# Patient Record
Sex: Male | Born: 1945 | Race: White | Hispanic: No | Marital: Married | Smoking: Never smoker
Health system: Southern US, Community
[De-identification: ages and names within clinical notes are randomized; demographics above are authoritative.]

## PROBLEM LIST (undated history)

## (undated) ENCOUNTER — Ambulatory Visit: Admission: EM | Payer: PRIVATE HEALTH INSURANCE | Source: Home / Self Care

## (undated) DIAGNOSIS — I10 Essential (primary) hypertension: Secondary | ICD-10-CM

## (undated) DIAGNOSIS — L570 Actinic keratosis: Secondary | ICD-10-CM

## (undated) DIAGNOSIS — G629 Polyneuropathy, unspecified: Secondary | ICD-10-CM

## (undated) DIAGNOSIS — E785 Hyperlipidemia, unspecified: Secondary | ICD-10-CM

## (undated) DIAGNOSIS — I255 Ischemic cardiomyopathy: Secondary | ICD-10-CM

## (undated) DIAGNOSIS — N183 Chronic kidney disease, stage 3 unspecified: Secondary | ICD-10-CM

## (undated) DIAGNOSIS — N200 Calculus of kidney: Secondary | ICD-10-CM

## (undated) DIAGNOSIS — K219 Gastro-esophageal reflux disease without esophagitis: Secondary | ICD-10-CM

## (undated) DIAGNOSIS — N320 Bladder-neck obstruction: Secondary | ICD-10-CM

## (undated) DIAGNOSIS — N529 Male erectile dysfunction, unspecified: Secondary | ICD-10-CM

## (undated) DIAGNOSIS — R351 Nocturia: Secondary | ICD-10-CM

## (undated) DIAGNOSIS — M778 Other enthesopathies, not elsewhere classified: Secondary | ICD-10-CM

## (undated) DIAGNOSIS — M1990 Unspecified osteoarthritis, unspecified site: Secondary | ICD-10-CM

## (undated) DIAGNOSIS — C61 Malignant neoplasm of prostate: Secondary | ICD-10-CM

## (undated) DIAGNOSIS — R32 Unspecified urinary incontinence: Secondary | ICD-10-CM

## (undated) DIAGNOSIS — R42 Dizziness and giddiness: Secondary | ICD-10-CM

## (undated) DIAGNOSIS — I82409 Acute embolism and thrombosis of unspecified deep veins of unspecified lower extremity: Secondary | ICD-10-CM

## (undated) DIAGNOSIS — C4491 Basal cell carcinoma of skin, unspecified: Secondary | ICD-10-CM

## (undated) DIAGNOSIS — H269 Unspecified cataract: Secondary | ICD-10-CM

## (undated) DIAGNOSIS — C679 Malignant neoplasm of bladder, unspecified: Secondary | ICD-10-CM

## (undated) DIAGNOSIS — Z86711 Personal history of pulmonary embolism: Secondary | ICD-10-CM

## (undated) DIAGNOSIS — I2510 Atherosclerotic heart disease of native coronary artery without angina pectoris: Secondary | ICD-10-CM

## (undated) DIAGNOSIS — E039 Hypothyroidism, unspecified: Secondary | ICD-10-CM

## (undated) DIAGNOSIS — Z973 Presence of spectacles and contact lenses: Secondary | ICD-10-CM

## (undated) DIAGNOSIS — IMO0001 Reserved for inherently not codable concepts without codable children: Secondary | ICD-10-CM

## (undated) HISTORY — DX: Basal cell carcinoma of skin, unspecified: C44.91

## (undated) HISTORY — DX: Actinic keratosis: L57.0

## (undated) HISTORY — PX: COLONOSCOPY: SHX174

## (undated) HISTORY — PX: PROSTATE CRYOABLATION: SUR358

---

## 2004-12-06 ENCOUNTER — Emergency Department: Payer: Self-pay | Admitting: Emergency Medicine

## 2006-03-21 ENCOUNTER — Other Ambulatory Visit: Payer: Self-pay

## 2006-03-24 ENCOUNTER — Ambulatory Visit: Payer: Self-pay | Admitting: Specialist

## 2007-01-15 ENCOUNTER — Ambulatory Visit: Payer: Self-pay | Admitting: Unknown Physician Specialty

## 2009-01-10 ENCOUNTER — Emergency Department: Payer: Self-pay | Admitting: Emergency Medicine

## 2012-03-18 DIAGNOSIS — R399 Unspecified symptoms and signs involving the genitourinary system: Secondary | ICD-10-CM | POA: Insufficient documentation

## 2012-03-18 DIAGNOSIS — N200 Calculus of kidney: Secondary | ICD-10-CM | POA: Insufficient documentation

## 2012-05-18 ENCOUNTER — Ambulatory Visit: Payer: Self-pay | Admitting: Unknown Physician Specialty

## 2012-09-19 HISTORY — PX: OTHER SURGICAL HISTORY: SHX169

## 2013-11-19 ENCOUNTER — Other Ambulatory Visit: Payer: Self-pay | Admitting: Urology

## 2013-12-02 ENCOUNTER — Encounter (HOSPITAL_BASED_OUTPATIENT_CLINIC_OR_DEPARTMENT_OTHER): Payer: Self-pay | Admitting: *Deleted

## 2013-12-02 NOTE — Progress Notes (Signed)
NPO AFTER MN. ARRIVE AT 0830. NEEDS ISTAT AND EKG. WILL TAKE PRILOSEC AM DOS W/ SIPS OF WATER.  PT AWARE OWER AT MAIN.

## 2013-12-06 ENCOUNTER — Encounter (HOSPITAL_BASED_OUTPATIENT_CLINIC_OR_DEPARTMENT_OTHER): Payer: Self-pay | Admitting: *Deleted

## 2013-12-06 ENCOUNTER — Encounter (HOSPITAL_BASED_OUTPATIENT_CLINIC_OR_DEPARTMENT_OTHER): Payer: Medicare PPO | Admitting: Anesthesiology

## 2013-12-06 ENCOUNTER — Encounter (HOSPITAL_COMMUNITY)
Admission: RE | Disposition: A | Discharge status: Home / Self Care | Payer: Self-pay | Source: Ambulatory Visit | Attending: Urology

## 2013-12-06 ENCOUNTER — Observation Stay (HOSPITAL_BASED_OUTPATIENT_CLINIC_OR_DEPARTMENT_OTHER)
Admission: RE | Admit: 2013-12-06 | Discharge: 2013-12-07 | Disposition: A | Discharge status: Home / Self Care | Payer: Medicare PPO | Source: Ambulatory Visit | Attending: Urology | Admitting: Urology

## 2013-12-06 ENCOUNTER — Ambulatory Visit (HOSPITAL_BASED_OUTPATIENT_CLINIC_OR_DEPARTMENT_OTHER): Payer: Medicare PPO | Admitting: Anesthesiology

## 2013-12-06 ENCOUNTER — Other Ambulatory Visit: Payer: Self-pay

## 2013-12-06 DIAGNOSIS — Z23 Encounter for immunization: Secondary | ICD-10-CM | POA: Insufficient documentation

## 2013-12-06 DIAGNOSIS — N393 Stress incontinence (female) (male): Secondary | ICD-10-CM | POA: Insufficient documentation

## 2013-12-06 DIAGNOSIS — C61 Malignant neoplasm of prostate: Secondary | ICD-10-CM | POA: Insufficient documentation

## 2013-12-06 DIAGNOSIS — M129 Arthropathy, unspecified: Secondary | ICD-10-CM | POA: Insufficient documentation

## 2013-12-06 DIAGNOSIS — I10 Essential (primary) hypertension: Secondary | ICD-10-CM | POA: Insufficient documentation

## 2013-12-06 DIAGNOSIS — Z7982 Long term (current) use of aspirin: Secondary | ICD-10-CM | POA: Insufficient documentation

## 2013-12-06 DIAGNOSIS — K219 Gastro-esophageal reflux disease without esophagitis: Secondary | ICD-10-CM | POA: Insufficient documentation

## 2013-12-06 DIAGNOSIS — N529 Male erectile dysfunction, unspecified: Principal | ICD-10-CM | POA: Insufficient documentation

## 2013-12-06 DIAGNOSIS — Z79899 Other long term (current) drug therapy: Secondary | ICD-10-CM | POA: Insufficient documentation

## 2013-12-06 HISTORY — DX: Male erectile dysfunction, unspecified: N52.9

## 2013-12-06 HISTORY — DX: Nocturia: R35.1

## 2013-12-06 HISTORY — DX: Hyperlipidemia, unspecified: E78.5

## 2013-12-06 HISTORY — DX: Essential (primary) hypertension: I10

## 2013-12-06 HISTORY — PX: PENILE PROSTHESIS IMPLANT: SHX240

## 2013-12-06 HISTORY — DX: Gastro-esophageal reflux disease without esophagitis: K21.9

## 2013-12-06 HISTORY — DX: Malignant neoplasm of prostate: C61

## 2013-12-06 LAB — POCT I-STAT, CHEM 8
BUN: 16 mg/dL (ref 6–23)
CHLORIDE: 106 meq/L (ref 96–112)
Calcium, Ion: 1.24 mmol/L (ref 1.13–1.30)
Creatinine, Ser: 1.1 mg/dL (ref 0.50–1.35)
GLUCOSE: 109 mg/dL — AB (ref 70–99)
HEMATOCRIT: 44 % (ref 39.0–52.0)
Hemoglobin: 15 g/dL (ref 13.0–17.0)
Potassium: 4 mEq/L (ref 3.7–5.3)
Sodium: 144 mEq/L (ref 137–147)
TCO2: 24 mmol/L (ref 0–100)

## 2013-12-06 SURGERY — INSERTION, PENILE PROSTHESIS, INFLATABLE
Anesthesia: General | Site: Penis

## 2013-12-06 MED ORDER — BENAZEPRIL HCL 20 MG PO TABS
20.0000 mg | ORAL_TABLET | Freq: Every day | ORAL | Status: DC
  Administered 2013-12-07: 20 mg via ORAL
  Filled 2013-12-06: qty 1

## 2013-12-06 MED ORDER — CEFAZOLIN SODIUM-DEXTROSE 2-3 GM-% IV SOLR
2.0000 g | INTRAVENOUS | Status: AC
  Administered 2013-12-06: 2 g via INTRAVENOUS
  Filled 2013-12-06: qty 50

## 2013-12-06 MED ORDER — SODIUM CHLORIDE 0.9 % IR SOLN
Status: DC | PRN
  Administered 2013-12-06: 11:00:00

## 2013-12-06 MED ORDER — ONDANSETRON HCL 4 MG/2ML IJ SOLN
INTRAMUSCULAR | Status: DC | PRN
  Administered 2013-12-06: 4 mg via INTRAVENOUS

## 2013-12-06 MED ORDER — ACETAMINOPHEN 10 MG/ML IV SOLN
INTRAVENOUS | Status: DC | PRN
  Administered 2013-12-06: 1000 mg via INTRAVENOUS

## 2013-12-06 MED ORDER — OXYCODONE-ACETAMINOPHEN 5-325 MG PO TABS: 1.0000 | ORAL_TABLET | Freq: Four times a day (QID) | ORAL | Status: DC | PRN

## 2013-12-06 MED ORDER — CIPROFLOXACIN HCL 500 MG PO TABS: 500.0000 mg | ORAL_TABLET | Freq: Two times a day (BID) | ORAL | Status: DC

## 2013-12-06 MED ORDER — ACETAMINOPHEN 325 MG PO TABS: 650.0000 mg | ORAL_TABLET | ORAL | Status: DC | PRN

## 2013-12-06 MED ORDER — STERILE WATER FOR IRRIGATION IR SOLN
Status: DC | PRN
  Administered 2013-12-06: 500 mL

## 2013-12-06 MED ORDER — GENTAMICIN SULFATE 40 MG/ML IJ SOLN
120.0000 mg | INTRAMUSCULAR | Status: DC
  Administered 2013-12-06: 120 mg via INTRAVENOUS
  Filled 2013-12-06: qty 3

## 2013-12-06 MED ORDER — MIDAZOLAM HCL 2 MG/2ML IJ SOLN
INTRAMUSCULAR | Status: AC
  Filled 2013-12-06: qty 2

## 2013-12-06 MED ORDER — FENTANYL CITRATE 0.05 MG/ML IJ SOLN
INTRAMUSCULAR | Status: DC | PRN
  Administered 2013-12-06: 25 ug via INTRAVENOUS
  Administered 2013-12-06: 50 ug via INTRAVENOUS
  Administered 2013-12-06: 100 ug via INTRAVENOUS
  Administered 2013-12-06 (×3): 25 ug via INTRAVENOUS

## 2013-12-06 MED ORDER — PROMETHAZINE HCL 25 MG/ML IJ SOLN
INTRAMUSCULAR | Status: AC
  Filled 2013-12-06: qty 1

## 2013-12-06 MED ORDER — DIPHENHYDRAMINE HCL 12.5 MG/5ML PO ELIX: 12.5000 mg | ORAL_SOLUTION | Freq: Four times a day (QID) | ORAL | Status: DC | PRN

## 2013-12-06 MED ORDER — PNEUMOCOCCAL VAC POLYVALENT 25 MCG/0.5ML IJ INJ
0.5000 mL | INJECTION | INTRAMUSCULAR | Status: AC
  Administered 2013-12-07: 0.5 mL via INTRAMUSCULAR
  Filled 2013-12-06 (×2): qty 0.5

## 2013-12-06 MED ORDER — AMLODIPINE BESYLATE 10 MG PO TABS
10.0000 mg | ORAL_TABLET | Freq: Every day | ORAL | Status: DC
  Administered 2013-12-07: 10 mg via ORAL
  Filled 2013-12-06: qty 1

## 2013-12-06 MED ORDER — SODIUM CHLORIDE 0.45 % IV SOLN
INTRAVENOUS | Status: DC
  Administered 2013-12-06: 50 mL/h via INTRAVENOUS

## 2013-12-06 MED ORDER — FENTANYL CITRATE 0.05 MG/ML IJ SOLN
25.0000 ug | INTRAMUSCULAR | Status: DC | PRN
  Administered 2013-12-06: 25 ug via INTRAVENOUS
  Filled 2013-12-06: qty 1

## 2013-12-06 MED ORDER — LACTATED RINGERS IV SOLN
INTRAVENOUS | Status: DC
  Administered 2013-12-06 (×2): via INTRAVENOUS
  Filled 2013-12-06: qty 1000

## 2013-12-06 MED ORDER — AMLODIPINE BESY-BENAZEPRIL HCL 10-20 MG PO CAPS: 1.0000 | ORAL_CAPSULE | Freq: Every morning | ORAL | Status: DC

## 2013-12-06 MED ORDER — PROMETHAZINE HCL 25 MG/ML IJ SOLN
6.2500 mg | INTRAMUSCULAR | Status: DC | PRN
  Administered 2013-12-06: 6.25 mg via INTRAVENOUS
  Filled 2013-12-06: qty 1

## 2013-12-06 MED ORDER — SODIUM CHLORIDE 0.9 % IV SOLN
INTRAVENOUS | Status: DC | PRN
  Administered 2013-12-06: 250 mL

## 2013-12-06 MED ORDER — CIPROFLOXACIN HCL 500 MG PO TABS
500.0000 mg | ORAL_TABLET | Freq: Two times a day (BID) | ORAL | Status: DC
  Administered 2013-12-06: 500 mg via ORAL
  Filled 2013-12-06 (×5): qty 1

## 2013-12-06 MED ORDER — PANTOPRAZOLE SODIUM 40 MG PO TBEC
40.0000 mg | DELAYED_RELEASE_TABLET | Freq: Every day | ORAL | Status: DC
  Administered 2013-12-07: 40 mg via ORAL
  Filled 2013-12-06: qty 1

## 2013-12-06 MED ORDER — DEXAMETHASONE SODIUM PHOSPHATE 10 MG/ML IJ SOLN
INTRAMUSCULAR | Status: DC | PRN
  Administered 2013-12-06: 10 mg via INTRAVENOUS

## 2013-12-06 MED ORDER — FENTANYL CITRATE 0.05 MG/ML IJ SOLN
INTRAMUSCULAR | Status: AC
  Filled 2013-12-06: qty 2

## 2013-12-06 MED ORDER — LIDOCAINE HCL 1 % IJ SOLN
INTRAMUSCULAR | Status: DC | PRN
  Administered 2013-12-06: 50 mg via INTRADERMAL

## 2013-12-06 MED ORDER — KETOROLAC TROMETHAMINE 30 MG/ML IJ SOLN
INTRAMUSCULAR | Status: DC | PRN
  Administered 2013-12-06: 30 mg via INTRAVENOUS

## 2013-12-06 MED ORDER — MIDAZOLAM HCL 5 MG/5ML IJ SOLN
INTRAMUSCULAR | Status: DC | PRN
  Administered 2013-12-06: 2 mg via INTRAVENOUS

## 2013-12-06 MED ORDER — BUPIVACAINE LIPOSOME 1.3 % IJ SUSP
INTRAMUSCULAR | Status: DC | PRN
  Administered 2013-12-06: 20 mL

## 2013-12-06 MED ORDER — BACITRACIN-NEOMYCIN-POLYMYXIN 400-5-5000 EX OINT: 1.0000 "application " | TOPICAL_OINTMENT | Freq: Three times a day (TID) | CUTANEOUS | Status: DC | PRN

## 2013-12-06 MED ORDER — KETOROLAC TROMETHAMINE 30 MG/ML IJ SOLN
15.0000 mg | Freq: Once | INTRAMUSCULAR | Status: DC | PRN
  Filled 2013-12-06: qty 1

## 2013-12-06 MED ORDER — ONDANSETRON HCL 4 MG/2ML IJ SOLN: 4.0000 mg | INTRAMUSCULAR | Status: DC | PRN

## 2013-12-06 MED ORDER — DIPHENHYDRAMINE HCL 50 MG/ML IJ SOLN: 12.5000 mg | Freq: Four times a day (QID) | INTRAMUSCULAR | Status: DC | PRN

## 2013-12-06 MED ORDER — PROPOFOL 10 MG/ML IV BOLUS
INTRAVENOUS | Status: DC | PRN
  Administered 2013-12-06: 18 mg via INTRAVENOUS

## 2013-12-06 MED ORDER — SENNA 8.6 MG PO TABS
1.0000 | ORAL_TABLET | Freq: Two times a day (BID) | ORAL | Status: DC
  Administered 2013-12-06 – 2013-12-07 (×2): 8.6 mg via ORAL
  Filled 2013-12-06 (×2): qty 1

## 2013-12-06 MED ORDER — OXYCODONE-ACETAMINOPHEN 5-325 MG PO TABS
1.0000 | ORAL_TABLET | ORAL | Status: DC | PRN
  Administered 2013-12-06 – 2013-12-07 (×2): 2 via ORAL
  Filled 2013-12-06 (×2): qty 2

## 2013-12-06 MED ORDER — HYDROMORPHONE HCL PF 1 MG/ML IJ SOLN
0.5000 mg | INTRAMUSCULAR | Status: DC | PRN
  Administered 2013-12-06: 1 mg via INTRAVENOUS
  Filled 2013-12-06: qty 1

## 2013-12-06 MED ORDER — FENTANYL CITRATE 0.05 MG/ML IJ SOLN
INTRAMUSCULAR | Status: AC
  Filled 2013-12-06: qty 8

## 2013-12-06 SURGICAL SUPPLY — 76 items
APPLICATOR COTTON TIP 6IN STRL (MISCELLANEOUS) IMPLANT
BAG DECANTER FOR FLEXI CONT (MISCELLANEOUS) ×3 IMPLANT
BAG URINE DRAINAGE (UROLOGICAL SUPPLIES) ×3 IMPLANT
BANDAGE CO FLEX L/F 2IN X 5YD (GAUZE/BANDAGES/DRESSINGS) ×3 IMPLANT
BANDAGE GAUZE ELAST BULKY 4 IN (GAUZE/BANDAGES/DRESSINGS) ×3 IMPLANT
BENZOIN TINCTURE PRP APPL 2/3 (GAUZE/BANDAGES/DRESSINGS) IMPLANT
BLADE HEX COATED 2.75 (ELECTRODE) ×3 IMPLANT
BLADE SURG 15 STRL LF DISP TIS (BLADE) ×2 IMPLANT
BLADE SURG 15 STRL SS (BLADE) ×4
BLADE SURG ROTATE 9660 (MISCELLANEOUS) ×6 IMPLANT
BNDG GAUZE ELAST 4 BULKY (GAUZE/BANDAGES/DRESSINGS) ×3 IMPLANT
BRIEF STRETCH FOR OB PAD LRG (UNDERPADS AND DIAPERS) ×3 IMPLANT
CANISTER SUCTION 1200CC (MISCELLANEOUS) IMPLANT
CANISTER SUCTION 2500CC (MISCELLANEOUS) ×3 IMPLANT
CATH FOLEY 2WAY SLVR  5CC 16FR (CATHETERS) ×2
CATH FOLEY 2WAY SLVR 5CC 16FR (CATHETERS) ×1 IMPLANT
CHLORAPREP W/TINT 26ML (MISCELLANEOUS) ×6 IMPLANT
CLOSURE WOUND 1/2 X4 (GAUZE/BANDAGES/DRESSINGS)
CLOTH BEACON ORANGE TIMEOUT ST (SAFETY) ×3 IMPLANT
COVER MAYO STAND STRL (DRAPES) ×6 IMPLANT
COVER TABLE BACK 60X90 (DRAPES) ×3 IMPLANT
DERMABOND ADVANCED (GAUZE/BANDAGES/DRESSINGS) ×2
DERMABOND ADVANCED .7 DNX12 (GAUZE/BANDAGES/DRESSINGS) ×1 IMPLANT
DISSECTOR ROUND CHERRY 3/8 STR (MISCELLANEOUS) ×3 IMPLANT
DRAPE EXTREMITY T 121X128X90 (DRAPE) ×3 IMPLANT
DRAPE INCISE IOBAN 66X45 STRL (DRAPES) ×3 IMPLANT
DRSG TEGADERM 4X4.75 (GAUZE/BANDAGES/DRESSINGS) ×3 IMPLANT
ELECT NEEDLE TIP 2.8 STRL (NEEDLE) ×3 IMPLANT
ELECT REM PT RETURN 9FT ADLT (ELECTROSURGICAL) ×3
ELECTRODE REM PT RTRN 9FT ADLT (ELECTROSURGICAL) ×1 IMPLANT
GLOVE BIO SURGEON STRL SZ 6 (GLOVE) ×3 IMPLANT
GLOVE BIO SURGEON STRL SZ 6.5 (GLOVE) ×2 IMPLANT
GLOVE BIO SURGEON STRL SZ7 (GLOVE) ×3 IMPLANT
GLOVE BIO SURGEON STRL SZ7.5 (GLOVE) ×6 IMPLANT
GLOVE BIO SURGEONS STRL SZ 6.5 (GLOVE) ×1
GLOVE INDICATOR 7.0 STRL GRN (GLOVE) ×3 IMPLANT
GLOVE INDICATOR 7.5 STRL GRN (GLOVE) ×3 IMPLANT
GOWN PREVENTION PLUS LG XLONG (DISPOSABLE) IMPLANT
GOWN STRL REIN XL XLG (GOWN DISPOSABLE) IMPLANT
GOWN STRL REUS W/ TWL LRG LVL3 (GOWN DISPOSABLE) ×2 IMPLANT
GOWN STRL REUS W/ TWL XL LVL3 (GOWN DISPOSABLE) ×1 IMPLANT
GOWN STRL REUS W/TWL LRG LVL3 (GOWN DISPOSABLE) ×4
GOWN STRL REUS W/TWL XL LVL3 (GOWN DISPOSABLE) ×2
HOOK RETRACTION 12 ELAST STAY (MISCELLANEOUS) IMPLANT
KIT TITAN ASSEMBLY (Erectile Restoration) ×2 IMPLANT
KIT TITAN ASSEMBLY STANDARD (Erectile Restoration) ×1 IMPLANT
KIT TITAN ASSEMBLY STD (Erectile Restoration) ×1 IMPLANT
NEEDLE ELECTRODE (NEEDLE) IMPLANT
NEEDLE HYPO 25X1 1.5 SAFETY (NEEDLE) ×3 IMPLANT
NS IRRIG 500ML POUR BTL (IV SOLUTION) ×3 IMPLANT
PACK BASIN DAY SURGERY FS (CUSTOM PROCEDURE TRAY) ×3 IMPLANT
PAD PREP 24X48 CUFFED NSTRL (MISCELLANEOUS) ×3 IMPLANT
PENCIL BUTTON HOLSTER BLD 10FT (ELECTRODE) ×3 IMPLANT
PLUG CATH AND CAP STER (CATHETERS) ×3 IMPLANT
PROS TITAN SCROT 0 ANG 18CM (Erectile Restoration) ×3 IMPLANT
PROSTHESIS TTN SCRO 0 ANG 18CM (Erectile Restoration) ×1 IMPLANT
RESERVOIR 75CC LOCKOUT BIOFLEX (Erectile Restoration) ×3 IMPLANT
RETRACTOR STAY HOOK 5MM (MISCELLANEOUS) IMPLANT
RETRACTOR WILSON SYSTEM (INSTRUMENTS) ×3 IMPLANT
SPONGE LAP 4X18 X RAY DECT (DISPOSABLE) ×3 IMPLANT
STRIP CLOSURE SKIN 1/2X4 (GAUZE/BANDAGES/DRESSINGS) IMPLANT
SURGILUBE 2OZ TUBE FLIPTOP (MISCELLANEOUS) ×3 IMPLANT
SUT VIC AB 2-0 UR6 27 (SUTURE) ×12 IMPLANT
SUT VIC AB 3-0 54XBRD REEL (SUTURE) ×1 IMPLANT
SUT VIC AB 3-0 BRD 54 (SUTURE) ×2
SUT VIC AB 3-0 PS2 18 (SUTURE) ×4
SUT VIC AB 3-0 PS2 18XBRD (SUTURE) ×2 IMPLANT
SYR 20CC LL (SYRINGE) ×6 IMPLANT
SYR 50ML LL SCALE MARK (SYRINGE) ×6 IMPLANT
SYR 5ML LL (SYRINGE) ×3 IMPLANT
SYR BULB IRRIGATION 50ML (SYRINGE) ×3 IMPLANT
SYR CONTROL 10ML LL (SYRINGE) ×3 IMPLANT
TOWEL OR 17X24 6PK STRL BLUE (TOWEL DISPOSABLE) ×6 IMPLANT
TRAY DSU PREP LF (CUSTOM PROCEDURE TRAY) ×3 IMPLANT
WATER STERILE IRR 500ML POUR (IV SOLUTION) ×3 IMPLANT
YANKAUER SUCT BULB TIP NO VENT (SUCTIONS) ×3 IMPLANT

## 2013-12-06 NOTE — Transfer of Care (Signed)
Immediate Anesthesia Transfer of Care Note  Patient: Phillip [name]  Procedure(s) Performed: Procedure(s): PENILE PROTHESIS INFLATABLE (N/A)  Patient Location: PACU  Anesthesia Type:General  Level of Consciousness: awake, alert , oriented and patient cooperative  Airway & Oxygen Therapy: Patient Spontanous Breathing and Patient connected to face mask oxygen  Post-op Assessment: Report given to PACU RN, Post -op Vital signs reviewed and stable and Patient moving all extremities  Post vital signs: Reviewed and stable  Complications: No apparent anesthesia complications

## 2013-12-06 NOTE — Op Note (Signed)
Pre-operative diagnosis :   Organic sexual dysfunction  Postoperative diagnosis:  Same  Operation:  Insertion of Coloplast Titan inflatable penile prosthesis  Surgeon:  S. Gaynelle Arabian, MD  First assistant:  Dr. Hanley Ben  Anesthesia:  General LMA  Preparation: After appropriate preanesthesia, the patient was brought to the operating room, placed on the operating table in the dorsal supine position where general LMA anesthesia was introduced. He remained in this position, where the pubis, penis, and scrotum, or were washed with Betadine solution for 10 minutes, then prepped with Betadine solution, and draped in usual fashion. The patient was given IV Ancef, as well as IV gentamicin per anesthesia. The arm band was double checked. The history was double checked. During the procedure, the patient was given IV Toradol, as well as IV Tylenol. He was noted to have a tiny red papule in the left lower quadrant, and this area was taped at of the operative field.  Review history:  Problems  1. 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  Pt was found to have T1c Prostate Cancer by bx at [facility] in July 2013, with PSA 8.3. No Gleason score noted. Pt underwent cryotherapy in September, 2013. PSA's have been < 1 since his surgery. His biggest problem since his surgery has been ED, failing vacuum pump and injections ( using 0.85cc with no result); and he saw Dr. Elnoria Howard in [place], with recommendation for IPP.      Statement of  Likelihood of Success: Excellent. TIME-OUT observed.:  Procedure:  16 Foley catheter was placed without difficulty. The bladder is drained of fluid. The figure-of-eight self-retaining retractor was employed, using blunt retractor needles. Using a marking pen, a 6 cm midline incision was  marked, and the penoscrotal junction area. Using a 15 blade, a skin incision was made in the midline, and subcutaneous tissue dissected with stromally scissors and the electrosurgical unit. With protection of the urethra, the corpora cavernosa was identified bilaterally. 2 Vicryl sutures, of 2-0 size, were used as stay sutures in the corpora cavernosum bilaterally. Bilateral corporotomies were then accomplished, with corporal dilation to a size 12 bilaterally. No corporal scarring was noted. The patient was felt to have a corporal length of 21.5 cm. Therefore a prosthesis length of 18 cm with 3.5 cm of rear-tip extension was selected. The cylinders were placed both distalward, and proximally without difficulty.  The reservoir was then placed through inguinal canal dissection, without difficulty. 70 cc were placed in the reservoir.  The scrotum was then dissected for the right scrotal pump, and the pump was placed in a pouch in the right hemiscrotum. It was easily palpable. The pump was then connected via straight connector to the reservoir, and the prosthesis was cycled. It was previously tested prior to connection as well. The prosthesis cycled normally, and the prosthesis extended to mid glans area. The corpora was closed bilaterally with running 2-0 Vicryl suture.  The scrotal wound was closed with interrupted and running 3-0 Vicryl suture. Sterile dressing was applied. The patient was awakened and taken to recovery room in good condition.

## 2013-12-06 NOTE — Interval H&P Note (Signed)
History and Physical Interval Note:  12/06/2013 10:00 AM  Phillip [name]  has presented today for surgery, with the diagnosis of Prostate Cancer, Erectile Dysfunction  The various methods of treatment have been discussed with the patient and family. After consideration of risks, benefits and other options for treatment, the patient has consented to  Procedure(s): [place] (N/A) as a surgical intervention .  The patient's history has been reviewed, patient examined, no change in status, stable for surgery.  I have reviewed the patient's chart and labs.  Questions were answered to the patient's satisfaction.     Carolan Clines I

## 2013-12-06 NOTE — H&P (Signed)
Prostate cancer & ED   Active Problems Problems  1. 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    Pt was found to have T1c Prostate Cancer by bx at [facility] in July 2013, with PSA 8.3. No Gleason score noted. Pt underwent cryotherapy in September, 2013. PSA's have been < 1 since his surgery. His biggest problem since his surgery has been ED, failing vacuum pump and injections ( using 0.85cc with no result); and he saw Dr. Elnoria Howard in [place], with recommendation for IPP.   Past Medical History Problems  1. History of arthritis (V13.4) 2. History of esophageal reflux (V12.79)  Surgical History Problems  1. History of Arthrodesis Thumb Carpometacarpal Joint 2. History of Prostate Surgery  Current Meds 1. AmLODIPine Besylate 5 MG Oral Tablet;  Therapy: (Recorded:17Feb2015) to Recorded 2. Aspirin 81 MG Oral Tablet;  Therapy: (Recorded:17Feb2015) to Recorded 3. Benazepril HCl - 20 MG Oral Tablet;  Therapy: (Recorded:17Feb2015) to Recorded 4. Herbal Energy Complex TABS;  Therapy: (Recorded:17Feb2015) to Recorded 5. Multivitamins TABS;  Therapy: (Recorded:17Feb2015) to Recorded Phillip. Niacin TABS;  Therapy: (Recorded:17Feb2015) to Recorded 7. Omeprazole 20 MG Oral Tablet Delayed Release;  Therapy: (Recorded:17Feb2015) to Recorded 8. Potassium Gluconate 595 MG Oral Tablet;  Therapy: (Recorded:17Feb2015) to Recorded  Allergies Medication  1. No Known Drug Allergies  Family History Problems  1. Family history of Death : Mother, Father 2. Family history of kidney stones (V18.69) : Brother 3. Family history of myocardial infarction (V17.3) : Mother  Social History Problems    Denied: History of Alcohol use   Married   No caffeine use   No tobacco/smoke exposure   Retired  Review  of Systems Genitourinary, constitutional, skin, eye, otolaryngeal, hematologic/lymphatic, cardiovascular, pulmonary, endocrine, musculoskeletal, gastrointestinal, neurological and psychiatric system(s) were reviewed and pertinent findings if present are noted.  Genitourinary: incontinence and erectile dysfunction, but no urinary frequency, no feelings of urinary urgency, no dysuria, no nocturia, no difficulty starting the urinary stream, urine stream is not weak, urinary stream does not start and stop, no incomplete emptying of bladder, no post-void dribbling, no hematuria and initiating urination does not require straining.    Vitals Vital Signs [Data Includes: Last 1 Day]  Recorded: [id number] 01:59PM  Blood Pressure: 117 / 82 Temperature: 97.4 F Heart Rate: 82  Physical Exam Constitutional: Well nourished and well developed . No acute distress.  ENT:. The ears and nose are normal in appearance.  Neck: The appearance of the neck is normal and no neck mass is present.  Pulmonary: No respiratory distress and normal respiratory rhythm and effort.  Cardiovascular: Heart rate and rhythm are normal . No peripheral edema.  Abdomen: The abdomen is soft and nontender. No masses are palpated. No CVA tenderness. No hernias are palpable. No hepatosplenomegaly noted.  Rectal: Rectal exam demonstrates normal sphincter tone. The prostate is smooth and flat. The prostate has no nodularity, is not indurated, is not tender and is not fluctuant. The left seminal vesicle is nonpalpable. The right seminal vesicle is nonpalpable. The perineum is normal on inspection.  Genitourinary: Examination of the penis demonstrates no discharge, no masses, no lesions and a normal meatus. The penis is uncircumcised. The scrotum is normal in appearance and without lesions. The right epididymis is palpably normal and non-tender. The left epididymis  is palpably normal and non-tender. The right testis is non-tender and without masses.  The left testis is non-tender and without masses.  Lymphatics: The femoral and inguinal nodes are not enlarged or tender.  Skin: Normal skin turgor, no visible rash and no visible skin lesions.  Neuro/Psych:. Mood and affect are appropriate.    Results/Data Urine [Data Includes: Last 1 Day]   [id number]  COLOR YELLOW   APPEARANCE CLEAR   SPECIFIC GRAVITY 1.015   pH Phillip.0   GLUCOSE NEG mg/dL  BILIRUBIN NEG   KETONE NEG mg/dL  BLOOD NEG   PROTEIN NEG mg/dL  UROBILINOGEN 0.2 mg/dL  NITRITE NEG   LEUKOCYTE ESTERASE NEG    Assessment Assessed  1. 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  68 yo Phillip [name] with ED following his cryotherapy. No ejaculation. He had decreased erections prior to his cryotherapy. He has some stress incontinence, wearing a pad since his cryotherapy. He will probably need additional medication for the glans. He is concerned re: refractory time. He gets h/a with Viagra    1 Amended By: Carolan Clines; Nov 05 2013 3:01 PM EST  Plan 1. release of Information from Dr. Baldo Daub at [facility]   2. Information re: IPP.   3. Sxhedule IPP   Discussion/Summary cc: Dr. Emily Filbert, [place], [place]     Signatures Electronically signed by : Carolan Clines, M.D.; Nov 05 2013  3:00PM EST Electronically signed by : Carolan Clines, M.D.; Nov 05 2013  3:01PM EST

## 2013-12-06 NOTE — Discharge Instructions (Signed)
Penile Prosthesis Implantation Penile prosthesis implantation is a procedure to implant semirigid or inflatable devices into the penis as a treatment for erectile dysfunction. There are two main types of implants used for the surgery.  Malleable penile implant (also known as nonhydraulic or semirigid implant).  This consists of two silicone rubber rods.  These help by providing a certain amount of rigidity.  They are also flexible, so the penis can either be curved downward in the normal position or put into an erect position for intercourse.  Inflatable penile implant (also known as hydraulic implant).  This consists of cylinders, a pump, and a reservoir.  There are different types of inflatable implants. Each has advantages and disadvantages.  The cylinders can be inflated with fluid that helps in having an erection and can then be deflated after intercourse. LET [facility] CARE PROVIDER KNOW ABOUT:  Any allergies that you have.  All medicines you are taking, including vitamins, herbs, eye drops, creams, and over-the-counter medicines.  Previous problems you or members of your family have had with the use of anesthetics.  Any blood disorders you have.  Previous surgeries you have had.  Medical conditions you have. RISKS AND COMPLICATIONS  Generally, penile prosthesis implantation is a safe procedure. However, as with any procedure, complications can occur. Possible complications include:  Your urethra may be injured during the procedure.  You may develop an infection in your penis. Your prosthesis may need to be removed to treat the infection.  In very rare cases, the blood flow to your penis may become compromised, and the prosthesis would need to be removed. In people with diabetes mellitus, this may result in partial loss of penile tissue. BEFORE THE PROCEDURE   You may be asked to shower with a special soap the night or morning before surgery.  You may be given  intravenous antibiotics one hour before the surgery.  Hair will be removed from your surgical site and the site will be thoroughly cleansed.  You may be given regional anesthetic or general anesthetic.  On the day of the surgery, a Foley catheter (a soft, thin, flexible rubber tube) may be passed through the urethra (urinary passage) into the bladder.  The tube will drain urine.  The catheter will also help in guiding your surgeon to identify the urethra during the procedure. PROCEDURE  A small incision is made in the scrotum or in the penis just below the head.  The cylinders of the prosthesis are inserted by the surgeon into the erectile tissue of the penis.  When an inflatable prosthesis is inserted, additional small incisions are made in the abdomen and in the scrotum to insert the pump and the reservoir of the inflatable device.  The cylinders, reservoir (filled with fluid), and pump will be joined by tubes and tested before your incisions are closed.  The incisions are closed using dissolvable sutures. AFTER THE PROCEDURE   After the procedure, you will be taken to the recovery area, where a nurse will watch you and check your progress. Once you are awake, stable, and taking fluids well, without other problems, you will be allowed to go home.  The surgeon may prescribe medicines to ease pain.  You may also be given antibiotics.  You may be put on a clear liquid diet for the first 24 hours.  You may be asked to walk or sit instead of lying down. It is helpful to avoid long periods of immobility.  A towel roll may be  placed under your scrotum to help reduce swelling.  The Foley catheter may be removed in the morning after surgery.  A sterile dressing will be applied to the incision site. You may have a scrotal support. This elevates the scrotum, relieving pressure on the incision site. In those cases in which the scrotal support irritates the incision site, you may remove  the support. It is okay if the dressing comes off, especially at night. Air will help a scab to form, which will eliminate the need for dressings during the day. Document Released: 12/13/2007 Document Revised: 05/08/2013 Document Reviewed: 02/06/2013 [facility] Patient Information 2014 [place], [place].

## 2013-12-06 NOTE — Anesthesia Postprocedure Evaluation (Signed)
  Anesthesia Post-op Note  Patient: Phillip [name]  Procedure(s) Performed: Procedure(s) (LRB): PENILE PROTHESIS INFLATABLE (N/A)  Patient Location: PACU  Anesthesia Type: General  Level of Consciousness: awake and alert   Airway and Oxygen Therapy: Patient Spontanous Breathing  Post-op Pain: mild  Post-op Assessment: Post-op Vital signs reviewed, Patient's Cardiovascular Status Stable, Respiratory Function Stable, Patent Airway and No signs of Nausea or vomiting  Last Vitals:  Filed Vitals:   12/06/13 1330  BP: 135/83  Pulse: 59  Temp: 36.2 C  Resp: 10    Post-op Vital Signs: stable   Complications: No apparent anesthesia complications

## 2013-12-06 NOTE — Anesthesia Preprocedure Evaluation (Addendum)
Anesthesia Evaluation  Patient identified by MRN, date of birth, ID band Patient awake    Reviewed: Allergy & Precautions, H&P , NPO status , Patient's Chart, lab work & pertinent test results  Airway Mallampati: III TM Distance: <3 FB Neck ROM: Full    Dental no notable dental hx.    Pulmonary neg pulmonary ROS,  breath sounds clear to auscultation  Pulmonary exam normal       Cardiovascular hypertension, Pt. on medications Rhythm:Regular Rate:Normal     Neuro/Psych negative neurological ROS  negative psych ROS   GI/Hepatic Neg liver ROS, GERD-  Medicated,  Endo/Other  negative endocrine ROS  Renal/GU negative Renal ROS  negative genitourinary   Musculoskeletal negative musculoskeletal ROS (+)   Abdominal   Peds negative pediatric ROS (+)  Hematology negative hematology ROS (+)   Anesthesia Other Findings   Reproductive/Obstetrics negative OB ROS                          Anesthesia Physical Anesthesia Plan  ASA: II  Anesthesia Plan: General   Post-op Pain Management:    Induction: Intravenous  Airway Management Planned: LMA  Additional Equipment:   Intra-op Plan:   Post-operative Plan: Extubation in OR  Informed Consent: I have reviewed the patients History and Physical, chart, labs and discussed the procedure including the risks, benefits and alternatives for the proposed anesthesia with the patient or authorized representative who has indicated his/her understanding and acceptance.   Dental advisory given  Plan Discussed with: CRNA and Surgeon  Anesthesia Plan Comments:         Anesthesia Quick Evaluation

## 2013-12-07 NOTE — Progress Notes (Signed)
Discharge to home, wife at bedside, d/c instructions done and was given to the patient. Penile incision  Dressing dry and intact still pinkish and a little bit swollen.Patient did not complain of any pain upon discharge.

## 2013-12-07 NOTE — Progress Notes (Signed)
Looks good  Vitals OK Minimal pain and swelling Home with keflex and vicoden based on allergies

## 2013-12-10 ENCOUNTER — Encounter (HOSPITAL_BASED_OUTPATIENT_CLINIC_OR_DEPARTMENT_OTHER): Payer: Self-pay | Admitting: Urology

## 2013-12-12 NOTE — Discharge Summary (Signed)
  Physician Discharge Summary  Patient ID: Phillip [name] MRN: [id number] DOB/AGE: 1945-12-23 68 y.o.  Admit date: 12/06/2013 Discharge date: 12/12/2013  Admission Diagnoses: Prostate Cancer, Erectile Dysfunction  Review history: Problems  1. 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  Pt was found to have T1c Prostate Cancer by bx at [facility] in July 2013, with PSA 8.3. No Gleason score noted. Pt underwent cryotherapy in September, 2013. PSA's have been < 1 since his surgery. His biggest problem since his surgery has been ED, failing vacuum pump and injections ( using 0.85cc with no result); and he saw Dr. Elnoria Howard in [place], with recommendation for IPP.   Discharge Diagnoses:  Same  Discharged Condition: Stable  Hospital Course:  surgery  Consults: None  Significant Diagnostic Studies: No results found.  Treatments: IV hydration  Discharge Exam: Blood pressure 119/67, pulse 67, temperature 98.1 F (36.7 C), temperature source Oral, resp. rate 18, height 5\' 9"  (1.753 m), weight 83.915 kg (185 lb), SpO2 97.00%. General appearance: alert and cooperative Head: atraumatic GU: minimal edema. Minimal bruising. Cath out + void.  Disposition: 01-Home or Self Care     Medication List    STOP taking these medications       aspirin EC 81 MG tablet      TAKE these medications       amLODipine-benazepril 10-20 MG per capsule  Commonly known as:  LOTREL  Take 1 capsule by mouth every morning.     ciprofloxacin 500 MG tablet  Commonly known as:  CIPRO  Take 1 tablet (500 mg total) by mouth 2 (two) times daily.     multivitamin tablet  Take 1 tablet by mouth daily.     niacin 500 MG tablet  Take 500 mg by mouth at bedtime.     omeprazole 20 MG capsule  Commonly known as:  PRILOSEC  Take 20 mg by mouth every morning.     oxyCODONE-acetaminophen  5-325 MG per tablet  Commonly known as:  ROXICET  Take 1 tablet by mouth every 6 (six) hours as needed for severe pain.           Follow-up Information   Follow up with Carolan Clines I, MD. (per appointment)    Specialty:  Urology   Contact information:   [facility]  [place] [address] [phone]       Follow up with Carolan Clines I, MD. (as scheduled)    Specialty:  Urology   Contact information:   [facility]  [place] [address] [phone]       Signed: Carolan Clines I 12/12/2013, 10:21 AM

## 2014-10-25 ENCOUNTER — Emergency Department: Payer: Self-pay | Admitting: Emergency Medicine

## 2014-10-25 DIAGNOSIS — R739 Hyperglycemia, unspecified: Secondary | ICD-10-CM | POA: Diagnosis not present

## 2014-10-25 DIAGNOSIS — N281 Cyst of kidney, acquired: Secondary | ICD-10-CM | POA: Diagnosis not present

## 2014-10-25 DIAGNOSIS — N23 Unspecified renal colic: Secondary | ICD-10-CM | POA: Diagnosis not present

## 2014-10-25 DIAGNOSIS — Z7982 Long term (current) use of aspirin: Secondary | ICD-10-CM | POA: Diagnosis not present

## 2014-10-25 DIAGNOSIS — I10 Essential (primary) hypertension: Secondary | ICD-10-CM | POA: Diagnosis not present

## 2014-10-25 DIAGNOSIS — N200 Calculus of kidney: Secondary | ICD-10-CM | POA: Diagnosis not present

## 2014-10-25 DIAGNOSIS — Z79899 Other long term (current) drug therapy: Secondary | ICD-10-CM | POA: Diagnosis not present

## 2014-10-25 LAB — CBC
HCT: 40.9 % (ref 40.0–52.0)
HGB: 12.7 g/dL — AB (ref 13.0–18.0)
MCH: 27.8 pg (ref 26.0–34.0)
MCHC: 31.1 g/dL — AB (ref 32.0–36.0)
MCV: 89 fL (ref 80–100)
PLATELETS: 188 10*3/uL (ref 150–440)
RBC: 4.57 10*6/uL (ref 4.40–5.90)
RDW: 15.1 % — ABNORMAL HIGH (ref 11.5–14.5)
WBC: 10 10*3/uL (ref 3.8–10.6)

## 2014-10-25 LAB — URINALYSIS, COMPLETE
Bacteria: NONE SEEN
Bilirubin,UR: NEGATIVE
Blood: NEGATIVE
Glucose,UR: >=500 mg/dL (ref 0–75)
Ketone: NEGATIVE
Leukocyte Esterase: NEGATIVE
Nitrite: NEGATIVE
PH: 6 (ref 4.5–8.0)
Protein: NEGATIVE
Specific Gravity: 1.016 (ref 1.003–1.030)
Squamous Epithelial: NONE SEEN
WBC UR: 1 /HPF (ref 0–5)

## 2014-10-25 LAB — COMPREHENSIVE METABOLIC PANEL
ALBUMIN: 3.2 g/dL — AB (ref 3.4–5.0)
ALK PHOS: 58 U/L (ref 46–116)
ALT: 22 U/L (ref 14–63)
AST: 20 U/L (ref 15–37)
Anion Gap: 6 — ABNORMAL LOW (ref 7–16)
BUN: 15 mg/dL (ref 7–18)
Bilirubin,Total: 0.2 mg/dL (ref 0.2–1.0)
CHLORIDE: 109 mmol/L — AB (ref 98–107)
Calcium, Total: 8.2 mg/dL — ABNORMAL LOW (ref 8.5–10.1)
Co2: 26 mmol/L (ref 21–32)
Creatinine: 1.32 mg/dL — ABNORMAL HIGH (ref 0.60–1.30)
EGFR (Non-African Amer.): 58 — ABNORMAL LOW
Glucose: 210 mg/dL — ABNORMAL HIGH (ref 65–99)
Osmolality: 288 (ref 275–301)
Potassium: 4 mmol/L (ref 3.5–5.1)
Sodium: 141 mmol/L (ref 136–145)
TOTAL PROTEIN: 7 g/dL (ref 6.4–8.2)

## 2014-10-26 DIAGNOSIS — I10 Essential (primary) hypertension: Secondary | ICD-10-CM | POA: Diagnosis not present

## 2014-10-26 DIAGNOSIS — N23 Unspecified renal colic: Secondary | ICD-10-CM | POA: Diagnosis not present

## 2014-10-26 DIAGNOSIS — R739 Hyperglycemia, unspecified: Secondary | ICD-10-CM | POA: Diagnosis not present

## 2014-10-26 DIAGNOSIS — N200 Calculus of kidney: Secondary | ICD-10-CM | POA: Diagnosis not present

## 2014-10-26 DIAGNOSIS — Z79899 Other long term (current) drug therapy: Secondary | ICD-10-CM | POA: Diagnosis not present

## 2014-10-26 DIAGNOSIS — Z7982 Long term (current) use of aspirin: Secondary | ICD-10-CM | POA: Diagnosis not present

## 2014-10-26 DIAGNOSIS — N281 Cyst of kidney, acquired: Secondary | ICD-10-CM | POA: Diagnosis not present

## 2014-10-27 DIAGNOSIS — N200 Calculus of kidney: Secondary | ICD-10-CM | POA: Diagnosis not present

## 2014-10-27 DIAGNOSIS — Z8546 Personal history of malignant neoplasm of prostate: Secondary | ICD-10-CM | POA: Insufficient documentation

## 2014-10-28 DIAGNOSIS — R800 Isolated proteinuria: Secondary | ICD-10-CM | POA: Diagnosis not present

## 2014-10-28 DIAGNOSIS — N281 Cyst of kidney, acquired: Secondary | ICD-10-CM | POA: Diagnosis not present

## 2014-10-28 DIAGNOSIS — R109 Unspecified abdominal pain: Secondary | ICD-10-CM | POA: Diagnosis not present

## 2014-10-28 DIAGNOSIS — N200 Calculus of kidney: Secondary | ICD-10-CM | POA: Diagnosis not present

## 2014-11-12 DIAGNOSIS — N529 Male erectile dysfunction, unspecified: Secondary | ICD-10-CM | POA: Diagnosis not present

## 2014-11-19 DIAGNOSIS — E291 Testicular hypofunction: Secondary | ICD-10-CM | POA: Diagnosis not present

## 2014-11-19 DIAGNOSIS — N200 Calculus of kidney: Secondary | ICD-10-CM | POA: Diagnosis not present

## 2014-11-19 DIAGNOSIS — C61 Malignant neoplasm of prostate: Secondary | ICD-10-CM | POA: Diagnosis not present

## 2014-11-19 DIAGNOSIS — N529 Male erectile dysfunction, unspecified: Secondary | ICD-10-CM | POA: Diagnosis not present

## 2014-11-19 DIAGNOSIS — N320 Bladder-neck obstruction: Secondary | ICD-10-CM | POA: Diagnosis not present

## 2014-11-26 DIAGNOSIS — M65342 Trigger finger, left ring finger: Secondary | ICD-10-CM | POA: Diagnosis not present

## 2014-11-26 DIAGNOSIS — M25521 Pain in right elbow: Secondary | ICD-10-CM | POA: Diagnosis not present

## 2014-11-26 DIAGNOSIS — M65332 Trigger finger, left middle finger: Secondary | ICD-10-CM | POA: Diagnosis not present

## 2014-12-17 DIAGNOSIS — R350 Frequency of micturition: Secondary | ICD-10-CM | POA: Diagnosis not present

## 2014-12-17 DIAGNOSIS — R3915 Urgency of urination: Secondary | ICD-10-CM | POA: Diagnosis not present

## 2015-01-07 DIAGNOSIS — M65342 Trigger finger, left ring finger: Secondary | ICD-10-CM | POA: Diagnosis not present

## 2015-01-07 DIAGNOSIS — M65332 Trigger finger, left middle finger: Secondary | ICD-10-CM | POA: Diagnosis not present

## 2015-01-09 DIAGNOSIS — M25421 Effusion, right elbow: Secondary | ICD-10-CM | POA: Diagnosis not present

## 2015-01-09 DIAGNOSIS — M25521 Pain in right elbow: Secondary | ICD-10-CM | POA: Diagnosis not present

## 2015-01-09 DIAGNOSIS — M65342 Trigger finger, left ring finger: Secondary | ICD-10-CM | POA: Diagnosis not present

## 2015-01-09 DIAGNOSIS — M65332 Trigger finger, left middle finger: Secondary | ICD-10-CM | POA: Diagnosis not present

## 2015-01-13 DIAGNOSIS — N320 Bladder-neck obstruction: Secondary | ICD-10-CM | POA: Diagnosis not present

## 2015-01-23 DIAGNOSIS — Z23 Encounter for immunization: Secondary | ICD-10-CM | POA: Diagnosis not present

## 2015-02-12 DIAGNOSIS — J321 Chronic frontal sinusitis: Secondary | ICD-10-CM | POA: Diagnosis not present

## 2015-02-20 DIAGNOSIS — Z23 Encounter for immunization: Secondary | ICD-10-CM | POA: Diagnosis not present

## 2015-03-04 DIAGNOSIS — N320 Bladder-neck obstruction: Secondary | ICD-10-CM | POA: Diagnosis not present

## 2015-03-10 ENCOUNTER — Other Ambulatory Visit: Payer: Self-pay | Admitting: Urology

## 2015-03-13 DIAGNOSIS — Z0000 Encounter for general adult medical examination without abnormal findings: Secondary | ICD-10-CM | POA: Diagnosis not present

## 2015-03-13 DIAGNOSIS — I10 Essential (primary) hypertension: Secondary | ICD-10-CM | POA: Diagnosis not present

## 2015-03-13 DIAGNOSIS — Z125 Encounter for screening for malignant neoplasm of prostate: Secondary | ICD-10-CM | POA: Diagnosis not present

## 2015-03-13 DIAGNOSIS — E785 Hyperlipidemia, unspecified: Secondary | ICD-10-CM | POA: Diagnosis not present

## 2015-03-18 ENCOUNTER — Encounter (HOSPITAL_BASED_OUTPATIENT_CLINIC_OR_DEPARTMENT_OTHER): Payer: Self-pay | Admitting: *Deleted

## 2015-03-18 NOTE — Progress Notes (Signed)
NPO AFTER MN.  ARRIVE AT 0830.  NEEDS ISTAT AND EKG.  WILL TAKE PRILOSEC AM DOS W/ SIPS OF WATER .

## 2015-03-19 NOTE — H&P (Signed)
Reason For Visit Cystoscopy & prostate u/s   Active Problems Problems  1. Bilateral kidney stones (N20.0)   Assessed By: Carolan Clines (Urology); Last Assessed: 19 Nov 2014 2. 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 Hypogonadism, testicular (E29.1)   Assessed By: Carolan Clines (Urology); Last Assessed: 19 Nov 2014 4. Malignant neoplasm of prostate (C61)   Assessed By: Carolan Clines (Urology); Last Assessed: 19 Nov 2014   Last Impression: 05 Nov 2013  Cyroablation 2013 Dr. Robbie Lis at [facility] [place] (Urology) 5. 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 Hx of prostate cancer, treated with cryotherapy in September 2013 at [facility]. In addition, the patient has a history of ED, treated with Coloplast IPP in 2015. He has hypogonadism, on testosterone injections. 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 He voids by Cred, allowing more time to. His last evaluation was 3 years ago.    Video urodynamics on 12/17/14, is accomplished in the sitting position. His first sensation of fill occurs at 51 cc with normal desire at 99 cc. He has strong desire at 125 cc. His bladder becomes unstable, with the first contraction occurring at 97 cc. Maximum unstable bladder contraction pressure is 1570 was a water during this filling phase, and 42 cm of water during his voiding with urgency. The patient was able to inhibit these low amplitude unstable contractions without leaking. Leak point pressure determination is accomplished, and shows that the patient does not leak at the maximum abdominal pressure generation of 143 7 m of water.    Pressure flow study: The maximum flow rate is 5 cc/s, with detrusor pressure at maximum flow of 32 cm of water, and maximum detrusor pressure of 39 cm of water. The PVR  is 20-30 cc. He does void with an obstructed flow pattern, however.    EMG activity is normal. VCUG shows no bony abnormality. There is no reflux. Small bladder diverticula are noted.   Past Medical History Problems  1. History of arthritis (Z87.39) 2. History of esophageal reflux (Z87.19)  Surgical History Problems  1. History of Arthrodesis Thumb Carpometacarpal Joint 2. History of Prostate Surgery 3. History of Surg Penis Insertion Of Penile Prosthesis  Current Meds 1. AmLODIPine Besylate 5 MG Oral Tablet;  Therapy: (Recorded:17Feb2015) to Recorded 2. Aspirin 81 MG TABS;  Therapy: (Recorded:17Feb2015) to Recorded 3. BD Luer-Lok Syringe 22G X 1-1/2" 3 ML Miscellaneous; USE AS  DIRECTED;  Therapy: [id number] to (Evaluate:01Aug2016)  Requested for:  [id number]; Last Rx:03May2016 Ordered 4. Benazepril HCl - 20 MG Oral Tablet;  Therapy: (Recorded:17Feb2015) to Recorded 5. Cialis 20 MG Oral Tablet; TAKE 1 TABLET As Directed;  Therapy: [phone] to (Last Rx:09Feb2016) Ordered 6. Diazepam 10 MG Oral Tablet; Take tablet 1 hour prior to procedure;  Therapy: 26Apr2016 to (Last Rx:26Apr2016) Ordered 7. Multivitamins TABS;  Therapy: (Recorded:17Feb2015) to Recorded 8. Needle Tubing Adapter 18Gx1/2" Miscellaneous; use as directed to draw  medication;  Therapy: [id number] to (Last MQ:[id number])  Requested for: [id number]  Ordered 9. Niacin TABS;  Therapy: (Recorded:17Feb2015) to Recorded 10. Omeprazole 20 MG Oral Tablet Delayed Release;   Therapy: (Recorded:17Feb2015) to Recorded 11. Syringe 22G X 1-1/2" 3 ML Miscellaneous; USE AS DIRECTED;   Therapy: 23Dec2015 to (Last Rx:23Dec2015)  Requested for: 23Dec2015   Ordered 12.  Tamsulosin HCl - 0.4 MG Oral Capsule;   Therapy: [id number] to Recorded 13. Testosterone Cypionate 200 MG/ML Intramuscular Solution; INJECT 1  ML   Intramuscular Every 2 wks;   Therapy: [id number] to (Last Rx:15Dec2015) Ordered 14. Yale Disp Needles 18G X 1"  Miscellaneous; use as directed to draw   medication;   Therapy: [id number] to (Evaluate:01Aug2016)  Requested for:   [id number]; Last Rx:03May2016 Ordered  Allergies Medication  1. No Known Drug Allergies  Family History Problems  1. Family history of Death : Mother, Father 2. Family history of kidney stones (Z84.1) : Brother 3. Family history of myocardial infarction (Z82.49) : Mother  Social History Problems  1. Denied: History of Alcohol use 2. Married 3. Never smoker 4. No caffeine use 5. No tobacco/smoke exposure 6. Retired  Review of Systems Genitourinary, constitutional, skin, eye, otolaryngeal, hematologic/lymphatic, cardiovascular, pulmonary, endocrine, musculoskeletal, gastrointestinal, neurological and psychiatric system(s) were reviewed and pertinent findings if present are noted and are otherwise negative.    Vitals Vital Signs [Data Includes: Last 1 Day]  Recorded: [id number] 04:09PM  Blood Pressure: 132 / 79 Temperature: 98.1 F Heart Rate: 63  Physical Exam Constitutional: Well nourished and well developed . No acute distress.  ENT:. The ears and nose are normal in appearance.  Neck: The appearance of the neck is normal and no neck mass is present.  Pulmonary: No respiratory distress and normal respiratory rhythm and effort.  Abdomen: The abdomen is soft and nontender. No masses are palpated. No CVA tenderness. No hernias are palpable. No hepatosplenomegaly noted.  Genitourinary: Examination of the penis demonstrates no discharge, no masses, no lesions and a normal meatus. The scrotum is without lesions. The right epididymis is palpably normal and non-tender. The left epididymis is palpably normal and non-tender. The right testis is non-tender and without masses. The left testis is non-tender and without masses.  Lymphatics: The femoral and inguinal nodes are not enlarged or tender.  Skin: Normal skin turgor, no visible rash and no visible skin lesions.   Neuro/Psych:. Mood and affect are appropriate.    Procedure  Procedure: Cystoscopy  Chaperone Present: laura.  Indication: Lower Urinary Tract Symptoms.  Informed Consent: Risks, benefits, and potential adverse events were discussed and informed consent was obtained from the patient.  Prep: The patient was prepped with betadine.  Anesthesia:. Local anesthesia was administered intraurethrally with 2% lidocaine jelly.  Antibiotic prophylaxis: Ciprofloxacin.  Procedure Note:  Urethral meatus:. No abnormalities.  Anterior urethra: No abnormalities.  Prostatic urethra:. There was visual obstruction of the prostatic urethra. No intravesical median lobe was visualized. A bladder neck contracture was present.  Bladder: Visulization was clear. The ureteral orifices were in the normal anatomic position bilaterally. A systematic survey of the bladder demonstrated no bladder tumors or stones. Examination of the bladder demonstrated trabeculation, but no diverticulum cellules. The patient tolerated the procedure well.  Complications: None.    Assessment Assessed  1. 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   Amendment  Prostate ultrasound: Length 3.82 cm height 3.34 cm with 3.98 cm volume 26.61 cc.1     1 Amended By: Carolan Clines; Mar 06 2015 8:56 AM EST  Signatures Electronically signed by : Carolan Clines, M.D.; Mar 06 2015  8:56AM EST

## 2015-03-20 ENCOUNTER — Encounter (HOSPITAL_BASED_OUTPATIENT_CLINIC_OR_DEPARTMENT_OTHER)
Admission: RE | Disposition: A | Discharge status: Home / Self Care | Payer: Self-pay | Source: Ambulatory Visit | Attending: Urology

## 2015-03-20 ENCOUNTER — Other Ambulatory Visit: Payer: Self-pay

## 2015-03-20 ENCOUNTER — Encounter (HOSPITAL_BASED_OUTPATIENT_CLINIC_OR_DEPARTMENT_OTHER): Payer: Self-pay | Admitting: *Deleted

## 2015-03-20 ENCOUNTER — Ambulatory Visit (HOSPITAL_BASED_OUTPATIENT_CLINIC_OR_DEPARTMENT_OTHER)
Admission: RE | Admit: 2015-03-20 | Discharge: 2015-03-20 | Disposition: A | Discharge status: Home / Self Care | Payer: Commercial Managed Care - HMO | Source: Ambulatory Visit | Attending: Urology | Admitting: Urology

## 2015-03-20 ENCOUNTER — Ambulatory Visit (HOSPITAL_BASED_OUTPATIENT_CLINIC_OR_DEPARTMENT_OTHER): Payer: Commercial Managed Care - HMO | Admitting: Anesthesiology

## 2015-03-20 DIAGNOSIS — Z87442 Personal history of urinary calculi: Secondary | ICD-10-CM | POA: Diagnosis not present

## 2015-03-20 DIAGNOSIS — M1990 Unspecified osteoarthritis, unspecified site: Secondary | ICD-10-CM | POA: Insufficient documentation

## 2015-03-20 DIAGNOSIS — K219 Gastro-esophageal reflux disease without esophagitis: Secondary | ICD-10-CM | POA: Insufficient documentation

## 2015-03-20 DIAGNOSIS — E291 Testicular hypofunction: Secondary | ICD-10-CM | POA: Insufficient documentation

## 2015-03-20 DIAGNOSIS — N323 Diverticulum of bladder: Secondary | ICD-10-CM | POA: Diagnosis not present

## 2015-03-20 DIAGNOSIS — I10 Essential (primary) hypertension: Secondary | ICD-10-CM | POA: Diagnosis not present

## 2015-03-20 DIAGNOSIS — N3289 Other specified disorders of bladder: Secondary | ICD-10-CM | POA: Diagnosis not present

## 2015-03-20 DIAGNOSIS — Z8546 Personal history of malignant neoplasm of prostate: Secondary | ICD-10-CM | POA: Diagnosis not present

## 2015-03-20 DIAGNOSIS — N320 Bladder-neck obstruction: Secondary | ICD-10-CM | POA: Diagnosis not present

## 2015-03-20 DIAGNOSIS — Z79899 Other long term (current) drug therapy: Secondary | ICD-10-CM | POA: Insufficient documentation

## 2015-03-20 DIAGNOSIS — Z7982 Long term (current) use of aspirin: Secondary | ICD-10-CM | POA: Diagnosis not present

## 2015-03-20 DIAGNOSIS — N529 Male erectile dysfunction, unspecified: Secondary | ICD-10-CM | POA: Insufficient documentation

## 2015-03-20 HISTORY — DX: Bladder-neck obstruction: N32.0

## 2015-03-20 HISTORY — DX: Other enthesopathies, not elsewhere classified: M77.8

## 2015-03-20 HISTORY — PX: TRANSURETHRAL RESECTION OF BLADDER NECK: SHX6196

## 2015-03-20 LAB — POCT I-STAT 4, (NA,K, GLUC, HGB,HCT)
Glucose, Bld: 113 mg/dL — ABNORMAL HIGH (ref 65–99)
HCT: 43 % (ref 39.0–52.0)
HEMOGLOBIN: 14.6 g/dL (ref 13.0–17.0)
Potassium: 4.5 mmol/L (ref 3.5–5.1)
Sodium: 142 mmol/L (ref 135–145)

## 2015-03-20 SURGERY — RESECTION, BLADDER NECK, TRANSURETHRAL
Anesthesia: General | Site: Bladder

## 2015-03-20 MED ORDER — ACETAMINOPHEN 10 MG/ML IV SOLN
INTRAVENOUS | Status: DC | PRN
  Administered 2015-03-20: 1000 mg via INTRAVENOUS

## 2015-03-20 MED ORDER — ONDANSETRON HCL 4 MG/2ML IJ SOLN
INTRAMUSCULAR | Status: DC | PRN
  Administered 2015-03-20: 4 mg via INTRAVENOUS

## 2015-03-20 MED ORDER — BELLADONNA ALKALOIDS-OPIUM 16.2-60 MG RE SUPP
RECTAL | Status: DC | PRN
  Administered 2015-03-20: 1 via RECTAL

## 2015-03-20 MED ORDER — CEFAZOLIN SODIUM-DEXTROSE 2-3 GM-% IV SOLR
INTRAVENOUS | Status: AC
  Filled 2015-03-20: qty 50

## 2015-03-20 MED ORDER — DEXAMETHASONE SODIUM PHOSPHATE 4 MG/ML IJ SOLN
INTRAMUSCULAR | Status: DC | PRN
  Administered 2015-03-20: 10 mg via INTRAVENOUS

## 2015-03-20 MED ORDER — LACTATED RINGERS IV SOLN
INTRAVENOUS | Status: DC
  Administered 2015-03-20 (×2): via INTRAVENOUS
  Filled 2015-03-20: qty 1000

## 2015-03-20 MED ORDER — CIPROFLOXACIN HCL 500 MG PO TABS: 500.0000 mg | ORAL_TABLET | Freq: Two times a day (BID) | ORAL | Status: AC

## 2015-03-20 MED ORDER — LIDOCAINE HCL (CARDIAC) 20 MG/ML IV SOLN
INTRAVENOUS | Status: DC | PRN
  Administered 2015-03-20: 50 mg via INTRAVENOUS

## 2015-03-20 MED ORDER — BELLADONNA ALKALOIDS-OPIUM 16.2-60 MG RE SUPP
RECTAL | Status: AC
  Filled 2015-03-20: qty 1

## 2015-03-20 MED ORDER — PROPOFOL 10 MG/ML IV BOLUS
INTRAVENOUS | Status: DC | PRN
  Administered 2015-03-20: 150 mg via INTRAVENOUS

## 2015-03-20 MED ORDER — MIDAZOLAM HCL 2 MG/2ML IJ SOLN
INTRAMUSCULAR | Status: AC
  Filled 2015-03-20: qty 2

## 2015-03-20 MED ORDER — MIDAZOLAM HCL 5 MG/5ML IJ SOLN
INTRAMUSCULAR | Status: DC | PRN
  Administered 2015-03-20: 2 mg via INTRAVENOUS

## 2015-03-20 MED ORDER — FENTANYL CITRATE (PF) 100 MCG/2ML IJ SOLN
25.0000 ug | INTRAMUSCULAR | Status: DC | PRN
  Filled 2015-03-20: qty 1

## 2015-03-20 MED ORDER — KETOROLAC TROMETHAMINE 30 MG/ML IJ SOLN
INTRAMUSCULAR | Status: DC | PRN
  Administered 2015-03-20: 15 mg via INTRAVENOUS

## 2015-03-20 MED ORDER — KETOCONAZOLE 2 % EX CREA
TOPICAL_CREAM | Freq: Once | CUTANEOUS | Status: DC
  Filled 2015-03-20: qty 15

## 2015-03-20 MED ORDER — FENTANYL CITRATE (PF) 100 MCG/2ML IJ SOLN
INTRAMUSCULAR | Status: AC
  Filled 2015-03-20: qty 4

## 2015-03-20 MED ORDER — CEFAZOLIN SODIUM-DEXTROSE 2-3 GM-% IV SOLR
2.0000 g | INTRAVENOUS | Status: AC
  Administered 2015-03-20: 2 g via INTRAVENOUS
  Filled 2015-03-20: qty 50

## 2015-03-20 MED ORDER — OXYCODONE-ACETAMINOPHEN 5-325 MG PO TABS: 2.0000 | ORAL_TABLET | ORAL | Status: DC | PRN

## 2015-03-20 MED ORDER — PHENAZOPYRIDINE HCL 200 MG PO TABS: 200.0000 mg | ORAL_TABLET | Freq: Three times a day (TID) | ORAL | Status: DC | PRN

## 2015-03-20 MED ORDER — SODIUM CHLORIDE 0.9 % IR SOLN
Status: DC | PRN
  Administered 2015-03-20: 6000 mL via INTRAVESICAL

## 2015-03-20 MED ORDER — PROMETHAZINE HCL 25 MG/ML IJ SOLN
6.2500 mg | INTRAMUSCULAR | Status: DC | PRN
  Filled 2015-03-20: qty 1

## 2015-03-20 MED ORDER — FENTANYL CITRATE (PF) 100 MCG/2ML IJ SOLN
INTRAMUSCULAR | Status: DC | PRN
  Administered 2015-03-20: 50 ug via INTRAVENOUS
  Administered 2015-03-20 (×2): 25 ug via INTRAVENOUS

## 2015-03-20 SURGICAL SUPPLY — 30 items
ADAPTER CATH URET PLST 4-6FR (CATHETERS) IMPLANT
BAG DRAIN URO-CYSTO SKYTR STRL (DRAIN) ×3 IMPLANT
BALLN NEPHROSTOMY (BALLOONS)
BALLOON NEPHROSTOMY (BALLOONS) IMPLANT
BOOTIES KNEE HIGH SLOAN (MISCELLANEOUS) ×3 IMPLANT
CANISTER SUCT LVC 12 LTR MEDI- (MISCELLANEOUS) IMPLANT
CATH COUDE FOLEY 2W 5CC 20FR (CATHETERS) ×3 IMPLANT
CATH FOLEY 2W COUNCIL 20FR 5CC (CATHETERS) IMPLANT
CATH FOLEY 2WAY SLVR 30CC 20FR (CATHETERS) IMPLANT
CATH INTERMIT  6FR 70CM (CATHETERS) IMPLANT
CATH URET 5FR 28IN CONE TIP (BALLOONS)
CATH URET 5FR 28IN OPEN ENDED (CATHETERS) IMPLANT
CATH URET 5FR 70CM CONE TIP (BALLOONS) IMPLANT
CLOTH BEACON ORANGE TIMEOUT ST (SAFETY) ×3 IMPLANT
ELECT RESECT VAPORIZE 12D CBL (ELECTRODE) ×3 IMPLANT
GLOVE BIO SURGEON STRL SZ7.5 (GLOVE) ×6 IMPLANT
GLOVE BIOGEL PI IND STRL 6.5 (GLOVE) ×1 IMPLANT
GLOVE BIOGEL PI INDICATOR 6.5 (GLOVE) ×2
GOWN STRL REUS W/ TWL XL LVL3 (GOWN DISPOSABLE) IMPLANT
GOWN STRL REUS W/TWL LRG LVL3 (GOWN DISPOSABLE) ×3 IMPLANT
GOWN STRL REUS W/TWL XL LVL3 (GOWN DISPOSABLE) ×6 IMPLANT
GUIDEWIRE 0.038 PTFE COATED (WIRE) IMPLANT
GUIDEWIRE ANG ZIPWIRE 038X150 (WIRE) IMPLANT
GUIDEWIRE STR DUAL SENSOR (WIRE) IMPLANT
MANIFOLD NEPTUNE II (INSTRUMENTS) IMPLANT
NS IRRIG 500ML POUR BTL (IV SOLUTION) IMPLANT
PACK CYSTO (CUSTOM PROCEDURE TRAY) ×3 IMPLANT
SYR 30ML LL (SYRINGE) ×3 IMPLANT
SYRINGE IRR TOOMEY STRL 70CC (SYRINGE) IMPLANT
WATER STERILE IRR 3000ML UROMA (IV SOLUTION) ×3 IMPLANT

## 2015-03-20 NOTE — Discharge Instructions (Signed)
Post Anesthesia Home Care Instructions  Activity: Get plenty of rest for the remainder of the day. A responsible adult should stay with you for 24 hours following the procedure.  For the next 24 hours, DO NOT: -Drive a car -Paediatric nurse -Drink alcoholic beverages -Take any medication unless instructed by your physician -Make any legal decisions or sign important papers.  Meals: Start with liquid foods such as gelatin or soup. Progress to regular foods as tolerated. Avoid greasy, spicy, heavy foods. If nausea and/or vomiting occur, drink only clear liquids until the nausea and/or vomiting subsides. Call your physician if vomiting continues.  Special Instructions/Symptoms: Your throat may feel dry or sore from the anesthesia or the breathing tube placed in your throat during surgery. If this causes discomfort, gargle with warm salt water. The discomfort should disappear within 24 hours.  If you had a scopolamine patch placed behind your ear for the management of post- operative nausea and/or vomiting:  1. The medication in the patch is effective for 72 hours, after which it should be removed.  Wrap patch in a tissue and discard in the trash. Wash hands thoroughly with soap and water. 2. You may remove the patch earlier than 72 hours if you experience unpleasant side effects which may include dry mouth, dizziness or visual disturbances. 3. Avoid touching the patch. Wash your hands with soap and water after contact with the patch.   Foley Catheter Care A Foley catheter is a soft, flexible tube that is placed into the bladder to drain urine. A Foley catheter may be inserted if:  You leak urine or are not able to control when you urinate (urinary incontinence).  You are not able to urinate when you need to (urinary retention).  You had prostate surgery or surgery on the genitals.  You have certain medical conditions, such as multiple sclerosis, dementia, or a spinal cord injury. If  you are going home with a Foley catheter in place, follow the instructions below. TAKING CARE OF THE CATHETER 1. Wash your hands with soap and water. 2. Using mild soap and warm water on a clean washcloth:  Clean the area on your body closest to the catheter insertion site using a circular motion, moving away from the catheter. Never wipe toward the catheter because this could sweep bacteria up into the urethra and cause infection.  Remove all traces of soap. Pat the area dry with a clean towel. For males, reposition the foreskin. 3. Attach the catheter to your leg so there is no tension on the catheter. Use adhesive tape or a leg strap. If you are using adhesive tape, remove any sticky residue left behind by the previous tape you used. 4. Keep the drainage bag below the level of the bladder, but keep it off the floor. 5. Check throughout the day to be sure the catheter is working and urine is draining freely. Make sure the tubing does not become kinked. 6. Do not pull on the catheter or try to remove it. Pulling could damage internal tissues. TAKING CARE OF THE DRAINAGE BAGS You will be given two drainage bags to take home. One is a large overnight drainage bag, and the other is a smaller leg bag that fits underneath clothing. You may wear the overnight bag at any time, but you should never wear the smaller leg bag at night. Follow the instructions below for how to empty, change, and clean your drainage bags. Emptying the Drainage Bag You must empty your drainage  bag when it is  - full or at least 2-3 times a day. 1. Wash your hands with soap and water. 2. Keep the drainage bag below your hips, below the level of your bladder. This stops urine from going back into the tubing and into your bladder. 3. Hold the dirty bag over the toilet or a clean container. 4. Open the pour spout at the bottom of the bag and empty the urine into the toilet or container. Do not let the pour spout touch the toilet,  container, or any other surface. Doing so can place bacteria on the bag, which can cause an infection. 5. Clean the pour spout with a gauze pad or cotton ball that has rubbing alcohol on it. 6. Close the pour spout. 7. Attach the bag to your leg with adhesive tape or a leg strap. 8. Wash your hands well. Changing the Drainage Bag Change your drainage bag once a month or sooner if it starts to smell bad or look dirty. Below are steps to follow when changing the drainage bag. 1. Wash your hands with soap and water. 2. Pinch off the rubber catheter so that urine does not spill out. 3. Disconnect the catheter tube from the drainage tube at the connection valve. Do not let the tubes touch any surface. 4. Clean the end of the catheter tube with an alcohol wipe. Use a different alcohol wipe to clean the end of the drainage tube. 5. Connect the catheter tube to the drainage tube of the clean drainage bag. 6. Attach the new bag to the leg with adhesive tape or a leg strap. Avoid attaching the new bag too tightly. 7. Wash your hands well. Cleaning the Drainage Bag 1. Wash your hands with soap and water. 2. Wash the bag in warm, soapy water. 3. Rinse the bag thoroughly with warm water. 4. Fill the bag with a solution of white vinegar and water (1 cup vinegar to 1 qt warm water [.2 L vinegar to 1 L warm water]). Close the bag and soak it for 30 minutes in the solution. 5. Rinse the bag with warm water. 6. Hang the bag to dry with the pour spout open and hanging downward. 7. Store the clean bag (once it is dry) in a clean plastic bag. 8. Wash your hands well. PREVENTING INFECTION  Wash your hands before and after handling your catheter.  Take showers daily and wash the area where the catheter enters your body. Do not take baths. Replace wet leg straps with dry ones, if this applies.  Do not use powders, sprays, or lotions on the genital area. Only use creams, lotions, or ointments as directed by your  caregiver.  For females, wipe from front to back after each bowel movement.  Drink enough fluids to keep your urine clear or pale yellow unless you have a fluid restriction.  Do not let the drainage bag or tubing touch or lie on the floor.  Wear cotton underwear to absorb moisture and to keep your skin drier. SEEK MEDICAL CARE IF:   Your urine is cloudy or smells unusually bad.  Your catheter becomes clogged.  You are not draining urine into the bag or your bladder feels full.  Your catheter starts to leak. SEEK IMMEDIATE MEDICAL CARE IF:   You have pain, swelling, redness, or pus where the catheter enters the body.  You have pain in the abdomen, legs, lower back, or bladder.  You have a fever.  You see  blood fill the catheter, or your urine is pink or red.  You have nausea, vomiting, or chills.  Your catheter gets pulled out. MAKE SURE YOU:   Understand these instructions.  Will watch your condition.  Will get help right away if you are not doing well or get worse. Document Released: 09/05/2005 Document Revised: 01/20/2014 Document Reviewed: 08/27/2012 [facility] Patient Information 2015 [place], [place]. This information is not intended to replace advice given to you by your health care provider. Make sure you discuss any questions you have with your health care provider.

## 2015-03-20 NOTE — Anesthesia Procedure Notes (Signed)
Procedure Name: LMA Insertion Date/Time: 03/20/2015 11:19 AM Performed by: Mechele Claude Pre-anesthesia Checklist: Patient identified, Emergency Drugs available, Suction available and Patient being monitored Patient Re-evaluated:Patient Re-evaluated prior to inductionOxygen Delivery Method: Circle System Utilized Preoxygenation: Pre-oxygenation with 100% oxygen Intubation Type: IV induction Ventilation: Mask ventilation without difficulty LMA: LMA inserted LMA Size: 4.0 Number of attempts: 1 Airway Equipment and Method: Bite block Placement Confirmation: positive ETCO2 Tube secured with: Tape Dental Injury: Teeth and Oropharynx as per pre-operative assessment

## 2015-03-20 NOTE — Anesthesia Preprocedure Evaluation (Signed)
Anesthesia Evaluation  Patient identified by MRN, date of birth, ID band Patient awake    Reviewed: Allergy & Precautions, NPO status , Patient's Chart, lab work & pertinent test results  Airway Mallampati: II  TM Distance: >3 FB Neck ROM: Full    Dental no notable dental hx.    Pulmonary neg pulmonary ROS,  breath sounds clear to auscultation  Pulmonary exam normal       Cardiovascular Exercise Tolerance: Good hypertension, Pt. on medications Normal cardiovascular examRhythm:Regular Rate:Normal     Neuro/Psych negative neurological ROS  negative psych ROS   GI/Hepatic Neg liver ROS, GERD-  Medicated,  Endo/Other  negative endocrine ROS  Renal/GU negative Renal ROS  negative genitourinary   Musculoskeletal negative musculoskeletal ROS (+)   Abdominal   Peds negative pediatric ROS (+)  Hematology negative hematology ROS (+)   Anesthesia Other Findings   Reproductive/Obstetrics negative OB ROS                             Anesthesia Physical Anesthesia Plan  ASA: II  Anesthesia Plan: General   Post-op Pain Management:    Induction: Intravenous  Airway Management Planned: LMA  Additional Equipment:   Intra-op Plan:   Post-operative Plan: Extubation in OR  Informed Consent: I have reviewed the patients History and Physical, chart, labs and discussed the procedure including the risks, benefits and alternatives for the proposed anesthesia with the patient or authorized representative who has indicated his/her understanding and acceptance.   Dental advisory given  Plan Discussed with: CRNA  Anesthesia Plan Comments:         Anesthesia Quick Evaluation

## 2015-03-20 NOTE — Op Note (Signed)
Post-operative Diagnosis: Bladder Neck Contracture  Procedure and Anesthesia:  Procedure(s) and Anesthesia Type:    * RELEASE BLADDER NECK CONTRACTURE  WITH WOLF BUTTON ELECTRODE  - General 1. Transurethral Incision of Bladder Neck  Surgeon: Surgeon(s) and Role:    * Carolan Clines, MD - Primary   Resident:  Star Age  EBL: 5 mL  IVF: See anesthesia record  Drains: 24 Fr 2-way Coude catheter to straight drainage with 20 cc sterile water in the balloon port.  Implants: none  Specimens: none  Complications: * No complications entered in OR log *  Indications for Surgery: 69 y.o. male with history of prostate cancer s/p Cryotherapy in 2013 as well as newly diagnosed bladder neck contracture. See prior notes for full details. He presents today for transurethral incision of bladder neck. Risks, benefits, and alternatives of the above procedure were discussed previously in detail and informed consents was signed and verified.  Findings:  1. Significant TUR defect in the prostatic fossa with associated cryotherapy changes as well as some calcifications and a few false passages posteriorly 2. Bladder severely trabeculated. Trigone and bilateral ureteral orifices visualized and away from resection 2. Bladder neck opened at the 5 o'clock position with a newly formed trench down just proximal to the verumontanum with open bladder neck post incision 3. Difficulty passing straight foley catheter, if needs replacement will need to be with a Coude catheter  Procedure Details: The patient and consent was verified in the pre-op holding area and brought to the operating room where they were placed on the operating table. Pre-induction time out was called and general anesthesia was induced. SCDs were placed and IV antibiotics were started.   We began by inserting the 22.5 Fr rigid cystoscope per urethra with ample lubrication and normal saline running. Cystoscopic findings were as documented  above. We then inserted the 26 Fr resectoscope with Beatrix Fetters working element and button electrode and made a trough at the 5 o'clock position at the bladder neck connecting it down just proximal to the verumontanum. We took great care to obtain hemostasis along the way and not to undermine the bladder neck. Inspection from the verumontanum post resection demonstrated an open bladder neck which easily accommodated the 26 Fr scope. We again inspected and achieved excellent hemostasis. Small prostatic calcifications were irrigated and evacuated from the bladder. The bladder was left full and a 24 Fr Coude catheter was placed without difficulty after difficulty passing a 24 Fr straight catheter. Clear yellow urine returned. A B&O suppository was placed. Anesthesia was then reversed and the patient awoke and was taken to the PACU in stable condition having tolerated the procedure well.  Post-op plan: 1. Discharge home per PACU protocol 2. Patient will remove foley catheter at home on Sunday 03/22/15 and will call with any issues 3. Follow-up as scheduled.   Teaching Physician Attestation: Dr. Carolan Clines was present for the entire procedure  Pietro Cassis. Ottis Stain, MD Resident [facility] Department of Urologic Surgery/[facility]

## 2015-03-20 NOTE — Progress Notes (Signed)
RN taught patient and wife catheter care and how to discontinue the foley on 03/22/15. Patient and wife verbalized understanding of both. All questions answered. C.Marizol Borror,RN

## 2015-03-20 NOTE — Transfer of Care (Signed)
Last Vitals:  Filed Vitals:   03/20/15 0848  BP: 150/82  Pulse: 61  Temp: 37 C  Resp: 16    Immediate Anesthesia Transfer of Care Note  Patient: Phillip [name]  Procedure(s) Performed: Procedure(s) (LRB): RELEASE BLADDER NECK CONTRACTURE  WITH WOLF BUTTON ELECTRODE  (N/A)  Patient Location: PACU  Anesthesia Type: General  Level of Consciousness: awake, alert  and oriented  Airway & Oxygen Therapy: Patient Spontanous Breathing and Patient connected to face mask oxygen  Post-op Assessment: Report given to PACU RN and Post -op Vital signs reviewed and stable  Post vital signs: Reviewed and stable  Complications: No apparent anesthesia complications

## 2015-03-21 NOTE — Anesthesia Postprocedure Evaluation (Signed)
  Anesthesia Post-op Note  Patient: Phillip [name]  Procedure(s) Performed: Procedure(s) (LRB): RELEASE BLADDER NECK CONTRACTURE  WITH WOLF BUTTON ELECTRODE  (N/A)  Patient Location: PACU  Anesthesia Type: General  Level of Consciousness: awake and alert   Airway and Oxygen Therapy: Patient Spontanous Breathing  Post-op Pain: mild  Post-op Assessment: Post-op Vital signs reviewed, Patient's Cardiovascular Status Stable, Respiratory Function Stable, Patent Airway and No signs of Nausea or vomiting  Last Vitals:  Filed Vitals:   03/20/15 1329  BP: 130/80  Pulse:   Temp: 36.4 C  Resp: 16    Post-op Vital Signs: stable   Complications: No apparent anesthesia complications

## 2015-03-24 ENCOUNTER — Encounter (HOSPITAL_BASED_OUTPATIENT_CLINIC_OR_DEPARTMENT_OTHER): Payer: Self-pay | Admitting: Urology

## 2015-03-30 DIAGNOSIS — Z0000 Encounter for general adult medical examination without abnormal findings: Secondary | ICD-10-CM | POA: Diagnosis not present

## 2015-03-30 DIAGNOSIS — Z23 Encounter for immunization: Secondary | ICD-10-CM | POA: Diagnosis not present

## 2015-04-03 DIAGNOSIS — N320 Bladder-neck obstruction: Secondary | ICD-10-CM | POA: Diagnosis not present

## 2015-04-30 DIAGNOSIS — M722 Plantar fascial fibromatosis: Secondary | ICD-10-CM | POA: Diagnosis not present

## 2015-05-19 DIAGNOSIS — M722 Plantar fascial fibromatosis: Secondary | ICD-10-CM | POA: Diagnosis not present

## 2015-06-10 DIAGNOSIS — M722 Plantar fascial fibromatosis: Secondary | ICD-10-CM | POA: Diagnosis not present

## 2015-08-11 DIAGNOSIS — Z23 Encounter for immunization: Secondary | ICD-10-CM | POA: Diagnosis not present

## 2015-09-21 DIAGNOSIS — B9689 Other specified bacterial agents as the cause of diseases classified elsewhere: Secondary | ICD-10-CM | POA: Diagnosis not present

## 2015-09-21 DIAGNOSIS — J019 Acute sinusitis, unspecified: Secondary | ICD-10-CM | POA: Diagnosis not present

## 2015-10-19 DIAGNOSIS — C61 Malignant neoplasm of prostate: Secondary | ICD-10-CM | POA: Diagnosis not present

## 2015-10-19 DIAGNOSIS — N320 Bladder-neck obstruction: Secondary | ICD-10-CM | POA: Diagnosis not present

## 2015-10-21 HISTORY — PX: TRIGGER FINGER RELEASE: SHX641

## 2015-10-28 DIAGNOSIS — E291 Testicular hypofunction: Secondary | ICD-10-CM | POA: Diagnosis not present

## 2015-10-28 DIAGNOSIS — Z0000 Encounter for general adult medical examination without abnormal findings: Secondary | ICD-10-CM | POA: Diagnosis not present

## 2015-10-28 DIAGNOSIS — C61 Malignant neoplasm of prostate: Secondary | ICD-10-CM | POA: Diagnosis not present

## 2015-11-05 DIAGNOSIS — M65332 Trigger finger, left middle finger: Secondary | ICD-10-CM | POA: Diagnosis not present

## 2015-11-05 DIAGNOSIS — M65342 Trigger finger, left ring finger: Secondary | ICD-10-CM | POA: Diagnosis not present

## 2015-11-12 DIAGNOSIS — Z4789 Encounter for other orthopedic aftercare: Secondary | ICD-10-CM | POA: Diagnosis not present

## 2015-11-12 DIAGNOSIS — M65332 Trigger finger, left middle finger: Secondary | ICD-10-CM | POA: Diagnosis not present

## 2015-11-12 DIAGNOSIS — M65342 Trigger finger, left ring finger: Secondary | ICD-10-CM | POA: Diagnosis not present

## 2015-11-18 DIAGNOSIS — Z0000 Encounter for general adult medical examination without abnormal findings: Secondary | ICD-10-CM | POA: Diagnosis not present

## 2015-11-18 DIAGNOSIS — M65342 Trigger finger, left ring finger: Secondary | ICD-10-CM | POA: Diagnosis not present

## 2015-11-18 DIAGNOSIS — N320 Bladder-neck obstruction: Secondary | ICD-10-CM | POA: Diagnosis not present

## 2015-11-20 ENCOUNTER — Other Ambulatory Visit: Payer: Self-pay | Admitting: Urology

## 2015-12-07 ENCOUNTER — Encounter (HOSPITAL_BASED_OUTPATIENT_CLINIC_OR_DEPARTMENT_OTHER): Payer: Self-pay | Admitting: *Deleted

## 2015-12-07 NOTE — Progress Notes (Signed)
Pt instructed npo pmn 3/26 x norvasc, prilosec w sip of water.  To [facility] 3/27 @ 0715.  Needs istat on arrival.  ekg in epic.

## 2015-12-14 ENCOUNTER — Ambulatory Visit (HOSPITAL_BASED_OUTPATIENT_CLINIC_OR_DEPARTMENT_OTHER)
Admission: RE | Admit: 2015-12-14 | Discharge: 2015-12-14 | Disposition: A | Discharge status: Home / Self Care | Payer: PPO | Source: Ambulatory Visit | Attending: Urology | Admitting: Urology

## 2015-12-14 ENCOUNTER — Encounter (HOSPITAL_BASED_OUTPATIENT_CLINIC_OR_DEPARTMENT_OTHER): Payer: Self-pay | Admitting: *Deleted

## 2015-12-14 ENCOUNTER — Ambulatory Visit (HOSPITAL_BASED_OUTPATIENT_CLINIC_OR_DEPARTMENT_OTHER): Payer: PPO | Admitting: Anesthesiology

## 2015-12-14 ENCOUNTER — Encounter (HOSPITAL_BASED_OUTPATIENT_CLINIC_OR_DEPARTMENT_OTHER)
Admission: RE | Disposition: A | Discharge status: Home / Self Care | Payer: Self-pay | Source: Ambulatory Visit | Attending: Urology

## 2015-12-14 DIAGNOSIS — N281 Cyst of kidney, acquired: Secondary | ICD-10-CM | POA: Diagnosis not present

## 2015-12-14 DIAGNOSIS — Z87442 Personal history of urinary calculi: Secondary | ICD-10-CM | POA: Insufficient documentation

## 2015-12-14 DIAGNOSIS — Z8546 Personal history of malignant neoplasm of prostate: Secondary | ICD-10-CM | POA: Insufficient documentation

## 2015-12-14 DIAGNOSIS — I10 Essential (primary) hypertension: Secondary | ICD-10-CM | POA: Insufficient documentation

## 2015-12-14 DIAGNOSIS — Z79899 Other long term (current) drug therapy: Secondary | ICD-10-CM | POA: Insufficient documentation

## 2015-12-14 DIAGNOSIS — M1990 Unspecified osteoarthritis, unspecified site: Secondary | ICD-10-CM | POA: Insufficient documentation

## 2015-12-14 DIAGNOSIS — N3289 Other specified disorders of bladder: Secondary | ICD-10-CM | POA: Diagnosis not present

## 2015-12-14 DIAGNOSIS — Z841 Family history of disorders of kidney and ureter: Secondary | ICD-10-CM | POA: Diagnosis not present

## 2015-12-14 DIAGNOSIS — E291 Testicular hypofunction: Secondary | ICD-10-CM | POA: Diagnosis not present

## 2015-12-14 DIAGNOSIS — N320 Bladder-neck obstruction: Secondary | ICD-10-CM | POA: Insufficient documentation

## 2015-12-14 DIAGNOSIS — K219 Gastro-esophageal reflux disease without esophagitis: Secondary | ICD-10-CM | POA: Insufficient documentation

## 2015-12-14 DIAGNOSIS — Z7982 Long term (current) use of aspirin: Secondary | ICD-10-CM | POA: Diagnosis not present

## 2015-12-14 DIAGNOSIS — C61 Malignant neoplasm of prostate: Secondary | ICD-10-CM | POA: Diagnosis not present

## 2015-12-14 HISTORY — DX: Unspecified urinary incontinence: R32

## 2015-12-14 HISTORY — DX: Presence of spectacles and contact lenses: Z97.3

## 2015-12-14 HISTORY — PX: GREEN LIGHT LASER TURP (TRANSURETHRAL RESECTION OF PROSTATE: SHX6260

## 2015-12-14 HISTORY — DX: Reserved for inherently not codable concepts without codable children: IMO0001

## 2015-12-14 HISTORY — DX: Unspecified osteoarthritis, unspecified site: M19.90

## 2015-12-14 HISTORY — DX: Unspecified cataract: H26.9

## 2015-12-14 LAB — POCT I-STAT, CHEM 8
BUN: 15 mg/dL (ref 6–20)
CALCIUM ION: 1.18 mmol/L (ref 1.13–1.30)
CHLORIDE: 110 mmol/L (ref 101–111)
Creatinine, Ser: 1.2 mg/dL (ref 0.61–1.24)
GLUCOSE: 117 mg/dL — AB (ref 65–99)
HCT: 52 % (ref 39.0–52.0)
Hemoglobin: 17.7 g/dL — ABNORMAL HIGH (ref 13.0–17.0)
POTASSIUM: 4 mmol/L (ref 3.5–5.1)
Sodium: 142 mmol/L (ref 135–145)
TCO2: 23 mmol/L (ref 0–100)

## 2015-12-14 SURGERY — GREEN LIGHT LASER TURP (TRANSURETHRAL RESECTION OF PROSTATE
Anesthesia: General

## 2015-12-14 MED ORDER — LIDOCAINE HCL 2 % EX GEL
CUTANEOUS | Status: DC | PRN
  Administered 2015-12-14: 1 via URETHRAL

## 2015-12-14 MED ORDER — OXYCODONE HCL 5 MG/5ML PO SOLN
5.0000 mg | Freq: Once | ORAL | Status: DC | PRN
  Filled 2015-12-14: qty 5

## 2015-12-14 MED ORDER — PROPOFOL 10 MG/ML IV BOLUS
INTRAVENOUS | Status: AC
  Filled 2015-12-14: qty 20

## 2015-12-14 MED ORDER — LACTATED RINGERS IV SOLN
INTRAVENOUS | Status: DC
  Administered 2015-12-14: 08:00:00 via INTRAVENOUS
  Filled 2015-12-14: qty 1000

## 2015-12-14 MED ORDER — ACETAMINOPHEN 325 MG PO TABS
325.0000 mg | ORAL_TABLET | ORAL | Status: DC | PRN
  Filled 2015-12-14: qty 2

## 2015-12-14 MED ORDER — BELLADONNA ALKALOIDS-OPIUM 16.2-60 MG RE SUPP
RECTAL | Status: DC | PRN
  Administered 2015-12-14: 1 via RECTAL

## 2015-12-14 MED ORDER — CEFAZOLIN SODIUM-DEXTROSE 2-3 GM-% IV SOLR
INTRAVENOUS | Status: AC
  Filled 2015-12-14: qty 50

## 2015-12-14 MED ORDER — KETOROLAC TROMETHAMINE 30 MG/ML IJ SOLN
30.0000 mg | Freq: Once | INTRAMUSCULAR | Status: AC
  Administered 2015-12-14: 30 mg via INTRAVENOUS
  Filled 2015-12-14: qty 1

## 2015-12-14 MED ORDER — OXYCODONE HCL 5 MG PO TABS
5.0000 mg | ORAL_TABLET | Freq: Once | ORAL | Status: DC | PRN
  Filled 2015-12-14: qty 1

## 2015-12-14 MED ORDER — SODIUM CHLORIDE 0.9 % IR SOLN
Status: DC | PRN
  Administered 2015-12-14 (×4): 1000 mL via INTRAVESICAL
  Administered 2015-12-14: 3000 mL via INTRAVESICAL

## 2015-12-14 MED ORDER — FENTANYL CITRATE (PF) 100 MCG/2ML IJ SOLN
25.0000 ug | INTRAMUSCULAR | Status: DC | PRN
  Filled 2015-12-14: qty 1

## 2015-12-14 MED ORDER — DEXTROSE 5 % IV SOLN
2.0000 g | INTRAVENOUS | Status: AC
  Administered 2015-12-14: 2 g via INTRAVENOUS
  Filled 2015-12-14: qty 20

## 2015-12-14 MED ORDER — MIDAZOLAM HCL 5 MG/5ML IJ SOLN
INTRAMUSCULAR | Status: DC | PRN
  Administered 2015-12-14: 2 mg via INTRAVENOUS

## 2015-12-14 MED ORDER — LIDOCAINE HCL 2 % EX GEL
CUTANEOUS | Status: AC
  Filled 2015-12-14: qty 5

## 2015-12-14 MED ORDER — LIDOCAINE HCL (CARDIAC) 20 MG/ML IV SOLN
INTRAVENOUS | Status: AC
  Filled 2015-12-14: qty 5

## 2015-12-14 MED ORDER — ACETAMINOPHEN 500 MG PO TABS
ORAL_TABLET | ORAL | Status: AC
  Filled 2015-12-14: qty 2

## 2015-12-14 MED ORDER — ONDANSETRON HCL 4 MG/2ML IJ SOLN
INTRAMUSCULAR | Status: AC
  Filled 2015-12-14: qty 2

## 2015-12-14 MED ORDER — ACETAMINOPHEN 500 MG PO TABS
1000.0000 mg | ORAL_TABLET | Freq: Four times a day (QID) | ORAL | Status: DC | PRN
  Administered 2015-12-14: 1000 mg via ORAL
  Filled 2015-12-14: qty 2

## 2015-12-14 MED ORDER — FENTANYL CITRATE (PF) 100 MCG/2ML IJ SOLN
INTRAMUSCULAR | Status: DC | PRN
  Administered 2015-12-14: 100 ug via INTRAVENOUS

## 2015-12-14 MED ORDER — FENTANYL CITRATE (PF) 100 MCG/2ML IJ SOLN
INTRAMUSCULAR | Status: AC
  Filled 2015-12-14: qty 2

## 2015-12-14 MED ORDER — BELLADONNA ALKALOIDS-OPIUM 16.2-60 MG RE SUPP
RECTAL | Status: AC
  Filled 2015-12-14: qty 1

## 2015-12-14 MED ORDER — TRAMADOL-ACETAMINOPHEN 37.5-325 MG PO TABS
1.0000 | ORAL_TABLET | Freq: Four times a day (QID) | ORAL | Status: DC | PRN
Start: 2015-12-14 — End: 2018-11-25

## 2015-12-14 MED ORDER — STERILE WATER FOR IRRIGATION IR SOLN
Status: DC | PRN
  Administered 2015-12-14: 500 mL

## 2015-12-14 MED ORDER — ACETAMINOPHEN 160 MG/5ML PO SOLN
325.0000 mg | ORAL | Status: DC | PRN
  Filled 2015-12-14: qty 20.3

## 2015-12-14 MED ORDER — ONDANSETRON HCL 4 MG/2ML IJ SOLN
INTRAMUSCULAR | Status: DC | PRN
  Administered 2015-12-14: 4 mg via INTRAVENOUS

## 2015-12-14 MED ORDER — LIDOCAINE HCL (CARDIAC) 20 MG/ML IV SOLN
INTRAVENOUS | Status: DC | PRN
  Administered 2015-12-14: 80 mg via INTRAVENOUS

## 2015-12-14 MED ORDER — PROPOFOL 10 MG/ML IV BOLUS
INTRAVENOUS | Status: DC | PRN
  Administered 2015-12-14: 160 mg via INTRAVENOUS

## 2015-12-14 MED ORDER — MIDAZOLAM HCL 2 MG/2ML IJ SOLN
INTRAMUSCULAR | Status: AC
  Filled 2015-12-14: qty 2

## 2015-12-14 MED ORDER — DEXAMETHASONE SODIUM PHOSPHATE 10 MG/ML IJ SOLN
INTRAMUSCULAR | Status: AC
Start: 2015-12-14 — End: 2015-12-14
  Filled 2015-12-14: qty 1

## 2015-12-14 MED ORDER — DEXAMETHASONE SODIUM PHOSPHATE 4 MG/ML IJ SOLN
INTRAMUSCULAR | Status: DC | PRN
  Administered 2015-12-14: 10 mg via INTRAVENOUS

## 2015-12-14 MED ORDER — PHENAZOPYRIDINE HCL 200 MG PO TABS: 200.0000 mg | ORAL_TABLET | Freq: Three times a day (TID) | ORAL | Status: DC | PRN

## 2015-12-14 SURGICAL SUPPLY — 32 items
BAG DRAIN URO-CYSTO SKYTR STRL (DRAIN) ×3 IMPLANT
BAG URINE DRAINAGE (UROLOGICAL SUPPLIES) ×3 IMPLANT
CATH COUDE FOLEY 2W 5CC 18FR (CATHETERS) IMPLANT
CATH FOLEY 2WAY SLVR  5CC 20FR (CATHETERS) ×2
CATH FOLEY 2WAY SLVR 30CC 22FR (CATHETERS) IMPLANT
CATH FOLEY 2WAY SLVR 5CC 20FR (CATHETERS) ×1 IMPLANT
CATH HEMA 3WAY 30CC 24FR COUDE (CATHETERS) IMPLANT
CLOTH BEACON ORANGE TIMEOUT ST (SAFETY) ×3 IMPLANT
ELECT BIVAP BIPO 22/24 DONUT (ELECTROSURGICAL)
ELECT LOOP MED HF 24F 12D (CUTTING LOOP) IMPLANT
ELECTRD BIVAP BIPO 22/24 DONUT (ELECTROSURGICAL) IMPLANT
GLOVE BIOGEL M STRL SZ7.5 (GLOVE) ×3 IMPLANT
GOWN STRL REUS W/ TWL XL LVL3 (GOWN DISPOSABLE) ×1 IMPLANT
GOWN STRL REUS W/TWL XL LVL3 (GOWN DISPOSABLE) ×2
HOLDER FOLEY CATH W/STRAP (MISCELLANEOUS) ×3 IMPLANT
IV NS 1000ML (IV SOLUTION) ×8
IV NS 1000ML BAXH (IV SOLUTION) ×4 IMPLANT
IV NS IRRIG 3000ML ARTHROMATIC (IV SOLUTION) ×3 IMPLANT
IV SET EXTENSION GRAVITY 40 LF (IV SETS) ×3 IMPLANT
KIT ROOM TURNOVER WOR (KITS) ×3 IMPLANT
LASER FIBER /GREENLIGHT LASER (Laser) ×3 IMPLANT
LASER GREENLIGHT RENTAL P/PROC (Laser) ×3 IMPLANT
LOOP CUT BIPOLAR 24F LRG (ELECTROSURGICAL) IMPLANT
MANIFOLD NEPTUNE II (INSTRUMENTS) ×3 IMPLANT
PACK CYSTO (CUSTOM PROCEDURE TRAY) ×3 IMPLANT
PLUG CATH AND CAP STER (CATHETERS) ×3 IMPLANT
SET ASPIRATION TUBING (TUBING) ×3 IMPLANT
SYR 30ML LL (SYRINGE) ×3 IMPLANT
SYRINGE IRR TOOMEY STRL 70CC (SYRINGE) IMPLANT
TUBE CONNECTING 12'X1/4 (SUCTIONS) ×1
TUBE CONNECTING 12X1/4 (SUCTIONS) ×2 IMPLANT
WATER STERILE IRR 500ML POUR (IV SOLUTION) ×3 IMPLANT

## 2015-12-14 NOTE — Anesthesia Preprocedure Evaluation (Signed)
Anesthesia Evaluation  Patient identified by MRN, date of birth, ID band Patient awake    Reviewed: Allergy & Precautions, NPO status , Patient's Chart, lab work & pertinent test results  History of Anesthesia Complications Negative for: history of anesthetic complications  Airway Mallampati: III  TM Distance: <3 FB Neck ROM: Full    Dental  (+) Teeth Intact   Pulmonary neg pulmonary ROS,    breath sounds clear to auscultation       Cardiovascular hypertension, Pt. on medications  Rhythm:Regular     Neuro/Psych negative neurological ROS  negative psych ROS   GI/Hepatic Neg liver ROS, GERD  Medicated and Controlled,  Endo/Other  negative endocrine ROS  Renal/GU Renal InsufficiencyRenal disease     Musculoskeletal   Abdominal   Peds  Hematology   Anesthesia Other Findings   Reproductive/Obstetrics                             Anesthesia Physical Anesthesia Plan  ASA: II  Anesthesia Plan: General   Post-op Pain Management:    Induction: Intravenous  Airway Management Planned: LMA  Additional Equipment: None  Intra-op Plan:   Post-operative Plan: Extubation in OR  Informed Consent: I have reviewed the patients History and Physical, chart, labs and discussed the procedure including the risks, benefits and alternatives for the proposed anesthesia with the patient or authorized representative who has indicated his/her understanding and acceptance.   Dental advisory given  Plan Discussed with: CRNA and Surgeon  Anesthesia Plan Comments:         Anesthesia Quick Evaluation

## 2015-12-14 NOTE — Op Note (Signed)
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  Postoperative diagnosis:  same  Operation:  Greenlight Laser Bladder neck incision  Surgeon:  S. Gaynelle Arabian, MD  First assistant:  None  Anesthesia:  General LMA  Preparation:  After appropriate premedication, the patient brought the operating, placed on the operating table in the dorsal supine position where general LMA anesthesia was introduced. He was replaced in the dorsal lithotomy position with pubis was prepped with Betadine solution and draped in usual fashion. The history was double checked. The arm and was double checked.  Review history:  Active Problems  Problems  1. Bilateral kidney stones (N20.0)   Assessed By: Carolan Clines (Urology); Last Assessed: 19 Nov 2014  2. 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 Hypogonadism, testicular (E29.1)   Assessed By: Carolan Clines (Urology); Last Assessed: 28 Oct 2015  5. Malignant neoplasm of prostate (C61)   Assessed By: Carolan Clines (Urology); Last Assessed: 28 Oct 2015   Last Impression: 05 Nov 2013  Cyroablation 2013 Dr. Robbie Lis at [place]  (Urology)  6. 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 Hx of prostate cancer. He is s/p TUI of bladder neck contracture on 03/20/15.  Hx of prostate cancer, treated with cryotherapy in September 2013 at [facility]. In addition, the patient has a history of ED, treated with Coloplast IPP in 2015. He has tried Cialis 20mg , but that doesn't help. He has hypogonadism, on testosterone injections. He has a known abnormal bladder (? Hypotonic), diagnosed at the  [facility] He voids by Cred, allowing more time to. His last evaluation was 3 years ago.  Video urodynamics on 12/17/14, is accomplished in the sitting position. His first sensation of fill occurs at 51 cc with normal desire at 99 cc. He has strong desire at 125 cc. His bladder becomes unstable, with the first contraction occurring at 97 cc. Maximum unstable bladder contraction pressure is 1570 was a water during this filling phase, and 42 cm of water during his voiding with urgency. The patient was able to inhibit these low amplitude unstable contractions without leaking. Leak point pressure determination is accomplished, and shows that the patient does not leak at the maximum abdominal pressure generation of 143 7 m of water.  Pressure flow study: The maximum flow rate is 5 cc/s, with detrusor pressure at maximum flow of 32 cm of water, and maximum detrusor pressure of 39 cm of water. The PVR is 20-30 cc. He does void with an obstructed flow pattern, however.      Statement of  Likelihood of Success: Excellent. TIME-OUT observed.:  Procedure: Cystourethroscopy was accomplished. The patient was noted to have an identifiable penile prosthesis in place, which was partially inflated. Cystoscopy revealed dense bladder neck contracture. The greenlight laser was placed. No bladder stone tumor or diverticular formation was identified. Clear reflux was seen from both ureteral orifices. Trabeculation was noted, but there was no evidence of cellule formation or bladder diverticular formation.  We may laser was then used to form a trench from the 7:00 through the 6 clock position, extending from the bladder neck to the Roux. Minimal bleeding was identified, and  this was coagulated using the laser. Clear urine was obtained. I elected to place a size 20 [place] Foley with 10 mL balloon. Lidocaine jelly was placed, and a B&O suppository was also placed. The patient received IV Toradol. The patient was awakened, and taken to  recovery room in good condition.

## 2015-12-14 NOTE — H&P (Signed)
Reason For Visit Cystoscopy   Active Problems Problems  1. Bilateral kidney stones (N20.0)   Assessed By: Carolan Clines (Urology); Last Assessed: 19 Nov 2014 2. 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 Bladder outlet obstruction (N32.0)   Assessed By: Carolan Clines (Urology); Last Assessed: 03 Apr 2015 4. Hypogonadism, testicular (E29.1)   Assessed By: Carolan Clines (Urology); Last Assessed: 28 Oct 2015 5. Malignant neoplasm of prostate (C61)   Assessed By: Carolan Clines (Urology); Last Assessed: 28 Oct 2015   Last Impression: 05 Nov 2013  Cyroablation 2013 Dr. Robbie Lis at [place]     (Urology) 6. 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 Hx of prostate cancer. He is s/p TUI of bladder neck contracture on 03/20/15.     Hx of prostate cancer, treated with cryotherapy in September 2013 at [facility]. In addition, the patient has a history of ED, treated with Coloplast IPP in 2015. He has tried Cialis 20mg , but that doesn't help. He has hypogonadism, on testosterone injections. He has a known abnormal bladder (? Hypotonic), diagnosed at the [facility]. He voids by Cred, allowing more time to. His last evaluation was 3 years ago.    Video urodynamics on 12/17/14, is accomplished in the sitting position. His first sensation of fill occurs at 51 cc with normal desire at 99 cc. He has strong desire at 125 cc. His bladder becomes unstable, with the first contraction occurring at 97 cc. Maximum unstable bladder contraction pressure is 1570 was a water during this filling phase, and 42 cm of water during his voiding with urgency. The patient was able to inhibit these low amplitude unstable contractions without leaking. Leak point pressure determination is accomplished, and shows that the patient does not  leak at the maximum abdominal pressure generation of 143 7 m of water.    Pressure flow study: The maximum flow rate is 5 cc/s, with detrusor pressure at maximum flow of 32 cm of water, and maximum detrusor pressure of 39 cm of water. The PVR is 20-30 cc. He does void with an obstructed flow pattern, however.    EMG activity is normal. VCUG shows no bony abnormality. There is no reflux. Small bladder diverticula are noted.   Past Medical History Problems  1. History of arthritis (Z87.39) 2. History of esophageal reflux (Z87.19)  Surgical History Problems  1. History of Arthrodesis Thumb Carpometacarpal Joint 2. History of Prostate Surgery 3. History of Surg Penis Insertion Of Penile Prosthesis 4. History of Transuret Resect Of Regrow Postop Bladder Neck Contracture  Current Meds 1. AmLODIPine Besylate 5 MG Oral Tablet;  Therapy: (Recorded:17Feb2015) to Recorded 2. Aspirin 81 MG TABS;  Therapy: (Recorded:17Feb2015) to Recorded 3. BD Luer-Lok Syringe 22G X 1-1/2" 3 ML Miscellaneous; USE AS DIRECTED;  Therapy: [id number] to (Evaluate:01Aug2016)  Requested for: [id number]; Last  Rx:03May2016 Ordered 4. Benazepril HCl - 20 MG Oral Tablet;  Therapy: (Recorded:17Feb2015) to Recorded 5. DiazePAM 10 MG Oral Tablet; Take tablet 1 hour prior to procedure;  Therapy: [id number] to (Last Rx:27Feb2017) Ordered 6. Glucosamine Chondroitin TABS;  Therapy: (Recorded:08Feb2017) to Recorded 7. Multivitamins TABS;  Therapy: (Recorded:17Feb2015) to Recorded 8. Niacin TABS;  Therapy: (Recorded:17Feb2015) to Recorded 9. Omeprazole 20 MG Oral Tablet Delayed Release;  Therapy: (Recorded:17Feb2015) to Recorded 10. Potassium TABS;   Therapy: (Recorded:08Feb2017) to Recorded 11. Syringe  22G X 1-1/2" 3 ML Miscellaneous; USE AS DIRECTED;   Therapy: 23Dec2015 to (Last Rx:23Dec2015)  Requested for: 23Dec2015 Ordered 12. Testosterone Cypionate 200 MG/ML Intramuscular Solution; INJECT 1 ML   INTRAMUSCULAR  EVERY 2 WEEKS;   Therapy: [id number] to (Evaluate:27Jun2017)  Requested for: [id number]; Last   Rx:10Jan2017 Ordered 13. Yale Disp Needles 18G X 1" Miscellaneous; use as directed to draw medication;   Therapy: [id number] to (Evaluate:01Aug2016)  Requested for: [id number]; Last   Rx:03May2016 Ordered  Allergies Medication  1. No Known Drug Allergies  Family History Problems  1. Family history of Death : Mother, Father 2. Family history of kidney stones (Z84.1) : Brother 3. Family history of myocardial infarction (Z82.49) : Mother  Social History Problems  1. Denied: History of Alcohol use 2. Married 3. Never smoker 4. No caffeine use 5. No tobacco/smoke exposure 6. Retired  Review of Systems Genitourinary, constitutional, skin, eye, otolaryngeal, hematologic/lymphatic, cardiovascular, pulmonary, endocrine, musculoskeletal, gastrointestinal, neurological and psychiatric system(s) were reviewed and pertinent findings if present are noted and are otherwise negative.    Vitals Vital Signs [Data Includes: Last 1 Day]  Recorded: [id number] 02:40PM  Blood Pressure: 138 / 76 Temperature: 98.5 F Heart Rate: 77  Physical Exam Constitutional: Well nourished and well developed . No acute distress.  ENT:. The ears and nose are normal in appearance.  Neck: The appearance of the neck is normal and no neck mass is present.  Pulmonary: No respiratory distress and normal respiratory rhythm and effort.  Cardiovascular: Heart rate and rhythm are normal . No peripheral edema.  Abdomen: The abdomen is rounded, but not distended. The abdomen is soft and nontender. No masses are palpated. No CVA tenderness. No hernias are palpable. No hepatosplenomegaly noted.  Rectal: Rectal exam demonstrates normal sphincter tone. The prostate is smooth and flat . Estimated prostate size is 1+. The prostate has no nodularity, is not indurated, is not tender and is not fluctuant. The left seminal vesicle is  nonpalpable. The right seminal vesicle is nonpalpable. The perineum is normal on inspection.  Genitourinary: Examination of the penis demonstrates no discharge, no masses, no lesions and a normal meatus. The scrotum is without lesions. The right epididymis is palpably normal and non-tender. The left epididymis is palpably normal and non-tender. The right testis is non-tender and without masses. The left testis is non-tender and without masses.  Lymphatics: The femoral and inguinal nodes are not enlarged or tender.  Skin: Normal skin turgor, no visible rash and no visible skin lesions.  Neuro/Psych:. Mood and affect are appropriate.    Results/Data Urine [Data Includes: Last 1 Day]   [id number]  COLOR YELLOW   APPEARANCE CLEAR   SPECIFIC GRAVITY 1.025   pH 5.5   GLUCOSE NEGATIVE   BILIRUBIN NEGATIVE   KETONE TRACE   BLOOD NEGATIVE   PROTEIN NEGATIVE   NITRITE NEGATIVE   LEUKOCYTE ESTERASE NEGATIVE   Selected Results  UA With REFLEX [id number] 02:16PM Carolan Clines  SPECIMEN TYPE: CLEAN CATCH   Test Name Result Flag Reference  COLOR YELLOW  YELLOW  ** PLEASE NOTE CHANGE IN UNIT OF MEASURE AND REFERENCE RANGE(S). **  APPEARANCE CLEAR  CLEAR  SPECIFIC GRAVITY 1.025  1.001-1.035  pH 5.5  5.0-8.0  GLUCOSE NEGATIVE  NEGATIVE  BILIRUBIN NEGATIVE  NEGATIVE  KETONE TRACE A NEGATIVE  BLOOD NEGATIVE  NEGATIVE  PROTEIN NEGATIVE  NEGATIVE  NITRITE NEGATIVE  NEGATIVE  LEUKOCYTE ESTERASE NEGATIVE  NEGATIVE   Procedure  Procedure: Cystoscopy  Chaperone Present: kim lewis.  Indication: Lower Urinary Tract Symptoms.  Informed Consent: Risks, benefits, and potential adverse events were discussed and informed consent was obtained from the patient.  Premedication: Diazepam 10 mg.  Prep: The patient was prepped with betadine.  Anesthesia:. Local anesthesia was administered intraurethrally with 2% lidocaine jelly.  Antibiotic prophylaxis: Ciprofloxacin.  Procedure Note:  Urethral  meatus:. No abnormalities.  Anterior urethra: No abnormalities.  Prostatic urethra:. There was visual obstruction of the prostatic urethra. A bladder neck contracture was present.  Bladder: Visulization was clear. The ureteral orifices were in the normal anatomic position bilaterally and had clear efflux of urine. A systematic survey of the bladder demonstrated no bladder tumors or stones. Examination of the bladder demonstrated no trabeculation and no diverticulum. The patient tolerated the procedure well.  Complications: None.    Assessment Assessed  1. 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 The patient will need repeat incision of his bladder neck. I'll use greenlight laser. In the past he has had button electrode vaporization. We will leave Foley catheter postop for several days.   Plan Schedule surgery for release of BNC using Greenlight.   Discussion/Summary cc: Dr. Emily Filbert     Signatures Electronically signed by : Carolan Clines, M.D.; Nov 19 2015 10:14AM EST

## 2015-12-14 NOTE — Transfer of Care (Signed)
Immediate Anesthesia Transfer of Care Note  Patient: Phillip [name]  Procedure(s) Performed: Procedure(s): GREEN LIGHT LASER TURP (TRANSURETHRAL RESECTION OF PROSTATE) LASER OF BLADDER NECK CONTRACTURE (N/A)  Patient Location: PACU  Anesthesia Type:General  Level of Consciousness: awake, sedated and responds to stimulation  Airway & Oxygen Therapy: Patient Spontanous Breathing and Patient connected to face mask oxygen  Post-op Assessment: Report given to RN and Post -op Vital signs reviewed and stable  Post vital signs: Reviewed and stable  Last Vitals:  Filed Vitals:   12/14/15 0734  BP: 146/78  Pulse: 57  Temp: 37 C  Resp: 16    Complications: No apparent anesthesia complications

## 2015-12-14 NOTE — Anesthesia Postprocedure Evaluation (Signed)
Anesthesia Post Note  Patient: Phillip [name]  Procedure(s) Performed: Procedure(s) (LRB): GREEN LIGHT LASER TURP (TRANSURETHRAL RESECTION OF PROSTATE) LASER OF BLADDER NECK CONTRACTURE (N/A)  Patient location during evaluation: PACU Anesthesia Type: General Level of consciousness: awake Pain management: pain level controlled Vital Signs Assessment: post-procedure vital signs reviewed and stable Respiratory status: spontaneous breathing Cardiovascular status: stable Postop Assessment: no signs of nausea or vomiting Anesthetic complications: no    Last Vitals:  Filed Vitals:   12/14/15 1045 12/14/15 1114  BP: 146/85 138/84  Pulse: 50 50  Temp:  36.4 C  Resp: 12 16    Last Pain: There were no vitals filed for this visit.               Jontez Redfield

## 2015-12-14 NOTE — Interval H&P Note (Signed)
History and Physical Interval Note:  12/14/2015 8:34 AM  Phillip [name]  has presented today for surgery, with the diagnosis of BLADDER NECK CONTRACTURE  The various methods of treatment have been discussed with the patient and family. After consideration of risks, benefits and other options for treatment, the patient has consented to  Procedure(s): GREEN LIGHT LASER TURP (TRANSURETHRAL RESECTION OF PROSTATE) LASER OF BLADDER NECK CONTRACTURE (N/A) as a surgical intervention .  The patient's history has been reviewed, patient examined, no change in status, stable for surgery.  I have reviewed the patient's chart and labs.  Questions were answered to the patient's satisfaction.     Sadie Pickar I Olga Bourbeau

## 2015-12-14 NOTE — Discharge Instructions (Addendum)
Transurethral Procedure  Medications: Resume all your other meds from home  Activity: 1. No heavy lifting > 10 pounds for 2 weeks. 2. No sexual activity for 2 weeks. 3. No strenuous activity for 2 weeks. 4. No driving while on narcotic pain medications. 5. Drink plenty of water. 6. Continue to walk at home - you can still get blood clots when you are at home so keep     active but don't over do it. 7. Your urine may have some blood in it - make sure you drink plenty of water. Call or           come to the ER immediately if you catheter stops draining or you are unable to urinate.  Bathing: You can shower. You can take a bath unless you have a foley catheter in place.  Signs / Symptoms to call: 1. Call if you have a fever greater than 101.5  2. Uncontrolled nausea / vomiting, uncontrolled pain / dizziness, unable to urinate, leg         swelling / leg pain, or any other concerns.   You can reach [place] at [phone]  Foley Catheter Care, Adult A Foley catheter is a soft, flexible tube. This tube is placed into your bladder to drain pee (urine). If you go home with this catheter in place, follow the instructions below. TAKING CARE OF THE CATHETER 1. Wash your hands with soap and water. 2. Put soap and water on a clean washcloth.  Clean the skin where the tube goes into your body.  Clean away from the tube site.  Never wipe toward the tube.  Clean the area using a circular motion.  Remove all the soap. Pat the area dry with a clean towel. For males, reposition the skin that covers the end of the penis (foreskin). 3. Attach the tube to your leg with tape or a leg strap. Do not stretch the tube tight. If you are using tape, remove any stickiness left behind by past tape you used. 4. Keep the drainage bag below your hips. Keep it off the floor. 5. Check your tube during the day. Make sure it is working and draining. Make sure the tube does  not curl, twist, or bend. 6. Do not pull on the tube or try to take it out. 7. You will need to come back to the office on Wednesday for catheter removal.  TAKING CARE OF THE DRAINAGE BAGS You will have a large overnight drainage bag and a small leg bag. You may wear the overnight bag any time. Never wear the small bag at night. Follow the directions below. Emptying the Drainage Bag Empty your drainage bag when it is  - full or at least 2-3 times a day. 1. Wash your hands with soap and water. 2. Keep the drainage bag below your hips. 3. Hold the dirty bag over the toilet or clean container. 4. Open the pour spout at the bottom of the bag. Empty the pee into the toilet or container. Do not let the pour spout touch anything. 5. Clean the pour spout with a gauze pad or cotton ball that has rubbing alcohol on it. 6. Close the pour spout. 7. Attach the bag to your leg with tape or a leg strap. 8. Wash your hands well. Changing the Drainage Bag Change your bag once a month or sooner if it starts to smell or look dirty.  1. Wash your hands with soap and water. 2. Pinch  the rubber tube so that pee does not spill out. 3. Disconnect the catheter tube from the drainage tube at the connection valve. Do not let the tubes touch anything. 4. Clean the end of the catheter tube with an alcohol wipe. Clean the end of a the drainage tube with a different alcohol wipe. 5. Connect the catheter tube to the drainage tube of the clean drainage bag. 6. Attach the new bag to the leg with tape or a leg strap. Avoid attaching the new bag too tightly. 7. Wash your hands well. Cleaning the Drainage Bag 1. Wash your hands with soap and water. 2. Wash the bag in warm, soapy water. 3. Rinse the bag with warm water. 4. Fill the bag with a mixture of white vinegar and water (1 cup vinegar to 1 quart warm water [.2 liter vinegar to 1 liter warm water]). Close the bag and soak it for 30 minutes in the solution. 5. Rinse  the bag with warm water. 6. Hang the bag to dry with the pour spout open and hanging downward. 7. Store the clean bag (once it is dry) in a clean plastic bag. 8. Wash your hands well. PREVENT INFECTION  Wash your hands before and after touching your tube.  Take showers every day. Wash the skin where the tube enters your body. Do not take baths. Replace wet leg straps with dry ones, if this applies.  Do not use powders, sprays, or lotions on the genital area. Only use creams, lotions, or ointments as told by your doctor.  For females, wipe from front to back after going to the bathroom.  Drink enough fluids to keep your pee clear or pale yellow unless you are told not to have too much fluid (fluid restriction).  Do not let the drainage bag or tubing touch or lie on the floor.  Wear cotton underwear to keep the area dry. GET HELP IF:  Your pee is cloudy or smells unusually bad.  Your tube becomes clogged.  You are not draining pee into the bag or your bladder feels full.  Your tube starts to leak. GET HELP RIGHT AWAY IF:  You have pain, puffiness (swelling), redness, or yellowish-white fluid (pus) where the tube enters the body.  You have pain in the belly (abdomen), legs, lower back, or bladder.  You have a fever.  You see blood fill the tube, or your pee is pink or red.  You feel sick to your stomach (nauseous), throw up (vomit), or have chills.  Your tube gets pulled out. MAKE SURE YOU:   Understand these instructions.  Will watch your condition.  Will get help right away if you are not doing well or get worse.   This information is not intended to replace advice given to you by your health care provider. Make sure you discuss any questions you have with your health care provider.   Document Released: 12/31/2012 Document Revised: 09/26/2014 Document Reviewed: 12/31/2012 Elsevier Interactive Patient Education 2016 [place]  Instructions  Activity: Get plenty of rest for the remainder of the day. A responsible adult should stay with you for 24 hours following the procedure.  For the next 24 hours, DO NOT: -Drive a car -Paediatric nurse -Drink alcoholic beverages -Take any medication unless instructed by your physician -Make any legal decisions or sign important papers.  Meals: Start with liquid foods such as gelatin or soup. Progress to regular foods as tolerated. Avoid greasy, spicy,  heavy foods. If nausea and/or vomiting occur, drink only clear liquids until the nausea and/or vomiting subsides. Call your physician if vomiting continues.  Special Instructions/Symptoms: Your throat may feel dry or sore from the anesthesia or the breathing tube placed in your throat during surgery. If this causes discomfort, gargle with warm salt water. The discomfort should disappear within 24 hours.  If you had a scopolamine patch placed behind your ear for the management of post- operative nausea and/or vomiting:  1. The medication in the patch is effective for 72 hours, after which it should be removed.  Wrap patch in a tissue and discard in the trash. Wash hands thoroughly with soap and water. 2. You may remove the patch earlier than 72 hours if you experience unpleasant side effects which may include dry mouth, dizziness or visual disturbances. 3. Avoid touching the patch. Wash your hands with soap and water after contact with the patch.

## 2015-12-14 NOTE — Anesthesia Procedure Notes (Signed)
Procedure Name: LMA Insertion Date/Time: 12/14/2015 8:53 AM Performed by: Lyndee Leo Pre-anesthesia Checklist: Patient identified, Emergency Drugs available, Suction available and Patient being monitored Patient Re-evaluated:Patient Re-evaluated prior to inductionOxygen Delivery Method: Circle System Utilized Preoxygenation: Pre-oxygenation with 100% oxygen Intubation Type: IV induction Ventilation: Mask ventilation without difficulty LMA: LMA inserted LMA Size: 4.0 Number of attempts: 1 Airway Equipment and Method: Bite block Placement Confirmation: positive ETCO2 Tube secured with: Tape Dental Injury: Teeth and Oropharynx as per pre-operative assessment

## 2015-12-15 ENCOUNTER — Encounter (HOSPITAL_BASED_OUTPATIENT_CLINIC_OR_DEPARTMENT_OTHER): Payer: Self-pay | Admitting: Urology

## 2015-12-16 DIAGNOSIS — N320 Bladder-neck obstruction: Secondary | ICD-10-CM | POA: Diagnosis not present

## 2015-12-17 DIAGNOSIS — R39198 Other difficulties with micturition: Secondary | ICD-10-CM | POA: Diagnosis not present

## 2016-01-03 ENCOUNTER — Emergency Department: Payer: PPO

## 2016-01-03 ENCOUNTER — Emergency Department
Admission: EM | Admit: 2016-01-03 | Discharge: 2016-01-04 | Disposition: A | Discharge status: Home / Self Care | Payer: PPO | Attending: Emergency Medicine | Admitting: Emergency Medicine

## 2016-01-03 ENCOUNTER — Encounter: Payer: Self-pay | Admitting: Emergency Medicine

## 2016-01-03 DIAGNOSIS — M1990 Unspecified osteoarthritis, unspecified site: Secondary | ICD-10-CM | POA: Diagnosis not present

## 2016-01-03 DIAGNOSIS — I10 Essential (primary) hypertension: Secondary | ICD-10-CM | POA: Diagnosis not present

## 2016-01-03 DIAGNOSIS — R2241 Localized swelling, mass and lump, right lower limb: Secondary | ICD-10-CM | POA: Diagnosis not present

## 2016-01-03 DIAGNOSIS — M79661 Pain in right lower leg: Secondary | ICD-10-CM

## 2016-01-03 DIAGNOSIS — I82811 Embolism and thrombosis of superficial veins of right lower extremities: Secondary | ICD-10-CM | POA: Diagnosis not present

## 2016-01-03 DIAGNOSIS — Z8546 Personal history of malignant neoplasm of prostate: Secondary | ICD-10-CM | POA: Insufficient documentation

## 2016-01-03 DIAGNOSIS — I82890 Acute embolism and thrombosis of other specified veins: Secondary | ICD-10-CM

## 2016-01-03 DIAGNOSIS — Z79899 Other long term (current) drug therapy: Secondary | ICD-10-CM | POA: Insufficient documentation

## 2016-01-03 LAB — CBC
HCT: 44.6 % (ref 40.0–52.0)
Hemoglobin: 14.7 g/dL (ref 13.0–18.0)
MCH: 30.5 pg (ref 26.0–34.0)
MCHC: 33.1 g/dL (ref 32.0–36.0)
MCV: 92.3 fL (ref 80.0–100.0)
PLATELETS: 180 10*3/uL (ref 150–440)
RBC: 4.83 MIL/uL (ref 4.40–5.90)
RDW: 16.2 % — AB (ref 11.5–14.5)
WBC: 11.6 10*3/uL — AB (ref 3.8–10.6)

## 2016-01-03 LAB — BASIC METABOLIC PANEL
Anion gap: 4 — ABNORMAL LOW (ref 5–15)
BUN: 25 mg/dL — AB (ref 6–20)
CALCIUM: 8.7 mg/dL — AB (ref 8.9–10.3)
CO2: 23 mmol/L (ref 22–32)
CREATININE: 1.4 mg/dL — AB (ref 0.61–1.24)
Chloride: 110 mmol/L (ref 101–111)
GFR calc Af Amer: 58 mL/min — ABNORMAL LOW (ref >60)
GFR, EST NON AFRICAN AMERICAN: 50 mL/min — AB (ref >60)
GLUCOSE: 152 mg/dL — AB (ref 65–99)
Potassium: 4.5 mmol/L (ref 3.5–5.1)
SODIUM: 137 mmol/L (ref 135–145)

## 2016-01-03 NOTE — ED Notes (Signed)
Pt has redness behind his rt knee - warm to the touch.

## 2016-01-04 MED ORDER — ETODOLAC 200 MG PO CAPS: 200.0000 mg | ORAL_CAPSULE | Freq: Three times a day (TID) | ORAL | Status: DC

## 2016-01-04 MED ORDER — IBUPROFEN 800 MG PO TABS
800.0000 mg | ORAL_TABLET | Freq: Once | ORAL | Status: AC
  Administered 2016-01-04: 800 mg via ORAL
  Filled 2016-01-04: qty 1

## 2016-01-04 NOTE — ED Notes (Signed)
Erythema noted to posterior R calf that is tender to touch.

## 2016-01-04 NOTE — ED Provider Notes (Signed)
[facility] Emergency Department Provider Note  ____________________________________________  Time seen: Approximately 0014 AM  I have reviewed the triage vital signs and the nursing notes.   HISTORY  Chief Complaint Leg Swelling    HPI Phillip [name] is a 70 y.o. male comes into the hospital today with pain and redness in his right calf. The patient reports that he was concerned that it may be a blood clot so he decided to come into the hospital to get checked out. The patient reports that the symptoms started on Thursday or Friday but is been more painful today. He reports he has not taken anything for the pain. He has pain to the touch and when walking but not when he is laying still. He reports that when he touches it is 5-6 out of 10 in intensity. The patient has never had these symptoms before. He's never had a clot in his legs and he denies any chest pain shortness of breath or dizziness. The patient reports that he recently completed a round of steroids after receiving a bladder procedure. The patient was concerned about the symptoms and decided to come into the hospital to get checked out.   Past Medical History  Diagnosis Date  . ED (erectile dysfunction)   . Hypertension   . Dyslipidemia   . GERD (gastroesophageal reflux disease)   . Nocturia   . Prostate cancer ([place])     S/P   CRYOABLATION  . Bladder neck contracture   . Right elbow tendinitis   . Frequency   . Incontinence of urine     sui, s/p cryoablation  . Wears glasses   . Arthritis   . Cataract     There are no active problems to display for this patient.   Past Surgical History  Procedure Laterality Date  . Prostate cryoablation  06-01-2012  [facility]  . Arthroplasty and tendon repair left thumb  JAN 2014  . Penile prosthesis implant N/A 12/06/2013    Procedure: PENILE PROTHESIS INFLATABLE;  Surgeon: Ailene Rud, MD;  Location: [facility];  Service:  Urology;  Laterality: N/A;  . Transurethral resection of bladder neck N/A 03/20/2015    Procedure: RELEASE BLADDER NECK CONTRACTURE  WITH WOLF BUTTON ELECTRODE ;  Surgeon: Carolan Clines, MD;  Location: [place];  Service: Urology;  Laterality: N/A;  . Trigger finger release Left 10/2015  . Green light laser turp (transurethral resection of prostate N/A 12/14/2015    Procedure: GREEN LIGHT LASER TURP (TRANSURETHRAL RESECTION OF PROSTATE) LASER OF BLADDER NECK CONTRACTURE;  Surgeon: Carolan Clines, MD;  Location: [place];  Service: Urology;  Laterality: N/A;    Current Outpatient Rx  Name  Route  Sig  Dispense  Refill  . amLODipine (NORVASC) 5 MG tablet   Oral   Take 5 mg by mouth daily.         Marland Kitchen amLODipine-benazepril (LOTREL) 10-20 MG per capsule   Oral   Take 1 capsule by mouth every morning.          . benazepril (LOTENSIN) 20 MG tablet   Oral   Take 20 mg by mouth daily.         Marland Kitchen etodolac (LODINE) 200 MG capsule   Oral   Take 1 capsule (200 mg total) by mouth every 8 (eight) hours.   12 capsule   0   . Glucosamine-Chondroit-Vit C-Mn (GLUCOSAMINE CHONDR 1500 COMPLX PO)   Oral   Take  1 tablet by mouth daily.         . Multiple Vitamin (MULTIVITAMIN) tablet   Oral   Take 1 tablet by mouth daily.         . naproxen sodium (ANAPROX) 220 MG tablet   Oral   Take 220 mg by mouth as needed.         . niacin 500 MG tablet   Oral   Take 500 mg by mouth at bedtime.         Marland Kitchen omeprazole (PRILOSEC) 20 MG capsule   Oral   Take 20 mg by mouth every morning.          Marland Kitchen oxyCODONE-acetaminophen (ROXICET) 5-325 MG per tablet   Oral   Take 2 tablets by mouth every 4 (four) hours as needed for severe pain.   20 tablet   0   . phenazopyridine (PYRIDIUM) 200 MG tablet   Oral   Take 1 tablet (200 mg total) by mouth 3 (three) times daily as needed for pain.   10 tablet   0   . phenazopyridine (PYRIDIUM) 200 MG tablet    Oral   Take 1 tablet (200 mg total) by mouth 3 (three) times daily as needed for pain.   30 tablet   3   . potassium phosphate, monobasic, (K-PHOS ORIGINAL) 500 MG tablet   Oral   Take 500 mg by mouth daily.         . traMADol-acetaminophen (ULTRACET) 37.5-325 MG tablet   Oral   Take 1 tablet by mouth every 6 (six) hours as needed.   30 tablet   0   . vitamin C (ASCORBIC ACID) 500 MG tablet   Oral   Take 500 mg by mouth daily.           Allergies Review of patient's allergies indicates no known allergies.  History reviewed. No pertinent family history.  Social History Social History  Substance Use Topics  . Smoking status: Never Smoker   . Smokeless tobacco: Never Used  . Alcohol Use: No    Review of Systems Constitutional: No fever/chills Eyes: No visual changes. ENT: No sore throat. Cardiovascular: Denies chest pain. Respiratory: Denies shortness of breath. Gastrointestinal: No abdominal pain.  No nausea, no vomiting.  No diarrhea.  No constipation. Genitourinary: Negative for dysuria. Musculoskeletal: Right leg pain Skin: Redness behind right calf Neurological: Negative for headaches, focal weakness or numbness.  10-point ROS otherwise negative.  ____________________________________________   PHYSICAL EXAM:  VITAL SIGNS: ED Triage Vitals  Enc Vitals Group     BP 01/03/16 2118 132/66 mmHg     Pulse Rate 01/03/16 2118 65     Resp 01/03/16 2118 16     Temp --      Temp src --      SpO2 01/03/16 2118 96 %     Weight 01/03/16 2118 180 lb (81.647 kg)     Height 01/03/16 2118 5\' 9"  (1.753 m)     Head Cir --      Peak Flow --      Pain Score 01/03/16 2119 5     Pain Loc --      Pain Edu? --      Excl. in [place]? --     Constitutional: Alert and oriented. Well appearing and in Mild distress. Eyes: Conjunctivae are normal. PERRL. EOMI. Head: Atraumatic. Nose: No congestion/rhinnorhea. Mouth/Throat: Mucous membranes are moist.  Oropharynx  non-erythematous. Cardiovascular: Normal rate, regular rhythm. Grossly normal  heart sounds.  Good peripheral circulation. Respiratory: Normal respiratory effort.  No retractions. Lungs CTAB. Gastrointestinal: Soft and nontender. No distention. Positive bowel sounds Musculoskeletal: Numbness to palpation along right calf an area of redness with no palpable cords.  Neurologic:  Normal speech and language.  Skin:  Skin is warm, dry and intact.  Psychiatric: Mood and affect are normal.   ____________________________________________   LABS (all labs ordered are listed, but only abnormal results are displayed)  Labs Reviewed  BASIC METABOLIC PANEL - Abnormal; Notable for the following:    Glucose, Bld 152 (*)    BUN 25 (*)    Creatinine, Ser 1.40 (*)    Calcium 8.7 (*)    GFR calc non Af Amer 50 (*)    GFR calc Af Amer 58 (*)    Anion gap 4 (*)    All other components within normal limits  CBC - Abnormal; Notable for the following:    WBC 11.6 (*)    RDW 16.2 (*)    All other components within normal limits   ____________________________________________  EKG  None ____________________________________________  RADIOLOGY  Right lower extremity ultrasound: No evidence of deep venous thrombosis, superficial venous thrombosis is demonstrated in the greater saphenous vein corresponding to the area of redness and pain. ____________________________________________   PROCEDURES  Procedure(s) performed: None  Critical Care performed: No  ____________________________________________   INITIAL IMPRESSION / ASSESSMENT AND PLAN / ED COURSE  Pertinent labs & imaging results that were available during my care of the patient were reviewed by me and considered in my medical decision making (see chart for details).  This is a 70 year old male who comes in with some calf pain with redness and swelling with a concern for a clot. The patient appears to have a superficial venous thrombosis  with no visualized DVT. I will give the patient some ibuprofen and encouraged him to elevate and place cool compresses to his leg. I will also encourage the patient to follow back up with his primary care physician to further evaluate if that should extend into a DVT. The patient understands and agrees with the plan as stated. He will be discharged home to follow-up with Dr. Sabra Heck his primary care physician. ____________________________________________   FINAL CLINICAL IMPRESSION(S) / ED DIAGNOSES  Final diagnoses:  Superficial vein thrombosis      Loney Hering, MD 01/04/16 [phone]

## 2016-01-04 NOTE — Discharge Instructions (Signed)
Please follow up with Dr. Sabra Heck to further evaluate your leg. While you do not have a DVT or deep vein clot at this time it is still possible that one may develop. While you are at rest please elevate your leg and place a cool compress on your leg. Please also use some compression to your leg as well. The symptoms should improve in a few days and resolve within the week. Should they remain please follow up with your primary care physician sooner or return to the Emergency Department.

## 2016-01-06 DIAGNOSIS — R3912 Poor urinary stream: Secondary | ICD-10-CM | POA: Diagnosis not present

## 2016-01-06 DIAGNOSIS — N390 Urinary tract infection, site not specified: Secondary | ICD-10-CM | POA: Diagnosis not present

## 2016-01-06 DIAGNOSIS — Z0000 Encounter for general adult medical examination without abnormal findings: Secondary | ICD-10-CM | POA: Diagnosis not present

## 2016-01-07 DIAGNOSIS — R399 Unspecified symptoms and signs involving the genitourinary system: Secondary | ICD-10-CM | POA: Diagnosis not present

## 2016-01-07 DIAGNOSIS — I8001 Phlebitis and thrombophlebitis of superficial vessels of right lower extremity: Secondary | ICD-10-CM | POA: Diagnosis not present

## 2016-01-12 DIAGNOSIS — N3001 Acute cystitis with hematuria: Secondary | ICD-10-CM | POA: Diagnosis not present

## 2016-01-28 DIAGNOSIS — R3989 Other symptoms and signs involving the genitourinary system: Secondary | ICD-10-CM | POA: Diagnosis not present

## 2016-01-28 DIAGNOSIS — Z0000 Encounter for general adult medical examination without abnormal findings: Secondary | ICD-10-CM | POA: Diagnosis not present

## 2016-01-28 DIAGNOSIS — N390 Urinary tract infection, site not specified: Secondary | ICD-10-CM | POA: Diagnosis not present

## 2016-01-28 DIAGNOSIS — N3941 Urge incontinence: Secondary | ICD-10-CM | POA: Diagnosis not present

## 2016-02-02 DIAGNOSIS — N320 Bladder-neck obstruction: Secondary | ICD-10-CM | POA: Diagnosis not present

## 2016-02-02 DIAGNOSIS — Z0000 Encounter for general adult medical examination without abnormal findings: Secondary | ICD-10-CM | POA: Diagnosis not present

## 2016-02-02 DIAGNOSIS — N3941 Urge incontinence: Secondary | ICD-10-CM | POA: Diagnosis not present

## 2016-02-02 DIAGNOSIS — R81 Glycosuria: Secondary | ICD-10-CM | POA: Diagnosis not present

## 2016-02-02 DIAGNOSIS — R3912 Poor urinary stream: Secondary | ICD-10-CM | POA: Diagnosis not present

## 2016-02-02 DIAGNOSIS — E291 Testicular hypofunction: Secondary | ICD-10-CM | POA: Diagnosis not present

## 2016-03-24 DIAGNOSIS — Z125 Encounter for screening for malignant neoplasm of prostate: Secondary | ICD-10-CM | POA: Diagnosis not present

## 2016-03-24 DIAGNOSIS — Z0000 Encounter for general adult medical examination without abnormal findings: Secondary | ICD-10-CM | POA: Diagnosis not present

## 2016-03-24 DIAGNOSIS — R739 Hyperglycemia, unspecified: Secondary | ICD-10-CM | POA: Diagnosis not present

## 2016-03-30 DIAGNOSIS — Z0000 Encounter for general adult medical examination without abnormal findings: Secondary | ICD-10-CM | POA: Diagnosis not present

## 2016-04-05 DIAGNOSIS — R3911 Hesitancy of micturition: Secondary | ICD-10-CM | POA: Diagnosis not present

## 2016-04-05 DIAGNOSIS — N5201 Erectile dysfunction due to arterial insufficiency: Secondary | ICD-10-CM | POA: Diagnosis not present

## 2016-04-05 DIAGNOSIS — C61 Malignant neoplasm of prostate: Secondary | ICD-10-CM | POA: Diagnosis not present

## 2016-04-05 DIAGNOSIS — E291 Testicular hypofunction: Secondary | ICD-10-CM | POA: Diagnosis not present

## 2016-04-05 DIAGNOSIS — N401 Enlarged prostate with lower urinary tract symptoms: Secondary | ICD-10-CM | POA: Diagnosis not present

## 2016-06-07 DIAGNOSIS — B9689 Other specified bacterial agents as the cause of diseases classified elsewhere: Secondary | ICD-10-CM | POA: Diagnosis not present

## 2016-06-07 DIAGNOSIS — J019 Acute sinusitis, unspecified: Secondary | ICD-10-CM | POA: Diagnosis not present

## 2016-06-07 DIAGNOSIS — J340 Abscess, furuncle and carbuncle of nose: Secondary | ICD-10-CM | POA: Diagnosis not present

## 2016-06-29 DIAGNOSIS — Z125 Encounter for screening for malignant neoplasm of prostate: Secondary | ICD-10-CM | POA: Diagnosis not present

## 2016-07-06 DIAGNOSIS — N5201 Erectile dysfunction due to arterial insufficiency: Secondary | ICD-10-CM | POA: Diagnosis not present

## 2016-07-06 DIAGNOSIS — N401 Enlarged prostate with lower urinary tract symptoms: Secondary | ICD-10-CM | POA: Diagnosis not present

## 2016-07-06 DIAGNOSIS — N320 Bladder-neck obstruction: Secondary | ICD-10-CM | POA: Diagnosis not present

## 2016-07-06 DIAGNOSIS — C61 Malignant neoplasm of prostate: Secondary | ICD-10-CM | POA: Diagnosis not present

## 2016-07-08 DIAGNOSIS — H2513 Age-related nuclear cataract, bilateral: Secondary | ICD-10-CM | POA: Diagnosis not present

## 2016-07-29 DIAGNOSIS — X32XXXA Exposure to sunlight, initial encounter: Secondary | ICD-10-CM | POA: Diagnosis not present

## 2016-07-29 DIAGNOSIS — L821 Other seborrheic keratosis: Secondary | ICD-10-CM | POA: Diagnosis not present

## 2016-07-29 DIAGNOSIS — D2371 Other benign neoplasm of skin of right lower limb, including hip: Secondary | ICD-10-CM | POA: Diagnosis not present

## 2016-07-29 DIAGNOSIS — L538 Other specified erythematous conditions: Secondary | ICD-10-CM | POA: Diagnosis not present

## 2016-07-29 DIAGNOSIS — L570 Actinic keratosis: Secondary | ICD-10-CM | POA: Diagnosis not present

## 2016-07-29 DIAGNOSIS — D1801 Hemangioma of skin and subcutaneous tissue: Secondary | ICD-10-CM | POA: Diagnosis not present

## 2016-07-29 DIAGNOSIS — D2372 Other benign neoplasm of skin of left lower limb, including hip: Secondary | ICD-10-CM | POA: Diagnosis not present

## 2016-07-29 DIAGNOSIS — L820 Inflamed seborrheic keratosis: Secondary | ICD-10-CM | POA: Diagnosis not present

## 2016-08-15 ENCOUNTER — Encounter: Payer: Self-pay | Admitting: Podiatry

## 2016-08-15 ENCOUNTER — Ambulatory Visit (INDEPENDENT_AMBULATORY_CARE_PROVIDER_SITE_OTHER): Payer: PPO

## 2016-08-15 ENCOUNTER — Ambulatory Visit (INDEPENDENT_AMBULATORY_CARE_PROVIDER_SITE_OTHER): Payer: PPO | Admitting: Podiatry

## 2016-08-15 DIAGNOSIS — M722 Plantar fascial fibromatosis: Secondary | ICD-10-CM

## 2016-08-15 NOTE — Progress Notes (Signed)
   Subjective:    Patient ID: Phillip [name], male    DOB: 03-Jun-1946, 70 y.o.   MRN: [id number]  HPI: He presents today with a chief complaint of pain to his left heel which is been bothering him for more than a year or so he states that it started August 2016. He has been treated by other doctors to no avail.Marland Kitchen He states that pain in the mornings. Worse after he's been standing on his feet. It has now progressed to pain and numbness. when on admission trip last year and noticed that he started than he states that he was wearing bad shoes at that time. He has now developed numbness in the plantar aspect of the left foot. He saw a podiatrist in the past 2 injected the heel with very little relief. He recommended our supports and ice and he really hasn't gotten any better.    Review of Systems  All other systems reviewed and are negative.      Objective:   Physical Exam: Vital signs are stable alert and oriented 3. Pulses are palpable. Neurologic sensorium is intact. Deep tendon reflexes are intact. Muscle strength is 5 over 5 dorsiflexion and plantar flexors and inverters everters. Orthopedic evaluation demonstrates all joints distal to the ankle for range of motion without crepitation. He has pain on palpation medial calcaneal tubercle. He has a very firm almost hard area distal to the medial tuberosity or the plantar fascia is supposed to insert. However it feels very firm very hard here almost calcified much like a plantar fibroma. Radiographs taken today do not demonstrate any major osseous abnormalities nor does it demonstrate any calcification however soft tissue margins are obliterated along the plantar fascia as it inserts on the calcaneal tubercle. Cutaneous evaluation to states no open lesions or wounds.        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  Plan: Due to failure of conservative therapies for the past year and knowing that this is now a  possible surgical condition and MRI is indicated. We have requested an MRI with contrast to evaluate for soft tissue lesion as well on the plantar aspect of the foot.

## 2016-08-16 ENCOUNTER — Telehealth: Payer: Self-pay | Admitting: *Deleted

## 2016-08-16 LAB — BUN+CREAT
BUN / CREAT RATIO: 16 (ref 10–24)
BUN: 21 mg/dL (ref 8–27)
Creatinine, Ser: 1.3 mg/dL — ABNORMAL HIGH (ref 0.76–1.27)
GFR, EST AFRICAN AMERICAN: 64 mL/min/{1.73_m2} (ref >59)
GFR, EST NON AFRICAN AMERICAN: 56 mL/min/{1.73_m2} — AB (ref >59)

## 2016-08-16 NOTE — Telephone Encounter (Addendum)
-----   Message from [place] sent at 08/15/2016  3:52 PM EST ----- Regarding: MRI Orders are in! Thanks! 08/16/2016-Faxed orders to [facility].

## 2016-08-19 NOTE — Telephone Encounter (Addendum)
-----   Message from Garrel Ridgel, [place] sent at 08/16/2016  6:52 AM EST ----- Ok to have CT with contrast. 08/19/2016-Informed [facility] scheduling that Dr. Milinda Pointer said it was okay to perform the MRI he ordered. 08/26/2016-Pt called left message to call him concerning MRI schedule. Left message informing pt I would call again. I called again and pt states he has had his MRI. I reviewed for the results and they were available. I told pt I would transfer to schedulers to get him in as soon as possible since he said he wanted to get in soon because he was uncomfortable. Transferred to schedulers.

## 2016-08-25 ENCOUNTER — Telehealth: Payer: Self-pay | Admitting: *Deleted

## 2016-08-25 NOTE — Telephone Encounter (Signed)
"  I'm calling about Phillip [name].  He's coming in tomorrow for a MRI of left foot with and without contrast.  It requires authorization from [employer]. We need this authorization by 2 pm today.  Please give me a call."

## 2016-08-25 NOTE — Telephone Encounter (Signed)
"  I was scheduled for a MRI tomorrow.  I just got a message at 4:30 pm that it is being canceled.  My foot is hurting me.  I was told that you all have not gotten authorization from [employer].  What's the problem?"  I apologize sir.  I did not realize until today that you were scheduled.  They called today and informed me that it needed to be authorized.  "Someone called and spoke to my wife and said $200 needed to be paid time of the service.  I have paid for this.  Why wasn't this taken care of prior to now.  You know what type of insurance I have.  Shouldn't the protocol be to get things authorized in advance before the time of service.  I have rearranged my schedule and everything.  My foot is still hurting.  Now I have to wait even longer.  What else do I need to do?"  I am so sorry sir I will fax [facility] your clinicals today and once we receive the authorization I will let the imagining people know.  They will give you a call to get it rescheduled.  "I hope you let the powers that be know that a better protocol needs to be arranged."  I will let Dr. Milinda Pointer know as well as my Freight forwarder.

## 2016-08-26 ENCOUNTER — Ambulatory Visit
Admission: RE | Admit: 2016-08-26 | Discharge: 2016-08-26 | Disposition: A | Discharge status: Home / Self Care | Payer: PPO | Source: Ambulatory Visit | Attending: Podiatry | Admitting: Podiatry

## 2016-08-26 ENCOUNTER — Telehealth: Payer: Self-pay | Admitting: *Deleted

## 2016-08-26 DIAGNOSIS — M7662 Achilles tendinitis, left leg: Secondary | ICD-10-CM | POA: Insufficient documentation

## 2016-08-26 DIAGNOSIS — M722 Plantar fascial fibromatosis: Secondary | ICD-10-CM

## 2016-08-26 DIAGNOSIS — M76822 Posterior tibial tendinitis, left leg: Secondary | ICD-10-CM | POA: Diagnosis not present

## 2016-08-26 DIAGNOSIS — R200 Anesthesia of skin: Secondary | ICD-10-CM | POA: Diagnosis not present

## 2016-08-26 MED ORDER — GADOBENATE DIMEGLUMINE 529 MG/ML IV SOLN
15.0000 mL | Freq: Once | INTRAVENOUS | Status: AC | PRN
  Administered 2016-08-26: 15 mL via INTRAVENOUS

## 2016-08-26 NOTE — Telephone Encounter (Signed)
I called and left a message for Tillie Rung at [facility] letting her know patient's MRI was authorized.  The authorization number is

## 2016-08-26 NOTE — Telephone Encounter (Signed)
I am calling to let you know we got the authorization.  "Ma'am I have already had the MRI.  I had it done at 12 noon.  I have scheduled an appointment to see him next week."

## 2016-08-28 NOTE — Telephone Encounter (Signed)
ok 

## 2016-08-31 ENCOUNTER — Encounter: Payer: Self-pay | Admitting: Podiatry

## 2016-08-31 ENCOUNTER — Ambulatory Visit (INDEPENDENT_AMBULATORY_CARE_PROVIDER_SITE_OTHER): Payer: PPO | Admitting: Podiatry

## 2016-08-31 DIAGNOSIS — M722 Plantar fascial fibromatosis: Secondary | ICD-10-CM | POA: Diagnosis not present

## 2016-08-31 NOTE — Progress Notes (Signed)
He presents today for follow-up of his painful fibroma plantar aspect of the left lip. He is here for his MRI results.  Objective: MRI states that he has a plantar fibroma centrally located medial longitudinal arch in the medial band of plantar fascia measuring 3.4 cm in diameter 1.5 cm in total thickness and it begins to 0.6 cm distal to the heel.  Assessment: Plantar fibroma left foot.  Plan: Consented him today for excision of plantar fibroma left foot. He understands this is amenable to it. We did discuss possible postop patient's which may include but are not limited to postop pain bleeding swelling infection recurrence and need for further surgery overcorrection under correction significant blood loss significant tissue loss. Numbness tingling loss of sensation to the toes. Loss of digit loss of limb loss of life. He understands that he will be nonweightbearing for a period of at least 21 days utilizing crutches or knee scooter. I will follow-up with him at the time of surgery.

## 2016-09-01 NOTE — Telephone Encounter (Signed)
"  I would like to schedule my surgery with Dr. Milinda Pointer."  Do you have a date in mind?  He doesn't have anything until January.  "Can he do it on January 5 or 12?"  He can do it on January 12.  "I'll take it."

## 2016-09-06 ENCOUNTER — Telehealth: Payer: Self-pay | Admitting: *Deleted

## 2016-09-06 NOTE — Telephone Encounter (Signed)
"  Dr. Milinda Pointer wanted me to call you and get scheduled for surgery on my left foot for Plantar Fibroma."

## 2016-09-14 ENCOUNTER — Ambulatory Visit: Payer: PPO | Admitting: Podiatry

## 2016-09-20 ENCOUNTER — Telehealth: Payer: Self-pay | Admitting: *Deleted

## 2016-09-20 NOTE — Telephone Encounter (Signed)
"  I'm scheduled for surgery on January 12.  I forgot to ask if I could be put on the waiting list for January 5.  I know I am far fetching but it doesn't hurt to try."  Okay, I will put you on a waiting list.

## 2016-09-22 NOTE — Telephone Encounter (Signed)
Patient was scheduled for January 12.

## 2016-09-29 ENCOUNTER — Other Ambulatory Visit: Payer: Self-pay | Admitting: Podiatry

## 2016-09-29 MED ORDER — HYDROMORPHONE HCL 4 MG PO TABS: 4.0000 mg | ORAL_TABLET | ORAL | 0 refills | Status: DC | PRN

## 2016-09-29 MED ORDER — CEPHALEXIN 500 MG PO CAPS: 500.0000 mg | ORAL_CAPSULE | Freq: Three times a day (TID) | ORAL | 0 refills | Status: DC

## 2016-09-29 MED ORDER — ONDANSETRON HCL 4 MG PO TABS: 4.0000 mg | ORAL_TABLET | Freq: Three times a day (TID) | ORAL | 0 refills | Status: DC | PRN

## 2016-09-30 ENCOUNTER — Encounter: Payer: Self-pay | Admitting: Podiatry

## 2016-09-30 DIAGNOSIS — D2122 Benign neoplasm of connective and other soft tissue of left lower limb, including hip: Secondary | ICD-10-CM | POA: Diagnosis not present

## 2016-09-30 DIAGNOSIS — M722 Plantar fascial fibromatosis: Secondary | ICD-10-CM | POA: Diagnosis not present

## 2016-09-30 DIAGNOSIS — I10 Essential (primary) hypertension: Secondary | ICD-10-CM | POA: Diagnosis not present

## 2016-09-30 DIAGNOSIS — M25572 Pain in left ankle and joints of left foot: Secondary | ICD-10-CM | POA: Diagnosis not present

## 2016-10-03 ENCOUNTER — Telehealth: Payer: Self-pay

## 2016-10-03 NOTE — Telephone Encounter (Signed)
Spoke with pt regarding post op status. He states that he is doing well, and managing pain effectivley. He denied and s/s of infection or fever. He did c/o mild GI upset when taking Keflex. Advised him to take with food, and if symptoms worsened or persisted he is to call back with further instructions. Also advised on continuation with boot usage, rest, ice and elevate

## 2016-10-04 ENCOUNTER — Encounter: Payer: Self-pay | Admitting: Podiatry

## 2016-10-04 ENCOUNTER — Ambulatory Visit (INDEPENDENT_AMBULATORY_CARE_PROVIDER_SITE_OTHER): Payer: Self-pay | Admitting: Podiatry

## 2016-10-04 VITALS — BP 128/80 | HR 71 | Resp 16

## 2016-10-04 DIAGNOSIS — Z9889 Other specified postprocedural states: Secondary | ICD-10-CM

## 2016-10-04 DIAGNOSIS — M722 Plantar fascial fibromatosis: Secondary | ICD-10-CM

## 2016-10-05 ENCOUNTER — Encounter: Payer: PPO | Admitting: Podiatry

## 2016-10-05 NOTE — Progress Notes (Signed)
He presents today for his postop visit. This is his first postop visit date of surgery 09/30/2016 for resection of a plantar fibroma plantar aspect of his left foot. We also resected a wart. He presents today nonweightbearing fashion with crutches. He denies chest pain shortness of breath Pain. He states it has been very comfortable and then his block to the sciatic nerve is just worn off.  Objective: Vital signs are stable he is alert and oriented 3. Pulses are palpable. Dry sterile dressing was removed demonstrates severe amount of bleeding around the wart site. However he does have a drain intact 4 vials of blood since last Friday. Once all the dressing was removed the suture holding the drain and was removed and the drain was removed in toto. Staples are intact margins are well coapted sutures are intact as well does no signs of erythema cellulitis drainage or odor. He has normal sensation without numbness or tingling.  Assessment: Well-healing surgical foot nearly 1 week status post extensive excision of plantar fibroma.  Plan: I will follow-up with him in 1-2 weeks sutures and staples are not to come out until he is at least 21 days postop. Redress today dressing compressive dressing continue nonweightbearing status. Continue to keep elevated dry and clean.

## 2016-10-10 ENCOUNTER — Encounter: Payer: Self-pay | Admitting: Podiatry

## 2016-10-12 ENCOUNTER — Ambulatory Visit (INDEPENDENT_AMBULATORY_CARE_PROVIDER_SITE_OTHER): Payer: Self-pay | Admitting: Podiatry

## 2016-10-12 ENCOUNTER — Encounter: Payer: Self-pay | Admitting: Podiatry

## 2016-10-12 DIAGNOSIS — M722 Plantar fascial fibromatosis: Secondary | ICD-10-CM

## 2016-10-12 DIAGNOSIS — Z9889 Other specified postprocedural states: Secondary | ICD-10-CM

## 2016-10-12 NOTE — Progress Notes (Signed)
Phillip [name] presents today for follow-up of excision of his plantar fibroma is now status post 2 weeks E date of surgery 09/30/2016 left foot. States that there is some discomfort but really no pain on doing very well as he continues to utilize the knee scooter and a Dispensing optician. He denies fever chills nausea vomiting muscle aches and pains. Shortness of breath and calf pain are not present.  Objective: No signs of infection staples are intact sutures are intact margins well coapted excision of the wart to the first metatarsophalangeal joint appears to be healing nicely. It is granulating in the localizing.  Assessment: Well-healing surgical foot status post 2 weeks.  Plan: At this point he was redressed today after compressive dressing will follow up with me in a little more than a week for staple removal.

## 2016-10-17 ENCOUNTER — Encounter: Payer: PPO | Admitting: Podiatry

## 2016-10-18 ENCOUNTER — Encounter: Payer: Self-pay | Admitting: Podiatry

## 2016-10-24 ENCOUNTER — Ambulatory Visit (INDEPENDENT_AMBULATORY_CARE_PROVIDER_SITE_OTHER): Payer: Self-pay | Admitting: Podiatry

## 2016-10-24 ENCOUNTER — Encounter: Payer: Self-pay | Admitting: Podiatry

## 2016-10-24 DIAGNOSIS — M722 Plantar fascial fibromatosis: Secondary | ICD-10-CM

## 2016-10-24 DIAGNOSIS — Z9889 Other specified postprocedural states: Secondary | ICD-10-CM

## 2016-10-24 NOTE — Progress Notes (Signed)
He presents today for follow-up of his surgical foot status post excision plantar fibromas left foot. He states that he really has not had any pain. Denies fever chills nausea vomiting muscle aches pains Pain shortness of breath or chest pain.  Objective: Vital signs are stable alert and oriented 3 pulses are palpable. She is intact staples are also intact margins appear to be well coapted with exception of one small area. I removed all the sutures and staples today with considerable discomfort even after applying a topical lidocaine cream. There is one small area that appears to have be slow healing but there is no P was no drainage no signs of infection.  Assessment: Well-healing excision plantar fibromas left foot.  Plan: Instructed him on partial weightbearing and I will allow him to get this foot wet but he is dissected and some warm water afterwards. i will follow-up with him in 2 weeks at which time we hope to be out of boot.

## 2016-10-27 NOTE — Progress Notes (Signed)
DOS 01.12.2018 Excision plantar fibroma left foot.

## 2016-11-03 ENCOUNTER — Encounter: Payer: Self-pay | Admitting: Podiatry

## 2016-11-03 ENCOUNTER — Ambulatory Visit (INDEPENDENT_AMBULATORY_CARE_PROVIDER_SITE_OTHER): Payer: Self-pay | Admitting: Podiatry

## 2016-11-03 DIAGNOSIS — M722 Plantar fascial fibromatosis: Secondary | ICD-10-CM

## 2016-11-03 NOTE — Progress Notes (Signed)
He presents today for follow-up of his plantar fibroma of the left foot. He states that it's doing great but he still does source of her first to the excision of the wart to the distal medial aspect of the first metatarsophalangeal joint.  Objective: Vital signs are stable he's alert and oriented 3. No erythema edema cellulitis drainage or odor margins appear to be well coapted.  Assessment: While in surgical foot left.  Plan: Encouraged S insults and water soaks application of antibiotic ointment to the excision site of the hallux wart. I'll follow-up with him in 2-3 weeks. We will allow him start walking on this with issue over the next week or 2

## 2016-11-07 ENCOUNTER — Encounter: Payer: PPO | Admitting: Podiatry

## 2016-11-20 DIAGNOSIS — M5431 Sciatica, right side: Secondary | ICD-10-CM | POA: Diagnosis not present

## 2016-11-28 ENCOUNTER — Ambulatory Visit (INDEPENDENT_AMBULATORY_CARE_PROVIDER_SITE_OTHER): Payer: PPO | Admitting: Podiatry

## 2016-11-28 ENCOUNTER — Encounter: Payer: Self-pay | Admitting: Podiatry

## 2016-11-28 DIAGNOSIS — M722 Plantar fascial fibromatosis: Secondary | ICD-10-CM

## 2016-11-28 NOTE — Progress Notes (Signed)
He presents today for follow-up of his fibroma plantar aspect left foot he states that the foot is still sore though surgery was just performed 09/30/2016.  Objective: Vital signs are stable he is alert and oriented 3 no reproducible pain on palpation of the left foot today. I imagine the majority of the pain that he feels is from stressing of the intrinsic musculature.  Assessment: All wounds involving to heal uneventfully.  Plan: Discussed etiology and pathology conservative versus surgical therapies I encouraged him to continue at home physical therapy will follow-up with him in 6-8 weeks if he needs it.

## 2017-01-04 DIAGNOSIS — N5201 Erectile dysfunction due to arterial insufficiency: Secondary | ICD-10-CM | POA: Diagnosis not present

## 2017-01-11 ENCOUNTER — Encounter: Payer: Self-pay | Admitting: Podiatry

## 2017-01-11 ENCOUNTER — Ambulatory Visit (INDEPENDENT_AMBULATORY_CARE_PROVIDER_SITE_OTHER): Payer: PPO | Admitting: Podiatry

## 2017-01-11 DIAGNOSIS — M722 Plantar fascial fibromatosis: Secondary | ICD-10-CM

## 2017-01-11 DIAGNOSIS — N3941 Urge incontinence: Secondary | ICD-10-CM | POA: Diagnosis not present

## 2017-01-11 DIAGNOSIS — N5201 Erectile dysfunction due to arterial insufficiency: Secondary | ICD-10-CM | POA: Diagnosis not present

## 2017-01-11 DIAGNOSIS — C61 Malignant neoplasm of prostate: Secondary | ICD-10-CM | POA: Diagnosis not present

## 2017-01-11 DIAGNOSIS — E291 Testicular hypofunction: Secondary | ICD-10-CM | POA: Diagnosis not present

## 2017-01-11 DIAGNOSIS — N401 Enlarged prostate with lower urinary tract symptoms: Secondary | ICD-10-CM | POA: Diagnosis not present

## 2017-01-11 NOTE — Progress Notes (Signed)
He presents today chief complaint of painful foot left. He is status post excision plantar fascia and plantar fibromas left foot. He states that he no longer hurts where the fibroma was however I'm having just as much pain on the plantar lateral aspect of my foot that I am at the surgical site.  Objective: Vital signs are stable he is alert and oriented 3. Pulses are strongly palpable. No pain on palpation along the surgical site. He does have pain between the third and fourth and the fourth and fifth metatarsals of the left foot fourth and fifth metatarsal area being the worst. The pain is between the metatarsals and plantarly. He is also having metatarsalgia.  Assessment: I feel the majority of his symptoms are associated with compensatory syndrome from favoring the medial aspect of the foot during the healing phases.  Plan: I injected the fourth fifth interspace today with Kenalog and local anesthetic to help alleviate his symptoms. I also placed him in a schedule for Liliane Channel to have him casted for orthotics. He will follow up with Liliane Channel in 1 week.

## 2017-01-16 ENCOUNTER — Ambulatory Visit: Payer: PPO | Admitting: *Deleted

## 2017-01-17 NOTE — Progress Notes (Signed)
.  h 

## 2017-01-19 ENCOUNTER — Ambulatory Visit: Payer: PPO

## 2017-02-02 ENCOUNTER — Other Ambulatory Visit: Payer: PPO

## 2017-02-02 ENCOUNTER — Ambulatory Visit (INDEPENDENT_AMBULATORY_CARE_PROVIDER_SITE_OTHER): Payer: Self-pay | Admitting: Podiatry

## 2017-02-02 DIAGNOSIS — M79673 Pain in unspecified foot: Secondary | ICD-10-CM

## 2017-02-02 DIAGNOSIS — M722 Plantar fascial fibromatosis: Secondary | ICD-10-CM

## 2017-02-02 NOTE — Progress Notes (Signed)
Patient picked up foot orthotics today in [place].  He had been upset that he had been unable to get updates upon the status/fabrication of foot orthotics after several attempts calling [place] office (one problem [facility] had name as BAKER, not [place]).Marland KitchenMarland KitchenMarland KitchenAlso Mr. Whilden was concerned he was paying a lot of $ for something he was unsure was going to help...due to all of his aggravation , I had tammy to charge him a discounted ADMIN rate on $275.000..he seemed pleased with that.

## 2017-02-23 ENCOUNTER — Ambulatory Visit: Payer: PPO

## 2017-03-05 DIAGNOSIS — J00 Acute nasopharyngitis [common cold]: Secondary | ICD-10-CM | POA: Diagnosis not present

## 2017-03-05 DIAGNOSIS — R0982 Postnasal drip: Secondary | ICD-10-CM | POA: Diagnosis not present

## 2017-03-13 DIAGNOSIS — J0100 Acute maxillary sinusitis, unspecified: Secondary | ICD-10-CM | POA: Diagnosis not present

## 2017-03-27 DIAGNOSIS — R739 Hyperglycemia, unspecified: Secondary | ICD-10-CM | POA: Diagnosis not present

## 2017-03-27 DIAGNOSIS — E782 Mixed hyperlipidemia: Secondary | ICD-10-CM | POA: Diagnosis not present

## 2017-03-27 DIAGNOSIS — Z0000 Encounter for general adult medical examination without abnormal findings: Secondary | ICD-10-CM | POA: Diagnosis not present

## 2017-03-27 DIAGNOSIS — Z125 Encounter for screening for malignant neoplasm of prostate: Secondary | ICD-10-CM | POA: Diagnosis not present

## 2017-03-31 DIAGNOSIS — Z0000 Encounter for general adult medical examination without abnormal findings: Secondary | ICD-10-CM | POA: Diagnosis not present

## 2017-03-31 DIAGNOSIS — D369 Benign neoplasm, unspecified site: Secondary | ICD-10-CM | POA: Diagnosis not present

## 2017-03-31 DIAGNOSIS — J45902 Unspecified asthma with status asthmaticus: Secondary | ICD-10-CM | POA: Diagnosis not present

## 2017-03-31 DIAGNOSIS — E119 Type 2 diabetes mellitus without complications: Secondary | ICD-10-CM | POA: Diagnosis not present

## 2017-05-24 DIAGNOSIS — C61 Malignant neoplasm of prostate: Secondary | ICD-10-CM | POA: Diagnosis not present

## 2017-05-24 DIAGNOSIS — E291 Testicular hypofunction: Secondary | ICD-10-CM | POA: Diagnosis not present

## 2017-05-26 DIAGNOSIS — E291 Testicular hypofunction: Secondary | ICD-10-CM | POA: Diagnosis not present

## 2017-05-26 DIAGNOSIS — C61 Malignant neoplasm of prostate: Secondary | ICD-10-CM | POA: Diagnosis not present

## 2017-05-26 DIAGNOSIS — N320 Bladder-neck obstruction: Secondary | ICD-10-CM | POA: Diagnosis not present

## 2017-05-29 DIAGNOSIS — D369 Benign neoplasm, unspecified site: Secondary | ICD-10-CM | POA: Diagnosis not present

## 2017-05-29 DIAGNOSIS — K219 Gastro-esophageal reflux disease without esophagitis: Secondary | ICD-10-CM | POA: Diagnosis not present

## 2017-05-29 DIAGNOSIS — Z86010 Personal history of colonic polyps: Secondary | ICD-10-CM | POA: Diagnosis not present

## 2017-06-19 DIAGNOSIS — M79641 Pain in right hand: Secondary | ICD-10-CM | POA: Diagnosis not present

## 2017-06-19 DIAGNOSIS — M1812 Unilateral primary osteoarthritis of first carpometacarpal joint, left hand: Secondary | ICD-10-CM | POA: Diagnosis not present

## 2017-07-25 DIAGNOSIS — G8918 Other acute postprocedural pain: Secondary | ICD-10-CM | POA: Diagnosis not present

## 2017-07-25 DIAGNOSIS — M1811 Unilateral primary osteoarthritis of first carpometacarpal joint, right hand: Secondary | ICD-10-CM | POA: Diagnosis not present

## 2017-07-28 DIAGNOSIS — D2261 Melanocytic nevi of right upper limb, including shoulder: Secondary | ICD-10-CM | POA: Diagnosis not present

## 2017-07-28 DIAGNOSIS — D2262 Melanocytic nevi of left upper limb, including shoulder: Secondary | ICD-10-CM | POA: Diagnosis not present

## 2017-07-28 DIAGNOSIS — D225 Melanocytic nevi of trunk: Secondary | ICD-10-CM | POA: Diagnosis not present

## 2017-07-28 DIAGNOSIS — L821 Other seborrheic keratosis: Secondary | ICD-10-CM | POA: Diagnosis not present

## 2017-08-08 ENCOUNTER — Encounter: Payer: Self-pay | Admitting: *Deleted

## 2017-08-08 DIAGNOSIS — M1811 Unilateral primary osteoarthritis of first carpometacarpal joint, right hand: Secondary | ICD-10-CM | POA: Diagnosis not present

## 2017-08-09 ENCOUNTER — Ambulatory Visit: Payer: PPO | Admitting: Anesthesiology

## 2017-08-09 ENCOUNTER — Encounter
Admission: RE | Disposition: A | Discharge status: Home / Self Care | Payer: Self-pay | Source: Ambulatory Visit | Attending: Unknown Physician Specialty

## 2017-08-09 ENCOUNTER — Ambulatory Visit
Admission: RE | Admit: 2017-08-09 | Discharge: 2017-08-09 | Disposition: A | Discharge status: Home / Self Care | Payer: PPO | Source: Ambulatory Visit | Attending: Unknown Physician Specialty | Admitting: Unknown Physician Specialty

## 2017-08-09 ENCOUNTER — Encounter: Payer: Self-pay | Admitting: *Deleted

## 2017-08-09 DIAGNOSIS — B9681 Helicobacter pylori [H. pylori] as the cause of diseases classified elsewhere: Secondary | ICD-10-CM | POA: Diagnosis not present

## 2017-08-09 DIAGNOSIS — Z79899 Other long term (current) drug therapy: Secondary | ICD-10-CM | POA: Diagnosis not present

## 2017-08-09 DIAGNOSIS — K640 First degree hemorrhoids: Secondary | ICD-10-CM | POA: Diagnosis not present

## 2017-08-09 DIAGNOSIS — K219 Gastro-esophageal reflux disease without esophagitis: Secondary | ICD-10-CM | POA: Insufficient documentation

## 2017-08-09 DIAGNOSIS — Z7982 Long term (current) use of aspirin: Secondary | ICD-10-CM | POA: Diagnosis not present

## 2017-08-09 DIAGNOSIS — Z86010 Personal history of colonic polyps: Secondary | ICD-10-CM | POA: Insufficient documentation

## 2017-08-09 DIAGNOSIS — K2950 Unspecified chronic gastritis without bleeding: Secondary | ICD-10-CM | POA: Insufficient documentation

## 2017-08-09 DIAGNOSIS — E785 Hyperlipidemia, unspecified: Secondary | ICD-10-CM | POA: Insufficient documentation

## 2017-08-09 DIAGNOSIS — Z8546 Personal history of malignant neoplasm of prostate: Secondary | ICD-10-CM | POA: Diagnosis not present

## 2017-08-09 DIAGNOSIS — Z1211 Encounter for screening for malignant neoplasm of colon: Secondary | ICD-10-CM | POA: Insufficient documentation

## 2017-08-09 DIAGNOSIS — Z888 Allergy status to other drugs, medicaments and biological substances status: Secondary | ICD-10-CM | POA: Insufficient documentation

## 2017-08-09 DIAGNOSIS — K3189 Other diseases of stomach and duodenum: Secondary | ICD-10-CM | POA: Diagnosis not present

## 2017-08-09 DIAGNOSIS — K648 Other hemorrhoids: Secondary | ICD-10-CM | POA: Diagnosis not present

## 2017-08-09 DIAGNOSIS — K5790 Diverticulosis of intestine, part unspecified, without perforation or abscess without bleeding: Secondary | ICD-10-CM | POA: Diagnosis not present

## 2017-08-09 DIAGNOSIS — K2900 Acute gastritis without bleeding: Secondary | ICD-10-CM | POA: Diagnosis not present

## 2017-08-09 DIAGNOSIS — I10 Essential (primary) hypertension: Secondary | ICD-10-CM | POA: Diagnosis not present

## 2017-08-09 DIAGNOSIS — K2960 Other gastritis without bleeding: Secondary | ICD-10-CM | POA: Diagnosis not present

## 2017-08-09 DIAGNOSIS — D120 Benign neoplasm of cecum: Secondary | ICD-10-CM | POA: Diagnosis not present

## 2017-08-09 DIAGNOSIS — Z87442 Personal history of urinary calculi: Secondary | ICD-10-CM | POA: Insufficient documentation

## 2017-08-09 DIAGNOSIS — M1990 Unspecified osteoarthritis, unspecified site: Secondary | ICD-10-CM | POA: Diagnosis not present

## 2017-08-09 DIAGNOSIS — N529 Male erectile dysfunction, unspecified: Secondary | ICD-10-CM | POA: Diagnosis not present

## 2017-08-09 DIAGNOSIS — Q438 Other specified congenital malformations of intestine: Secondary | ICD-10-CM | POA: Insufficient documentation

## 2017-08-09 DIAGNOSIS — K2970 Gastritis, unspecified, without bleeding: Secondary | ICD-10-CM | POA: Diagnosis not present

## 2017-08-09 DIAGNOSIS — K635 Polyp of colon: Secondary | ICD-10-CM | POA: Diagnosis not present

## 2017-08-09 DIAGNOSIS — K5730 Diverticulosis of large intestine without perforation or abscess without bleeding: Secondary | ICD-10-CM | POA: Insufficient documentation

## 2017-08-09 HISTORY — PX: COLONOSCOPY WITH PROPOFOL: SHX5780

## 2017-08-09 HISTORY — DX: Calculus of kidney: N20.0

## 2017-08-09 HISTORY — PX: ESOPHAGOGASTRODUODENOSCOPY (EGD) WITH PROPOFOL: SHX5813

## 2017-08-09 SURGERY — ESOPHAGOGASTRODUODENOSCOPY (EGD) WITH PROPOFOL
Anesthesia: General

## 2017-08-09 MED ORDER — LIDOCAINE HCL (PF) 2 % IJ SOLN
INTRAMUSCULAR | Status: AC
  Filled 2017-08-09: qty 10

## 2017-08-09 MED ORDER — PROPOFOL 500 MG/50ML IV EMUL
INTRAVENOUS | Status: AC
  Filled 2017-08-09: qty 50

## 2017-08-09 MED ORDER — LIDOCAINE HCL (CARDIAC) 20 MG/ML IV SOLN
INTRAVENOUS | Status: DC | PRN
  Administered 2017-08-09: 50 mg via INTRAVENOUS

## 2017-08-09 MED ORDER — FENTANYL CITRATE (PF) 100 MCG/2ML IJ SOLN
INTRAMUSCULAR | Status: DC | PRN
  Administered 2017-08-09: 50 ug via INTRAVENOUS

## 2017-08-09 MED ORDER — SODIUM CHLORIDE 0.9 % IV SOLN: INTRAVENOUS | Status: DC

## 2017-08-09 MED ORDER — MIDAZOLAM HCL 2 MG/2ML IJ SOLN
INTRAMUSCULAR | Status: DC | PRN
  Administered 2017-08-09: 2 mg via INTRAVENOUS

## 2017-08-09 MED ORDER — SODIUM CHLORIDE 0.9 % IV SOLN
INTRAVENOUS | Status: DC
  Administered 2017-08-09: 12:00:00 via INTRAVENOUS

## 2017-08-09 MED ORDER — PROPOFOL 500 MG/50ML IV EMUL
INTRAVENOUS | Status: DC | PRN
  Administered 2017-08-09: 120 ug/kg/min via INTRAVENOUS

## 2017-08-09 MED ORDER — FENTANYL CITRATE (PF) 100 MCG/2ML IJ SOLN
INTRAMUSCULAR | Status: AC
  Filled 2017-08-09: qty 2

## 2017-08-09 MED ORDER — MIDAZOLAM HCL 2 MG/2ML IJ SOLN
INTRAMUSCULAR | Status: AC
  Filled 2017-08-09: qty 2

## 2017-08-09 NOTE — Anesthesia Preprocedure Evaluation (Signed)
Anesthesia Evaluation  Patient identified by MRN, date of birth, ID band Patient awake    Reviewed: Allergy & Precautions, NPO status , Patient's Chart, lab work & pertinent test results, reviewed documented beta blocker date and time   Airway Mallampati: II  TM Distance: >3 FB     Dental  (+) Chipped   Pulmonary           Cardiovascular hypertension, Pt. on medications      Neuro/Psych    GI/Hepatic GERD  ,  Endo/Other    Renal/GU Renal disease     Musculoskeletal  (+) Arthritis ,   Abdominal   Peds  Hematology   Anesthesia Other Findings   Reproductive/Obstetrics                             Anesthesia Physical Anesthesia Plan  ASA: III  Anesthesia Plan: General   Post-op Pain Management:    Induction: Intravenous  PONV Risk Score and Plan:   Airway Management Planned:   Additional Equipment:   Intra-op Plan:   Post-operative Plan:   Informed Consent: I have reviewed the patients History and Physical, chart, labs and discussed the procedure including the risks, benefits and alternatives for the proposed anesthesia with the patient or authorized representative who has indicated his/her understanding and acceptance.     Plan Discussed with: CRNA  Anesthesia Plan Comments:         Anesthesia Quick Evaluation

## 2017-08-09 NOTE — Anesthesia Post-op Follow-up Note (Signed)
Anesthesia QCDR form completed.        

## 2017-08-09 NOTE — Anesthesia Postprocedure Evaluation (Signed)
Anesthesia Post Note  Patient: JESSTIN STUDSTILL  Procedure(s) Performed: ESOPHAGOGASTRODUODENOSCOPY (EGD) WITH PROPOFOL (N/A ) COLONOSCOPY WITH PROPOFOL (N/A )  Patient location during evaluation: Endoscopy Anesthesia Type: General Level of consciousness: awake and alert Pain management: pain level controlled Vital Signs Assessment: post-procedure vital signs reviewed and stable Respiratory status: spontaneous breathing, nonlabored ventilation, respiratory function stable and patient connected to nasal cannula oxygen Cardiovascular status: blood pressure returned to baseline and stable Postop Assessment: no apparent nausea or vomiting Anesthetic complications: no     Last Vitals:  Vitals:   08/09/17 1314 08/09/17 1324  BP: 127/88 135/89  Pulse: 63 (!) 58  Resp: 14 15  Temp:    SpO2: 98% 98%    Last Pain:  Vitals:   08/09/17 1244  TempSrc: Tympanic                 Nguyet Mercer S

## 2017-08-09 NOTE — Op Note (Signed)
[facility] Gastroenterology Patient Name: Phillip [name] Procedure Date: 08/09/2017 11:59 AM MRN: [id number] Account #: [id number] Date of Birth: 02/16/1946 Admit Type: Outpatient Age: 71 Room: [facility] ENDO ROOM 3 Gender: Male Note Status: Finalized Procedure:            Upper GI endoscopy Indications:          Heartburn Providers:            Manya Silvas, MD Referring MD:         Rusty Aus, MD (Referring MD) Medicines:            Propofol per Anesthesia Complications:        No immediate complications. Procedure:            Pre-Anesthesia Assessment:                       - After reviewing the risks and benefits, the patient                        was deemed in satisfactory condition to undergo the                        procedure.                       After obtaining informed consent, the endoscope was                        passed under direct vision. Throughout the procedure,                        the patient's blood pressure, pulse, and oxygen                        saturations were monitored continuously. The Endoscope                        was introduced through the mouth, and advanced to the                        second part of duodenum. The upper GI endoscopy was                        accomplished without difficulty. The patient tolerated                        the procedure well. Findings:      The examined esophagus was normal. GEJ 41cm.      Diffuse mild inflammation characterized by erythema and granularity was       found in the gastric body and in the gastric antrum. Biopsies were taken       with a cold forceps for histology.      The examined duodenum was normal.      Localized prominent gastric fold was found in the gastric antrum.       Biopsies were taken with a cold forceps for histology. Impression:           - Normal esophagus.                       - Gastritis. Biopsied.                       -  Normal examined  duodenum. Recommendation:       - Await pathology results. Manya Silvas, MD 08/09/2017 12:17:25 PM This report has been signed electronically. Number of Addenda: 0 Note Initiated On: 08/09/2017 11:59 AM      [facility]

## 2017-08-09 NOTE — Op Note (Signed)
[facility] Gastroenterology Patient Name: Phillip [name] Procedure Date: 08/09/2017 11:58 AM MRN: [id number] Account #: [id number] Date of Birth: [birth year] Admit Type: Outpatient Age: 71 Room: [facility] ENDO ROOM 3 Gender: Male Note Status: Finalized Procedure:            Colonoscopy Indications:          High risk colon cancer surveillance: Personal history                        of colonic polyps Providers:            Manya Silvas, MD Referring MD:         Rusty Aus, MD (Referring MD) Medicines:            Propofol per Anesthesia Complications:        No immediate complications. Procedure:            Pre-Anesthesia Assessment:                       - After reviewing the risks and benefits, the patient                        was deemed in satisfactory condition to undergo the                        procedure.                       After obtaining informed consent, the colonoscope was                        passed under direct vision. Throughout the procedure,                        the patient's blood pressure, pulse, and oxygen                        saturations were monitored continuously. The                        Colonoscope was introduced through the anus and                        advanced to the the cecum, identified by appendiceal                        orifice and ileocecal valve. The colonoscopy was                        somewhat difficult due to a tortuous colon. Successful                        completion of the procedure was aided by changing the                        patient to a supine position. The patient tolerated the                        procedure well. Findings:      A diminutive polyp was found in the cecum. The polyp was sessile. The  polyp was removed with a jumbo cold forceps. Resection and retrieval       were complete.      A few medium-mouthed diverticula were found in the sigmoid colon.      Internal hemorrhoids  were found during endoscopy. The hemorrhoids were       small and Grade I (internal hemorrhoids that do not prolapse).      The exam was otherwise without abnormality. Impression:           - One diminutive polyp in the cecum, removed with a                        jumbo cold forceps. Resected and retrieved.                       - Diverticulosis in the sigmoid colon.                       - Internal hemorrhoids.                       - The examination was otherwise normal. Recommendation:       - Await pathology results. Manya Silvas, MD 08/09/2017 12:44:25 PM This report has been signed electronically. Number of Addenda: 0 Note Initiated On: 08/09/2017 11:58 AM Scope Withdrawal Time: 0 hours 7 minutes 56 seconds  Total Procedure Duration: 0 hours 20 minutes 20 seconds       [facility]

## 2017-08-09 NOTE — Anesthesia Procedure Notes (Signed)
Performed by: Cook-Martin, Sinclair Arrazola Pre-anesthesia Checklist: Patient identified, Emergency Drugs available, Suction available, Patient being monitored and Timeout performed Patient Re-evaluated:Patient Re-evaluated prior to induction Oxygen Delivery Method: Nasal cannula Preoxygenation: Pre-oxygenation with 100% oxygen Induction Type: IV induction Airway Equipment and Method: Bite block Placement Confirmation: positive ETCO2 and CO2 detector       

## 2017-08-09 NOTE — Transfer of Care (Signed)
Immediate Anesthesia Transfer of Care Note  Patient: Phillip [name]  Procedure(s) Performed: ESOPHAGOGASTRODUODENOSCOPY (EGD) WITH PROPOFOL (N/A ) COLONOSCOPY WITH PROPOFOL (N/A )  Patient Location: PACU  Anesthesia Type:General  Level of Consciousness: awake and sedated  Airway & Oxygen Therapy: Patient Spontanous Breathing and Patient connected to nasal cannula oxygen  Post-op Assessment: Report given to RN and Post -op Vital signs reviewed and stable  Post vital signs: Reviewed and stable  Last Vitals:  Vitals:   08/09/17 1018  BP: 118/77  Pulse: 64  Resp: 20  Temp: (!) 36.2 C  SpO2: 98%    Last Pain:  Vitals:   08/09/17 1018  TempSrc: Tympanic         Complications: No apparent anesthesia complications

## 2017-08-09 NOTE — H&P (Signed)
Primary Care Physician:  Rusty Aus, MD Primary Gastroenterologist:  Dr. Vira Agar  Pre-Procedure History & Physical: HPI:  Phillip [name] is a 71 y.o. male is here for an endoscopy and colonoscopy.   Past Medical History:  Diagnosis Date  . Arthritis   . Bladder neck contracture   . Cataract   . Dyslipidemia   . ED (erectile dysfunction)   . Frequency   . GERD (gastroesophageal reflux disease)   . Hypertension   . Incontinence of urine    sui, s/p cryoablation  . Kidney stones   . Nocturia   . Prostate cancer ([place])    S/P   CRYOABLATION  . Right elbow tendinitis   . Wears glasses     Past Surgical History:  Procedure Laterality Date  . ARTHROPLASTY AND TENDON REPAIR LEFT THUMB  JAN 2014  . COLONOSCOPY    . GREEN LIGHT LASER TURP (TRANSURETHRAL RESECTION OF PROSTATE N/A 12/14/2015   Procedure: GREEN LIGHT LASER TURP (TRANSURETHRAL RESECTION OF PROSTATE) LASER OF BLADDER NECK CONTRACTURE;  Surgeon: Carolan Clines, MD;  Location: [place];  Service: Urology;  Laterality: N/A;  . PENILE PROSTHESIS IMPLANT N/A 12/06/2013   Procedure: PENILE PROTHESIS INFLATABLE;  Surgeon: Ailene Rud, MD;  Location: [facility];  Service: Urology;  Laterality: N/A;  . PROSTATE CRYOABLATION  06-01-2012  [facility]  . TRANSURETHRAL RESECTION OF BLADDER NECK N/A 03/20/2015   Procedure: RELEASE BLADDER NECK CONTRACTURE  WITH WOLF BUTTON ELECTRODE ;  Surgeon: Carolan Clines, MD;  Location: [place];  Service: Urology;  Laterality: N/A;  . TRIGGER FINGER RELEASE Left 10/2015  . ulner nerve neuropathy      Prior to Admission medications   Medication Sig Start Date End Date Taking? Authorizing Provider  amLODipine (NORVASC) 5 MG tablet Take 5 mg by mouth daily.   Yes [provider]  amLODipine-benazepril (LOTREL) 10-20 MG per capsule Take 1 capsule by mouth every morning.    Yes [provider]  aspirin EC 81 MG  tablet Take 81 mg by mouth daily.   Yes [provider]  benazepril (LOTENSIN) 20 MG tablet Take 20 mg by mouth daily.   Yes [provider]  cetirizine (ZYRTEC) 10 MG tablet Take 10 mg by mouth daily.   Yes [provider]  Multiple Vitamin (MULTIVITAMIN) tablet Take 1 tablet by mouth daily.   Yes [provider]  naproxen sodium (ANAPROX) 220 MG tablet Take 220 mg by mouth as needed.   Yes [provider]  niacin 500 MG tablet Take 500 mg by mouth at bedtime.   Yes [provider]  omeprazole (PRILOSEC) 40 MG capsule  08/27/16  Yes [provider]  vitamin C (ASCORBIC ACID) 500 MG tablet Take 500 mg by mouth daily.   Yes [provider]  etodolac (LODINE) 200 MG capsule Take 1 capsule (200 mg total) by mouth every 8 (eight) hours. 01/04/16   Loney Hering, MD  Glucosamine-Chondroit-Vit C-Mn (GLUCOSAMINE CHONDR 1500 COMPLX PO) Take 1 tablet by mouth daily.    [provider]  HYDROmorphone (DILAUDID) 4 MG tablet Take 1 tablet (4 mg total) by mouth every 4 (four) hours as needed for severe pain. 09/29/16   Hyatt, Max T, DPM  ondansetron (ZOFRAN) 4 MG tablet Take 1 tablet (4 mg total) by mouth every 8 (eight) hours as needed for nausea or vomiting. 09/29/16   Hyatt, Max T, DPM  oxyCODONE-acetaminophen (ROXICET) 5-325 MG  per tablet Take 2 tablets by mouth every 4 (four) hours as needed for severe pain. 03/20/15   Star Age, MD  potassium phosphate, monobasic, (K-PHOS ORIGINAL) 500 MG tablet Take 500 mg by mouth daily.    [provider]  testosterone cypionate (DEPOTESTOSTERONE CYPIONATE) 200 MG/ML injection  08/03/16   [provider]  traMADol-acetaminophen (ULTRACET) 37.5-325 MG tablet Take 1 tablet by mouth every 6 (six) hours as needed. 12/14/15   Carolan Clines, MD    Allergies as of 06/20/2017 - Review Complete 01/11/2017  Allergen Reaction Noted  . Doxazosin  03/09/2014    History  reviewed. No pertinent family history.  Social History   Socioeconomic History  . Marital status: Married    Spouse name: Not on file  . Number of children: Not on file  . Years of education: Not on file  . Highest education level: Not on file  Social Needs  . Financial resource strain: Not on file  . Food insecurity - worry: Not on file  . Food insecurity - inability: Not on file  . Transportation needs - medical: Not on file  . Transportation needs - non-medical: Not on file  Occupational History  . Not on file  Tobacco Use  . Smoking status: Never Smoker  . Smokeless tobacco: Never Used  Substance and Sexual Activity  . Alcohol use: No  . Drug use: No  . Sexual activity: Not on file  Other Topics Concern  . Not on file  Social History Narrative  . Not on file    Review of Systems: See HPI, otherwise negative ROS  Physical Exam: BP 118/77   Pulse 64   Temp (!) 97.2 F (36.2 C) (Tympanic)   Resp 20   Ht 5\' 9"  (1.753 m)   Wt 81.6 kg (180 lb)   SpO2 98%   BMI 26.58 kg/m  General:   Alert,  pleasant and cooperative in NAD Head:  Normocephalic and atraumatic. Neck:  Supple; no masses or thyromegaly. Lungs:  Clear throughout to auscultation.    Heart:  Regular rate and rhythm. Abdomen:  Soft, nontender and nondistended. Normal bowel sounds, without guarding, and without rebound.   Neurologic:  Alert and  oriented x4;  grossly normal neurologically.  Impression/Plan: Phillip [name] is here for an endoscopy and colonoscopy to be performed for [facility] colon polyps and heartburn.  Risks, benefits, limitations, and alternatives regarding  endoscopy and colonoscopy have been reviewed with the patient.  Questions have been answered.  All parties agreeable.   Gaylyn Cheers, MD  08/09/2017, 11:57 AM

## 2017-08-14 ENCOUNTER — Encounter: Payer: Self-pay | Admitting: Unknown Physician Specialty

## 2017-08-14 LAB — SURGICAL PATHOLOGY

## 2017-08-18 DIAGNOSIS — M7541 Impingement syndrome of right shoulder: Secondary | ICD-10-CM | POA: Diagnosis not present

## 2017-08-23 DIAGNOSIS — M1812 Unilateral primary osteoarthritis of first carpometacarpal joint, left hand: Secondary | ICD-10-CM | POA: Diagnosis not present

## 2017-08-24 ENCOUNTER — Ambulatory Visit
Admission: RE | Admit: 2017-08-24 | Discharge: 2017-08-24 | Disposition: A | Discharge status: Home / Self Care | Payer: PPO | Source: Ambulatory Visit | Attending: Family Medicine | Admitting: Family Medicine

## 2017-08-24 ENCOUNTER — Other Ambulatory Visit: Payer: Self-pay | Admitting: Family Medicine

## 2017-08-24 DIAGNOSIS — I82612 Acute embolism and thrombosis of superficial veins of left upper extremity: Secondary | ICD-10-CM | POA: Diagnosis not present

## 2017-08-24 DIAGNOSIS — M79622 Pain in left upper arm: Secondary | ICD-10-CM

## 2017-08-24 DIAGNOSIS — I808 Phlebitis and thrombophlebitis of other sites: Secondary | ICD-10-CM | POA: Diagnosis not present

## 2017-08-24 DIAGNOSIS — M25511 Pain in right shoulder: Secondary | ICD-10-CM | POA: Diagnosis not present

## 2017-09-04 DIAGNOSIS — K219 Gastro-esophageal reflux disease without esophagitis: Secondary | ICD-10-CM | POA: Diagnosis not present

## 2017-09-04 DIAGNOSIS — A048 Other specified bacterial intestinal infections: Secondary | ICD-10-CM | POA: Insufficient documentation

## 2017-09-06 DIAGNOSIS — M79644 Pain in right finger(s): Secondary | ICD-10-CM | POA: Diagnosis not present

## 2017-09-06 DIAGNOSIS — M1812 Unilateral primary osteoarthritis of first carpometacarpal joint, left hand: Secondary | ICD-10-CM | POA: Diagnosis not present

## 2017-09-14 DIAGNOSIS — M79644 Pain in right finger(s): Secondary | ICD-10-CM | POA: Diagnosis not present

## 2017-09-20 DIAGNOSIS — M25641 Stiffness of right hand, not elsewhere classified: Secondary | ICD-10-CM | POA: Diagnosis not present

## 2017-09-22 ENCOUNTER — Ambulatory Visit
Admission: RE | Admit: 2017-09-22 | Discharge: 2017-09-22 | Disposition: A | Discharge status: Home / Self Care | Payer: PPO | Source: Ambulatory Visit | Attending: Internal Medicine | Admitting: Internal Medicine

## 2017-09-22 ENCOUNTER — Other Ambulatory Visit: Payer: Self-pay | Admitting: Internal Medicine

## 2017-09-22 DIAGNOSIS — I82722 Chronic embolism and thrombosis of deep veins of left upper extremity: Secondary | ICD-10-CM

## 2017-09-22 DIAGNOSIS — I82612 Acute embolism and thrombosis of superficial veins of left upper extremity: Secondary | ICD-10-CM | POA: Diagnosis not present

## 2017-09-22 DIAGNOSIS — I82622 Acute embolism and thrombosis of deep veins of left upper extremity: Secondary | ICD-10-CM | POA: Insufficient documentation

## 2017-09-22 DIAGNOSIS — E119 Type 2 diabetes mellitus without complications: Secondary | ICD-10-CM | POA: Diagnosis not present

## 2017-09-29 DIAGNOSIS — E119 Type 2 diabetes mellitus without complications: Secondary | ICD-10-CM | POA: Diagnosis not present

## 2017-09-29 DIAGNOSIS — D369 Benign neoplasm, unspecified site: Secondary | ICD-10-CM | POA: Insufficient documentation

## 2017-09-29 DIAGNOSIS — Z125 Encounter for screening for malignant neoplasm of prostate: Secondary | ICD-10-CM | POA: Diagnosis not present

## 2017-09-29 DIAGNOSIS — Z0000 Encounter for general adult medical examination without abnormal findings: Secondary | ICD-10-CM | POA: Diagnosis not present

## 2017-09-29 DIAGNOSIS — E782 Mixed hyperlipidemia: Secondary | ICD-10-CM | POA: Diagnosis not present

## 2017-10-04 DIAGNOSIS — M1811 Unilateral primary osteoarthritis of first carpometacarpal joint, right hand: Secondary | ICD-10-CM | POA: Diagnosis not present

## 2017-10-04 DIAGNOSIS — M79641 Pain in right hand: Secondary | ICD-10-CM | POA: Diagnosis not present

## 2017-10-04 DIAGNOSIS — Z4889 Encounter for other specified surgical aftercare: Secondary | ICD-10-CM | POA: Diagnosis not present

## 2017-10-12 DIAGNOSIS — M25641 Stiffness of right hand, not elsewhere classified: Secondary | ICD-10-CM | POA: Diagnosis not present

## 2017-11-01 DIAGNOSIS — M1711 Unilateral primary osteoarthritis, right knee: Secondary | ICD-10-CM | POA: Diagnosis not present

## 2017-11-01 DIAGNOSIS — M1811 Unilateral primary osteoarthritis of first carpometacarpal joint, right hand: Secondary | ICD-10-CM | POA: Diagnosis not present

## 2017-11-01 DIAGNOSIS — M189 Osteoarthritis of first carpometacarpal joint, unspecified: Secondary | ICD-10-CM | POA: Diagnosis not present

## 2017-11-01 DIAGNOSIS — M170 Bilateral primary osteoarthritis of knee: Secondary | ICD-10-CM | POA: Diagnosis not present

## 2017-11-01 DIAGNOSIS — M1712 Unilateral primary osteoarthritis, left knee: Secondary | ICD-10-CM | POA: Diagnosis not present

## 2017-12-15 DIAGNOSIS — E291 Testicular hypofunction: Secondary | ICD-10-CM | POA: Diagnosis not present

## 2017-12-15 DIAGNOSIS — C61 Malignant neoplasm of prostate: Secondary | ICD-10-CM | POA: Diagnosis not present

## 2018-02-01 DIAGNOSIS — M79641 Pain in right hand: Secondary | ICD-10-CM | POA: Diagnosis not present

## 2018-02-07 DIAGNOSIS — Z8619 Personal history of other infectious and parasitic diseases: Secondary | ICD-10-CM | POA: Diagnosis not present

## 2018-02-07 DIAGNOSIS — K219 Gastro-esophageal reflux disease without esophagitis: Secondary | ICD-10-CM | POA: Diagnosis not present

## 2018-02-19 DIAGNOSIS — Z8619 Personal history of other infectious and parasitic diseases: Secondary | ICD-10-CM | POA: Diagnosis not present

## 2018-03-09 DIAGNOSIS — H8309 Labyrinthitis, unspecified ear: Secondary | ICD-10-CM | POA: Diagnosis not present

## 2018-03-09 DIAGNOSIS — H539 Unspecified visual disturbance: Secondary | ICD-10-CM | POA: Diagnosis not present

## 2018-03-28 DIAGNOSIS — E782 Mixed hyperlipidemia: Secondary | ICD-10-CM | POA: Diagnosis not present

## 2018-03-28 DIAGNOSIS — Z125 Encounter for screening for malignant neoplasm of prostate: Secondary | ICD-10-CM | POA: Diagnosis not present

## 2018-03-28 DIAGNOSIS — E119 Type 2 diabetes mellitus without complications: Secondary | ICD-10-CM | POA: Diagnosis not present

## 2018-04-04 DIAGNOSIS — E1169 Type 2 diabetes mellitus with other specified complication: Secondary | ICD-10-CM | POA: Diagnosis not present

## 2018-04-04 DIAGNOSIS — Z0000 Encounter for general adult medical examination without abnormal findings: Secondary | ICD-10-CM | POA: Diagnosis not present

## 2018-04-04 DIAGNOSIS — E782 Mixed hyperlipidemia: Secondary | ICD-10-CM | POA: Diagnosis not present

## 2018-06-06 DIAGNOSIS — M7582 Other shoulder lesions, left shoulder: Secondary | ICD-10-CM | POA: Diagnosis not present

## 2018-06-08 DIAGNOSIS — C61 Malignant neoplasm of prostate: Secondary | ICD-10-CM | POA: Diagnosis not present

## 2018-06-15 DIAGNOSIS — C61 Malignant neoplasm of prostate: Secondary | ICD-10-CM | POA: Diagnosis not present

## 2018-06-15 DIAGNOSIS — E291 Testicular hypofunction: Secondary | ICD-10-CM | POA: Diagnosis not present

## 2018-06-15 DIAGNOSIS — N3945 Continuous leakage: Secondary | ICD-10-CM | POA: Diagnosis not present

## 2018-07-04 DIAGNOSIS — M7582 Other shoulder lesions, left shoulder: Secondary | ICD-10-CM | POA: Diagnosis not present

## 2018-07-27 DIAGNOSIS — L821 Other seborrheic keratosis: Secondary | ICD-10-CM | POA: Diagnosis not present

## 2018-07-27 DIAGNOSIS — D485 Neoplasm of uncertain behavior of skin: Secondary | ICD-10-CM | POA: Diagnosis not present

## 2018-07-27 DIAGNOSIS — D2261 Melanocytic nevi of right upper limb, including shoulder: Secondary | ICD-10-CM | POA: Diagnosis not present

## 2018-07-27 DIAGNOSIS — X32XXXA Exposure to sunlight, initial encounter: Secondary | ICD-10-CM | POA: Diagnosis not present

## 2018-07-27 DIAGNOSIS — L570 Actinic keratosis: Secondary | ICD-10-CM | POA: Diagnosis not present

## 2018-07-27 DIAGNOSIS — D2272 Melanocytic nevi of left lower limb, including hip: Secondary | ICD-10-CM | POA: Diagnosis not present

## 2018-07-27 DIAGNOSIS — D2262 Melanocytic nevi of left upper limb, including shoulder: Secondary | ICD-10-CM | POA: Diagnosis not present

## 2018-07-27 DIAGNOSIS — D2271 Melanocytic nevi of right lower limb, including hip: Secondary | ICD-10-CM | POA: Diagnosis not present

## 2018-07-27 DIAGNOSIS — C44519 Basal cell carcinoma of skin of other part of trunk: Secondary | ICD-10-CM | POA: Diagnosis not present

## 2018-07-27 DIAGNOSIS — C44619 Basal cell carcinoma of skin of left upper limb, including shoulder: Secondary | ICD-10-CM | POA: Diagnosis not present

## 2018-08-02 DIAGNOSIS — C44619 Basal cell carcinoma of skin of left upper limb, including shoulder: Secondary | ICD-10-CM | POA: Diagnosis not present

## 2018-08-02 DIAGNOSIS — C4441 Basal cell carcinoma of skin of scalp and neck: Secondary | ICD-10-CM | POA: Diagnosis not present

## 2018-11-18 DIAGNOSIS — Z86711 Personal history of pulmonary embolism: Secondary | ICD-10-CM

## 2018-11-18 HISTORY — DX: Personal history of pulmonary embolism: Z86.711

## 2018-11-25 ENCOUNTER — Emergency Department
Admission: EM | Admit: 2018-11-25 | Discharge: 2018-11-26 | Disposition: A | Discharge status: Home / Self Care | Payer: Medicare Other | Attending: Emergency Medicine | Admitting: Emergency Medicine

## 2018-11-25 ENCOUNTER — Encounter: Payer: Self-pay | Admitting: Emergency Medicine

## 2018-11-25 ENCOUNTER — Other Ambulatory Visit: Payer: Self-pay

## 2018-11-25 ENCOUNTER — Emergency Department: Payer: Medicare Other

## 2018-11-25 DIAGNOSIS — Z7982 Long term (current) use of aspirin: Secondary | ICD-10-CM | POA: Insufficient documentation

## 2018-11-25 DIAGNOSIS — I82409 Acute embolism and thrombosis of unspecified deep veins of unspecified lower extremity: Secondary | ICD-10-CM

## 2018-11-25 DIAGNOSIS — I10 Essential (primary) hypertension: Secondary | ICD-10-CM | POA: Diagnosis not present

## 2018-11-25 DIAGNOSIS — Z8551 Personal history of malignant neoplasm of bladder: Secondary | ICD-10-CM | POA: Diagnosis not present

## 2018-11-25 DIAGNOSIS — Z79899 Other long term (current) drug therapy: Secondary | ICD-10-CM | POA: Diagnosis not present

## 2018-11-25 DIAGNOSIS — Z8546 Personal history of malignant neoplasm of prostate: Secondary | ICD-10-CM | POA: Diagnosis not present

## 2018-11-25 DIAGNOSIS — I82411 Acute embolism and thrombosis of right femoral vein: Secondary | ICD-10-CM

## 2018-11-25 DIAGNOSIS — M79661 Pain in right lower leg: Secondary | ICD-10-CM | POA: Diagnosis present

## 2018-11-25 HISTORY — DX: Acute embolism and thrombosis of unspecified deep veins of unspecified lower extremity: I82.409

## 2018-11-25 HISTORY — DX: Malignant neoplasm of bladder, unspecified: C67.9

## 2018-11-25 NOTE — ED Notes (Signed)
Refer to triage note. Pt reports Hx of clots in L arm and in a leg (not sure which). Sx onset this morning and have gotten progressively worse over the course of the day. Upon assessment, bilateral pedal pulses intact but feet feel cold and cap refill >4 seconds. Pt A&Ox4 and NAD.

## 2018-11-25 NOTE — ED Triage Notes (Signed)
Pt c/o right lower leg/calf pain; pt says pain starts at ankle and radiates up the back of his leg to his calf; history of DVT and says this feels similar; denies injury; talking in complete coherent sentences;

## 2018-11-26 LAB — BASIC METABOLIC PANEL
ANION GAP: 4 — AB (ref 5–15)
BUN: 31 mg/dL — ABNORMAL HIGH (ref 8–23)
CO2: 25 mmol/L (ref 22–32)
Calcium: 8.3 mg/dL — ABNORMAL LOW (ref 8.9–10.3)
Chloride: 112 mmol/L — ABNORMAL HIGH (ref 98–111)
Creatinine, Ser: 1.25 mg/dL — ABNORMAL HIGH (ref 0.61–1.24)
GFR calc Af Amer: >60 mL/min (ref >60)
GFR calc non Af Amer: 57 mL/min — ABNORMAL LOW (ref >60)
GLUCOSE: 151 mg/dL — AB (ref 70–99)
Potassium: 4.9 mmol/L (ref 3.5–5.1)
Sodium: 141 mmol/L (ref 135–145)

## 2018-11-26 LAB — CBC WITH DIFFERENTIAL/PLATELET
Abs Immature Granulocytes: 0.08 10*3/uL — ABNORMAL HIGH (ref 0.00–0.07)
Basophils Absolute: 0.1 10*3/uL (ref 0.0–0.1)
Basophils Relative: 1 %
Eosinophils Absolute: 0.2 10*3/uL (ref 0.0–0.5)
Eosinophils Relative: 2 %
HCT: 49.4 % (ref 39.0–52.0)
Hemoglobin: 15.9 g/dL (ref 13.0–17.0)
Immature Granulocytes: 1 %
Lymphocytes Relative: 22 %
Lymphs Abs: 2.5 10*3/uL (ref 0.7–4.0)
MCH: 31.7 pg (ref 26.0–34.0)
MCHC: 32.2 g/dL (ref 30.0–36.0)
MCV: 98.4 fL (ref 80.0–100.0)
Monocytes Absolute: 0.8 10*3/uL (ref 0.1–1.0)
Monocytes Relative: 7 %
NEUTROS ABS: 7.5 10*3/uL (ref 1.7–7.7)
Neutrophils Relative %: 67 %
Platelets: 335 10*3/uL (ref 150–400)
RBC: 5.02 MIL/uL (ref 4.22–5.81)
RDW: 14.3 % (ref 11.5–15.5)
WBC: 11.1 10*3/uL — ABNORMAL HIGH (ref 4.0–10.5)
nRBC: 0 % (ref 0.0–0.2)

## 2018-11-26 LAB — TROPONIN I: Troponin I: <0.03 ng/mL (ref <0.03)

## 2018-11-26 MED ORDER — ACETAMINOPHEN 500 MG PO TABS
1000.0000 mg | ORAL_TABLET | Freq: Once | ORAL | Status: AC
  Administered 2018-11-26: 1000 mg via ORAL
  Filled 2018-11-26: qty 2

## 2018-11-26 MED ORDER — TRAMADOL HCL 50 MG PO TABS: 50.0000 mg | ORAL_TABLET | Freq: Four times a day (QID) | ORAL | 0 refills | Status: DC | PRN

## 2018-11-26 MED ORDER — APIXABAN 5 MG PO TABS
10.0000 mg | ORAL_TABLET | Freq: Once | ORAL | Status: AC
  Administered 2018-11-26: 10 mg via ORAL
  Filled 2018-11-26: qty 2

## 2018-11-26 MED ORDER — ELIQUIS 5 MG VTE STARTER PACK: ORAL_TABLET | ORAL | 0 refills | Status: DC

## 2018-11-26 NOTE — ED Provider Notes (Signed)
[facility] Emergency Department Provider Note  ____________________________________________  Time seen: Approximately 12:40 AM  I have reviewed the triage vital signs and the nursing notes.   HISTORY  Chief Complaint Leg Pain   HPI SHLOK RAZ is a 73 y.o. male with history of recently diagnosed bladder cancer last week who for evaluation of right lower extremity pain.  The pain started today, sharp and throbbing, located from the knee down in the right lower extremity, worse with weightbearing and ambulation, improved with elevation and rest.  Patient reports prior history of DVT however does not remember ever being on anticoagulation in the past.  He denies recent travel or immobilization, recent surgery, trauma, family history of coagulopathy.  He also denies chest pain, shortness of breath or hemoptysis.  Past Medical History:  Diagnosis Date  . Arthritis   . Bladder cancer ([place])   . Bladder neck contracture   . Cataract   . Dyslipidemia   . ED (erectile dysfunction)   . Frequency   . GERD (gastroesophageal reflux disease)   . Hypertension   . Incontinence of urine    sui, s/p cryoablation  . Kidney stones   . Nocturia   . Prostate cancer ([place])    S/P   CRYOABLATION  . Right elbow tendinitis   . Wears glasses     There are no active problems to display for this patient.   Past Surgical History:  Procedure Laterality Date  . ARTHROPLASTY AND TENDON REPAIR LEFT THUMB  JAN 2014  . COLONOSCOPY    . COLONOSCOPY WITH PROPOFOL N/A 08/09/2017   Procedure: COLONOSCOPY WITH PROPOFOL;  Surgeon: Manya Silvas, MD;  Location: [facility] ENDOSCOPY;  Service: Endoscopy;  Laterality: N/A;  . ESOPHAGOGASTRODUODENOSCOPY (EGD) WITH PROPOFOL N/A 08/09/2017   Procedure: ESOPHAGOGASTRODUODENOSCOPY (EGD) WITH PROPOFOL;  Surgeon: Manya Silvas, MD;  Location: [facility] ENDOSCOPY;  Service: Endoscopy;  Laterality: N/A;  . GREEN LIGHT LASER TURP (TRANSURETHRAL  RESECTION OF PROSTATE N/A 12/14/2015   Procedure: GREEN LIGHT LASER TURP (TRANSURETHRAL RESECTION OF PROSTATE) LASER OF BLADDER NECK CONTRACTURE;  Surgeon: Carolan Clines, MD;  Location: [place];  Service: Urology;  Laterality: N/A;  . PENILE PROSTHESIS IMPLANT N/A 12/06/2013   Procedure: PENILE PROTHESIS INFLATABLE;  Surgeon: Ailene Rud, MD;  Location: [facility];  Service: Urology;  Laterality: N/A;  . PROSTATE CRYOABLATION  06-01-2012  [facility]  . TRANSURETHRAL RESECTION OF BLADDER NECK N/A 03/20/2015   Procedure: RELEASE BLADDER NECK CONTRACTURE  WITH WOLF BUTTON ELECTRODE ;  Surgeon: Carolan Clines, MD;  Location: [place];  Service: Urology;  Laterality: N/A;  . TRIGGER FINGER RELEASE Left 10/2015  . ulner nerve neuropathy      Prior to Admission medications   Medication Sig Start Date End Date Taking? Authorizing Provider  amLODipine (NORVASC) 5 MG tablet Take 5 mg by mouth daily.   Yes [provider]  aspirin EC 81 MG tablet Take 81 mg by mouth daily.   Yes [provider]  benazepril (LOTENSIN) 20 MG tablet Take 20 mg by mouth daily.   Yes [provider]  cetirizine (ZYRTEC) 10 MG tablet Take 10 mg by mouth daily.   Yes [provider]  cholecalciferol (VITAMIN D3) 25 MCG (1000 UT) tablet Take 1,000 Units by mouth daily.   Yes [provider]  Glucosamine-Chondroit-Vit C-Mn (GLUCOSAMINE CHONDR 1500 COMPLX PO) Take 1 tablet by mouth daily.   Yes [provider]  Multiple  Vitamin (MULTIVITAMIN) tablet Take 1 tablet by mouth daily.   Yes [provider]  naproxen sodium (ANAPROX) 220 MG tablet Take 220 mg by mouth as needed.   Yes [provider]  niacin 500 MG tablet Take 500 mg by mouth at bedtime.   Yes [provider]  omeprazole (PRILOSEC) 40 MG capsule  08/27/16  Yes [provider]  potassium phosphate, monobasic, (K-PHOS  ORIGINAL) 500 MG tablet Take 500 mg by mouth daily.   Yes [provider]  testosterone cypionate (DEPOTESTOSTERONE CYPIONATE) 200 MG/ML injection  08/03/16  Yes [provider]  Eliquis DVT/PE Starter Pack (ELIQUIS STARTER PACK) 5 MG TABS Take as directed on package: start with two-5mg  tablets twice daily for 7 days. On day 8, switch to one-5mg  tablet twice daily. 11/26/18   Rudene Re, MD  traMADol (ULTRAM) 50 MG tablet Take 1 tablet (50 mg total) by mouth every 6 (six) hours as needed. 11/26/18 11/26/19  Rudene Re, MD    Allergies Doxazosin  History reviewed. No pertinent family history.  Social History Social History   Tobacco Use  . Smoking status: Never Smoker  . Smokeless tobacco: Never Used  Substance Use Topics  . Alcohol use: No  . Drug use: No    Review of Systems  Constitutional: Negative for fever. Eyes: Negative for visual changes. ENT: Negative for sore throat. Neck: No neck pain  Cardiovascular: Negative for chest pain. Respiratory: Negative for shortness of breath. Gastrointestinal: Negative for abdominal pain, vomiting or diarrhea. Genitourinary: Negative for dysuria. Musculoskeletal: Negative for back pain. + RLE pain Skin: Negative for rash. Neurological: Negative for headaches, weakness or numbness. Psych: No SI or HI  ____________________________________________   PHYSICAL EXAM:  VITAL SIGNS: ED Triage Vitals  Enc Vitals Group     BP 11/25/18 2216 127/87     Pulse Rate 11/25/18 2216 63     Resp 11/25/18 2216 16     Temp 11/25/18 2216 98.4 F (36.9 C)     Temp Source 11/25/18 2216 Oral     SpO2 11/25/18 2216 97 %     Weight 11/25/18 2218 170 lb (77.1 kg)     Height 11/25/18 2218 5\' 9"  (1.753 m)     Head Circumference --      Peak Flow --      Pain Score 11/25/18 2218 7     Pain Loc --      Pain Edu? --      Excl. in [place]? --     Constitutional: Alert and oriented. Well appearing and in no apparent  distress. HEENT:      Head: Normocephalic and atraumatic.         Eyes: Conjunctivae are normal. Sclera is non-icteric.       Mouth/Throat: Mucous membranes are moist.       Neck: Supple with no signs of meningismus. Cardiovascular: Regular rate and rhythm. No murmurs, gallops, or rubs. 2+ symmetrical distal pulses are present in all extremities. No JVD. Respiratory: Normal respiratory effort. Lungs are clear to auscultation bilaterally. No wheezes, crackles, or rhonchi.  Gastrointestinal: Soft, non tender, and non distended with positive bowel sounds. No rebound or guarding. Musculoskeletal: Nontender with normal range of motion in all extremities. No edema, cyanosis, or erythema of extremities. Neurologic: Normal speech and language. Face is symmetric. Moving all extremities. No gross focal neurologic deficits are appreciated. Skin: Skin is warm, dry and intact. No rash noted. Psychiatric: Mood and affect are normal. Speech and  behavior are normal.  ____________________________________________   LABS (all labs ordered are listed, but only abnormal results are displayed)  Labs Reviewed  CBC WITH DIFFERENTIAL/PLATELET - Abnormal; Notable for the following components:      Result Value   WBC 11.1 (*)    Abs Immature Granulocytes 0.08 (*)    All other components within normal limits  BASIC METABOLIC PANEL - Abnormal; Notable for the following components:   Chloride 112 (*)    Glucose, Bld 151 (*)    BUN 31 (*)    Creatinine, Ser 1.25 (*)    Calcium 8.3 (*)    GFR calc non Af Amer 57 (*)    Anion gap 4 (*)    All other components within normal limits  TROPONIN I   ____________________________________________  EKG  ED ECG REPORT I, Rudene Re, the attending physician, personally viewed and interpreted this ECG.  Normal sinus rhythm, rate of 79, normal intervals, normal axis, no ST elevations or depressions, Q waves in inferior and anterior leads which are new when  compared to prior from 2016 ____________________________________________  RADIOLOGY  I have personally reviewed the images performed during this visit and I agree with the Radiologist's read.   Interpretation by Radiologist:  US Venous Img Lower Unilateral Right  Result Date: 11/25/2018 CLINICAL DATA:  Pain and tenderness. History of greater saphenous vein thrombus and 2017. EXAM: Right LOWER EXTREMITY VENOUS DOPPLER ULTRASOUND TECHNIQUE: Gray-scale sonography with graded compression, as well as color Doppler and duplex ultrasound were performed to evaluate the lower extremity deep venous systems from the level of the common femoral vein and including the common femoral, femoral, profunda femoral, popliteal and calf veins including the posterior tibial, peroneal and gastrocnemius veins when visible. The superficial great saphenous vein was also interrogated. Spectral Doppler was utilized to evaluate flow at rest and with distal augmentation maneuvers in the common femoral, femoral and popliteal veins. COMPARISON:  01/03/2016 FINDINGS: Contralateral Common Femoral Vein: Respiratory phasicity is normal and symmetric with the symptomatic side. No evidence of thrombus. Normal compressibility. Common Femoral Vein: No evidence of thrombus. Normal compressibility, respiratory phasicity and response to augmentation. Saphenofemoral Junction: No evidence of thrombus. Normal compressibility and flow on color Doppler imaging. Profunda Femoral Vein: No evidence of thrombus. Normal compressibility and flow on color Doppler imaging. Femoral Vein: No evidence of thrombus. Normal compressibility, respiratory phasicity and response to augmentation. Popliteal Vein: Eccentric nonocclusive thrombus is demonstrated within the right popliteal vein. Flow is shown around the area of thrombus. Calf Veins: No evidence of thrombus. Normal compressibility and flow on color Doppler imaging. Superficial Great Saphenous Vein: No evidence  of thrombus. Normal compressibility. Venous Reflux:  None. Other Findings:  None. IMPRESSION: Focal nonocclusive thrombus demonstrated in the right popliteal vein. These results were called by telephone at the time of interpretation on 11/25/2018 at 11:51 pm to Dr. Joni Fears , who verbally acknowledged these results. Electronically Signed   By: Lucienne Capers M.D.   On: 11/25/2018 23:55     ____________________________________________   PROCEDURES  Procedure(s) performed: None Procedures Critical Care performed:  None ____________________________________________   INITIAL IMPRESSION / ASSESSMENT AND PLAN / ED COURSE  73 y.o. male with history of recently diagnosed bladder cancer last week who presents for evaluation of right lower extremity pain x 2 day.  Prior history of DVT.  Not on anticoagulation.  Doppler ultrasound positive for focal nonocclusive thrombus in the right popliteal vein.  Patient has no chest pain, no shortness of breath,  no hypoxia, no tachypnea or tachycardia therefore low suspicion for PE.  Will start patient on Eliquis. Labs and EKG with no acute findings.  Discussed and return precautions and close follow-up      As part of my medical decision making, I reviewed the following data within the [place] notes reviewed and incorporated, Labs reviewed , EKG interpreted , Old EKG reviewed, Old chart reviewed, Radiograph reviewed , Notes from prior ED visits and [place] Controlled Substance Database    Pertinent labs & imaging results that were available during my care of the patient were reviewed by me and considered in my medical decision making (see chart for details).    ____________________________________________   FINAL CLINICAL IMPRESSION(S) / ED DIAGNOSES  Final diagnoses:  Acute deep vein thrombosis (DVT) of femoral vein of right lower extremity (HCC)      NEW MEDICATIONS STARTED DURING THIS VISIT:  ED Discharge Orders          Ordered    Eliquis DVT/PE Starter Pack (ELIQUIS STARTER PACK) 5 MG TABS     11/26/18 0328    traMADol (ULTRAM) 50 MG tablet  Every 6 hours PRN     11/26/18 0328           Note:  This document was prepared using Dragon voice recognition software and may include unintentional dictation errors.    Alfred Levins, [place], MD 11/26/18 [phone]

## 2018-11-26 NOTE — ED Notes (Signed)
Reviewed discharge instructions, follow-up care, and prescriptions with patient. Patient verbalized understanding of all information reviewed. Patient stable, with no distress noted at this time.    

## 2018-11-28 ENCOUNTER — Other Ambulatory Visit: Payer: Self-pay | Admitting: Urology

## 2018-11-29 ENCOUNTER — Encounter (HOSPITAL_BASED_OUTPATIENT_CLINIC_OR_DEPARTMENT_OTHER): Payer: Self-pay | Admitting: *Deleted

## 2018-11-29 ENCOUNTER — Other Ambulatory Visit: Payer: Self-pay

## 2018-11-29 NOTE — Progress Notes (Addendum)
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

## 2018-12-05 ENCOUNTER — Encounter (HOSPITAL_BASED_OUTPATIENT_CLINIC_OR_DEPARTMENT_OTHER): Payer: Self-pay | Admitting: Anesthesiology

## 2018-12-05 ENCOUNTER — Ambulatory Visit (HOSPITAL_BASED_OUTPATIENT_CLINIC_OR_DEPARTMENT_OTHER)
Admission: RE | Admit: 2018-12-05 | Discharge: 2018-12-05 | Disposition: A | Discharge status: Home / Self Care | Payer: Medicare Other | Attending: Urology | Admitting: Urology

## 2018-12-05 ENCOUNTER — Other Ambulatory Visit: Payer: Self-pay

## 2018-12-05 ENCOUNTER — Ambulatory Visit (HOSPITAL_BASED_OUTPATIENT_CLINIC_OR_DEPARTMENT_OTHER): Payer: Medicare Other | Admitting: Anesthesiology

## 2018-12-05 ENCOUNTER — Encounter (HOSPITAL_BASED_OUTPATIENT_CLINIC_OR_DEPARTMENT_OTHER)
Admission: RE | Disposition: A | Discharge status: Home / Self Care | Payer: Self-pay | Source: Home / Self Care | Attending: Urology

## 2018-12-05 DIAGNOSIS — K219 Gastro-esophageal reflux disease without esophagitis: Secondary | ICD-10-CM | POA: Insufficient documentation

## 2018-12-05 DIAGNOSIS — C672 Malignant neoplasm of lateral wall of bladder: Secondary | ICD-10-CM | POA: Insufficient documentation

## 2018-12-05 DIAGNOSIS — Z86718 Personal history of other venous thrombosis and embolism: Secondary | ICD-10-CM | POA: Insufficient documentation

## 2018-12-05 DIAGNOSIS — I10 Essential (primary) hypertension: Secondary | ICD-10-CM | POA: Insufficient documentation

## 2018-12-05 DIAGNOSIS — Z7901 Long term (current) use of anticoagulants: Secondary | ICD-10-CM | POA: Diagnosis not present

## 2018-12-05 DIAGNOSIS — Z7982 Long term (current) use of aspirin: Secondary | ICD-10-CM | POA: Insufficient documentation

## 2018-12-05 DIAGNOSIS — D494 Neoplasm of unspecified behavior of bladder: Secondary | ICD-10-CM | POA: Diagnosis present

## 2018-12-05 DIAGNOSIS — Z791 Long term (current) use of non-steroidal anti-inflammatories (NSAID): Secondary | ICD-10-CM | POA: Diagnosis not present

## 2018-12-05 DIAGNOSIS — Z8546 Personal history of malignant neoplasm of prostate: Secondary | ICD-10-CM | POA: Diagnosis not present

## 2018-12-05 DIAGNOSIS — Z79899 Other long term (current) drug therapy: Secondary | ICD-10-CM | POA: Insufficient documentation

## 2018-12-05 HISTORY — DX: Acute embolism and thrombosis of unspecified deep veins of unspecified lower extremity: I82.409

## 2018-12-05 HISTORY — PX: TRANSURETHRAL RESECTION OF BLADDER TUMOR: SHX2575

## 2018-12-05 LAB — PROTIME-INR
INR: 1 (ref 0.8–1.2)
Prothrombin Time: 13.1 seconds (ref 11.4–15.2)

## 2018-12-05 SURGERY — TURBT (TRANSURETHRAL RESECTION OF BLADDER TUMOR)
Anesthesia: General | Site: Bladder

## 2018-12-05 MED ORDER — MEPERIDINE HCL 25 MG/ML IJ SOLN
6.2500 mg | INTRAMUSCULAR | Status: DC | PRN
  Filled 2018-12-05: qty 1

## 2018-12-05 MED ORDER — DEXAMETHASONE SODIUM PHOSPHATE 10 MG/ML IJ SOLN
INTRAMUSCULAR | Status: AC
  Filled 2018-12-05: qty 1

## 2018-12-05 MED ORDER — CEFAZOLIN SODIUM-DEXTROSE 2-4 GM/100ML-% IV SOLN
2.0000 g | Freq: Once | INTRAVENOUS | Status: AC
  Administered 2018-12-05: 2 g via INTRAVENOUS
  Filled 2018-12-05: qty 100

## 2018-12-05 MED ORDER — LIDOCAINE 2% (20 MG/ML) 5 ML SYRINGE
INTRAMUSCULAR | Status: AC
  Filled 2018-12-05: qty 5

## 2018-12-05 MED ORDER — FENTANYL CITRATE (PF) 100 MCG/2ML IJ SOLN
INTRAMUSCULAR | Status: AC
  Filled 2018-12-05: qty 2

## 2018-12-05 MED ORDER — CEPHALEXIN 500 MG PO CAPS: 500.0000 mg | ORAL_CAPSULE | Freq: Two times a day (BID) | ORAL | 0 refills | Status: DC

## 2018-12-05 MED ORDER — LACTATED RINGERS IV SOLN
INTRAVENOUS | Status: DC
  Administered 2018-12-05: 1000 mL via INTRAVENOUS
  Administered 2018-12-05: 10:00:00 via INTRAVENOUS
  Filled 2018-12-05: qty 1000

## 2018-12-05 MED ORDER — LIDOCAINE 2% (20 MG/ML) 5 ML SYRINGE
INTRAMUSCULAR | Status: DC | PRN
  Administered 2018-12-05: 60 mg via INTRAVENOUS

## 2018-12-05 MED ORDER — GEMCITABINE CHEMO FOR BLADDER INSTILLATION 2000 MG
2000.0000 mg | Freq: Once | INTRAVENOUS | Status: AC
  Administered 2018-12-05: 2000 mg via INTRAVESICAL
  Filled 2018-12-05: qty 2000

## 2018-12-05 MED ORDER — HYDROCODONE-ACETAMINOPHEN 7.5-325 MG PO TABS
ORAL_TABLET | ORAL | Status: AC
  Filled 2018-12-05: qty 1

## 2018-12-05 MED ORDER — LIDOCAINE 2% (20 MG/ML) 5 ML SYRINGE: INTRAMUSCULAR | Status: DC | PRN

## 2018-12-05 MED ORDER — DEXAMETHASONE SODIUM PHOSPHATE 4 MG/ML IJ SOLN
INTRAMUSCULAR | Status: DC | PRN
  Administered 2018-12-05: 10 mg via INTRAVENOUS

## 2018-12-05 MED ORDER — PHENAZOPYRIDINE HCL 200 MG PO TABS: 200.0000 mg | ORAL_TABLET | Freq: Three times a day (TID) | ORAL | 0 refills | Status: AC | PRN

## 2018-12-05 MED ORDER — ONDANSETRON HCL 4 MG/2ML IJ SOLN
4.0000 mg | Freq: Once | INTRAMUSCULAR | Status: AC | PRN
  Administered 2018-12-05: 4 mg via INTRAVENOUS
  Filled 2018-12-05: qty 2

## 2018-12-05 MED ORDER — PROPOFOL 10 MG/ML IV BOLUS
INTRAVENOUS | Status: AC
  Filled 2018-12-05: qty 20

## 2018-12-05 MED ORDER — PROPOFOL 10 MG/ML IV BOLUS
INTRAVENOUS | Status: DC | PRN
  Administered 2018-12-05: 150 mg via INTRAVENOUS
  Administered 2018-12-05: 50 mg via INTRAVENOUS

## 2018-12-05 MED ORDER — HYDROCODONE-ACETAMINOPHEN 7.5-325 MG PO TABS
1.0000 | ORAL_TABLET | Freq: Once | ORAL | Status: AC | PRN
  Administered 2018-12-05: 1 via ORAL
  Filled 2018-12-05: qty 1

## 2018-12-05 MED ORDER — TRAMADOL HCL 50 MG PO TABS: 50.0000 mg | ORAL_TABLET | Freq: Four times a day (QID) | ORAL | 0 refills | Status: DC | PRN

## 2018-12-05 MED ORDER — CEFAZOLIN SODIUM-DEXTROSE 2-4 GM/100ML-% IV SOLN
INTRAVENOUS | Status: AC
  Filled 2018-12-05: qty 100

## 2018-12-05 MED ORDER — ONDANSETRON HCL 4 MG/2ML IJ SOLN
INTRAMUSCULAR | Status: DC | PRN
  Administered 2018-12-05: 4 mg via INTRAVENOUS

## 2018-12-05 MED ORDER — FENTANYL CITRATE (PF) 100 MCG/2ML IJ SOLN
25.0000 ug | INTRAMUSCULAR | Status: DC | PRN
  Administered 2018-12-05 (×2): 50 ug via INTRAVENOUS
  Filled 2018-12-05: qty 1

## 2018-12-05 MED ORDER — SODIUM CHLORIDE 0.9 % IR SOLN
Status: DC | PRN
  Administered 2018-12-05: 6000 mL

## 2018-12-05 MED ORDER — FENTANYL CITRATE (PF) 100 MCG/2ML IJ SOLN
INTRAMUSCULAR | Status: DC | PRN
  Administered 2018-12-05: 25 ug via INTRAVENOUS
  Administered 2018-12-05: 50 ug via INTRAVENOUS
  Administered 2018-12-05 (×2): 12.5 ug via INTRAVENOUS

## 2018-12-05 MED ORDER — ONDANSETRON HCL 4 MG/2ML IJ SOLN
INTRAMUSCULAR | Status: AC
  Filled 2018-12-05: qty 2

## 2018-12-05 MED ORDER — OXYBUTYNIN CHLORIDE 5 MG PO TABS: 5.0000 mg | ORAL_TABLET | Freq: Three times a day (TID) | ORAL | 1 refills | Status: DC | PRN

## 2018-12-05 SURGICAL SUPPLY — 27 items
BAG DRAIN URO-CYSTO SKYTR STRL (DRAIN) ×2 IMPLANT
BAG URINE DRAINAGE (UROLOGICAL SUPPLIES) IMPLANT
BAG URINE LEG 500ML (DRAIN) IMPLANT
CATH FOLEY 2WAY SLVR  5CC 18FR (CATHETERS) ×1
CATH FOLEY 2WAY SLVR 5CC 18FR (CATHETERS) ×1 IMPLANT
GLOVE BIO SURGEON STRL SZ 6.5 (GLOVE) ×4 IMPLANT
GLOVE BIO SURGEON STRL SZ7.5 (GLOVE) ×2 IMPLANT
GLOVE BIOGEL PI IND STRL 6.5 (GLOVE) ×1 IMPLANT
GLOVE BIOGEL PI IND STRL 7.0 (GLOVE) ×1 IMPLANT
GLOVE BIOGEL PI INDICATOR 6.5 (GLOVE) ×1
GLOVE BIOGEL PI INDICATOR 7.0 (GLOVE) ×1
GOWN STRL REUS W/ TWL LRG LVL3 (GOWN DISPOSABLE) ×2 IMPLANT
GOWN STRL REUS W/TWL LRG LVL3 (GOWN DISPOSABLE) ×2
GOWN STRL REUS W/TWL XL LVL3 (GOWN DISPOSABLE) ×2 IMPLANT
GUIDEWIRE ZIPWRE .038 STRAIGHT (WIRE) ×2 IMPLANT
HOLDER FOLEY CATH W/STRAP (MISCELLANEOUS) ×2 IMPLANT
IV NS IRRIG 3000ML ARTHROMATIC (IV SOLUTION) ×4 IMPLANT
LOOP CUT BIPOLAR 24F LRG (ELECTROSURGICAL) ×2 IMPLANT
MANIFOLD NEPTUNE II (INSTRUMENTS) ×2 IMPLANT
NS IRRIG 500ML POUR BTL (IV SOLUTION) IMPLANT
PACK CYSTO (CUSTOM PROCEDURE TRAY) ×2 IMPLANT
PLUG CATH AND CAP STER (CATHETERS) ×2 IMPLANT
SYRINGE IRR TOOMEY STRL 70CC (SYRINGE) ×2 IMPLANT
TUBE CONNECTING 12X1/4 (SUCTIONS) ×2 IMPLANT
TUBING UROLOGY SET (TUBING) ×2 IMPLANT
WATER STERILE IRR 3000ML UROMA (IV SOLUTION) IMPLANT
WATER STERILE IRR 500ML POUR (IV SOLUTION) ×2 IMPLANT

## 2018-12-05 NOTE — Anesthesia Procedure Notes (Signed)
Procedure Name: LMA Insertion Date/Time: 12/05/2018 10:43 AM Performed by: Wanita Chamberlain, CRNA Pre-anesthesia Checklist: Patient identified, Emergency Drugs available, Suction available, Patient being monitored and Timeout performed Patient Re-evaluated:Patient Re-evaluated prior to induction Oxygen Delivery Method: Circle system utilized Preoxygenation: Pre-oxygenation with 100% oxygen Induction Type: IV induction Ventilation: Mask ventilation without difficulty LMA: LMA inserted LMA Size: 4.0 Number of attempts: 1 Airway Equipment and Method: Bite block Placement Confirmation: breath sounds checked- equal and bilateral,  CO2 detector and positive ETCO2 Tube secured with: Tape Dental Injury: Teeth and Oropharynx as per pre-operative assessment

## 2018-12-05 NOTE — H&P (Signed)
Urology Preoperative H&P   Chief Complaint: Bladder tumor  History of Present Illness: Phillip [name] is a 73 y.o. male witha history of prostate cancer treated with cryotherapy and 2013. He is s/p IPP placement in 2015. He had a subsequent bladder neck contracture that required a TURP/greenlight TUIP. He has been dealing with incontinence since then. He tried a Cunningham clamp in the past, but was too uncomfortable secondary to his IPP.   Last PSA- 0.044 (05/2018), 0.12 (05/2017) stable  Last testosterone-259.1, Hct- 45.8 (05/2018)-- currently on 1 cc of IMTC every other week   The patient was seen by Jiles Crocker, NP on 10/22/2018 after an episode of painless gross hematuria. He was empirically treated for suspected prostatitis, but his urine culture came back with no growth.   The patient recently underwent cystoscopy in the office and was found to have a papillary tumor adjacent to the right ureteral orifice.  Subsequent CT urogram revealed a 1.7 cm right posterior bladder mass, but no evidence of any upper tract lesions.  He was noted to have bilateral nonobstructing kidney stones.    Past Medical History:  Diagnosis Date  . Arthritis   . Bladder cancer ([place])   . Bladder neck contracture   . Cataract   . DVT (deep venous thrombosis) ([place]) 11/25/2018   right le  . Dyslipidemia   . ED (erectile dysfunction)   . Frequency   . GERD (gastroesophageal reflux disease)   . Hypertension   . Incontinence of urine    sui, s/p cryoablation  . Kidney stones   . Nocturia   . Prostate cancer ([place])    S/P   CRYOABLATION  . Right elbow tendinitis   . Wears glasses     Past Surgical History:  Procedure Laterality Date  . ARTHROPLASTY AND TENDON REPAIR LEFT THUMB  JAN 2014  . COLONOSCOPY    . COLONOSCOPY WITH PROPOFOL N/A 08/09/2017   Procedure: COLONOSCOPY WITH PROPOFOL;  Surgeon: Manya Silvas, MD;  Location: [facility] ENDOSCOPY;  Service: Endoscopy;  Laterality: N/A;  .  ESOPHAGOGASTRODUODENOSCOPY (EGD) WITH PROPOFOL N/A 08/09/2017   Procedure: ESOPHAGOGASTRODUODENOSCOPY (EGD) WITH PROPOFOL;  Surgeon: Manya Silvas, MD;  Location: [facility] ENDOSCOPY;  Service: Endoscopy;  Laterality: N/A;  . GREEN LIGHT LASER TURP (TRANSURETHRAL RESECTION OF PROSTATE N/A 12/14/2015   Procedure: GREEN LIGHT LASER TURP (TRANSURETHRAL RESECTION OF PROSTATE) LASER OF BLADDER NECK CONTRACTURE;  Surgeon: Carolan Clines, MD;  Location: [place];  Service: Urology;  Laterality: N/A;  . PENILE PROSTHESIS IMPLANT N/A 12/06/2013   Procedure: PENILE PROTHESIS INFLATABLE;  Surgeon: Ailene Rud, MD;  Location: [facility];  Service: Urology;  Laterality: N/A;  . PROSTATE CRYOABLATION  06-01-2012  [facility]  . TRANSURETHRAL RESECTION OF BLADDER NECK N/A 03/20/2015   Procedure: RELEASE BLADDER NECK CONTRACTURE  WITH WOLF BUTTON ELECTRODE ;  Surgeon: Carolan Clines, MD;  Location: [place];  Service: Urology;  Laterality: N/A;  . TRIGGER FINGER RELEASE Left 10/2015  . ulner nerve neuropathy      Allergies:  Allergies  Allergen Reactions  . Doxazosin     Other reaction(s): Unknown    History reviewed. No pertinent family history.  Social History:  reports that he has never smoked. He has never used smokeless tobacco. He reports that he does not drink alcohol or use drugs.  ROS: A complete review of systems was performed.  All systems are negative except for pertinent findings as noted.  Physical Exam:  Vital signs in last 24 hours: Temp:  [97.4 F (36.3 C)] 97.4 F (36.3 C) (03/18 0909) Pulse Rate:  [58] 58 (03/18 0909) Resp:  [16] 16 (03/18 0909) BP: (147)/(80) 147/80 (03/18 0909) SpO2:  [100 %] 100 % (03/18 0909) Weight:  [77.8 kg] 77.8 kg (03/18 0909) Constitutional:  Alert and oriented, No acute distress Cardiovascular: Regular rate and rhythm, No JVD Respiratory: Normal respiratory effort, Lungs clear  bilaterally GI: Abdomen is soft, nontender, nondistended, no abdominal masses GU: No CVA tenderness Lymphatic: No lymphadenopathy Neurologic: Grossly intact, no focal deficits Psychiatric: Normal mood and affect  Laboratory Data:  No results for input(s): WBC, HGB, HCT, PLT in the last 72 hours.  No results for input(s): NA, K, CL, GLUCOSE, BUN, CALCIUM, CREATININE in the last 72 hours.  Invalid input(s): CO3   No results found for this or any previous visit (from the past 24 hour(s)). No results found for this or any previous visit (from the past 240 hour(s)).  Renal Function: No results for input(s): CREATININE in the last 168 hours. Estimated Creatinine Clearance: 53.4 mL/min (A) (by C-G formula based on SCr of 1.25 mg/dL (H)).  Radiologic Imaging: No results found.  I independently reviewed the above imaging studies.  Assessment and Plan Phillip [name] is a 73 y.o. 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 The risks include, but are not limited to, bleeding, urinary tract infection, bladder perforation requiring prolonged catheterization and/or open bladder repair, ureteral obstruction, voiding dysfunction and the inherent risks of general anesthesia. The patient voices understanding and wishes to proceed.    Ellison Hughs, MD 12/05/2018, 9:39 AM  [facility] Pager: [phone]

## 2018-12-05 NOTE — Anesthesia Preprocedure Evaluation (Addendum)
Anesthesia Evaluation  Patient identified by MRN, date of birth, ID band Patient awake    Reviewed: Allergy & Precautions, NPO status , Patient's Chart, lab work & pertinent test results, reviewed documented beta blocker date and time   Airway Mallampati: II  TM Distance: >3 FB Neck ROM: Full    Dental no notable dental hx. (+) Teeth Intact, Dental Advisory Given, Caps,    Pulmonary neg pulmonary ROS,    Pulmonary exam normal breath sounds clear to auscultation       Cardiovascular hypertension, Pt. on medications and Pt. on home beta blockers Normal cardiovascular exam Rhythm:Regular Rate:Normal  Hx/o DVT Right lower extremity 11/25/2018   Neuro/Psych negative neurological ROS  negative psych ROS   GI/Hepatic Neg liver ROS, GERD  Medicated and Controlled,  Endo/Other  Hyperlipidemia  Renal/GU Renal diseaseHx/o renal calculi Bladder dysfunction  Bladder Ca Prostate Ca SUI Bladder neck contracture ED    Musculoskeletal  (+) Arthritis , Osteoarthritis,    Abdominal   Peds  Hematology Anticoagulated Eliquis therapy- last dose 9 days ago Lovenox therapy- last dose 4 days ago   Anesthesia Other Findings   Reproductive/Obstetrics                         Anesthesia Physical Anesthesia Plan  ASA: III  Anesthesia Plan: General   Post-op Pain Management:    Induction: Intravenous  PONV Risk Score and Plan: 3 and Ondansetron, Dexamethasone and Treatment may vary due to age or medical condition  Airway Management Planned: LMA  Additional Equipment:   Intra-op Plan:   Post-operative Plan: Extubation in OR  Informed Consent: I have reviewed the patients History and Physical, chart, labs and discussed the procedure including the risks, benefits and alternatives for the proposed anesthesia with the patient or authorized representative who has indicated his/her understanding and acceptance.      Dental advisory given  Plan Discussed with: CRNA and Surgeon  Anesthesia Plan Comments:        Anesthesia Quick Evaluation

## 2018-12-05 NOTE — Op Note (Addendum)
Operative Note  Preoperative diagnosis:  1.  1.7 cm papillary bladder tumor adjacent to the right ureteral orifice  Postoperative diagnosis: 1.  Same  Procedure(s): 1.  TURBT small 2.  Intravesical instillation of gemcitabine  Surgeon: Ellison Hughs, MD  Assistants:  None  Anesthesia:  General  Complications:  None  EBL: 10 mL  Specimens: 1.  Superficial and deep right lateral bladder tumor  Drains/Catheters: 1.  18 French Foley catheter  Intraoperative findings:   1. Superficial papillary 1.7 cm bladder tumor adjacent, but not emanating from the right ureteral orifice  Indication:  Phillip [name] is a 73 y.o. male with a 1.7 cm papillary bladder tumor visualized on flexible cystoscopy as well as on recent CT urogram.  He has been consented for the above procedures, voices understanding and wishes to proceed.  Description of procedure:  The patient was taken to the operating room and administered general anesthesia. They were then placed on the table and moved to the dorsal lithotomy position after which the genitalia was sterilely prepped and draped. An official timeout was then performed.  The 58 French resectoscope with the 30 lens and visual obturator were then passed into the bladder under direct visualization. Urethra appeared normal. The visual obturator was then removed and the Gyrus resectoscope element with 30  lens was then inserted and the bladder was fully and systematically inspected. Ureteral orifices were noted to be in the normal anatomic positions.   I first began by resecting the 1.7 cm papillary tumor along the right lateral wall of the bladder.  The tumor was resected superficially, which was sent as a specimen separate specimen.  A deep detrusor specimen was also obtained from the tumor bed and sent off as a separate specimen as well.  Reinspection of the bladder revealed all obvious tumor had been fully resected and there was no evidence of  perforation. The Microvasive evacuator was then used to irrigate the bladder and remove all of the portions of bladder tumor which were sent to pathology. I then removed the resectoscope.  An 37 French Foley catheter was then inserted in the bladder and irrigated. The irrigant returned slightly pink with no clots. The patient was awakened and taken to the recovery room.  While in the recovery room 2000 mg of gemcitabine in 50 mL of water was instilled in the bladder through the catheter and the catheter was plugged. This will remain indwelling for approximately one hour. It will then be drained from the bladder and the catheter will be removed and the patient discharged home.   Plan: The patient will follow-up on 12/20/2018 to discuss his pathology results

## 2018-12-05 NOTE — Anesthesia Postprocedure Evaluation (Signed)
Anesthesia Post Note  Patient: Phillip [name]  Procedure(s) Performed: TRANSURETHRAL RESECTION OF BLADDER TUMOR (TURBT)/CYSTOSCOPY/  INSTILLATION OF CHEMICOTHERAPY (N/A Bladder)     Patient location during evaluation: PACU Anesthesia Type: General Level of consciousness: awake and alert and oriented Pain management: pain level controlled Vital Signs Assessment: post-procedure vital signs reviewed and stable Respiratory status: spontaneous breathing, nonlabored ventilation and respiratory function stable Cardiovascular status: blood pressure returned to baseline and stable Postop Assessment: no apparent nausea or vomiting Anesthetic complications: no    Last Vitals:  Vitals:   12/05/18 1155 12/05/18 1230  BP: (!) 141/85 132/83  Pulse:  62  Resp: (!) 8 11  Temp: 37 C   SpO2:  96%    Last Pain:  Vitals:   12/05/18 1220  TempSrc:   PainSc: 0-No pain                 Thresea Doble A.

## 2018-12-05 NOTE — Transfer of Care (Signed)
Immediate Anesthesia Transfer of Care Note  Patient: Phillip [name]  Procedure(s) Performed: TRANSURETHRAL RESECTION OF BLADDER TUMOR (TURBT)/CYSTOSCOPY/  INSTILLATION OF CHEMICOTHERAPY (N/A Bladder)  Patient Location: PACU  Anesthesia Type:General  Level of Consciousness: sedated and patient cooperative  Airway & Oxygen Therapy: Patient Spontanous Breathing and Patient connected to nasal cannula oxygen  Post-op Assessment: Report given to RN and Post -op Vital signs reviewed and stable  Post vital signs: Reviewed and stable  Last Vitals:  Vitals Value Taken Time  BP    Temp    Pulse 68 12/05/2018 11:55 AM  Resp    SpO2 97 % 12/05/2018 11:55 AM  Vitals shown include unvalidated device data.  Last Pain:  Vitals:   12/05/18 0946  TempSrc:   PainSc: 0-No pain      Patients Stated Pain Goal: 5 (63/49/49 [id number])  Complications: No apparent anesthesia complications

## 2018-12-05 NOTE — Discharge Instructions (Signed)

## 2018-12-06 ENCOUNTER — Encounter (HOSPITAL_BASED_OUTPATIENT_CLINIC_OR_DEPARTMENT_OTHER): Payer: Self-pay | Admitting: Urology

## 2018-12-07 ENCOUNTER — Other Ambulatory Visit: Payer: Self-pay

## 2018-12-07 ENCOUNTER — Encounter: Payer: Self-pay | Admitting: Emergency Medicine

## 2018-12-07 ENCOUNTER — Emergency Department: Payer: Medicare Other

## 2018-12-07 ENCOUNTER — Inpatient Hospital Stay
Admission: EM | Admit: 2018-12-07 | Discharge: 2018-12-09 | DRG: 176 | Disposition: A | Discharge status: Home / Self Care | Payer: Medicare Other | Attending: Internal Medicine | Admitting: Internal Medicine

## 2018-12-07 DIAGNOSIS — Z888 Allergy status to other drugs, medicaments and biological substances status: Secondary | ICD-10-CM

## 2018-12-07 DIAGNOSIS — Z791 Long term (current) use of non-steroidal anti-inflammatories (NSAID): Secondary | ICD-10-CM

## 2018-12-07 DIAGNOSIS — K219 Gastro-esophageal reflux disease without esophagitis: Secondary | ICD-10-CM | POA: Diagnosis present

## 2018-12-07 DIAGNOSIS — Z79899 Other long term (current) drug therapy: Secondary | ICD-10-CM | POA: Diagnosis not present

## 2018-12-07 DIAGNOSIS — Z7982 Long term (current) use of aspirin: Secondary | ICD-10-CM

## 2018-12-07 DIAGNOSIS — I2699 Other pulmonary embolism without acute cor pulmonale: Secondary | ICD-10-CM | POA: Diagnosis present

## 2018-12-07 DIAGNOSIS — R7989 Other specified abnormal findings of blood chemistry: Secondary | ICD-10-CM

## 2018-12-07 DIAGNOSIS — I10 Essential (primary) hypertension: Secondary | ICD-10-CM | POA: Diagnosis present

## 2018-12-07 DIAGNOSIS — Z8546 Personal history of malignant neoplasm of prostate: Secondary | ICD-10-CM | POA: Diagnosis not present

## 2018-12-07 DIAGNOSIS — Z79891 Long term (current) use of opiate analgesic: Secondary | ICD-10-CM

## 2018-12-07 DIAGNOSIS — Z9079 Acquired absence of other genital organ(s): Secondary | ICD-10-CM

## 2018-12-07 DIAGNOSIS — Z87442 Personal history of urinary calculi: Secondary | ICD-10-CM | POA: Diagnosis not present

## 2018-12-07 DIAGNOSIS — I82409 Acute embolism and thrombosis of unspecified deep veins of unspecified lower extremity: Secondary | ICD-10-CM

## 2018-12-07 DIAGNOSIS — M1990 Unspecified osteoarthritis, unspecified site: Secondary | ICD-10-CM | POA: Diagnosis present

## 2018-12-07 DIAGNOSIS — R778 Other specified abnormalities of plasma proteins: Secondary | ICD-10-CM

## 2018-12-07 DIAGNOSIS — C679 Malignant neoplasm of bladder, unspecified: Secondary | ICD-10-CM | POA: Diagnosis present

## 2018-12-07 DIAGNOSIS — I82461 Acute embolism and thrombosis of right calf muscular vein: Secondary | ICD-10-CM | POA: Diagnosis present

## 2018-12-07 DIAGNOSIS — Z7901 Long term (current) use of anticoagulants: Secondary | ICD-10-CM | POA: Diagnosis not present

## 2018-12-07 DIAGNOSIS — E785 Hyperlipidemia, unspecified: Secondary | ICD-10-CM | POA: Diagnosis present

## 2018-12-07 DIAGNOSIS — C61 Malignant neoplasm of prostate: Secondary | ICD-10-CM | POA: Insufficient documentation

## 2018-12-07 LAB — TROPONIN I
Troponin I: 0.03 ng/mL (ref <0.03)
Troponin I: 0.03 ng/mL (ref <0.03)

## 2018-12-07 LAB — CBC
HCT: 49.4 % (ref 39.0–52.0)
Hemoglobin: 16 g/dL (ref 13.0–17.0)
MCH: 32.3 pg (ref 26.0–34.0)
MCHC: 32.4 g/dL (ref 30.0–36.0)
MCV: 99.8 fL (ref 80.0–100.0)
Platelets: 170 10*3/uL (ref 150–400)
RBC: 4.95 MIL/uL (ref 4.22–5.81)
RDW: 14.6 % (ref 11.5–15.5)
WBC: 8.4 10*3/uL (ref 4.0–10.5)
nRBC: 0 % (ref 0.0–0.2)

## 2018-12-07 LAB — BASIC METABOLIC PANEL
Anion gap: 7 (ref 5–15)
BUN: 29 mg/dL — ABNORMAL HIGH (ref 8–23)
CO2: 27 mmol/L (ref 22–32)
Calcium: 9 mg/dL (ref 8.9–10.3)
Chloride: 107 mmol/L (ref 98–111)
Creatinine, Ser: 1.18 mg/dL (ref 0.61–1.24)
GFR calc Af Amer: >60 mL/min (ref >60)
GFR calc non Af Amer: >60 mL/min (ref >60)
Glucose, Bld: 111 mg/dL — ABNORMAL HIGH (ref 70–99)
POTASSIUM: 4.8 mmol/L (ref 3.5–5.1)
Sodium: 141 mmol/L (ref 135–145)

## 2018-12-07 LAB — PROTIME-INR
INR: 1.2 (ref 0.8–1.2)
Prothrombin Time: 14.6 seconds (ref 11.4–15.2)

## 2018-12-07 LAB — APTT: aPTT: 37 seconds — ABNORMAL HIGH (ref 24–36)

## 2018-12-07 MED ORDER — ONDANSETRON HCL 4 MG/2ML IJ SOLN: 4.0000 mg | Freq: Four times a day (QID) | INTRAMUSCULAR | Status: DC | PRN

## 2018-12-07 MED ORDER — OXYCODONE HCL 5 MG PO TABS
5.0000 mg | ORAL_TABLET | ORAL | Status: DC | PRN
  Administered 2018-12-08 (×2): 5 mg via ORAL
  Filled 2018-12-07 (×2): qty 1

## 2018-12-07 MED ORDER — SODIUM CHLORIDE 0.9 % IV BOLUS
1000.0000 mL | Freq: Once | INTRAVENOUS | Status: AC
  Administered 2018-12-07: 1000 mL via INTRAVENOUS

## 2018-12-07 MED ORDER — ACETAMINOPHEN 325 MG PO TABS: 650.0000 mg | ORAL_TABLET | Freq: Four times a day (QID) | ORAL | Status: DC | PRN

## 2018-12-07 MED ORDER — IOHEXOL 350 MG/ML SOLN
75.0000 mL | Freq: Once | INTRAVENOUS | Status: AC | PRN
  Administered 2018-12-07: 75 mL via INTRAVENOUS

## 2018-12-07 MED ORDER — ACETAMINOPHEN 650 MG RE SUPP: 650.0000 mg | Freq: Four times a day (QID) | RECTAL | Status: DC | PRN

## 2018-12-07 MED ORDER — PANTOPRAZOLE SODIUM 40 MG PO TBEC
40.0000 mg | DELAYED_RELEASE_TABLET | Freq: Every day | ORAL | Status: DC
  Administered 2018-12-08 – 2018-12-09 (×2): 40 mg via ORAL
  Filled 2018-12-07 (×2): qty 1

## 2018-12-07 MED ORDER — FENTANYL CITRATE (PF) 100 MCG/2ML IJ SOLN
75.0000 ug | Freq: Once | INTRAMUSCULAR | Status: AC
  Administered 2018-12-07: 75 ug via INTRAVENOUS
  Filled 2018-12-07: qty 2

## 2018-12-07 MED ORDER — ONDANSETRON HCL 4 MG PO TABS: 4.0000 mg | ORAL_TABLET | Freq: Four times a day (QID) | ORAL | Status: DC | PRN

## 2018-12-07 MED ORDER — SODIUM CHLORIDE 0.9% FLUSH
3.0000 mL | Freq: Once | INTRAVENOUS | Status: AC
  Administered 2018-12-07: 3 mL via INTRAVENOUS

## 2018-12-07 MED ORDER — NIACIN 500 MG PO TABS
500.0000 mg | ORAL_TABLET | Freq: Every day | ORAL | Status: DC
  Administered 2018-12-08: 500 mg via ORAL
  Filled 2018-12-07 (×3): qty 1

## 2018-12-07 NOTE — ED Provider Notes (Addendum)
[facility] Emergency Department Provider Note  ____________________________________________  Time seen: Approximately 8:11 PM  I have reviewed the triage vital signs and the nursing notes.   HISTORY  Chief Complaint Chest Pain    HPI Phillip [name] is a 73 y.o. 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  The patient reports a severe right-sided chest pain that started around 10 AM this morning is worse with any positional changes, or worse with deep breaths.  He has not been having any shortness of breath, palpitations, lightheadedness or syncope.  He has not had any cough or cold symptoms, fevers or chills.  He has not missed any of his Lovenox or Eliquis doses.  Patient has not tried anything for his pain.  Past Medical History:  Diagnosis Date  . Arthritis   . Bladder cancer ([place])   . Bladder neck contracture   . Cataract   . DVT (deep venous thrombosis) ([place]) 11/25/2018   right le  . Dyslipidemia   . ED (erectile dysfunction)   . Frequency   . GERD (gastroesophageal reflux disease)   . Hypertension   . Incontinence of urine    sui, s/p cryoablation  . Kidney stones   . Nocturia   . Prostate cancer ([place])    S/P   CRYOABLATION  . Right elbow tendinitis   . Wears glasses     There are no active problems to display for this patient.   Past Surgical History:  Procedure Laterality Date  . ARTHROPLASTY AND TENDON REPAIR LEFT THUMB  JAN 2014  . COLONOSCOPY    . COLONOSCOPY WITH PROPOFOL N/A 08/09/2017   Procedure: COLONOSCOPY WITH PROPOFOL;  Surgeon: Manya Silvas, MD;  Location[facility] ENDOSCOPY;  Service: Endoscopy;  Laterality: N/A;  . ESOPHAGOGASTRODUODENOSCOPY (EGD) WITH PROPOFOL N/A 08/09/2017   Procedure: ESOPHAGOGASTRODUODENOSCOPY (EGD) WITH PROPOFOL;  Surgeon: Manya Silvas, MD;  Location: [facility] ENDOSCOPY;  Service: Endoscopy;  Laterality: N/A;  . GREEN LIGHT  LASER TURP (TRANSURETHRAL RESECTION OF PROSTATE N/A 12/14/2015   Procedure: GREEN LIGHT LASER TURP (TRANSURETHRAL RESECTION OF PROSTATE) LASER OF BLADDER NECK CONTRACTURE;  Surgeon: Carolan Clines, MD;  Location: [place];  Service: Urology;  Laterality: N/A;  . PENILE PROSTHESIS IMPLANT N/A 12/06/2013   Procedure: PENILE PROTHESIS INFLATABLE;  Surgeon: Ailene Rud, MD;  Location: [facility];  Service: Urology;  Laterality: N/A;  . PROSTATE CRYOABLATION  06-01-2012  [facility]  . TRANSURETHRAL RESECTION OF BLADDER NECK N/A 03/20/2015   Procedure: RELEASE BLADDER NECK CONTRACTURE  WITH WOLF BUTTON ELECTRODE ;  Surgeon: Carolan Clines, MD;  Location: [place];  Service: Urology;  Laterality: N/A;  . TRANSURETHRAL RESECTION OF BLADDER TUMOR N/A 12/05/2018   Procedure: TRANSURETHRAL RESECTION OF BLADDER TUMOR (TURBT)/CYSTOSCOPY/  INSTILLATION OF Rodell Perna;  Surgeon: Ceasar Mons, MD;  Location: [facility];  Service: Urology;  Laterality: N/A;  . TRIGGER FINGER RELEASE Left 10/2015  . ulner nerve neuropathy      Current Outpatient Rx  . Order #: [id number] Class: Historical Med  . Order #: [id number] Class: Historical Med  . Order #: [id number] Class: Historical Med  . Order #: [id number] Class: Normal  . Order #: [id number] Class: Historical Med  . Order #: [id number] Class: Historical Med  . Order #: [id number] Class: Historical Med  . Order #: [id number] Class: Print  . Order #: [id number] Class: Historical Med  . Order #: [id number] Class: Historical Med  . Order #:  [id number] Class: Historical Med  . Order #: [id number] Class: Historical Med  . Order #: [id number] Class: Historical Med  . Order #: [id number] Class: Historical Med  . Order #: [id number] Class: Normal  . Order #: [id number] Class: Normal  . Order #: [id number] Class: Historical Med  . Order #: [id number] Class: Historical Med  . Order #: [id number] Class: Print  .  Order #: [id number] Class: Normal  . Order #: [id number] Class: Historical Med    Allergies Doxazosin  No family history on file.  Social History Social History   Tobacco Use  . Smoking status: Never Smoker  . Smokeless tobacco: Never Used  Substance Use Topics  . Alcohol use: No  . Drug use: No    Review of Systems Constitutional: No fever/chills.  No lightheadedness or syncope. Eyes: No visual changes. ENT: No sore throat. No congestion or rhinorrhea. Cardiovascular: Positive chest pain, worse with positional changes or deep breaths.. Denies palpitations. Respiratory: Denies shortness of breath.  No cough. Gastrointestinal: No abdominal pain.  No nausea, no vomiting.  No diarrhea.  No constipation. Genitourinary: Negative for dysuria. Musculoskeletal: Negative for back pain.  No lower extremity swelling or calf pain. Skin: Negative for rash. Neurological: Negative for headaches. No focal numbness, tingling or weakness.     ____________________________________________   PHYSICAL EXAM:  VITAL SIGNS: ED Triage Vitals  Enc Vitals Group     BP 12/07/18 1810 122/73     Pulse Rate 12/07/18 1810 72     Resp 12/07/18 1810 16     Temp 12/07/18 1810 98 F (36.7 C)     Temp Source 12/07/18 1810 Oral     SpO2 12/07/18 1810 100 %     Weight 12/07/18 1811 170 lb (77.1 kg)     Height 12/07/18 1811 5\' 9"  (1.753 m)     Head Circumference --      Peak Flow --      Pain Score 12/07/18 1810 8     Pain Loc --      Pain Edu? --      Excl. in [place]? --     Constitutional: Alert and oriented.  Answers questions appropriately. Eyes: Conjunctivae are normal.  EOMI. No scleral icterus. Head: Atraumatic. Nose: No congestion/rhinnorhea. Mouth/Throat: Mucous membranes are moist.  Neck: No stridor.  Supple.  No JVD.  No meningismus. Cardiovascular: Normal rate, regular rhythm. No murmurs, rubs or gallops.  No reproducible tenderness to palpation on the chest. Respiratory: Normal  respiratory effort.  No accessory muscle use or retractions. Lungs CTAB.  No wheezes, rales or ronchi. Gastrointestinal: Soft, nontender and nondistended.  No guarding or rebound.  No peritoneal signs. Musculoskeletal: No LE edema. No ttp in the calves or palpable cords.  Negative Homan's sign. Neurologic:  A&Ox3.  Speech is clear.  Face and smile are symmetric.  EOMI.  Moves all extremities well. Skin:  Skin is warm, dry and intact. No rash noted. Psychiatric: Mood and affect are normal. Speech and behavior are normal.  Normal judgement  ____________________________________________   LABS (all labs ordered are listed, but only abnormal results are displayed)  Labs Reviewed  BASIC METABOLIC PANEL - Abnormal; Notable for the following components:      Result Value   Glucose, Bld 111 (*)    BUN 29 (*)    All other components within normal limits  TROPONIN I - Abnormal; Notable for the following components:   Troponin I 0.03 (*)    All other components within normal limits  TROPONIN I - Abnormal; Notable for  the following components:   Troponin I 0.03 (*)    All other components within normal limits  APTT - Abnormal; Notable for the following components:   aPTT 37 (*)    All other components within normal limits  CBC  PROTIME-INR   ____________________________________________  EKG  ED ECG REPORT I, Anne-Caroline Mariea Clonts, the attending physician, personally viewed and interpreted this ECG.   Date: 12/07/2018  EKG Time: 1807  Rate: 68  Rhythm: normal sinus rhythm  Axis: leftward  Intervals:first degree block  ST&T Change: very poor baseline tracing - will repeat.  ____________________________________________  RADIOLOGY  Ct Angio Chest Pe W And/or Wo Contrast  Result Date: 12/07/2018 CLINICAL DATA:  Right-sided chest pain. Recent diagnosis of DVT, currently on Lovenox and Eliquis. EXAM: CT ANGIOGRAPHY CHEST WITH CONTRAST TECHNIQUE: Multidetector CT imaging of the chest was  performed using the standard protocol during bolus administration of intravenous contrast. Multiplanar CT image reconstructions and MIPs were obtained to evaluate the vascular anatomy. CONTRAST:  71mL OMNIPAQUE IOHEXOL 350 MG/ML SOLN COMPARISON:  None. FINDINGS: Cardiovascular: Satisfactory opacification of the pulmonary arteries to the segmental level. There are nonocclusive segmental and subsegmental pulmonary emboli within the right upper lobe, right middle lobe and right lower lobe pulmonary branches. The heart is mildly enlarged. No definite evidence of right heart strain. The RV/LV ratio is 0.9. The heart is mildly enlarged. There is no pericardial effusion. There are heavy calcific atherosclerotic calcifications of the coronary arteries. Mediastinum/Nodes: No enlarged mediastinal, hilar, or axillary lymph nodes. Thyroid gland, trachea, and esophagus demonstrate no significant findings. Lungs/Pleura: Hazy ground-glass opacities in the dependent portion of the right upper lobe and right lower lobe may represent areas of hypoperfusion versus atelectasis. Upper Abdomen: No acute abnormality. Musculoskeletal: No chest wall abnormality. No acute or significant osseous findings. Review of the MIP images confirms the above findings. IMPRESSION: 1. Nonocclusive segmental and subsegmental pulmonary emboli within the right upper lobe, right middle lobe and right lower lobe pulmonary branches. No evidence of right heart strain. 2. Mildly enlarged heart. 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 3. Hazy ground-glass opacities in the dependent portions of the right upper lobe and right lower lobe may represent areas of hypoperfusion versus atelectasis. Aortic Atherosclerosis (ICD10-I70.0). These results were called by telephone at the time of interpretation on 12/07/2018 at 9:15 pm to Dr. Eula Listen , who verbally acknowledged these results. Electronically Signed   By: Fidela Salisbury  M.D.   On: 12/07/2018 21:17    ____________________________________________   PROCEDURES  Procedure(s) performed: None  Procedures  Critical Care performed: Yes ____________________________________________   INITIAL IMPRESSION / ASSESSMENT AND PLAN / ED COURSE  Pertinent labs & imaging results that were available during my care of the patient were reviewed by me and considered in my medical decision making (see chart for details).  73 y.o. male with a history of DVT on Eliquis and Lovenox, recent bladder tumor resection, presenting with chest pain.  On my exam, any movement makes the patient's chest pain worse, especially sitting forward, but he does not have reproducible chest pain when I push on his chest.  His EKG is inadequate we will repeat his EKG for evaluation of ACS or MI; his first troponin is 0.03 and this will be repeated.  The patient will be given fentanyl; aspirin is not indicated given his Eliquis and Lovenox use.  Other etiologies include musculoskeletal pain.  CT has been ordered for PE rule out.  The patient will get  symptomatic treatment.  Plan reevaluation for final disposition.  ----------------------------------------- 9:33 PM on 12/07/2018 -----------------------------------------  I have spoken with the radiologist, who states the patient has multiple small nonocclusive pulmonary emboli in the right upper lobe, right middle lobe and the right lower lobe.  He is already on Lovenox and Eliquis and failing outpatient therapy so plan to admit him to the hospital.  He does have an elevated troponin, which may be a sign of strain on the heart.  I have spoken with Dr. Lorenso Courier, vascular surgeon on call.  This patient would not be a candidate for arterial intervention at this time.  She does recommend heparinizing the patient, but later in the night since he just had a dose of Lovenox.  In addition, switching to Coumadin may be indicated; a filter can be considered if he  cannot tolerate anticoagulation.  Dr. Jannifer Franklin, the hospitalist on-call, will admit the patient at this time.  CRITICAL CARE Performed by: Eula Listen   Total critical care time: 30 minutes  Critical care time was exclusive of separately billable procedures and treating other patients.  Critical care was necessary to treat or prevent imminent or life-threatening deterioration.  Critical care was time spent personally by me on the following activities: development of treatment plan with patient and/or surrogate as well as nursing, discussions with consultants, evaluation of patient's response to treatment, examination of patient, obtaining history from patient or surrogate, ordering and performing treatments and interventions, ordering and review of laboratory studies, ordering and review of radiographic studies, pulse oximetry and re-evaluation of patient's condition.   ____________________________________________  FINAL CLINICAL IMPRESSION(S) / ED DIAGNOSES  Final diagnoses:  Multiple pulmonary emboli (HCC)  Elevated troponin         NEW MEDICATIONS STARTED DURING THIS VISIT:  New Prescriptions   No medications on file      Eula Listen, MD 12/07/18 2134    Eula Listen, MD 12/07/18 2145

## 2018-12-07 NOTE — H&P (Signed)
[facility] NAME: Phillip [name]    MR#:  [id number]  DATE OF BIRTH:  [birth year]  DATE OF ADMISSION:  12/07/2018  PRIMARY CARE PHYSICIAN: Rusty Aus, MD   REQUESTING/REFERRING PHYSICIAN: Mariea Clonts, MD  CHIEF COMPLAINT:   Chief Complaint  Patient presents with  . Chest Pain    HISTORY OF PRESENT ILLNESS:  Phillip [name]  is a 73 y.o. male who presents with chief complaint as above.  Patient developed chest discomfort around 10:00 in the morning.  He was diagnosed with DVT about 2 weeks ago and started on anticoagulation.  He was also diagnosed with a bladder tumor.  He stopped his anticoagulation for 2 days prior to procedure to remove his bladder tumor.  This procedure was done 2 days ago.  Patient states that on the day of the procedure after it was done he developed significant right calf pain.  He restarted anticoagulation.  In the ED tonight he is shown on imaging to have bilateral pulmonary emboli.  Vascular surgery contacted by ED physician and recommends heparin drip for now, with low likelihood of intervention.  Hospitalist were called for admission  PAST MEDICAL HISTORY:   Past Medical History:  Diagnosis Date  . Arthritis   . Bladder cancer ([place])   . Bladder neck contracture   . Cataract   . DVT (deep venous thrombosis) ([place]) 11/25/2018   right le  . Dyslipidemia   . ED (erectile dysfunction)   . Frequency   . GERD (gastroesophageal reflux disease)   . Hypertension   . Incontinence of urine    sui, s/p cryoablation  . Kidney stones   . Nocturia   . Prostate cancer ([place])    S/P   CRYOABLATION  . Right elbow tendinitis   . Wears glasses      PAST SURGICAL HISTORY:   Past Surgical History:  Procedure Laterality Date  . ARTHROPLASTY AND TENDON REPAIR LEFT THUMB  JAN 2014  . COLONOSCOPY    . COLONOSCOPY WITH PROPOFOL N/A 08/09/2017   Procedure: COLONOSCOPY WITH PROPOFOL;  Surgeon: Manya Silvas, MD;  Location: [facility] ENDOSCOPY;  Service: Endoscopy;  Laterality: N/A;  . ESOPHAGOGASTRODUODENOSCOPY (EGD) WITH PROPOFOL N/A 08/09/2017   Procedure: ESOPHAGOGASTRODUODENOSCOPY (EGD) WITH PROPOFOL;  Surgeon: Manya Silvas, MD;  Location: [facility] ENDOSCOPY;  Service: Endoscopy;  Laterality: N/A;  . GREEN LIGHT LASER TURP (TRANSURETHRAL RESECTION OF PROSTATE N/A 12/14/2015   Procedure: GREEN LIGHT LASER TURP (TRANSURETHRAL RESECTION OF PROSTATE) LASER OF BLADDER NECK CONTRACTURE;  Surgeon: Carolan Clines, MD;  Location: [facility];  Service: Urology;  Laterality: N/A;  . PENILE PROSTHESIS IMPLANT N/A 12/06/2013   Procedure: PENILE PROTHESIS INFLATABLE;  Surgeon: Ailene Rud, MD;  Location: [facility];  Service: Urology;  Laterality: N/A;  . PROSTATE CRYOABLATION  06-01-2012  [facility]  . TRANSURETHRAL RESECTION OF BLADDER NECK N/A 03/20/2015   Procedure: RELEASE BLADDER NECK CONTRACTURE  WITH WOLF BUTTON ELECTRODE ;  Surgeon: Carolan Clines, MD;  Location: [facility];  Service: Urology;  Laterality: N/A;  . TRANSURETHRAL RESECTION OF BLADDER TUMOR N/A 12/05/2018   Procedure: TRANSURETHRAL RESECTION OF BLADDER TUMOR (TURBT)/CYSTOSCOPY/  INSTILLATION OF Rodell Perna;  Surgeon: Ceasar Mons, MD;  Location: [facility];  Service: Urology;  Laterality: N/A;  . TRIGGER FINGER RELEASE Left 10/2015  . ulner nerve neuropathy       SOCIAL HISTORY:   Social History   Tobacco  Use  . Smoking status: Never Smoker  . Smokeless tobacco: Never Used  Substance Use Topics  . Alcohol use: No     FAMILY HISTORY:    Family history reviewed and is non-contributory DRUG ALLERGIES:   Allergies  Allergen Reactions  . Doxazosin     Other reaction(s): Unknown    MEDICATIONS AT HOME:   Prior to Admission medications   Medication Sig Start Date End Date Taking? Authorizing Provider  amLODipine (NORVASC) 5 MG tablet  Take 5 mg by mouth daily.    [provider]  aspirin EC 81 MG tablet Take 81 mg by mouth daily.    [provider]  benazepril (LOTENSIN) 20 MG tablet Take 20 mg by mouth daily.    [provider]  cephALEXin (KEFLEX) 500 MG capsule Take 1 capsule (500 mg total) by mouth 2 (two) times daily for 3 days. 12/05/18 12/08/18  Ceasar Mons, MD  cetirizine (ZYRTEC) 10 MG tablet Take 10 mg by mouth daily.    [provider]  cholecalciferol (VITAMIN D3) 25 MCG (1000 UT) tablet Take 1,000 Units by mouth daily.    [provider]  Chromium 1 MG CAPS Take by mouth. 1 per day    [provider]  Eliquis DVT/PE Starter Pack (ELIQUIS STARTER PACK) 5 MG TABS Take as directed on package: start with two-5mg  tablets twice daily for 7 days. On day 8, switch to one-5mg  tablet twice daily. 11/26/18   Rudene Re, MD  enoxaparin (LOVENOX) 80 MG/0.8ML injection Inject 80 mg into the skin 2 (two) times daily. To stop Sunday 12-02-2018 in pm for surgery 12-05-2018 per patient    [provider]  Glucosamine-Chondroit-Vit C-Mn (GLUCOSAMINE CHONDR 1500 COMPLX PO) Take 1 tablet by mouth daily.    [provider]  Multiple Vitamin (MULTIVITAMIN) tablet Take 1 tablet by mouth daily.    [provider]  naproxen sodium (ANAPROX) 220 MG tablet Take 220 mg by mouth as needed.    [provider]  niacin 500 MG tablet Take 500 mg by mouth at bedtime.    [provider]  omeprazole (PRILOSEC) 40 MG capsule  08/27/16   [provider]  oxybutynin (DITROPAN) 5 MG tablet Take 1 tablet (5 mg total) by mouth every 8 (eight) hours as needed for bladder spasms. 12/05/18   Ceasar Mons, MD  phenazopyridine (PYRIDIUM) 200 MG tablet Take 1 tablet (200 mg total) by mouth 3 (three) times daily as needed (for pain with urination). 12/05/18 12/05/19  Ceasar Mons, MD  potassium phosphate, monobasic, (K-PHOS  ORIGINAL) 500 MG tablet Take 500 mg by mouth daily.    [provider]  testosterone cypionate (DEPOTESTOSTERONE CYPIONATE) 200 MG/ML injection  08/03/16   [provider]  traMADol (ULTRAM) 50 MG tablet Take 1 tablet (50 mg total) by mouth every 6 (six) hours as needed. 11/26/18 11/26/19  Rudene Re, MD  traMADol (ULTRAM) 50 MG tablet Take 1 tablet (50 mg total) by mouth every 6 (six) hours as needed for up to 3 days. 12/05/18 12/08/18  Ceasar Mons, MD  UNABLE TO FIND Tumeric 1 q day    [provider]    REVIEW OF SYSTEMS:  Review of Systems  Constitutional: Negative for chills, fever, malaise/fatigue and weight loss.  HENT: Negative for ear pain, hearing loss and tinnitus.   Eyes: Negative for blurred vision, double vision, pain and redness.  Respiratory: Positive for shortness of breath. Negative for cough  and hemoptysis.   Cardiovascular: Positive for chest pain. Negative for palpitations, orthopnea and leg swelling.  Gastrointestinal: Negative for abdominal pain, constipation, diarrhea, nausea and vomiting.  Genitourinary: Negative for dysuria, frequency and hematuria.  Musculoskeletal: Negative for back pain, joint pain and neck pain.  Skin:       No acne, rash, or lesions  Neurological: Negative for dizziness, tremors, focal weakness and weakness.  Endo/Heme/Allergies: Negative for polydipsia. Does not bruise/bleed easily.  Psychiatric/Behavioral: Negative for depression. The patient is not nervous/anxious and does not have insomnia.      VITAL SIGNS:   Vitals:   12/07/18 1811 12/07/18 2200 12/07/18 2215 12/07/18 2230  BP:  139/79  134/79  Pulse:  73 74 76  Resp:  17 14 14   Temp:      TempSrc:      SpO2:  95% 94% 95%  Weight: 77.1 kg     Height: 5\' 9"  (1.753 m)      Wt Readings from Last 3 Encounters:  12/07/18 77.1 kg  12/05/18 77.8 kg  11/25/18 77.1 kg    PHYSICAL EXAMINATION:  Physical Exam  Vitals  reviewed. Constitutional: He is oriented to person, place, and time. He appears well-developed and well-nourished. No distress.  HENT:  Head: Normocephalic and atraumatic.  Mouth/Throat: Oropharynx is clear and moist.  Eyes: Pupils are equal, round, and reactive to light. Conjunctivae and EOM are normal. No scleral icterus.  Neck: Normal range of motion. Neck supple. No JVD present. No thyromegaly present.  Cardiovascular: Normal rate, regular rhythm and intact distal pulses. Exam reveals no gallop and no friction rub.  No murmur heard. Respiratory: Effort normal and breath sounds normal. No respiratory distress. He has no wheezes. He has no rales.  GI: Soft. Bowel sounds are normal. He exhibits no distension. There is no abdominal tenderness.  Musculoskeletal: Normal range of motion.        General: No edema.     Comments: No arthritis, no gout  Lymphadenopathy:    He has no cervical adenopathy.  Neurological: He is alert and oriented to person, place, and time. No cranial nerve deficit.  No dysarthria, no aphasia  Skin: Skin is warm and dry. No rash noted. No erythema.  Psychiatric: He has a normal mood and affect. His behavior is normal. Judgment and thought content normal.    LABORATORY PANEL:   CBC Recent Labs  Lab 12/07/18 1813  WBC 8.4  HGB 16.0  HCT 49.4  PLT 170   ------------------------------------------------------------------------------------------------------------------  Chemistries  Recent Labs  Lab 12/07/18 1813  NA 141  K 4.8  CL 107  CO2 27  GLUCOSE 111*  BUN 29*  CREATININE 1.18  CALCIUM 9.0   ------------------------------------------------------------------------------------------------------------------  Cardiac Enzymes Recent Labs  Lab 12/07/18 2035  TROPONINI 0.03*   ------------------------------------------------------------------------------------------------------------------  RADIOLOGY:  Ct Angio Chest Pe W And/or Wo  Contrast  Result Date: 12/07/2018 CLINICAL DATA:  Right-sided chest pain. Recent diagnosis of DVT, currently on Lovenox and Eliquis. EXAM: CT ANGIOGRAPHY CHEST WITH CONTRAST TECHNIQUE: Multidetector CT imaging of the chest was performed using the standard protocol during bolus administration of intravenous contrast. Multiplanar CT image reconstructions and MIPs were obtained to evaluate the vascular anatomy. CONTRAST:  52mL OMNIPAQUE IOHEXOL 350 MG/ML SOLN COMPARISON:  None. FINDINGS: Cardiovascular: Satisfactory opacification of the pulmonary arteries to the segmental level. There are nonocclusive segmental and subsegmental pulmonary emboli within the right upper lobe, right middle lobe and right lower lobe pulmonary branches. The heart is mildly enlarged.  No definite evidence of right heart strain. The RV/LV ratio is 0.9. The heart is mildly enlarged. There is no pericardial effusion. There are heavy calcific atherosclerotic calcifications of the coronary arteries. Mediastinum/Nodes: No enlarged mediastinal, hilar, or axillary lymph nodes. Thyroid gland, trachea, and esophagus demonstrate no significant findings. Lungs/Pleura: Hazy ground-glass opacities in the dependent portion of the right upper lobe and right lower lobe may represent areas of hypoperfusion versus atelectasis. Upper Abdomen: No acute abnormality. Musculoskeletal: No chest wall abnormality. No acute or significant osseous findings. Review of the MIP images confirms the above findings. IMPRESSION: 1. Nonocclusive segmental and subsegmental pulmonary emboli within the right upper lobe, right middle lobe and right lower lobe pulmonary branches. No evidence of right heart strain. 2. Mildly enlarged heart. 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 3. Hazy ground-glass opacities in the dependent portions of the right upper lobe and right lower lobe may represent areas of hypoperfusion versus atelectasis. Aortic Atherosclerosis  (ICD10-I70.0). These results were called by telephone at the time of interpretation on 12/07/2018 at 9:15 pm to Dr. Eula Listen , who verbally acknowledged these results. Electronically Signed   By: Fidela Salisbury M.D.   On: 12/07/2018 21:17    EKG:   Orders placed or performed during the hospital encounter of 12/07/18  . ED EKG  . ED EKG    IMPRESSION AND PLAN:  Principal Problem:   Pulmonary emboli (HCC) -patient has been on Lovenox and Eliquis, but developed pulmonary emboli anyway.  Some thought that he potentially had increase in his DVT during the couple of days that he was off his anticoagulation for his procedure, and then may have had mechanical dislodgment of part of his clot which moved to his lungs.  That being said we will get a vascular surgery consult, and place him on a heparin drip per their recommendation Active Problems:   HTN (hypertension) -home dose antihypertensives   GERD (gastroesophageal reflux disease) -home dose PPI   HLD (hyperlipidemia) -home dose antilipid   Bladder cancer ([place]) -status post TURBT by urology on 12/05/18, continue to follow with them  Chart review performed and case discussed with ED provider. Labs, imaging and/or ECG reviewed by provider and discussed with patient/family. Management plans discussed with the patient and/or family.  DVT PROPHYLAXIS: Systemic anticoagulation  GI PROPHYLAXIS:  PPI   ADMISSION STATUS: Inpatient     CODE STATUS: Full Code Status History    Date Active Date Inactive Code Status Order ID Comments User Context   12/06/2013 1430 12/07/2013 1503 Full Code [id number]  Ailene Rud, MD Inpatient    Advance Directive Documentation     Most Recent Value  Type of Advance Directive  Living will  Pre-existing out of facility DNR order (yellow form or pink MOST form)  -  "MOST" Form in Place?  -      TOTAL TIME TAKING CARE OF THIS PATIENT: 45 minutes.   Ethlyn Daniels 12/07/2018, 11:04  PM  [facility]  Office  [phone]  CC: Primary care physician; Rusty Aus, MD  Note:  This document was prepared using Dragon voice recognition software and may include unintentional dictation errors.

## 2018-12-07 NOTE — ED Notes (Signed)
ED TO INPATIENT HANDOFF REPORT  ED Nurse Name and Phone #: Gwynn Burly Name/Age/Gender Phillip [name] 73 y.o. male Room/Bed: ED18A/ED18A  Code Status   Code Status: Full Code  Home/SNF/Other Home Patient oriented to: self, place, time and situation Is this baseline? Yes   Triage Complete: Triage complete  Chief Complaint Chest Pain  Triage Note Pt to ED via POV c/o right sided chest pain since this morning around 10 am. Pt states that he was seen last week and diagnosed with DVT, pt is currently taking Lovenox and Eliquis. Pt had surgery on Wednesday to have a tumor removed from his bladder. Pt denies shortness of breath. Pain is worse with movement. Pt is in NAD.    Allergies Allergies  Allergen Reactions  . Doxazosin Other (See Comments)    Other reaction(s): Unknown    Level of Care/Admitting Diagnosis ED Disposition    ED Disposition Condition [facility] Area: [place] [[id number]]  Level of Care: Med-Surg [16]  Diagnosis: Pulmonary emboli [facility]) [[id number]]  Admitting Physician: Lance Coon [[id number]]  Attending Physician: Jannifer Franklin, DAVID [phone]  Estimated length of stay: past midnight tomorrow  Certification:: I certify this patient will need inpatient services for at least 2 midnights  PT Class (Do Not Modify): Inpatient [101]  PT Acc Code (Do Not Modify): Private [1]       B Medical/Surgery History Past Medical History:  Diagnosis Date  . Arthritis   . Bladder cancer ([place])   . Bladder neck contracture   . Cataract   . DVT (deep venous thrombosis) ([place]) 11/25/2018   right le  . Dyslipidemia   . ED (erectile dysfunction)   . Frequency   . GERD (gastroesophageal reflux disease)   . Hypertension   . Incontinence of urine    sui, s/p cryoablation  . Kidney stones   . Nocturia   . Prostate cancer ([place])    S/P   CRYOABLATION  . Right elbow tendinitis   . Wears glasses    Past Surgical History:  Procedure  Laterality Date  . ARTHROPLASTY AND TENDON REPAIR LEFT THUMB  JAN 2014  . COLONOSCOPY    . COLONOSCOPY WITH PROPOFOL N/A 08/09/2017   Procedure: COLONOSCOPY WITH PROPOFOL;  Surgeon: Manya Silvas, MD;  Location: [facility] ENDOSCOPY;  Service: Endoscopy;  Laterality: N/A;  . ESOPHAGOGASTRODUODENOSCOPY (EGD) WITH PROPOFOL N/A 08/09/2017   Procedure: ESOPHAGOGASTRODUODENOSCOPY (EGD) WITH PROPOFOL;  Surgeon: Manya Silvas, MD;  Location: [facility] ENDOSCOPY;  Service: Endoscopy;  Laterality: N/A;  . GREEN LIGHT LASER TURP (TRANSURETHRAL RESECTION OF PROSTATE N/A 12/14/2015   Procedure: GREEN LIGHT LASER TURP (TRANSURETHRAL RESECTION OF PROSTATE) LASER OF BLADDER NECK CONTRACTURE;  Surgeon: Carolan Clines, MD;  Location: [place];  Service: Urology;  Laterality: N/A;  . PENILE PROSTHESIS IMPLANT N/A 12/06/2013   Procedure: PENILE PROTHESIS INFLATABLE;  Surgeon: Ailene Rud, MD;  Location: [facility];  Service: Urology;  Laterality: N/A;  . PROSTATE CRYOABLATION  06-01-2012  [facility]  . TRANSURETHRAL RESECTION OF BLADDER NECK N/A 03/20/2015   Procedure: RELEASE BLADDER NECK CONTRACTURE  WITH WOLF BUTTON ELECTRODE ;  Surgeon: Carolan Clines, MD;  Location: [place];  Service: Urology;  Laterality: N/A;  . TRANSURETHRAL RESECTION OF BLADDER TUMOR N/A 12/05/2018   Procedure: TRANSURETHRAL RESECTION OF BLADDER TUMOR (TURBT)/CYSTOSCOPY/  INSTILLATION OF Rodell Perna;  Surgeon: Ceasar Mons, MD;  Location: [facility];  Service: Urology;  Laterality: N/A;  .  TRIGGER FINGER RELEASE Left 10/2015  . ulner nerve neuropathy       A IV Location/Drains/Wounds Patient Lines/Drains/Airways Status   Active Line/Drains/Airways    Name:   Placement date:   Placement time:   Site:   Days:   Peripheral IV 12/07/18 Right   12/07/18    2044    -   less than 1   Urethral Catheter MATT LYONS (RESIDENT) Latex;Coude 20 Fr.   03/20/15     1151    Latex;Coude   1358   Urethral Catheter Dr. Gaynelle Arabian Latex 20 Fr.   12/14/15    0913    Latex   1089   Urethral Catheter C. Ermalinda Barrios, RN Latex;Straight-tip 16 Fr.   12/05/18    1745    Latex;Straight-tip   2   Airway   12/05/18    1043     2   Incision (Closed) 12/06/13 Scrotum Other (Comment)   12/06/13    1158     1827   Incision (Closed) 03/20/15 Perineum Other (Comment)   03/20/15    1019     1358   Incision (Closed) 12/14/15 Perineum   12/14/15    0908     1089   Incision (Closed) 12/05/18 Perineum Other (Comment)   12/05/18    1109     2          Intake/Output Last 24 hours No intake or output data in the 24 hours ending 12/07/18 2341  Labs/Imaging Results for orders placed or performed during the hospital encounter of 12/07/18 (from the past 48 hour(s))  Basic metabolic panel     Status: Abnormal   Collection Time: 12/07/18  6:13 PM  Result Value Ref Range   Sodium 141 135 - 145 mmol/L   Potassium 4.8 3.5 - 5.1 mmol/L   Chloride 107 98 - 111 mmol/L   CO2 27 22 - 32 mmol/L   Glucose, Bld 111 (H) 70 - 99 mg/dL   BUN 29 (H) 8 - 23 mg/dL   Creatinine, Ser 1.18 0.61 - 1.24 mg/dL   Calcium 9.0 8.9 - 10.3 mg/dL   GFR calc non Af Amer >60 >60 mL/min   GFR calc Af Amer >60 >60 mL/min   Anion gap 7 5 - 15    Comment: Performed at [facility], [address] [place], [place] [address]  CBC     Status: None   Collection Time: 12/07/18  6:13 PM  Result Value Ref Range   WBC 8.4 4.0 - 10.5 K/uL   RBC 4.95 4.22 - 5.81 MIL/uL   Hemoglobin 16.0 13.0 - 17.0 g/dL   HCT 49.4 39.0 - 52.0 %   MCV 99.8 80.0 - 100.0 fL   MCH 32.3 26.0 - 34.0 pg   MCHC 32.4 30.0 - 36.0 g/dL   RDW 14.6 11.5 - 15.5 %   Platelets 170 150 - 400 K/uL   nRBC 0.0 0.0 - 0.2 %    Comment: Performed at [facility], [address] [place], [place] [address]  Troponin I - ONCE - STAT     Status: Abnormal   Collection Time: 12/07/18  6:13 PM  Result Value Ref Range   Troponin I 0.03  (HH) <0.03 ng/mL    Comment: CRITICAL RESULT CALLED TO, READ BACK BY AND VERIFIED WITH BRANDY DAVIS 12/07/18 @ [place] Performed at [facility], [address], [place], [place] [address]   Protime-INR  Status: None   Collection Time: 12/07/18  6:13 PM  Result Value Ref Range   Prothrombin Time 14.6 11.4 - 15.2 seconds   INR 1.2 0.8 - 1.2    Comment: (NOTE) INR goal varies based on device and disease states. Performed at [facility], [address] [place], [place] [address]   APTT     Status: Abnormal   Collection Time: 12/07/18  6:13 PM  Result Value Ref Range   aPTT 37 (H) 24 - 36 seconds    Comment:        IF BASELINE aPTT IS ELEVATED, SUGGEST PATIENT RISK ASSESSMENT BE USED TO DETERMINE APPROPRIATE ANTICOAGULANT THERAPY. Performed at [facility], [address] [place], [place] [address]   Troponin I - ONCE - STAT     Status: Abnormal   Collection Time: 12/07/18  8:35 PM  Result Value Ref Range   Troponin I 0.03 (HH) <0.03 ng/mL    Comment: CRITICAL VALUE NOTED. VALUE IS CONSISTENT WITH PREVIOUSLY REPORTED/CALLED VALUE  / [place] Performed at [facility], [address] [place], [place] [address]    Ct Angio Chest Pe W And/or Wo Contrast  Result Date: 12/07/2018 CLINICAL DATA:  Right-sided chest pain. Recent diagnosis of DVT, currently on Lovenox and Eliquis. EXAM: CT ANGIOGRAPHY CHEST WITH CONTRAST TECHNIQUE: Multidetector CT imaging of the chest was performed using the standard protocol during bolus administration of intravenous contrast. Multiplanar CT image reconstructions and MIPs were obtained to evaluate the vascular anatomy. CONTRAST:  71mL OMNIPAQUE IOHEXOL 350 MG/ML SOLN COMPARISON:  None. FINDINGS: Cardiovascular: Satisfactory opacification of the pulmonary arteries to the segmental level. There are nonocclusive segmental and subsegmental pulmonary emboli within the right upper lobe, right middle lobe and right lower  lobe pulmonary branches. The heart is mildly enlarged. No definite evidence of right heart strain. The RV/LV ratio is 0.9. The heart is mildly enlarged. There is no pericardial effusion. There are heavy calcific atherosclerotic calcifications of the coronary arteries. Mediastinum/Nodes: No enlarged mediastinal, hilar, or axillary lymph nodes. Thyroid gland, trachea, and esophagus demonstrate no significant findings. Lungs/Pleura: Hazy ground-glass opacities in the dependent portion of the right upper lobe and right lower lobe may represent areas of hypoperfusion versus atelectasis. Upper Abdomen: No acute abnormality. Musculoskeletal: No chest wall abnormality. No acute or significant osseous findings. Review of the MIP images confirms the above findings. IMPRESSION: 1. Nonocclusive segmental and subsegmental pulmonary emboli within the right upper lobe, right middle lobe and right lower lobe pulmonary branches. No evidence of right heart strain. 2. Mildly enlarged heart. 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 3. Hazy ground-glass opacities in the dependent portions of the right upper lobe and right lower lobe may represent areas of hypoperfusion versus atelectasis. Aortic Atherosclerosis (ICD10-I70.0). These results were called by telephone at the time of interpretation on 12/07/2018 at 9:15 pm to Dr. Eula Listen , who verbally acknowledged these results. Electronically Signed   By: Fidela Salisbury M.D.   On: 12/07/2018 21:17    Pending Labs Unresulted Labs (From admission, onward)    Start     Ordered   12/08/18 5320  Basic metabolic panel  Tomorrow morning,   STAT     12/07/18 2322   12/08/18 0500  CBC  Tomorrow morning,   STAT     12/07/18 2322          Vitals/Pain Today's Vitals   12/07/18 1811 12/07/18 2200 12/07/18 2215 12/07/18 2230  BP:  139/79  134/79  Pulse:  73 74 76  Resp:  17 14 14   Temp:      TempSrc:      SpO2:  95% 94% 95%  Weight: 77.1  kg     Height: 5\' 9"  (1.753 m)     PainSc:        Isolation Precautions No active isolations  Medications Medications  pantoprazole (PROTONIX) EC tablet 40 mg (has no administration in time range)  niacin tablet 500 mg (has no administration in time range)  acetaminophen (TYLENOL) tablet 650 mg (has no administration in time range)    Or  acetaminophen (TYLENOL) suppository 650 mg (has no administration in time range)  oxyCODONE (Oxy IR/ROXICODONE) immediate release tablet 5 mg (has no administration in time range)  ondansetron (ZOFRAN) tablet 4 mg (has no administration in time range)    Or  ondansetron (ZOFRAN) injection 4 mg (has no administration in time range)  sodium chloride flush (NS) 0.9 % injection 3 mL (3 mLs Intravenous Given 12/07/18 2043)  fentaNYL (SUBLIMAZE) injection 75 mcg (75 mcg Intravenous Given 12/07/18 2047)  sodium chloride 0.9 % bolus 1,000 mL (1,000 mLs Intravenous New Bag/Given 12/07/18 2046)  iohexol (OMNIPAQUE) 350 MG/ML injection 75 mL (75 mLs Intravenous Contrast Given 12/07/18 2050)    Mobility walks Low fall risk   Focused Assessments Cardiac Assessment Handoff:    Lab Results  Component Value Date   TROPONINI 0.03 ([place]) 12/07/2018   No results found for: DDIMER Does the Patient currently have chest pain? Yes     R Recommendations: See Admitting Provider Note  Report given to:   Additional Notes: none

## 2018-12-07 NOTE — ED Triage Notes (Signed)
Pt to ED via POV c/o right sided chest pain since this morning around 10 am. Pt states that he was seen last week and diagnosed with DVT, pt is currently taking Lovenox and Eliquis. Pt had surgery on Wednesday to have a tumor removed from his bladder. Pt denies shortness of breath. Pain is worse with movement. Pt is in NAD.

## 2018-12-08 ENCOUNTER — Inpatient Hospital Stay
Admit: 2018-12-08 | Discharge: 2018-12-08 | Disposition: A | Discharge status: Home / Self Care | Payer: Medicare Other | Attending: Internal Medicine | Admitting: Internal Medicine

## 2018-12-08 LAB — CBC
HCT: 45.1 % (ref 39.0–52.0)
Hemoglobin: 14.5 g/dL (ref 13.0–17.0)
MCH: 31.9 pg (ref 26.0–34.0)
MCHC: 32.2 g/dL (ref 30.0–36.0)
MCV: 99.1 fL (ref 80.0–100.0)
Platelets: 153 10*3/uL (ref 150–400)
RBC: 4.55 MIL/uL (ref 4.22–5.81)
RDW: 14.6 % (ref 11.5–15.5)
WBC: 8.1 10*3/uL (ref 4.0–10.5)
nRBC: 0 % (ref 0.0–0.2)

## 2018-12-08 LAB — BASIC METABOLIC PANEL
Anion gap: 6 (ref 5–15)
BUN: 25 mg/dL — ABNORMAL HIGH (ref 8–23)
CO2: 23 mmol/L (ref 22–32)
Calcium: 8.4 mg/dL — ABNORMAL LOW (ref 8.9–10.3)
Chloride: 109 mmol/L (ref 98–111)
Creatinine, Ser: 1.04 mg/dL (ref 0.61–1.24)
GFR calc Af Amer: >60 mL/min (ref >60)
Glucose, Bld: 152 mg/dL — ABNORMAL HIGH (ref 70–99)
Potassium: 4 mmol/L (ref 3.5–5.1)
Sodium: 138 mmol/L (ref 135–145)

## 2018-12-08 LAB — APTT
aPTT: 91 seconds — ABNORMAL HIGH (ref 24–36)
aPTT: 125 seconds — ABNORMAL HIGH (ref 24–36)

## 2018-12-08 LAB — HEPARIN LEVEL (UNFRACTIONATED): Heparin Unfractionated: >3.6 IU/mL — ABNORMAL HIGH (ref 0.30–0.70)

## 2018-12-08 MED ORDER — BENAZEPRIL HCL 20 MG PO TABS
20.0000 mg | ORAL_TABLET | Freq: Every day | ORAL | Status: DC
  Administered 2018-12-08 – 2018-12-09 (×2): 20 mg via ORAL
  Filled 2018-12-08 (×2): qty 1

## 2018-12-08 MED ORDER — HEPARIN (PORCINE) 25000 UT/250ML-% IV SOLN
1100.0000 [IU]/h | INTRAVENOUS | Status: DC
  Administered 2018-12-08: 22:00:00 1250 [IU]/h via INTRAVENOUS
  Administered 2018-12-08: 1350 [IU]/h via INTRAVENOUS
  Filled 2018-12-08 (×2): qty 250

## 2018-12-08 MED ORDER — MORPHINE SULFATE (PF) 2 MG/ML IV SOLN: 2.0000 mg | INTRAVENOUS | Status: DC | PRN

## 2018-12-08 MED ORDER — AMLODIPINE BESYLATE 5 MG PO TABS
5.0000 mg | ORAL_TABLET | Freq: Every day | ORAL | Status: DC
  Administered 2018-12-08 – 2018-12-09 (×2): 5 mg via ORAL
  Filled 2018-12-08 (×2): qty 1

## 2018-12-08 MED ORDER — POLYETHYLENE GLYCOL 3350 17 G PO PACK
17.0000 g | PACK | Freq: Every day | ORAL | Status: DC | PRN
  Administered 2018-12-08: 17 g via ORAL
  Filled 2018-12-08: qty 1

## 2018-12-08 NOTE — Progress Notes (Signed)
Received verbal order from Dr. Lorenso Courier (vascular) to discontinue NPO status and start a regular diet.

## 2018-12-08 NOTE — Consult Note (Addendum)
ANTICOAGULATION CONSULT NOTE - Initial Consult  Pharmacy Consult for heparin infusion dosing and monitoring  Indication: pulmonary embolus  Allergies  Allergen Reactions  . Doxazosin Other (See Comments)    Other reaction(s): Unknown   Patient Measurements: Height: 5\' 9"  (175.3 cm) Weight: 170 lb (77.1 kg) IBW/kg (Calculated) : 70.7  Vital Signs: Temp: 99.2 F (37.3 C) (03/21 0058) Temp Source: Oral (03/21 0058) BP: 147/95 (03/21 0058) Pulse Rate: 82 (03/21 0058)  Labs: Recent Labs    12/05/18 0924 12/07/18 1813 12/07/18 2035 12/08/18 0121  HGB  --  16.0  --  14.5  HCT  --  49.4  --  45.1  PLT  --  170  --  153  APTT  --  37*  --   --   LABPROT 13.1 14.6  --   --   INR 1.0 1.2  --   --   CREATININE  --  1.18  --  1.04  TROPONINI  --  0.03* 0.03*  --     Estimated Creatinine Clearance: 64.2 mL/min (by C-G formula based on SCr of 1.04 mg/dL).   Medical History: Past Medical History:  Diagnosis Date  . Arthritis   . Bladder cancer ([place])   . Bladder neck contracture   . Cataract   . DVT (deep venous thrombosis) ([place]) 11/25/2018   right le  . Dyslipidemia   . ED (erectile dysfunction)   . Frequency   . GERD (gastroesophageal reflux disease)   . Hypertension   . Incontinence of urine    sui, s/p cryoablation  . Kidney stones   . Nocturia   . Prostate cancer ([place])    S/P   CRYOABLATION  . Right elbow tendinitis   . Wears glasses     Assessment: Pharmacy consulted for heparin drip dosing and monitoring for 73 yo male admitted with Bilateral PE and PMH of DVT (Dx 2 weeks ago). Patient reports taking Lovenox and Eliquis prior to admission. Patient was off anticoagulation for a few days for a procedure, then restarted. Procedure was two days ago. Last dose Apixaban 10mg  and Lovenox 80mg  reported 3/20 @ ~ 2000. Per MD heparin infusion is to start @ 0400 on 3/21.    Goal of Therapy:  Heparin level 0.3-0.7 units/ml aPTT 66-102 seconds Monitor platelets by  anticoagulation protocol: Yes   Plan:  Heparin infusion to start @ 0400 per MD Start heparin infusion at 1350  Units/hr Will need to adjust heparin infusion by aPTT levels until aPTT and anti-Xa levels corollate.  Check aPTT level 8 hours after infusion starts and daily while on heparin Continue to monitor H&H and platelets  Pernell Dupre, PharmD, BCPS Clinical Pharmacist 12/08/2018 2:24 AM

## 2018-12-08 NOTE — Progress Notes (Signed)
Advanced care plan. Purpose of the Encounter: CODE STATUS Parties in Attendance: Patient Patient's Decision Capacity: Good Subjective/Patient's story: Phillip [name]  is a 73 y.o. male who presents with chief complaint as above.  Patient developed chest discomfort around 10:00 in the morning.  He was diagnosed with DVT about 2 weeks ago and started on anticoagulation.  He was also diagnosed with a bladder tumor.  He stopped his anticoagulation for 2 days prior to procedure to remove his bladder tumor.  This procedure was done 2 days ago.  Patient states that on the day of the procedure after it was done he developed significant right calf pain.  He restarted anticoagulation.  In the ED tonight he is shown on imaging to have bilateral pulmonary emboli.  Objective/Medical story Patient was worked up with CTA chest which showed a pulmonary embolism.  Patient started on heparin drip for anticoagulation.  Needs vascular surgery evaluation. Goals of care determination:  Advance care directives goals of care and treatment plan discussed.  Patient wants everything done which includes CPR, intubation ventilator if the need arises. CODE STATUS: Full code Time spent discussing advanced care planning: 16 minutes

## 2018-12-08 NOTE — Progress Notes (Signed)
[place] [facility] [place] NAME: Phillip [name]    MR#:  [id number]  DATE OF BIRTH:  Nov 06, 1945  SUBJECTIVE:  CHIEF COMPLAINT:   Chief Complaint  Patient presents with  . Chest Pain  Patient seen and evaluated today Has some chest discomfort on the right side Aggravated with deep breathing No complaints of shortness of breath  REVIEW OF SYSTEMS:    ROS  CONSTITUTIONAL: No documented fever. No fatigue, weakness. No weight gain, no weight loss.  EYES: No blurry or double vision.  ENT: No tinnitus. No postnasal drip. No redness of the oropharynx.  RESPIRATORY: No cough, no wheeze, no hemoptysis. No dyspnea.  CARDIOVASCULAR: Has chest pain. No orthopnea. No palpitations. No syncope.  GASTROINTESTINAL: No nausea, no vomiting or diarrhea. No abdominal pain. No melena or hematochezia.  GENITOURINARY: No dysuria or hematuria.  ENDOCRINE: No polyuria or nocturia. No heat or cold intolerance.  HEMATOLOGY: No anemia. No bruising. No bleeding.  INTEGUMENTARY: No rashes. No lesions.  MUSCULOSKELETAL: No arthritis. No swelling. No gout.  NEUROLOGIC: No numbness, tingling, or ataxia. No seizure-type activity.  PSYCHIATRIC: No anxiety. No insomnia. No ADD.   DRUG ALLERGIES:   Allergies  Allergen Reactions  . Doxazosin Other (See Comments)    Other reaction(s): Unknown    VITALS:  Blood pressure (!) 147/95, pulse 82, temperature 99.2 F (37.3 C), temperature source Oral, resp. rate 16, height 5\' 9"  (1.753 m), weight 77.1 kg, SpO2 95 %.  PHYSICAL EXAMINATION:   Physical Exam  GENERAL:  73 y.o.-year-old patient lying in the bed with no acute distress.  EYES: Pupils equal, round, reactive to light and accommodation. No scleral icterus. Extraocular muscles intact.  HEENT: Head atraumatic, normocephalic. Oropharynx and nasopharynx clear.  NECK:  Supple, no jugular venous distention. No thyroid enlargement, no tenderness.  LUNGS: Normal breath sounds  bilaterally, no wheezing, rales, rhonchi. No use of accessory muscles of respiration.  CARDIOVASCULAR: S1, S2 normal. No murmurs, rubs, or gallops.  ABDOMEN: Soft, nontender, nondistended. Bowel sounds present. No organomegaly or mass.  EXTREMITIES: No cyanosis, clubbing or edema b/l.    NEUROLOGIC: Cranial nerves II through XII are intact. No focal Motor or sensory deficits b/l.   PSYCHIATRIC: The patient is alert and oriented x 3.  SKIN: No obvious rash, lesion, or ulcer.   LABORATORY PANEL:   CBC Recent Labs  Lab 12/08/18 0121  WBC 8.1  HGB 14.5  HCT 45.1  PLT 153   ------------------------------------------------------------------------------------------------------------------ Chemistries  Recent Labs  Lab 12/08/18 0121  NA 138  K 4.0  CL 109  CO2 23  GLUCOSE 152*  BUN 25*  CREATININE 1.04  CALCIUM 8.4*   ------------------------------------------------------------------------------------------------------------------  Cardiac Enzymes Recent Labs  Lab 12/07/18 2035  TROPONINI 0.03*   ------------------------------------------------------------------------------------------------------------------  RADIOLOGY:  Ct Angio Chest Pe W And/or Wo Contrast  Result Date: 12/07/2018 CLINICAL DATA:  Right-sided chest pain. Recent diagnosis of DVT, currently on Lovenox and Eliquis. EXAM: CT ANGIOGRAPHY CHEST WITH CONTRAST TECHNIQUE: Multidetector CT imaging of the chest was performed using the standard protocol during bolus administration of intravenous contrast. Multiplanar CT image reconstructions and MIPs were obtained to evaluate the vascular anatomy. CONTRAST:  15mL OMNIPAQUE IOHEXOL 350 MG/ML SOLN COMPARISON:  None. FINDINGS: Cardiovascular: Satisfactory opacification of the pulmonary arteries to the segmental level. There are nonocclusive segmental and subsegmental pulmonary emboli within the right upper lobe, right middle lobe and right lower lobe pulmonary branches. The  heart is mildly enlarged. No definite evidence  of right heart strain. The RV/LV ratio is 0.9. The heart is mildly enlarged. There is no pericardial effusion. There are heavy calcific atherosclerotic calcifications of the coronary arteries. Mediastinum/Nodes: No enlarged mediastinal, hilar, or axillary lymph nodes. Thyroid gland, trachea, and esophagus demonstrate no significant findings. Lungs/Pleura: Hazy ground-glass opacities in the dependent portion of the right upper lobe and right lower lobe may represent areas of hypoperfusion versus atelectasis. Upper Abdomen: No acute abnormality. Musculoskeletal: No chest wall abnormality. No acute or significant osseous findings. Review of the MIP images confirms the above findings. IMPRESSION: 1. Nonocclusive segmental and subsegmental pulmonary emboli within the right upper lobe, right middle lobe and right lower lobe pulmonary branches. No evidence of right heart strain. 2. Mildly enlarged heart. 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 3. Hazy ground-glass opacities in the dependent portions of the right upper lobe and right lower lobe may represent areas of hypoperfusion versus atelectasis. Aortic Atherosclerosis (ICD10-I70.0). These results were called by telephone at the time of interpretation on 12/07/2018 at 9:15 pm to Dr. Eula Listen , who verbally acknowledged these results. Electronically Signed   By: Fidela Salisbury M.D.   On: 12/07/2018 21:17     ASSESSMENT AND PLAN:   73 year old male patient with a history of DVT 2 weeks ago started on anticoagulation, bladder cancer, hyperlipidemia, GERD, hypertension recently had surgery for bladder [facility] at [facility].  He was then put on anticoagulation with Lovenox subcutaneously and Eliquis.  He developed pain in the right leg and chest discomfort.  -Acute right lung pulmonary embolism Continue IV heparin for anticoagulation Status post vascular surgery  evaluation No acute intervention recommended  -Right leg DVT Currently on anticoagulation with heparin drip  -Right-sided chest pain secondary to pulmonary embolism Pain management  -Bladder cancer Status post TURBT on December 05, 2018 by urology Follow-up to continue  -GERD Continue proton pump inhibitor   All the records are reviewed and case discussed with Care Management/Social Worker. Management plans discussed with the patient, family and they are in agreement.  CODE STATUS: Full code  DVT Prophylaxis: SCDs  TOTAL TIME TAKING CARE OF THIS PATIENT: 35 minutes.   POSSIBLE D/C IN 2 to 3 DAYS, DEPENDING ON CLINICAL CONDITION.  Saundra Shelling M.D on 12/08/2018 at 8:31 AM  Between 7am to 6pm - Pager - [phone]  After 6pm go to [email or web address] - password [email or web address] [facility]  Office  [phone]  CC: Primary care physician; Rusty Aus, MD  Note: This dictation was prepared with Dragon dictation along with smaller phrase technology. Any transcriptional errors that result from this process are unintentional.

## 2018-12-08 NOTE — Progress Notes (Addendum)
ANTICOAGULATION CONSULT NOTE - Follow Up Consult  Pharmacy Consult for Heparin  Indication: pulmonary embolus  Allergies  Allergen Reactions  . Doxazosin Other (See Comments)    Other reaction(s): Unknown    Patient Measurements: Height: 5\' 9"  (175.3 cm) Weight: 170 lb (77.1 kg) IBW/kg (Calculated) : 70.7 Heparin Dosing Weight: 77.1 kg   Vital Signs: Temp: 98.2 F (36.8 C) (03/21 1555) Temp Source: Oral (03/21 1555) BP: 133/76 (03/21 1555) Pulse Rate: 73 (03/21 1555)  Labs: Recent Labs    12/07/18 1813 12/07/18 2035 12/08/18 0121 12/08/18 1024 12/08/18 1830  HGB 16.0  --  14.5  --   --   HCT 49.4  --  45.1  --   --   PLT 170  --  153  --   --   APTT 37*  --   --  91* 125*  LABPROT 14.6  --   --   --   --   INR 1.2  --   --   --   --   HEPARINUNFRC  --   --  >3.60*  --   --   CREATININE 1.18  --  1.04  --   --   TROPONINI 0.03* 0.03*  --   --   --     Estimated Creatinine Clearance: 64.2 mL/min (by C-G formula based on SCr of 1.04 mg/dL).   Medications:  Medications Prior to Admission  Medication Sig Dispense Refill Last Dose  . amLODipine (NORVASC) 5 MG tablet Take 5 mg by mouth daily.   12/07/2018 at Unknown time  . aspirin EC 81 MG tablet Take 81 mg by mouth daily.   Past Week at Unknown time  . benazepril (LOTENSIN) 20 MG tablet Take 20 mg by mouth daily.   12/07/2018 at Unknown time  . cephALEXin (KEFLEX) 500 MG capsule Take 1 capsule (500 mg total) by mouth 2 (two) times daily for 3 days. 6 capsule 0 12/07/2018 at Unknown time  . cetirizine (ZYRTEC) 10 MG tablet Take 10 mg by mouth 2 (two) times daily.    12/07/2018 at Unknown time  . cholecalciferol (VITAMIN D3) 25 MCG (1000 UT) tablet Take 1,000 Units by mouth daily.   12/07/2018 at Unknown time  . Chromium 1 MG CAPS Take 1 mg by mouth daily. 1 per day    12/07/2018 at Unknown time  . Eliquis DVT/PE Starter Pack (ELIQUIS STARTER PACK) 5 MG TABS Take as directed on package: start with two-5mg  tablets twice  daily for 7 days. On day 8, switch to one-5mg  tablet twice daily. (Patient taking differently: Take 5-10 mg by mouth See admin instructions. Take as directed on package: start with two-5mg  tablets twice daily for 7 days. On day 8, switch to one-5mg  tablet twice daily.) 1 each 0 12/07/2018 at 2000  . enoxaparin (LOVENOX) 80 MG/0.8ML injection Inject 80 mg into the skin 2 (two) times daily. To stop Sunday 12-02-2018 in pm for surgery 12-05-2018 per patient   12/07/2018 at 2000  . Glucosamine-Chondroit-Vit C-Mn (GLUCOSAMINE CHONDR 1500 COMPLX PO) Take 1 tablet by mouth daily.   12/07/2018 at Unknown time  . Multiple Vitamin (MULTIVITAMIN) tablet Take 1 tablet by mouth daily.   12/07/2018 at Unknown time  . naproxen sodium (ANAPROX) 220 MG tablet Take 220 mg by mouth 2 (two) times daily as needed (pain).    prn at prn  . niacin 500 MG tablet Take 500 mg by mouth at bedtime.   12/07/2018 at Unknown time  .  omeprazole (PRILOSEC) 40 MG capsule Take 40 mg by mouth daily.    12/07/2018 at Unknown time  . oxybutynin (DITROPAN) 5 MG tablet Take 1 tablet (5 mg total) by mouth every 8 (eight) hours as needed for bladder spasms. 30 tablet 1 prn at prn  . phenazopyridine (PYRIDIUM) 200 MG tablet Take 1 tablet (200 mg total) by mouth 3 (three) times daily as needed (for pain with urination). 30 tablet 0 prn at prn  . potassium phosphate, monobasic, (K-PHOS ORIGINAL) 500 MG tablet Take 500 mg by mouth daily.   12/07/2018 at Unknown time  . testosterone cypionate (DEPOTESTOSTERONE CYPIONATE) 200 MG/ML injection Inject 200 mg into the muscle every 14 (fourteen) days.    Past Month at Unknown time  . traMADol (ULTRAM) 50 MG tablet Take 1 tablet (50 mg total) by mouth every 6 (six) hours as needed for up to 3 days. 20 tablet 0 prn at prn  . TURMERIC PO Take 1 Dose by mouth daily.    12/07/2018 at Unknown time    Assessment: Pharmacy consulted for heparin drip dosing and monitoring for 73 yo male admitted with Bilateral PE and PMH  of DVT (Dx 2 weeks ago). Patient reports taking Lovenox and Eliquis prior to admission. Patient was off anticoagulation for a few days for a procedure, then restarted. Procedure was two days ago. Last dose Apixaban 10mg  and Lovenox 80mg  reported 3/20 @ ~ 2000. Per MD heparin infusion is to start @ 0400 on 3/21.   Heparin infusion to start @ 0400 per MD Start heparin infusion at 1350  Units/hr Will need to adjust heparin infusion by aPTT levels until aPTT and anti-Xa levels corollate.  Check aPTT level 8 hours after infusion starts and daily while on heparin   Goal of Therapy:  Heparin level 0.3-0.7 units/ml aPTT 66-102 seconds Monitor platelets by anticoagulation protocol: Yes   Plan:  3/21 @1024  aPTT=91. Will continue current drip rate of 1350 units/hr and check confirmatory level in 8 hours. HL level in am 3/22. Continue to monitor H&H and platelets  3/21:  APTT @ 18:30 = 125 Will decrease drip rate to 1250 units/hr and recheck aPTT 8 hrs after rate change.   Odysseus Cada D 12/08/2018,7:22 PM

## 2018-12-08 NOTE — Progress Notes (Signed)
*  PRELIMINARY RESULTS* Echocardiogram 2D Echocardiogram has been performed.  Phillip [name] Phillip [name] 12/08/2018, 3:43 PM

## 2018-12-08 NOTE — Consult Note (Signed)
[place] for heparin infusion dosing and monitoring  Indication: pulmonary embolus  Allergies  Allergen Reactions  . Doxazosin Other (See Comments)    Other reaction(s): Unknown   Patient Measurements: Height: 5\' 9"  (175.3 cm) Weight: 170 lb (77.1 kg) IBW/kg (Calculated) : 70.7  Vital Signs: Temp: 99.2 F (37.3 C) (03/21 0058) Temp Source: Oral (03/21 0058) BP: 147/95 (03/21 0058) Pulse Rate: 82 (03/21 0058)  Labs: Recent Labs    12/07/18 1813 12/07/18 2035 12/08/18 0121 12/08/18 1024  HGB 16.0  --  14.5  --   HCT 49.4  --  45.1  --   PLT 170  --  153  --   APTT 37*  --   --  91*  LABPROT 14.6  --   --   --   INR 1.2  --   --   --   HEPARINUNFRC  --   --  >3.60*  --   CREATININE 1.18  --  1.04  --   TROPONINI 0.03* 0.03*  --   --     Estimated Creatinine Clearance: 64.2 mL/min (by C-G formula based on SCr of 1.04 mg/dL).   Medical History: Past Medical History:  Diagnosis Date  . Arthritis   . Bladder cancer ([place])   . Bladder neck contracture   . Cataract   . DVT (deep venous thrombosis) ([place]) 11/25/2018   right le  . Dyslipidemia   . ED (erectile dysfunction)   . Frequency   . GERD (gastroesophageal reflux disease)   . Hypertension   . Incontinence of urine    sui, s/p cryoablation  . Kidney stones   . Nocturia   . Prostate cancer ([place])    S/P   CRYOABLATION  . Right elbow tendinitis   . Wears glasses     Assessment: Pharmacy consulted for heparin drip dosing and monitoring for 73 yo male admitted with Bilateral PE and PMH of DVT (Dx 2 weeks ago). Patient reports taking Lovenox and Eliquis prior to admission. Patient was off anticoagulation for a few days for a procedure, then restarted. Procedure was two days ago. Last dose Apixaban 10mg  and Lovenox 80mg  reported 3/20 @ ~ 2000. Per MD heparin infusion is to start @ 0400 on 3/21.   Heparin infusion to start @ 0400 per MD Start heparin infusion at 1350   Units/hr Will need to adjust heparin infusion by aPTT levels until aPTT and anti-Xa levels corollate.  Check aPTT level 8 hours after infusion starts and daily while on heparin   Goal of Therapy:  Heparin level 0.3-0.7 units/ml aPTT 66-102 seconds Monitor platelets by anticoagulation protocol: Yes   Plan:  3/21 @1024  aPTT=91. Will continue current drip rate of 1350 units/hr and check confirmatory level in 8 hours. HL level in am 3/22. Continue to monitor H&H and platelets  Noralee Space, PharmD, BCPS Clinical Pharmacist 12/08/2018 11:41 AM

## 2018-12-08 NOTE — Consult Note (Signed)
Reason for Consult:PE/ Right Popliteal DVT Referring Physician: Dr. Clifton Custard is an 73 y.o. male.  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 He was not on anticoagulation. He underwent bladder surgery 2 days ago and was placed on Lovenox and Eliquis 2 days preop for preventative measures. He states he awoke from surgery with right leg soreness and presented yesterday with chest pain. He found to have bilateral segmental Pulmonary emboli. No heart strain or respiratory compromise.  Past Medical History:  Diagnosis Date  . Arthritis   . Bladder cancer ([place])   . Bladder neck contracture   . Cataract   . DVT (deep venous thrombosis) ([place]) 11/25/2018   right le  . Dyslipidemia   . ED (erectile dysfunction)   . Frequency   . GERD (gastroesophageal reflux disease)   . Hypertension   . Incontinence of urine    sui, s/p cryoablation  . Kidney stones   . Nocturia   . Prostate cancer ([place])    S/P   CRYOABLATION  . Right elbow tendinitis   . Wears glasses     Past Surgical History:  Procedure Laterality Date  . ARTHROPLASTY AND TENDON REPAIR LEFT THUMB  JAN 2014  . COLONOSCOPY    . COLONOSCOPY WITH PROPOFOL N/A 08/09/2017   Procedure: COLONOSCOPY WITH PROPOFOL;  Surgeon: Manya Silvas, MD;  Location: [facility] ENDOSCOPY;  Service: Endoscopy;  Laterality: N/A;  . ESOPHAGOGASTRODUODENOSCOPY (EGD) WITH PROPOFOL N/A 08/09/2017   Procedure: ESOPHAGOGASTRODUODENOSCOPY (EGD) WITH PROPOFOL;  Surgeon: Manya Silvas, MD;  Location: [facility] ENDOSCOPY;  Service: Endoscopy;  Laterality: N/A;  . GREEN LIGHT LASER TURP (TRANSURETHRAL RESECTION OF PROSTATE N/A 12/14/2015   Procedure: GREEN LIGHT LASER TURP (TRANSURETHRAL RESECTION OF PROSTATE) LASER OF BLADDER NECK CONTRACTURE;  Surgeon: Carolan Clines, MD;  Location: [facility];  Service: Urology;  Laterality: N/A;  . PENILE  PROSTHESIS IMPLANT N/A 12/06/2013   Procedure: PENILE PROTHESIS INFLATABLE;  Surgeon: Ailene Rud, MD;  Location: [facility];  Service: Urology;  Laterality: N/A;  . PROSTATE CRYOABLATION  06-01-2012  [facility]  . TRANSURETHRAL RESECTION OF BLADDER NECK N/A 03/20/2015   Procedure: RELEASE BLADDER NECK CONTRACTURE  WITH WOLF BUTTON ELECTRODE ;  Surgeon: Carolan Clines, MD;  Location: [facility];  Service: Urology;  Laterality: N/A;  . TRANSURETHRAL RESECTION OF BLADDER TUMOR N/A 12/05/2018   Procedure: TRANSURETHRAL RESECTION OF BLADDER TUMOR (TURBT)/CYSTOSCOPY/  INSTILLATION OF Rodell Perna;  Surgeon: Ceasar Mons, MD;  Location: [facility];  Service: Urology;  Laterality: N/A;  . TRIGGER FINGER RELEASE Left 10/2015  . ulner nerve neuropathy      No family history on file.  Social History:  reports that he has never smoked. He has never used smokeless tobacco. He reports that he does not drink alcohol or use drugs.  Allergies:  Allergies  Allergen Reactions  . Doxazosin Other (See Comments)    Other reaction(s): Unknown    Medications: I have reviewed the patient's current medications.  Results for orders placed or performed during the hospital encounter of 12/07/18 (from the past 48 hour(s))  Basic metabolic panel     Status: Abnormal   Collection Time: 12/07/18  6:13 PM  Result Value Ref Range   Sodium 141 135 - 145 mmol/L   Potassium 4.8 3.5 - 5.1 mmol/L   Chloride 107 98 - 111 mmol/L  CO2 27 22 - 32 mmol/L   Glucose, Bld 111 (H) 70 - 99 mg/dL   BUN 29 (H) 8 - 23 mg/dL   Creatinine, Ser 1.18 0.61 - 1.24 mg/dL   Calcium 9.0 8.9 - 10.3 mg/dL   GFR calc non Af Amer >60 >60 mL/min   GFR calc Af Amer >60 >60 mL/min   Anion gap 7 5 - 15    Comment: Performed at [facility], [address] [place], [place] [address]  CBC     Status: None   Collection Time: 12/07/18  6:13 PM  Result Value Ref Range    WBC 8.4 4.0 - 10.5 K/uL   RBC 4.95 4.22 - 5.81 MIL/uL   Hemoglobin 16.0 13.0 - 17.0 g/dL   HCT 49.4 39.0 - 52.0 %   MCV 99.8 80.0 - 100.0 fL   MCH 32.3 26.0 - 34.0 pg   MCHC 32.4 30.0 - 36.0 g/dL   RDW 14.6 11.5 - 15.5 %   Platelets 170 150 - 400 K/uL   nRBC 0.0 0.0 - 0.2 %    Comment: Performed at [facility], [address] [place], [place] [address]  Troponin I - ONCE - STAT     Status: Abnormal   Collection Time: 12/07/18  6:13 PM  Result Value Ref Range   Troponin I 0.03 (HH) <0.03 ng/mL    Comment: CRITICAL RESULT CALLED TO, READ BACK BY AND VERIFIED WITH BRANDY DAVIS 12/07/18 @ [place] Performed at [facility], [address] [place], [place] [address]   Protime-INR     Status: None   Collection Time: 12/07/18  6:13 PM  Result Value Ref Range   Prothrombin Time 14.6 11.4 - 15.2 seconds   INR 1.2 0.8 - 1.2    Comment: (NOTE) INR goal varies based on device and disease states. Performed at [facility], [address] [place], [place] [address]   APTT     Status: Abnormal   Collection Time: 12/07/18  6:13 PM  Result Value Ref Range   aPTT 37 (H) 24 - 36 seconds    Comment:        IF BASELINE aPTT IS ELEVATED, SUGGEST PATIENT RISK ASSESSMENT BE USED TO DETERMINE APPROPRIATE ANTICOAGULANT THERAPY. Performed at [facility], [address] [place], [place] [address]   Troponin I - ONCE - STAT     Status: Abnormal   Collection Time: 12/07/18  8:35 PM  Result Value Ref Range   Troponin I 0.03 (HH) <0.03 ng/mL    Comment: CRITICAL VALUE NOTED. VALUE IS CONSISTENT WITH PREVIOUSLY REPORTED/CALLED VALUE  / [place] Performed at [facility], [address] [place], [place] [address]   Basic metabolic panel     Status: Abnormal   Collection Time: 12/08/18  1:21 AM  Result Value Ref Range   Sodium 138 135 - 145 mmol/L   Potassium 4.0 3.5 - 5.1 mmol/L   Chloride 109 98 - 111 mmol/L   CO2 23 22 - 32 mmol/L   Glucose,  Bld 152 (H) 70 - 99 mg/dL   BUN 25 (H) 8 - 23 mg/dL   Creatinine, Ser 1.04 0.61 - 1.24 mg/dL   Calcium 8.4 (L) 8.9 - 10.3 mg/dL   GFR calc non Af Amer >60 >60 mL/min   GFR calc Af Amer >60 >60 mL/min   Anion gap 6 5 - 15    Comment: Performed at [facility], [address] [place], [place] [address]  CBC     Status: None   Collection Time: 12/08/18  1:21 AM  Result Value Ref Range   WBC 8.1 4.0 - 10.5 K/uL   RBC 4.55 4.22 - 5.81 MIL/uL   Hemoglobin 14.5 13.0 - 17.0 g/dL   HCT 45.1 39.0 - 52.0 %   MCV 99.1 80.0 - 100.0 fL   MCH 31.9 26.0 - 34.0 pg   MCHC 32.2 30.0 - 36.0 g/dL   RDW 14.6 11.5 - 15.5 %   Platelets 153 150 - 400 K/uL   nRBC 0.0 0.0 - 0.2 %    Comment: Performed at [facility], [address] [place], [place] [address]  Heparin level (unfractionated)     Status: Abnormal   Collection Time: 12/08/18  1:21 AM  Result Value Ref Range   Heparin Unfractionated >3.60 (H) 0.30 - 0.70 IU/mL    Comment: RESULTS CONFIRMED BY MANUAL DILUTION (NOTE) If heparin results are below expected values, and patient dosage has  been confirmed, suggest follow up testing of antithrombin III levels. Performed at [facility], [place], [place] [address]     Ct Angio Chest Pe W And/or Wo Contrast  Result Date: 12/07/2018 CLINICAL DATA:  Right-sided chest pain. Recent diagnosis of DVT, currently on Lovenox and Eliquis. EXAM: CT ANGIOGRAPHY CHEST WITH CONTRAST TECHNIQUE: Multidetector CT imaging of the chest was performed using the standard protocol during bolus administration of intravenous contrast. Multiplanar CT image reconstructions and MIPs were obtained to evaluate the vascular anatomy. CONTRAST:  36mL OMNIPAQUE IOHEXOL 350 MG/ML SOLN COMPARISON:  None. FINDINGS: Cardiovascular: Satisfactory opacification of the pulmonary arteries to the segmental level. There are nonocclusive segmental and subsegmental pulmonary emboli within the right  upper lobe, right middle lobe and right lower lobe pulmonary branches. The heart is mildly enlarged. No definite evidence of right heart strain. The RV/LV ratio is 0.9. The heart is mildly enlarged. There is no pericardial effusion. There are heavy calcific atherosclerotic calcifications of the coronary arteries. Mediastinum/Nodes: No enlarged mediastinal, hilar, or axillary lymph nodes. Thyroid gland, trachea, and esophagus demonstrate no significant findings. Lungs/Pleura: Hazy ground-glass opacities in the dependent portion of the right upper lobe and right lower lobe may represent areas of hypoperfusion versus atelectasis. Upper Abdomen: No acute abnormality. Musculoskeletal: No chest wall abnormality. No acute or significant osseous findings. Review of the MIP images confirms the above findings. IMPRESSION: 1. Nonocclusive segmental and subsegmental pulmonary emboli within the right upper lobe, right middle lobe and right lower lobe pulmonary branches. No evidence of right heart strain. 2. Mildly enlarged heart. 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 3. Hazy ground-glass opacities in the dependent portions of the right upper lobe and right lower lobe may represent areas of hypoperfusion versus atelectasis. Aortic Atherosclerosis (ICD10-I70.0). These results were called by telephone at the time of interpretation on 12/07/2018 at 9:15 pm to Dr. Eula Listen , who verbally acknowledged these results. Electronically Signed   By: Fidela Salisbury M.D.   On: 12/07/2018 21:17    Review of Systems  Constitutional: Negative for chills and fever.  Eyes: Negative for blurred vision.  Respiratory: Negative for cough.   Cardiovascular: Positive for chest pain. Negative for palpitations, claudication and leg swelling.  Gastrointestinal: Negative for abdominal pain and vomiting.  Musculoskeletal:       Right leg soreness  Skin: Negative.   Neurological: Negative for dizziness,  sensory change, speech change and focal weakness.  Blood pressure (!) 147/95, pulse 82, temperature 99.2 F (37.3 C), temperature source Oral, resp. rate 16, height 5\' 9"  (1.753 m), weight 77.1 kg, SpO2 95 %. Physical Exam  Nursing note and vitals reviewed. Constitutional: He is oriented to person, place, and time. He appears well-developed and well-nourished.  HENT:  Head: Normocephalic.  Neck: Normal range of motion. Neck supple.  Cardiovascular: Normal rate, regular rhythm and intact distal pulses.  No murmur heard. Respiratory: Effort normal and breath sounds normal. No respiratory distress. He has no wheezes. He has no rales.  GI: Soft. He exhibits no distension. There is no abdominal tenderness.  Musculoskeletal:        General: No edema.  Neurological: He is alert and oriented to person, place, and time.  Skin: Skin is warm and dry.    Assessment/Plan: Right lower extremity DVT, bilateral segmental pulmonary emboli without heart strain or respiratory compromise. As the patient had not been anticoagulated fully for his popliteal DVT 11/25/18 the PE likely resulted. At this juncture. No Vascular Surgery intervention recommended. Full anticoagulation with Heparin gtt, supportive care. Once patient improves symptomatically would transition to Coumadin for 3 months then to a novel anticoagulant.  Joziyah Roblero A 12/08/2018, 7:29 AM

## 2018-12-08 NOTE — Plan of Care (Signed)

## 2018-12-09 DIAGNOSIS — C679 Malignant neoplasm of bladder, unspecified: Secondary | ICD-10-CM

## 2018-12-09 DIAGNOSIS — I2699 Other pulmonary embolism without acute cor pulmonale: Principal | ICD-10-CM

## 2018-12-09 DIAGNOSIS — I82409 Acute embolism and thrombosis of unspecified deep veins of unspecified lower extremity: Secondary | ICD-10-CM

## 2018-12-09 DIAGNOSIS — I82461 Acute embolism and thrombosis of right calf muscular vein: Secondary | ICD-10-CM

## 2018-12-09 LAB — CBC
HCT: 42.9 % (ref 39.0–52.0)
Hemoglobin: 13.8 g/dL (ref 13.0–17.0)
MCH: 31.9 pg (ref 26.0–34.0)
MCHC: 32.2 g/dL (ref 30.0–36.0)
MCV: 99.1 fL (ref 80.0–100.0)
NRBC: 0 % (ref 0.0–0.2)
Platelets: 121 10*3/uL — ABNORMAL LOW (ref 150–400)
RBC: 4.33 MIL/uL (ref 4.22–5.81)
RDW: 14.5 % (ref 11.5–15.5)
WBC: 6.2 10*3/uL (ref 4.0–10.5)

## 2018-12-09 LAB — APTT
APTT: 145 s — AB (ref 24–36)
aPTT: 100 seconds — ABNORMAL HIGH (ref 24–36)

## 2018-12-09 LAB — ECHOCARDIOGRAM COMPLETE
Height: 69 in
Weight: 2720 oz

## 2018-12-09 LAB — HEPARIN LEVEL (UNFRACTIONATED): Heparin Unfractionated: 1.39 IU/mL — ABNORMAL HIGH (ref 0.30–0.70)

## 2018-12-09 MED ORDER — ENOXAPARIN SODIUM 80 MG/0.8ML ~~LOC~~ SOLN: 80.0000 mg | Freq: Two times a day (BID) | SUBCUTANEOUS | 0 refills | Status: DC

## 2018-12-09 NOTE — Progress Notes (Addendum)
ANTICOAGULATION CONSULT NOTE - Follow Up Consult  Pharmacy Consult for Heparin  Indication: pulmonary embolus  Allergies  Allergen Reactions  . Doxazosin Other (See Comments)    Other reaction(s): Unknown    Patient Measurements: Height: 5\' 9"  (175.3 cm) Weight: 170 lb (77.1 kg) IBW/kg (Calculated) : 70.7 Heparin Dosing Weight: 77.1 kg   Vital Signs: Temp: 98.9 F (37.2 C) (03/21 2034) Temp Source: Oral (03/21 2034) BP: 132/85 (03/21 2034) Pulse Rate: 67 (03/21 2034)  Labs: Recent Labs    12/07/18 1813 12/07/18 2035 12/08/18 0121 12/08/18 1024 12/08/18 1830 12/09/18 0407  HGB 16.0  --  14.5  --   --   --   HCT 49.4  --  45.1  --   --   --   PLT 170  --  153  --   --   --   APTT 37*  --   --  91* 125* 145*  LABPROT 14.6  --   --   --   --   --   INR 1.2  --   --   --   --   --   HEPARINUNFRC  --   --  >3.60*  --   --   --   CREATININE 1.18  --  1.04  --   --   --   TROPONINI 0.03* 0.03*  --   --   --   --     Estimated Creatinine Clearance: 64.2 mL/min (by C-G formula based on SCr of 1.04 mg/dL).   Medications:  Medications Prior to Admission  Medication Sig Dispense Refill Last Dose  . amLODipine (NORVASC) 5 MG tablet Take 5 mg by mouth daily.   12/07/2018 at Unknown time  . aspirin EC 81 MG tablet Take 81 mg by mouth daily.   Past Week at Unknown time  . benazepril (LOTENSIN) 20 MG tablet Take 20 mg by mouth daily.   12/07/2018 at Unknown time  . [EXPIRED] cephALEXin (KEFLEX) 500 MG capsule Take 1 capsule (500 mg total) by mouth 2 (two) times daily for 3 days. 6 capsule 0 12/07/2018 at Unknown time  . cetirizine (ZYRTEC) 10 MG tablet Take 10 mg by mouth 2 (two) times daily.    12/07/2018 at Unknown time  . cholecalciferol (VITAMIN D3) 25 MCG (1000 UT) tablet Take 1,000 Units by mouth daily.   12/07/2018 at Unknown time  . Chromium 1 MG CAPS Take 1 mg by mouth daily. 1 per day    12/07/2018 at Unknown time  . Eliquis DVT/PE Starter Pack (ELIQUIS STARTER PACK) 5 MG  TABS Take as directed on package: start with two-5mg  tablets twice daily for 7 days. On day 8, switch to one-5mg  tablet twice daily. (Patient taking differently: Take 5-10 mg by mouth See admin instructions. Take as directed on package: start with two-5mg  tablets twice daily for 7 days. On day 8, switch to one-5mg  tablet twice daily.) 1 each 0 12/07/2018 at 2000  . enoxaparin (LOVENOX) 80 MG/0.8ML injection Inject 80 mg into the skin 2 (two) times daily. To stop Sunday 12-02-2018 in pm for surgery 12-05-2018 per patient   12/07/2018 at 2000  . Glucosamine-Chondroit-Vit C-Mn (GLUCOSAMINE CHONDR 1500 COMPLX PO) Take 1 tablet by mouth daily.   12/07/2018 at Unknown time  . Multiple Vitamin (MULTIVITAMIN) tablet Take 1 tablet by mouth daily.   12/07/2018 at Unknown time  . naproxen sodium (ANAPROX) 220 MG tablet Take 220 mg by mouth 2 (two) times daily as  needed (pain).    prn at prn  . niacin 500 MG tablet Take 500 mg by mouth at bedtime.   12/07/2018 at Unknown time  . omeprazole (PRILOSEC) 40 MG capsule Take 40 mg by mouth daily.    12/07/2018 at Unknown time  . oxybutynin (DITROPAN) 5 MG tablet Take 1 tablet (5 mg total) by mouth every 8 (eight) hours as needed for bladder spasms. 30 tablet 1 prn at prn  . phenazopyridine (PYRIDIUM) 200 MG tablet Take 1 tablet (200 mg total) by mouth 3 (three) times daily as needed (for pain with urination). 30 tablet 0 prn at prn  . potassium phosphate, monobasic, (K-PHOS ORIGINAL) 500 MG tablet Take 500 mg by mouth daily.   12/07/2018 at Unknown time  . testosterone cypionate (DEPOTESTOSTERONE CYPIONATE) 200 MG/ML injection Inject 200 mg into the muscle every 14 (fourteen) days.    Past Month at Unknown time  . [EXPIRED] traMADol (ULTRAM) 50 MG tablet Take 1 tablet (50 mg total) by mouth every 6 (six) hours as needed for up to 3 days. 20 tablet 0 prn at prn  . TURMERIC PO Take 1 Dose by mouth daily.    12/07/2018 at Unknown time    Assessment: Pharmacy consulted for heparin  drip dosing and monitoring for 73 yo male admitted with Bilateral PE and PMH of DVT (Dx 2 weeks ago). Patient reports taking Lovenox and Eliquis prior to admission. Patient was off anticoagulation for a few days for a procedure, then restarted. Procedure was two days ago. Last dose Apixaban 10mg  and Lovenox 80mg  reported 3/20 @ ~ 2000. Per MD heparin infusion is to start @ 0400 on 3/21.   3/21 Start heparin infusion at 1350  Units/hr 3/21 @1024  aPTT 91. 3/21 @ 1830 aPTT 125 - Infusion decreased to 1250 units/hr 3/22 @ 0407 aPTT 145    Goal of Therapy:  Heparin level 0.3-0.7 units/ml aPTT 66-102 seconds Monitor platelets by anticoagulation protocol: Yes   Plan:  3/22 @ 0407 aPTT 145. Will decrease infusion rate to heparin 1100 units/hr. Will recheck aPTT in 8 hours. Plts down from 153 to 121.  Continue to order CBC and anti-Xa levels daily while on heparin. Continue to monitor H&H and Platelets.   Pernell Dupre, PharmD, BCPS Clinical Pharmacist 12/09/2018 4:53 AM

## 2018-12-09 NOTE — Consult Note (Signed)
Hematology/Oncology Consult note [facility] Telephone:([phone] [phone]  Patient Care Team: Rusty Aus, MD as PCP - General (Internal Medicine)   Name of the patient: Phillip [name]  [id number]  1946-01-22   Date of visit: 12/09/18 REASON FOR COSULTATION:  Anticoagulation, developed subsequent pulmonary emboli History of presenting illness-  73 y.o. male with PMH listed at below who presents to ER for evaluation of chest pain, located on the right side, started a few hours prior to the presentation.  Worsened with positional changes and deep breaths.  Denies any shortness of breath. He was recently diagnosed with acute right lower extremity DVT-focal nonocclusive thrombus demonstrated in the right popliteal vein on 11/25/2018.  Patient was started on Lovenox 80 mg twice daily. Patient also has pending urology surgery at that time with Dr. 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 Patient was on Lovenox 80 mg twice daily for about a week, anticoagulation was held for 2 days and the patient underwent TURBT and intravesical instillation of gemcitabine on 12/05/2018. 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  Muscularis propria is present and not involved. Patient reports that he resumed anticoagulation with Lovenox 24 hours after procedure.  Also he was instructed to start Eliquis 10 mg twice daily.  However developed pain on 12/07/2018 and presented emergency room for evaluation of chest pain. CT chest angiogram showed nonocclusive segmental and subsegmental pulmonary emboli within the right upper lobe, right middle lobe, right lower lobe pulmonary branches.  No evidence of right heart strain.  Mildly enlarged heart. Atherosclerotic disease of the coronary artery disease.  Hazy groundglass opacities in the dependent portion of the right upper lobe and the right lower lobe may represent an area of  hypoperfusion versus atelectasis.  Patient was admitted to hospital.  IV heparin drip was started.  Vascular surgery recommended no intervention. Patient reports feeling better today.  Wife at bedside. He also reports a history of superficial venous thrombosis demonstrated in the greater saphenous vein-01/03/2016.  Also history of superficial thrombophlebitis of segment  of left the basal leg vein in the upper arm-08/24/2017.  No anticoagulation was recommended at that time due to the SVT features. Denies any family history of blood clots except he mentioned his son have some clotting problems. Denies any family history of cancer. He has a history of prostate cancer treated with cryo therapy in 2013. Also has hypogonadism.  He is on testosterone shots, monitored by urology.   Review of Systems  Constitutional: Positive for fatigue. Negative for appetite change, chills, fever and unexpected weight change.  HENT:   Negative for hearing loss and voice change.   Eyes: Negative for eye problems and icterus.  Respiratory: Negative for cough and shortness of breath.   Cardiovascular: Positive for chest pain. Negative for leg swelling.  Gastrointestinal: Negative for abdominal distention and abdominal pain.  Endocrine: Negative for hot flashes.  Genitourinary: Negative for difficulty urinating, dysuria and frequency.   Musculoskeletal: Negative for arthralgias.       Right lower extremity pain  Skin: Negative for itching and rash.  Neurological: Negative for light-headedness and numbness.  Hematological: Negative for adenopathy. Does not bruise/bleed easily.  Psychiatric/Behavioral: Negative for confusion.    Allergies  Allergen Reactions   Doxazosin Other (See Comments)    Other reaction(s): Unknown    Patient Active Problem List   Diagnosis Date Noted   Pulmonary emboli ([place]) 12/07/2018   GERD (gastroesophageal reflux disease) 12/07/2018   HTN (  hypertension) 12/07/2018   HLD  (hyperlipidemia) 12/07/2018   Prostate cancer ([place]) 12/07/2018   Bladder cancer ([place]) 12/07/2018     Past Medical History:  Diagnosis Date   Arthritis    Bladder cancer [facility])    Bladder neck contracture    Cataract    DVT (deep venous thrombosis) ([place]) 11/25/2018   right le   Dyslipidemia    ED (erectile dysfunction)    Frequency    GERD (gastroesophageal reflux disease)    Hypertension    Incontinence of urine    sui, s/p cryoablation   Kidney stones    Nocturia    Prostate cancer ([place])    S/P   CRYOABLATION   Right elbow tendinitis    Wears glasses      Past Surgical History:  Procedure Laterality Date   ARTHROPLASTY AND TENDON REPAIR LEFT THUMB  JAN 2014   COLONOSCOPY     COLONOSCOPY WITH PROPOFOL N/A 08/09/2017   Procedure: COLONOSCOPY WITH PROPOFOL;  Surgeon: Manya Silvas, MD;  Location: [facility] ENDOSCOPY;  Service: Endoscopy;  Laterality: N/A;   ESOPHAGOGASTRODUODENOSCOPY (EGD) WITH PROPOFOL N/A 08/09/2017   Procedure: ESOPHAGOGASTRODUODENOSCOPY (EGD) WITH PROPOFOL;  Surgeon: Manya Silvas, MD;  Location: [facility] ENDOSCOPY;  Service: Endoscopy;  Laterality: N/A;   GREEN LIGHT LASER TURP (TRANSURETHRAL RESECTION OF PROSTATE N/A 12/14/2015   Procedure: GREEN LIGHT LASER TURP (TRANSURETHRAL RESECTION OF PROSTATE) LASER OF BLADDER NECK CONTRACTURE;  Surgeon: Carolan Clines, MD;  Location: [place];  Service: Urology;  Laterality: N/A;   PENILE PROSTHESIS IMPLANT N/A 12/06/2013   Procedure: PENILE PROTHESIS INFLATABLE;  Surgeon: Ailene Rud, MD;  Location: [facility];  Service: Urology;  Laterality: N/A;   PROSTATE CRYOABLATION  06-01-2012  [facility]   TRANSURETHRAL RESECTION OF BLADDER NECK N/A 03/20/2015   Procedure: RELEASE BLADDER NECK CONTRACTURE  WITH WOLF BUTTON ELECTRODE ;  Surgeon: Carolan Clines, MD;  Location: [place];  Service: Urology;  Laterality: N/A;   TRANSURETHRAL  RESECTION OF BLADDER TUMOR N/A 12/05/2018   Procedure: TRANSURETHRAL RESECTION OF BLADDER TUMOR (TURBT)/CYSTOSCOPY/  INSTILLATION OF Rodell Perna;  Surgeon: Ceasar Mons, MD;  Location: [facility];  Service: Urology;  Laterality: N/A;   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      Social History   Socioeconomic History   Marital status: Married    Spouse name: Not on file   Number of children: Not on file   Years of education: Not on file   Highest education level: Not on file  Occupational History   Not on file  Social Needs   Financial resource strain: Not on file   Food insecurity:    Worry: Not on file    Inability: Not on file   Transportation needs:    Medical: Not on file    Non-medical: Not on file  Tobacco Use   Smoking status: Never Smoker   Smokeless tobacco: Never Used  Substance and Sexual Activity   Alcohol use: No   Drug use: No   Sexual activity: Not on file  Lifestyle   Physical activity:    Days per week: Not on file    Minutes per session: Not on file   Stress: Not on file  Relationships   Social connections:    Talks on phone: Not on file    Gets together: Not on file    Attends religious service: Not on file    Active member  of club or organization: Not on file    Attends meetings of clubs or organizations: Not on file    Relationship status: Not on file   Intimate partner violence:    Fear of current or ex partner: Not on file    Emotionally abused: Not on file    Physically abused: Not on file    Forced sexual activity: Not on file  Other Topics Concern   Not on file  Social History Narrative   Not on file     No family history on file.   Current Facility-Administered Medications:    acetaminophen (TYLENOL) tablet 650 mg, 650 mg, Oral, Q6H PRN **OR** acetaminophen (TYLENOL) suppository 650 mg, 650 mg, Rectal, Q6H PRN, Lance Coon, MD   amLODipine (NORVASC)  tablet 5 mg, 5 mg, Oral, Daily, Pyreddy, Pavan, MD, 5 mg at 12/09/18 0949   benazepril (LOTENSIN) tablet 20 mg, 20 mg, Oral, Daily, Pyreddy, Pavan, MD, 20 mg at 12/09/18 0949   heparin ADULT infusion 100 units/mL (25000 units/284mL sodium chloride 0.45%), 1,100 Units/hr, Intravenous, Continuous, Hallaji, Sheema M, RPH, Last Rate: 11 mL/hr at 12/09/18 0502, 1,100 Units/hr at 12/09/18 0502   morphine 2 MG/ML injection 2 mg, 2 mg, Intravenous, Q4H PRN, Lance Coon, MD   niacin tablet 500 mg, 500 mg, Oral, QHS, Lance Coon, MD, 500 mg at 12/08/18 2225   ondansetron (ZOFRAN) tablet 4 mg, 4 mg, Oral, Q6H PRN **OR** ondansetron (ZOFRAN) injection 4 mg, 4 mg, Intravenous, Q6H PRN, Lance Coon, MD   oxyCODONE (Oxy IR/ROXICODONE) immediate release tablet 5 mg, 5 mg, Oral, Q4H PRN, Lance Coon, MD, 5 mg at 12/08/18 1626   pantoprazole (PROTONIX) EC tablet 40 mg, 40 mg, Oral, Daily, Lance Coon, MD, 40 mg at 12/09/18 0949   polyethylene glycol (MIRALAX / GLYCOLAX) packet 17 g, 17 g, Oral, Daily PRN, Lance Coon, MD, 17 g at 12/08/18 2213   Physical exam:  Vitals:   12/08/18 1555 12/08/18 2034 12/09/18 0607 12/09/18 1242  BP: 133/76 132/85 119/73 129/75  Pulse: 73 67 (!) 57 70  Resp: 18 20 16    Temp: 98.2 F (36.8 C) 98.9 F (37.2 C) 98.7 F (37.1 C) 97.8 F (36.6 C)  TempSrc: Oral Oral  Oral  SpO2: 94% 94% 96% 95%  Weight:      Height:       Physical Exam  Constitutional: He is oriented to person, place, and time. No distress.  HENT:  Head: Normocephalic and atraumatic.  Nose: Nose normal.  Mouth/Throat: Oropharynx is clear and moist. No oropharyngeal exudate.  Eyes: Pupils are equal, round, and reactive to light. EOM are normal. No scleral icterus.  Neck: Normal range of motion. Neck supple.  Cardiovascular: Normal rate and regular rhythm.  No murmur heard. Pulmonary/Chest: Effort normal and breath sounds normal. No respiratory distress. He has no rales. He exhibits no  tenderness.  Abdominal: Soft. He exhibits no distension. There is no abdominal tenderness.  Musculoskeletal: Normal range of motion.     Comments: Right lower extremity calf tenderness. No significant swelling.   Neurological: He is alert and oriented to person, place, and time. No cranial nerve deficit. He exhibits normal muscle tone. Coordination normal.  Skin: Skin is warm and dry. He is not diaphoretic. No erythema.  Psychiatric: Affect normal.        CMP Latest Ref Rng & Units 12/08/2018  Glucose 70 - 99 mg/dL 152(H)  BUN 8 - 23 mg/dL 25(H)  Creatinine 0.61 - 1.24 mg/dL 1.04  Sodium 135 - 145 mmol/L 138  Potassium 3.5 - 5.1 mmol/L 4.0  Chloride 98 - 111 mmol/L 109  CO2 22 - 32 mmol/L 23  Calcium 8.9 - 10.3 mg/dL 8.4(L)  Total Protein 6.4 - 8.2 g/dL -  Total Bilirubin 0.2 - 1.0 mg/dL -  Alkaline Phos 46 - 116 Unit/L -  AST 15 - 37 Unit/L -  ALT 14 - 63 U/L -   CBC Latest Ref Rng & Units 12/09/2018  WBC 4.0 - 10.5 K/uL 6.2  Hemoglobin 13.0 - 17.0 g/dL 13.8  Hematocrit 39.0 - 52.0 % 42.9  Platelets 150 - 400 K/uL 121(L)   RADIOGRAPHIC STUDIES: I have personally reviewed the radiological images as listed and agreed with the findings in the report.  Ct Angio Chest Pe W And/or Wo Contrast  Result Date: 12/07/2018 CLINICAL DATA:  Right-sided chest pain. Recent diagnosis of DVT, currently on Lovenox and Eliquis. EXAM: CT ANGIOGRAPHY CHEST WITH CONTRAST TECHNIQUE: Multidetector CT imaging of the chest was performed using the standard protocol during bolus administration of intravenous contrast. Multiplanar CT image reconstructions and MIPs were obtained to evaluate the vascular anatomy. CONTRAST:  39mL OMNIPAQUE IOHEXOL 350 MG/ML SOLN COMPARISON:  None. FINDINGS: Cardiovascular: Satisfactory opacification of the pulmonary arteries to the segmental level. There are nonocclusive segmental and subsegmental pulmonary emboli within the right upper lobe, right middle lobe and right lower  lobe pulmonary branches. The heart is mildly enlarged. No definite evidence of right heart strain. The RV/LV ratio is 0.9. The heart is mildly enlarged. There is no pericardial effusion. There are heavy calcific atherosclerotic calcifications of the coronary arteries. Mediastinum/Nodes: No enlarged mediastinal, hilar, or axillary lymph nodes. Thyroid gland, trachea, and esophagus demonstrate no significant findings. Lungs/Pleura: Hazy ground-glass opacities in the dependent portion of the right upper lobe and right lower lobe may represent areas of hypoperfusion versus atelectasis. Upper Abdomen: No acute abnormality. Musculoskeletal: No chest wall abnormality. No acute or significant osseous findings. Review of the MIP images confirms the above findings. IMPRESSION: 1. Nonocclusive segmental and subsegmental pulmonary emboli within the right upper lobe, right middle lobe and right lower lobe pulmonary branches. No evidence of right heart strain. 2. Mildly enlarged heart. 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 3. Hazy ground-glass opacities in the dependent portions of the right upper lobe and right lower lobe may represent areas of hypoperfusion versus atelectasis. Aortic Atherosclerosis (ICD10-I70.0). These results were called by telephone at the time of interpretation on 12/07/2018 at 9:15 pm to Dr. Eula Listen , who verbally acknowledged these results. Electronically Signed   By: Fidela Salisbury M.D.   On: 12/07/2018 21:17   US Venous Img Lower Unilateral Right  Result Date: 11/25/2018 CLINICAL DATA:  Pain and tenderness. History of greater saphenous vein thrombus and 2017. EXAM: Right LOWER EXTREMITY VENOUS DOPPLER ULTRASOUND TECHNIQUE: Gray-scale sonography with graded compression, as well as color Doppler and duplex ultrasound were performed to evaluate the lower extremity deep venous systems from the level of the common femoral vein and including the common femoral,  femoral, profunda femoral, popliteal and calf veins including the posterior tibial, peroneal and gastrocnemius veins when visible. The superficial great saphenous vein was also interrogated. Spectral Doppler was utilized to evaluate flow at rest and with distal augmentation maneuvers in the common femoral, femoral and popliteal veins. COMPARISON:  01/03/2016 FINDINGS: Contralateral Common Femoral Vein: Respiratory phasicity is normal and symmetric with the symptomatic side. No evidence of thrombus. Normal compressibility. Common Femoral Vein: No evidence  of thrombus. Normal compressibility, respiratory phasicity and response to augmentation. Saphenofemoral Junction: No evidence of thrombus. Normal compressibility and flow on color Doppler imaging. Profunda Femoral Vein: No evidence of thrombus. Normal compressibility and flow on color Doppler imaging. Femoral Vein: No evidence of thrombus. Normal compressibility, respiratory phasicity and response to augmentation. Popliteal Vein: Eccentric nonocclusive thrombus is demonstrated within the right popliteal vein. Flow is shown around the area of thrombus. Calf Veins: No evidence of thrombus. Normal compressibility and flow on color Doppler imaging. Superficial Great Saphenous Vein: No evidence of thrombus. Normal compressibility. Venous Reflux:  None. Other Findings:  None. IMPRESSION: Focal nonocclusive thrombus demonstrated in the right popliteal vein. These results were called by telephone at the time of interpretation on 11/25/2018 at 11:51 pm to Dr. Joni Fears , who verbally acknowledged these results. Electronically Signed   By: Lucienne Capers M.D.   On: 11/25/2018 23:55    Assessment and plan- Patient is a 73 y.o. male with recent diagnosed right lower extremity DVT, non invasive bladder cancer, presented for evaluation of acute onset right chest pain. CT chest angiogram showed acute right pulmonary embolism.   # Acute pulmonary embolism/acute lower extremity  DVT. Likely propagation of right lower extremity DVT and dislodged due to interrupted anticoagulation 1 week after developing acute right lower extremity DVT. He resumed on Lovenox and Eliquis 24 hours after procedure.  Less likely Eliquis failure. However in the acute setting, I recommend patient to continue IV heparin drip, and discharged on Lovenox 1mg /kg BID for 3 months, . He can follow up in 3 weeks outpatient with me and I can further manage his anticoagulation. May consider switch to Eliquis 5mg  BID.  Anticoagulation duration, recommend long term given his unprovoked feature, recent history of bladder cancer.  Will consider switch to Eliquis 2.5mg  BID after 6 months.   # Non invasive bladder cancer, s/p TURBT and intravesical chemotherapy. Follow up with Urology.    Thank you for allowing me to participate in the care of this patient.  Total face to face encounter time for this patient visit was 70 min. >50% of the time was  spent in counseling and coordination of care.    Earlie Server, MD, PhD Hematology Oncology [facility] Pager- [phone] 12/09/2018

## 2018-12-09 NOTE — Consult Note (Signed)
Phillip [name] for heparin infusion dosing and monitoring  Indication: pulmonary embolus  Patient Measurements: Height: 5\' 9"  (175.3 cm) Weight: 170 lb (77.1 kg) IBW/kg (Calculated) : 70.7  Vital Signs: Temp: 98.7 F (37.1 C) (03/22 0607) BP: 119/73 (03/22 0607) Pulse Rate: 57 (03/22 0607)  Labs: Recent Labs    12/07/18 1813 12/07/18 2035 12/08/18 0121 12/08/18 1024 12/08/18 1830 12/09/18 0407  HGB 16.0  --  14.5  --   --  13.8  HCT 49.4  --  45.1  --   --  42.9  PLT 170  --  153  --   --  121*  APTT 37*  --   --  91* 125* 145*  LABPROT 14.6  --   --   --   --   --   INR 1.2  --   --   --   --   --   HEPARINUNFRC  --   --  >3.60*  --   --  1.39*  CREATININE 1.18  --  1.04  --   --   --   TROPONINI 0.03* 0.03*  --   --   --   --     Estimated Creatinine Clearance: 64.2 mL/min (by C-G formula based on SCr of 1.04 mg/dL).   Medical History: Past Medical History:  Diagnosis Date  . Arthritis   . Bladder cancer ([place])   . Bladder neck contracture   . Cataract   . DVT (deep venous thrombosis) ([place]) 11/25/2018   right le  . Dyslipidemia   . ED (erectile dysfunction)   . Frequency   . GERD (gastroesophageal reflux disease)   . Hypertension   . Incontinence of urine    sui, s/p cryoablation  . Kidney stones   . Nocturia   . Prostate cancer ([place])    S/P   CRYOABLATION  . Right elbow tendinitis   . Wears glasses     Assessment: Pharmacy consulted for heparin drip dosing and monitoring for 73 yo male admitted with Bilateral PE and PMH of DVT (Dx 2 weeks ago). Patient reported taking Lovenox and Eliquis prior to admission. Patient was off anticoagulation for a few days for a procedure, then restarted.  Last dose Apixaban 10mg  and Lovenox 80mg  reported 3/20 @ ~ 2000.   Heparin Course 3/21 infusion started at 1350 units/hr 3/21 1024 aPTT 91s 3/21 1830 aPTT 125s: rate reduced to 1250 units/hr 3/22 0407 aPTT 145s: rate reduced to  1100 units/hr 3/22 1308 aPTT 100s   Goal of Therapy:  Heparin level 0.3-0.7 units/ml aPTT 66-102 seconds Monitor platelets by anticoagulation protocol: Yes   Plan:  --continue heparin infusion at 1100 Units/hr --Will need to adjust heparin infusion by aPTT levels until aPTT and anti-Xa levels corollate.  --Check aPTT level in 8 hours  --Hgb, PLT trending down: CBC in am, continue to monitor --Continue to monitor H&H and platelets  Phillip [name], PharmD Clinical Pharmacist 12/09/2018 12:21 PM

## 2018-12-09 NOTE — Plan of Care (Signed)

## 2018-12-09 NOTE — Progress Notes (Signed)
Pt d/c to home with wife. IVs removed intact. VSS. Education completed. All belongings sent with pt. All questions answered.

## 2018-12-09 NOTE — Progress Notes (Signed)
[facility] [place] NAME: Phillip [name]    MR#:  [id number]  DATE OF BIRTH:  [birth year]  SUBJECTIVE:  CHIEF COMPLAINT:   Chief Complaint  Patient presents with  . Chest Pain  Patient seen and evaluated today No chest pain today No complaints of shortness of breath  REVIEW OF SYSTEMS:    ROS  CONSTITUTIONAL: No documented fever. No fatigue, weakness. No weight gain, no weight loss.  EYES: No blurry or double vision.  ENT: No tinnitus. No postnasal drip. No redness of the oropharynx.  RESPIRATORY: No cough, no wheeze, no hemoptysis. No dyspnea.  CARDIOVASCULAR: No chest pain. No orthopnea. No palpitations. No syncope.  GASTROINTESTINAL: No nausea, no vomiting or diarrhea. No abdominal pain. No melena or hematochezia.  GENITOURINARY: No dysuria or hematuria.  ENDOCRINE: No polyuria or nocturia. No heat or cold intolerance.  HEMATOLOGY: No anemia. No bruising. No bleeding.  INTEGUMENTARY: No rashes. No lesions.  MUSCULOSKELETAL: No arthritis. No swelling. No gout.  NEUROLOGIC: No numbness, tingling, or ataxia. No seizure-type activity.  PSYCHIATRIC: No anxiety. No insomnia. No ADD.   DRUG ALLERGIES:   Allergies  Allergen Reactions  . Doxazosin Other (See Comments)    Other reaction(s): Unknown    VITALS:  Blood pressure 119/73, pulse (!) 57, temperature 98.7 F (37.1 C), resp. rate 16, height 5\' 9"  (1.753 m), weight 77.1 kg, SpO2 96 %.  PHYSICAL EXAMINATION:   Physical Exam  GENERAL:  73 y.o.-year-old patient lying in the bed with no acute distress.  EYES: Pupils equal, round, reactive to light and accommodation. No scleral icterus. Extraocular muscles intact.  HEENT: Head atraumatic, normocephalic. Oropharynx and nasopharynx clear.  NECK:  Supple, no jugular venous distention. No thyroid enlargement, no tenderness.  LUNGS: Normal breath sounds bilaterally, no wheezing, rales, rhonchi. No use of accessory muscles of  respiration.  CARDIOVASCULAR: S1, S2 normal. No murmurs, rubs, or gallops.  ABDOMEN: Soft, nontender, nondistended. Bowel sounds present. No organomegaly or mass.  EXTREMITIES: No cyanosis, clubbing or edema b/l.    NEUROLOGIC: Cranial nerves II through XII are intact. No focal Motor or sensory deficits b/l.   PSYCHIATRIC: The patient is alert and oriented x 3.  SKIN: No obvious rash, lesion, or ulcer.   LABORATORY PANEL:   CBC Recent Labs  Lab 12/09/18 0407  WBC 6.2  HGB 13.8  HCT 42.9  PLT 121*   ------------------------------------------------------------------------------------------------------------------ Chemistries  Recent Labs  Lab 12/08/18 0121  NA 138  K 4.0  CL 109  CO2 23  GLUCOSE 152*  BUN 25*  CREATININE 1.04  CALCIUM 8.4*   ------------------------------------------------------------------------------------------------------------------  Cardiac Enzymes Recent Labs  Lab 12/07/18 2035  TROPONINI 0.03*   ------------------------------------------------------------------------------------------------------------------  RADIOLOGY:  Ct Angio Chest Pe W And/or Wo Contrast  Result Date: 12/07/2018 CLINICAL DATA:  Right-sided chest pain. Recent diagnosis of DVT, currently on Lovenox and Eliquis. EXAM: CT ANGIOGRAPHY CHEST WITH CONTRAST TECHNIQUE: Multidetector CT imaging of the chest was performed using the standard protocol during bolus administration of intravenous contrast. Multiplanar CT image reconstructions and MIPs were obtained to evaluate the vascular anatomy. CONTRAST:  4mL OMNIPAQUE IOHEXOL 350 MG/ML SOLN COMPARISON:  None. FINDINGS: Cardiovascular: Satisfactory opacification of the pulmonary arteries to the segmental level. There are nonocclusive segmental and subsegmental pulmonary emboli within the right upper lobe, right middle lobe and right lower lobe pulmonary branches. The heart is mildly enlarged. No definite evidence of right heart strain.  The RV/LV ratio is 0.9. The heart  is mildly enlarged. There is no pericardial effusion. There are heavy calcific atherosclerotic calcifications of the coronary arteries. Mediastinum/Nodes: No enlarged mediastinal, hilar, or axillary lymph nodes. Thyroid gland, trachea, and esophagus demonstrate no significant findings. Lungs/Pleura: Hazy ground-glass opacities in the dependent portion of the right upper lobe and right lower lobe may represent areas of hypoperfusion versus atelectasis. Upper Abdomen: No acute abnormality. Musculoskeletal: No chest wall abnormality. No acute or significant osseous findings. Review of the MIP images confirms the above findings. IMPRESSION: 1. Nonocclusive segmental and subsegmental pulmonary emboli within the right upper lobe, right middle lobe and right lower lobe pulmonary branches. No evidence of right heart strain. 2. Mildly enlarged heart. 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 3. Hazy ground-glass opacities in the dependent portions of the right upper lobe and right lower lobe may represent areas of hypoperfusion versus atelectasis. Aortic Atherosclerosis (ICD10-I70.0). These results were called by telephone at the time of interpretation on 12/07/2018 at 9:15 pm to Dr. Eula Listen , who verbally acknowledged these results. Electronically Signed   By: Fidela Salisbury M.D.   On: 12/07/2018 21:17     ASSESSMENT AND PLAN:   73 year old male patient with a history of DVT 2 weeks ago started on anticoagulation, bladder cancer, hyperlipidemia, GERD, hypertension recently had surgery for bladder cancer at [facility].  He was then put on anticoagulation with Lovenox subcutaneously and Eliquis.  He developed pain in the right leg and chest discomfort.  -Acute right lung pulmonary embolism Continue IV heparin for anticoagulation Hematology consult for advise on anticoagulation Status post vascular surgery evaluation No acute  intervention recommended  -Right leg DVT Currently on anticoagulation with heparin drip Start oral anticoagulation once seen by hematology  -Right-sided chest pain secondary to pulmonary embolism Pain management to continue  -Bladder cancer Status post TURBT on December 05, 2018 by urology Follow-up to continue  -GERD Continue proton pump inhibitor   All the records are reviewed and case discussed with Care Management/Social Worker. Management plans discussed with the patient, family and they are in agreement.  CODE STATUS: Full code  DVT Prophylaxis: SCDs  TOTAL TIME TAKING CARE OF THIS PATIENT: 35 minutes.   POSSIBLE D/C IN 2 to 3 DAYS, DEPENDING ON CLINICAL CONDITION.  Saundra Shelling M.D on 12/09/2018 at 10:28 AM  Between 7am to 6pm - Pager - [phone]  After 6pm go to [email or web address] - password [email or web address] [facility]  Office  [phone]  CC: Primary care physician; Rusty Aus, MD  Note: This dictation was prepared with Dragon dictation along with smaller phrase technology. Any transcriptional errors that result from this process are unintentional.

## 2018-12-10 DIAGNOSIS — D6859 Other primary thrombophilia: Secondary | ICD-10-CM | POA: Insufficient documentation

## 2018-12-10 NOTE — Discharge Summary (Signed)
[facility] [place] NAME: Phillip [name]    MR#:  [id number]  DATE OF BIRTH:  September 17, 1946  DATE OF ADMISSION:  12/07/2018 ADMITTING PHYSICIAN: Lance Coon, MD  DATE OF DISCHARGE: 12/09/2018  4:05 PM  PRIMARY CARE PHYSICIAN: Rusty Aus, MD   ADMISSION DIAGNOSIS:  Elevated troponin [R79.89] Multiple pulmonary emboli (HCC) [I26.99]  DISCHARGE DIAGNOSIS:  Principal Problem:   Pulmonary emboli (HCC) Active Problems:   GERD (gastroesophageal reflux disease)   HTN (hypertension)   HLD (hyperlipidemia)   Bladder cancer (HCC)   Acute deep vein thrombosis (DVT) of calf muscle vein of right lower extremity   SECONDARY DIAGNOSIS:   Past Medical History:  Diagnosis Date  . Arthritis   . Bladder cancer ([place])   . Bladder neck contracture   . Cataract   . DVT (deep venous thrombosis) ([place]) 11/25/2018   right le  . Dyslipidemia   . ED (erectile dysfunction)   . Frequency   . GERD (gastroesophageal reflux disease)   . Hypertension   . Incontinence of urine    sui, s/p cryoablation  . Kidney stones   . Nocturia   . Prostate cancer ([place])    S/P   CRYOABLATION  . Right elbow tendinitis   . Wears glasses      ADMITTING HISTORY Phillip [name]  is a 73 y.o. male who presents with chief complaint as above.  Patient developed chest discomfort around 10:00 in the morning.  He was diagnosed with DVT about 2 weeks ago and started on anticoagulation.  He was also diagnosed with a bladder tumor.  He stopped his anticoagulation for 2 days prior to procedure to remove his bladder tumor.  This procedure was done 2 days ago.  Patient states that on the day of the procedure after it was done he developed significant right calf pain.  He restarted anticoagulation.  In the ED tonight he is shown on imaging to have bilateral pulmonary emboli.  Vascular surgery contacted by ED physician and recommends heparin drip for now, with low likelihood of  intervention.  Hospitalist were called for admission  HOSPITAL COURSE:  Patient was admitted to medical floor.  Patient was anticoagulated with IV heparin drip.  Vascular surgery consult was done who recommended no acute intervention.  Hematology consult were done.  They recommended anticoagulation with Lovenox 1 mg/kg subcutaneously twice daily.  Follow-up in the clinic.  Patient's chest pain completely resolved.  Patient was worked up with CTA chest which showed pulmonary embolism.  Patient was also worked up with echocardiogram which showed EF of 60 to 65% with no right heart strain.  Patient hemodynamically stable will be discharged home on Lovenox shots subcutaneously and follow-up in the hematology clinic.  CONSULTS OBTAINED:    DRUG ALLERGIES:   Allergies  Allergen Reactions  . Doxazosin Other (See Comments)    Other reaction(s): Unknown    DISCHARGE MEDICATIONS:   Allergies as of 12/09/2018      Reactions   Doxazosin Other (See Comments)   Other reaction(s): Unknown      Medication List    STOP taking these medications   cephALEXin 500 MG capsule Commonly known as:  KEFLEX   Eliquis DVT/PE Starter Pack 5 MG Tabs   naproxen sodium 220 MG tablet Commonly known as:  ALEVE   traMADol 50 MG tablet Commonly known as:  Ultram     TAKE these medications   amLODipine 5 MG tablet Commonly known as:  NORVASC Take 5 mg by mouth daily.   aspirin EC 81 MG tablet Take 81 mg by mouth daily.   benazepril 20 MG tablet Commonly known as:  LOTENSIN Take 20 mg by mouth daily.   cetirizine 10 MG tablet Commonly known as:  ZYRTEC Take 10 mg by mouth 2 (two) times daily.   cholecalciferol 25 MCG (1000 UT) tablet Commonly known as:  VITAMIN D3 Take 1,000 Units by mouth daily.   Chromium 1 MG Caps Take 1 mg by mouth daily. 1 per day   enoxaparin 80 MG/0.8ML injection Commonly known as:  LOVENOX Inject 0.8 mLs (80 mg total) into the skin 2 (two) times daily for 30  days. What changed:  additional instructions   GLUCOSAMINE CHONDR 1500 COMPLX PO Take 1 tablet by mouth daily.   multivitamin tablet Take 1 tablet by mouth daily.   niacin 500 MG tablet Take 500 mg by mouth at bedtime.   omeprazole 40 MG capsule Commonly known as:  PRILOSEC Take 40 mg by mouth daily.   oxybutynin 5 MG tablet Commonly known as:  DITROPAN Take 1 tablet (5 mg total) by mouth every 8 (eight) hours as needed for bladder spasms.   phenazopyridine 200 MG tablet Commonly known as:  Pyridium Take 1 tablet (200 mg total) by mouth 3 (three) times daily as needed (for pain with urination).   potassium phosphate (monobasic) 500 MG tablet Commonly known as:  K-PHOS ORIGINAL Take 500 mg by mouth daily.   testosterone cypionate 200 MG/ML injection Commonly known as:  DEPOTESTOSTERONE CYPIONATE Inject 200 mg into the muscle every 14 (fourteen) days.   TURMERIC PO Take 1 Dose by mouth daily.       Today  Patient seen today No chest pain No shortness of breath No fever Hemodynamically stable  VITAL SIGNS:  Blood pressure 129/75, pulse 70, temperature 97.8 F (36.6 C), temperature source Oral, resp. rate 16, height 5\' 9"  (1.753 m), weight 77.1 kg, SpO2 95 %.  I/O:  No intake or output data in the 24 hours ending 12/10/18 1429  PHYSICAL EXAMINATION:  Physical Exam  GENERAL:  73 y.o.-year-old patient lying in the bed with no acute distress.  LUNGS: Normal breath sounds bilaterally, no wheezing, rales,rhonchi or crepitation. No use of accessory muscles of respiration.  CARDIOVASCULAR: S1, S2 normal. No murmurs, rubs, or gallops.  ABDOMEN: Soft, non-tender, non-distended. Bowel sounds present. No organomegaly or mass.  NEUROLOGIC: Moves all 4 extremities. PSYCHIATRIC: The patient is alert and oriented x 3.  SKIN: No obvious rash, lesion, or ulcer.   DATA REVIEW:   CBC Recent Labs  Lab 12/09/18 0407  WBC 6.2  HGB 13.8  HCT 42.9  PLT 121*     Chemistries  Recent Labs  Lab 12/08/18 0121  NA 138  K 4.0  CL 109  CO2 23  GLUCOSE 152*  BUN 25*  CREATININE 1.04  CALCIUM 8.4*    Cardiac Enzymes Recent Labs  Lab 12/07/18 2035  TROPONINI 0.03*    Microbiology Results  No results found for this or any previous visit.  RADIOLOGY:  No results found.  Follow up with PCP in 1 week.  Management plans discussed with the patient, family and they are in agreement.  CODE STATUS: Full code Code Status History    Date Active Date Inactive Code Status Order ID Comments User Context   12/07/2018 2322 12/09/2018 1911 Full Code [id number]  Lance Coon, MD ED   12/06/2013 1430 12/07/2013 1503 Full Code  [id number]  Ailene Rud, MD Inpatient    Advance Directive Documentation     Most Recent Value  Type of Advance Directive  Living will  Pre-existing out of facility DNR order (yellow form or pink MOST form)  -  "MOST" Form in Place?  -      TOTAL TIME TAKING CARE OF THIS PATIENT ON DAY OF DISCHARGE: more than 37 minutes.   Saundra Shelling M.D on 12/10/2018 at 2:29 PM  Between 7am to 6pm - Pager - [phone]  After 6pm go to [email or web address] - password [email or web address] [facility]  Office  [phone]  CC: Primary care physician; Rusty Aus, MD  Note: This dictation was prepared with Dragon dictation along with smaller phrase technology. Any transcriptional errors that result from this process are unintentional.

## 2018-12-11 ENCOUNTER — Telehealth: Payer: Self-pay | Admitting: Oncology

## 2018-12-11 ENCOUNTER — Telehealth: Payer: Self-pay | Admitting: *Deleted

## 2018-12-11 NOTE — Telephone Encounter (Signed)
Called patient and he declines follow up appointment with Hematology for management of anticoagulation.  He prefers to follow up with primary care physician for that.

## 2018-12-11 NOTE — Telephone Encounter (Signed)
-----   Message from Billey Co sent at 12/11/2018  9:15 AM EDT ----- Regarding: RE: hospital follow up I called the pt and he declined the appt he states that him & Dr Sabra Heck has a game plan. ----- Message ----- From: Evelina Dun, RN Sent: 12/10/2018  10:34 AM EDT To: Billey Co Subject: FW: hospital follow up                          ----- Message ----- From: Earlie Server, MD Sent: 12/10/2018   8:27 AM EDT To: Maryann Conners, NT, Vernetta Honey, CMA, # Subject: hospital follow up                             Please arrange patient to follow up with me in 3 weeks. -hospital follow up

## 2019-03-08 ENCOUNTER — Encounter: Payer: Self-pay | Admitting: Emergency Medicine

## 2019-03-08 ENCOUNTER — Other Ambulatory Visit: Payer: Self-pay

## 2019-03-08 DIAGNOSIS — Z86711 Personal history of pulmonary embolism: Secondary | ICD-10-CM | POA: Diagnosis not present

## 2019-03-08 DIAGNOSIS — Z5321 Procedure and treatment not carried out due to patient leaving prior to being seen by health care provider: Secondary | ICD-10-CM | POA: Diagnosis not present

## 2019-03-08 DIAGNOSIS — R0602 Shortness of breath: Secondary | ICD-10-CM | POA: Diagnosis not present

## 2019-03-08 DIAGNOSIS — M549 Dorsalgia, unspecified: Secondary | ICD-10-CM | POA: Insufficient documentation

## 2019-03-08 NOTE — ED Triage Notes (Addendum)
Patient with complaint of upper back pain that started earlier today. Patient states that the pain is intermittent and that he becomes short of breath with the pain. Patient states that he has a history of PE in March and the pain feels similar. Patient states that he is currently on blood thinners. Patient states that the pain is worse with movement.

## 2019-03-09 ENCOUNTER — Emergency Department
Admission: EM | Admit: 2019-03-09 | Discharge: 2019-03-09 | Disposition: A | Discharge status: Home / Self Care | Payer: Medicare Other | Attending: Emergency Medicine | Admitting: Emergency Medicine

## 2019-03-09 HISTORY — DX: Personal history of pulmonary embolism: Z86.711

## 2019-03-09 LAB — CBC
HCT: 53.2 % — ABNORMAL HIGH (ref 39.0–52.0)
Hemoglobin: 17 g/dL (ref 13.0–17.0)
MCH: 31.3 pg (ref 26.0–34.0)
MCHC: 32 g/dL (ref 30.0–36.0)
MCV: 97.8 fL (ref 80.0–100.0)
Platelets: 249 10*3/uL (ref 150–400)
RBC: 5.44 MIL/uL (ref 4.22–5.81)
RDW: 13.2 % (ref 11.5–15.5)
WBC: 10 10*3/uL (ref 4.0–10.5)
nRBC: 0 % (ref 0.0–0.2)

## 2019-03-09 LAB — BASIC METABOLIC PANEL
Anion gap: 6 (ref 5–15)
BUN: 20 mg/dL (ref 8–23)
CO2: 29 mmol/L (ref 22–32)
Calcium: 9.1 mg/dL (ref 8.9–10.3)
Chloride: 106 mmol/L (ref 98–111)
Creatinine, Ser: 1.54 mg/dL — ABNORMAL HIGH (ref 0.61–1.24)
GFR calc Af Amer: 51 mL/min — ABNORMAL LOW (ref >60)
GFR calc non Af Amer: 44 mL/min — ABNORMAL LOW (ref >60)
Glucose, Bld: 114 mg/dL — ABNORMAL HIGH (ref 70–99)
Potassium: 4.8 mmol/L (ref 3.5–5.1)
Sodium: 141 mmol/L (ref 135–145)

## 2019-03-09 LAB — TROPONIN I: Troponin I: <0.03 ng/mL (ref <0.03)

## 2019-03-09 MED ORDER — LIDOCAINE 5 % EX PTCH
1.0000 | MEDICATED_PATCH | CUTANEOUS | Status: DC
  Administered 2019-03-09: 1 via TRANSDERMAL
  Filled 2019-03-09: qty 1

## 2019-03-11 ENCOUNTER — Telehealth: Payer: Self-pay | Admitting: Emergency Medicine

## 2019-03-11 NOTE — Telephone Encounter (Signed)
Called patient due to lwot to inquire about condition and follow up plans. Left message.   

## 2019-03-27 ENCOUNTER — Emergency Department
Admission: EM | Admit: 2019-03-27 | Discharge: 2019-03-27 | Disposition: A | Discharge status: Home / Self Care | Payer: Medicare Other | Attending: Emergency Medicine | Admitting: Emergency Medicine

## 2019-03-27 ENCOUNTER — Emergency Department: Payer: Medicare Other

## 2019-03-27 ENCOUNTER — Other Ambulatory Visit: Payer: Self-pay

## 2019-03-27 DIAGNOSIS — N200 Calculus of kidney: Secondary | ICD-10-CM | POA: Insufficient documentation

## 2019-03-27 DIAGNOSIS — I10 Essential (primary) hypertension: Secondary | ICD-10-CM | POA: Insufficient documentation

## 2019-03-27 DIAGNOSIS — R109 Unspecified abdominal pain: Secondary | ICD-10-CM

## 2019-03-27 DIAGNOSIS — Z7982 Long term (current) use of aspirin: Secondary | ICD-10-CM | POA: Insufficient documentation

## 2019-03-27 DIAGNOSIS — R1032 Left lower quadrant pain: Secondary | ICD-10-CM | POA: Insufficient documentation

## 2019-03-27 DIAGNOSIS — N281 Cyst of kidney, acquired: Secondary | ICD-10-CM | POA: Insufficient documentation

## 2019-03-27 DIAGNOSIS — Z79899 Other long term (current) drug therapy: Secondary | ICD-10-CM | POA: Diagnosis not present

## 2019-03-27 LAB — URINALYSIS, COMPLETE (UACMP) WITH MICROSCOPIC
Bacteria, UA: NONE SEEN
Bilirubin Urine: NEGATIVE
Glucose, UA: NEGATIVE mg/dL
Hgb urine dipstick: NEGATIVE
Ketones, ur: NEGATIVE mg/dL
Nitrite: NEGATIVE
Protein, ur: NEGATIVE mg/dL
Specific Gravity, Urine: 1.019 (ref 1.005–1.030)
pH: 6 (ref 5.0–8.0)

## 2019-03-27 LAB — CBC WITH DIFFERENTIAL/PLATELET
Abs Immature Granulocytes: 0.04 10*3/uL (ref 0.00–0.07)
Basophils Absolute: 0.1 10*3/uL (ref 0.0–0.1)
Basophils Relative: 1 %
Eosinophils Absolute: 0.3 10*3/uL (ref 0.0–0.5)
Eosinophils Relative: 3 %
HCT: 51.3 % (ref 39.0–52.0)
Hemoglobin: 16.2 g/dL (ref 13.0–17.0)
Immature Granulocytes: 0 %
Lymphocytes Relative: 20 %
Lymphs Abs: 1.9 10*3/uL (ref 0.7–4.0)
MCH: 30.3 pg (ref 26.0–34.0)
MCHC: 31.6 g/dL (ref 30.0–36.0)
MCV: 96.1 fL (ref 80.0–100.0)
Monocytes Absolute: 0.9 10*3/uL (ref 0.1–1.0)
Monocytes Relative: 9 %
Neutro Abs: 6.4 10*3/uL (ref 1.7–7.7)
Neutrophils Relative %: 67 %
Platelets: 247 10*3/uL (ref 150–400)
RBC: 5.34 MIL/uL (ref 4.22–5.81)
RDW: 13.4 % (ref 11.5–15.5)
WBC: 9.5 10*3/uL (ref 4.0–10.5)
nRBC: 0 % (ref 0.0–0.2)

## 2019-03-27 LAB — BASIC METABOLIC PANEL
Anion gap: 4 — ABNORMAL LOW (ref 5–15)
BUN: 16 mg/dL (ref 8–23)
CO2: 29 mmol/L (ref 22–32)
Calcium: 8.6 mg/dL — ABNORMAL LOW (ref 8.9–10.3)
Chloride: 111 mmol/L (ref 98–111)
Creatinine, Ser: 1.28 mg/dL — ABNORMAL HIGH (ref 0.61–1.24)
GFR calc Af Amer: >60 mL/min (ref >60)
GFR calc non Af Amer: 56 mL/min — ABNORMAL LOW (ref >60)
Glucose, Bld: 102 mg/dL — ABNORMAL HIGH (ref 70–99)
Potassium: 4.5 mmol/L (ref 3.5–5.1)
Sodium: 144 mmol/L (ref 135–145)

## 2019-03-27 MED ORDER — ONDANSETRON 4 MG PO TBDP
4.0000 mg | ORAL_TABLET | Freq: Once | ORAL | Status: AC
  Administered 2019-03-27: 07:00:00 4 mg via ORAL
  Filled 2019-03-27: qty 1

## 2019-03-27 MED ORDER — HYDROMORPHONE HCL 1 MG/ML IJ SOLN
1.0000 mg | Freq: Once | INTRAMUSCULAR | Status: AC
  Administered 2019-03-27: 1 mg via INTRAMUSCULAR
  Filled 2019-03-27: qty 1

## 2019-03-27 MED ORDER — SODIUM CHLORIDE 0.9 % IV BOLUS: 1000.0000 mL | Freq: Once | INTRAVENOUS | Status: DC

## 2019-03-27 MED ORDER — IOHEXOL 300 MG/ML  SOLN
100.0000 mL | Freq: Once | INTRAMUSCULAR | Status: AC | PRN
  Administered 2019-03-27: 09:00:00 100 mL via INTRAVENOUS

## 2019-03-27 NOTE — ED Triage Notes (Signed)
Pt arrives to ED via POV from home with c/o left-sided flank pain x2 hrs. Pt reports the pain feels like a kidney stone and states h/x of the same. No fever, no CP or SHOB. No c/o N/V/D. Pt denies any urinary changes, no hematuria.

## 2019-03-27 NOTE — ED Notes (Signed)
Patient transported to CT 

## 2019-03-27 NOTE — ED Provider Notes (Signed)
[facility] Emergency Department Provider Note   ____________________________________________   First MD Initiated Contact with Patient 03/27/19 [phone]     (approximate)  I have reviewed the triage vital signs and the nursing notes.   HISTORY  Chief Complaint Flank Pain    HPI Phillip [name] is a 73 y.o. male who presents to the ED from home with a chief complaint of left flank pain.  Patient was awakened by sudden onset left flank pain approximately 2 hours prior to arrival.  History of kidney stones and states this feels similarly.  Denies fever, cough, chest pain, shortness of breath, abdominal pain, nausea, vomiting, hematuria.  Of note, patient had COVID send out swab done by his PCP yesterday for possible exposure; he had been around his grandson who had been exposed to a friend whose mother was recently diagnosed COVID+.  Takes Eliquis for history of DVT/PE.       Past Medical History:  Diagnosis Date  . Arthritis   . Bladder cancer ([place])   . Bladder neck contracture   . Cataract   . DVT (deep venous thrombosis) ([place]) 11/25/2018   right le  . Dyslipidemia   . ED (erectile dysfunction)   . Frequency   . GERD (gastroesophageal reflux disease)   . History of pulmonary embolus (PE)   . Hypertension   . Incontinence of urine    sui, s/p cryoablation  . Kidney stones   . Nocturia   . Prostate cancer ([place])    S/P   CRYOABLATION  . Right elbow tendinitis   . Wears glasses     Patient Active Problem List   Diagnosis Date Noted  . Acute deep vein thrombosis (DVT) of calf muscle vein of right lower extremity   . Pulmonary emboli ([place]) 12/07/2018  . GERD (gastroesophageal reflux disease) 12/07/2018  . HTN (hypertension) 12/07/2018  . HLD (hyperlipidemia) 12/07/2018  . Prostate cancer ([place]) 12/07/2018  . Bladder cancer ([place]) 12/07/2018    Past Surgical History:  Procedure Laterality Date  . ARTHROPLASTY AND TENDON REPAIR LEFT THUMB  JAN  2014  . COLONOSCOPY    . COLONOSCOPY WITH PROPOFOL N/A 08/09/2017   Procedure: COLONOSCOPY WITH PROPOFOL;  Surgeon: Manya Silvas, MD;  Location: [facility] ENDOSCOPY;  Service: Endoscopy;  Laterality: N/A;  . ESOPHAGOGASTRODUODENOSCOPY (EGD) WITH PROPOFOL N/A 08/09/2017   Procedure: ESOPHAGOGASTRODUODENOSCOPY (EGD) WITH PROPOFOL;  Surgeon: Manya Silvas, MD;  Location: [facility] ENDOSCOPY;  Service: Endoscopy;  Laterality: N/A;  . GREEN LIGHT LASER TURP (TRANSURETHRAL RESECTION OF PROSTATE N/A 12/14/2015   Procedure: GREEN LIGHT LASER TURP (TRANSURETHRAL RESECTION OF PROSTATE) LASER OF BLADDER NECK CONTRACTURE;  Surgeon: Carolan Clines, MD;  Location: [place];  Service: Urology;  Laterality: N/A;  . PENILE PROSTHESIS IMPLANT N/A 12/06/2013   Procedure: PENILE PROTHESIS INFLATABLE;  Surgeon: Ailene Rud, MD;  Location: [facility];  Service: Urology;  Laterality: N/A;  . PROSTATE CRYOABLATION  06-01-2012  [facility]  . TRANSURETHRAL RESECTION OF BLADDER NECK N/A 03/20/2015   Procedure: RELEASE BLADDER NECK CONTRACTURE  WITH WOLF BUTTON ELECTRODE ;  Surgeon: Carolan Clines, MD;  Location: [place];  Service: Urology;  Laterality: N/A;  . TRANSURETHRAL RESECTION OF BLADDER TUMOR N/A 12/05/2018   Procedure: TRANSURETHRAL RESECTION OF BLADDER TUMOR (TURBT)/CYSTOSCOPY/  INSTILLATION OF Rodell Perna;  Surgeon: Ceasar Mons, MD;  Location: [facility];  Service: Urology;  Laterality: N/A;  . TRIGGER FINGER RELEASE Left 10/2015  . ulner  nerve neuropathy      Prior to Admission medications   Medication Sig Start Date End Date Taking? Authorizing Provider  amLODipine (NORVASC) 5 MG tablet Take 5 mg by mouth daily.    [provider]  aspirin EC 81 MG tablet Take 81 mg by mouth daily.    [provider]  benazepril (LOTENSIN) 20 MG tablet Take 20 mg by mouth daily.    [provider]   cetirizine (ZYRTEC) 10 MG tablet Take 10 mg by mouth 2 (two) times daily.     [provider]  cholecalciferol (VITAMIN D3) 25 MCG (1000 UT) tablet Take 1,000 Units by mouth daily.    [provider]  Chromium 1 MG CAPS Take 1 mg by mouth daily. 1 per day     [provider]  enoxaparin (LOVENOX) 80 MG/0.8ML injection Inject 0.8 mLs (80 mg total) into the skin 2 (two) times daily for 30 days. 12/09/18 01/08/19  Saundra Shelling, MD  Glucosamine-Chondroit-Vit C-Mn (GLUCOSAMINE CHONDR 1500 COMPLX PO) Take 1 tablet by mouth daily.    [provider]  Multiple Vitamin (MULTIVITAMIN) tablet Take 1 tablet by mouth daily.    [provider]  niacin 500 MG tablet Take 500 mg by mouth at bedtime.    [provider]  omeprazole (PRILOSEC) 40 MG capsule Take 40 mg by mouth daily.  08/27/16   [provider]  oxybutynin (DITROPAN) 5 MG tablet Take 1 tablet (5 mg total) by mouth every 8 (eight) hours as needed for bladder spasms. 12/05/18   Ceasar Mons, MD  phenazopyridine (PYRIDIUM) 200 MG tablet Take 1 tablet (200 mg total) by mouth 3 (three) times daily as needed (for pain with urination). 12/05/18 12/05/19  Ceasar Mons, MD  potassium phosphate, monobasic, (K-PHOS ORIGINAL) 500 MG tablet Take 500 mg by mouth daily.    [provider]  testosterone cypionate (DEPOTESTOSTERONE CYPIONATE) 200 MG/ML injection Inject 200 mg into the muscle every 14 (fourteen) days.  08/03/16   [provider]  TURMERIC PO Take 1 Dose by mouth daily.     [provider]    Allergies Doxazosin  No family history on file.  Social History Social History   Tobacco Use  . Smoking status: Never Smoker  . Smokeless tobacco: Never Used  Substance Use Topics  . Alcohol use: No  . Drug use: No    Review of Systems  Constitutional: No fever/chills Eyes: No visual changes. ENT: No sore throat. Cardiovascular: Denies  chest pain. Respiratory: Denies shortness of breath. Gastrointestinal: Positive for left flank pain.  No abdominal pain.  No nausea, no vomiting.  No diarrhea.  No constipation. Genitourinary: Negative for dysuria. Musculoskeletal: Negative for back pain. Skin: Negative for rash. Neurological: Negative for headaches, focal weakness or numbness.   ____________________________________________   PHYSICAL EXAM:  VITAL SIGNS: ED Triage Vitals  Enc Vitals Group     BP 03/27/19 0608 (!) 151/49     Pulse Rate 03/27/19 0608 64     Resp 03/27/19 0608 18     Temp 03/27/19 0608 97.8 F (36.6 C)     Temp Source 03/27/19 0608 Oral     SpO2 03/27/19 0608 99 %     Weight 03/27/19 0606 170 lb (77.1 kg)     Height 03/27/19 0606 5\' 9"  (1.753 m)     Head Circumference --      Peak Flow --      Pain Score 03/27/19 0606  10     Pain Loc --      Pain Edu? --      Excl. in [place]? --     Constitutional: Alert and oriented. Well appearing and in mild acute distress. Eyes: Conjunctivae are normal. PERRL. EOMI. Head: Atraumatic. Nose: No congestion/rhinnorhea. Mouth/Throat: Mucous membranes are moist.  Oropharynx non-erythematous. Neck: No stridor.   Cardiovascular: Normal rate, regular rhythm. Grossly normal heart sounds.  Good peripheral circulation. Respiratory: Normal respiratory effort.  No retractions. Lungs CTAB. Gastrointestinal: Soft and nontender. No distention. No abdominal bruits.  Mild left CVA tenderness. Musculoskeletal: No lower extremity tenderness nor edema.  No joint effusions. Neurologic:  Normal speech and language. No gross focal neurologic deficits are appreciated. No gait instability. Skin:  Skin is warm, dry and intact. No rash noted.  No vesicles. Psychiatric: Mood and affect are normal. Speech and behavior are normal.  ____________________________________________   LABS (all labs ordered are listed, but only abnormal results are displayed)  Labs Reviewed  URINALYSIS,  COMPLETE (UACMP) WITH MICROSCOPIC  CBC WITH DIFFERENTIAL/PLATELET  BASIC METABOLIC PANEL   ____________________________________________  EKG  None ____________________________________________  RADIOLOGY  ED MD interpretation: Pending  Official radiology report(s): No results found.  ____________________________________________   PROCEDURES  Procedure(s) performed (including Critical Care):  Procedures   ____________________________________________   INITIAL IMPRESSION / ASSESSMENT AND PLAN / ED COURSE  As part of my medical decision making, I reviewed the following data within the [place] notes reviewed and incorporated, Labs reviewed, Old chart reviewed, Radiograph reviewed and Notes from prior ED visits     DAMACIO WEISGERBER was evaluated in Emergency Department on 03/27/2019 for the symptoms described in the history of present illness. He was evaluated in the context of the global COVID-19 pandemic, which necessitated consideration that the patient might be at risk for infection with the SARS-CoV-2 virus that causes COVID-19. Institutional protocols and algorithms that pertain to the evaluation of patients at risk for COVID-19 are in a state of rapid change based on information released by regulatory bodies including the CDC and federal and state organizations. These policies and algorithms were followed during the patient's care in the ED.   73 year old male who presents with left flank pain; history of kidney stones. Differential diagnosis includes, but is not limited to, acute appendicitis, renal colic, testicular torsion, urinary tract infection/pyelonephritis, prostatitis,  epididymitis, diverticulitis, small bowel obstruction or ileus, colitis, abdominal aortic aneurysm, gastroenteritis, hernia, etc.  Patient currently refusing IV, states "it takes too long".  Instead requesting IM analgesia.  I tried to explain to the patient that he will get  faster and better relief and IV insertion would not take much longer than IM but patient declines for now.  I also explained that he may receive several venipunctures for lab work and for additional analgesia.   Clinical Course as of Mar 26 650  Wed Mar 27, 2019  0650 Care transferred to Dr. Jari Pigg pending all test results.   [JS]    Clinical Course User Index [JS] Paulette Blanch, MD     ____________________________________________   FINAL CLINICAL IMPRESSION(S) / ED DIAGNOSES  Final diagnoses:  Left flank pain     ED Discharge Orders    None       Note:  This document was prepared using Dragon voice recognition software and may include unintentional dictation errors.   Paulette Blanch, MD 03/27/19 [phone]

## 2019-03-27 NOTE — ED Provider Notes (Signed)
Assumed care for patient.   Per off going team, Prior kidney stones, 2 hours ago developed L flank pain. IM dilaudid, ODT zofran, blood work, urine, CT. Tested yesterday covid. Some positive contacts.    CT results:  IMPRESSION:  1. Bilateral nonobstructive nephrolithiasis.  2. Stable bilateral renal cysts.  3. Stable small low-density lesions in the spleen.  4. No acute findings in the abdomen or pelvis.   Patient is urine with 11-20 WBCs and trace leukocytes.  Creatinine at 1.28 which is down trending from 2 weeks ago at 1.54  Blood pressure (!) 151/49, pulse 64, temperature 97.8 F (36.6 C), temperature source Oral, resp. rate 18, height 5\' 9"  (1.753 m), weight 77.1 kg, SpO2 99 %.   8:07 AM reevaluated patient.  Pain is better with the Dilaudid however still  Having some pain.  Patient is concerned that the CT scan did not show any evidence of stones.  Patient also has no RBCs in his urine therefore unlikely to have already passed the stone.  Patient denies any symptoms to suggest UTI therefore this is unlikely to be pyelonephritis.  Discussed with patient that given his age and flank pain and on a blood thinner that we should get a CT scan with contrast to rule out AAA versus retroperitoneal bleed.  IMPRESSION: No acute abnormality identified in the abdomen and pelvis.  No hydronephrosis bilaterally.  Bilateral kidney cysts and splenic cysts.  Reevaluated patient.  Continues to have some mild pain on the left flank region that is worse with movement.  Also tender with palpation.  Given CT imaging is negative low suspicion for acute pathology.  Patient denies any chest pain to suggest ACS.  Patient had no vital signs changes to suggest worsening PEs.  Patient feels comfortable with following up with his primary care doctor and his oncologist.  He will return if his symptoms are worsening.       Vanessa Benson, MD 03/27/19 [phone]

## 2019-03-27 NOTE — ED Notes (Signed)
Introduced self to patient. Pt given a pillow. Resting in bed and awaiting CT results.

## 2019-03-27 NOTE — ED Notes (Signed)
Pt given a breakfast tray.

## 2019-03-27 NOTE — Discharge Instructions (Addendum)
Your CT scan was negative.  It showed some kidney stones inside your kidney but these are unlikely to be causing her pain.  You should use Tylenol for the pain can use up to 1 g every 8 hours.  You should follow-up with your primary care doctor and return to the ER if your symptoms are worsening or you develop any new symptoms.

## 2019-11-25 ENCOUNTER — Ambulatory Visit: Payer: PPO | Admitting: Podiatry

## 2019-12-03 DIAGNOSIS — D692 Other nonthrombocytopenic purpura: Secondary | ICD-10-CM | POA: Insufficient documentation

## 2019-12-26 ENCOUNTER — Other Ambulatory Visit: Payer: Self-pay | Admitting: Urology

## 2020-01-06 ENCOUNTER — Other Ambulatory Visit: Payer: Self-pay

## 2020-01-06 ENCOUNTER — Encounter (HOSPITAL_BASED_OUTPATIENT_CLINIC_OR_DEPARTMENT_OTHER): Payer: Self-pay | Admitting: Urology

## 2020-01-06 NOTE — Progress Notes (Signed)
Left message with Beola Cord, patient told to stop warfarin x 5 days, is dr Sabra Heck the  prescriber the person who gave instructions?

## 2020-01-06 NOTE — Progress Notes (Signed)
Received fax from [facility] patient is to stop warfarine 5 days prior to surgery per dr Sabra Heck, fax placed on chart

## 2020-01-06 NOTE — Progress Notes (Signed)
Spoke with patient via telephone for pre op interview. NPO after MN. Patient to take Prilosec and Norvasc AM of surgery with a sip of water. Will need ISTAT 8 AM of surgery. Current EKG in Epic. Arrival time 0800. Per patient MD, Dr. Sabra Heck patient is to stop coumadin 5 days pre op . Patient stated he had spoken with Dr. Ammie Ferrier nurse and verbalized understanding of these instructions.

## 2020-01-13 ENCOUNTER — Other Ambulatory Visit
Admission: RE | Admit: 2020-01-13 | Discharge: 2020-01-13 | Disposition: A | Discharge status: Home / Self Care | Payer: Medicare Other | Source: Ambulatory Visit | Attending: Urology | Admitting: Urology

## 2020-01-13 ENCOUNTER — Institutional Professional Consult (permissible substitution): Payer: Medicare Other

## 2020-01-13 ENCOUNTER — Other Ambulatory Visit: Payer: Self-pay

## 2020-01-13 DIAGNOSIS — Z01812 Encounter for preprocedural laboratory examination: Secondary | ICD-10-CM | POA: Diagnosis present

## 2020-01-13 DIAGNOSIS — Z20822 Contact with and (suspected) exposure to covid-19: Secondary | ICD-10-CM | POA: Insufficient documentation

## 2020-01-14 LAB — SARS CORONAVIRUS 2 (TAT 6-24 HRS): SARS Coronavirus 2: NEGATIVE

## 2020-01-14 NOTE — Progress Notes (Signed)
Spoke with patient by phone and made aware per dr winter instructions from [facility] no more etodolac may be taken before surgery, patient may take tylenol asneeded prior to surgery, patient vocalized understanding.

## 2020-01-15 ENCOUNTER — Ambulatory Visit (HOSPITAL_BASED_OUTPATIENT_CLINIC_OR_DEPARTMENT_OTHER)
Admission: RE | Admit: 2020-01-15 | Discharge: 2020-01-15 | Disposition: A | Discharge status: Home / Self Care | Payer: Medicare Other | Attending: Urology | Admitting: Urology

## 2020-01-15 ENCOUNTER — Ambulatory Visit (HOSPITAL_BASED_OUTPATIENT_CLINIC_OR_DEPARTMENT_OTHER): Payer: Medicare Other | Admitting: Anesthesiology

## 2020-01-15 ENCOUNTER — Encounter (HOSPITAL_BASED_OUTPATIENT_CLINIC_OR_DEPARTMENT_OTHER)
Admission: RE | Disposition: A | Discharge status: Home / Self Care | Payer: Self-pay | Source: Home / Self Care | Attending: Urology

## 2020-01-15 ENCOUNTER — Encounter (HOSPITAL_BASED_OUTPATIENT_CLINIC_OR_DEPARTMENT_OTHER): Payer: Self-pay | Admitting: Urology

## 2020-01-15 DIAGNOSIS — I10 Essential (primary) hypertension: Secondary | ICD-10-CM | POA: Diagnosis not present

## 2020-01-15 DIAGNOSIS — M1990 Unspecified osteoarthritis, unspecified site: Secondary | ICD-10-CM | POA: Diagnosis not present

## 2020-01-15 DIAGNOSIS — Z86718 Personal history of other venous thrombosis and embolism: Secondary | ICD-10-CM | POA: Insufficient documentation

## 2020-01-15 DIAGNOSIS — E785 Hyperlipidemia, unspecified: Secondary | ICD-10-CM | POA: Diagnosis not present

## 2020-01-15 DIAGNOSIS — Z87442 Personal history of urinary calculi: Secondary | ICD-10-CM | POA: Diagnosis not present

## 2020-01-15 DIAGNOSIS — Z8551 Personal history of malignant neoplasm of bladder: Secondary | ICD-10-CM | POA: Diagnosis not present

## 2020-01-15 DIAGNOSIS — K219 Gastro-esophageal reflux disease without esophagitis: Secondary | ICD-10-CM | POA: Insufficient documentation

## 2020-01-15 DIAGNOSIS — Z7901 Long term (current) use of anticoagulants: Secondary | ICD-10-CM | POA: Insufficient documentation

## 2020-01-15 DIAGNOSIS — Z20822 Contact with and (suspected) exposure to covid-19: Secondary | ICD-10-CM | POA: Diagnosis not present

## 2020-01-15 DIAGNOSIS — N3289 Other specified disorders of bladder: Secondary | ICD-10-CM | POA: Insufficient documentation

## 2020-01-15 DIAGNOSIS — Z8546 Personal history of malignant neoplasm of prostate: Secondary | ICD-10-CM | POA: Insufficient documentation

## 2020-01-15 DIAGNOSIS — Z86711 Personal history of pulmonary embolism: Secondary | ICD-10-CM | POA: Insufficient documentation

## 2020-01-15 HISTORY — PX: TRANSURETHRAL RESECTION OF BLADDER TUMOR: SHX2575

## 2020-01-15 LAB — POCT I-STAT, CHEM 8
BUN: 15 mg/dL (ref 8–23)
Calcium, Ion: 1.31 mmol/L (ref 1.15–1.40)
Chloride: 106 mmol/L (ref 98–111)
Creatinine, Ser: 1.2 mg/dL (ref 0.61–1.24)
Glucose, Bld: 111 mg/dL — ABNORMAL HIGH (ref 70–99)
HCT: 56 % — ABNORMAL HIGH (ref 39.0–52.0)
Hemoglobin: 19 g/dL — ABNORMAL HIGH (ref 13.0–17.0)
Potassium: 3.9 mmol/L (ref 3.5–5.1)
Sodium: 145 mmol/L (ref 135–145)
TCO2: 27 mmol/L (ref 22–32)

## 2020-01-15 SURGERY — TURBT (TRANSURETHRAL RESECTION OF BLADDER TUMOR)
Anesthesia: General | Site: Bladder

## 2020-01-15 MED ORDER — SODIUM CHLORIDE 0.9 % IR SOLN
Status: DC | PRN
  Administered 2020-01-15 (×2): 3000 mL via INTRAVESICAL

## 2020-01-15 MED ORDER — FENTANYL CITRATE (PF) 100 MCG/2ML IJ SOLN
INTRAMUSCULAR | Status: DC | PRN
  Administered 2020-01-15: 50 ug via INTRAVENOUS
  Administered 2020-01-15: 25 ug via INTRAVENOUS

## 2020-01-15 MED ORDER — CEFAZOLIN SODIUM-DEXTROSE 2-4 GM/100ML-% IV SOLN
2.0000 g | Freq: Once | INTRAVENOUS | Status: AC
  Administered 2020-01-15: 2 g via INTRAVENOUS

## 2020-01-15 MED ORDER — FENTANYL CITRATE (PF) 100 MCG/2ML IJ SOLN
INTRAMUSCULAR | Status: AC
  Filled 2020-01-15: qty 2

## 2020-01-15 MED ORDER — LIDOCAINE 2% (20 MG/ML) 5 ML SYRINGE
INTRAMUSCULAR | Status: AC
  Filled 2020-01-15: qty 5

## 2020-01-15 MED ORDER — DEXAMETHASONE SODIUM PHOSPHATE 10 MG/ML IJ SOLN
INTRAMUSCULAR | Status: DC | PRN
  Administered 2020-01-15: 5 mg via INTRAVENOUS

## 2020-01-15 MED ORDER — CEPHALEXIN 500 MG PO CAPS
500.0000 mg | ORAL_CAPSULE | Freq: Two times a day (BID) | ORAL | 0 refills | Status: AC
Start: 2020-01-15 — End: 2020-01-18

## 2020-01-15 MED ORDER — LACTATED RINGERS IV SOLN: INTRAVENOUS | Status: DC

## 2020-01-15 MED ORDER — OXYCODONE HCL 5 MG/5ML PO SOLN: 5.0000 mg | Freq: Once | ORAL | Status: DC | PRN

## 2020-01-15 MED ORDER — OXYCODONE HCL 5 MG PO TABS: 5.0000 mg | ORAL_TABLET | Freq: Once | ORAL | Status: DC | PRN

## 2020-01-15 MED ORDER — PHENAZOPYRIDINE HCL 200 MG PO TABS
200.0000 mg | ORAL_TABLET | Freq: Three times a day (TID) | ORAL | 0 refills | Status: DC | PRN
Start: 2020-01-15 — End: 2020-12-24

## 2020-01-15 MED ORDER — CEFAZOLIN SODIUM-DEXTROSE 2-4 GM/100ML-% IV SOLN
INTRAVENOUS | Status: AC
  Filled 2020-01-15: qty 100

## 2020-01-15 MED ORDER — ONDANSETRON HCL 4 MG/2ML IJ SOLN
INTRAMUSCULAR | Status: AC
  Filled 2020-01-15: qty 2

## 2020-01-15 MED ORDER — ACETAMINOPHEN 10 MG/ML IV SOLN: 1000.0000 mg | Freq: Once | INTRAVENOUS | Status: DC | PRN

## 2020-01-15 MED ORDER — PHENYLEPHRINE 40 MCG/ML (10ML) SYRINGE FOR IV PUSH (FOR BLOOD PRESSURE SUPPORT)
PREFILLED_SYRINGE | INTRAVENOUS | Status: AC
  Filled 2020-01-15: qty 10

## 2020-01-15 MED ORDER — PROPOFOL 10 MG/ML IV BOLUS
INTRAVENOUS | Status: AC
  Filled 2020-01-15: qty 20

## 2020-01-15 MED ORDER — ONDANSETRON HCL 4 MG/2ML IJ SOLN
INTRAMUSCULAR | Status: DC | PRN
  Administered 2020-01-15: 4 mg via INTRAVENOUS

## 2020-01-15 MED ORDER — PROPOFOL 10 MG/ML IV BOLUS
INTRAVENOUS | Status: DC | PRN
  Administered 2020-01-15: 20 mg via INTRAVENOUS
  Administered 2020-01-15: 150 mg via INTRAVENOUS

## 2020-01-15 MED ORDER — TRAMADOL HCL 50 MG PO TABS: 50.0000 mg | ORAL_TABLET | Freq: Four times a day (QID) | ORAL | 0 refills | Status: AC | PRN

## 2020-01-15 MED ORDER — PHENYLEPHRINE 40 MCG/ML (10ML) SYRINGE FOR IV PUSH (FOR BLOOD PRESSURE SUPPORT)
PREFILLED_SYRINGE | INTRAVENOUS | Status: DC | PRN
  Administered 2020-01-15: 120 ug via INTRAVENOUS
  Administered 2020-01-15: 160 ug via INTRAVENOUS
  Administered 2020-01-15: 120 ug via INTRAVENOUS

## 2020-01-15 MED ORDER — LIDOCAINE 2% (20 MG/ML) 5 ML SYRINGE
INTRAMUSCULAR | Status: DC | PRN
  Administered 2020-01-15: 100 mg via INTRAVENOUS

## 2020-01-15 MED ORDER — FENTANYL CITRATE (PF) 100 MCG/2ML IJ SOLN: 25.0000 ug | INTRAMUSCULAR | Status: DC | PRN

## 2020-01-15 MED ORDER — DEXAMETHASONE SODIUM PHOSPHATE 10 MG/ML IJ SOLN
INTRAMUSCULAR | Status: AC
  Filled 2020-01-15: qty 1

## 2020-01-15 SURGICAL SUPPLY — 22 items
BAG DRAIN URO-CYSTO SKYTR STRL (DRAIN) ×3 IMPLANT
BAG URINE DRAIN 2000ML AR STRL (UROLOGICAL SUPPLIES) IMPLANT
BAG URINE LEG 500ML (DRAIN) IMPLANT
GLOVE BIO SURGEON STRL SZ7.5 (GLOVE) ×3 IMPLANT
GLOVE BIOGEL PI IND STRL 7.0 (GLOVE) ×2 IMPLANT
GLOVE BIOGEL PI IND STRL 7.5 (GLOVE) ×1 IMPLANT
GLOVE BIOGEL PI INDICATOR 7.0 (GLOVE) ×4
GLOVE BIOGEL PI INDICATOR 7.5 (GLOVE) ×2
GOWN STRL REUS W/TWL XL LVL3 (GOWN DISPOSABLE) ×9 IMPLANT
HOLDER FOLEY CATH W/STRAP (MISCELLANEOUS) IMPLANT
IV NS IRRIG 3000ML ARTHROMATIC (IV SOLUTION) ×9 IMPLANT
LOOP CUT BIPOLAR 24F LRG (ELECTROSURGICAL) ×3 IMPLANT
MANIFOLD NEPTUNE II (INSTRUMENTS) ×3 IMPLANT
NS IRRIG 500ML POUR BTL (IV SOLUTION) IMPLANT
SYR TOOMEY IRRIG 70ML (MISCELLANEOUS) ×3
SYRINGE TOOMEY IRRIG 70ML (MISCELLANEOUS) ×1 IMPLANT
TRAY CYSTO PACK (CUSTOM PROCEDURE TRAY) ×3 IMPLANT
TUBE CONNECTING 12'X1/4 (SUCTIONS) ×1
TUBE CONNECTING 12X1/4 (SUCTIONS) ×2 IMPLANT
TUBING UROLOGY SET (TUBING) ×3 IMPLANT
WATER STERILE IRR 3000ML UROMA (IV SOLUTION) IMPLANT
WATER STERILE IRR 500ML POUR (IV SOLUTION) IMPLANT

## 2020-01-15 NOTE — H&P (Signed)
Urology Preoperative H&P   Chief Complaint: bladder tumor  History of Present Illness: Phillip [name] is a 74 y.o. male with a history of prostate cancer treated with cryotherapy in 2013 and hypogonadism (tx w/ IMTC). He is s/p IPP placement in 2015. He had a subsequent bladder neck contracture that required a TURP/greenlight TUIP. He has been dealing with incontinence since then. He tried a Cunningham clamp in the past, but was too uncomfortable secondary to his IPP. 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  PMHx of DVT --currently on Warfarin   Last PSA- 0.13 (08/2019), 0.044 (05/2018), 0.12 (05/2017) stable  Last testosterone-305.2 (08/2019), 259.1,  Hct- 45.8 (05/2018)-- currently on 1 cc of IMTC every other week   The patient underwent a surveillance cystoscopy on 12/09/2019 and was found to have a prostatic urethral lesion concerning for either prostatic regrowth or a urothelial carcinoma recurrence.  He does report worsening of his lower urinary tract symptoms over the past several months.  He is here today for surgical resection of the prostatic urethral lesion noted on cystoscopy in March.  He denies interval episodes of gross hematuria, dysuria or urinary tract infections.    Past Medical History:  Diagnosis Date  . Arthritis   . Bladder cancer ([place])   . Bladder neck contracture   . Cataract   . DVT (deep venous thrombosis) ([place]) 11/25/2018   right le  . Dyslipidemia   . ED (erectile dysfunction)   . Frequency   . GERD (gastroesophageal reflux disease)   . History of pulmonary embolus (PE)   . Hypertension   . Incontinence of urine    sui, s/p cryoablation  . Kidney stones   . Nocturia   . Prostate cancer ([place])    S/P   CRYOABLATION  . Right elbow tendinitis   . Wears glasses     Past Surgical History:  Procedure Laterality Date  . ARTHROPLASTY AND TENDON REPAIR LEFT THUMB  JAN 2014  . COLONOSCOPY    . COLONOSCOPY WITH  PROPOFOL N/A 08/09/2017   Procedure: COLONOSCOPY WITH PROPOFOL;  Surgeon: Manya Silvas, MD;  Location: [facility] ENDOSCOPY;  Service: Endoscopy;  Laterality: N/A;  . ESOPHAGOGASTRODUODENOSCOPY (EGD) WITH PROPOFOL N/A 08/09/2017   Procedure: ESOPHAGOGASTRODUODENOSCOPY (EGD) WITH PROPOFOL;  Surgeon: Manya Silvas, MD;  Location: [facility] ENDOSCOPY;  Service: Endoscopy;  Laterality: N/A;  . GREEN LIGHT LASER TURP (TRANSURETHRAL RESECTION OF PROSTATE N/A 12/14/2015   Procedure: GREEN LIGHT LASER TURP (TRANSURETHRAL RESECTION OF PROSTATE) LASER OF BLADDER NECK CONTRACTURE;  Surgeon: Carolan Clines, MD;  Location: [place];  Service: Urology;  Laterality: N/A;  . PENILE PROSTHESIS IMPLANT N/A 12/06/2013   Procedure: PENILE PROTHESIS INFLATABLE;  Surgeon: Ailene Rud, MD;  [facility];  Service: Urology;  Laterality: N/A;  . PROSTATE CRYOABLATION  06-01-2012  [facility]  . TRANSURETHRAL RESECTION OF BLADDER NECK N/A 03/20/2015   Procedure: RELEASE BLADDER NECK CONTRACTURE  WITH WOLF BUTTON ELECTRODE ;  Surgeon: Carolan Clines, MD;  Location: [place];  Service: Urology;  Laterality: N/A;  . TRANSURETHRAL RESECTION OF BLADDER TUMOR N/A 12/05/2018   Procedure: TRANSURETHRAL RESECTION OF BLADDER TUMOR (TURBT)/CYSTOSCOPY/  INSTILLATION OF Rodell Perna;  Surgeon: Ceasar Mons, MD;  Location: [facility];  Service: Urology;  Laterality: N/A;  . TRIGGER FINGER RELEASE Left 10/2015  . ulner nerve neuropathy      Allergies:  Allergies  Allergen Reactions  .  Doxazosin Other (See Comments)    Other reaction(s): Unknown    History reviewed. No pertinent family history.  Social History:  reports that he has never smoked. He has never used smokeless tobacco. He reports that he does not drink alcohol or use drugs.  ROS: A complete review of systems was performed.  All systems are negative except for pertinent  findings as noted.  Physical Exam:  Vital signs in last 24 hours: Temp:  [97.9 F (36.6 C)] 97.9 F (36.6 C) (04/28 0830) Pulse Rate:  [83] 83 (04/28 0830) Resp:  [15] 15 (04/28 0830) BP: (151)/(100) 151/100 (04/28 0830) SpO2:  [100 %] 100 % (04/28 0830) Weight:  [78.6 kg] 78.6 kg (04/28 0830) Constitutional:  Alert and oriented, No acute distress Cardiovascular: Regular rate and rhythm, No JVD Respiratory: Normal respiratory effort, Lungs clear bilaterally GI: Abdomen is soft, nontender, nondistended, no abdominal masses GU: No CVA tenderness Lymphatic: No lymphadenopathy Neurologic: Grossly intact, no focal deficits Psychiatric: Normal mood and affect  Laboratory Data:  Recent Labs    01/15/20 0858  HGB 19.0*  HCT 56.0*    Recent Labs    01/15/20 0858  NA 145  K 3.9  CL 106  GLUCOSE 111*  BUN 15  CREATININE 1.20     Results for orders placed or performed during the hospital encounter of 01/15/20 (from the past 24 hour(s))  I-STAT, chem 8     Status: Abnormal   Collection Time: 01/15/20  8:58 AM  Result Value Ref Range   Sodium 145 135 - 145 mmol/L   Potassium 3.9 3.5 - 5.1 mmol/L   Chloride 106 98 - 111 mmol/L   BUN 15 8 - 23 mg/dL   Creatinine, Ser 1.20 0.61 - 1.24 mg/dL   Glucose, Bld 111 (H) 70 - 99 mg/dL   Calcium, Ion 1.31 1.15 - 1.40 mmol/L   TCO2 27 22 - 32 mmol/L   Hemoglobin 19.0 (H) 13.0 - 17.0 g/dL   HCT 56.0 (H) 39.0 - 52.0 %   Recent Results (from the past 240 hour(s))  SARS CORONAVIRUS 2 (TAT 6-24 HRS) Nasopharyngeal Nasopharyngeal Swab     Status: None   Collection Time: 01/13/20 11:43 AM   Specimen: Nasopharyngeal Swab  Result Value Ref Range Status   SARS Coronavirus 2 NEGATIVE NEGATIVE Final    Comment: (NOTE) SARS-CoV-2 target nucleic acids are NOT DETECTED. The SARS-CoV-2 RNA is generally detectable in upper and lower respiratory specimens during the acute phase of infection. Negative results do not preclude SARS-CoV-2 infection,  do not rule out co-infections with other pathogens, and should not be used as the sole basis for treatment or other patient management decisions. Negative results must be combined with clinical observations, patient history, and epidemiological information. The expected result is Negative. Fact Sheet for Patients: SugarRoll.be Fact Sheet for Healthcare Providers: https://www.woods-mathews.com/ This test is not yet approved or cleared by the [place] FDA and  has been authorized for detection and/or diagnosis of SARS-CoV-2 by FDA under an Emergency Use Authorization (EUA). This EUA will remain  in effect (meaning this test can be used) for the duration of the COVID-19 declaration under Section 56 4(b)(1) of the Act, 21 U.S.C. section 360bbb-3(b)(1), unless the authorization is terminated or revoked sooner. Performed at [facility], [place] [address], [place], [place] [address]     Renal Function: Recent Labs    01/15/20 5681  CREATININE 1.20   Estimated Creatinine Clearance: 54.8 mL/min (by C-G formula based  on SCr of 1.2 mg/dL).  Radiologic Imaging: No results found.  I independently reviewed the above imaging studies.  Assessment and Plan Phillip [name] is a 74 y.o. male with a history of prostate cancer and TA urothelial carcinoma of the bladder.  He was found to have a prostatic urethral lesion on surveillance cystoscopy from 12/09/2019 and is here today for surgical resection of that lesion.  -The risks, benefits and alternatives of cystoscopy with TURBT was discussed with the patient. The risks include, but are not limited to, bleeding, urinary tract infection, urethral stricture formation, bladder perforation requiring prolonged catheterization and/or open bladder repair, ureteral obstruction, voiding dysfunction and the inherent risks of general anesthesia. The patient voices understanding and wishes to proceed.    Ellison Hughs, MD 01/15/2020, 9:19 AM  [facility] Pager: [phone]

## 2020-01-15 NOTE — Transfer of Care (Signed)
    Last Vitals:  Vitals Value Taken Time  BP 141/77 01/15/20 1024  Temp    Pulse 67 01/15/20 1026  Resp 12 01/15/20 1026  SpO2 99 % 01/15/20 1026  Vitals shown include unvalidated device data.  Last Pain:  Vitals:   01/15/20 0830  TempSrc: Oral  PainSc: 5       Patients Stated Pain Goal: 5 (01/15/20 0830) Immediate Anesthesia Transfer of Care Note  Patient: Phillip [name]  Procedure(s) Performed: Procedure(s) (LRB): TRANSURETHRAL RESECTION OF BLADDER TUMOR (TURBT)/ CYSTOSCOPY (N/A)  Patient Location: PACU  Anesthesia Type: General  Level of Consciousness: awake, alert  and oriented  Airway & Oxygen Therapy: Patient Spontanous Breathing and Patient connected to nasal cannula oxygen  Post-op Assessment: Report given to PACU RN and Post -op Vital signs reviewed and stable  Post vital signs: Reviewed and stable  Complications: No apparent anesthesia complications

## 2020-01-15 NOTE — Discharge Instructions (Signed)
CYSTOSCOPY HOME CARE INSTRUCTIONS  Activity: Rest for the remainder of the day.  Do not drive or operate equipment today.  You may resume normal activities in one to two days as instructed by your physician.   Meals: Drink plenty of liquids and eat light foods such as gelatin or soup this evening.  You may return to a normal meal plan tomorrow.  Return to Work: You may return to work in one to two days or as instructed by your physician.  Special Instructions / Symptoms: Call your physician if any of these symptoms occur:   -persistent or heavy bleeding  -bleeding which continues after first few urination  -large blood clots that are difficult to pass  -urine stream diminishes or stops completely  -fever equal to or higher than 101 degrees Farenheit.  -cloudy urine with a strong, foul odor  -severe pain  Females should always wipe from front to back after elimination.  You may feel some burning pain when you urinate.  This should disappear with time.  Applying moist heat to the lower abdomen or a hot tub bath may help relieve the pain. \  Follow-Up / Date of Return Visit to Your Physician:  As instructed Post Anesthesia Home Care Instructions  Activity: Get plenty of rest for the remainder of the day. A responsible individual must stay with you for 24 hours following the procedure.  For the next 24 hours, DO NOT: -Drive a car -Paediatric nurse -Drink alcoholic beverages -Take any medication unless instructed by your physician -Make any legal decisions or sign important papers.  Meals: Start with liquid foods such as gelatin or soup. Progress to regular foods as tolerated. Avoid greasy, spicy, heavy foods. If nausea and/or vomiting occur, drink only clear liquids until the nausea and/or vomiting subsides. Call your physician if vomiting continues.  Special Instructions/Symptoms: Your throat may feel dry or sore from the anesthesia or the breathing tube placed in your throat  during surgery. If this causes discomfort, gargle with warm salt water. The discomfort should disappear within 24 hours.  If you had a scopolamine patch placed behind your ear for the management of post- operative nausea and/or vomiting:  1. The medication in the patch is effective for 72 hours, after which it should be removed.  Wrap patch in a tissue and discard in the trash. Wash hands thoroughly with soap and water. 2. You may remove the patch earlier than 72 hours if you experience unpleasant side effects which may include dry mouth, dizziness or visual disturbances. 3. Avoid touching the patch. Wash your hands with soap and water after contact with the patch.     Call for an appointment to arrange follow-up.  Patient Signature:  ________________________________________________________  Nurse's Signature:  ________________________________________________________

## 2020-01-15 NOTE — Anesthesia Procedure Notes (Signed)
Procedure Name: LMA Insertion Date/Time: 01/15/2020 9:43 AM Performed by: Mechele Claude, CRNA Pre-anesthesia Checklist: Patient identified, Emergency Drugs available, Suction available and Patient being monitored Patient Re-evaluated:Patient Re-evaluated prior to induction Oxygen Delivery Method: Circle system utilized Preoxygenation: Pre-oxygenation with 100% oxygen Induction Type: IV induction Ventilation: Mask ventilation without difficulty LMA: LMA inserted LMA Size: 4.0 Number of attempts: 1 Airway Equipment and Method: Bite block Placement Confirmation: positive ETCO2 Tube secured with: Tape Dental Injury: Teeth and Oropharynx as per pre-operative assessment

## 2020-01-15 NOTE — Anesthesia Preprocedure Evaluation (Signed)
Anesthesia Evaluation  Patient identified by MRN, date of birth, ID band Patient awake    Reviewed: Allergy & Precautions, NPO status , Patient's Chart, lab work & pertinent test results  Airway Mallampati: II  TM Distance: >3 FB Neck ROM: Full    Dental no notable dental hx.    Pulmonary PE H/O PE   Pulmonary exam normal breath sounds clear to auscultation       Cardiovascular hypertension, Pt. on medications Normal cardiovascular exam Rhythm:Regular Rate:Normal     Neuro/Psych negative neurological ROS  negative psych ROS   GI/Hepatic Neg liver ROS, GERD  Medicated,  Endo/Other  negative endocrine ROS  Renal/GU negative Renal ROS  negative genitourinary   Musculoskeletal negative musculoskeletal ROS (+)   Abdominal   Peds negative pediatric ROS (+)  Hematology negative hematology ROS (+)   Anesthesia Other Findings   Reproductive/Obstetrics negative OB ROS                             Anesthesia Physical Anesthesia Plan  ASA: III  Anesthesia Plan: General   Post-op Pain Management:    Induction: Intravenous  PONV Risk Score and Plan: 2 and Ondansetron, Dexamethasone and Treatment may vary due to age or medical condition  Airway Management Planned: LMA  Additional Equipment:   Intra-op Plan:   Post-operative Plan: Extubation in OR  Informed Consent: I have reviewed the patients History and Physical, chart, labs and discussed the procedure including the risks, benefits and alternatives for the proposed anesthesia with the patient or authorized representative who has indicated his/her understanding and acceptance.     Dental advisory given  Plan Discussed with: CRNA and Surgeon  Anesthesia Plan Comments:         Anesthesia Quick Evaluation

## 2020-01-15 NOTE — Anesthesia Postprocedure Evaluation (Signed)
Anesthesia Post Note  Patient: Phillip [name]  Procedure(s) Performed: TRANSURETHRAL RESECTION OF BLADDER TUMOR (TURBT)/ CYSTOSCOPY (N/A Bladder)     Patient location during evaluation: PACU Anesthesia Type: General Level of consciousness: awake and alert Pain management: pain level controlled Vital Signs Assessment: post-procedure vital signs reviewed and stable Respiratory status: spontaneous breathing, nonlabored ventilation, respiratory function stable and patient connected to nasal cannula oxygen Cardiovascular status: blood pressure returned to baseline and stable Postop Assessment: no apparent nausea or vomiting Anesthetic complications: no    Last Vitals:  Vitals:   01/15/20 1100 01/15/20 1143  BP:  (!) 155/85  Pulse: 63 (!) 59  Resp: 15 16  Temp:  36.5 C  SpO2: 95% 100%    Last Pain:  Vitals:   01/15/20 1143  TempSrc: Oral  PainSc:                  Daci Stubbe S

## 2020-01-15 NOTE — Op Note (Signed)
Operative Note  Preoperative diagnosis:  1.  2 cm bladder neck lesion 2.  History of prostate cancer 3.  History of TA urothelial carcinoma the bladder  Postoperative diagnosis: 1.  2 cm bladder neck lesion 2.  History of prostate cancer 3.  History of TA urothelial carcinoma the bladder  Procedure(s): 1.  Cystoscopy with TURBT (medium)  Surgeon: Ellison Hughs, MD  Assistants:  None  Anesthesia:  General  Complications:  None  EBL: 10 mL  Specimens: 1.  Bladder neck lesion  Drains/Catheters: 1.  None  Intraoperative findings:   1. 2 cm bladder neck lesion that was ball valving into the prostatic urethra.  No clear papillary features were identified cystoscopically.  No other intravesical lesions were noted during cystoscopy.  Indication:  Phillip [name] is a 74 y.o. male with a history of prostate cancer treated via cryotherapy in 2013 along with TA urothelial carcinoma, status post TURBT on 12/05/2018.  He also has a history of hypogonadism and is using IM testosterone cypionate.  During a surveillance cystoscopy, the patient was found to have a 2 cm bladder neck lesion that was ball valving into the prostatic urethra, concerning for a urothelial carcinoma recurrence versus prostate adenoma.  The patient has been consented for the above procedures, voices understanding and wishes to proceed.  Description of procedure:  After informed consent was obtained, the patient was brought to the operating room and general LMA anesthesia was administered. The patient was then placed in the dorsolithotomy position and prepped and draped in the usual sterile fashion. A timeout was performed. A 23 French rigid cystoscope was then inserted into the urethral meatus and advanced into the bladder under direct vision. A complete bladder survey revealed the 2 cm bladder neck lesion at the 2 o'clock position with no other intravesical pathology.  The 23 French rigid cystoscope was then  exchanged for a 26 [place] resectoscope with a bipolar loop working element.  The bladder neck lesion was then resected until bladder neck fibers could be identified.  The specimen was then evacuated from the lumen of the bladder through the cystoscope sheath.  The area of resection was then extensively fulgurated until hemostasis was achieved.  The patient's bladder was drained.  He tolerated the procedure well and was transferred to the postanesthesia unit in stable condition.  Plan: Follow-up on 01/31/2020.  During his preoperative evaluation, the patient's hematocrit was found to be approximately 56.0.  I will arrange a blood transfusion as an outpatient.

## 2020-01-16 LAB — SURGICAL PATHOLOGY

## 2020-04-20 DIAGNOSIS — G620 Drug-induced polyneuropathy: Secondary | ICD-10-CM | POA: Insufficient documentation

## 2020-04-20 DIAGNOSIS — E039 Hypothyroidism, unspecified: Secondary | ICD-10-CM | POA: Insufficient documentation

## 2020-04-20 DIAGNOSIS — T451X5A Adverse effect of antineoplastic and immunosuppressive drugs, initial encounter: Secondary | ICD-10-CM | POA: Insufficient documentation

## 2020-06-05 ENCOUNTER — Encounter: Payer: Self-pay | Admitting: Emergency Medicine

## 2020-06-05 ENCOUNTER — Other Ambulatory Visit: Payer: Self-pay

## 2020-06-05 ENCOUNTER — Ambulatory Visit
Admission: EM | Admit: 2020-06-05 | Discharge: 2020-06-05 | Disposition: A | Discharge status: Home / Self Care | Payer: Medicare Other

## 2020-06-05 DIAGNOSIS — S76912A Strain of unspecified muscles, fascia and tendons at thigh level, left thigh, initial encounter: Secondary | ICD-10-CM

## 2020-06-05 MED ORDER — TRAMADOL HCL 50 MG PO TABS: 50.0000 mg | ORAL_TABLET | Freq: Four times a day (QID) | ORAL | 0 refills | Status: AC | PRN

## 2020-06-05 MED ORDER — BACLOFEN 20 MG PO TABS: 20.0000 mg | ORAL_TABLET | Freq: Three times a day (TID) | ORAL | 0 refills | Status: AC

## 2020-06-05 NOTE — ED Triage Notes (Signed)
Patient states that he started having left outer thigh pain and spasm that started around 9am while he was sitting on the comode.  Patient states that his pain has gotten worse over the day.  Patient denies fall or injury.  Patient states that he did play gold the day before.

## 2020-06-05 NOTE — ED Provider Notes (Signed)
[facility]    CSN: [id number] Arrival date & time: 06/05/20  1624      History   Chief Complaint Chief Complaint  Patient presents with  . Leg Pain    left outer thigh    HPI Phillip [name] is a 74 y.o. male.   Patient presents for left lateral thigh pain that started this morning.  He says he started develop what felt like a cramp when he was sitting on the toilet.  He says the pain does seem to get worse throughout the day.  He denies any fall or known injury.  He says he recently did play golf and admits to moving furniture around today.  Patient states he is very active and has never had anything like this before.  He denies any associated back pain.  He denies any numbness or tingling associate with this injury.  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  He says he does not know why.  He takes diclofenac for the neuropathy.  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  Patient states he has taken Tylenol for pain with slight relief.  He denies any fevers or skin changes.  He denies any associated swelling.  He says pain is increased when he is standing and walking as well as when he is sitting with his knee bent.  Pain is relieved a little when he is laying flat.  He says the entire lateral thigh muscle is tender to touch.  No other concerns today.  HPI  Past Medical History:  Diagnosis Date  . Arthritis   . Bladder cancer ([place])   . Bladder neck contracture   . Cataract   . DVT (deep venous thrombosis) ([place]) 11/25/2018   right le  . Dyslipidemia   . ED (erectile dysfunction)   . Frequency   . GERD (gastroesophageal reflux disease)   . History of pulmonary embolus (PE)   . Hypertension   . Incontinence of urine    sui, s/p cryoablation  . Kidney stones   . Nocturia   . Prostate cancer ([place])    S/P   CRYOABLATION  . Right elbow tendinitis   . Wears glasses     Patient Active Problem  List   Diagnosis Date Noted  . Acute deep vein thrombosis (DVT) of calf muscle vein of right lower extremity ([place])   . Pulmonary emboli ([place]) 12/07/2018  . GERD (gastroesophageal reflux disease) 12/07/2018  . HTN (hypertension) 12/07/2018  . HLD (hyperlipidemia) 12/07/2018  . Prostate cancer ([place]) 12/07/2018  . Bladder cancer ([place]) 12/07/2018    Past Surgical History:  Procedure Laterality Date  . ARTHROPLASTY AND TENDON REPAIR LEFT THUMB  JAN 2014  . COLONOSCOPY    . COLONOSCOPY WITH PROPOFOL N/A 08/09/2017   Procedure: COLONOSCOPY WITH PROPOFOL;  Surgeon: Manya Silvas, MD;  Location: [facility] ENDOSCOPY;  Service: Endoscopy;  Laterality: N/A;  . ESOPHAGOGASTRODUODENOSCOPY (EGD) WITH PROPOFOL N/A 08/09/2017   Procedure: ESOPHAGOGASTRODUODENOSCOPY (EGD) WITH PROPOFOL;  Surgeon: Manya Silvas, MD;  Location[facility] ENDOSCOPY;  Service: Endoscopy;  Laterality: N/A;  . GREEN LIGHT LASER TURP (TRANSURETHRAL RESECTION OF PROSTATE N/A 12/14/2015   Procedure: GREEN LIGHT LASER TURP (TRANSURETHRAL RESECTION OF PROSTATE) LASER OF BLADDER NECK CONTRACTURE;  Surgeon: Carolan Clines, MD;  Location: [place];  Service: Urology;  Laterality: N/A;  . PENILE PROSTHESIS IMPLANT N/A 12/06/2013   Procedure: PENILE  PROTHESIS INFLATABLE;  Surgeon: Ailene Rud, MD;  Location: [facility];  Service: Urology;  Laterality: N/A;  . PROSTATE CRYOABLATION  06-01-2012  [facility]  . TRANSURETHRAL RESECTION OF BLADDER NECK N/A 03/20/2015   Procedure: RELEASE BLADDER NECK CONTRACTURE  WITH WOLF BUTTON ELECTRODE ;  Surgeon: Carolan Clines, MD;  Location: [place];  Service: Urology;  Laterality: N/A;  . TRANSURETHRAL RESECTION OF BLADDER TUMOR N/A 12/05/2018   Procedure: TRANSURETHRAL RESECTION OF BLADDER TUMOR (TURBT)/CYSTOSCOPY/  INSTILLATION OF Rodell Perna;  Surgeon: Ceasar Mons, MD;  Location: [facility];  Service: Urology;   Laterality: N/A;  . TRANSURETHRAL RESECTION OF BLADDER TUMOR N/A 01/15/2020   Procedure: TRANSURETHRAL RESECTION OF BLADDER TUMOR (TURBT)/ CYSTOSCOPY;  Surgeon: Ceasar Mons, MD;  Location: [facility];  Service: Urology;  Laterality: N/A;  . TRIGGER FINGER RELEASE Left 10/2015  . ulner nerve neuropathy         Home Medications    Prior to Admission medications   Medication Sig Start Date End Date Taking? Authorizing Provider  amLODipine (NORVASC) 5 MG tablet Take 5 mg by mouth daily.   Yes [provider]  benazepril (LOTENSIN) 20 MG tablet Take 20 mg by mouth daily.   Yes [provider]  cetirizine (ZYRTEC) 10 MG tablet Take 10 mg by mouth 2 (two) times daily.    Yes [provider]  cholecalciferol (VITAMIN D3) 25 MCG (1000 UT) tablet Take 1,000 Units by mouth daily.   Yes [provider]  Chromium 1 MG CAPS Take 1 mg by mouth daily. 1 per day    Yes [provider]  etodolac (LODINE) 400 MG tablet Take 400 mg by mouth 4 (four) times daily. Etodolac takes 200 mg qid for nerve pain   Yes [provider]  Glucosamine-Chondroit-Vit C-Mn (GLUCOSAMINE CHONDR 1500 COMPLX PO) Take 1 tablet by mouth daily.   Yes [provider]  levothyroxine (SYNTHROID) 75 MCG tablet Take by mouth. 04/20/20 04/20/21 Yes [provider]  Multiple Vitamin (MULTIVITAMIN) tablet Take 1 tablet by mouth daily.   Yes [provider]  niacin 500 MG tablet Take 500 mg by mouth at bedtime.   Yes [provider]  omeprazole (PRILOSEC) 40 MG capsule Take 40 mg by mouth daily.  08/27/16  Yes [provider]  potassium phosphate, monobasic, (K-PHOS ORIGINAL) 500 MG tablet Take 500 mg by mouth daily.   Yes [provider]  testosterone cypionate (DEPOTESTOSTERONE CYPIONATE) 200 MG/ML injection Inject 200 mg into the muscle every 14 (fourteen) days.  08/03/16  Yes [provider]  TURMERIC  PO Take 1 Dose by mouth daily.    Yes [provider]  warfarin (COUMADIN) 4 MG tablet Take 4 mg by mouth daily. 03/25/20  Yes [provider]  acetaminophen (TYLENOL) 500 MG tablet Take 500 mg by mouth every 6 (six) hours as needed.    [provider]  baclofen (LIORESAL) 20 MG tablet Take 1 tablet (20 mg total) by mouth 3 (three) times daily for 7 days. 06/05/20 06/12/20  Danton Clap, PA-C  phenazopyridine (PYRIDIUM) 200 MG tablet Take 1 tablet (200 mg total) by mouth 3 (three) times daily as needed (for pain with urination). 01/15/20 01/14/21  Ceasar Mons, MD  traMADol (ULTRAM) 50 MG tablet Take 1 tablet (50 mg total) by mouth every 6 (six) hours as needed for up to 5 days. 06/05/20 06/10/20  Danton Clap, PA-C  Family History History reviewed. No pertinent family history.  Social History Social History   Tobacco Use  . Smoking status: Never Smoker  . Smokeless tobacco: Never Used  Vaping Use  . Vaping Use: Never used  Substance Use Topics  . Alcohol use: No  . Drug use: No     Allergies   Doxazosin   Review of Systems Review of Systems  Constitutional: Negative for fatigue and fever.  Gastrointestinal: Negative for nausea and vomiting.  Musculoskeletal: Positive for myalgias. Negative for arthralgias, back pain, gait problem and joint swelling.  Skin: Negative for color change, rash and wound.  Neurological: Negative for weakness, numbness and headaches.  Hematological: Negative for adenopathy.     Physical Exam Triage Vital Signs ED Triage Vitals [06/05/20 1718]  Enc Vitals Group     BP      Pulse      Resp      Temp      Temp src      SpO2      Weight 165 lb (74.8 kg)     Height 5\' 9"  (1.753 m)     Head Circumference      Peak Flow      Pain Score 9     Pain Loc      Pain Edu?      Excl. in [place]?    No data found.  Updated Vital Signs BP (!) 135/94 (BP Location: Left Arm)   Pulse (!) 58   Temp 98.6 F (37  C) (Oral)   Resp 16   Ht 5\' 9"  (1.753 m)   Wt 165 lb (74.8 kg)   SpO2 99%   BMI 24.37 kg/m      Physical Exam Vitals and nursing note reviewed.  Constitutional:      General: He is not in acute distress.    Appearance: Normal appearance. He is well-developed and normal weight. He is not toxic-appearing.  HENT:     Head: Normocephalic and atraumatic.  Eyes:     General: No scleral icterus.    Conjunctiva/sclera: Conjunctivae normal.  Cardiovascular:     Rate and Rhythm: Normal rate and regular rhythm.     Pulses: Normal pulses.     Heart sounds: No murmur heard.   Pulmonary:     Effort: Pulmonary effort is normal. No respiratory distress.     Breath sounds: Normal breath sounds.  Abdominal:     Palpations: Abdomen is soft.  Musculoskeletal:     Cervical back: Neck supple.     Thoracic back: Normal.     Lumbar back: Normal.     Right lower leg: Tenderness (diffuse TTP lateral thigh/quad, mild TTP bilateral calves) present.     Left lower leg: Tenderness present.     Comments: Full ROM of hip and back without pain., 5/5 strength bilat LEs  Skin:    General: Skin is warm and dry.  Neurological:     General: No focal deficit present.     Mental Status: He is alert. Mental status is at baseline.     Motor: No weakness.     Gait: Gait normal.  Psychiatric:        Mood and Affect: Mood normal.        Behavior: Behavior normal.        Thought Content: Thought content normal.      UC Treatments / Results  Labs (all labs ordered are listed, but only abnormal results are displayed) Labs  Reviewed - No data to display  EKG   Radiology No results found.  Procedures Procedures (including critical care time)  Medications Ordered in UC Medications - No data to display  Initial Impression / Assessment and Plan / UC Course  I have reviewed the triage vital signs and the nursing notes.  Pertinent labs & imaging results that were available during my care of the  patient were reviewed by me and considered in my medical decision making (see chart for details).   Based on patient's exam condition seems to be consistent with strain of the thigh muscle.  He also seems to be having some muscle cramps and spasms.  Sent baclofen for this.  Advised him to continue his diclofenac and he can take Ultram if needed for pain.  Advised him to try heat and topical muscle rubs.  Monitor warfarin while taking Ultram.  Advised him to follow-up with Ortho if not feeling better over the next week.  Follow-up with [place] for any new or worsening symptoms.  He is on warfarin and has that checked regularly so there is not really any concern for DVT with that taken into account as well as how his pain is on his exam.  However, I did advise him that if the pain worsens he needs to be seen again immediately.  Final Clinical Impressions(s) / UC Diagnoses   Final diagnoses:  Muscle strain of left thigh, initial encounter     Discharge Instructions     Continue your diclofenac.  Try applying heat to see if it will help.  Try to perform gentle stretches.  Lay flat if that seems to improve the pain.  Take muscle relaxer as needed as prescribed.  Take pain medicine if absolutely needed.  If not starting to improve over the next week I would suggest you following up with orthopedics.  You have a condition requiring you to follow up with Orthopedics so please call one of the following office for appointment:   [facility] [address] [place], [place] [address] Phone: [phone]  [facility] [address] [place], [place] [address] Phone: [phone]     ED Prescriptions    Medication Sig Dispense Auth. Provider   traMADol (ULTRAM) 50 MG tablet Take 1 tablet (50 mg total) by mouth every 6 (six) hours as needed for up to 5 days. 15 tablet Laurene Footman B, PA-C   baclofen (LIORESAL) 20 MG tablet Take 1 tablet (20 mg total) by mouth 3 (three) times daily for 7 days. 21  tablet Danton Clap, PA-C     I have reviewed the PDMP during this encounter.   Danton Clap, PA-C 06/05/20 1800

## 2020-06-05 NOTE — Discharge Instructions (Signed)
Continue your diclofenac.  Try applying heat to see if it will help.  Try to perform gentle stretches.  Lay flat if that seems to improve the pain.  Take muscle relaxer as needed as prescribed.  Take pain medicine if absolutely needed.  If not starting to improve over the next week I would suggest you following up with orthopedics.  You have a condition requiring you to follow up with Orthopedics so please call one of the following office for appointment:   [facility] [address] [place], [place] [address] Phone: [phone]  [facility] [address] [place],  [address] Phone: [phone]

## 2020-06-29 ENCOUNTER — Ambulatory Visit: Payer: Medicare Other | Admitting: Dermatology

## 2020-06-29 ENCOUNTER — Other Ambulatory Visit: Payer: Self-pay

## 2020-06-29 ENCOUNTER — Encounter: Payer: Self-pay | Admitting: Dermatology

## 2020-06-29 DIAGNOSIS — L578 Other skin changes due to chronic exposure to nonionizing radiation: Secondary | ICD-10-CM

## 2020-06-29 DIAGNOSIS — Z8551 Personal history of malignant neoplasm of bladder: Secondary | ICD-10-CM

## 2020-06-29 DIAGNOSIS — D1800 Hemangioma unspecified site: Secondary | ICD-10-CM

## 2020-06-29 DIAGNOSIS — Z1283 Encounter for screening for malignant neoplasm of skin: Secondary | ICD-10-CM | POA: Diagnosis not present

## 2020-06-29 DIAGNOSIS — D229 Melanocytic nevi, unspecified: Secondary | ICD-10-CM | POA: Diagnosis not present

## 2020-06-29 DIAGNOSIS — L821 Other seborrheic keratosis: Secondary | ICD-10-CM

## 2020-06-29 DIAGNOSIS — L820 Inflamed seborrheic keratosis: Secondary | ICD-10-CM | POA: Diagnosis not present

## 2020-06-29 DIAGNOSIS — Z8546 Personal history of malignant neoplasm of prostate: Secondary | ICD-10-CM

## 2020-06-29 DIAGNOSIS — Z85828 Personal history of other malignant neoplasm of skin: Secondary | ICD-10-CM | POA: Diagnosis not present

## 2020-06-29 DIAGNOSIS — G629 Polyneuropathy, unspecified: Secondary | ICD-10-CM

## 2020-06-29 DIAGNOSIS — L814 Other melanin hyperpigmentation: Secondary | ICD-10-CM

## 2020-06-29 NOTE — Progress Notes (Signed)
   Follow-Up Visit   Subjective  Phillip [name] is a 74 y.o. male who presents for the following: Annual Exam (History of BCC TBSE today). The patient presents for Total-Body Skin Exam (TBSE) for skin cancer screening and mole check.  The following portions of the chart were reviewed this encounter and updated as appropriate:  Tobacco  Allergies  Meds  Problems  Med Hx  Surg Hx  Fam Hx     Review of Systems:  No other skin or systemic complaints except as noted in HPI or Assessment and Plan.  Objective  Well appearing patient in no apparent distress; mood and affect are within normal limits.  A full examination was performed including scalp, head, eyes, ears, nose, lips, neck, chest, axillae, abdomen, back, buttocks, bilateral upper extremities, bilateral lower extremities, hands, feet, fingers, toes, fingernails, and toenails. All findings within normal limits unless otherwise noted below.  Objective  Back x 3, right shoulder x 2 (5): Erythematous keratotic or waxy stuck-on papule or plaque.    Assessment & Plan    Lentigines - Scattered tan macules - Discussed due to sun exposure - Benign, observe - Call for any changes  Seborrheic Keratoses - Stuck-on, waxy, tan-brown papules and plaques  - Discussed benign etiology and prognosis. - Observe - Call for any changes  Melanocytic Nevi - Tan-brown and/or pink-flesh-colored symmetric macules and papules - Benign appearing on exam today - Observation - Call clinic for new or changing moles - Recommend daily use of broad spectrum spf 30+ sunscreen to sun-exposed areas.   Hemangiomas - Red papules - Discussed benign nature - Observe - Call for any changes  Actinic Damage - diffuse scaly erythematous macules with underlying dyspigmentation - Recommend daily broad spectrum sunscreen SPF 30+ to sun-exposed areas, reapply every 2 hours as needed.  - Call for new or changing lesions.  Skin cancer screening  performed today.  History of Basal Cell Carcinoma of the Skin - No evidence of recurrence today - Recommend regular full body skin exams - Recommend daily broad spectrum sunscreen SPF 30+ to sun-exposed areas, reapply every 2 hours as needed.  - Call if any new or changing lesions are noted between office visits  History of prostate and Bladder cancers Follow up with PCP  Neuropathy of legs/feet Follow up with PCP  Inflamed seborrheic keratosis (5) Back x 3, right shoulder x 2  Destruction of lesion - Back x 3, right shoulder x 2 Complexity: simple   Destruction method: cryotherapy   Informed consent: discussed and consent obtained   Timeout:  patient name, date of birth, surgical site, and procedure verified Lesion destroyed using liquid nitrogen: Yes   Region frozen until ice ball extended beyond lesion: Yes   Outcome: patient tolerated procedure well with no complications   Post-procedure details: wound care instructions given    Skin cancer screening  Return in about 1 year (around 06/29/2021).   I, Ashok Cordia, CMA, am acting as scribe for Sarina Ser, MD .  Documentation: I have reviewed the above documentation for accuracy and completeness, and I agree with the above.  Sarina Ser, MD '

## 2020-06-29 NOTE — Patient Instructions (Signed)

## 2020-07-22 ENCOUNTER — Other Ambulatory Visit: Payer: Self-pay

## 2020-07-22 ENCOUNTER — Encounter: Payer: Self-pay | Admitting: Podiatry

## 2020-07-22 ENCOUNTER — Ambulatory Visit (INDEPENDENT_AMBULATORY_CARE_PROVIDER_SITE_OTHER): Payer: Medicare Other

## 2020-07-22 ENCOUNTER — Ambulatory Visit: Payer: Medicare Other | Admitting: Podiatry

## 2020-07-22 ENCOUNTER — Other Ambulatory Visit: Payer: Self-pay | Admitting: Podiatry

## 2020-07-22 DIAGNOSIS — Q828 Other specified congenital malformations of skin: Secondary | ICD-10-CM

## 2020-07-22 DIAGNOSIS — M7752 Other enthesopathy of left foot: Secondary | ICD-10-CM

## 2020-07-22 DIAGNOSIS — R2240 Localized swelling, mass and lump, unspecified lower limb: Secondary | ICD-10-CM

## 2020-07-22 NOTE — Progress Notes (Signed)
Subjective:  Patient ID: Phillip [name], male    DOB: 10/12/1945,  MRN: [id number] HPI Chief Complaint  Patient presents with  . Foot Pain    Sub 5th met left - tender, callused x 2 weeks, walking a lot makes worse, concerned it may be another tumor  . New Patient (Initial Visit)    Est pt 12/2016    74 y.o. male presents with the above complaint.   ROS: Denies fever chills nausea vomiting muscle aches pains calf pain back pain chest pain shortness of breath.  Past Medical History:  Diagnosis Date  . Arthritis   . Basal cell carcinoma    L clavicle, L prox forearm- removed years ago   . Bladder cancer ([place])   . Bladder neck contracture   . Cataract   . DVT (deep venous thrombosis) ([place]) 11/25/2018   right le  . Dyslipidemia   . ED (erectile dysfunction)   . Frequency   . GERD (gastroesophageal reflux disease)   . History of pulmonary embolus (PE)   . Hypertension   . Incontinence of urine    sui, s/p cryoablation  . Kidney stones   . Nocturia   . Prostate cancer ([place])    S/P   CRYOABLATION  . Right elbow tendinitis   . Wears glasses    Past Surgical History:  Procedure Laterality Date  . ARTHROPLASTY AND TENDON REPAIR LEFT THUMB  JAN 2014  . COLONOSCOPY    . COLONOSCOPY WITH PROPOFOL N/A 08/09/2017   Procedure: COLONOSCOPY WITH PROPOFOL;  Surgeon: Manya Silvas, MD;  Location: [facility] ENDOSCOPY;  Service: Endoscopy;  Laterality: N/A;  . ESOPHAGOGASTRODUODENOSCOPY (EGD) WITH PROPOFOL N/A 08/09/2017   Procedure: ESOPHAGOGASTRODUODENOSCOPY (EGD) WITH PROPOFOL;  Surgeon: Manya Silvas, MD;  Location: [facility] ENDOSCOPY;  Service: Endoscopy;  Laterality: N/A;  . GREEN LIGHT LASER TURP (TRANSURETHRAL RESECTION OF PROSTATE N/A 12/14/2015   Procedure: GREEN LIGHT LASER TURP (TRANSURETHRAL RESECTION OF PROSTATE) LASER OF BLADDER NECK CONTRACTURE;  Surgeon: Carolan Clines, MD;  Location: [facility];  Service: Urology;  Laterality: N/A;  . PENILE  PROSTHESIS IMPLANT N/A 12/06/2013   Procedure: PENILE PROTHESIS INFLATABLE;  Surgeon: Ailene Rud, MD;  Location: [facility];  Service: Urology;  Laterality: N/A;  . PROSTATE CRYOABLATION  06-01-2012  [facility]  . TRANSURETHRAL RESECTION OF BLADDER NECK N/A 03/20/2015   Procedure: RELEASE BLADDER NECK CONTRACTURE  WITH WOLF BUTTON ELECTRODE ;  Surgeon: Carolan Clines, MD;  Location: [place];  Service: Urology;  Laterality: N/A;  . TRANSURETHRAL RESECTION OF BLADDER TUMOR N/A 12/05/2018   Procedure: TRANSURETHRAL RESECTION OF BLADDER TUMOR (TURBT)/CYSTOSCOPY/  INSTILLATION OF Rodell Perna;  Surgeon: Ceasar Mons, MD;  Location: [facility];  Service: Urology;  Laterality: N/A;  . TRANSURETHRAL RESECTION OF BLADDER TUMOR N/A 01/15/2020   Procedure: TRANSURETHRAL RESECTION OF BLADDER TUMOR (TURBT)/ CYSTOSCOPY;  Surgeon: Ceasar Mons, MD;  Location: [facility];  Service: Urology;  Laterality: N/A;  . TRIGGER FINGER RELEASE Left 10/2015  . ulner nerve neuropathy      Current Outpatient Medications:  .  acetaminophen (TYLENOL) 500 MG tablet, Take 500 mg by mouth every 6 (six) hours as needed., Disp: , Rfl:  .  amLODipine (NORVASC) 5 MG tablet, Take 5 mg by mouth daily., Disp: , Rfl:  .  benazepril (LOTENSIN) 20 MG tablet, Take 20 mg by mouth daily., Disp: , Rfl:  .  cetirizine (ZYRTEC) 10 MG tablet, Take 10 mg by  mouth 2 (two) times daily. , Disp: , Rfl:  .  cholecalciferol (VITAMIN D3) 25 MCG (1000 UT) tablet, Take 1,000 Units by mouth daily., Disp: , Rfl:  .  Chromium 1 MG CAPS, Take 1 mg by mouth daily. 1 per day , Disp: , Rfl:  .  diclofenac (VOLTAREN) 75 MG EC tablet, Take 75 mg by mouth 2 (two) times daily., Disp: , Rfl:  .  etodolac (LODINE) 400 MG tablet, Take 400 mg by mouth 4 (four) times daily. Etodolac takes 200 mg qid for nerve pain, Disp: , Rfl:  .  gabapentin (NEURONTIN) 300 MG capsule, Take  300 mg by mouth at bedtime., Disp: , Rfl:  .  Glucosamine-Chondroit-Vit C-Mn (GLUCOSAMINE CHONDR 1500 COMPLX PO), Take 1 tablet by mouth daily., Disp: , Rfl:  .  levothyroxine (SYNTHROID) 75 MCG tablet, Take by mouth., Disp: , Rfl:  .  Multiple Vitamin (MULTIVITAMIN) tablet, Take 1 tablet by mouth daily., Disp: , Rfl:  .  MYRBETRIQ 50 MG TB24 tablet, Take 50 mg by mouth daily., Disp: , Rfl:  .  niacin 500 MG tablet, Take 500 mg by mouth at bedtime., Disp: , Rfl:  .  omeprazole (PRILOSEC) 40 MG capsule, Take 40 mg by mouth daily. , Disp: , Rfl:  .  phenazopyridine (PYRIDIUM) 200 MG tablet, Take 1 tablet (200 mg total) by mouth 3 (three) times daily as needed (for pain with urination)., Disp: 30 tablet, Rfl: 0 .  potassium phosphate, monobasic, (K-PHOS ORIGINAL) 500 MG tablet, Take 500 mg by mouth daily., Disp: , Rfl:  .  sertraline (ZOLOFT) 50 MG tablet, Take 50 mg by mouth daily., Disp: , Rfl:  .  testosterone cypionate (DEPOTESTOSTERONE CYPIONATE) 200 MG/ML injection, Inject 200 mg into the muscle every 14 (fourteen) days. , Disp: , Rfl:  .  TURMERIC PO, Take 1 Dose by mouth daily. , Disp: , Rfl:  .  venlafaxine XR (EFFEXOR-XR) 37.5 MG 24 hr capsule, Take 37.5 mg by mouth daily., Disp: , Rfl:  .  warfarin (COUMADIN) 4 MG tablet, Take 4 mg by mouth daily., Disp: , Rfl:   Allergies  Allergen Reactions  . Doxazosin Other (See Comments)    Other reaction(s): Unknown   Review of Systems Objective:  There were no vitals filed for this visit.  General: Well developed, nourished, in no acute distress, alert and oriented x3   Dermatological: Skin is warm, dry and supple bilateral. Nails x 10 are well maintained; remaining integument appears unremarkable at this time. There are no open sores, no preulcerative lesions, no rash or signs of infection present.  Painful porokeratosis.  Vascular: Dorsalis Pedis artery and Posterior Tibial artery pedal pulses are 2/4 bilateral with immedate capillary  fill time. Pedal hair growth present. No varicosities and no lower extremity edema present bilateral.   Neruologic: Grossly intact via light touch bilateral. Vibratory intact via tuning fork bilateral. Protective threshold with Semmes Wienstein monofilament intact to all pedal sites bilateral. Patellar and Achilles deep tendon reflexes 2+ bilateral. No Babinski or clonus noted bilateral.   Musculoskeletal: No gross boney pedal deformities bilateral. No pain, crepitus, or limitation noted with foot and ankle range of motion bilateral. Muscular strength 5/5 in all groups tested bilateral.  Painful fifth metatarsophalangeal joint with underlying area of fluctuance that is painful on palpation.  Gait: Unassisted, Nonantalgic.    Radiographs:  Radiographs taken today demonstrate an osseously mature individual no large soft tissue masses identified.  No acute findings noted.  Assessment & Plan:  Assessment: Bursitis fifth metatarsal left foot.  Plan: I injected the bursa today with 2 mg of dexamethasone local anesthetic tolerated procedure well without complications discussed appropriate shoe gear stretching exercises and ice therapy.     Myha Arizpe T. [place], [place]

## 2020-08-03 ENCOUNTER — Ambulatory Visit: Payer: Medicare Other | Admitting: Podiatry

## 2020-08-03 ENCOUNTER — Encounter: Payer: Self-pay | Admitting: Podiatry

## 2020-08-03 ENCOUNTER — Other Ambulatory Visit: Payer: Self-pay

## 2020-08-03 DIAGNOSIS — M7752 Other enthesopathy of left foot: Secondary | ICD-10-CM

## 2020-08-03 NOTE — Progress Notes (Signed)
  Subjective:  Patient ID: Phillip [name], male    DOB: 06/09/1946,  MRN: [id number]  Chief Complaint  Patient presents with  . Foot Pain    "the injection Dr. Milinda Pointer gave me did not work.  It still hurts really bad and sometimes I can hardly walk"    74 y.o. male presents with the above complaint. History confirmed with patient.  Pain returned and has not gotten any better.  He localizes it to the base of the fifth toe  Objective:  Physical Exam: warm, good capillary refill, no trophic changes or ulcerative lesions and normal DP and PT pulses. Left Foot: His pain on palpation now not on the plantar lateral fifth metatarsal head or joint but at the base of the second toe along the flexor tendon  Radiographs: X-ray of the left foot: Prior x-rays reviewed, no evidence of fracture Assessment:   1. Bursitis of left foot      Plan:  Patient was evaluated and treated and all questions answered.  Discussed with him that it may be that he just needs 1 more injection to alleviate pain directly in the area of maximal tenderness.  He thinks this is reasonable as well.  After sterile prep with alcohol 4 g of dexamethasone was injected along the flexor tendon sheath as well as the base of the fifth toe.  Also recommended that he splint the toe in a buddy splint fashion with Coban which I demonstrated to him.  Offloaded the toes well with a silicone toe crest pad  Return if symptoms worsen or fail to improve.

## 2020-11-02 ENCOUNTER — Other Ambulatory Visit: Payer: Self-pay | Admitting: Ophthalmology

## 2020-11-11 ENCOUNTER — Encounter: Payer: Self-pay | Admitting: Ophthalmology

## 2020-11-11 ENCOUNTER — Other Ambulatory Visit: Payer: Self-pay

## 2020-11-12 ENCOUNTER — Other Ambulatory Visit
Admission: RE | Admit: 2020-11-12 | Discharge: 2020-11-12 | Disposition: A | Discharge status: Home / Self Care | Payer: Medicare Other | Source: Ambulatory Visit | Attending: Ophthalmology | Admitting: Ophthalmology

## 2020-11-12 DIAGNOSIS — Z20822 Contact with and (suspected) exposure to covid-19: Secondary | ICD-10-CM | POA: Insufficient documentation

## 2020-11-12 DIAGNOSIS — Z01812 Encounter for preprocedural laboratory examination: Secondary | ICD-10-CM | POA: Diagnosis present

## 2020-11-12 NOTE — Discharge Instructions (Signed)

## 2020-11-13 LAB — SARS CORONAVIRUS 2 (TAT 6-24 HRS): SARS Coronavirus 2: NEGATIVE

## 2020-11-16 ENCOUNTER — Other Ambulatory Visit: Payer: Self-pay

## 2020-11-16 ENCOUNTER — Encounter
Admission: RE | Disposition: A | Discharge status: Home / Self Care | Payer: Self-pay | Source: Home / Self Care | Attending: Ophthalmology

## 2020-11-16 ENCOUNTER — Encounter: Payer: Self-pay | Admitting: Ophthalmology

## 2020-11-16 ENCOUNTER — Ambulatory Visit: Payer: Medicare Other | Admitting: Anesthesiology

## 2020-11-16 ENCOUNTER — Ambulatory Visit
Admission: RE | Admit: 2020-11-16 | Discharge: 2020-11-16 | Disposition: A | Discharge status: Home / Self Care | Payer: Medicare Other | Attending: Ophthalmology | Admitting: Ophthalmology

## 2020-11-16 DIAGNOSIS — Z79899 Other long term (current) drug therapy: Secondary | ICD-10-CM | POA: Diagnosis not present

## 2020-11-16 DIAGNOSIS — H2512 Age-related nuclear cataract, left eye: Secondary | ICD-10-CM | POA: Insufficient documentation

## 2020-11-16 DIAGNOSIS — Z79890 Hormone replacement therapy (postmenopausal): Secondary | ICD-10-CM | POA: Insufficient documentation

## 2020-11-16 DIAGNOSIS — Z7901 Long term (current) use of anticoagulants: Secondary | ICD-10-CM | POA: Diagnosis not present

## 2020-11-16 DIAGNOSIS — H2181 Floppy iris syndrome: Secondary | ICD-10-CM | POA: Diagnosis not present

## 2020-11-16 HISTORY — DX: Polyneuropathy, unspecified: G62.9

## 2020-11-16 HISTORY — PX: CATARACT EXTRACTION W/PHACO: SHX586

## 2020-11-16 HISTORY — DX: Dizziness and giddiness: R42

## 2020-11-16 HISTORY — DX: Hypothyroidism, unspecified: E03.9

## 2020-11-16 SURGERY — PHACOEMULSIFICATION, CATARACT, WITH IOL INSERTION
Anesthesia: Monitor Anesthesia Care | Site: Eye | Laterality: Left

## 2020-11-16 MED ORDER — LACTATED RINGERS IV SOLN: INTRAVENOUS | Status: DC

## 2020-11-16 MED ORDER — EPINEPHRINE PF 1 MG/ML IJ SOLN
INTRAOCULAR | Status: DC | PRN
  Administered 2020-11-16: 72 mL via OPHTHALMIC

## 2020-11-16 MED ORDER — ARMC OPHTHALMIC DILATING DROPS
1.0000 "application " | OPHTHALMIC | Status: AC | PRN
  Administered 2020-11-16 (×3): 1 via OPHTHALMIC

## 2020-11-16 MED ORDER — LIDOCAINE HCL (PF) 2 % IJ SOLN
INTRAOCULAR | Status: DC | PRN
  Administered 2020-11-16: 1 mL

## 2020-11-16 MED ORDER — BRIMONIDINE TARTRATE-TIMOLOL 0.2-0.5 % OP SOLN
OPHTHALMIC | Status: DC | PRN
  Administered 2020-11-16: 1 [drp] via OPHTHALMIC

## 2020-11-16 MED ORDER — TETRACAINE HCL 0.5 % OP SOLN
1.0000 [drp] | OPHTHALMIC | Status: DC | PRN
  Administered 2020-11-16 (×3): 1 [drp] via OPHTHALMIC

## 2020-11-16 MED ORDER — FENTANYL CITRATE (PF) 100 MCG/2ML IJ SOLN
INTRAMUSCULAR | Status: DC | PRN
  Administered 2020-11-16: 50 ug via INTRAVENOUS

## 2020-11-16 MED ORDER — CEFUROXIME OPHTHALMIC INJECTION 1 MG/0.1 ML
INJECTION | OPHTHALMIC | Status: DC | PRN
  Administered 2020-11-16: 0.1 mL via INTRACAMERAL

## 2020-11-16 MED ORDER — MIDAZOLAM HCL 2 MG/2ML IJ SOLN
INTRAMUSCULAR | Status: DC | PRN
  Administered 2020-11-16: 1 mg via INTRAVENOUS

## 2020-11-16 MED ORDER — NA HYALUR & NA CHOND-NA HYALUR 0.4-0.35 ML IO KIT
PACK | INTRAOCULAR | Status: DC | PRN
  Administered 2020-11-16: 1 mL via INTRAOCULAR

## 2020-11-16 SURGICAL SUPPLY — 22 items
CANNULA ANT/CHMB 27G (MISCELLANEOUS) ×1 IMPLANT
CANNULA ANT/CHMB 27GA (MISCELLANEOUS) ×2 IMPLANT
GLOVE SURG LX 7.5 STRW (GLOVE) ×1
GLOVE SURG LX STRL 7.5 STRW (GLOVE) ×1 IMPLANT
GLOVE SURG TRIUMPH 8.0 PF LTX (GLOVE) ×2 IMPLANT
GOWN STRL REUS W/ TWL LRG LVL3 (GOWN DISPOSABLE) ×2 IMPLANT
GOWN STRL REUS W/TWL LRG LVL3 (GOWN DISPOSABLE) ×4
LENS IOL TECNIS EYHANCE 15.0 (Intraocular Lens) ×1 IMPLANT
MARKER SKIN DUAL TIP RULER LAB (MISCELLANEOUS) ×2 IMPLANT
NDL CAPSULORHEX 25GA (NEEDLE) ×1 IMPLANT
NDL FILTER BLUNT 18X1 1/2 (NEEDLE) ×2 IMPLANT
NEEDLE CAPSULORHEX 25GA (NEEDLE) ×2 IMPLANT
NEEDLE FILTER BLUNT 18X 1/2SAF (NEEDLE) ×2
NEEDLE FILTER BLUNT 18X1 1/2 (NEEDLE) ×2 IMPLANT
PACK CATARACT BRASINGTON (MISCELLANEOUS) ×2 IMPLANT
PACK EYE AFTER SURG (MISCELLANEOUS) ×2 IMPLANT
PACK OPTHALMIC (MISCELLANEOUS) ×2 IMPLANT
SOLUTION OPHTHALMIC SALT (MISCELLANEOUS) ×2 IMPLANT
SYR 3ML LL SCALE MARK (SYRINGE) ×4 IMPLANT
SYR TB 1ML LUER SLIP (SYRINGE) ×2 IMPLANT
WATER STERILE IRR 250ML POUR (IV SOLUTION) ×2 IMPLANT
WIPE NON LINTING 3.25X3.25 (MISCELLANEOUS) ×2 IMPLANT

## 2020-11-16 NOTE — Anesthesia Preprocedure Evaluation (Signed)
Anesthesia Evaluation  Patient identified by MRN, date of birth, ID band Patient awake    Reviewed: Allergy & Precautions, NPO status , Patient's Chart, lab work & pertinent test results  Airway Mallampati: II       Dental no notable dental hx.    Pulmonary neg pulmonary ROS,    Pulmonary exam normal        Cardiovascular hypertension, Normal cardiovascular exam     Neuro/Psych  Neuromuscular disease (chemo induced peripheral neuropathy) negative psych ROS   GI/Hepatic GERD  Controlled,  Endo/Other  diabetes, Type 2Hypothyroidism   Renal/GU CRFRenal disease     Musculoskeletal  (+) Arthritis , Osteoarthritis,    Abdominal Normal abdominal exam  (+)   Peds  Hematology H/o DVT/PE   Anesthesia Other Findings   Reproductive/Obstetrics                             Anesthesia Physical Anesthesia Plan  ASA: III  Anesthesia Plan: MAC   Post-op Pain Management:    Induction: Intravenous  PONV Risk Score and Plan: 1 and TIVA, Midazolam and Treatment may vary due to age or medical condition  Airway Management Planned: Natural Airway  Additional Equipment: None  Intra-op Plan:   Post-operative Plan:   Informed Consent: I have reviewed the patients History and Physical, chart, labs and discussed the procedure including the risks, benefits and alternatives for the proposed anesthesia with the patient or authorized representative who has indicated his/her understanding and acceptance.     Dental advisory given  Plan Discussed with: CRNA  Anesthesia Plan Comments:         Anesthesia Quick Evaluation

## 2020-11-16 NOTE — Anesthesia Postprocedure Evaluation (Signed)
Anesthesia Post Note  Patient: Phillip [name]  Procedure(s) Performed: CATARACT EXTRACTION PHACO AND INTRAOCULAR LENS PLACEMENT (IOC) LEFT  7.09 00:56.4 12.6% (Left Eye)     Patient location during evaluation: PACU Anesthesia Type: MAC Level of consciousness: awake and alert Pain management: pain level controlled Vital Signs Assessment: post-procedure vital signs reviewed and stable Respiratory status: spontaneous breathing and nonlabored ventilation Postop Assessment: no apparent nausea or vomiting Anesthetic complications: no   No complications documented.  Orval Dortch [employer]

## 2020-11-16 NOTE — Transfer of Care (Signed)
Immediate Anesthesia Transfer of Care Note  Patient: Phillip [name]  Procedure(s) Performed: CATARACT EXTRACTION PHACO AND INTRAOCULAR LENS PLACEMENT (IOC) LEFT  7.09 00:56.4 12.6% (Left Eye)  Patient Location: PACU  Anesthesia Type: MAC  Level of Consciousness: awake, alert  and patient cooperative  Airway and Oxygen Therapy: Patient Spontanous Breathing and Patient connected to supplemental oxygen  Post-op Assessment: Post-op Vital signs reviewed, Patient's Cardiovascular Status Stable, Respiratory Function Stable, Patent Airway and No signs of Nausea or vomiting  Post-op Vital Signs: Reviewed and stable  Complications: No complications documented.

## 2020-11-16 NOTE — Op Note (Signed)
OPERATIVE NOTE  Phillip [name] [id number] 11/16/2020   PREOPERATIVE DIAGNOSIS:  Nuclear sclerotic cataract left eye. 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 Item Serial No. Manufacturer Lot No. LRB No. Used Action  LENS IOL TECNIS EYHANCE 15.0 - [id number] Intraocular Lens LENS IOL TECNIS EYHANCE 15.0 [id number] JOHNSON   Left 1 Implanted      SURGEON:  Wyonia Hough, MD   ANESTHESIA:  Topical with tetracaine drops and 2% Xylocaine jelly, augmented with 1% preservative-free intracameral lidocaine.    COMPLICATIONS:  None.   DESCRIPTION OF PROCEDURE:  The patient was identified in the holding room and transported to the operating room and placed in the supine position under the operating microscope.  The left eye was identified as the operative eye and it was prepped and draped in the usual sterile ophthalmic fashion.   A 1 millimeter clear-corneal paracentesis was made at the 1:30 position.  0.5 ml of preservative-free 1% lidocaine was injected into the anterior chamber.  The anterior chamber was filled with Viscoat viscoelastic.  A 2.4 millimeter keratome was used to make a near-clear corneal incision at the 10:30 position.  .  A curvilinear capsulorrhexis was made with a cystotome and capsulorrhexis forceps.  Balanced salt solution was used to hydrodissect and hydrodelineate the nucleus.   Phacoemulsification was then used in stop and chop fashion to remove the lens nucleus and epinucleus.  The remaining cortex was then removed using the irrigation and aspiration handpiece. Provisc was then placed into the capsular bag to distend  it for lens placement.  A lens was then injected into the capsular bag.  The remaining viscoelastic was aspirated.   Wounds were hydrated with balanced salt solution.  The anterior chamber was inflated to a physiologic pressure with balanced salt solution.  No wound leaks were noted. Cefuroxime 0.1 ml of a 10mg /ml solution was injected into the anterior chamber for a dose of 1 mg of intracameral antibiotic at the completion of the case.   Timolol and Brimonidine drops were applied to the eye.  The patient was taken to the recovery room in stable condition without complications of anesthesia or surgery.  [name] 11/16/2020, 9:01 AM

## 2020-11-16 NOTE — H&P (Signed)

## 2020-11-16 NOTE — Anesthesia Procedure Notes (Signed)
Procedure Name: MAC Date/Time: 11/16/2020 8:45 AM Performed by: Silvana Newness, CRNA Pre-anesthesia Checklist: Patient identified, Emergency Drugs available, Suction available, Patient being monitored and Timeout performed Patient Re-evaluated:Patient Re-evaluated prior to induction Oxygen Delivery Method: Nasal cannula Placement Confirmation: positive ETCO2

## 2020-11-25 ENCOUNTER — Encounter: Payer: Self-pay | Admitting: Ophthalmology

## 2020-11-30 ENCOUNTER — Other Ambulatory Visit
Admission: RE | Admit: 2020-11-30 | Discharge: 2020-11-30 | Disposition: A | Discharge status: Home / Self Care | Payer: Medicare Other | Source: Ambulatory Visit | Attending: Ophthalmology | Admitting: Ophthalmology

## 2020-11-30 ENCOUNTER — Other Ambulatory Visit: Payer: Self-pay

## 2020-11-30 DIAGNOSIS — Z20822 Contact with and (suspected) exposure to covid-19: Secondary | ICD-10-CM | POA: Diagnosis not present

## 2020-11-30 DIAGNOSIS — Z01812 Encounter for preprocedural laboratory examination: Secondary | ICD-10-CM | POA: Insufficient documentation

## 2020-11-30 LAB — SARS CORONAVIRUS 2 (TAT 6-24 HRS): SARS Coronavirus 2: NEGATIVE

## 2020-12-01 NOTE — Discharge Instructions (Signed)

## 2020-12-02 ENCOUNTER — Ambulatory Visit: Payer: Medicare Other | Admitting: Anesthesiology

## 2020-12-02 ENCOUNTER — Other Ambulatory Visit: Payer: Self-pay

## 2020-12-02 ENCOUNTER — Encounter
Admission: RE | Disposition: A | Discharge status: Home / Self Care | Payer: Self-pay | Source: Ambulatory Visit | Attending: Ophthalmology

## 2020-12-02 ENCOUNTER — Encounter: Payer: Self-pay | Admitting: Ophthalmology

## 2020-12-02 ENCOUNTER — Ambulatory Visit
Admission: RE | Admit: 2020-12-02 | Discharge: 2020-12-02 | Disposition: A | Discharge status: Home / Self Care | Payer: Medicare Other | Source: Ambulatory Visit | Attending: Ophthalmology | Admitting: Ophthalmology

## 2020-12-02 DIAGNOSIS — Z8546 Personal history of malignant neoplasm of prostate: Secondary | ICD-10-CM | POA: Insufficient documentation

## 2020-12-02 DIAGNOSIS — H2511 Age-related nuclear cataract, right eye: Secondary | ICD-10-CM | POA: Diagnosis not present

## 2020-12-02 DIAGNOSIS — Z86711 Personal history of pulmonary embolism: Secondary | ICD-10-CM | POA: Insufficient documentation

## 2020-12-02 DIAGNOSIS — Z79890 Hormone replacement therapy (postmenopausal): Secondary | ICD-10-CM | POA: Insufficient documentation

## 2020-12-02 DIAGNOSIS — Z9221 Personal history of antineoplastic chemotherapy: Secondary | ICD-10-CM | POA: Diagnosis not present

## 2020-12-02 DIAGNOSIS — Z79899 Other long term (current) drug therapy: Secondary | ICD-10-CM | POA: Insufficient documentation

## 2020-12-02 DIAGNOSIS — Z8551 Personal history of malignant neoplasm of bladder: Secondary | ICD-10-CM | POA: Insufficient documentation

## 2020-12-02 DIAGNOSIS — Z7901 Long term (current) use of anticoagulants: Secondary | ICD-10-CM | POA: Insufficient documentation

## 2020-12-02 HISTORY — PX: CATARACT EXTRACTION W/PHACO: SHX586

## 2020-12-02 SURGERY — PHACOEMULSIFICATION, CATARACT, WITH IOL INSERTION
Anesthesia: Monitor Anesthesia Care | Site: Eye | Laterality: Right

## 2020-12-02 MED ORDER — CEFUROXIME OPHTHALMIC INJECTION 1 MG/0.1 ML
INJECTION | OPHTHALMIC | Status: DC | PRN
  Administered 2020-12-02: 0.1 mL via INTRACAMERAL

## 2020-12-02 MED ORDER — LIDOCAINE HCL (PF) 2 % IJ SOLN
INTRAOCULAR | Status: DC | PRN
  Administered 2020-12-02: 1 mL

## 2020-12-02 MED ORDER — ARMC OPHTHALMIC DILATING DROPS
1.0000 "application " | OPHTHALMIC | Status: DC | PRN
  Administered 2020-12-02 (×3): 1 via OPHTHALMIC

## 2020-12-02 MED ORDER — TETRACAINE HCL 0.5 % OP SOLN
1.0000 [drp] | OPHTHALMIC | Status: DC | PRN
  Administered 2020-12-02 (×2): 1 [drp] via OPHTHALMIC

## 2020-12-02 MED ORDER — NA HYALUR & NA CHOND-NA HYALUR 0.4-0.35 ML IO KIT
PACK | INTRAOCULAR | Status: DC | PRN
  Administered 2020-12-02: 1 mL via INTRAOCULAR

## 2020-12-02 MED ORDER — EPINEPHRINE PF 1 MG/ML IJ SOLN
INTRAOCULAR | Status: DC | PRN
  Administered 2020-12-02: 71 mL via OPHTHALMIC

## 2020-12-02 MED ORDER — FENTANYL CITRATE (PF) 100 MCG/2ML IJ SOLN
INTRAMUSCULAR | Status: DC | PRN
  Administered 2020-12-02: 50 ug via INTRAVENOUS

## 2020-12-02 MED ORDER — BRIMONIDINE TARTRATE-TIMOLOL 0.2-0.5 % OP SOLN
OPHTHALMIC | Status: DC | PRN
  Administered 2020-12-02: 1 [drp] via OPHTHALMIC

## 2020-12-02 MED ORDER — MIDAZOLAM HCL 2 MG/2ML IJ SOLN
INTRAMUSCULAR | Status: DC | PRN
  Administered 2020-12-02: 1 mg via INTRAVENOUS

## 2020-12-02 SURGICAL SUPPLY — 20 items
CANNULA ANT/CHMB 27G (MISCELLANEOUS) ×1 IMPLANT
CANNULA ANT/CHMB 27GA (MISCELLANEOUS) ×2 IMPLANT
GLOVE SURG TRIUMPH 8.0 PF LTX (GLOVE) ×2 IMPLANT
GOWN STRL REUS W/ TWL LRG LVL3 (GOWN DISPOSABLE) ×2 IMPLANT
GOWN STRL REUS W/TWL LRG LVL3 (GOWN DISPOSABLE) ×4
LENS IOL TECNIS EYHANCE 16.5 (Intraocular Lens) ×1 IMPLANT
MARKER SKIN DUAL TIP RULER LAB (MISCELLANEOUS) ×2 IMPLANT
NDL CAPSULORHEX 25GA (NEEDLE) ×1 IMPLANT
NDL FILTER BLUNT 18X1 1/2 (NEEDLE) ×2 IMPLANT
NEEDLE CAPSULORHEX 25GA (NEEDLE) ×2 IMPLANT
NEEDLE FILTER BLUNT 18X 1/2SAF (NEEDLE) ×2
NEEDLE FILTER BLUNT 18X1 1/2 (NEEDLE) ×2 IMPLANT
PACK CATARACT BRASINGTON (MISCELLANEOUS) ×2 IMPLANT
PACK EYE AFTER SURG (MISCELLANEOUS) ×2 IMPLANT
PACK OPTHALMIC (MISCELLANEOUS) ×2 IMPLANT
SOLUTION OPHTHALMIC SALT (MISCELLANEOUS) ×2 IMPLANT
SYR 3ML LL SCALE MARK (SYRINGE) ×4 IMPLANT
SYR TB 1ML LUER SLIP (SYRINGE) ×2 IMPLANT
WATER STERILE IRR 250ML POUR (IV SOLUTION) ×2 IMPLANT
WIPE NON LINTING 3.25X3.25 (MISCELLANEOUS) ×2 IMPLANT

## 2020-12-02 NOTE — Anesthesia Preprocedure Evaluation (Signed)
Anesthesia Evaluation  Patient identified by MRN, date of birth, ID band Patient awake    Reviewed: Allergy & Precautions, NPO status   Airway Mallampati: II       Dental   Pulmonary neg pulmonary ROS,    Pulmonary exam normal        Cardiovascular hypertension, Normal cardiovascular exam     Neuro/Psych  Neuromuscular disease (chemo induced peripheral neuropathy) negative psych ROS   GI/Hepatic GERD  Controlled,  Endo/Other  diabetes, Type 2Hypothyroidism   Renal/GU CRFRenal disease     Musculoskeletal  (+) Arthritis , Osteoarthritis,    Abdominal   Peds  Hematology H/o DVT/PE   Anesthesia Other Findings   Reproductive/Obstetrics                             Anesthesia Physical  Anesthesia Plan  ASA: III  Anesthesia Plan: MAC   Post-op Pain Management:    Induction: Intravenous  PONV Risk Score and Plan: 1 and TIVA, Midazolam and Treatment may vary due to age or medical condition  Airway Management Planned: Natural Airway  Additional Equipment:   Intra-op Plan:   Post-operative Plan:   Informed Consent: I have reviewed the patients History and Physical, chart, labs and discussed the procedure including the risks, benefits and alternatives for the proposed anesthesia with the patient or authorized representative who has indicated his/her understanding and acceptance.     Dental advisory given  Plan Discussed with: CRNA  Anesthesia Plan Comments:         Anesthesia Quick Evaluation

## 2020-12-02 NOTE — Op Note (Signed)
LOCATION:  [place]   PREOPERATIVE DIAGNOSIS:    Nuclear sclerotic cataract right eye. H25.11   POSTOPERATIVE DIAGNOSIS:  Nuclear sclerotic cataract right eye.     PROCEDURE:  Phacoemusification with posterior chamber intraocular lens placement of the right eye   ULTRASOUND TIME: Procedure(s): CATARACT EXTRACTION PHACO AND INTRAOCULAR LENS PLACEMENT (IOC) RIGHT MALYUGIN 5.52 00:49.7 11.1% (Right)  LENS:   Implant Name Type Inv. Item Serial No. Manufacturer Lot No. LRB No. Used Action  LENS IOL TECNIS EYHANCE 16.5 - [id number] Intraocular Lens LENS IOL TECNIS EYHANCE 16.5 [id number] JOHNSON   Right 1 Implanted         SURGEON:  Wyonia Hough, MD   ANESTHESIA:  Topical with tetracaine drops and 2% Xylocaine jelly, augmented with 1% preservative-free intracameral lidocaine.    COMPLICATIONS:  None.   DESCRIPTION OF PROCEDURE:  The patient was identified in the holding room and transported to the operating room and placed in the supine position under the operating microscope.  The right eye was identified as the operative eye and it was prepped and draped in the usual sterile ophthalmic fashion.   A 1 millimeter clear-corneal paracentesis was made at the 12:00 position.  0.5 ml of preservative-free 1% lidocaine was injected into the anterior chamber. The anterior chamber was filled with Viscoat viscoelastic.  A 2.4 millimeter keratome was used to make a near-clear corneal incision at the 9:00 position.  A curvilinear capsulorrhexis was made with a cystotome and capsulorrhexis forceps.  Balanced salt solution was used to hydrodissect and hydrodelineate the nucleus.   Phacoemulsification was then used in stop and chop fashion to remove the lens nucleus and epinucleus.  The remaining cortex was then removed using the irrigation and aspiration handpiece. Provisc was then placed into the capsular bag to distend it for lens placement.  A lens was then injected into the  capsular bag.  The remaining viscoelastic was aspirated.   Wounds were hydrated with balanced salt solution.  The anterior chamber was inflated to a physiologic pressure with balanced salt solution.  No wound leaks were noted. Cefuroxime 0.1 ml of a 10mg /ml solution was injected into the anterior chamber for a dose of 1 mg of intracameral antibiotic at the completion of the case.   Timolol and Brimonidine drops were applied to the eye.  The patient was taken to the recovery room in stable condition without complications of anesthesia or surgery.   BRASINGTON,CHADW[place] 12/02/2020, 10:04 AM

## 2020-12-02 NOTE — H&P (Signed)

## 2020-12-02 NOTE — Anesthesia Procedure Notes (Signed)
Procedure Name: MAC Date/Time: 12/02/2020 9:43 AM Performed by: Dionne Bucy, CRNA Pre-anesthesia Checklist: Patient identified, Emergency Drugs available, Suction available, Patient being monitored and Timeout performed Patient Re-evaluated:Patient Re-evaluated prior to induction Oxygen Delivery Method: Nasal cannula Placement Confirmation: positive ETCO2

## 2020-12-02 NOTE — Anesthesia Postprocedure Evaluation (Signed)
Anesthesia Post Note  Patient: Phillip [name]  Procedure(s) Performed: CATARACT EXTRACTION PHACO AND INTRAOCULAR LENS PLACEMENT (IOC) RIGHT MALYUGIN 5.52 00:49.7 11.1% (Right Eye)     Patient location during evaluation: PACU Anesthesia Type: MAC Level of consciousness: awake Pain management: pain level controlled Vital Signs Assessment: post-procedure vital signs reviewed and stable Respiratory status: respiratory function stable Cardiovascular status: stable Postop Assessment: no apparent nausea or vomiting Anesthetic complications: no   No complications documented.  Veda Canning

## 2020-12-02 NOTE — Transfer of Care (Signed)
Immediate Anesthesia Transfer of Care Note  Patient: Phillip [name]  Procedure(s) Performed: CATARACT EXTRACTION PHACO AND INTRAOCULAR LENS PLACEMENT (IOC) RIGHT MALYUGIN 5.52 00:49.7 11.1% (Right Eye)  Patient Location: PACU  Anesthesia Type: MAC  Level of Consciousness: awake, alert  and patient cooperative  Airway and Oxygen Therapy: Patient Spontanous Breathing and Patient connected to supplemental oxygen  Post-op Assessment: Post-op Vital signs reviewed, Patient's Cardiovascular Status Stable, Respiratory Function Stable, Patent Airway and No signs of Nausea or vomiting  Post-op Vital Signs: Reviewed and stable  Complications: No complications documented.

## 2020-12-03 ENCOUNTER — Encounter: Payer: Self-pay | Admitting: Ophthalmology

## 2020-12-23 ENCOUNTER — Other Ambulatory Visit: Payer: Self-pay

## 2020-12-23 ENCOUNTER — Emergency Department: Payer: Medicare Other

## 2020-12-23 ENCOUNTER — Inpatient Hospital Stay
Admission: EM | Admit: 2020-12-23 | Discharge: 2020-12-25 | DRG: 282 | Disposition: A | Discharge status: Other Acute Inpatient Hospital | Payer: Medicare Other | Source: Ambulatory Visit | Attending: Internal Medicine | Admitting: Internal Medicine

## 2020-12-23 DIAGNOSIS — Z7901 Long term (current) use of anticoagulants: Secondary | ICD-10-CM

## 2020-12-23 DIAGNOSIS — Z960 Presence of urogenital implants: Secondary | ICD-10-CM | POA: Diagnosis present

## 2020-12-23 DIAGNOSIS — Z79890 Hormone replacement therapy (postmenopausal): Secondary | ICD-10-CM

## 2020-12-23 DIAGNOSIS — I2510 Atherosclerotic heart disease of native coronary artery without angina pectoris: Secondary | ICD-10-CM | POA: Diagnosis present

## 2020-12-23 DIAGNOSIS — I214 Non-ST elevation (NSTEMI) myocardial infarction: Principal | ICD-10-CM | POA: Diagnosis present

## 2020-12-23 DIAGNOSIS — I82409 Acute embolism and thrombosis of unspecified deep veins of unspecified lower extremity: Secondary | ICD-10-CM | POA: Diagnosis present

## 2020-12-23 DIAGNOSIS — T451X5A Adverse effect of antineoplastic and immunosuppressive drugs, initial encounter: Secondary | ICD-10-CM | POA: Diagnosis present

## 2020-12-23 DIAGNOSIS — M2550 Pain in unspecified joint: Secondary | ICD-10-CM | POA: Diagnosis present

## 2020-12-23 DIAGNOSIS — Z8546 Personal history of malignant neoplasm of prostate: Secondary | ICD-10-CM

## 2020-12-23 DIAGNOSIS — E785 Hyperlipidemia, unspecified: Secondary | ICD-10-CM | POA: Diagnosis present

## 2020-12-23 DIAGNOSIS — Z79899 Other long term (current) drug therapy: Secondary | ICD-10-CM

## 2020-12-23 DIAGNOSIS — N1831 Chronic kidney disease, stage 3a: Secondary | ICD-10-CM | POA: Diagnosis present

## 2020-12-23 DIAGNOSIS — G620 Drug-induced polyneuropathy: Secondary | ICD-10-CM | POA: Diagnosis present

## 2020-12-23 DIAGNOSIS — K219 Gastro-esophageal reflux disease without esophagitis: Secondary | ICD-10-CM | POA: Diagnosis present

## 2020-12-23 DIAGNOSIS — I2699 Other pulmonary embolism without acute cor pulmonale: Secondary | ICD-10-CM | POA: Diagnosis present

## 2020-12-23 DIAGNOSIS — C679 Malignant neoplasm of bladder, unspecified: Secondary | ICD-10-CM | POA: Diagnosis present

## 2020-12-23 DIAGNOSIS — E1140 Type 2 diabetes mellitus with diabetic neuropathy, unspecified: Secondary | ICD-10-CM | POA: Diagnosis present

## 2020-12-23 DIAGNOSIS — Z8249 Family history of ischemic heart disease and other diseases of the circulatory system: Secondary | ICD-10-CM

## 2020-12-23 DIAGNOSIS — C61 Malignant neoplasm of prostate: Secondary | ICD-10-CM | POA: Diagnosis present

## 2020-12-23 DIAGNOSIS — Z86711 Personal history of pulmonary embolism: Secondary | ICD-10-CM

## 2020-12-23 DIAGNOSIS — Z86718 Personal history of other venous thrombosis and embolism: Secondary | ICD-10-CM

## 2020-12-23 DIAGNOSIS — Z888 Allergy status to other drugs, medicaments and biological substances status: Secondary | ICD-10-CM

## 2020-12-23 DIAGNOSIS — Z85828 Personal history of other malignant neoplasm of skin: Secondary | ICD-10-CM

## 2020-12-23 DIAGNOSIS — I82461 Acute embolism and thrombosis of right calf muscular vein: Secondary | ICD-10-CM | POA: Diagnosis present

## 2020-12-23 DIAGNOSIS — E039 Hypothyroidism, unspecified: Secondary | ICD-10-CM | POA: Diagnosis present

## 2020-12-23 DIAGNOSIS — I10 Essential (primary) hypertension: Secondary | ICD-10-CM | POA: Diagnosis present

## 2020-12-23 DIAGNOSIS — Z66 Do not resuscitate: Secondary | ICD-10-CM | POA: Diagnosis present

## 2020-12-23 DIAGNOSIS — E782 Mixed hyperlipidemia: Secondary | ICD-10-CM | POA: Diagnosis present

## 2020-12-23 DIAGNOSIS — M1990 Unspecified osteoarthritis, unspecified site: Secondary | ICD-10-CM | POA: Diagnosis present

## 2020-12-23 DIAGNOSIS — Z20822 Contact with and (suspected) exposure to covid-19: Secondary | ICD-10-CM | POA: Diagnosis present

## 2020-12-23 DIAGNOSIS — Z8551 Personal history of malignant neoplasm of bladder: Secondary | ICD-10-CM

## 2020-12-23 DIAGNOSIS — E1122 Type 2 diabetes mellitus with diabetic chronic kidney disease: Secondary | ICD-10-CM | POA: Diagnosis present

## 2020-12-23 DIAGNOSIS — E1169 Type 2 diabetes mellitus with other specified complication: Secondary | ICD-10-CM | POA: Diagnosis present

## 2020-12-23 DIAGNOSIS — I1310 Hypertensive heart and chronic kidney disease without heart failure, with stage 1 through stage 4 chronic kidney disease, or unspecified chronic kidney disease: Secondary | ICD-10-CM | POA: Diagnosis present

## 2020-12-23 DIAGNOSIS — E291 Testicular hypofunction: Secondary | ICD-10-CM | POA: Diagnosis present

## 2020-12-23 HISTORY — DX: Chronic kidney disease, stage 3 unspecified: N18.30

## 2020-12-23 HISTORY — DX: Atherosclerotic heart disease of native coronary artery without angina pectoris: I25.10

## 2020-12-23 HISTORY — DX: Ischemic cardiomyopathy: I25.5

## 2020-12-23 LAB — CBC
HCT: 48.3 % (ref 39.0–52.0)
Hemoglobin: 15.1 g/dL (ref 13.0–17.0)
MCH: 27 pg (ref 26.0–34.0)
MCHC: 31.3 g/dL (ref 30.0–36.0)
MCV: 86.4 fL (ref 80.0–100.0)
Platelets: 418 10*3/uL — ABNORMAL HIGH (ref 150–400)
RBC: 5.59 MIL/uL (ref 4.22–5.81)
RDW: 15.6 % — ABNORMAL HIGH (ref 11.5–15.5)
WBC: 10.6 10*3/uL — ABNORMAL HIGH (ref 4.0–10.5)
nRBC: 0 % (ref 0.0–0.2)

## 2020-12-23 LAB — BASIC METABOLIC PANEL
Anion gap: 5 (ref 5–15)
BUN: 20 mg/dL (ref 8–23)
CO2: 25 mmol/L (ref 22–32)
Calcium: 8.8 mg/dL — ABNORMAL LOW (ref 8.9–10.3)
Chloride: 110 mmol/L (ref 98–111)
Creatinine, Ser: 1.38 mg/dL — ABNORMAL HIGH (ref 0.61–1.24)
GFR, Estimated: 54 mL/min — ABNORMAL LOW (ref >60)
Glucose, Bld: 120 mg/dL — ABNORMAL HIGH (ref 70–99)
Potassium: 4.7 mmol/L (ref 3.5–5.1)
Sodium: 140 mmol/L (ref 135–145)

## 2020-12-23 LAB — TROPONIN I (HIGH SENSITIVITY)
Troponin I (High Sensitivity): 192 ng/L (ref <18)
Troponin I (High Sensitivity): 637 ng/L (ref <18)

## 2020-12-23 LAB — PROTIME-INR
INR: 1.7 — ABNORMAL HIGH (ref 0.8–1.2)
Prothrombin Time: 19.3 seconds — ABNORMAL HIGH (ref 11.4–15.2)

## 2020-12-23 LAB — RESP PANEL BY RT-PCR (FLU A&B, COVID) ARPGX2
Influenza A by PCR: NEGATIVE
Influenza B by PCR: NEGATIVE
SARS Coronavirus 2 by RT PCR: NEGATIVE

## 2020-12-23 LAB — APTT: aPTT: 117 seconds — ABNORMAL HIGH (ref 24–36)

## 2020-12-23 LAB — HEPARIN LEVEL (UNFRACTIONATED): Heparin Unfractionated: 0.1 IU/mL — ABNORMAL LOW (ref 0.30–0.70)

## 2020-12-23 MED ORDER — ONDANSETRON HCL 4 MG/2ML IJ SOLN: 4.0000 mg | Freq: Four times a day (QID) | INTRAMUSCULAR | Status: DC | PRN

## 2020-12-23 MED ORDER — ASPIRIN 81 MG PO CHEW
81.0000 mg | CHEWABLE_TABLET | ORAL | Status: AC
  Administered 2020-12-24: 81 mg via ORAL
  Filled 2020-12-23: qty 1

## 2020-12-23 MED ORDER — WARFARIN SODIUM 4 MG PO TABS: 4.0000 mg | ORAL_TABLET | Freq: Every day | ORAL | Status: DC

## 2020-12-23 MED ORDER — LEVOTHYROXINE SODIUM 50 MCG PO TABS
50.0000 ug | ORAL_TABLET | Freq: Every day | ORAL | Status: DC
  Administered 2020-12-24 – 2020-12-25 (×2): 50 ug via ORAL
  Filled 2020-12-23 (×2): qty 1

## 2020-12-23 MED ORDER — ACETAMINOPHEN 325 MG PO TABS: 650.0000 mg | ORAL_TABLET | Freq: Four times a day (QID) | ORAL | Status: DC | PRN

## 2020-12-23 MED ORDER — BENAZEPRIL HCL 20 MG PO TABS
20.0000 mg | ORAL_TABLET | Freq: Every day | ORAL | Status: DC
  Filled 2020-12-23 (×2): qty 1

## 2020-12-23 MED ORDER — HEPARIN (PORCINE) 25000 UT/250ML-% IV SOLN
1200.0000 [IU]/h | INTRAVENOUS | Status: DC
  Administered 2020-12-23: 900 [IU]/h via INTRAVENOUS
  Administered 2020-12-24 (×2): 1200 [IU]/h via INTRAVENOUS
  Filled 2020-12-23 (×3): qty 250

## 2020-12-23 MED ORDER — VENLAFAXINE HCL ER 37.5 MG PO CP24: 37.5000 mg | ORAL_CAPSULE | Freq: Every day | ORAL | Status: DC

## 2020-12-23 MED ORDER — SODIUM CHLORIDE 0.9 % WEIGHT BASED INFUSION
3.0000 mL/kg/h | INTRAVENOUS | Status: AC
  Administered 2020-12-24: 3 mL/kg/h via INTRAVENOUS

## 2020-12-23 MED ORDER — SODIUM CHLORIDE 0.9% FLUSH: 3.0000 mL | INTRAVENOUS | Status: DC | PRN

## 2020-12-23 MED ORDER — HEPARIN BOLUS VIA INFUSION
2000.0000 [IU] | Freq: Once | INTRAVENOUS | Status: AC
  Administered 2020-12-23: 2000 [IU] via INTRAVENOUS
  Filled 2020-12-23: qty 2000

## 2020-12-23 MED ORDER — ADULT MULTIVITAMIN W/MINERALS CH
1.0000 | ORAL_TABLET | Freq: Every day | ORAL | Status: DC
  Administered 2020-12-25: 1 via ORAL
  Filled 2020-12-23: qty 1

## 2020-12-23 MED ORDER — AMLODIPINE BESYLATE 5 MG PO TABS
5.0000 mg | ORAL_TABLET | Freq: Every day | ORAL | Status: DC
  Administered 2020-12-25: 5 mg via ORAL
  Filled 2020-12-23: qty 1

## 2020-12-23 MED ORDER — SODIUM CHLORIDE 0.9 % IV SOLN: 250.0000 mL | INTRAVENOUS | Status: DC | PRN

## 2020-12-23 MED ORDER — SODIUM CHLORIDE 0.9% FLUSH
3.0000 mL | Freq: Two times a day (BID) | INTRAVENOUS | Status: DC
  Administered 2020-12-23 – 2020-12-24 (×2): 3 mL via INTRAVENOUS

## 2020-12-23 MED ORDER — HEPARIN BOLUS VIA INFUSION
3800.0000 [IU] | Freq: Once | INTRAVENOUS | Status: AC
  Administered 2020-12-23: 3800 [IU] via INTRAVENOUS
  Filled 2020-12-23: qty 3800

## 2020-12-23 MED ORDER — IOHEXOL 350 MG/ML SOLN
75.0000 mL | Freq: Once | INTRAVENOUS | Status: AC | PRN
  Administered 2020-12-23: 75 mL via INTRAVENOUS

## 2020-12-23 MED ORDER — ONDANSETRON HCL 4 MG PO TABS: 4.0000 mg | ORAL_TABLET | Freq: Four times a day (QID) | ORAL | Status: DC | PRN

## 2020-12-23 MED ORDER — ACETAMINOPHEN 650 MG RE SUPP: 650.0000 mg | Freq: Four times a day (QID) | RECTAL | Status: DC | PRN

## 2020-12-23 MED ORDER — PANTOPRAZOLE SODIUM 40 MG PO TBEC
40.0000 mg | DELAYED_RELEASE_TABLET | Freq: Every day | ORAL | Status: DC
  Administered 2020-12-25: 40 mg via ORAL
  Filled 2020-12-23: qty 1

## 2020-12-23 MED ORDER — SODIUM CHLORIDE 0.9 % WEIGHT BASED INFUSION: 1.0000 mL/kg/h | INTRAVENOUS | Status: DC

## 2020-12-23 NOTE — Consult Note (Signed)
ANTICOAGULATION CONSULT NOTE  Pharmacy Consult for Heparin Indication: chest pain/ACS  Patient Measurements: Heparin Dosing Weight: 76 kg  Labs: Recent Labs    12/23/20 1200 12/23/20 1322 12/23/20 1340 12/23/20 1716 12/23/20 2140  HGB 15.1  --   --   --   --   HCT 48.3  --   --   --   --   PLT 418*  --   --   --   --   APTT  --   --   --  117*  --   LABPROT  --  19.3*  --   --   --   INR  --  1.7*  --   --   --   HEPARINUNFRC  --   --   --   --  0.10*  CREATININE 1.38*  --   --   --   --   TROPONINIHS 192*  --  637*  --   --     Estimated Creatinine Clearance: 47 mL/min (A) (by C-G formula based on SCr of 1.38 mg/dL (H)).   Medical History: Past Medical History:  Diagnosis Date  . Arthritis   . Basal cell carcinoma    L clavicle, L prox forearm- removed years ago   . Bladder cancer ([place])   . Bladder neck contracture   . Cataract   . DVT (deep venous thrombosis) ([place]) 11/25/2018   right le  . Dyslipidemia   . ED (erectile dysfunction)   . Frequency   . GERD (gastroesophageal reflux disease)   . History of pulmonary embolus (PE)   . Hypertension   . Hypothyroidism   . Incontinence of urine    sui, s/p cryoablation  . Kidney stones   . Neuropathy    feet  . Nocturia   . Prostate cancer ([place])    S/P   CRYOABLATION  . Right elbow tendinitis   . Vertigo    1-2x/yr  . Wears glasses     Assessment: Pharmacy consulted to start heparin for ACS. Trops elevated 192. On chronic warfarin for DVT/PE.   4/6 at 2140 HL = 0.10; subtherapeutic, 900 units/hr  Goal of Therapy:  Heparin level 0.3-0.7 units/ml Monitor platelets by anticoagulation protocol: Yes   Plan:  --Heparin level is subtherapeutic. Will order IV heparin 2000 unit bolus x 1 and increase heparin infusion to 1200 units/hr --Re-check HL 8 hours after rate change --Daily CBC per protocol while on IV heparin  Benita Gutter 12/23/2020,10:11 PM

## 2020-12-23 NOTE — ED Notes (Signed)
MD Joni Fears informed of pt's trop of 192

## 2020-12-23 NOTE — ED Provider Notes (Signed)
[facility] Emergency Department Provider Note   ____________________________________________   Event Date/Time   First MD Initiated Contact with Patient 12/23/20 1304     (approximate)  I have reviewed the triage vital signs and the nursing notes.   HISTORY  Chief Complaint Chest Pain    HPI Phillip [name] is a 75 y.o. male with past medical history of hypertension, hyperlipidemia, diabetes, and DVT/PE on Coumadin who presents to the ED complaining of chest pain.  Patient reports that for the past couple of days he has been having intermittent pain in the center of his chest.  He describes it as a dull ache that became more severe today while he was shoveling dirt.  Pain has since resolved and he denies any complaints at this time.  He has not had any fevers, cough, shortness of breath, nausea, or vomiting.  He states current symptoms do not feel similar to when he was diagnosed with PE and he has been compliant with his Coumadin dosing.        Past Medical History:  Diagnosis Date  . Arthritis   . Basal cell carcinoma    L clavicle, L prox forearm- removed years ago   . Bladder cancer ([place])   . Bladder neck contracture   . Cataract   . DVT (deep venous thrombosis) ([place]) 11/25/2018   right le  . Dyslipidemia   . ED (erectile dysfunction)   . Frequency   . GERD (gastroesophageal reflux disease)   . History of pulmonary embolus (PE)   . Hypertension   . Hypothyroidism   . Incontinence of urine    sui, s/p cryoablation  . Kidney stones   . Neuropathy    feet  . Nocturia   . Prostate cancer ([place])    S/P   CRYOABLATION  . Right elbow tendinitis   . Vertigo    1-2x/yr  . Wears glasses     Patient Active Problem List   Diagnosis Date Noted  . Acquired hypothyroidism 04/20/2020  . 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 Hypercoagulable state ([place]) 12/10/2018  . Acute deep  vein thrombosis (DVT) of calf muscle vein of right lower extremity ([place])   . Pulmonary emboli ([place]) 12/07/2018  . GERD (gastroesophageal reflux disease) 12/07/2018  . HTN (hypertension) 12/07/2018  . HLD (hyperlipidemia) 12/07/2018  . Prostate cancer ([place]) 12/07/2018  . Bladder cancer ([place]) 12/07/2018  . DM type 2 with diabetic mixed hyperlipidemia ([place]) 04/04/2018  . Pain in right hand 10/04/2017  . Tubular adenoma 09/29/2017  . Helicobacter pylori (H. pylori) infection 09/04/2017  . Medicare annual wellness visit, initial 03/31/2017  . History of prostate cancer 10/27/2014  . Lower urinary tract symptoms (LUTS) 03/18/2012  . Nephrolithiasis 03/18/2012    Past Surgical History:  Procedure Laterality Date  . ARTHROPLASTY AND TENDON REPAIR LEFT THUMB  JAN 2014  . CATARACT EXTRACTION W/PHACO Left 11/16/2020   Procedure: CATARACT EXTRACTION PHACO AND INTRAOCULAR LENS PLACEMENT (IOC) LEFT  7.09 00:56.4 12.6%;  Surgeon: Leandrew Koyanagi, MD;  Location: [place];  Service: Ophthalmology;  Laterality: Left;  . CATARACT EXTRACTION W/PHACO Right 12/02/2020   Procedure: CATARACT EXTRACTION PHACO AND INTRAOCULAR LENS PLACEMENT (IOC) RIGHT MALYUGIN 5.52 00:49.7 11.1%;  Surgeon: Leandrew Koyanagi, MD;  Location: [place];  Service: Ophthalmology;  Laterality: Right;  . COLONOSCOPY    . COLONOSCOPY WITH PROPOFOL N/A 08/09/2017   Procedure: COLONOSCOPY WITH PROPOFOL;  Surgeon: Manya Silvas,  MD;  Location: [facility] ENDOSCOPY;  Service: Endoscopy;  Laterality: N/A;  . ESOPHAGOGASTRODUODENOSCOPY (EGD) WITH PROPOFOL N/A 08/09/2017   Procedure: ESOPHAGOGASTRODUODENOSCOPY (EGD) WITH PROPOFOL;  Surgeon: Manya Silvas, MD;  Location: [facility] ENDOSCOPY;  Service: Endoscopy;  Laterality: N/A;  . GREEN LIGHT LASER TURP (TRANSURETHRAL RESECTION OF PROSTATE N/A 12/14/2015   Procedure: GREEN LIGHT LASER TURP (TRANSURETHRAL RESECTION OF PROSTATE) LASER OF BLADDER NECK CONTRACTURE;   Surgeon: Carolan Clines, MD;  Location: [place];  Service: Urology;  Laterality: N/A;  . PENILE PROSTHESIS IMPLANT N/A 12/06/2013   Procedure: PENILE PROTHESIS INFLATABLE;  Surgeon: Ailene Rud, MD;  Location: [facility];  Service: Urology;  Laterality: N/A;  . PROSTATE CRYOABLATION  06-01-2012  [facility]  . TRANSURETHRAL RESECTION OF BLADDER NECK N/A 03/20/2015   Procedure: RELEASE BLADDER NECK CONTRACTURE  WITH WOLF BUTTON ELECTRODE ;  Surgeon: Carolan Clines, MD;  Location: [place];  Service: Urology;  Laterality: N/A;  . TRANSURETHRAL RESECTION OF BLADDER TUMOR N/A 12/05/2018   Procedure: TRANSURETHRAL RESECTION OF BLADDER TUMOR (TURBT)/CYSTOSCOPY/  INSTILLATION OF Rodell Perna;  Surgeon: Ceasar Mons, MD;  Location: [facility];  Service: Urology;  Laterality: N/A;  . TRANSURETHRAL RESECTION OF BLADDER TUMOR N/A 01/15/2020   Procedure: TRANSURETHRAL RESECTION OF BLADDER TUMOR (TURBT)/ CYSTOSCOPY;  Surgeon: Ceasar Mons, MD;  Location: [facility];  Service: Urology;  Laterality: N/A;  . TRIGGER FINGER RELEASE Left 10/2015  . ulner nerve neuropathy      Prior to Admission medications   Medication Sig Start Date End Date Taking? Authorizing Provider  acetaminophen (TYLENOL) 500 MG tablet Take 500 mg by mouth every 6 (six) hours as needed.    [provider]  amLODipine (NORVASC) 5 MG tablet Take 5 mg by mouth daily.    [provider]  benazepril (LOTENSIN) 20 MG tablet Take 20 mg by mouth daily.    [provider]  cetirizine (ZYRTEC) 10 MG tablet Take 10 mg by mouth 2 (two) times daily.     [provider]  cholecalciferol (VITAMIN D3) 25 MCG (1000 UT) tablet Take 1,000 Units by mouth daily.    [provider]  Chromium 1 MG CAPS Take 1 mg by mouth daily. 1 per day    [provider]  diclofenac (VOLTAREN) 75 MG EC  tablet Take 75 mg by mouth 2 (two) times daily. 06/24/20   [provider]  Glucosamine-Chondroit-Vit C-Mn (GLUCOSAMINE CHONDR 1500 COMPLX PO) Take 1 tablet by mouth daily.    [provider]  levothyroxine (SYNTHROID) 75 MCG tablet Take 50 mcg by mouth. 04/20/20 04/20/21  [provider]  Multiple Vitamin (MULTIVITAMIN) tablet Take 1 tablet by mouth daily.    [provider]  niacin 500 MG tablet Take 500 mg by mouth at bedtime.    [provider]  omeprazole (PRILOSEC) 40 MG capsule Take 40 mg by mouth daily.  08/27/16   [provider]  phenazopyridine (PYRIDIUM) 200 MG tablet Take 1 tablet (200 mg total) by mouth 3 (three) times daily as needed (for pain with urination). Patient not taking: Reported on 11/11/2020 01/15/20 01/14/21  Ceasar Mons, MD  potassium phosphate, monobasic, (K-PHOS ORIGINAL) 500 MG tablet Take 500 mg by mouth daily.    [provider]  sertraline (ZOLOFT) 50 MG tablet Take 50 mg by mouth daily. Patient not taking: Reported on 11/11/2020 06/29/20   [provider]  testosterone cypionate (DEPOTESTOSTERONE CYPIONATE) 200 MG/ML  injection Inject 200 mg into the muscle every 14 (fourteen) days.  08/03/16   [provider]  TURMERIC PO Take 1 Dose by mouth daily.     [provider]  venlafaxine XR (EFFEXOR-XR) 37.5 MG 24 hr capsule Take 37.5 mg by mouth daily. 06/29/20   [provider]  warfarin (COUMADIN) 4 MG tablet Take 4 mg by mouth daily. 03/25/20   [provider]    Allergies Doxazosin  No family history on file.  Social History Social History   Tobacco Use  . Smoking status: Never Smoker  . Smokeless tobacco: Never Used  Vaping Use  . Vaping Use: Never used  Substance Use Topics  . Alcohol use: No  . Drug use: No    Review of Systems  Constitutional: No fever/chills Eyes: No visual changes. ENT: No sore throat. Cardiovascular: Positive  for chest pain. Respiratory: Denies shortness of breath. Gastrointestinal: No abdominal pain.  No nausea, no vomiting.  No diarrhea.  No constipation. Genitourinary: Negative for dysuria. Musculoskeletal: Negative for back pain. Skin: Negative for rash. Neurological: Negative for headaches, focal weakness or numbness.  ____________________________________________   PHYSICAL EXAM:  VITAL SIGNS: ED Triage Vitals  Enc Vitals Group     BP 12/23/20 1155 133/82     Pulse Rate 12/23/20 1155 73     Resp 12/23/20 1155 18     Temp 12/23/20 1155 98 F (36.7 C)     Temp src --      SpO2 12/23/20 1155 98 %     Weight --      Height --      Head Circumference --      Peak Flow --      Pain Score 12/23/20 1157 4     Pain Loc --      Pain Edu? --      Excl. in [place]? --     Constitutional: Alert and oriented. Eyes: Conjunctivae are normal. Head: Atraumatic. Nose: No congestion/rhinnorhea. Mouth/Throat: Mucous membranes are moist. Neck: Normal ROM Cardiovascular: Normal rate, regular rhythm. Grossly normal heart sounds.  2+ radial pulses bilaterally. Respiratory: Normal respiratory effort.  No retractions. Lungs CTAB.  Chest wall nontender to palpation. Gastrointestinal: Soft and nontender. No distention. Genitourinary: deferred Musculoskeletal: No lower extremity tenderness nor edema. Neurologic:  Normal speech and language. No gross focal neurologic deficits are appreciated. Skin:  Skin is warm, dry and intact. No rash noted. Psychiatric: Mood and affect are normal. Speech and behavior are normal.  ____________________________________________   LABS (all labs ordered are listed, but only abnormal results are displayed)  Labs Reviewed  BASIC METABOLIC PANEL - Abnormal; Notable for the following components:      Result Value   Glucose, Bld 120 (*)    Creatinine, Ser 1.38 (*)    Calcium 8.8 (*)    GFR, Estimated 54 (*)    All other components within normal limits  CBC -  Abnormal; Notable for the following components:   WBC 10.6 (*)    RDW 15.6 (*)    Platelets 418 (*)    All other components within normal limits  PROTIME-INR - Abnormal; Notable for the following components:   Prothrombin Time 19.3 (*)    INR 1.7 (*)    All other components within normal limits  TROPONIN I (HIGH SENSITIVITY) - Abnormal; Notable for the following components:   Troponin I (High Sensitivity) 192 (*)    All other components within normal limits  TROPONIN I (HIGH  SENSITIVITY) - Abnormal; Notable for the following components:   Troponin I (High Sensitivity) 637 (*)    All other components within normal limits  RESP PANEL BY RT-PCR (FLU A&B, COVID) ARPGX2  APTT  HEPARIN LEVEL (UNFRACTIONATED)   ____________________________________________  EKG  ED ECG REPORT I, Blake Divine, the attending physician, personally viewed and interpreted this ECG.   Date: 12/23/2020  EKG Time: 11:52  Rate: 79  Rhythm: normal sinus rhythm  Axis: Normal  Intervals:first-degree A-V block   ST&T Change: None   PROCEDURES  Procedure(s) performed (including Critical Care):  .Critical Care Performed by: Blake Divine, MD Authorized by: Blake Divine, MD   Critical care provider statement:    Critical care time (minutes):  45   Critical care time was exclusive of:  Separately billable procedures and treating other patients and teaching time   Critical care was necessary to treat or prevent imminent or life-threatening deterioration of the following conditions:  Cardiac failure   Critical care was time spent personally by me on the following activities:  Discussions with consultants, evaluation of patient's response to treatment, examination of patient, ordering and performing treatments and interventions, ordering and review of laboratory studies, ordering and review of radiographic studies, pulse oximetry, re-evaluation of patient's condition, obtaining history from patient or  surrogate and review of old charts   I assumed direction of critical care for this patient from another provider in my specialty: no     Care discussed with: admitting provider       ____________________________________________   INITIAL IMPRESSION / ASSESSMENT AND PLAN / ED COURSE       75 year old male with past medical history of hypertension, hyperlipidemia, diabetes, and DVT/PE on Coumadin who presents to the ED complaining of intermittent chest pain over the past couple of days, more severe this morning.  His pain has now resolved and EKG is reassuring with no ischemic changes.  Troponin noted to be elevated to 192 from triage and we will start patient on heparin due to concern for ACS.  Symptoms sound atypical for PE but given his history we will further assess with CTA chest.  Chest x-ray reviewed by me and shows no infiltrate, edema, or effusion.  Remainder of labs are unremarkable.  Case discussed with Dr. Clayborn Bigness of cardiology, who agrees with plan for heparin.  Patient's troponin noted to be trending upward, although he continues to deny any chest pain at this time.  CTA chest reviewed by me and shows no obvious PE.  Case discussed with hospitalist for admission.      ____________________________________________   FINAL CLINICAL IMPRESSION(S) / ED DIAGNOSES  Final diagnoses:  NSTEMI (non-ST elevated myocardial infarction) [facility])     ED Discharge Orders    None       Note:  This document was prepared using Dragon voice recognition software and may include unintentional dictation errors.   Blake Divine, MD 12/23/20 1459

## 2020-12-23 NOTE — ED Triage Notes (Signed)
Pt comes from [facility] with c/o central CP for 2 days. Pt states it comes and goes. Pt states hx of PE.  Pt denies any SOB. Pt denies any radiation.

## 2020-12-23 NOTE — Consult Note (Signed)
[place] for Heparin Indication: chest pain/ACS  Allergies  Allergen Reactions  . Doxazosin Other (See Comments)    Other reaction(s): Unknown    Patient Measurements:   Heparin Dosing Weight: 76 kg  Vital Signs: Temp: 98 F (36.7 C) (04/06 1155) BP: 135/77 (04/06 1347) Pulse Rate: 75 (04/06 1347)  Labs: Recent Labs    12/23/20 1200  HGB 15.1  HCT 48.3  PLT 418*  CREATININE 1.38*  TROPONINIHS 192*    CrCl cannot be calculated (Unknown ideal weight.).   Medical History: Past Medical History:  Diagnosis Date  . Arthritis   . Basal cell carcinoma    L clavicle, L prox forearm- removed years ago   . Bladder cancer ([place])   . Bladder neck contracture   . Cataract   . DVT (deep venous thrombosis) ([place]) 11/25/2018   right le  . Dyslipidemia   . ED (erectile dysfunction)   . Frequency   . GERD (gastroesophageal reflux disease)   . History of pulmonary embolus (PE)   . Hypertension   . Hypothyroidism   . Incontinence of urine    sui, s/p cryoablation  . Kidney stones   . Neuropathy    feet  . Nocturia   . Prostate cancer ([place])    S/P   CRYOABLATION  . Right elbow tendinitis   . Vertigo    1-2x/yr  . Wears glasses     Medications:  (Not in a hospital admission)  Scheduled:  Infusions:  PRN:  Anti-infectives (From admission, onward)   None      Assessment: Pharmacy consulted to start heparin for ACS. Trops elevated 192. On chronic warfarin for DVT/PE.   Goal of Therapy:  Heparin level 0.3-0.7 units/ml Monitor platelets by anticoagulation protocol: Yes   Plan:  Give 3800 units bolus x 1 Start heparin infusion at 900 units/hr Check anti-Xa level in 8 hours and daily while on heparin Continue to monitor H&H and platelets  Oswald Hillock, PharmD, BCPS 12/23/2020,1:51 PM

## 2020-12-23 NOTE — ED Notes (Signed)
Informed RN bed assigned 1528

## 2020-12-23 NOTE — H&P (Signed)
History and Physical   Phillip [name] HMC:[id number] DOB: [birth year] DOA: 12/23/2020  PCP: Rusty Aus, MD  Outpatient Specialists: Dr. Tasia Catchings, medical oncology Patient coming from: Home  I have personally briefly reviewed patient's old medical records in [place].  Chief Concern: Chest pressure  HPI: Phillip [name] is a 75 y.o. male with medical history significant for history of bladder cancer status post cryoablation and TURP, hypertension, hyperlipidemia, non-insulin-dependent diabetes mellitus, history of postoperative pulmonary embolism, hypogonadism taking testosterone IM injection, presents emergency department for chief concerns of chest pressure.  He reports he is not experiencing chest pain.  He states that the chest pressure is substernal, lasting minutes.  He states that it started on 12/22/2020 and unrelated to activity.  He reports that he experienced the same chest pressure on 12/23/2020, while he was shoveling dirt.  He reports he has never experienced chest pressure like this before.  He denies bilateral upper extremity radiation, numbness, tingling.  He denies jaw and throat discomfort.  He denies changes to the taste in his mouth.  Family history: Patient reports that his father had his first MI when his father was 69 years old.  Social history: Patient lives at home with his wife for 71 years.  He denies tobacco, EtOH, recreational drug use.  He is currently retired.  Vaccination: He has received 3 doses of COVID-19 vaccination.  ROS: Constitutional: no weight change, no fever ENT/Mouth: no sore throat, no rhinorrhea Eyes: no eye pain, no vision changes Cardiovascular: no chest pain, no dyspnea,  no edema, no palpitations Respiratory: no cough, no sputum, no wheezing Gastrointestinal: no nausea, no vomiting, no diarrhea, no constipation Genitourinary: no urinary incontinence, no dysuria, no hematuria Musculoskeletal: no arthralgias, no myalgias Skin: no skin  lesions, no pruritus, Neuro: + weakness, no loss of consciousness, no syncope Psych: no anxiety, no depression, + decrease appetite Heme/Lymph: no bruising, no bleeding  ED Course: Discussed with ED provider, patient requiring hospitalization for NSTEMI.  Vitals in the emergency department revealed temperature of 98, respiration rate of 18, heart rate of 77, blood pressure 135/77, patient SPO2 satting at 98% on room air.  Labs in the emergency department was remarkable for serum potassium 4.7, bicarb 25, nonfasting blood sugar of 120, serum creatinine of 1.38, GFR 54, high-sensitivity troponin initially 192 increased to 637.  ED provider started patient on heparin per pharmacy for ACS.  Assessment/Plan  Principal Problem:   NSTEMI (non-ST elevated myocardial infarction) ([place]) Active Problems:   Pulmonary emboli (HCC)   GERD (gastroesophageal reflux disease)   HTN (hypertension)   HLD (hyperlipidemia)   Prostate cancer (HCC)   Bladder cancer (HCC)   Acute deep vein thrombosis (DVT) of calf muscle vein of right lower extremity (HCC)   Acquired hypothyroidism   Chemotherapy-induced neuropathy (HCC)   DM type 2 with diabetic mixed hyperlipidemia ([place])   History of prostate cancer   Chest pressure-likely NSTEMI CTA evidence of three-vessel disease -High-sensitivity troponin was 192 and increased to 637 -Heparin GTT -Cardiology, Dr. Clayborn Bigness has been consulted and states he will see the patient -Admit to progressive cardiac, observation, with telemetry  Hypogonadism-he receives testosterone IM every 14 days -His last administration was 12/18/2020  Hypertension-amlodipine 5 mg daily, benazepril 20 mg daily resumed  CKD 3 a-at baseline -Serum creatinine/eGFR on admission was 1.38/54  GERD-resumed PPI  History of pulmonary embolism-warfarin not resumed -Patient currently receiving Heparin GTT  Hypothyroid-resumed home levothyroxine 50 mcg daily in the a.m.  Joint pain-avoid  diclofenac due to NSTEMI  Chart reviewed.   DVT prophylaxis: Heparin GTT Code Status: DNR Diet: N.p.o. pending cardiology evaluation Family Communication: No Disposition Plan: Pending clinical course Consults called: Cardiology Admission status: Progressive cardiac, observation, with telemetry  Past Medical History:  Diagnosis Date  . Arthritis   . Basal cell carcinoma    L clavicle, L prox forearm- removed years ago   . Bladder cancer ([place])   . Bladder neck contracture   . Cataract   . DVT (deep venous thrombosis) ([place]) 11/25/2018   right le  . Dyslipidemia   . ED (erectile dysfunction)   . Frequency   . GERD (gastroesophageal reflux disease)   . History of pulmonary embolus (PE)   . Hypertension   . Hypothyroidism   . Incontinence of urine    sui, s/p cryoablation  . Kidney stones   . Neuropathy    feet  . Nocturia   . Prostate cancer ([place])    S/P   CRYOABLATION  . Right elbow tendinitis   . Vertigo    1-2x/yr  . Wears glasses    Past Surgical History:  Procedure Laterality Date  . ARTHROPLASTY AND TENDON REPAIR LEFT THUMB  JAN 2014  . CATARACT EXTRACTION W/PHACO Left 11/16/2020   Procedure: CATARACT EXTRACTION PHACO AND INTRAOCULAR LENS PLACEMENT (IOC) LEFT  7.09 00:56.4 12.6%;  Surgeon: Leandrew Koyanagi, MD;  Location: [place];  Service: Ophthalmology;  Laterality: Left;  . CATARACT EXTRACTION W/PHACO Right 12/02/2020   Procedure: CATARACT EXTRACTION PHACO AND INTRAOCULAR LENS PLACEMENT (IOC) RIGHT MALYUGIN 5.52 00:49.7 11.1%;  Surgeon: Leandrew Koyanagi, MD;  Location: [facility];  Service: Ophthalmology;  Laterality: Right;  . COLONOSCOPY    . COLONOSCOPY WITH PROPOFOL N/A 08/09/2017   Procedure: COLONOSCOPY WITH PROPOFOL;  Surgeon: Manya Silvas, MD;  Location: [facility] ENDOSCOPY;  Service: Endoscopy;  Laterality: N/A;  . ESOPHAGOGASTRODUODENOSCOPY (EGD) WITH PROPOFOL N/A 08/09/2017   Procedure: ESOPHAGOGASTRODUODENOSCOPY (EGD)  WITH PROPOFOL;  Surgeon: Manya Silvas, MD;  Location: [facility] ENDOSCOPY;  Service: Endoscopy;  Laterality: N/A;  . GREEN LIGHT LASER TURP (TRANSURETHRAL RESECTION OF PROSTATE N/A 12/14/2015   Procedure: GREEN LIGHT LASER TURP (TRANSURETHRAL RESECTION OF PROSTATE) LASER OF BLADDER NECK CONTRACTURE;  Surgeon: Carolan Clines, MD;  Location: [place];  Service: Urology;  Laterality: N/A;  . PENILE PROSTHESIS IMPLANT N/A 12/06/2013   Procedure: PENILE PROTHESIS INFLATABLE;  Surgeon: Ailene Rud, MD;  Location: [facility];  Service: Urology;  Laterality: N/A;  . PROSTATE CRYOABLATION  06-01-2012  [facility]  . TRANSURETHRAL RESECTION OF BLADDER NECK N/A 03/20/2015   Procedure: RELEASE BLADDER NECK CONTRACTURE  WITH WOLF BUTTON ELECTRODE ;  Surgeon: Carolan Clines, MD;  Location: [place];  Service: Urology;  Laterality: N/A;  . TRANSURETHRAL RESECTION OF BLADDER TUMOR N/A 12/05/2018   Procedure: TRANSURETHRAL RESECTION OF BLADDER TUMOR (TURBT)/CYSTOSCOPY/  INSTILLATION OF Rodell Perna;  Surgeon: Ceasar Mons, MD;  Location: [facility];  Service: Urology;  Laterality: N/A;  . TRANSURETHRAL RESECTION OF BLADDER TUMOR N/A 01/15/2020   Procedure: TRANSURETHRAL RESECTION OF BLADDER TUMOR (TURBT)/ CYSTOSCOPY;  Surgeon: Ceasar Mons, MD;  Location: [facility];  Service: Urology;  Laterality: N/A;  . TRIGGER FINGER RELEASE Left 10/2015  . ulner nerve neuropathy     Social History:  reports that he has never smoked. He has never used smokeless tobacco. He reports that he does not drink alcohol and does not use drugs.  Allergies  Allergen Reactions  . Doxazosin Other (See Comments)    Other reaction(s): Unknown   No family history on file. Family history: Family history reviewed and not pertinent  Prior to Admission medications   Medication Sig Start Date End Date Taking? Authorizing  Provider  acetaminophen (TYLENOL) 500 MG tablet Take 500 mg by mouth every 6 (six) hours as needed.    [provider]  amLODipine (NORVASC) 5 MG tablet Take 5 mg by mouth daily.    [provider]  benazepril (LOTENSIN) 20 MG tablet Take 20 mg by mouth daily.    [provider]  cetirizine (ZYRTEC) 10 MG tablet Take 10 mg by mouth 2 (two) times daily.     [provider]  cholecalciferol (VITAMIN D3) 25 MCG (1000 UT) tablet Take 1,000 Units by mouth daily.    [provider]  Chromium 1 MG CAPS Take 1 mg by mouth daily. 1 per day    [provider]  diclofenac (VOLTAREN) 75 MG EC tablet Take 75 mg by mouth 2 (two) times daily. 06/24/20   [provider]  Glucosamine-Chondroit-Vit C-Mn (GLUCOSAMINE CHONDR 1500 COMPLX PO) Take 1 tablet by mouth daily.    [provider]  levothyroxine (SYNTHROID) 75 MCG tablet Take 50 mcg by mouth. 04/20/20 04/20/21  [provider]  Multiple Vitamin (MULTIVITAMIN) tablet Take 1 tablet by mouth daily.    [provider]  niacin 500 MG tablet Take 500 mg by mouth at bedtime.    [provider]  omeprazole (PRILOSEC) 40 MG capsule Take 40 mg by mouth daily.  08/27/16   [provider]  phenazopyridine (PYRIDIUM) 200 MG tablet Take 1 tablet (200 mg total) by mouth 3 (three) times daily as needed (for pain with urination). Patient not taking: Reported on 11/11/2020 01/15/20 01/14/21  Ceasar Mons, MD  potassium phosphate, monobasic, (K-PHOS ORIGINAL) 500 MG tablet Take 500 mg by mouth daily.    [provider]  sertraline (ZOLOFT) 50 MG tablet Take 50 mg by mouth daily. Patient not taking: Reported on 11/11/2020 06/29/20   [provider]  testosterone cypionate (DEPOTESTOSTERONE CYPIONATE) 200 MG/ML injection Inject 200 mg into the muscle every 14 (fourteen) days.  08/03/16   [provider]  TURMERIC PO Take 1 Dose by mouth daily.      [provider]  venlafaxine XR (EFFEXOR-XR) 37.5 MG 24 hr capsule Take 37.5 mg by mouth daily. 06/29/20   [provider]  warfarin (COUMADIN) 4 MG tablet Take 4 mg by mouth daily. 03/25/20   [provider]   Physical Exam: Vitals:   12/23/20 1347 12/23/20 1357 12/23/20 1711 12/23/20 1944  BP: 135/77  (!) 148/89 (!) 147/93  Pulse: 75  74 70  Resp: 18  20 18   Temp:   98.6 F (37 C) 98.1 F (36.7 C)  TempSrc:    Oral  SpO2: 98%  100% 100%  Weight:  76.2 kg     Constitutional: appears age-appropriate, NAD, calm, comfortable Eyes: PERRL, lids and conjunctivae normal ENMT: Mucous membranes are moist. Posterior pharynx clear of any exudate or lesions. Age-appropriate dentition. Hearing appropriate Neck: normal, supple, no masses, no thyromegaly Respiratory: clear to auscultation bilaterally, no wheezing, no crackles. Normal respiratory effort. No accessory muscle use.  Cardiovascular: Regular rate and rhythm, no murmurs / rubs / gallops. No extremity edema. 2+ pedal pulses. No carotid bruits.  Abdomen: no tenderness, no masses palpated, no hepatosplenomegaly. Bowel sounds positive.  Musculoskeletal: no  clubbing / cyanosis. No joint deformity upper and lower extremities. Good ROM, no contractures, no atrophy. Normal muscle tone.  Skin: no rashes, lesions, ulcers. No induration. Neurologic: Sensation intact. Strength 5/5 in all 4.  Psychiatric: Normal judgment and insight. Alert and oriented x 3. Normal mood.   EKG: independently reviewed, showing sinus rhythm, first-degree AV block, rate of 79, QTc 392  Chest x-ray on Admission: I personally reviewed and I agree with radiologist reading as below.  DG Chest 2 View  Result Date: 12/23/2020 CLINICAL DATA:  Chest pain EXAM: CHEST - 2 VIEW COMPARISON:  None. FINDINGS: The heart size and mediastinal contours are within normal limits. Both lungs are clear. The visualized skeletal structures are unremarkable.  IMPRESSION: No active cardiopulmonary disease. Electronically Signed   By: Franchot Gallo M.D.   On: 12/23/2020 12:54   CT Angio Chest PE W/Cm &/Or Wo Cm  Result Date: 12/23/2020 CLINICAL DATA:  Suspected pulmonary embolism, high probability in a 75 year old male. EXAM: CT ANGIOGRAPHY CHEST WITH CONTRAST TECHNIQUE: Multidetector CT imaging of the chest was performed using the standard protocol during bolus administration of intravenous contrast. Multiplanar CT image reconstructions and MIPs were obtained to evaluate the vascular anatomy. CONTRAST:  89m OMNIPAQUE IOHEXOL 350 MG/ML SOLN COMPARISON:  December 07, 2018 FINDINGS: Cardiovascular: Mild cardiac enlargement. No pericardial effusion. Three-vessel coronary artery calcification. Mild-to-moderate generalized aortic atherosclerosis without aneurysm. Normal caliber central pulmonary vessels. Negative for acute pulmonary embolism. Mediastinum/Nodes: Thoracic inlet structures are normal. No axillary lymphadenopathy. No mediastinal lymphadenopathy. No hilar lymphadenopathy. Esophagus grossly normal. Lungs/Pleura: Basilar atelectasis. Mild air trapping. No consolidation. No pleural effusion. Upper Abdomen: Low-density splenic lesions are similar to the study ofm July of 2020. 1.3 cm lesion with low-density in the spleen at the periphery measuring water density on image 80 of series 4 at 13 mm as compared to 12 mm on the previous study. Smaller lesion seen inferior to this may have enlarged slightly but also measures water density. Imaged portions of the liver, gallbladder, pancreas and adrenal glands are normal. Musculoskeletal: No acute bone finding. No destructive bone process. Spinal degenerative changes. Review of the MIP images confirms the above findings. IMPRESSION: 1. Negative for acute pulmonary embolism. 2. Atelectasis and mild air trapping, could reflect small airways disease. 3. Mild-to-moderate generalized aortic atherosclerosis without aneurysm.  Three-vessel coronary artery calcification. 4. Low-density splenic lesions are similar to the study of July of 2020. One of these may have enlarged slightly but also measures water density. Findings are likely small cysts. 5. Aortic atherosclerosis. Aortic Atherosclerosis (ICD10-I70.0). Electronically Signed   By: GZetta BillsM.D.   On: 12/23/2020 15:06   Labs on Admission: I have personally reviewed following labs  CBC: Recent Labs  Lab 12/23/20 1200  WBC 10.6*  HGB 15.1  HCT 48.3  MCV 86.4  PLT 4638   Basic Metabolic Panel: Recent Labs  Lab 12/23/20 1200  NA 140  K 4.7  CL 110  CO2 25  GLUCOSE 120*  BUN 20  CREATININE 1.38*  CALCIUM 8.8*   GFR: Estimated Creatinine Clearance: 47 mL/min (A) (by C-G formula based on SCr of 1.38 mg/dL (H)).  Coagulation Profile: Recent Labs  Lab 12/23/20 1322  INR 1.7*   Urine analysis:    Component Value Date/Time   COLORURINE YELLOW (A) 03/27/2019 0637   APPEARANCEUR CLEAR (A) 03/27/2019 0637   APPEARANCEUR Clear 10/25/2014 2206   LABSPEC 1.019 03/27/2019 0637   LABSPEC 1.016 10/25/2014 2206   PHURINE 6.0 03/27/2019 09373  GLUCOSEU NEGATIVE 03/27/2019 0637   GLUCOSEU >=500 10/25/2014 2206   HGBUR NEGATIVE 03/27/2019 [place] 03/27/2019 0637   BILIRUBINUR Negative 10/25/2014 2206   [place] 03/27/2019 0637   PROTEINUR NEGATIVE 03/27/2019 0637   NITRITE NEGATIVE 03/27/2019 0637   LEUKOCYTESUR TRACE (A) 03/27/2019 0637   LEUKOCYTESUR Negative 10/25/2014 2206   Niva Murren N Billie Intriago D.O. [facility]  If 7PM-7AM, please contact overnight-coverage provider If 7AM-7PM, please contact day coverage provider [email or web address]  12/23/2020, 8:03 PM

## 2020-12-24 ENCOUNTER — Encounter: Payer: Self-pay | Admitting: Internal Medicine

## 2020-12-24 ENCOUNTER — Encounter
Admission: EM | Disposition: A | Discharge status: Other Acute Inpatient Hospital | Payer: Self-pay | Source: Ambulatory Visit | Attending: Internal Medicine

## 2020-12-24 DIAGNOSIS — T451X5A Adverse effect of antineoplastic and immunosuppressive drugs, initial encounter: Secondary | ICD-10-CM | POA: Diagnosis present

## 2020-12-24 DIAGNOSIS — I2510 Atherosclerotic heart disease of native coronary artery without angina pectoris: Secondary | ICD-10-CM | POA: Diagnosis present

## 2020-12-24 DIAGNOSIS — Z79890 Hormone replacement therapy (postmenopausal): Secondary | ICD-10-CM | POA: Diagnosis not present

## 2020-12-24 DIAGNOSIS — E1169 Type 2 diabetes mellitus with other specified complication: Secondary | ICD-10-CM | POA: Diagnosis present

## 2020-12-24 DIAGNOSIS — I214 Non-ST elevation (NSTEMI) myocardial infarction: Secondary | ICD-10-CM | POA: Diagnosis present

## 2020-12-24 DIAGNOSIS — M1990 Unspecified osteoarthritis, unspecified site: Secondary | ICD-10-CM | POA: Diagnosis present

## 2020-12-24 DIAGNOSIS — E1140 Type 2 diabetes mellitus with diabetic neuropathy, unspecified: Secondary | ICD-10-CM | POA: Diagnosis present

## 2020-12-24 DIAGNOSIS — Z86718 Personal history of other venous thrombosis and embolism: Secondary | ICD-10-CM | POA: Diagnosis not present

## 2020-12-24 DIAGNOSIS — R079 Chest pain, unspecified: Secondary | ICD-10-CM | POA: Diagnosis not present

## 2020-12-24 DIAGNOSIS — Z7901 Long term (current) use of anticoagulants: Secondary | ICD-10-CM | POA: Diagnosis not present

## 2020-12-24 DIAGNOSIS — E039 Hypothyroidism, unspecified: Secondary | ICD-10-CM | POA: Diagnosis present

## 2020-12-24 DIAGNOSIS — Z8551 Personal history of malignant neoplasm of bladder: Secondary | ICD-10-CM | POA: Diagnosis not present

## 2020-12-24 DIAGNOSIS — I1310 Hypertensive heart and chronic kidney disease without heart failure, with stage 1 through stage 4 chronic kidney disease, or unspecified chronic kidney disease: Secondary | ICD-10-CM | POA: Diagnosis present

## 2020-12-24 DIAGNOSIS — Z85828 Personal history of other malignant neoplasm of skin: Secondary | ICD-10-CM | POA: Diagnosis not present

## 2020-12-24 DIAGNOSIS — M2550 Pain in unspecified joint: Secondary | ICD-10-CM | POA: Diagnosis present

## 2020-12-24 DIAGNOSIS — E291 Testicular hypofunction: Secondary | ICD-10-CM | POA: Diagnosis present

## 2020-12-24 DIAGNOSIS — K219 Gastro-esophageal reflux disease without esophagitis: Secondary | ICD-10-CM | POA: Diagnosis present

## 2020-12-24 DIAGNOSIS — E1122 Type 2 diabetes mellitus with diabetic chronic kidney disease: Secondary | ICD-10-CM | POA: Diagnosis present

## 2020-12-24 DIAGNOSIS — N1831 Chronic kidney disease, stage 3a: Secondary | ICD-10-CM | POA: Diagnosis present

## 2020-12-24 DIAGNOSIS — Z66 Do not resuscitate: Secondary | ICD-10-CM | POA: Diagnosis present

## 2020-12-24 DIAGNOSIS — E782 Mixed hyperlipidemia: Secondary | ICD-10-CM | POA: Diagnosis present

## 2020-12-24 DIAGNOSIS — Z86711 Personal history of pulmonary embolism: Secondary | ICD-10-CM | POA: Diagnosis not present

## 2020-12-24 DIAGNOSIS — Z20822 Contact with and (suspected) exposure to covid-19: Secondary | ICD-10-CM | POA: Diagnosis present

## 2020-12-24 DIAGNOSIS — Z79899 Other long term (current) drug therapy: Secondary | ICD-10-CM | POA: Diagnosis not present

## 2020-12-24 DIAGNOSIS — G620 Drug-induced polyneuropathy: Secondary | ICD-10-CM | POA: Diagnosis present

## 2020-12-24 HISTORY — PX: LEFT HEART CATH AND CORONARY ANGIOGRAPHY: CATH118249

## 2020-12-24 LAB — BASIC METABOLIC PANEL
Anion gap: 6 (ref 5–15)
BUN: 18 mg/dL (ref 8–23)
CO2: 25 mmol/L (ref 22–32)
Calcium: 8.2 mg/dL — ABNORMAL LOW (ref 8.9–10.3)
Chloride: 108 mmol/L (ref 98–111)
Creatinine, Ser: 1.28 mg/dL — ABNORMAL HIGH (ref 0.61–1.24)
GFR, Estimated: 59 mL/min — ABNORMAL LOW (ref >60)
Glucose, Bld: 96 mg/dL (ref 70–99)
Potassium: 4 mmol/L (ref 3.5–5.1)
Sodium: 139 mmol/L (ref 135–145)

## 2020-12-24 LAB — CBC
HCT: 48 % (ref 39.0–52.0)
Hemoglobin: 15.1 g/dL (ref 13.0–17.0)
MCH: 27.1 pg (ref 26.0–34.0)
MCHC: 31.5 g/dL (ref 30.0–36.0)
MCV: 86 fL (ref 80.0–100.0)
Platelets: 382 10*3/uL (ref 150–400)
RBC: 5.58 MIL/uL (ref 4.22–5.81)
RDW: 15.7 % — ABNORMAL HIGH (ref 11.5–15.5)
WBC: 8.9 10*3/uL (ref 4.0–10.5)
nRBC: 0 % (ref 0.0–0.2)

## 2020-12-24 LAB — LIPID PANEL
Cholesterol: 174 mg/dL (ref 0–200)
HDL: 32 mg/dL — ABNORMAL LOW (ref >40)
LDL Cholesterol: 119 mg/dL — ABNORMAL HIGH (ref 0–99)
Total CHOL/HDL Ratio: 5.4 RATIO
Triglycerides: 113 mg/dL (ref <150)
VLDL: 23 mg/dL (ref 0–40)

## 2020-12-24 LAB — HEPARIN LEVEL (UNFRACTIONATED)
Heparin Unfractionated: 0.36 IU/mL (ref 0.30–0.70)
Heparin Unfractionated: <0.1 IU/mL — ABNORMAL LOW (ref 0.30–0.70)

## 2020-12-24 LAB — TROPONIN I (HIGH SENSITIVITY)
Troponin I (High Sensitivity): 668 ng/L (ref <18)
Troponin I (High Sensitivity): 543 ng/L (ref <18)

## 2020-12-24 SURGERY — LEFT HEART CATH AND CORONARY ANGIOGRAPHY
Anesthesia: Moderate Sedation

## 2020-12-24 MED ORDER — VITAMIN D 25 MCG (1000 UNIT) PO TABS
1000.0000 [IU] | ORAL_TABLET | Freq: Every day | ORAL | Status: DC
  Administered 2020-12-25: 1000 [IU] via ORAL
  Filled 2020-12-24: qty 1

## 2020-12-24 MED ORDER — SODIUM CHLORIDE 0.9% FLUSH
3.0000 mL | Freq: Two times a day (BID) | INTRAVENOUS | Status: DC
  Administered 2020-12-25: 3 mL via INTRAVENOUS

## 2020-12-24 MED ORDER — SODIUM CHLORIDE 0.9 % WEIGHT BASED INFUSION: 1.0000 mL/kg/h | INTRAVENOUS | Status: DC

## 2020-12-24 MED ORDER — ACETAMINOPHEN 325 MG PO TABS
650.0000 mg | ORAL_TABLET | ORAL | Status: DC | PRN
  Administered 2020-12-24: 650 mg via ORAL

## 2020-12-24 MED ORDER — HYDRALAZINE HCL 20 MG/ML IJ SOLN: 10.0000 mg | INTRAMUSCULAR | Status: AC | PRN

## 2020-12-24 MED ORDER — HEPARIN SODIUM (PORCINE) 1000 UNIT/ML IJ SOLN
INTRAMUSCULAR | Status: AC
  Filled 2020-12-24: qty 1

## 2020-12-24 MED ORDER — ATORVASTATIN CALCIUM 20 MG PO TABS
40.0000 mg | ORAL_TABLET | Freq: Every day | ORAL | Status: DC
  Administered 2020-12-25: 40 mg via ORAL
  Filled 2020-12-24: qty 2

## 2020-12-24 MED ORDER — NIACIN ER 500 MG PO TBCR
500.0000 mg | EXTENDED_RELEASE_TABLET | Freq: Every day | ORAL | Status: DC
  Administered 2020-12-24: 500 mg via ORAL
  Filled 2020-12-24 (×2): qty 1

## 2020-12-24 MED ORDER — HEPARIN SODIUM (PORCINE) 1000 UNIT/ML IJ SOLN
INTRAMUSCULAR | Status: DC | PRN
  Administered 2020-12-24: 3500 [IU] via INTRAVENOUS

## 2020-12-24 MED ORDER — HYDROCODONE-ACETAMINOPHEN 5-325 MG PO TABS
1.0000 | ORAL_TABLET | Freq: Four times a day (QID) | ORAL | Status: DC | PRN
  Administered 2020-12-24 – 2020-12-25 (×2): 1 via ORAL
  Filled 2020-12-24 (×2): qty 1

## 2020-12-24 MED ORDER — VERAPAMIL HCL 2.5 MG/ML IV SOLN
INTRAVENOUS | Status: AC
  Filled 2020-12-24: qty 2

## 2020-12-24 MED ORDER — HEPARIN (PORCINE) IN NACL 2000-0.9 UNIT/L-% IV SOLN
INTRAVENOUS | Status: DC | PRN
  Administered 2020-12-24: 1000 mL

## 2020-12-24 MED ORDER — FENTANYL CITRATE (PF) 100 MCG/2ML IJ SOLN
INTRAMUSCULAR | Status: DC | PRN
  Administered 2020-12-24: 25 ug via INTRAVENOUS
  Administered 2020-12-24: 50 ug via INTRAVENOUS

## 2020-12-24 MED ORDER — LORATADINE 10 MG PO TABS
10.0000 mg | ORAL_TABLET | Freq: Every day | ORAL | Status: DC
  Administered 2020-12-25: 10 mg via ORAL
  Filled 2020-12-24: qty 1

## 2020-12-24 MED ORDER — SODIUM CHLORIDE 0.9% FLUSH: 3.0000 mL | INTRAVENOUS | Status: DC | PRN

## 2020-12-24 MED ORDER — ONDANSETRON HCL 4 MG/2ML IJ SOLN: 4.0000 mg | Freq: Four times a day (QID) | INTRAMUSCULAR | Status: DC | PRN

## 2020-12-24 MED ORDER — SODIUM CHLORIDE 0.9 % IV SOLN: 250.0000 mL | INTRAVENOUS | Status: DC | PRN

## 2020-12-24 MED ORDER — IOHEXOL 300 MG/ML  SOLN
INTRAMUSCULAR | Status: DC | PRN
  Administered 2020-12-24: 310 mL

## 2020-12-24 MED ORDER — LABETALOL HCL 5 MG/ML IV SOLN: 10.0000 mg | INTRAVENOUS | Status: AC | PRN

## 2020-12-24 MED ORDER — FENTANYL CITRATE (PF) 100 MCG/2ML IJ SOLN
INTRAMUSCULAR | Status: AC
  Filled 2020-12-24: qty 2

## 2020-12-24 MED ORDER — ACETAMINOPHEN 325 MG PO TABS
ORAL_TABLET | ORAL | Status: AC
  Filled 2020-12-24: qty 2

## 2020-12-24 MED ORDER — VERAPAMIL HCL 2.5 MG/ML IV SOLN
INTRAVENOUS | Status: DC | PRN
  Administered 2020-12-24 (×2): 2.5 mg via INTRAVENOUS

## 2020-12-24 MED ORDER — MIDAZOLAM HCL 2 MG/2ML IJ SOLN
INTRAMUSCULAR | Status: AC
  Filled 2020-12-24: qty 2

## 2020-12-24 MED ORDER — MIDAZOLAM HCL 2 MG/2ML IJ SOLN
INTRAMUSCULAR | Status: DC | PRN
  Administered 2020-12-24: 1 mg via INTRAVENOUS

## 2020-12-24 SURGICAL SUPPLY — 15 items
CATH INFINITI 5 FR 3DRC (CATHETERS) ×2 IMPLANT
CATH INFINITI 5 FR JL3.5 (CATHETERS) ×2 IMPLANT
CATH INFINITI 5FR ANG PIGTAIL (CATHETERS) ×2 IMPLANT
CATH INFINITI 5FR JK (CATHETERS) ×2 IMPLANT
CATH INFINITI JR4 5F (CATHETERS) ×2 IMPLANT
DEVICE RAD TR BAND REGULAR (VASCULAR PRODUCTS) ×2 IMPLANT
DRAPE BRACHIAL (DRAPES) ×2 IMPLANT
GLIDESHEATH SLEND SS 6F .021 (SHEATH) ×2 IMPLANT
GUIDEWIRE INQWIRE 1.5J.035X260 (WIRE) ×1 IMPLANT
INQWIRE 1.5J .035X260CM (WIRE) ×2
PACK CARDIAC CATH (CUSTOM PROCEDURE TRAY) ×2 IMPLANT
PROTECTION STATION PRESSURIZED (MISCELLANEOUS) ×2
SET ATX SIMPLICITY (MISCELLANEOUS) ×2 IMPLANT
SHEATH 6FR 75 DEST SLENDER (SHEATH) ×2 IMPLANT
STATION PROTECTION PRESSURIZED (MISCELLANEOUS) ×1 IMPLANT

## 2020-12-24 NOTE — Progress Notes (Signed)
Patient complaining of a headache due to not eating anything for 24 hours. Patient given po Tylenol. Tolerated post procedure meal tray. Wife at bedside, assists patient with condiments in the tray. Patient remains pleasant and amenable to treatment plan. Will continue to monitor.

## 2020-12-24 NOTE — Consult Note (Signed)
[facility] for Heparin Indication: chest pain/ACS  Patient Measurements: Heparin Dosing Weight: 76 kg  Labs: Recent Labs    12/23/20 1200 12/23/20 1322 12/23/20 1340 12/23/20 1716 12/23/20 2140 12/24/20 0526  HGB 15.1  --   --   --   --  15.1  HCT 48.3  --   --   --   --  48.0  PLT 418*  --   --   --   --  382  APTT  --   --   --  117*  --   --   LABPROT  --  19.3*  --   --   --   --   INR  --  1.7*  --   --   --   --   HEPARINUNFRC  --   --   --   --  0.10* 0.36  CREATININE 1.38*  --   --   --   --  1.28*  TROPONINIHS 192*  --  637*  --   --   --     Estimated Creatinine Clearance: 50.6 mL/min (A) (by C-G formula based on SCr of 1.28 mg/dL (H)).   Medical History: Past Medical History:  Diagnosis Date  . Arthritis   . Basal cell carcinoma    L clavicle, L prox forearm- removed years ago   . Bladder cancer ([place])   . Bladder neck contracture   . Cataract   . DVT (deep venous thrombosis) ([place]) 11/25/2018   right le  . Dyslipidemia   . ED (erectile dysfunction)   . Frequency   . GERD (gastroesophageal reflux disease)   . History of pulmonary embolus (PE)   . Hypertension   . Hypothyroidism   . Incontinence of urine    sui, s/p cryoablation  . Kidney stones   . Neuropathy    feet  . Nocturia   . Prostate cancer ([place])    S/P   CRYOABLATION  . Right elbow tendinitis   . Vertigo    1-2x/yr  . Wears glasses    Heparin Dosing Weight: 76 kg  Assessment: 75 yo male with h/o bladder cancer (s/p cryoablation TURP), HTN, NIDDM, PE (provoked postop) who presents with substernal chest discomfort. Pharmacy consulted to start heparin for ACS/NSTEMI. Trops elevated 192>637. On chronic warfarin for DVT/PE; INR on admit: 1.7.  Date Time HL Rate/Comment 4/6 2140 0.10 subthera, 900 >1200 un/hr 4/7 0526 0.36 thera x1; 1200 un/hr  H/H/Plts: WNL   Goal of Therapy:  Heparin level 0.3-0.7 units/ml Monitor platelets by anticoagulation  protocol: Yes   Plan:  --Heparin level is therapeutic. Continue heparin infusion at 1200 units/hr --Re-check HL 8 hours to confirm rate then daily. --Daily CBC per protocol while on IV heparin  Lorna Dibble 12/24/2020,7:31 AM

## 2020-12-24 NOTE — Progress Notes (Signed)
[facility] NAME: Phillip [name]    MR#:  [id number]  DATE OF BIRTH:  27-Aug-1946  SUBJECTIVE:  after having chest pressure radiating to the arm while doing some yard work. Wife in the room. Patient had uneventful night. Denies any chest pain. Complains of lower extremity burning sensation with neuropathy. Tells me gabapentin did not work. Takes diclofenac po  three times a day.  REVIEW OF SYSTEMS:   Review of Systems  Constitutional: Negative for chills, fever and weight loss.  HENT: Negative for ear discharge, ear pain and nosebleeds.   Eyes: Negative for blurred vision, pain and discharge.  Respiratory: Negative for sputum production, shortness of breath, wheezing and stridor.   Cardiovascular: Negative for chest pain, palpitations, orthopnea and PND.  Gastrointestinal: Negative for abdominal pain, diarrhea, nausea and vomiting.  Genitourinary: Negative for frequency and urgency.  Musculoskeletal: Negative for back pain and joint pain.  Neurological: Positive for tingling. Negative for sensory change, speech change, focal weakness and weakness.  Psychiatric/Behavioral: Negative for depression and hallucinations. The patient is not nervous/anxious.    Tolerating Diet:npo Tolerating PT:   DRUG ALLERGIES:   Allergies  Allergen Reactions  . Doxazosin Other (See Comments)    Other reaction(s): Unknown    VITALS:  Blood pressure (!) 149/82, pulse 62, temperature 98.3 F (36.8 C), temperature source Oral, resp. rate 16, height 5' 9" (1.753 m), weight 75.5 kg, SpO2 100 %.  PHYSICAL EXAMINATION:   Physical Exam  GENERAL:  75 y.o.-year-old patient lying in the bed with no acute distress.  HEENT: Head atraumatic, normocephalic. Oropharynx and nasopharynx clear.   LUNGS: Normal breath sounds bilaterally, no wheezing, rales, rhonchi. No use of accessory muscles of respiration.  CARDIOVASCULAR: S1, S2 normal. No murmurs, rubs, or  gallops.  ABDOMEN: Soft, nontender, nondistended. Bowel sounds present. No organomegaly or mass.  EXTREMITIES: No cyanosis, clubbing or edema b/l.    NEUROLOGIC: Cranial nerves II through XII are intact. No focal Motor or sensory deficits b/l.   PSYCHIATRIC:  patient is alert and oriented x 3.  SKIN: No obvious rash, lesion, or ulcer.   LABORATORY PANEL:  CBC Recent Labs  Lab 12/24/20 0526  WBC 8.9  HGB 15.1  HCT 48.0  PLT 382    Chemistries  Recent Labs  Lab 12/24/20 0526  NA 139  K 4.0  CL 108  CO2 25  GLUCOSE 96  BUN 18  CREATININE 1.28*  CALCIUM 8.2*   Cardiac Enzymes No results for input(s): TROPONINI in the last 168 hours. RADIOLOGY:  DG Chest 2 View  Result Date: 12/23/2020 CLINICAL DATA:  Chest pain EXAM: CHEST - 2 VIEW COMPARISON:  None. FINDINGS: The heart size and mediastinal contours are within normal limits. Both lungs are clear. The visualized skeletal structures are unremarkable. IMPRESSION: No active cardiopulmonary disease. Electronically Signed   By: Franchot Gallo M.D.   On: 12/23/2020 12:54   CT Angio Chest PE W/Cm &/Or Wo Cm  Result Date: 12/23/2020 CLINICAL DATA:  Suspected pulmonary embolism, high probability in a 76 year old male. EXAM: CT ANGIOGRAPHY CHEST WITH CONTRAST TECHNIQUE: Multidetector CT imaging of the chest was performed using the standard protocol during bolus administration of intravenous contrast. Multiplanar CT image reconstructions and MIPs were obtained to evaluate the vascular anatomy. CONTRAST:  28m OMNIPAQUE IOHEXOL 350 MG/ML SOLN COMPARISON:  December 07, 2018 FINDINGS: Cardiovascular: Mild cardiac enlargement. No pericardial effusion. Three-vessel coronary artery calcification. Mild-to-moderate generalized aortic atherosclerosis without aneurysm.  Normal caliber central pulmonary vessels. Negative for acute pulmonary embolism. Mediastinum/Nodes: Thoracic inlet structures are normal. No axillary lymphadenopathy. No mediastinal  lymphadenopathy. No hilar lymphadenopathy. Esophagus grossly normal. Lungs/Pleura: Basilar atelectasis. Mild air trapping. No consolidation. No pleural effusion. Upper Abdomen: Low-density splenic lesions are similar to the study ofm July of 2020. 1.3 cm lesion with low-density in the spleen at the periphery measuring water density on image 80 of series 4 at 13 mm as compared to 12 mm on the previous study. Smaller lesion seen inferior to this may have enlarged slightly but also measures water density. Imaged portions of the liver, gallbladder, pancreas and adrenal glands are normal. Musculoskeletal: No acute bone finding. No destructive bone process. Spinal degenerative changes. Review of the MIP images confirms the above findings. IMPRESSION: 1. Negative for acute pulmonary embolism. 2. Atelectasis and mild air trapping, could reflect small airways disease. 3. Mild-to-moderate generalized aortic atherosclerosis without aneurysm. Three-vessel coronary artery calcification. 4. Low-density splenic lesions are similar to the study of July of 2020. One of these may have enlarged slightly but also measures water density. Findings are likely small cysts. 5. Aortic atherosclerosis. Aortic Atherosclerosis (ICD10-I70.0). Electronically Signed   By: Zetta Bills M.D.   On: 12/23/2020 15:06   ASSESSMENT AND PLAN:  Phillip [name] is a 75 y.o. male with medical history significant for history of bladder cancer status post cryoablation and TURP, hypertension, hyperlipidemia, non-insulin-dependent diabetes mellitus, history of postoperative pulmonary embolism, hypogonadism taking testosterone IM injection, presents emergency department for chief concerns of chest pressure.   NSTEMI -- patient came in with chest pressure radiating to his arm. No previous history of coronary artery disease. Positive family history of MI --CTA evidence of three-vessel disease, no PE -High-sensitivity troponin was 192 and increased to  637 -Heparin GTT -Cardiology, Dr. 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   DVT prophylaxis: Heparin GTT Code Status: partial code Family Communication wife at bedside Disposition Plan: Pending clinical course Consults called: Cardiology Admission status: Progressive cardiac    Patient came in with chest pain. Cardiology consultation recommends cardiac cath which will be done later today. Discharge pending cardiac cath and further recommendations from cardiology  Dispo: The patient is from: Home              Anticipated d/c is to: Home              Patient currently is not medically stable to d/c.   Difficult to place patient No        TOTAL TIME TAKING CARE OF THIS PATIENT: 30 minutes.  >50% time spent on counselling and coordination of care  Note: This dictation was prepared with Dragon dictation along with smaller phrase technology. Any transcriptional errors that result from this process are unintentional.  Fritzi Mandes M.D    [facility]   CC: Primary care physician; Rusty Aus, MDPatient ID: Phillip [name], male   DOB: 09-Sep-1946, 75 y.o.   MRN: [id number]

## 2020-12-24 NOTE — Consult Note (Signed)
CARDIOLOGY CONSULT NOTE               Patient ID: Phillip [name] MRN: [id number] DOB/AGE: 10-01-1945 75 y.o.  Admit date: 12/23/2020 Referring Physician Dr. Rupert Stacks hospitalist Primary Physician Dr. Emily Filbert primary Primary Cardiologist none Reason for Consultation non-STEMI  HPI: Patient is a 75 year old white male history of bladder cancer status post cryoablation TURP he has history of hypertension hypokalemia non-insulin-dependent diabetes pulmonary embolus postoperatively states has been doing reasonably well but started having substernal chest discomfort.  Patient states the chest discomfort and pressure was moderate to severe while shoveling dirt he had to stop and rest and then denies any radiation to his back or his neck or arms he has a history of neuropathy so he has trouble with pain sensation of the time but this midsternal chest discomfort was different so he finally came to emergency room for evaluation.  Enzymes subsequently elevated to over 600 so required admission and further evaluation by cardiology  Review of systems complete and found to be negative unless listed above     Past Medical History:  Diagnosis Date  . Arthritis   . Basal cell carcinoma    L clavicle, L prox forearm- removed years ago   . Bladder cancer ([place])   . Bladder neck contracture   . Cataract   . DVT (deep venous thrombosis) ([place]) 11/25/2018   right le  . Dyslipidemia   . ED (erectile dysfunction)   . Frequency   . GERD (gastroesophageal reflux disease)   . History of pulmonary embolus (PE)   . Hypertension   . Hypothyroidism   . Incontinence of urine    sui, s/p cryoablation  . Kidney stones   . Neuropathy    feet  . Nocturia   . Prostate cancer ([place])    S/P   CRYOABLATION  . Right elbow tendinitis   . Vertigo    1-2x/yr  . Wears glasses     Past Surgical History:  Procedure Laterality Date  . ARTHROPLASTY AND TENDON REPAIR LEFT THUMB  JAN 2014  . CATARACT  EXTRACTION W/PHACO Left 11/16/2020   Procedure: CATARACT EXTRACTION PHACO AND INTRAOCULAR LENS PLACEMENT (IOC) LEFT  7.09 00:56.4 12.6%;  Surgeon: Leandrew Koyanagi, MD;  Location: [place];  Service: Ophthalmology;  Laterality: Left;  . CATARACT EXTRACTION W/PHACO Right 12/02/2020   Procedure: CATARACT EXTRACTION PHACO AND INTRAOCULAR LENS PLACEMENT (IOC) RIGHT MALYUGIN 5.52 00:49.7 11.1%;  Surgeon: Leandrew Koyanagi, MD;  Location: [place];  Service: Ophthalmology;  Laterality: Right;  . COLONOSCOPY    . COLONOSCOPY WITH PROPOFOL N/A 08/09/2017   Procedure: COLONOSCOPY WITH PROPOFOL;  Surgeon: Manya Silvas, MD;  Location: [facility] ENDOSCOPY;  Service: Endoscopy;  Laterality: N/A;  . ESOPHAGOGASTRODUODENOSCOPY (EGD) WITH PROPOFOL N/A 08/09/2017   Procedure: ESOPHAGOGASTRODUODENOSCOPY (EGD) WITH PROPOFOL;  Surgeon: Manya Silvas, MD;  Location: [facility] ENDOSCOPY;  Service: Endoscopy;  Laterality: N/A;  . GREEN LIGHT LASER TURP (TRANSURETHRAL RESECTION OF PROSTATE N/A 12/14/2015   Procedure: GREEN LIGHT LASER TURP (TRANSURETHRAL RESECTION OF PROSTATE) LASER OF BLADDER NECK CONTRACTURE;  Surgeon: Carolan Clines, MD;  Location: [place];  Service: Urology;  Laterality: N/A;  . PENILE PROSTHESIS IMPLANT N/A 12/06/2013   Procedure: PENILE PROTHESIS INFLATABLE;  Surgeon: Ailene Rud, MD;  Location: [facility];  Service: Urology;  Laterality: N/A;  . PROSTATE CRYOABLATION  06-01-2012  [facility]  . TRANSURETHRAL RESECTION OF BLADDER NECK N/A 03/20/2015   Procedure: RELEASE BLADDER  NECK CONTRACTURE  WITH WOLF BUTTON ELECTRODE ;  Surgeon: Carolan Clines, MD;  Location: [facility];  Service: Urology;  Laterality: N/A;  . TRANSURETHRAL RESECTION OF BLADDER TUMOR N/A 12/05/2018   Procedure: TRANSURETHRAL RESECTION OF BLADDER TUMOR (TURBT)/CYSTOSCOPY/  INSTILLATION OF Rodell Perna;  Surgeon: Ceasar Mons, MD;   Location[facility];  Service: Urology;  Laterality: N/A;  . TRANSURETHRAL RESECTION OF BLADDER TUMOR N/A 01/15/2020   Procedure: TRANSURETHRAL RESECTION OF BLADDER TUMOR (TURBT)/ CYSTOSCOPY;  Surgeon: Ceasar Mons, MD;  Location: [facility];  Service: Urology;  Laterality: N/A;  . TRIGGER FINGER RELEASE Left 10/2015  . ulner nerve neuropathy      Medications Prior to Admission  Medication Sig Dispense Refill Last Dose  . acetaminophen (TYLENOL) 500 MG tablet Take 500 mg by mouth every 6 (six) hours as needed.   Past Week at Unknown time  . amLODipine (NORVASC) 5 MG tablet Take 5 mg by mouth daily.   12/23/2020 at 0800  . benazepril (LOTENSIN) 20 MG tablet Take 20 mg by mouth daily.   12/23/2020 at 0800  . cetirizine (ZYRTEC) 10 MG tablet Take 10 mg by mouth 2 (two) times daily.    12/23/2020 at 0800  . cholecalciferol (VITAMIN D3) 25 MCG (1000 UT) tablet Take 1,000 Units by mouth daily.   12/23/2020 at 0800  . Chromium 1 MG CAPS Take 1 mg by mouth daily. 1 per day   12/23/2020 at 0800  . diclofenac (VOLTAREN) 75 MG EC tablet Take 75 mg by mouth 2 (two) times daily.   12/23/2020 at 0800  . Glucosamine-Chondroit-Vit C-Mn (GLUCOSAMINE CHONDR 1500 COMPLX PO) Take 1 tablet by mouth daily.   12/23/2020 at 0800  . levothyroxine (SYNTHROID) 50 MCG tablet Take 50 mcg by mouth.   12/23/2020 at 0500  . Multiple Vitamin (MULTIVITAMIN) tablet Take 1 tablet by mouth daily.   12/23/2020 at 0800  . niacin 500 MG tablet Take 500 mg by mouth at bedtime.   12/22/2020 at 1800  . omeprazole (PRILOSEC) 40 MG capsule Take 40 mg by mouth daily.    12/23/2020 at 0800  . potassium phosphate, monobasic, (K-PHOS ORIGINAL) 500 MG tablet Take 500 mg by mouth daily.   12/22/2020 at 1800  . TURMERIC PO Take 1 Dose by mouth daily.    12/23/2020 at 0800  . warfarin (COUMADIN) 4 MG tablet Take 4 mg by mouth daily.   12/22/2020 at 1800  . gabapentin (NEURONTIN) 300 MG capsule Take 1 capsule by mouth at bedtime.  (Patient not taking: No sig reported)   Not Taking at Unknown time  . phenazopyridine (PYRIDIUM) 200 MG tablet Take 1 tablet (200 mg total) by mouth 3 (three) times daily as needed (for pain with urination). (Patient not taking: No sig reported) 30 tablet 0   . sertraline (ZOLOFT) 50 MG tablet Take 50 mg by mouth daily. (Patient not taking: No sig reported)     . testosterone cypionate (DEPOTESTOSTERONE CYPIONATE) 200 MG/ML injection Inject 200 mg into the muscle every 14 (fourteen) days.    12/18/2020  . venlafaxine XR (EFFEXOR-XR) 37.5 MG 24 hr capsule Take 37.5 mg by mouth daily. (Patient not taking: No sig reported)   Not Taking at Unknown time   Social History   Socioeconomic History  . Marital status: Married    Spouse name: Not on file  . Number of children: Not on file  . Years of education: Not on file  . Highest  education level: Not on file  Occupational History  . Not on file  Tobacco Use  . Smoking status: Never Smoker  . Smokeless tobacco: Never Used  Vaping Use  . Vaping Use: Never used  Substance and Sexual Activity  . Alcohol use: No  . Drug use: No  . Sexual activity: Not Currently  Other Topics Concern  . Not on file  Social History Narrative  . Not on file   Social Determinants of Health   Financial Resource Strain: Not on file  Food Insecurity: Not on file  Transportation Needs: Not on file  Physical Activity: Not on file  Stress: Not on file  Social Connections: Not on file  Intimate Partner Violence: Not on file    History reviewed. No pertinent family history.    Review of systems complete and found to be negative unless listed above      PHYSICAL EXAM  General: Well developed, well nourished, in no acute distress HEENT:  Normocephalic and atramatic Neck:  No JVD.  Lungs: Clear bilaterally to auscultation and percussion. Heart: HRRR . Normal S1 and S2 without gallops or murmurs.  Abdomen: Bowel sounds are positive, abdomen soft and  non-tender  Msk:  Back normal, normal gait. Normal strength and tone for age. Extremities: No clubbing, cyanosis or edema.   Neuro: Alert and oriented X 3. Psych:  Good affect, responds appropriately  Labs:   Lab Results  Component Value Date   WBC 8.9 12/24/2020   HGB 15.1 12/24/2020   HCT 48.0 12/24/2020   MCV 86.0 12/24/2020   PLT 382 12/24/2020    Recent Labs  Lab 12/24/20 0526  NA 139  K 4.0  CL 108  CO2 25  BUN 18  CREATININE 1.28*  CALCIUM 8.2*  GLUCOSE 96   Lab Results  Component Value Date   TROPONINI <0.03 03/09/2019   No results found for: CHOL No results found for: HDL No results found for: LDLCALC No results found for: TRIG No results found for: CHOLHDL No results found for: LDLDIRECT    Radiology: DG Chest 2 View  Result Date: 12/23/2020 CLINICAL DATA:  Chest pain EXAM: CHEST - 2 VIEW COMPARISON:  None. FINDINGS: The heart size and mediastinal contours are within normal limits. Both lungs are clear. The visualized skeletal structures are unremarkable. IMPRESSION: No active cardiopulmonary disease. Electronically Signed   By: Franchot Gallo M.D.   On: 12/23/2020 12:54   CT Angio Chest PE W/Cm &/Or Wo Cm  Result Date: 12/23/2020 CLINICAL DATA:  Suspected pulmonary embolism, high probability in a 75 year old male. EXAM: CT ANGIOGRAPHY CHEST WITH CONTRAST TECHNIQUE: Multidetector CT imaging of the chest was performed using the standard protocol during bolus administration of intravenous contrast. Multiplanar CT image reconstructions and MIPs were obtained to evaluate the vascular anatomy. CONTRAST:  4mL OMNIPAQUE IOHEXOL 350 MG/ML SOLN COMPARISON:  December 07, 2018 FINDINGS: Cardiovascular: Mild cardiac enlargement. No pericardial effusion. Three-vessel coronary artery calcification. Mild-to-moderate generalized aortic atherosclerosis without aneurysm. Normal caliber central pulmonary vessels. Negative for acute pulmonary embolism. Mediastinum/Nodes: Thoracic inlet  structures are normal. No axillary lymphadenopathy. No mediastinal lymphadenopathy. No hilar lymphadenopathy. Esophagus grossly normal. Lungs/Pleura: Basilar atelectasis. Mild air trapping. No consolidation. No pleural effusion. Upper Abdomen: Low-density splenic lesions are similar to the study ofm July of 2020. 1.3 cm lesion with low-density in the spleen at the periphery measuring water density on image 80 of series 4 at 13 mm as compared to 12 mm on the previous study. Smaller lesion  seen inferior to this may have enlarged slightly but also measures water density. Imaged portions of the liver, gallbladder, pancreas and adrenal glands are normal. Musculoskeletal: No acute bone finding. No destructive bone process. Spinal degenerative changes. Review of the MIP images confirms the above findings. IMPRESSION: 1. Negative for acute pulmonary embolism. 2. Atelectasis and mild air trapping, could reflect small airways disease. 3. Mild-to-moderate generalized aortic atherosclerosis without aneurysm. Three-vessel coronary artery calcification. 4. Low-density splenic lesions are similar to the study of July of 2020. One of these may have enlarged slightly but also measures water density. Findings are likely small cysts. 5. Aortic atherosclerosis. Aortic Atherosclerosis (ICD10-I70.0). Electronically Signed   By: Zetta Bills M.D.   On: 12/23/2020 15:06    EKG: Normal sinus rhythm nonspecific T2 changes rate of 70  ASSESSMENT AND PLAN:  NSTEMI CAD HTN CRI stage II GERD Diabetes type 2 diet controlled Neuropathy History of DVT Murmur History of chemo . Plan Agree with admit to telemetry Follow-up EKGs and troponins Hold Coumadin in anticipation of the possible invasive strategy Echocardiogram for evaluation LV function valvular structures Agree with IV heparin for anticoagulation Aspirin 81 mg daily Continue Benzapril for hypertension consider switching amlodipine to beta-blocker Continue  anticoagulation long-term because of DVT Statin therapy Lipitor or Crestor Agree with omeprazole therapy for reflux type symptoms Recommend cardiac cath prior to discharge for non-STEMI   Signed: Yolonda Kida MD 12/24/2020, 6:43 AM

## 2020-12-24 NOTE — Consult Note (Signed)
[facility] for Heparin Indication: chest pain/ACS  Patient Measurements: Heparin Dosing Weight: 76 kg  Labs: Recent Labs    12/23/20 1200 12/23/20 1322 12/23/20 1340 12/23/20 1716 12/23/20 2140 12/24/20 0526 12/24/20 0711 12/24/20 1036 12/24/20 2009  HGB 15.1  --   --   --   --  15.1  --   --   --   HCT 48.3  --   --   --   --  48.0  --   --   --   PLT 418*  --   --   --   --  382  --   --   --   APTT  --   --   --  117*  --   --   --   --   --   LABPROT  --  19.3*  --   --   --   --   --   --   --   INR  --  1.7*  --   --   --   --   --   --   --   HEPARINUNFRC  --   --   --   --  0.10* 0.36  --   --  <0.10*  CREATININE 1.38*  --   --   --   --  1.28*  --   --   --   TROPONINIHS 192*  --  637*  --   --   --  668* 543*  --     Estimated Creatinine Clearance: 50.6 mL/min (A) (by C-G formula based on SCr of 1.28 mg/dL (H)).   Medical History: Past Medical History:  Diagnosis Date  . Arthritis   . Basal cell carcinoma    L clavicle, L prox forearm- removed years ago   . Bladder cancer ([place])   . Bladder neck contracture   . Cataract   . DVT (deep venous thrombosis) ([place]) 11/25/2018   right le  . Dyslipidemia   . ED (erectile dysfunction)   . Frequency   . GERD (gastroesophageal reflux disease)   . History of pulmonary embolus (PE)   . Hypertension   . Hypothyroidism   . Incontinence of urine    sui, s/p cryoablation  . Kidney stones   . Neuropathy    feet  . Nocturia   . Prostate cancer ([place])    S/P   CRYOABLATION  . Right elbow tendinitis   . Vertigo    1-2x/yr  . Wears glasses    Heparin Dosing Weight: 76 kg  Assessment: 75 yo male with h/o bladder cancer (s/p cryoablation TURP), HTN, NIDDM, PE (provoked postop) who presents with substernal chest discomfort. Pharmacy consulted to start heparin for ACS/NSTEMI. Trops elevated 192>637. On chronic warfarin for DVT/PE; INR on admit:  1.7.  Date Time HL Rate/Comment 4/6 2140 0.10 subthera, 900 >1200 un/hr 4/7 0526 0.36 thera x1; 1200 un/hr  H/H/Plts: WNL  Goal of Therapy:  Heparin level 0.3-0.7 units/ml Monitor platelets by anticoagulation protocol: Yes   Plan:  --Patient went for cardiac catheterization today. Per RN, plan is for transfer for CABG evaluation --Heparin drip was re-started at [place] after procedure --Will re-check HL 8 hours from drip re-start --Daily CBC per protocol while on IV heparin  Benita Gutter 12/24/2020,9:49 PM

## 2020-12-25 ENCOUNTER — Inpatient Hospital Stay (HOSPITAL_COMMUNITY): Payer: Medicare Other

## 2020-12-25 ENCOUNTER — Inpatient Hospital Stay (HOSPITAL_COMMUNITY)
Admission: AD | Admit: 2020-12-25 | Discharge: 2020-12-29 | DRG: 247 | Disposition: A | Discharge status: Home / Self Care | Payer: Medicare Other | Source: Other Acute Inpatient Hospital | Attending: Cardiovascular Disease | Admitting: Cardiovascular Disease

## 2020-12-25 ENCOUNTER — Encounter: Payer: Self-pay | Admitting: Internal Medicine

## 2020-12-25 ENCOUNTER — Encounter (HOSPITAL_COMMUNITY): Payer: Self-pay | Admitting: Cardiovascular Disease

## 2020-12-25 DIAGNOSIS — Z79899 Other long term (current) drug therapy: Secondary | ICD-10-CM

## 2020-12-25 DIAGNOSIS — Z86711 Personal history of pulmonary embolism: Secondary | ICD-10-CM | POA: Diagnosis not present

## 2020-12-25 DIAGNOSIS — I255 Ischemic cardiomyopathy: Secondary | ICD-10-CM | POA: Diagnosis present

## 2020-12-25 DIAGNOSIS — I82461 Acute embolism and thrombosis of right calf muscular vein: Secondary | ICD-10-CM | POA: Diagnosis present

## 2020-12-25 DIAGNOSIS — E039 Hypothyroidism, unspecified: Secondary | ICD-10-CM | POA: Diagnosis present

## 2020-12-25 DIAGNOSIS — Z20822 Contact with and (suspected) exposure to covid-19: Secondary | ICD-10-CM | POA: Diagnosis present

## 2020-12-25 DIAGNOSIS — Z79890 Hormone replacement therapy (postmenopausal): Secondary | ICD-10-CM | POA: Diagnosis not present

## 2020-12-25 DIAGNOSIS — I129 Hypertensive chronic kidney disease with stage 1 through stage 4 chronic kidney disease, or unspecified chronic kidney disease: Secondary | ICD-10-CM | POA: Diagnosis present

## 2020-12-25 DIAGNOSIS — E785 Hyperlipidemia, unspecified: Secondary | ICD-10-CM | POA: Diagnosis present

## 2020-12-25 DIAGNOSIS — R079 Chest pain, unspecified: Secondary | ICD-10-CM | POA: Diagnosis not present

## 2020-12-25 DIAGNOSIS — Z8249 Family history of ischemic heart disease and other diseases of the circulatory system: Secondary | ICD-10-CM | POA: Diagnosis not present

## 2020-12-25 DIAGNOSIS — E1140 Type 2 diabetes mellitus with diabetic neuropathy, unspecified: Secondary | ICD-10-CM | POA: Diagnosis present

## 2020-12-25 DIAGNOSIS — I25110 Atherosclerotic heart disease of native coronary artery with unstable angina pectoris: Secondary | ICD-10-CM | POA: Diagnosis not present

## 2020-12-25 DIAGNOSIS — Z8546 Personal history of malignant neoplasm of prostate: Secondary | ICD-10-CM | POA: Diagnosis not present

## 2020-12-25 DIAGNOSIS — C61 Malignant neoplasm of prostate: Secondary | ICD-10-CM | POA: Diagnosis present

## 2020-12-25 DIAGNOSIS — N183 Chronic kidney disease, stage 3 unspecified: Secondary | ICD-10-CM | POA: Diagnosis present

## 2020-12-25 DIAGNOSIS — I10 Essential (primary) hypertension: Secondary | ICD-10-CM | POA: Diagnosis present

## 2020-12-25 DIAGNOSIS — Z7901 Long term (current) use of anticoagulants: Secondary | ICD-10-CM

## 2020-12-25 DIAGNOSIS — Z8551 Personal history of malignant neoplasm of bladder: Secondary | ICD-10-CM | POA: Diagnosis not present

## 2020-12-25 DIAGNOSIS — E1122 Type 2 diabetes mellitus with diabetic chronic kidney disease: Secondary | ICD-10-CM | POA: Diagnosis present

## 2020-12-25 DIAGNOSIS — I2510 Atherosclerotic heart disease of native coronary artery without angina pectoris: Secondary | ICD-10-CM | POA: Diagnosis present

## 2020-12-25 DIAGNOSIS — C679 Malignant neoplasm of bladder, unspecified: Secondary | ICD-10-CM | POA: Diagnosis present

## 2020-12-25 DIAGNOSIS — Z955 Presence of coronary angioplasty implant and graft: Secondary | ICD-10-CM

## 2020-12-25 DIAGNOSIS — I82409 Acute embolism and thrombosis of unspecified deep veins of unspecified lower extremity: Secondary | ICD-10-CM | POA: Diagnosis present

## 2020-12-25 DIAGNOSIS — K219 Gastro-esophageal reflux disease without esophagitis: Secondary | ICD-10-CM | POA: Diagnosis present

## 2020-12-25 DIAGNOSIS — I214 Non-ST elevation (NSTEMI) myocardial infarction: Principal | ICD-10-CM | POA: Diagnosis present

## 2020-12-25 DIAGNOSIS — Z86718 Personal history of other venous thrombosis and embolism: Secondary | ICD-10-CM | POA: Diagnosis not present

## 2020-12-25 DIAGNOSIS — R011 Cardiac murmur, unspecified: Secondary | ICD-10-CM

## 2020-12-25 DIAGNOSIS — N1831 Chronic kidney disease, stage 3a: Secondary | ICD-10-CM | POA: Diagnosis not present

## 2020-12-25 DIAGNOSIS — N1832 Chronic kidney disease, stage 3b: Secondary | ICD-10-CM | POA: Diagnosis present

## 2020-12-25 DIAGNOSIS — Z85828 Personal history of other malignant neoplasm of skin: Secondary | ICD-10-CM | POA: Diagnosis not present

## 2020-12-25 HISTORY — DX: Hyperlipidemia, unspecified: E78.5

## 2020-12-25 LAB — HEPARIN LEVEL (UNFRACTIONATED)
Heparin Unfractionated: 0.26 IU/mL — ABNORMAL LOW (ref 0.30–0.70)
Heparin Unfractionated: 0.28 IU/mL — ABNORMAL LOW (ref 0.30–0.70)

## 2020-12-25 LAB — ECHOCARDIOGRAM COMPLETE
AR max vel: 1.77 cm2
AV Area VTI: 1.82 cm2
AV Area mean vel: 1.87 cm2
AV Mean grad: 7 mmHg
AV Peak grad: 11.8 mmHg
Ao pk vel: 1.72 m/s
Area-P 1/2: 3.85 cm2
S' Lateral: 3.4 cm

## 2020-12-25 LAB — CBC
HCT: 49.5 % (ref 39.0–52.0)
Hemoglobin: 15.2 g/dL (ref 13.0–17.0)
MCH: 26.9 pg (ref 26.0–34.0)
MCHC: 30.7 g/dL (ref 30.0–36.0)
MCV: 87.6 fL (ref 80.0–100.0)
Platelets: 435 10*3/uL — ABNORMAL HIGH (ref 150–400)
RBC: 5.65 MIL/uL (ref 4.22–5.81)
RDW: 15.9 % — ABNORMAL HIGH (ref 11.5–15.5)
WBC: 14.4 10*3/uL — ABNORMAL HIGH (ref 4.0–10.5)
nRBC: 0 % (ref 0.0–0.2)

## 2020-12-25 MED ORDER — ACETAMINOPHEN 500 MG PO TABS
500.0000 mg | ORAL_TABLET | Freq: Four times a day (QID) | ORAL | Status: DC | PRN
  Administered 2020-12-25 – 2020-12-26 (×2): 500 mg via ORAL
  Filled 2020-12-25 (×2): qty 1

## 2020-12-25 MED ORDER — AMLODIPINE BESYLATE 5 MG PO TABS
5.0000 mg | ORAL_TABLET | Freq: Every day | ORAL | Status: DC
  Administered 2020-12-26 – 2020-12-29 (×4): 5 mg via ORAL
  Filled 2020-12-25 (×4): qty 1

## 2020-12-25 MED ORDER — SODIUM CHLORIDE 0.9% FLUSH
3.0000 mL | Freq: Two times a day (BID) | INTRAVENOUS | Status: DC
  Administered 2020-12-25 – 2020-12-28 (×2): 3 mL via INTRAVENOUS

## 2020-12-25 MED ORDER — ONDANSETRON HCL 4 MG/2ML IJ SOLN: 4.0000 mg | Freq: Four times a day (QID) | INTRAMUSCULAR | Status: DC | PRN

## 2020-12-25 MED ORDER — HEPARIN BOLUS VIA INFUSION
1100.0000 [IU] | Freq: Once | INTRAVENOUS | Status: AC
  Administered 2020-12-25: 1100 [IU] via INTRAVENOUS
  Filled 2020-12-25: qty 1100

## 2020-12-25 MED ORDER — PANTOPRAZOLE SODIUM 40 MG PO TBEC
40.0000 mg | DELAYED_RELEASE_TABLET | Freq: Every day | ORAL | Status: DC
  Administered 2020-12-26 – 2020-12-29 (×4): 40 mg via ORAL
  Filled 2020-12-25 (×2): qty 1
  Filled 2020-12-25: qty 2
  Filled 2020-12-25: qty 1

## 2020-12-25 MED ORDER — HYDROCODONE-ACETAMINOPHEN 5-325 MG PO TABS
1.0000 | ORAL_TABLET | Freq: Three times a day (TID) | ORAL | Status: DC | PRN
Start: 2020-12-25 — End: 2020-12-25

## 2020-12-25 MED ORDER — CHROMIUM 1 MG PO CAPS: 1.0000 mg | ORAL_CAPSULE | Freq: Every day | ORAL | Status: DC

## 2020-12-25 MED ORDER — HEPARIN (PORCINE) 25000 UT/250ML-% IV SOLN
1350.0000 [IU]/h | INTRAVENOUS | Status: DC
  Administered 2020-12-25: 1350 [IU]/h via INTRAVENOUS

## 2020-12-25 MED ORDER — LORATADINE 10 MG PO TABS
10.0000 mg | ORAL_TABLET | Freq: Every day | ORAL | Status: DC
  Administered 2020-12-26 – 2020-12-29 (×4): 10 mg via ORAL
  Filled 2020-12-25 (×4): qty 1

## 2020-12-25 MED ORDER — METOPROLOL TARTRATE 12.5 MG HALF TABLET
12.5000 mg | ORAL_TABLET | Freq: Two times a day (BID) | ORAL | Status: DC
  Administered 2020-12-25 – 2020-12-29 (×8): 12.5 mg via ORAL
  Filled 2020-12-25 (×8): qty 1

## 2020-12-25 MED ORDER — SODIUM CHLORIDE 0.9% FLUSH
3.0000 mL | Freq: Two times a day (BID) | INTRAVENOUS | Status: DC
  Administered 2020-12-25 – 2020-12-28 (×5): 3 mL via INTRAVENOUS

## 2020-12-25 MED ORDER — GLUCOSAMINE CHONDR 1500 COMPLX PO CAPS: ORAL_CAPSULE | Freq: Every day | ORAL | Status: DC

## 2020-12-25 MED ORDER — SODIUM CHLORIDE 0.9% FLUSH
3.0000 mL | INTRAVENOUS | Status: DC | PRN
  Administered 2020-12-26: 3 mL via INTRAVENOUS

## 2020-12-25 MED ORDER — METOPROLOL TARTRATE 25 MG PO TABS
12.5000 mg | ORAL_TABLET | Freq: Two times a day (BID) | ORAL | Status: DC
  Administered 2020-12-25: 12.5 mg via ORAL
  Filled 2020-12-25: qty 1

## 2020-12-25 MED ORDER — NITROGLYCERIN 0.4 MG SL SUBL: 0.4000 mg | SUBLINGUAL_TABLET | SUBLINGUAL | Status: DC | PRN

## 2020-12-25 MED ORDER — LEVOTHYROXINE SODIUM 50 MCG PO TABS
50.0000 ug | ORAL_TABLET | Freq: Every day | ORAL | Status: DC
  Administered 2020-12-26 – 2020-12-29 (×4): 50 ug via ORAL
  Filled 2020-12-25 (×4): qty 1

## 2020-12-25 MED ORDER — MELATONIN 5 MG PO TABS
5.0000 mg | ORAL_TABLET | Freq: Every day | ORAL | Status: DC
  Administered 2020-12-25 – 2020-12-28 (×2): 5 mg via ORAL
  Filled 2020-12-25 (×3): qty 1

## 2020-12-25 MED ORDER — SODIUM CHLORIDE 0.9 % IV SOLN: 250.0000 mL | INTRAVENOUS | Status: DC | PRN

## 2020-12-25 MED ORDER — METOPROLOL TARTRATE 25 MG PO TABS
12.5000 mg | ORAL_TABLET | Freq: Two times a day (BID) | ORAL | 0 refills | Status: DC
Start: 2020-12-25 — End: 2022-03-28

## 2020-12-25 MED ORDER — ADULT MULTIVITAMIN W/MINERALS CH
1.0000 | ORAL_TABLET | Freq: Every day | ORAL | Status: DC
  Administered 2020-12-26 – 2020-12-29 (×4): 1 via ORAL
  Filled 2020-12-25 (×5): qty 1

## 2020-12-25 MED ORDER — ASPIRIN EC 81 MG PO TBEC
81.0000 mg | DELAYED_RELEASE_TABLET | Freq: Every day | ORAL | Status: DC
  Administered 2020-12-26 – 2020-12-29 (×3): 81 mg via ORAL
  Filled 2020-12-25 (×3): qty 1

## 2020-12-25 MED ORDER — ATORVASTATIN CALCIUM 80 MG PO TABS
80.0000 mg | ORAL_TABLET | Freq: Every day | ORAL | Status: DC
  Administered 2020-12-26 – 2020-12-29 (×4): 80 mg via ORAL
  Filled 2020-12-25: qty 2
  Filled 2020-12-25 (×3): qty 1

## 2020-12-25 MED ORDER — HEPARIN (PORCINE) 25000 UT/250ML-% IV SOLN
1450.0000 [IU]/h | INTRAVENOUS | Status: DC
  Administered 2020-12-25 – 2020-12-27 (×2): 1450 [IU]/h via INTRAVENOUS
  Filled 2020-12-25 (×4): qty 250

## 2020-12-25 MED ORDER — ATORVASTATIN CALCIUM 20 MG PO TABS: 40.0000 mg | ORAL_TABLET | Freq: Every day | ORAL | 0 refills | Status: DC

## 2020-12-25 MED ORDER — HEPARIN (PORCINE) 25000 UT/250ML-% IV SOLN
1350.0000 [IU]/h | INTRAVENOUS | 0 refills | Status: DC
Start: 2020-12-25 — End: 2020-12-29

## 2020-12-25 NOTE — Consult Note (Signed)
[place] for Heparin Indication: chest pain/ACS  Patient Measurements: Heparin Dosing Weight: 76 kg  Labs: Recent Labs    12/23/20 1200 12/23/20 1322 12/23/20 1340 12/23/20 1716 12/23/20 2140 12/24/20 0526 12/24/20 0711 12/24/20 1036 12/24/20 2009 12/25/20 0533  HGB 15.1  --   --   --   --  15.1  --   --   --  15.2  HCT 48.3  --   --   --   --  48.0  --   --   --  49.5  PLT 418*  --   --   --   --  382  --   --   --  435*  APTT  --   --   --  117*  --   --   --   --   --   --   LABPROT  --  19.3*  --   --   --   --   --   --   --   --   INR  --  1.7*  --   --   --   --   --   --   --   --   HEPARINUNFRC  --   --   --   --    < > 0.36  --   --  <0.10* 0.26*  CREATININE 1.38*  --   --   --   --  1.28*  --   --   --   --   TROPONINIHS 192*  --  637*  --   --   --  668* 543*  --   --    < > = values in this interval not displayed.    Estimated Creatinine Clearance: 50.6 mL/min (A) (by C-G formula based on SCr of 1.28 mg/dL (H)).   Medical History: Past Medical History:  Diagnosis Date  . Arthritis   . Basal cell carcinoma    L clavicle, L prox forearm- removed years ago   . Bladder cancer ([place])   . Bladder neck contracture   . CAD (coronary artery disease)    a. 12/2020 NSTEMI/Cath: LM nl, LAD 95ost/p, LCX 50p, RCA nl. EF 45-50%.  . Cataract   . CKD (chronic kidney disease), stage III ([place])   . ED (erectile dysfunction)   . Frequency   . GERD (gastroesophageal reflux disease)   . 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 Following LE DVT-->Chronic warfarin.  Marland Kitchen Hyperlipidemia LDL goal <70   . Hypertension   . Hypothyroidism   . Incontinence of urine    sui, s/p cryoablation  . Ischemic cardiomyopathy    a. 11/2018 Echo: EF 60-65%; b. 12/2020 LV Gram: EF 45-50% in setting of NSTEMI.  Marland Kitchen Kidney stones   . Neuropathy    feet  . Nocturia   . Prostate cancer ([place])    S/P   CRYOABLATION  . Right elbow tendinitis   . Right  Lower Extremity DVT (deep venous thrombosis) ([place]) 11/25/2018  . Vertigo    1-2x/yr  . Wears glasses    Heparin Dosing Weight: 76 kg  Assessment: 75 yo male with h/o bladder cancer (s/p cryoablation TURP), HTN, NIDDM, PE (provoked postop) who presents with substernal chest discomfort. Pharmacy consulted to start heparin for ACS/NSTEMI. Trops elevated 192>637. On chronic warfarin for DVT/PE; INR on admit was 1.7.  Heparin adjusted this morning, repeat level still just below  goal, now transferred to [facility]. No IV or bleeding issues noted. CBC stable.   Goal of Therapy:  Heparin level 0.3-0.7 units/ml Monitor platelets by anticoagulation protocol: Yes   Plan:  Increase heparin to 1450 units/hr Recheck heparin level in am Plan for cath/PCI on Monday 4/11  Erin Hearing PharmD., BCPS Clinical Pharmacist 12/25/2020 3:08 PM

## 2020-12-25 NOTE — H&P (Addendum)
History & Physical    Patient ID: Phillip [name] MRN: [id number], DOB/AGE: 02/25/1946   Admit date: 12/25/2020   Primary Physician: Rusty Aus, MD Primary Cardiologist: Yolonda Kida, MD  Patient Profile    75 y/o ? w/ a h/o bladder cancer, RLE DVT and PE (11/2018) on chronic warfarin, GERD, HTN, HL, CKD III, and nephrolithiasis, who is being transferred to [facility] 12/25/2020 following presentation to [facility] on 4/6 w/ NSTEMI and subsequent finding of severe ostial/prox LAD dzs.  Past Medical History    Past Medical History:  Diagnosis Date  . Arthritis   . Basal cell carcinoma    L clavicle, L prox forearm- removed years ago   . Bladder cancer ([place])   . Bladder neck contracture   . CAD (coronary artery disease)    a. 12/2020 NSTEMI/Cath: LM nl, LAD 95ost/p, LCX 50p, RCA nl. EF 45-50%.  . Cataract   . CKD (chronic kidney disease), stage III ([place])   . ED (erectile dysfunction)   . Frequency   . GERD (gastroesophageal reflux disease)   . 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 Following LE DVT-->Chronic warfarin.  Marland Kitchen Hyperlipidemia LDL goal <70   . Hypertension   . Hypothyroidism   . Incontinence of urine    sui, s/p cryoablation  . Ischemic cardiomyopathy    a. 11/2018 Echo: EF 60-65%; b. 12/2020 LV Gram: EF 45-50% in setting of NSTEMI.  Marland Kitchen Kidney stones   . Neuropathy    feet  . Nocturia   . Prostate cancer ([place])    S/P   CRYOABLATION  . Right elbow tendinitis   . Right Lower Extremity DVT (deep venous thrombosis) ([place]) 11/25/2018  . Vertigo    1-2x/yr  . Wears glasses     Past Surgical History:  Procedure Laterality Date  . ARTHROPLASTY AND TENDON REPAIR LEFT THUMB  JAN 2014  . CATARACT EXTRACTION W/PHACO Left 11/16/2020   Procedure: CATARACT EXTRACTION PHACO AND INTRAOCULAR LENS PLACEMENT (IOC) LEFT  7.09 00:56.4 12.6%;  Surgeon: Leandrew Koyanagi, MD;  Location: [facility];  Service: Ophthalmology;  Laterality: Left;  . CATARACT  EXTRACTION W/PHACO Right 12/02/2020   Procedure: CATARACT EXTRACTION PHACO AND INTRAOCULAR LENS PLACEMENT (IOC) RIGHT MALYUGIN 5.52 00:49.7 11.1%;  Surgeon: Leandrew Koyanagi, MD;  Location: [place];  Service: Ophthalmology;  Laterality: Right;  . COLONOSCOPY    . COLONOSCOPY WITH PROPOFOL N/A 08/09/2017   Procedure: COLONOSCOPY WITH PROPOFOL;  Surgeon: Manya Silvas, MD;  Location: [facility] ENDOSCOPY;  Service: Endoscopy;  Laterality: N/A;  . ESOPHAGOGASTRODUODENOSCOPY (EGD) WITH PROPOFOL N/A 08/09/2017   Procedure: ESOPHAGOGASTRODUODENOSCOPY (EGD) WITH PROPOFOL;  Surgeon: Manya Silvas, MD;  Location: [facility] ENDOSCOPY;  Service: Endoscopy;  Laterality: N/A;  . GREEN LIGHT LASER TURP (TRANSURETHRAL RESECTION OF PROSTATE N/A 12/14/2015   Procedure: GREEN LIGHT LASER TURP (TRANSURETHRAL RESECTION OF PROSTATE) LASER OF BLADDER NECK CONTRACTURE;  Surgeon: Carolan Clines, MD;  Location: [place];  Service: Urology;  Laterality: N/A;  . LEFT HEART CATH AND CORONARY ANGIOGRAPHY N/A 12/24/2020   Procedure: LEFT HEART CATH AND CORONARY ANGIOGRAPHY and possible PCI and stent;  Surgeon: Yolonda Kida, MD;  Location: [facility];  Service: Cardiovascular;  Laterality: N/A;  . PENILE PROSTHESIS IMPLANT N/A 12/06/2013   Procedure: PENILE PROTHESIS INFLATABLE;  Surgeon: Ailene Rud, MD;  Location: [facility];  Service: Urology;  Laterality: N/A;  . PROSTATE CRYOABLATION  06-01-2012  [facility]  .  TRANSURETHRAL RESECTION OF BLADDER NECK N/A 03/20/2015   Procedure: RELEASE BLADDER NECK CONTRACTURE  WITH WOLF BUTTON ELECTRODE ;  Surgeon: Carolan Clines, MD;  Location: [place];  Service: Urology;  Laterality: N/A;  . TRANSURETHRAL RESECTION OF BLADDER TUMOR N/A 12/05/2018   Procedure: TRANSURETHRAL RESECTION OF BLADDER TUMOR (TURBT)/CYSTOSCOPY/  INSTILLATION OF Rodell Perna;  Surgeon: Ceasar Mons, MD;  Location:  [facility];  Service: Urology;  Laterality: N/A;  . TRANSURETHRAL RESECTION OF BLADDER TUMOR N/A 01/15/2020   Procedure: TRANSURETHRAL RESECTION OF BLADDER TUMOR (TURBT)/ CYSTOSCOPY;  Surgeon: Ceasar Mons, MD;  Location: [facility];  Service: Urology;  Laterality: N/A;  . TRIGGER FINGER RELEASE Left 10/2015  . ulner nerve neuropathy       Allergies  Allergies  Allergen Reactions  . Doxazosin Other (See Comments)    Other reaction(s): Unknown    History of Present Illness    75 y/o ? w/ a h/o bladder cancer, RLE DVT and PE (11/2018) on chronic warfarin, GERD, HTN, HL, CKD III, and nephrolithiasis.  He was dx w/ bladder cancer in early 2020 and in March 2020 was dx w/ RLE DVT and placed on lovenox.  He came off of lovenox x 48hrs in preparation for TURBT, and post-operatively developed chest pain and was later found to have nonocclusive segmental and subsegmental pulmonary emboli within the right upper lobe, right middle lobe and right lower lobe pulmonary branches.  He was seen by heme-onc @ the time and placed on lovenox and subsequently eliquis.  He has since been on warfarin.  Mr. Steinborn is fairly active, playing golf and going to the gym regularly.  He was in his USOH until 4/6, when while digging in his yard, he developed 4/10 retrosternal chest tightness w/o assoc Ss.  He sat and discomfort improved to 1-2/10, but then ran an errand for work and noted recurrent worsening of chest tightness, prompting him to drive himself to the [facility] ED.  Here, ECG showed RSR, 79, PAC, first deg AVB w/o acute ST/T changes.  Chest pain resolved while in the ED after ~ 3 hrs in total.  Initial HsTrop was elevated @ 192 but subsequently rose to 637  668  543.  He was placed on heparin and seen by cardiology.  He underwent diagnostic cath on 4/7, revealing severe ostial/proximal LAD disease.  Films were reviewed this AM, and decision was made to tx to [facility] for further  eval and probable PCI of the LAD on Monday 4/11.  Pt has had no recurrent c/p since admisison.  Home Medications    [facility] Meds:   .  acetaminophen (TYLENOL) tablet 650 mg, 650 mg, Oral, Q6H PRN **OR** acetaminophen (TYLENOL) suppository 650 mg, 650 mg, Rectal, Q6H PRN, Cox, Amy N, DO .  acetaminophen (TYLENOL) tablet 650 mg, 650 mg, Oral, Q4H PRN, Callwood, Dwayne D, MD, 650 mg at 12/24/20 1759 .  amLODipine (NORVASC) tablet 5 mg, 5 mg, Oral, Daily, Cox, Amy N, DO, 5 mg at 12/25/20 0918 .  atorvastatin (LIPITOR) tablet 40 mg, 40 mg, Oral, Daily, Fritzi Mandes, MD, 40 mg at 12/25/20 0918 .  cholecalciferol (VITAMIN D3) tablet 1,000 Units, 1,000 Units, Oral, Daily, Fritzi Mandes, MD, 1,000 Units at 12/25/20 [id number] .  [COMPLETED] heparin bolus via infusion 1,100 Units, 1,100 Units, Intravenous, Once, 1,100 Units at 12/25/20 0731 **FOLLOWED BY** heparin ADULT infusion 100 units/mL (25000 units/248mL), 1,350 Units/hr, Intravenous, Continuous, Belue, Alver Sorrow, RPH, Last Rate: 13.5 mL/hr at  12/25/20 0733, 1,350 Units/hr at 12/25/20 0733 .  HYDROcodone-acetaminophen (NORCO/VICODIN) 5-325 MG per tablet 1 tablet, 1 tablet, Oral, Q8H PRN, Fritzi Mandes, MD .  levothyroxine (SYNTHROID) tablet 50 mcg, 50 mcg, Oral, Q0600, Cox, Amy N, DO, 50 mcg at 12/25/20 0510 .  loratadine (CLARITIN) tablet 10 mg, 10 mg, Oral, Daily, Fritzi Mandes, MD, 10 mg at 12/25/20 3016 .  metoprolol tartrate (LOPRESSOR) tablet 12.5 mg, 12.5 mg, Oral, BID, Callwood, Dwayne D, MD, 12.5 mg at 12/25/20 0109 .  multivitamin with minerals tablet 1 tablet, 1 tablet, Oral, Daily, Cox, Amy N, DO, 1 tablet at 12/25/20 0918 .  niacin (SLO-NIACIN) CR tablet 500 mg, 500 mg, Oral, QHS, Fritzi Mandes, MD, 500 mg at 12/24/20 2142 .  ondansetron (ZOFRAN) tablet 4 mg, 4 mg, Oral, Q6H PRN **OR** ondansetron (ZOFRAN) injection 4 mg, 4 mg, Intravenous, Q6H PRN, Cox, Amy N, DO .  ondansetron (ZOFRAN) injection 4 mg, 4 mg, Intravenous, Q6H PRN, Callwood, Dwayne D, MD .   pantoprazole (PROTONIX) EC tablet 40 mg, 40 mg, Oral, Daily, Cox, Amy N, DO, 40 mg at 12/25/20 0918 .  sodium chloride flush (NS) 0.9 % injection 3 mL, 3 mL, Intravenous, Q12H, Callwood, Dwayne D, MD, 3 mL at 12/24/20 2143 .  sodium chloride flush (NS) 0.9 % injection 3 mL, 3 mL, Intravenous, Q12H, Callwood, Dwayne D, MD, 3 mL at 12/25/20 0353 .  sodium chloride flush (NS) 0.9 % injection 3 mL, 3 mL, Intravenous, PRN, Callwood, Dwayne D, MD  Family History    Family History  Problem Relation Age of Onset  . CAD Father 8   He indicated that his mother is deceased. He indicated that his father is deceased.   Social History    Social History   Socioeconomic History  . Marital status: Married    Spouse name: Not on file  . Number of children: Not on file  . Years of education: Not on file  . Highest education level: Not on file  Occupational History  . Not on file  Tobacco Use  . Smoking status: Never Smoker  . Smokeless tobacco: Never Used  Vaping Use  . Vaping Use: Never used  Substance and Sexual Activity  . Alcohol use: No  . Drug use: No  . Sexual activity: Not Currently  Other Topics Concern  . Not on file  Social History Narrative   Lives locally with wife.  Exercises regularly - gym/golf.  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   Social Determinants of Health   Financial Resource Strain: Not on file  Food Insecurity: Not on file  Transportation Needs: Not on file  Physical Activity: Not on file  Stress: Not on file  Social Connections: Not on file  Intimate Partner Violence: Not on file     Review of Systems    General:  No chills, fever, night sweats or weight changes.  Cardiovascular:  +++ chest pain/tightness prior to admission, no dyspnea on exertion, edema, orthopnea, palpitations, paroxysmal nocturnal dyspnea. Dermatological: No rash, lesions/masses Respiratory: No cough, dyspnea Urologic: No hematuria,  dysuria Abdominal:   No nausea, vomiting, diarrhea, bright red blood per rectum, melena, or hematemesis Neurologic:  No visual changes, wkns, changes in mental status. All other systems reviewed and are otherwise negative except as noted above.  Physical Exam    98.3, 71, 13, 150/84, 97% on RA  General: Pleasant, NAD Psych: Normal affect. Neuro: Alert and oriented X 3. Moves all  extremities spontaneously. HEENT: Normal  Neck: Supple without bruits or JVD. Lungs:  Resp regular and unlabored, CTA. Heart: RRR no s3, s4, or murmurs. R radial cath site w/o bleeding/bruit/hematoma. Abdomen: Soft, non-tender, non-distended, BS + x 4.  Extremities: No clubbing, cyanosis or edema. DP/PT 2+, Radials 2+ and equal bilaterally.  Labs    Cardiac Enzymes Recent Labs  Lab 12/23/20 1200 12/23/20 1340 12/24/20 0711 12/24/20 1036  TROPONINIHS 192* 637* 668* 543*      Lab Results  Component Value Date   WBC 14.4 (H) 12/25/2020   HGB 15.2 12/25/2020   HCT 49.5 12/25/2020   MCV 87.6 12/25/2020   PLT 435 (H) 12/25/2020    Recent Labs  Lab 12/24/20 0526  NA 139  K 4.0  CL 108  CO2 25  BUN 18  CREATININE 1.28*  CALCIUM 8.2*  GLUCOSE 96   Lab Results  Component Value Date   CHOL 174 12/24/2020   HDL 32 (L) 12/24/2020   LDLCALC 119 (H) 12/24/2020   TRIG 113 12/24/2020     Radiology Studies    DG Chest 2 View  Result Date: 12/23/2020 CLINICAL DATA:  Chest pain EXAM: CHEST - 2 VIEW COMPARISON:  None. FINDINGS: The heart size and mediastinal contours are within normal limits. Both lungs are clear. The visualized skeletal structures are unremarkable. IMPRESSION: No active cardiopulmonary disease. Electronically Signed   By: Franchot Gallo M.D.   On: 12/23/2020 12:54   CT Angio Chest PE W/Cm &/Or Wo Cm  Result Date: 12/23/2020 CLINICAL DATA:  Suspected pulmonary embolism, high probability in a 75 year old male. EXAM: CT ANGIOGRAPHY CHEST WITH CONTRAST TECHNIQUE: Multidetector CT  imaging of the chest was performed using the standard protocol during bolus administration of intravenous contrast. Multiplanar CT image reconstructions and MIPs were obtained to evaluate the vascular anatomy. CONTRAST:  4mL OMNIPAQUE IOHEXOL 350 MG/ML SOLN COMPARISON:  December 07, 2018 FINDINGS: Cardiovascular: Mild cardiac enlargement. No pericardial effusion. Three-vessel coronary artery calcification. Mild-to-moderate generalized aortic atherosclerosis without aneurysm. Normal caliber central pulmonary vessels. Negative for acute pulmonary embolism. Mediastinum/Nodes: Thoracic inlet structures are normal. No axillary lymphadenopathy. No mediastinal lymphadenopathy. No hilar lymphadenopathy. Esophagus grossly normal. Lungs/Pleura: Basilar atelectasis. Mild air trapping. No consolidation. No pleural effusion. Upper Abdomen: Low-density splenic lesions are similar to the study ofm July of 2020. 1.3 cm lesion with low-density in the spleen at the periphery measuring water density on image 80 of series 4 at 13 mm as compared to 12 mm on the previous study. Smaller lesion seen inferior to this may have enlarged slightly but also measures water density. Imaged portions of the liver, gallbladder, pancreas and adrenal glands are normal. Musculoskeletal: No acute bone finding. No destructive bone process. Spinal degenerative changes. Review of the MIP images confirms the above findings. IMPRESSION: 1. Negative for acute pulmonary embolism. 2. Atelectasis and mild air trapping, could reflect small airways disease. 3. Mild-to-moderate generalized aortic atherosclerosis without aneurysm. Three-vessel coronary artery calcification. 4. Low-density splenic lesions are similar to the study of July of 2020. One of these may have enlarged slightly but also measures water density. Findings are likely small cysts. 5. Aortic atherosclerosis. Aortic Atherosclerosis (ICD10-I70.0). Electronically Signed   By: Zetta Bills M.D.   On:  12/23/2020 15:06   CARDIAC CATHETERIZATION  Result Date: 12/25/2020  Ost LAD to Prox LAD lesion is 95% stenosed. Calcification moderate  Prox Cx lesion is 50% stenosed.  Unable to successfully cannulate and engaged right coronary artery which we think is nondominant  Mildly reduced borderline left ventricular function with anterior apical hypokinesis ejection fraction around 45 to 50%  Conclusion Inpatient diagnostic cardiac cath right radial approach.  LV function borderline to mildly depressed EF around 45 to 50% with anterior apical hypokinesis Coronaries Left main large relatively free of disease LAD was large with a complex calcified proximal lesion 95% Circumflex moderate disease but large dominant system Right coronary artery was not successfully cannulated but appears to be nondominant and selective shots Complications Patient is being transferred to [facility] for possible complex intervention of proximal LAD versus CABG Patient's been transferred to [facility]    ECG & Cardiac Imaging    RSR, 79, PAC, 1st deg AVB - personally reviewed.  Assessment & Plan    1.  NSTEMI/CAD:  Presented 4/6 w/ 3 hrs of exertional chest tightness and r/i for NSTEMI w/ HsTrop of 192  637  668  543.  Cath on 4/7 revealed severe ostial/proximal LAD dsz and otw nonobs CAD.  Films reviewed by interventional team here and @ [facility] w/ plan for PCI on 4/11, so pt transferred to [facility] today.  Cont asa, heparin,  blocker, and high potency statin rx.  2.  Essential HTN:  Stable on  blocker and amlodipine.  With mild LV dysfxn, will need to consider ARB/ACEI, though will hold off for now given CKD III and significant contrast load thus far (385 mls in past 2 days in setting of CTA chest and cath).  3.  HL:  LDL 119.  Lipitor added this admission.  4.  Ischemic cardiomyopathy:  EF 45-50% by V gram.  Euvolemic on exam.  Cont  blocker.  Avoiding acei/arb for now given contrast load and CKD III.  F/u echo.  5.  CKD  III:  Creat 1.2 last year, 1.38 on admission.  S/p 385 ml of contrast this admission. Creat 1.28 today. Follow.  Hydrate pre-cath Monday.  6.  Systolic murmur:  No significant valvular dzs on echo in 2020.  F/u in setting of NSTEMI.  7.  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  Heparin bridging over the weekend.  Has received lovenox before, so could probably transition back to lovenox and bridge as an outpt post-pci.  8.  Hypothyroidism:  Cont synthroid.  Signed, Murray Hodgkins, NP 12/25/2020, 10:52 AM

## 2020-12-25 NOTE — Progress Notes (Signed)
  Echocardiogram 2D Echocardiogram has been performed.  Phillip [name] F 12/25/2020, 3:47 PM

## 2020-12-25 NOTE — Discharge Summary (Signed)
Phillip [name] NAME: Phillip [name]    MR#:  [id number]  DATE OF BIRTH:  1946/04/21  DATE OF ADMISSION:  12/23/2020 ADMITTING PHYSICIAN: Amy N Cox, DO  DATE OF DISCHARGE: 12/24/2020 to [facility]  PRIMARY CARE PHYSICIAN: Rusty Aus, MD    ADMISSION DIAGNOSIS:  NSTEMI (non-ST elevated myocardial infarction) ([place]) [I21.4]  DISCHARGE DIAGNOSIS:  NSTEMI/3 vessel CAD--transferring to [facility] for further Cardiac w/u  SECONDARY DIAGNOSIS:   Past Medical History:  Diagnosis Date  . Arthritis   . Basal cell carcinoma    L clavicle, L prox forearm- removed years ago   . Bladder cancer ([place])   . Bladder neck contracture   . Cataract   . DVT (deep venous thrombosis) ([place]) 11/25/2018   right le  . Dyslipidemia   . ED (erectile dysfunction)   . Frequency   . GERD (gastroesophageal reflux disease)   . History of pulmonary embolus (PE)   . Hypertension   . Hypothyroidism   . Incontinence of urine    sui, s/p cryoablation  . Kidney stones   . Neuropathy    feet  . Nocturia   . Prostate cancer ([place])    S/P   CRYOABLATION  . Right elbow tendinitis   . Vertigo    1-2x/yr  . Wears glasses     HOSPITAL COURSE:   Phillip [name] a 76 y.o.malewith medical history significant forhistory of bladder cancer status post cryoablation and TURP, hypertension, hyperlipidemia, non-insulin-dependent diabetes mellitus, history of postoperative pulmonary embolism, hypogonadism taking testosterone IM injection, presents emergency department for chief concerns of chest pressure.   NSTEMI Severe 3 vessel CAD on cardiac cath--further management at [facility] -- patient came in with chest pressure radiating to his arm. No previous history of coronary artery disease. Positive family history of MI --CTA evidence of three-vessel disease, no PE -High-sensitivity troponin was 192 and increased to 637 -Heparin GTT to be continued upon  transfer -Cardiology, Dr. Clayborn Bigness has been consulted--  Cardiac cath showed--Inpatient diagnostic cardiac cath right radial approach.   LV function borderline to mildly depressed EF around 45 to 50% with anterior apical hypokinesis -Left main large relatively free of disease LAD was large with a complex calcified proximal lesion 95% -Circumflex moderate disease but large dominant system -Right coronary artery was not successfully cannulated but appears to be nondominant and selective shots Patient is being transferred to [facility] for possible complex intervention of proximal LAD versus CABG Patient's been transferred to [facility] per Cardiology recommendation and pt request for [facility] to [facility]. --pt has been accepted by Dr Jenetta Downer Neal--[facility] cardiology -- continue aspirin, statins, BB --HOlding Benazepril for now given need for another cath on Monday at [facility]  Hypogonadism-he receives testosterone IMevery 14 days -His last administration was 12/18/2020  Hypertension-amlodipine 5 mg daily, benazepril 20 mg daily resumed  CKD 3 a-at baseline -Serum creatinine/eGFR on admission was 1.38/54 --creat 1.28  GERD-resumed PPI  History of pulmonary embolism-warfarin on hold for now since patient currently receiving Heparin GTT  Hypothyroid-resumed home levothyroxine   [facility]   DVT prophylaxis:Heparin GTT Code Status:partial code Family Communication wife at bedside Disposition [place] called:Cardiology--[facility] Admission status:Progressive cardiac     Dispo: The patient is from: Home  Anticipated d/c is to: [facility] health today  Patient currently is not medically stable to d/c.              Difficult to place  patient No  d/w wife at bedside  CONSULTS OBTAINED:    DRUG ALLERGIES:   Allergies  Allergen Reactions  . Doxazosin Other (See Comments)    Other reaction(s): Unknown    DISCHARGE MEDICATIONS:    Allergies as of 12/25/2020      Reactions   Doxazosin Other (See Comments)   Other reaction(s): Unknown      Medication List    STOP taking these medications   benazepril 20 MG tablet Commonly known as: LOTENSIN   diclofenac 75 MG EC tablet Commonly known as: VOLTAREN   venlafaxine XR 37.5 MG 24 hr capsule Commonly known as: EFFEXOR-XR   warfarin 4 MG tablet Commonly known as: COUMADIN     TAKE these medications   acetaminophen 500 MG tablet Commonly known as: TYLENOL Take 500 mg by mouth every 6 (six) hours as needed.   amLODipine 5 MG tablet Commonly known as: NORVASC Take 5 mg by mouth daily.   atorvastatin 20 MG tablet Commonly known as: LIPITOR Take 2 tablets (40 mg total) by mouth daily.   cetirizine 10 MG tablet Commonly known as: ZYRTEC Take 10 mg by mouth 2 (two) times daily.   cholecalciferol 25 MCG (1000 UNIT) tablet Commonly known as: VITAMIN D3 Take 1,000 Units by mouth daily.   Chromium 1 MG Caps Take 1 mg by mouth daily. 1 per day   GLUCOSAMINE CHONDR 1500 COMPLX PO Take 1 tablet by mouth daily.   heparin 25000 UT/250ML infusion Inject 1,350 Units/hr into the vein continuous.   levothyroxine 50 MCG tablet Commonly known as: SYNTHROID Take 50 mcg by mouth.   metoprolol tartrate 25 MG tablet Commonly known as: LOPRESSOR Take 0.5 tablets (12.5 mg total) by mouth 2 (two) times daily.   multivitamin tablet Take 1 tablet by mouth daily.   niacin 500 MG tablet Take 500 mg by mouth at bedtime.   omeprazole 40 MG capsule Commonly known as: PRILOSEC Take 40 mg by mouth daily.   potassium phosphate (monobasic) 500 MG tablet Commonly known as: K-PHOS ORIGINAL Take 500 mg by mouth daily.   testosterone cypionate 200 MG/ML injection Commonly known as: DEPOTESTOSTERONE CYPIONATE Inject 200 mg into the muscle every 14 (fourteen) days.   TURMERIC PO Take 1 Dose by mouth daily.       If you experience worsening of your admission  symptoms, develop shortness of breath, life threatening emergency, suicidal or homicidal thoughts you must seek medical attention immediately by calling 911 or calling your MD immediately  if symptoms less severe.  You Must read complete instructions/literature along with all the possible adverse reactions/side effects for all the Medicines you take and that have been prescribed to you. Take any new Medicines after you have completely understood and accept all the possible adverse reactions/side effects.   Please note  You were cared for by a hospitalist during your hospital stay. If you have any questions about your discharge medications or the care you received while you were in the hospital after you are discharged, you can call the unit and asked to speak with the hospitalist on call if the hospitalist that took care of you is not available. Once you are discharged, your primary care physician will handle any further medical issues. Please note that NO REFILLS for any discharge medications will be authorized once you are discharged, as it is imperative that you return to your primary care physician (or establish a relationship with a primary care physician if you do not have  one) for your aftercare needs so that they can reassess your need for medications and monitor your lab values. Today   SUBJECTIVE   A bit anxious about the transfer---'I wasn't expecting this! Wife at bedside No cp or sob  VITAL SIGNS:  Blood pressure (!) 144/81, pulse 66, temperature 98.4 F (36.9 C), temperature source Oral, resp. rate 16, height 5' 9"  (1.753 m), weight 75.3 kg, SpO2 97 %.  I/O:    Intake/Output Summary (Last 24 hours) at 12/25/2020 0903 Last data filed at 12/24/2020 1559 Gross per 24 hour  Intake 869.23 ml  Output --  Net 869.23 ml    PHYSICAL EXAMINATION:  GENERAL:  75 y.o.-year-old patient lying in the bed with no acute distress. LUNGS: Normal breath sounds bilaterally, no wheezing,  rales,rhonchi or crepitation. No use of accessory muscles of respiration.  CARDIOVASCULAR: S1, S2 normal. No murmurs, rubs, or gallops.  ABDOMEN: Soft, non-tender, non-distended. Bowel sounds present. No organomegaly or mass.  EXTREMITIES: No pedal edema, cyanosis, or clubbing.  NEUROLOGIC: Cranial nerves II through XII are intact. Muscle strength 5/5 in all extremities. Sensation intact. Gait not checked.  PSYCHIATRIC: The patient is alert and oriented x 3.  SKIN: No obvious rash, lesion, or ulcer.   DATA REVIEW:   CBC  Recent Labs  Lab 12/25/20 0533  WBC 14.4*  HGB 15.2  HCT 49.5  PLT 435*    Chemistries  Recent Labs  Lab 12/24/20 0526  NA 139  K 4.0  CL 108  CO2 25  GLUCOSE 96  BUN 18  CREATININE 1.28*  CALCIUM 8.2*    Microbiology Results   Recent Results (from the past 240 hour(s))  Resp Panel by RT-PCR (Flu A&B, Covid) Nasopharyngeal Swab     Status: None   Collection Time: 12/23/20  1:22 PM   Specimen: Nasopharyngeal Swab; Nasopharyngeal(NP) swabs in vial transport medium  Result Value Ref Range Status   SARS Coronavirus 2 by RT PCR NEGATIVE NEGATIVE Final    Comment: (NOTE) SARS-CoV-2 target nucleic acids are NOT DETECTED.  The SARS-CoV-2 RNA is generally detectable in upper respiratory specimens during the acute phase of infection. The lowest concentration of SARS-CoV-2 viral copies this assay can detect is 138 copies/mL. A negative result does not preclude SARS-Cov-2 infection and should not be used as the sole basis for treatment or other patient management decisions. A negative result may occur with  improper specimen collection/handling, submission of specimen other than nasopharyngeal swab, presence of viral mutation(s) within the areas targeted by this assay, and inadequate number of viral copies(<138 copies/mL). A negative result must be combined with clinical observations, patient history, and epidemiological information. The expected result is  Negative.  Fact Sheet for Patients:  EntrepreneurPulse.com.au  Fact Sheet for Healthcare Providers:  IncredibleEmployment.be  This test is no t yet approved or cleared by the [place] FDA and  has been authorized for detection and/or diagnosis of SARS-CoV-2 by FDA under an Emergency Use Authorization (EUA). This EUA will remain  in effect (meaning this test can be used) for the duration of the COVID-19 declaration under Section 564(b)(1) of the Act, 21 U.S.C.section 360bbb-3(b)(1), unless the authorization is terminated  or revoked sooner.       Influenza A by PCR NEGATIVE NEGATIVE Final   Influenza B by PCR NEGATIVE NEGATIVE Final    Comment: (NOTE) The Xpert Xpress SARS-CoV-2/FLU/RSV plus assay is intended as an aid in the diagnosis of influenza from Nasopharyngeal swab specimens and should not be used  as a sole basis for treatment. Nasal washings and aspirates are unacceptable for Xpert Xpress SARS-CoV-2/FLU/RSV testing.  Fact Sheet for Patients: EntrepreneurPulse.com.au  Fact Sheet for Healthcare Providers: IncredibleEmployment.be  This test is not yet approved or cleared by the [place] FDA and has been authorized for detection and/or diagnosis of SARS-CoV-2 by FDA under an Emergency Use Authorization (EUA). This EUA will remain in effect (meaning this test can be used) for the duration of the COVID-19 declaration under Section 564(b)(1) of the Act, 21 U.S.C. section 360bbb-3(b)(1), unless the authorization is terminated or revoked.  Performed at [facility], [address] [place], [place] [address]     RADIOLOGY:  DG Chest 2 View  Result Date: 12/23/2020 CLINICAL DATA:  Chest pain EXAM: CHEST - 2 VIEW COMPARISON:  None. FINDINGS: The heart size and mediastinal contours are within normal limits. Both lungs are clear. The visualized skeletal structures are unremarkable.  IMPRESSION: No active cardiopulmonary disease. Electronically Signed   By: Franchot Gallo M.D.   On: 12/23/2020 12:54   CT Angio Chest PE W/Cm &/Or Wo Cm  Result Date: 12/23/2020 CLINICAL DATA:  Suspected pulmonary embolism, high probability in a 75 year old male. EXAM: CT ANGIOGRAPHY CHEST WITH CONTRAST TECHNIQUE: Multidetector CT imaging of the chest was performed using the standard protocol during bolus administration of intravenous contrast. Multiplanar CT image reconstructions and MIPs were obtained to evaluate the vascular anatomy. CONTRAST:  65m OMNIPAQUE IOHEXOL 350 MG/ML SOLN COMPARISON:  December 07, 2018 FINDINGS: Cardiovascular: Mild cardiac enlargement. No pericardial effusion. Three-vessel coronary artery calcification. Mild-to-moderate generalized aortic atherosclerosis without aneurysm. Normal caliber central pulmonary vessels. Negative for acute pulmonary embolism. Mediastinum/Nodes: Thoracic inlet structures are normal. No axillary lymphadenopathy. No mediastinal lymphadenopathy. No hilar lymphadenopathy. Esophagus grossly normal. Lungs/Pleura: Basilar atelectasis. Mild air trapping. No consolidation. No pleural effusion. Upper Abdomen: Low-density splenic lesions are similar to the study ofm July of 2020. 1.3 cm lesion with low-density in the spleen at the periphery measuring water density on image 80 of series 4 at 13 mm as compared to 12 mm on the previous study. Smaller lesion seen inferior to this may have enlarged slightly but also measures water density. Imaged portions of the liver, gallbladder, pancreas and adrenal glands are normal. Musculoskeletal: No acute bone finding. No destructive bone process. Spinal degenerative changes. Review of the MIP images confirms the above findings. IMPRESSION: 1. Negative for acute pulmonary embolism. 2. Atelectasis and mild air trapping, could reflect small airways disease. 3. Mild-to-moderate generalized aortic atherosclerosis without aneurysm.  Three-vessel coronary artery calcification. 4. Low-density splenic lesions are similar to the study of July of 2020. One of these may have enlarged slightly but also measures water density. Findings are likely small cysts. 5. Aortic atherosclerosis. Aortic Atherosclerosis (ICD10-I70.0). Electronically Signed   By: GZetta BillsM.D.   On: 12/23/2020 15:06   CARDIAC CATHETERIZATION  Result Date: 12/25/2020  Ost LAD to Prox LAD lesion is 95% stenosed. Calcification moderate  Prox Cx lesion is 50% stenosed.  Unable to successfully cannulate and engaged right coronary artery which we think is nondominant  Mildly reduced borderline left ventricular function with anterior apical hypokinesis ejection fraction around 45 to 50%  Conclusion Inpatient diagnostic cardiac cath right radial approach.  LV function borderline to mildly depressed EF around 45 to 50% with anterior apical hypokinesis Coronaries Left main large relatively free of disease LAD was large with a complex calcified proximal lesion 95% Circumflex moderate disease but large dominant system Right coronary artery was  not successfully cannulated but appears to be nondominant and selective shots Complications Patient is being transferred to [facility] for possible complex intervention of proximal LAD versus CABG Patient's been transferred to [place]:     Code Status Orders  (From admission, onward)         Start     Ordered   12/24/20 0837  Limited resuscitation (code)  Continuous       Question Answer Comment  In the event of cardiac or respiratory ARREST: Initiate Code Blue, Call Rapid Response Yes   In the event of cardiac or respiratory ARREST: Perform CPR Yes   In the event of cardiac or respiratory ARREST: Perform Intubation/Mechanical Ventilation No   In the event of cardiac or respiratory ARREST: Use NIPPV/BiPAp only if indicated Yes   In the event of cardiac or respiratory ARREST: Administer ACLS  medications if indicated Yes   In the event of cardiac or respiratory ARREST: Perform Defibrillation or Cardioversion if indicated Yes   Comments per pt's request      12/24/20 0837        Code Status History    Date Active Date Inactive Code Status Order ID Comments User Context   12/23/2020 1808 12/24/2020 [place] DNR [id number]  Cox, Amy Delane Ginger, DO Inpatient   12/23/2020 1507 12/23/2020 1808 Full Code [id number]  Cox, [place], DO ED   12/07/2018 2322 12/09/2018 1911 Full Code [id number]  Lance Coon, MD ED   12/06/2013 1430 12/07/2013 1503 Full Code [id number]  Ailene Rud, MD Inpatient   Advance Care Planning Activity    Advance Directive Documentation   Flowsheet Row Most Recent Value  Type of Advance Directive Healthcare Power of Attorney, Living will  Pre-existing out of facility DNR order (yellow form or pink MOST form) --  "MOST" Form in Place? --       TOTAL TIME TAKING CARE OF THIS PATIENT: *45* minutes.    Fritzi Mandes M.D  [facility]    CC: Primary care physician; Rusty Aus, MD

## 2020-12-25 NOTE — Consult Note (Signed)
Phillip [name] for Heparin Indication: chest pain/ACS  Patient Measurements: Heparin Dosing Weight: 76 kg  Labs: Recent Labs    12/23/20 1200 12/23/20 1322 12/23/20 1340 12/23/20 1716 12/23/20 2140 12/24/20 0526 12/24/20 0711 12/24/20 1036 12/24/20 2009 12/25/20 0533  HGB 15.1  --   --   --   --  15.1  --   --   --  15.2  HCT 48.3  --   --   --   --  48.0  --   --   --  49.5  PLT 418*  --   --   --   --  382  --   --   --  435*  APTT  --   --   --  117*  --   --   --   --   --   --   LABPROT  --  19.3*  --   --   --   --   --   --   --   --   INR  --  1.7*  --   --   --   --   --   --   --   --   HEPARINUNFRC  --   --   --   --    < > 0.36  --   --  <0.10* 0.26*  CREATININE 1.38*  --   --   --   --  1.28*  --   --   --   --   TROPONINIHS 192*  --  637*  --   --   --  668* 543*  --   --    < > = values in this interval not displayed.    Estimated Creatinine Clearance: 50.6 mL/min (A) (by C-G formula based on SCr of 1.28 mg/dL (H)).   Medical History: Past Medical History:  Diagnosis Date  . Arthritis   . Basal cell carcinoma    L clavicle, L prox forearm- removed years ago   . Bladder cancer ([place])   . Bladder neck contracture   . Cataract   . DVT (deep venous thrombosis) ([place]) 11/25/2018   right le  . Dyslipidemia   . ED (erectile dysfunction)   . Frequency   . GERD (gastroesophageal reflux disease)   . History of pulmonary embolus (PE)   . Hypertension   . Hypothyroidism   . Incontinence of urine    sui, s/p cryoablation  . Kidney stones   . Neuropathy    feet  . Nocturia   . Prostate cancer ([place])    S/P   CRYOABLATION  . Right elbow tendinitis   . Vertigo    1-2x/yr  . Wears glasses    Heparin Dosing Weight: 76 kg  Assessment: 75 yo male with h/o bladder cancer (s/p cryoablation TURP), HTN, NIDDM, PE (provoked postop) who presents with substernal chest discomfort. Pharmacy consulted to start heparin for ACS/NSTEMI.  Trops elevated 192>637. On chronic warfarin for DVT/PE; INR on admit: 1.7.  Date Time HL Rate/Comment 4/6 2140 0.10 subthera, 900 >1200 un/hr 4/7 0526 0.36 thera x1; 1200 un/hr 4/8 0533 0.26 subtherapeutic  H/H/Plts: WNL  Goal of Therapy:  Heparin level 0.3-0.7 units/ml Monitor platelets by anticoagulation protocol: Yes   Plan:  --Patient went for cardiac catheterization today. Per RN, plan is for transfer for CABG evaluation  --Heparin drip was re-started at 1949 after procedure --Heparin lvl subtherapeutic --Bolus 1100  units x 1 --Increase heparin infusion to 1350 units/hr --Will re-check HL 8 hours from rate change --Daily CBC per protocol while on IV heparin  Renda Rolls, PharmD, [facility] 12/25/2020 6:24 AM

## 2020-12-25 NOTE — Progress Notes (Signed)
Pt is AAOx4. Pt c/o HA vicodin was given with relief. Pt's VSS. Pt right radial site is free from hematoma, no bleeding, and no swelling. All needs attended. Call light is within reach. Safety measures maintained. Will continue to monitor.

## 2020-12-25 NOTE — Progress Notes (Signed)
Patient arrived on stretcher from [facility] with EMS. He was found to have 3 vessel disease during cardiac cath and is transferred her for CABG. Right radial vascular site is clean, dry and intact. No hematoma. Patient is alert and oriented. Heparin drip running at 13.5 units/hr. Skin intact, CHG bath given. Patient oriented to staff and unit. Vital signs WNL. Currently resting in bed after eating lunch in no acute distress. Fuller Canada, RN

## 2020-12-25 NOTE — Progress Notes (Signed)
PHARMACIST - PHYSICIAN ORDER COMMUNICATION  CONCERNING: P&T Medication Policy on Herbal Medications  DESCRIPTION:  This patient's order for:  Chromium and Glucosamine-Chondroitin capsules  has been noted.  This product(s) is classified as an "herbal" or natural product. Due to a lack of definitive safety studies or FDA approval, nonstandard manufacturing practices, plus the potential risk of unknown drug-drug interactions while on inpatient medications, the Pharmacy and Therapeutics Committee does not permit the use of "herbal" or natural products of this type within [facility].   ACTION TAKEN: The pharmacy department is unable to verify this order at this time and your patient has been informed of this safety policy. Please reevaluate patient's clinical condition at discharge and address if the herbal or natural product(s) should be resumed at that time.

## 2020-12-25 NOTE — Progress Notes (Signed)
Brief progress note This was discussed with Dr. Fletcher Anon after he reviewed the images.  He agrees complex intervention is possible but should be done at the [facility].  He is hoping to make arrangements for transfer to [facility] for possible complex PCI stent of proximal LAD possibly with arthrectomy.  It possible that the procedure can be done today or Monday I think is reasonable to have the patient had breakfast now in case he has a procedure later on this afternoon.  Recommend maintaining heparin intravenously and continue to hold warfarin.  Continue blood pressure management and control continue statin therapy and aspirin. General plan was communicated with hospitalist we are waiting excepting physician and bed at [facility] prior to transfer.

## 2020-12-25 NOTE — Progress Notes (Signed)
[facility] Cardiology    SUBJECTIVE: Patient states to be doing reasonably well post procedure patient request transfer to [facility] for complex evaluation for proximal LAD PCI versus CABG.  Denies any chest pain no shortness of breath Case was discussed with patient and his wife   Vitals:   12/24/20 1830 12/24/20 1924 12/25/20 0507 12/25/20 0748  BP: (!) 151/91 (!) 146/81 (!) 151/84 (!) 144/81  Pulse: 72 75 60 66  Resp: 13 17 19 16   Temp:  98.4 F (36.9 C) 98.6 F (37 C) 98.4 F (36.9 C)  TempSrc:  Oral  Oral  SpO2: 99% 96% 98% 97%  Weight:      Height:         Intake/Output Summary (Last 24 hours) at 12/25/2020 5465 Last data filed at 12/24/2020 1559 Gross per 24 hour  Intake 869.23 ml  Output --  Net 869.23 ml      PHYSICAL EXAM  General: Well developed, well nourished, in no acute distress HEENT:  Normocephalic and atramatic Neck:  No JVD.  Lungs: Clear bilaterally to auscultation and percussion. Heart: HRRR . Normal S1 and S2 without gallops or murmurs.  Abdomen: Bowel sounds are positive, abdomen soft and non-tender  Msk:  Back normal, normal gait. Normal strength and tone for age. Extremities: No clubbing, cyanosis or edema.   Neuro: Alert and oriented X 3. Psych:  Good affect, responds appropriately   LABS: Basic Metabolic Panel: Recent Labs    12/23/20 1200 12/24/20 0526  NA 140 139  K 4.7 4.0  CL 110 108  CO2 25 25  GLUCOSE 120* 96  BUN 20 18  CREATININE 1.38* 1.28*  CALCIUM 8.8* 8.2*   Liver Function Tests: No results for input(s): AST, ALT, ALKPHOS, BILITOT, PROT, ALBUMIN in the last 72 hours. No results for input(s): LIPASE, AMYLASE in the last 72 hours. CBC: Recent Labs    12/24/20 0526 12/25/20 0533  WBC 8.9 14.4*  HGB 15.1 15.2  HCT 48.0 49.5  MCV 86.0 87.6  PLT 382 435*   Cardiac Enzymes: No results for input(s): CKTOTAL, CKMB, CKMBINDEX, TROPONINI in the last 72 hours. BNP: Invalid input(s): POCBNP D-Dimer: No results for input(s):  DDIMER in the last 72 hours. Hemoglobin A1C: No results for input(s): HGBA1C in the last 72 hours. Fasting Lipid Panel: Recent Labs    12/24/20 1036  CHOL 174  HDL 32*  LDLCALC 119*  TRIG 113  CHOLHDL 5.4   Thyroid Function Tests: No results for input(s): TSH, T4TOTAL, T3FREE, THYROIDAB in the last 72 hours.  Invalid input(s): FREET3 Anemia Panel: No results for input(s): VITAMINB12, FOLATE, FERRITIN, TIBC, IRON, RETICCTPCT in the last 72 hours.  DG Chest 2 View  Result Date: 12/23/2020 CLINICAL DATA:  Chest pain EXAM: CHEST - 2 VIEW COMPARISON:  None. FINDINGS: The heart size and mediastinal contours are within normal limits. Both lungs are clear. The visualized skeletal structures are unremarkable. IMPRESSION: No active cardiopulmonary disease. Electronically Signed   By: Franchot Gallo M.D.   On: 12/23/2020 12:54   CT Angio Chest PE W/Cm &/Or Wo Cm  Result Date: 12/23/2020 CLINICAL DATA:  Suspected pulmonary embolism, high probability in a 76 year old male. EXAM: CT ANGIOGRAPHY CHEST WITH CONTRAST TECHNIQUE: Multidetector CT imaging of the chest was performed using the standard protocol during bolus administration of intravenous contrast. Multiplanar CT image reconstructions and MIPs were obtained to evaluate the vascular anatomy. CONTRAST:  40mL OMNIPAQUE IOHEXOL 350 MG/ML SOLN COMPARISON:  December 07, 2018 FINDINGS: Cardiovascular: Mild  cardiac enlargement. No pericardial effusion. Three-vessel coronary artery calcification. Mild-to-moderate generalized aortic atherosclerosis without aneurysm. Normal caliber central pulmonary vessels. Negative for acute pulmonary embolism. Mediastinum/Nodes: Thoracic inlet structures are normal. No axillary lymphadenopathy. No mediastinal lymphadenopathy. No hilar lymphadenopathy. Esophagus grossly normal. Lungs/Pleura: Basilar atelectasis. Mild air trapping. No consolidation. No pleural effusion. Upper Abdomen: Low-density splenic lesions are similar to  the study ofm July of 2020. 1.3 cm lesion with low-density in the spleen at the periphery measuring water density on image 80 of series 4 at 13 mm as compared to 12 mm on the previous study. Smaller lesion seen inferior to this may have enlarged slightly but also measures water density. Imaged portions of the liver, gallbladder, pancreas and adrenal glands are normal. Musculoskeletal: No acute bone finding. No destructive bone process. Spinal degenerative changes. Review of the MIP images confirms the above findings. IMPRESSION: 1. Negative for acute pulmonary embolism. 2. Atelectasis and mild air trapping, could reflect small airways disease. 3. Mild-to-moderate generalized aortic atherosclerosis without aneurysm. Three-vessel coronary artery calcification. 4. Low-density splenic lesions are similar to the study of July of 2020. One of these may have enlarged slightly but also measures water density. Findings are likely small cysts. 5. Aortic atherosclerosis. Aortic Atherosclerosis (ICD10-I70.0). Electronically Signed   By: Zetta Bills M.D.   On: 12/23/2020 15:06     Echo Pending  TELEMETRY: Sinus rhythm nonspecific STTW changes  ASSESSMENT AND PLAN:  Principal Problem:   NSTEMI (non-ST elevated myocardial infarction) (HCC) Active Problems:   Pulmonary emboli (HCC)   GERD (gastroesophageal reflux disease)   HTN (hypertension)   HLD (hyperlipidemia)   Prostate cancer (HCC)   Bladder cancer (HCC)   Acute deep vein thrombosis (DVT) of calf muscle vein of right lower extremity (HCC)   Acquired hypothyroidism   Chemotherapy-induced neuropathy (HCC)   DM type 2 with diabetic mixed hyperlipidemia (HCC)   History of prostate cancer    Plan Status post cardiac cath for non-STEMI Patient was found have a complex lesion in proximal and mid LAD arrangements should be made for possible CABG versus complex PCI prefers to go to [facility] To make contact with Dr. Fletcher Anon to help review the films and  may be make arrangements to transfer to [facility] Continue heparin at previous drip rate New current cardiac medications as an inpatient prior to transfer Continue to hold Coumadin in favor of short-term IV heparin until definitive treatment and disposition Blood pressure management and control with Benzapril consider adding beta-blocker holding amlodipine for now Once decisions made for possible PCI will consider loading with Brilinta or Plavix Recommend patient remain as an inpatient until definitive treatment  Yolonda Kida, MD 12/25/2020 8:12 AM

## 2020-12-26 DIAGNOSIS — N1831 Chronic kidney disease, stage 3a: Secondary | ICD-10-CM

## 2020-12-26 DIAGNOSIS — I25110 Atherosclerotic heart disease of native coronary artery with unstable angina pectoris: Secondary | ICD-10-CM

## 2020-12-26 LAB — BASIC METABOLIC PANEL
Anion gap: 6 (ref 5–15)
BUN: 15 mg/dL (ref 8–23)
CO2: 23 mmol/L (ref 22–32)
Calcium: 8.9 mg/dL (ref 8.9–10.3)
Chloride: 108 mmol/L (ref 98–111)
Creatinine, Ser: 1.37 mg/dL — ABNORMAL HIGH (ref 0.61–1.24)
GFR, Estimated: 54 mL/min — ABNORMAL LOW (ref >60)
Glucose, Bld: 108 mg/dL — ABNORMAL HIGH (ref 70–99)
Potassium: 4 mmol/L (ref 3.5–5.1)
Sodium: 137 mmol/L (ref 135–145)

## 2020-12-26 LAB — CBC
HCT: 47 % (ref 39.0–52.0)
Hemoglobin: 15 g/dL (ref 13.0–17.0)
MCH: 27.9 pg (ref 26.0–34.0)
MCHC: 31.9 g/dL (ref 30.0–36.0)
MCV: 87.5 fL (ref 80.0–100.0)
Platelets: 361 10*3/uL (ref 150–400)
RBC: 5.37 MIL/uL (ref 4.22–5.81)
RDW: 15.9 % — ABNORMAL HIGH (ref 11.5–15.5)
WBC: 10.9 10*3/uL — ABNORMAL HIGH (ref 4.0–10.5)
nRBC: 0 % (ref 0.0–0.2)

## 2020-12-26 LAB — HEPARIN LEVEL (UNFRACTIONATED): Heparin Unfractionated: 0.37 IU/mL (ref 0.30–0.70)

## 2020-12-26 LAB — TSH: TSH: 7.433 u[IU]/mL — ABNORMAL HIGH (ref 0.350–4.500)

## 2020-12-26 MED ORDER — BISACODYL 10 MG RE SUPP: 10.0000 mg | Freq: Every day | RECTAL | Status: DC | PRN

## 2020-12-26 NOTE — Progress Notes (Signed)
Progress Note  Patient Name: Phillip [name] Date of Encounter: 12/26/2020  [facility] Cardiologist: Yolonda Kida, MD   Subjective   Comfortable this morning and chest pain free.   Cr 1.37  Inpatient Medications    Scheduled Meds: . amLODipine  5 mg Oral Daily  . aspirin EC  81 mg Oral Daily  . atorvastatin  80 mg Oral Daily  . levothyroxine  50 mcg Oral Q0600  . loratadine  10 mg Oral Daily  . melatonin  5 mg Oral QHS  . metoprolol tartrate  12.5 mg Oral BID  . multivitamin with minerals  1 tablet Oral Daily  . pantoprazole  40 mg Oral Daily  . sodium chloride flush  3 mL Intravenous Q12H  . sodium chloride flush  3 mL Intravenous Q12H   Continuous Infusions: . sodium chloride    . heparin 1,450 Units/hr (12/25/20 1513)   PRN Meds: sodium chloride, acetaminophen, nitroGLYCERIN, ondansetron (ZOFRAN) IV, sodium chloride flush   Vital Signs    Vitals:   12/25/20 2048 12/25/20 2329 12/26/20 0332 12/26/20 0750  BP: (!) 145/87 135/85 (!) 141/76 (!) 141/88  Pulse: 100 97 92   Resp: 15 14 15 16   Temp: 98.5 F (36.9 C) 97.9 F (36.6 C) 97.7 F (36.5 C) 98.5 F (36.9 C)  TempSrc: Oral Oral Oral Oral  SpO2: 98% 98% 97% 96%    Intake/Output Summary (Last 24 hours) at 12/26/2020 0944 Last data filed at 12/25/2020 1853 Gross per 24 hour  Intake 960 ml  Output 175 ml  Net 785 ml   Last 3 Weights 12/24/2020 12/23/2020 12/23/2020  Weight (lbs) 166 lb 166 lb 6.4 oz 167 lb 15.9 oz  Weight (kg) 75.297 kg 75.479 kg 76.2 kg      Telemetry    NSR with rare PVCs - Personally Reviewed  ECG    No new tracing - Personally Reviewed  Physical Exam   GEN: No acute distress.   Neck: No JVD Cardiac: RRR, 2/6 systolic murmur. No rubs or gallops Respiratory: Clear to auscultation bilaterally. GI: Soft, nontender, non-distended  MS: No edema; No deformity. Neuro:  Nonfocal  Psych: Normal affect   Labs    High Sensitivity Troponin:   Recent Labs  Lab  12/23/20 1200 12/23/20 1340 12/24/20 0711 12/24/20 1036  TROPONINIHS 192* 637* 668* 543*      Chemistry Recent Labs  Lab 12/23/20 1200 12/24/20 0526 12/26/20 0120  NA 140 139 137  K 4.7 4.0 4.0  CL 110 108 108  CO2 25 25 23   GLUCOSE 120* 96 108*  BUN 20 18 15   CREATININE 1.38* 1.28* 1.37*  CALCIUM 8.8* 8.2* 8.9  GFRNONAA 54* 59* 54*  ANIONGAP 5 6 6      Hematology Recent Labs  Lab 12/24/20 0526 12/25/20 0533 12/26/20 0120  WBC 8.9 14.4* 10.9*  RBC 5.58 5.65 5.37  HGB 15.1 15.2 15.0  HCT 48.0 49.5 47.0  MCV 86.0 87.6 87.5  MCH 27.1 26.9 27.9  MCHC 31.5 30.7 31.9  RDW 15.7* 15.9* 15.9*  PLT 382 435* 361    BNPNo results for input(s): BNP, PROBNP in the last 168 hours.   DDimer No results for input(s): DDIMER in the last 168 hours.   Radiology    CARDIAC CATHETERIZATION  Result Date: 12/25/2020  Ost LAD to Prox LAD lesion is 95% stenosed. Calcification moderate  Prox Cx lesion is 50% stenosed.  Unable to successfully cannulate and engaged right coronary artery which we  think is nondominant  Mildly reduced borderline left ventricular function with anterior apical hypokinesis ejection fraction around 45 to 50%  Conclusion Inpatient diagnostic cardiac cath right radial approach.  LV function borderline to mildly depressed EF around 45 to 50% with anterior apical hypokinesis Coronaries Left main large relatively free of disease LAD was large with a complex calcified proximal lesion 95% Circumflex moderate disease but large dominant system Right coronary artery was not successfully cannulated but appears to be nondominant and selective shots Complications Patient is being transferred to [facility] for possible complex intervention of proximal LAD versus CABG Patient's been transferred to [facility]   ECHOCARDIOGRAM COMPLETE  Result Date: 12/25/2020    ECHOCARDIOGRAM REPORT   Patient Name:   Phillip [name] Date of Exam: 12/25/2020 Medical Rec #:  [id number]         Height:       69.0 in Accession #:    [id number]       Weight:       166.0 lb Date of Birth:  [birth year]        BSA:          1.909 m Patient Age:    75 years         BP:           150/84 mmHg Patient Gender: M                HR:           62 bpm. Exam Location:  Inpatient Procedure: 2D Echo, Cardiac Doppler and Color Doppler Indications:    R07.9* Chest pain, unspecified  History:        Patient has prior history of Echocardiogram examinations, most                 recent 12/08/2018. Acute MI and CAD; Risk Factors:Dyslipidemia.  Sonographer:    Merrie Roof Referring Phys: [place]  1. Left ventricular ejection fraction, by estimation, is 50 to 55%. The left ventricle has low normal function. The left ventricle has no regional wall motion abnormalities. There is mild left ventricular hypertrophy. Left ventricular diastolic parameters are consistent with Grade I diastolic dysfunction (impaired relaxation).  2. Right ventricular systolic function is normal. The right ventricular size is normal. Tricuspid regurgitation signal is inadequate for assessing PA pressure.  3. Left atrial size was moderately dilated.  4. The mitral valve is normal in structure. Trivial mitral valve regurgitation.  5. The aortic valve is tricuspid. Aortic valve regurgitation is mild. Mild aortic valve sclerosis is present, with no evidence of aortic valve stenosis.  6. The inferior vena cava is normal in size with greater than 50% respiratory variability, suggesting right atrial pressure of 3 mmHg. FINDINGS  Left Ventricle: Left ventricular ejection fraction, by estimation, is 50 to 55%. The left ventricle has low normal function. The left ventricle has no regional wall motion abnormalities. The left ventricular internal cavity size was normal in size. There is mild left ventricular hypertrophy. Left ventricular diastolic parameters are consistent with Grade I diastolic dysfunction (impaired relaxation). Right  Ventricle: The right ventricular size is normal. No increase in right ventricular wall thickness. Right ventricular systolic function is normal. Tricuspid regurgitation signal is inadequate for assessing PA pressure. Left Atrium: Left atrial size was moderately dilated. Right Atrium: Right atrial size was normal in size. Pericardium: There is no evidence of pericardial effusion. Mitral Valve: The mitral valve is normal in structure. Trivial mitral  valve regurgitation. Tricuspid Valve: The tricuspid valve is normal in structure. Tricuspid valve regurgitation is trivial. Aortic Valve: The aortic valve is tricuspid. Aortic valve regurgitation is mild. Mild aortic valve sclerosis is present, with no evidence of aortic valve stenosis. Aortic valve mean gradient measures 7.0 mmHg. Aortic valve peak gradient measures 11.8 mmHg. Aortic valve area, by VTI measures 1.82 cm. Pulmonic Valve: The pulmonic valve was not well visualized. Pulmonic valve regurgitation is not visualized. Aorta: The aortic root and ascending aorta are structurally normal, with no evidence of dilitation. Venous: The inferior vena cava is normal in size with greater than 50% respiratory variability, suggesting right atrial pressure of 3 mmHg. IAS/Shunts: No atrial level shunt detected by color flow Doppler.  LEFT VENTRICLE PLAX 2D LVIDd:         4.60 cm  Diastology LVIDs:         3.40 cm  LV e' medial:    4.68 cm/s LV PW:         1.10 cm  LV E/e' medial:  9.4 LV IVS:        1.00 cm  LV e' lateral:   3.48 cm/s LVOT diam:     2.10 cm  LV E/e' lateral: 12.7 LV SV:         61 LV SV Index:   32 LVOT Area:     3.46 cm  RIGHT VENTRICLE RV Basal diam:  3.90 cm LEFT ATRIUM             Index       RIGHT ATRIUM           Index LA diam:        4.40 cm 2.31 cm/m  RA Area:     17.80 cm LA Vol (A2C):   91.8 ml 48.10 ml/m RA Volume:   37.80 ml  19.80 ml/m LA Vol (A4C):   90.8 ml 47.57 ml/m LA Biplane Vol: 92.4 ml 48.41 ml/m  AORTIC VALVE AV Area (Vmax):    1.77  cm AV Area (Vmean):   1.87 cm AV Area (VTI):     1.82 cm AV Vmax:           172.00 cm/s AV Vmean:          123.000 cm/s AV VTI:            0.337 m AV Peak Grad:      11.8 mmHg AV Mean Grad:      7.0 mmHg LVOT Vmax:         88.10 cm/s LVOT Vmean:        66.300 cm/s LVOT VTI:          0.177 m LVOT/AV VTI ratio: 0.53  AORTA Ao Root diam: 3.80 cm Ao Asc diam:  3.70 cm MITRAL VALVE MV Area (PHT): 3.85 cm    SHUNTS MV Decel Time: 197 msec    Systemic VTI:  0.18 m MV E velocity: 44.10 cm/s  Systemic Diam: 2.10 cm MV A velocity: 83.10 cm/s MV E/A ratio:  0.53 Oswaldo Milian MD Electronically signed by Oswaldo Milian MD Signature Date/Time: 12/25/2020/9:52:39 PM    Final     Cardiac Studies   Cath 12/24/20:  Colon Flattery LAD to Prox LAD lesion is 95% stenosed. Calcification moderate  Prox Cx lesion is 50% stenosed.  Unable to successfully cannulate and engaged right coronary artery which we think is nondominant  Mildly reduced borderline left ventricular function with anterior apical hypokinesis ejection fraction around 45  to 50%   Conclusion Inpatient diagnostic cardiac cath right radial approach.   LV function borderline to mildly depressed EF around 45 to 50% with anterior apical hypokinesis Coronaries Left main large relatively free of disease LAD was large with a complex calcified proximal lesion 95% Circumflex moderate disease but large dominant system Right coronary artery was not successfully cannulated but appears to be nondominant and selective shots Complications Patient is being transferred to [facility] for possible complex intervention of proximal LAD versus CABG Patient's been transferred to [facility]  TTE 12/25/20: IMPRESSIONS  1. Left ventricular ejection fraction, by estimation, is 50 to 55%. The  left ventricle has low normal function. The left ventricle has no regional  wall motion abnormalities. There is mild left ventricular hypertrophy.  Left ventricular diastolic   parameters are consistent with Grade I diastolic dysfunction (impaired  relaxation).  2. Right ventricular systolic function is normal. The right ventricular  size is normal. Tricuspid regurgitation signal is inadequate for assessing  PA pressure.  3. Left atrial size was moderately dilated.  4. The mitral valve is normal in structure. Trivial mitral valve  regurgitation.  5. The aortic valve is tricuspid. Aortic valve regurgitation is mild.  Mild aortic valve sclerosis is present, with no evidence of aortic valve  stenosis.  6. The inferior vena cava is normal in size with greater than 50%  respiratory variability, suggesting right atrial pressure of 3 mmHg.   Patient Profile     75 y.o. male with history of hypertension, hyperlipidemia, CKD stage III, DVT (on Coumadin), hypothyroidism who was admittedon 12/25/2020 as a direct transfer from [facility].  Initially presented there on 12/23/2020 for non-STEMI.  He underwent left heart catheterization at [place] [facility] on 12/25/20 and was found to have an ostial LAD lesion that was unable to be intervened upon prompting transfer to [place]    #NSTEMI: Patient presented to [place] [facility] on 12/23/20 with chest pain found to have NSTEMI. Cath 12/25/20 with heavily calcified ostial LAD lesion, 40% ostial circumflex lesion, RCA was unable to be cannulated. Now transferred to [facility] for atherectomy and PCI with Dr. Burt Knack likely on 12/28/20. Currently chest pain free.  -Continue ASA 81mg  daily -Continue atorvastatin 80mg  daily -Continue heparin gtt -Continue metop tartrate 12.5mg  BID-->change to long-acting prior to discharge -Nitro prn for chest pain -Plan for atherectomy and LAD PCI on 12/28/20  #History of DVT/PE: -Holding warfarin given need for cath; consider DOAC on discharge (will check into co-pay) -Continue heparin gtt  #HTN: -Continue amlodipine 5mg  daily -Continue metop as above  #HLD: -Continue  atorvastatin 80mg  daily  #CKD stage III: -Monitor closely   For questions or updates, please contact [facility] Please consult [email or web address] for contact info under        Signed, Freada Bergeron, MD  12/26/2020, 9:44 AM

## 2020-12-26 NOTE — Consult Note (Signed)
[place] for Heparin Indication: chest pain/ACS  Patient Measurements: Heparin Dosing Weight: 75.3kg  Labs: Recent Labs    12/23/20 1200 12/23/20 1322 12/23/20 1340 12/23/20 1716 12/23/20 2140 12/24/20 0526 12/24/20 0711 12/24/20 1036 12/24/20 2009 12/25/20 0533 12/25/20 1341 12/26/20 0120  HGB 15.1  --   --   --   --  15.1  --   --   --  15.2  --  15.0  HCT 48.3  --   --   --   --  48.0  --   --   --  49.5  --  47.0  PLT 418*  --   --   --   --  382  --   --   --  435*  --  361  APTT  --   --   --  117*  --   --   --   --   --   --   --   --   LABPROT  --  19.3*  --   --   --   --   --   --   --   --   --   --   INR  --  1.7*  --   --   --   --   --   --   --   --   --   --   HEPARINUNFRC  --   --   --   --    < > 0.36  --   --    < > 0.26* 0.28* 0.37  CREATININE 1.38*  --   --   --   --  1.28*  --   --   --   --   --  1.37*  TROPONINIHS 192*  --  637*  --   --   --  668* 543*  --   --   --   --    < > = values in this interval not displayed.    Estimated Creatinine Clearance: 47.3 mL/min (A) (by C-G formula based on SCr of 1.37 mg/dL (H)).   Medical History: Past Medical History:  Diagnosis Date  . Arthritis   . Basal cell carcinoma    L clavicle, L prox forearm- removed years ago   . Bladder cancer ([place])   . Bladder neck contracture   . CAD (coronary artery disease)    a. 12/2020 NSTEMI/Cath: LM nl, LAD 95ost/p, LCX 50p, RCA nl. EF 45-50%.  . Cataract   . CKD (chronic kidney disease), stage III ([place])   . ED (erectile dysfunction)   . Frequency   . GERD (gastroesophageal reflux disease)   . 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 Following LE DVT-->Chronic warfarin.  Marland Kitchen Hyperlipidemia LDL goal <70   . Hypertension   . Hypothyroidism   . Incontinence of urine    sui, s/p cryoablation  . Ischemic cardiomyopathy    a. 11/2018 Echo: EF 60-65%; b. 12/2020 LV Gram: EF 45-50% in setting of NSTEMI.  Marland Kitchen Kidney stones   .  Neuropathy    feet  . Nocturia   . Prostate cancer ([place])    S/P   CRYOABLATION  . Right elbow tendinitis   . Right Lower Extremity DVT (deep venous thrombosis) ([place]) 11/25/2018  . Vertigo    1-2x/yr  . Wears glasses     Assessment: 75 yo male with h/o bladder cancer (s/p  cryoablation TURP), HTN, NIDDM, PE (provoked postop) who presents with substernal chest discomfort. Pharmacy consulted to start heparin for ACS/NSTEMI. Trops elevated 192>637. On chronic warfarin for DVT/PE; INR on admit was 1.7.   Heparin level this AM is therapeutic at 0.37. CBC stable, no IV or bleeding issues noted. Plan is to continue IV heparin with PCI on Monday 4/11.   Goal of Therapy:  Heparin level 0.3-0.7 units/ml Monitor platelets by anticoagulation protocol: Yes   Plan:  Continue IV heparin at 1,450 units/hr Monitor daily HL, CBC, s/sx bleeding Follow plans for DOAC at discharge   Mercy Riding, PharmD PGY1 [place] Resident Please refer to [facility] for unit-specific pharmacist

## 2020-12-27 LAB — BASIC METABOLIC PANEL
Anion gap: 9 (ref 5–15)
BUN: 15 mg/dL (ref 8–23)
CO2: 21 mmol/L — ABNORMAL LOW (ref 22–32)
Calcium: 8.9 mg/dL (ref 8.9–10.3)
Chloride: 108 mmol/L (ref 98–111)
Creatinine, Ser: 1.4 mg/dL — ABNORMAL HIGH (ref 0.61–1.24)
GFR, Estimated: 53 mL/min — ABNORMAL LOW (ref >60)
Glucose, Bld: 95 mg/dL (ref 70–99)
Potassium: 4.3 mmol/L (ref 3.5–5.1)
Sodium: 138 mmol/L (ref 135–145)

## 2020-12-27 LAB — HEPARIN LEVEL (UNFRACTIONATED): Heparin Unfractionated: 0.55 IU/mL (ref 0.30–0.70)

## 2020-12-27 LAB — CBC
HCT: 45.5 % (ref 39.0–52.0)
Hemoglobin: 14.3 g/dL (ref 13.0–17.0)
MCH: 27.4 pg (ref 26.0–34.0)
MCHC: 31.4 g/dL (ref 30.0–36.0)
MCV: 87.3 fL (ref 80.0–100.0)
Platelets: 397 10*3/uL (ref 150–400)
RBC: 5.21 MIL/uL (ref 4.22–5.81)
RDW: 15.9 % — ABNORMAL HIGH (ref 11.5–15.5)
WBC: 10.3 10*3/uL (ref 4.0–10.5)
nRBC: 0 % (ref 0.0–0.2)

## 2020-12-27 MED ORDER — SODIUM CHLORIDE 0.9 % IV SOLN: 250.0000 mL | INTRAVENOUS | Status: DC | PRN

## 2020-12-27 MED ORDER — SODIUM CHLORIDE 0.9 % WEIGHT BASED INFUSION
3.0000 mL/kg/h | INTRAVENOUS | Status: DC
  Administered 2020-12-28: 3 mL/kg/h via INTRAVENOUS

## 2020-12-27 MED ORDER — SODIUM CHLORIDE 0.9 % WEIGHT BASED INFUSION
1.0000 mL/kg/h | INTRAVENOUS | Status: DC
  Administered 2020-12-28: 1 mL/kg/h via INTRAVENOUS

## 2020-12-27 MED ORDER — ASPIRIN 81 MG PO CHEW
81.0000 mg | CHEWABLE_TABLET | ORAL | Status: AC
  Administered 2020-12-28: 81 mg via ORAL
  Filled 2020-12-27: qty 1

## 2020-12-27 MED ORDER — SODIUM CHLORIDE 0.9% FLUSH: 3.0000 mL | INTRAVENOUS | Status: DC | PRN

## 2020-12-27 NOTE — Consult Note (Signed)
[place] for Heparin Indication: chest pain/ACS, h/o PE/DVT   Patient Measurements: Heparin Dosing Weight: 75.3kg  Labs: Recent Labs    12/24/20 1036 12/24/20 2009 12/25/20 0533 12/25/20 1341 12/26/20 0120 12/27/20 0056  HGB  --    < > 15.2  --  15.0 14.3  HCT  --   --  49.5  --  47.0 45.5  PLT  --   --  435*  --  361 397  HEPARINUNFRC  --    < > 0.26* 0.28* 0.37 0.55  CREATININE  --   --   --   --  1.37*  --   TROPONINIHS 543*  --   --   --   --   --    < > = values in this interval not displayed.    Estimated Creatinine Clearance: 47.3 mL/min (A) (by C-G formula based on SCr of 1.37 mg/dL (H)).   Medical History: Past Medical History:  Diagnosis Date  . Arthritis   . Basal cell carcinoma    L clavicle, L prox forearm- removed years ago   . Bladder cancer ([place])   . Bladder neck contracture   . CAD (coronary artery disease)    a. 12/2020 NSTEMI/Cath: LM nl, LAD 95ost/p, LCX 50p, RCA nl. EF 45-50%.  . Cataract   . CKD (chronic kidney disease), stage III ([place])   . ED (erectile dysfunction)   . Frequency   . GERD (gastroesophageal reflux disease)   . 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 Following LE DVT-->Chronic warfarin.  Marland Kitchen Hyperlipidemia LDL goal <70   . Hypertension   . Hypothyroidism   . Incontinence of urine    sui, s/p cryoablation  . Ischemic cardiomyopathy    a. 11/2018 Echo: EF 60-65%; b. 12/2020 LV Gram: EF 45-50% in setting of NSTEMI.  Marland Kitchen Kidney stones   . Neuropathy    feet  . Nocturia   . Prostate cancer ([place])    S/P   CRYOABLATION  . Right elbow tendinitis   . Right Lower Extremity DVT (deep venous thrombosis) ([place]) 11/25/2018  . Vertigo    1-2x/yr  . Wears glasses     Assessment: 75 yo male with h/o bladder cancer (s/p cryoablation TURP), HTN, NIDDM, PE (provoked postop) who presents with substernal chest discomfort. Pharmacy consulted to start heparin for ACS/NSTEMI. Trops elevated 192>637. On  chronic warfarin for DVT/PE; INR on admit was 1.7.   Heparin level this AM is therapeutic at 0.55. CBC stable, no IV or bleeding issues noted. Plan is to continue IV heparin with PCI on Monday 4/11.   Goal of Therapy:  Heparin level 0.3-0.7 units/ml Monitor platelets by anticoagulation protocol: Yes   Plan:  Continue IV heparin at 1,450 units/hr Monitor daily HL, CBC, s/sx bleeding Follow plans for DOAC at discharge   Mercy Riding, PharmD PGY1 [place] Resident Please refer to [facility] for unit-specific pharmacist

## 2020-12-27 NOTE — Progress Notes (Signed)
Progress Note  Patient Name: Phillip [name] Date of Encounter: 12/27/2020  [facility] Cardiologist: Yolonda Kida, MD   Subjective   Doing well. Ambulating the hallway. No chest pain  Inpatient Medications    Scheduled Meds: . amLODipine  5 mg Oral Daily  . aspirin EC  81 mg Oral Daily  . atorvastatin  80 mg Oral Daily  . levothyroxine  50 mcg Oral Q0600  . loratadine  10 mg Oral Daily  . melatonin  5 mg Oral QHS  . metoprolol tartrate  12.5 mg Oral BID  . multivitamin with minerals  1 tablet Oral Daily  . pantoprazole  40 mg Oral Daily  . sodium chloride flush  3 mL Intravenous Q12H  . sodium chloride flush  3 mL Intravenous Q12H   Continuous Infusions: . sodium chloride    . heparin 1,450 Units/hr (12/26/20 1900)   PRN Meds: sodium chloride, acetaminophen, bisacodyl, nitroGLYCERIN, ondansetron (ZOFRAN) IV, sodium chloride flush   Vital Signs    Vitals:   12/27/20 0548 12/27/20 0728 12/27/20 0730 12/27/20 0817  BP: 116/79  134/76   Pulse: 73  (!) 59 66  Resp: 17  14   Temp: 98.5 F (36.9 C) 98.4 F (36.9 C)    TempSrc: Oral Oral    SpO2: 95%  98%   Weight:        Intake/Output Summary (Last 24 hours) at 12/27/2020 1035 Last data filed at 12/27/2020 0700 Gross per 24 hour  Intake 299 ml  Output 1700 ml  Net -1401 ml   Last 3 Weights 12/27/2020 12/24/2020 12/23/2020  Weight (lbs) 164 lb 4.8 oz 166 lb 166 lb 6.4 oz  Weight (kg) 74.526 kg 75.297 kg 75.479 kg      Telemetry    NSR - Personally Reviewed  ECG    No new tracing - Personally Reviewed  Physical Exam   GEN: No acute distress.   Neck: No JVD Cardiac: RRR, soft systolic murmur best heard at RUSB. No rubs or gallops Respiratory: Clear to auscultation bilaterally. GI: Soft, nontender, non-distended  MS: No edema; No deformity. Neuro:  Nonfocal  Psych: Normal affect   Labs    High Sensitivity Troponin:   Recent Labs  Lab 12/23/20 1200 12/23/20 1340 12/24/20 0711  12/24/20 1036  TROPONINIHS 192* 637* 668* 543*      Chemistry Recent Labs  Lab 12/23/20 1200 12/24/20 0526 12/26/20 0120  NA 140 139 137  K 4.7 4.0 4.0  CL 110 108 108  CO2 25 25 23   GLUCOSE 120* 96 108*  BUN 20 18 15   CREATININE 1.38* 1.28* 1.37*  CALCIUM 8.8* 8.2* 8.9  GFRNONAA 54* 59* 54*  ANIONGAP 5 6 6      Hematology Recent Labs  Lab 12/25/20 0533 12/26/20 0120 12/27/20 0056  WBC 14.4* 10.9* 10.3  RBC 5.65 5.37 5.21  HGB 15.2 15.0 14.3  HCT 49.5 47.0 45.5  MCV 87.6 87.5 87.3  MCH 26.9 27.9 27.4  MCHC 30.7 31.9 31.4  RDW 15.9* 15.9* 15.9*  PLT 435* 361 397    BNPNo results for input(s): BNP, PROBNP in the last 168 hours.   DDimer No results for input(s): DDIMER in the last 168 hours.   Radiology    ECHOCARDIOGRAM COMPLETE  Result Date: 12/25/2020    ECHOCARDIOGRAM REPORT   Patient Name:   WINFERD WEASE Date of Exam: 12/25/2020 Medical Rec #:  [id number]        Height:  69.0 in Accession #:    [id number]       Weight:       166.0 lb Date of Birth:  [birth year]        BSA:          1.909 m Patient Age:    75 years         BP:           150/84 mmHg Patient Gender: M                HR:           62 bpm. Exam Location:  Inpatient Procedure: 2D Echo, Cardiac Doppler and Color Doppler Indications:    R07.9* Chest pain, unspecified  History:        Patient has prior history of Echocardiogram examinations, most                 recent 12/08/2018. Acute MI and CAD; Risk Factors:Dyslipidemia.  Sonographer:    Merrie Roof Referring Phys: [place]  1. Left ventricular ejection fraction, by estimation, is 50 to 55%. The left ventricle has low normal function. The left ventricle has no regional wall motion abnormalities. There is mild left ventricular hypertrophy. Left ventricular diastolic parameters are consistent with Grade I diastolic dysfunction (impaired relaxation).  2. Right ventricular systolic function is normal. The right ventricular  size is normal. Tricuspid regurgitation signal is inadequate for assessing PA pressure.  3. Left atrial size was moderately dilated.  4. The mitral valve is normal in structure. Trivial mitral valve regurgitation.  5. The aortic valve is tricuspid. Aortic valve regurgitation is mild. Mild aortic valve sclerosis is present, with no evidence of aortic valve stenosis.  6. The inferior vena cava is normal in size with greater than 50% respiratory variability, suggesting right atrial pressure of 3 mmHg. FINDINGS  Left Ventricle: Left ventricular ejection fraction, by estimation, is 50 to 55%. The left ventricle has low normal function. The left ventricle has no regional wall motion abnormalities. The left ventricular internal cavity size was normal in size. There is mild left ventricular hypertrophy. Left ventricular diastolic parameters are consistent with Grade I diastolic dysfunction (impaired relaxation). Right Ventricle: The right ventricular size is normal. No increase in right ventricular wall thickness. Right ventricular systolic function is normal. Tricuspid regurgitation signal is inadequate for assessing PA pressure. Left Atrium: Left atrial size was moderately dilated. Right Atrium: Right atrial size was normal in size. Pericardium: There is no evidence of pericardial effusion. Mitral Valve: The mitral valve is normal in structure. Trivial mitral valve regurgitation. Tricuspid Valve: The tricuspid valve is normal in structure. Tricuspid valve regurgitation is trivial. Aortic Valve: The aortic valve is tricuspid. Aortic valve regurgitation is mild. Mild aortic valve sclerosis is present, with no evidence of aortic valve stenosis. Aortic valve mean gradient measures 7.0 mmHg. Aortic valve peak gradient measures 11.8 mmHg. Aortic valve area, by VTI measures 1.82 cm. Pulmonic Valve: The pulmonic valve was not well visualized. Pulmonic valve regurgitation is not visualized. Aorta: The aortic root and ascending  aorta are structurally normal, with no evidence of dilitation. Venous: The inferior vena cava is normal in size with greater than 50% respiratory variability, suggesting right atrial pressure of 3 mmHg. IAS/Shunts: No atrial level shunt detected by color flow Doppler.  LEFT VENTRICLE PLAX 2D LVIDd:         4.60 cm  Diastology LVIDs:         3.40  cm  LV e' medial:    4.68 cm/s LV PW:         1.10 cm  LV E/e' medial:  9.4 LV IVS:        1.00 cm  LV e' lateral:   3.48 cm/s LVOT diam:     2.10 cm  LV E/e' lateral: 12.7 LV SV:         61 LV SV Index:   32 LVOT Area:     3.46 cm  RIGHT VENTRICLE RV Basal diam:  3.90 cm LEFT ATRIUM             Index       RIGHT ATRIUM           Index LA diam:        4.40 cm 2.31 cm/m  RA Area:     17.80 cm LA Vol (A2C):   91.8 ml 48.10 ml/m RA Volume:   37.80 ml  19.80 ml/m LA Vol (A4C):   90.8 ml 47.57 ml/m LA Biplane Vol: 92.4 ml 48.41 ml/m  AORTIC VALVE AV Area (Vmax):    1.77 cm AV Area (Vmean):   1.87 cm AV Area (VTI):     1.82 cm AV Vmax:           172.00 cm/s AV Vmean:          123.000 cm/s AV VTI:            0.337 m AV Peak Grad:      11.8 mmHg AV Mean Grad:      7.0 mmHg LVOT Vmax:         88.10 cm/s LVOT Vmean:        66.300 cm/s LVOT VTI:          0.177 m LVOT/AV VTI ratio: 0.53  AORTA Ao Root diam: 3.80 cm Ao Asc diam:  3.70 cm MITRAL VALVE MV Area (PHT): 3.85 cm    SHUNTS MV Decel Time: 197 msec    Systemic VTI:  0.18 m MV E velocity: 44.10 cm/s  Systemic Diam: 2.10 cm MV A velocity: 83.10 cm/s MV E/A ratio:  0.53 Oswaldo Milian MD Electronically signed by Oswaldo Milian MD Signature Date/Time: 12/25/2020/9:52:39 PM    Final     Cardiac Studies    Cath 12/24/20:  Colon Flattery LAD to Prox LAD lesion is 95% stenosed. Calcification moderate  Prox Cx lesion is 50% stenosed.  Unable to successfully cannulate and engaged right coronary artery which we think is nondominant  Mildly reduced borderline left ventricular function with anterior apical hypokinesis  ejection fraction around 45 to 50%  Conclusion Inpatient diagnostic cardiac cath right radial approach.  LV function borderline to mildly depressed EF around 45 to 50% with anterior apical hypokinesis Coronaries Left main large relatively free of disease LAD was large with a complex calcified proximal lesion 95% Circumflex moderate disease but large dominant system Right coronary artery was not successfully cannulated but appears to be nondominant and selective shots Complications Patient is being transferred to [facility] for possible complex intervention of proximal LAD versus CABG Patient's been transferred to [facility]  TTE 12/25/20: IMPRESSIONS  1. Left ventricular ejection fraction, by estimation, is 50 to 55%. The  left ventricle has low normal function. The left ventricle has no regional  wall motion abnormalities. There is mild left ventricular hypertrophy.  Left ventricular diastolic  parameters are consistent with Grade I diastolic dysfunction (impaired  relaxation).  2. Right ventricular systolic function  is normal. The right ventricular  size is normal. Tricuspid regurgitation signal is inadequate for assessing  PA pressure.  3. Left atrial size was moderately dilated.  4. The mitral valve is normal in structure. Trivial mitral valve  regurgitation.  5. The aortic valve is tricuspid. Aortic valve regurgitation is mild.  Mild aortic valve sclerosis is present, with no evidence of aortic valve  stenosis.  6. The inferior vena cava is normal in size with greater than 50%  respiratory variability, suggesting right atrial pressure of 3 mmHg.   Patient Profile     75 y.o. 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 Initially presented there on 12/23/2020 for non-STEMI. He underwent left heart catheterization at [place] [facility] on  12/25/20 and was found to have an ostial LAD lesion that was unable to be intervened upon prompting transfer to [place]    #NSTEMI: Patient presented to [place] [facility] on 12/23/20 with chest pain found to have NSTEMI. Cath 12/25/20 with heavily calcified ostial LAD lesion, 40% ostial circumflex lesion, RCA was unable to be cannulated.Now transferred to [facility] for atherectomy and PCI with Dr. Burt Knack likely on 12/28/20. Currently chest pain free.  -Continue ASA 81mg  daily -Continue atorvastatin 80mg  daily -Continue heparin gtt -Continue metop tartrate 12.5mg  BID-->change to long-acting prior to discharge -Nitro prn for chest pain -Plan for possible atherectomy and LAD PCI on 12/28/20; will keep NPO at [place]  #History of DVT/PE: -Holding warfarin given need for cath; consider DOAC on discharge (check into co-pay prior to discharge) -Continue heparin gtt  #HTN: -Continue amlodipine 5mg  daily -Continue metop as above  #HLD: -Continue atorvastatin 80mg  daily  #CKD stage III: -Monitor closely  For questions or updates, please contact [facility] Please consult [email or web address] for contact info under        Signed, Freada Bergeron, MD  12/27/2020, 10:35 AM

## 2020-12-28 ENCOUNTER — Other Ambulatory Visit: Payer: Self-pay

## 2020-12-28 ENCOUNTER — Other Ambulatory Visit (HOSPITAL_COMMUNITY): Payer: Self-pay

## 2020-12-28 ENCOUNTER — Encounter (HOSPITAL_COMMUNITY): Payer: Self-pay | Admitting: Cardiovascular Disease

## 2020-12-28 ENCOUNTER — Encounter (HOSPITAL_COMMUNITY)
Admission: AD | Disposition: A | Discharge status: Home / Self Care | Payer: Self-pay | Source: Other Acute Inpatient Hospital | Attending: Cardiovascular Disease

## 2020-12-28 ENCOUNTER — Ambulatory Visit (HOSPITAL_COMMUNITY): Admission: RE | Admit: 2020-12-28 | Payer: Medicare Other | Source: Home / Self Care | Admitting: Cardiovascular Disease

## 2020-12-28 DIAGNOSIS — I10 Essential (primary) hypertension: Secondary | ICD-10-CM

## 2020-12-28 DIAGNOSIS — Z86718 Personal history of other venous thrombosis and embolism: Secondary | ICD-10-CM

## 2020-12-28 DIAGNOSIS — N1832 Chronic kidney disease, stage 3b: Secondary | ICD-10-CM

## 2020-12-28 DIAGNOSIS — I2510 Atherosclerotic heart disease of native coronary artery without angina pectoris: Secondary | ICD-10-CM

## 2020-12-28 HISTORY — PX: CORONARY ANGIOGRAPHY: CATH118303

## 2020-12-28 HISTORY — PX: CORONARY STENT INTERVENTION: CATH118234

## 2020-12-28 LAB — CBC
HCT: 48.8 % (ref 39.0–52.0)
Hemoglobin: 15.3 g/dL (ref 13.0–17.0)
MCH: 27.5 pg (ref 26.0–34.0)
MCHC: 31.4 g/dL (ref 30.0–36.0)
MCV: 87.6 fL (ref 80.0–100.0)
Platelets: 435 10*3/uL — ABNORMAL HIGH (ref 150–400)
RBC: 5.57 MIL/uL (ref 4.22–5.81)
RDW: 16.2 % — ABNORMAL HIGH (ref 11.5–15.5)
WBC: 9.9 10*3/uL (ref 4.0–10.5)
nRBC: 0 % (ref 0.0–0.2)

## 2020-12-28 LAB — BASIC METABOLIC PANEL
Anion gap: 9 (ref 5–15)
BUN: 17 mg/dL (ref 8–23)
CO2: 24 mmol/L (ref 22–32)
Calcium: 9 mg/dL (ref 8.9–10.3)
Chloride: 106 mmol/L (ref 98–111)
Creatinine, Ser: 1.36 mg/dL — ABNORMAL HIGH (ref 0.61–1.24)
GFR, Estimated: 55 mL/min — ABNORMAL LOW (ref >60)
Glucose, Bld: 126 mg/dL — ABNORMAL HIGH (ref 70–99)
Potassium: 4 mmol/L (ref 3.5–5.1)
Sodium: 139 mmol/L (ref 135–145)

## 2020-12-28 LAB — HEPARIN LEVEL (UNFRACTIONATED): Heparin Unfractionated: 0.54 IU/mL (ref 0.30–0.70)

## 2020-12-28 LAB — POCT ACTIVATED CLOTTING TIME: Activated Clotting Time: 434 seconds

## 2020-12-28 LAB — PROTIME-INR
INR: 1.1 (ref 0.8–1.2)
Prothrombin Time: 13.7 seconds (ref 11.4–15.2)

## 2020-12-28 SURGERY — CORONARY STENT INTERVENTION
Anesthesia: LOCAL

## 2020-12-28 MED ORDER — DIAZEPAM 5 MG PO TABS: 5.0000 mg | ORAL_TABLET | Freq: Four times a day (QID) | ORAL | Status: DC | PRN

## 2020-12-28 MED ORDER — NITROGLYCERIN 1 MG/10 ML FOR IR/CATH LAB
INTRA_ARTERIAL | Status: DC | PRN
  Administered 2020-12-28: 150 ug via INTRACORONARY

## 2020-12-28 MED ORDER — FENTANYL CITRATE (PF) 100 MCG/2ML IJ SOLN
INTRAMUSCULAR | Status: DC | PRN
  Administered 2020-12-28 (×2): 25 ug via INTRAVENOUS

## 2020-12-28 MED ORDER — WARFARIN - PHARMACIST DOSING INPATIENT: Freq: Every day | Status: DC

## 2020-12-28 MED ORDER — LIDOCAINE HCL (PF) 1 % IJ SOLN
INTRAMUSCULAR | Status: AC
  Filled 2020-12-28: qty 30

## 2020-12-28 MED ORDER — WARFARIN SODIUM 5 MG PO TABS
6.0000 mg | ORAL_TABLET | Freq: Once | ORAL | Status: AC
  Administered 2020-12-28: 6 mg via ORAL
  Filled 2020-12-28: qty 1

## 2020-12-28 MED ORDER — MIDAZOLAM HCL 2 MG/2ML IJ SOLN
INTRAMUSCULAR | Status: DC | PRN
  Administered 2020-12-28: 1 mg via INTRAVENOUS
  Administered 2020-12-28: 2 mg via INTRAVENOUS

## 2020-12-28 MED ORDER — HEPARIN (PORCINE) IN NACL 1000-0.9 UT/500ML-% IV SOLN
INTRAVENOUS | Status: AC
  Filled 2020-12-28: qty 1000

## 2020-12-28 MED ORDER — ENOXAPARIN SODIUM 80 MG/0.8ML ~~LOC~~ SOLN: 80.0000 mg | Freq: Two times a day (BID) | SUBCUTANEOUS | Status: DC

## 2020-12-28 MED ORDER — SODIUM CHLORIDE 0.9% FLUSH
3.0000 mL | Freq: Two times a day (BID) | INTRAVENOUS | Status: DC
  Administered 2020-12-28 – 2020-12-29 (×2): 3 mL via INTRAVENOUS

## 2020-12-28 MED ORDER — HEPARIN (PORCINE) IN NACL 1000-0.9 UT/500ML-% IV SOLN
INTRAVENOUS | Status: DC | PRN
  Administered 2020-12-28: 500 mL

## 2020-12-28 MED ORDER — MIDAZOLAM HCL 2 MG/2ML IJ SOLN
INTRAMUSCULAR | Status: AC
  Filled 2020-12-28: qty 2

## 2020-12-28 MED ORDER — BIVALIRUDIN TRIFLUOROACETATE 250 MG IV SOLR
INTRAVENOUS | Status: AC
  Filled 2020-12-28: qty 250

## 2020-12-28 MED ORDER — FENTANYL CITRATE (PF) 100 MCG/2ML IJ SOLN
INTRAMUSCULAR | Status: AC
  Filled 2020-12-28: qty 2

## 2020-12-28 MED ORDER — IOHEXOL 350 MG/ML SOLN
INTRAVENOUS | Status: DC | PRN
  Administered 2020-12-28: 100 mL

## 2020-12-28 MED ORDER — SODIUM CHLORIDE 0.9% FLUSH: 3.0000 mL | INTRAVENOUS | Status: DC | PRN

## 2020-12-28 MED ORDER — BIVALIRUDIN BOLUS VIA INFUSION - CUPID
INTRAVENOUS | Status: DC | PRN
  Administered 2020-12-28: 54.9 mg via INTRAVENOUS

## 2020-12-28 MED ORDER — SODIUM CHLORIDE 0.9 % IV SOLN
INTRAVENOUS | Status: DC | PRN
  Administered 2020-12-28: 1.75 mg/kg/h via INTRAVENOUS

## 2020-12-28 MED ORDER — LIDOCAINE HCL (PF) 1 % IJ SOLN
INTRAMUSCULAR | Status: DC | PRN
  Administered 2020-12-28: 12 mL

## 2020-12-28 MED ORDER — CLOPIDOGREL BISULFATE 75 MG PO TABS
75.0000 mg | ORAL_TABLET | Freq: Every day | ORAL | Status: DC
  Administered 2020-12-29: 75 mg via ORAL
  Filled 2020-12-28: qty 1

## 2020-12-28 MED ORDER — SODIUM CHLORIDE 0.9 % IV SOLN: 250.0000 mL | INTRAVENOUS | Status: DC | PRN

## 2020-12-28 MED ORDER — LABETALOL HCL 5 MG/ML IV SOLN: 10.0000 mg | INTRAVENOUS | Status: AC | PRN

## 2020-12-28 MED ORDER — CLOPIDOGREL BISULFATE 75 MG PO TABS
600.0000 mg | ORAL_TABLET | Freq: Once | ORAL | Status: AC
  Administered 2020-12-28: 600 mg via ORAL
  Filled 2020-12-28: qty 8

## 2020-12-28 MED ORDER — HYDRALAZINE HCL 20 MG/ML IJ SOLN: 10.0000 mg | INTRAMUSCULAR | Status: AC | PRN

## 2020-12-28 MED ORDER — SODIUM CHLORIDE 0.9 % WEIGHT BASED INFUSION
1.0000 mL/kg/h | INTRAVENOUS | Status: AC
  Administered 2020-12-28: 1 mL/kg/h via INTRAVENOUS

## 2020-12-28 SURGICAL SUPPLY — 20 items
BALLN SAPPHIRE 2.5X15 (BALLOONS) ×2
BALLN SAPPHIRE ~~LOC~~ 3.5X12 (BALLOONS) ×2 IMPLANT
BALLOON SAPPHIRE 2.5X15 (BALLOONS) ×1 IMPLANT
CATH INFINITI JR4 5F (CATHETERS) ×2 IMPLANT
CATH VISTA GUIDE 6FR XBLAD3.5 (CATHETERS) ×2 IMPLANT
CATH VISTA GUIDE 6FR XBLAD4 (CATHETERS) ×2 IMPLANT
CLOSURE PERCLOSE PROSTYLE (VASCULAR PRODUCTS) ×2 IMPLANT
ELECT DEFIB PAD ADLT CADENCE (PAD) ×2 IMPLANT
KIT ENCORE 26 ADVANTAGE (KITS) ×2 IMPLANT
KIT HEART LEFT (KITS) ×2 IMPLANT
KIT MICROPUNCTURE NIT STIFF (SHEATH) ×2 IMPLANT
PACK CARDIAC CATHETERIZATION (CUSTOM PROCEDURE TRAY) ×2 IMPLANT
SHEATH PINNACLE 6F 10CM (SHEATH) ×2 IMPLANT
SHEATH PROBE COVER 6X72 (BAG) ×2 IMPLANT
STENT RESOLUTE ONYX 3.0X18 (Permanent Stent) ×2 IMPLANT
TRANSDUCER W/STOPCOCK (MISCELLANEOUS) ×2 IMPLANT
TUBING CIL FLEX 10 FLL-RA (TUBING) ×2 IMPLANT
WIRE ASAHI FIELDER XT 190CM (WIRE) ×2 IMPLANT
WIRE EMERALD 3MM-J .035X150CM (WIRE) ×2 IMPLANT
WIRE HI TORQ WHISPER MS 300CM (WIRE) ×2 IMPLANT

## 2020-12-28 NOTE — TOC Benefit Eligibility Note (Signed)
Patient Teacher, English as a foreign language completed.    The patient is currently admitted and upon discharge could be taking Enoxaprin 80 mg/0.8 ml.  The current 5 day co-pay is, $102.06 due to a $95.00 deductible.   The patient is insured through [place] [place] Patient Advocate Specialist [place] Team Direct Number: [phone]  Fax: [phone]

## 2020-12-28 NOTE — Plan of Care (Signed)
  Problem: Education: Goal: Understanding of CV disease, CV risk reduction, and recovery process will improve Outcome: Progressing Goal: Individualized Educational Video(s) Outcome: Progressing   Problem: Activity: Goal: Ability to return to baseline activity level will improve Outcome: Progressing   Problem: Cardiovascular: Goal: Ability to achieve and maintain adequate cardiovascular perfusion will improve Outcome: Progressing Goal: Vascular access site(s) Level 0-1 will be maintained Outcome: Progressing   Problem: Health Behavior/Discharge Planning: Goal: Ability to safely manage health-related needs after discharge will improve Outcome: Progressing   Problem: Education: Goal: Knowledge of General Education information will improve Description: Including pain rating scale, medication(s)/side effects and non-pharmacologic comfort measures Outcome: Progressing   Problem: Health Behavior/Discharge Planning: Goal: Ability to manage health-related needs will improve Outcome: Progressing   Problem: Clinical Measurements: Goal: Ability to maintain clinical measurements within normal limits will improve Outcome: Progressing Goal: Will remain free from infection Outcome: Progressing Goal: Diagnostic test results will improve Outcome: Progressing Goal: Respiratory complications will improve Outcome: Progressing Goal: Cardiovascular complication will be avoided Outcome: Progressing   Problem: Activity: Goal: Risk for activity intolerance will decrease Outcome: Progressing   Problem: Nutrition: Goal: Adequate nutrition will be maintained Outcome: Progressing   Problem: Coping: Goal: Level of anxiety will decrease Outcome: Progressing   Problem: Elimination: Goal: Will not experience complications related to bowel motility Outcome: Progressing Goal: Will not experience complications related to urinary retention Outcome: Progressing   Problem: Pain Managment: Goal:  General experience of comfort will improve Outcome: Progressing   Problem: Safety: Goal: Ability to remain free from injury will improve Outcome: Progressing   Problem: Skin Integrity: Goal: Risk for impaired skin integrity will decrease Outcome: Progressing   Problem: Education: Goal: Knowledge of General Education information will improve Description: Including pain rating scale, medication(s)/side effects and non-pharmacologic comfort measures Outcome: Progressing   Problem: Health Behavior/Discharge Planning: Goal: Ability to manage health-related needs will improve Outcome: Progressing   Problem: Clinical Measurements: Goal: Ability to maintain clinical measurements within normal limits will improve Outcome: Progressing Goal: Will remain free from infection Outcome: Progressing Goal: Diagnostic test results will improve Outcome: Progressing Goal: Respiratory complications will improve Outcome: Progressing Goal: Cardiovascular complication will be avoided Outcome: Progressing   Problem: Activity: Goal: Risk for activity intolerance will decrease Outcome: Progressing   Problem: Nutrition: Goal: Adequate nutrition will be maintained Outcome: Progressing   Problem: Coping: Goal: Level of anxiety will decrease Outcome: Progressing   Problem: Elimination: Goal: Will not experience complications related to bowel motility Outcome: Progressing Goal: Will not experience complications related to urinary retention Outcome: Progressing   Problem: Pain Managment: Goal: General experience of comfort will improve Outcome: Progressing   Problem: Safety: Goal: Ability to remain free from injury will improve Outcome: Progressing   Problem: Skin Integrity: Goal: Risk for impaired skin integrity will decrease Outcome: Progressing

## 2020-12-28 NOTE — Interval H&P Note (Signed)
Cath Lab Visit (complete for each Cath Lab visit)  Clinical Evaluation Leading to the Procedure:   ACS: Yes.    Non-ACS:    Anginal Classification: CCS IV  Anti-ischemic medical therapy: Minimal Therapy (1 class of medications)  Non-Invasive Test Results: No non-invasive testing performed  Prior CABG: No previous CABG      History and Physical Interval Note:  12/28/2020 9:21 AM  Phillip [name]  has presented today for surgery, with the diagnosis of chest pain.  The various methods of treatment have been discussed with the patient and family. After consideration of risks, benefits and other options for treatment, the patient has consented to  Procedure(s): CORONARY STENT INTERVENTION (N/A) as a surgical intervention.  The patient's history has been reviewed, patient examined, no change in status, stable for surgery.  I have reviewed the patient's chart and labs.  Questions were answered to the patient's satisfaction.    Met with patient and wife at bedside before procedure. Now loaded with plavix 600 mg. Reviewed films. Complex proximal LAD stenosis, with plans for PCI today. Reviewed risks, indications, alternatives of the PTCA/stent procedure. All questions answered. Plan PTCA/stent proximal LAD from right femoral access.   Sherren Mocha

## 2020-12-28 NOTE — Progress Notes (Signed)
Progress Note  Patient Name: Phillip [name] Date of Encounter: 12/28/2020  Primary Cardiologist: Yolonda Kida, MD   Subjective   PCI done this AM.  CP free.  No complications.  Patient is hungry but CP free.    Inpatient Medications    Scheduled Meds: . amLODipine  5 mg Oral Daily  . aspirin EC  81 mg Oral Daily  . atorvastatin  80 mg Oral Daily  . [START ON 12/29/2020] clopidogrel  75 mg Oral Q breakfast  . levothyroxine  50 mcg Oral Q0600  . loratadine  10 mg Oral Daily  . melatonin  5 mg Oral QHS  . metoprolol tartrate  12.5 mg Oral BID  . multivitamin with minerals  1 tablet Oral Daily  . pantoprazole  40 mg Oral Daily  . sodium chloride flush  3 mL Intravenous Q12H  . sodium chloride flush  3 mL Intravenous Q12H  . sodium chloride flush  3 mL Intravenous Q12H   Continuous Infusions: . sodium chloride    . sodium chloride    . sodium chloride     PRN Meds: sodium chloride, sodium chloride, acetaminophen, bisacodyl, diazepam, hydrALAZINE, labetalol, nitroGLYCERIN, ondansetron (ZOFRAN) IV, sodium chloride flush, sodium chloride flush   Vital Signs    Vitals:   12/28/20 1036 12/28/20 1100 12/28/20 1123 12/28/20 1127  BP: 133/80 114/70  131/73  Pulse: (!) 56 (!) 55  (!) 57  Resp: 11   16  Temp:   97.7 F (36.5 C) 97.7 F (36.5 C)  TempSrc:   Oral Oral  SpO2: 100% 99%    Weight:    73.2 kg  Height:    5\' 9"  (1.753 m)    Intake/Output Summary (Last 24 hours) at 12/28/2020 1155 Last data filed at 12/28/2020 1151 Gross per 24 hour  Intake 521 ml  Output 1750 ml  Net -1229 ml   Filed Weights   12/27/20 0500 12/28/20 0531 12/28/20 1127  Weight: 74.5 kg 73.2 kg 73.2 kg    Telemetry    Sinus Bradycardia - Personally Reviewed  ECG    Sinus Bradycardia with 1st HB and baseline artifact- Personally Reviewed  Physical Exam   GEN: No acute distress.   Neck: No JVD Cardiac: Regular bradycardia, no murmurs, rubs, or gallops.  No R femoral bruit or  hematomya Respiratory: Clear to auscultation bilaterally. GI: Soft, nontender, non-distended  MS: No edema; No deformity. Neuro:  Nonfocal  Psych: Normal affect   Labs    Chemistry Recent Labs  Lab 12/26/20 0120 12/27/20 1123 12/28/20 0126  NA 137 138 139  K 4.0 4.3 4.0  CL 108 108 106  CO2 23 21* 24  GLUCOSE 108* 95 126*  BUN 15 15 17   CREATININE 1.37* 1.40* 1.36*  CALCIUM 8.9 8.9 9.0  GFRNONAA 54* 53* 55*  ANIONGAP 6 9 9      Hematology Recent Labs  Lab 12/26/20 0120 12/27/20 0056 12/28/20 0126  WBC 10.9* 10.3 9.9  RBC 5.37 5.21 5.57  HGB 15.0 14.3 15.3  HCT 47.0 45.5 48.8  MCV 87.5 87.3 87.6  MCH 27.9 27.4 27.5  MCHC 31.9 31.4 31.4  RDW 15.9* 15.9* 16.2*  PLT 361 397 435*    Cardiac EnzymesNo results for input(s): TROPONINI in the last 168 hours. No results for input(s): TROPIPOC in the last 168 hours.   BNPNo results for input(s): BNP, PROBNP in the last 168 hours.   DDimer No results for input(s): DDIMER in the last 168  hours.   Radiology    CARDIAC CATHETERIZATION  Result Date: 12/28/2020 1.  Successful PCI of the proximal LAD using a 3.0 x 18 mm resolute Onyx DES 2.  Total occlusion of the native RCA with collateral vessel supplying the mid and distal RCA and codominant PDA branch Recommend: Resume warfarin today.  Continue aspirin x30 days.  Continue clopidogrel x12 months if tolerated.  Patient will revisit warfarin versus a DOAC drug with his primary care physician, Dr. Sabra Heck.  Patient to follow-up with Dr. Clayborn Bigness.  He should be eligible for hospital discharge tomorrow morning.   Cardiac Studies   Transthoracic Echocardiogram: Date: 12/25/20 Results: Mild AI 1. Left ventricular ejection fraction, by estimation, is 50 to 55%. The  left ventricle has low normal function. The left ventricle has no regional  wall motion abnormalities. There is mild left ventricular hypertrophy.  Left ventricular diastolic  parameters are consistent with Grade I  diastolic dysfunction (impaired  relaxation).  2. Right ventricular systolic function is normal. The right ventricular  size is normal. Tricuspid regurgitation signal is inadequate for assessing  PA pressure.  3. Left atrial size was moderately dilated.  4. The mitral valve is normal in structure. Trivial mitral valve  regurgitation.  5. The aortic valve is tricuspid. Aortic valve regurgitation is mild.  Mild aortic valve sclerosis is present, with no evidence of aortic valve  stenosis.  6. The inferior vena cava is normal in size with greater than 50%  respiratory variability, suggesting right atrial pressure of 3 mmHg.   Left/Right Heart Catheterizations: Date: 12/28/20 Results: : 1.  Successful PCI of the proximal LAD using a 3.0 x 18 mm resolute Onyx DES 2.  Total occlusion of the native RCA with collateral vessel supplying the mid and distal RCA and codominant PDA branch  Recommend: Resume warfarin today.  Continue aspirin x30 days.  Continue clopidogrel x12 months if tolerated.  Patient will revisit warfarin versus a DOAC drug with his primary care physician, Dr. Sabra Heck.  Patient to follow-up with Dr. Clayborn Bigness.  He should be eligible for hospital discharge tomorrow morning.   Patient Profile     75 y.o. male with a history of HTN, HLD, CKD Stage IIIb, DVT on coumadin with financial barriers to eliquis in the past, with hypothyroidism who presented as a transfer NSTEMI for pLAD PCAI  Assessment & Plan    NSTEMI HTN HLD DVT CKD Stage IIIb - s/p pLAD PCI - planned for ASA for 30 days, plavix for one year, and warfarin with lovenox bridge for DVT (discussed with multiple PharmD colleagues about options) - continue PPI on triple therapy - continuing norvasc and atorvastatin 80 mg PO Daily - tolerate bradycardia on tartrate 12.5 mg PO BID without issues [id number] appears to be financially viable with copay < $50  Hypothyroidism- continued levothyroxine  Possible DC  12/29/20 with PCP and Cardiology follow up needed.  Discussed at length with patient, wife, nursing, and PharmD Team  For questions or updates, please contact [facility] Please consult [email or web address] for contact info under Cardiology/STEMI.      Signed, Werner Lean, MD  12/28/2020, 11:55 AM

## 2020-12-28 NOTE — TOC Benefit Eligibility Note (Signed)
Patient Teacher, English as a foreign language completed.    The patient is currently admitted and upon discharge could be taking Xarelto 15 mg.  The current 30 day co-pay is, $142.00 because of a $95.00 deductible after deductible is met the co-pay will be $47.00 .   The patient is currently admitted and upon discharge could be taking Eliquis 5 mg.  The current 30 day co-pay is, $142.00 because of a $95.00 deductible after deductible is met the co-pay will be $47.00 .   The patient is insured through [employer] [place] Patient Advocate Specialist [place] Team Direct Number: [phone]  Fax: [phone]

## 2020-12-28 NOTE — Consult Note (Addendum)
[place] for enoxaparin, warfarin Indication: chest pain/ACS, h/o PE/DVT   Patient Measurements: Heparin Dosing Weight: 75.3kg  Labs: Recent Labs    12/26/20 0120 12/27/20 0056 12/27/20 1123 12/28/20 0126  HGB 15.0 14.3  --  15.3  HCT 47.0 45.5  --  48.8  PLT 361 397  --  435*  LABPROT  --   --   --  13.7  INR  --   --   --  1.1  HEPARINUNFRC 0.37 0.55  --  0.54  CREATININE 1.37*  --  1.40* 1.36*    Estimated Creatinine Clearance: 47.7 mL/min (A) (by C-G formula based on SCr of 1.36 mg/dL (H)).   Medical History: Past Medical History:  Diagnosis Date  . Arthritis   . Basal cell carcinoma    L clavicle, L prox forearm- removed years ago   . Bladder cancer ([place])   . Bladder neck contracture   . CAD (coronary artery disease)    a. 12/2020 NSTEMI/Cath: LM nl, LAD 95ost/p, LCX 50p, RCA nl. EF 45-50%.  . Cataract   . CKD (chronic kidney disease), stage III ([place])   . ED (erectile dysfunction)   . Frequency   . GERD (gastroesophageal reflux disease)   . 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 Following LE DVT-->Chronic warfarin.  Marland Kitchen Hyperlipidemia LDL goal <70   . Hypertension   . Hypothyroidism   . Incontinence of urine    sui, s/p cryoablation  . Ischemic cardiomyopathy    a. 11/2018 Echo: EF 60-65%; b. 12/2020 LV Gram: EF 45-50% in setting of NSTEMI.  Marland Kitchen Kidney stones   . Neuropathy    feet  . Nocturia   . Prostate cancer ([place])    S/P   CRYOABLATION  . Right elbow tendinitis   . Right Lower Extremity DVT (deep venous thrombosis) ([place]) 11/25/2018  . Vertigo    1-2x/yr  . Wears glasses     Assessment: 75 yo male with h/o bladder cancer (s/p cryoablation TURP), HTN, NIDDM, PE (provoked postop) who presents with substernal chest discomfort.  On chronic warfarin for DVT/PE; INR on admit was 1.7.   He is s/p cath with DES to LAD. Pharmacy to resume warfarin  And begin lovenox. He does not know his home warfarin dose- he  states that he takes 1/2 tablet on MTh (pharmacy records indicate that he is taking a 4mg  tablet)   Goal of Therapy:  Heparin level 0.3-0.7 units/ml Monitor platelets by anticoagulation protocol: Yes   Plan:  -Warfarin 6mg  po today -Lovenox 80mg  [place] q12h, first dose tonight (dose rounded up for ease of administration- anticipate discharge 4/12 on lovenox) -Daily PT/INR   Hildred Laser, PharmD Clinical Pharmacist **Pharmacist phone directory can now be found on [email or web address] ([email or web address]).  Listed under [place].  Addendum -Patient expressed concerns about lovenox. He has not been on this before. Of note he has had recent procedures (cataract extraction) where coumadin was held but it appears he was not bridged -Spoke with Dr. Julieanne Manson- Will start warfarin today. No lovenox bridge  Plan -Continue warfarin -d/c lovenox  Hildred Laser, PharmD Clinical Pharmacist **Pharmacist phone directory can now be found on [email or web address] ([email or web address]).  Listed under [place].

## 2020-12-28 NOTE — Consult Note (Signed)
[place] for Heparin Indication: chest pain/ACS, h/o PE/DVT   Patient Measurements: Heparin Dosing Weight: 75.3kg  Labs: Recent Labs    12/26/20 0120 12/27/20 0056 12/27/20 1123 12/28/20 0126  HGB 15.0 14.3  --  15.3  HCT 47.0 45.5  --  48.8  PLT 361 397  --  435*  LABPROT  --   --   --  13.7  INR  --   --   --  1.1  HEPARINUNFRC 0.37 0.55  --  0.54  CREATININE 1.37*  --  1.40* 1.36*    Estimated Creatinine Clearance: 47.7 mL/min (A) (by C-G formula based on SCr of 1.36 mg/dL (H)).   Medical History: Past Medical History:  Diagnosis Date  . Arthritis   . Basal cell carcinoma    L clavicle, L prox forearm- removed years ago   . Bladder cancer ([place])   . Bladder neck contracture   . CAD (coronary artery disease)    a. 12/2020 NSTEMI/Cath: LM nl, LAD 95ost/p, LCX 50p, RCA nl. EF 45-50%.  . Cataract   . CKD (chronic kidney disease), stage III ([place])   . ED (erectile dysfunction)   . Frequency   . GERD (gastroesophageal reflux disease)   . 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 Following LE DVT-->Chronic warfarin.  Marland Kitchen Hyperlipidemia LDL goal <70   . Hypertension   . Hypothyroidism   . Incontinence of urine    sui, s/p cryoablation  . Ischemic cardiomyopathy    a. 11/2018 Echo: EF 60-65%; b. 12/2020 LV Gram: EF 45-50% in setting of NSTEMI.  Marland Kitchen Kidney stones   . Neuropathy    feet  . Nocturia   . Prostate cancer ([place])    S/P   CRYOABLATION  . Right elbow tendinitis   . Right Lower Extremity DVT (deep venous thrombosis) ([place]) 11/25/2018  . Vertigo    1-2x/yr  . Wears glasses     Assessment: 75 yo male with h/o bladder cancer (s/p cryoablation TURP), HTN, NIDDM, PE (provoked postop) who presents with substernal chest discomfort. Pharmacy consulted to start heparin for ACS/NSTEMI. Trops elevated [phone]. On chronic warfarin for DVT/PE; INR on admit was 1.7.   Heparin level this AM is therapeutic at 0.54. CBC stable, no IV  or bleeding issues noted. Plan is to continue IV heparin with PCI on Monday 4/11.   Goal of Therapy:  Heparin level 0.3-0.7 units/ml Monitor platelets by anticoagulation protocol: Yes   Plan:  Continue IV heparin at 1,450 units/hr Monitor daily HL, CBC, s/sx bleeding Follow up plans post cath and for DOAC at discharge    Thank you for allowing [place] to participate in this patients care. Jens Som, PharmD 12/28/2020 8:29 AM  Please check [email or web address] for unit-specific pharmacy phone numbers.

## 2020-12-29 ENCOUNTER — Other Ambulatory Visit (HOSPITAL_COMMUNITY): Payer: Self-pay

## 2020-12-29 LAB — BASIC METABOLIC PANEL
Anion gap: 9 (ref 5–15)
BUN: 16 mg/dL (ref 8–23)
CO2: 22 mmol/L (ref 22–32)
Calcium: 8.9 mg/dL (ref 8.9–10.3)
Chloride: 107 mmol/L (ref 98–111)
Creatinine, Ser: 1.35 mg/dL — ABNORMAL HIGH (ref 0.61–1.24)
GFR, Estimated: 55 mL/min — ABNORMAL LOW (ref >60)
Glucose, Bld: 84 mg/dL (ref 70–99)
Potassium: 4 mmol/L (ref 3.5–5.1)
Sodium: 138 mmol/L (ref 135–145)

## 2020-12-29 LAB — PROTIME-INR
INR: 1.2 (ref 0.8–1.2)
Prothrombin Time: 14.6 seconds (ref 11.4–15.2)

## 2020-12-29 LAB — CBC
HCT: 48.4 % (ref 39.0–52.0)
Hemoglobin: 15 g/dL (ref 13.0–17.0)
MCH: 27.7 pg (ref 26.0–34.0)
MCHC: 31 g/dL (ref 30.0–36.0)
MCV: 89.5 fL (ref 80.0–100.0)
Platelets: 417 10*3/uL — ABNORMAL HIGH (ref 150–400)
RBC: 5.41 MIL/uL (ref 4.22–5.81)
RDW: 16.3 % — ABNORMAL HIGH (ref 11.5–15.5)
WBC: 10.7 10*3/uL — ABNORMAL HIGH (ref 4.0–10.5)
nRBC: 0 % (ref 0.0–0.2)

## 2020-12-29 MED ORDER — ATORVASTATIN CALCIUM 80 MG PO TABS
80.0000 mg | ORAL_TABLET | Freq: Every day | ORAL | 3 refills | Status: DC
  Filled 2020-12-29: qty 90, 90d supply, fill #0

## 2020-12-29 MED ORDER — WARFARIN SODIUM 4 MG PO TABS: ORAL_TABLET | ORAL | 11 refills | Status: DC

## 2020-12-29 MED ORDER — NITROGLYCERIN 0.4 MG SL SUBL
0.4000 mg | SUBLINGUAL_TABLET | SUBLINGUAL | 3 refills | Status: AC | PRN
  Filled 2020-12-29: qty 25, 7d supply, fill #0

## 2020-12-29 MED ORDER — ASPIRIN 81 MG PO TBEC
81.0000 mg | DELAYED_RELEASE_TABLET | Freq: Every day | ORAL | 0 refills | Status: DC
  Filled 2020-12-29: qty 30, 30d supply, fill #0

## 2020-12-29 MED ORDER — CLOPIDOGREL BISULFATE 75 MG PO TABS
75.0000 mg | ORAL_TABLET | Freq: Every day | ORAL | 3 refills | Status: AC
  Filled 2020-12-29: qty 90, 90d supply, fill #0

## 2020-12-29 MED ORDER — ENOXAPARIN SODIUM 80 MG/0.8ML ~~LOC~~ SOLN
80.0000 mg | Freq: Once | SUBCUTANEOUS | Status: AC
  Administered 2020-12-29: 80 mg via SUBCUTANEOUS
  Filled 2020-12-29: qty 0.8

## 2020-12-29 MED ORDER — ENOXAPARIN SODIUM 80 MG/0.8ML ~~LOC~~ SOLN
80.0000 mg | Freq: Two times a day (BID) | SUBCUTANEOUS | 0 refills | Status: DC
  Filled 2020-12-29: qty 8, 5d supply, fill #0

## 2020-12-29 MED ORDER — PANTOPRAZOLE SODIUM 40 MG PO TBEC
40.0000 mg | DELAYED_RELEASE_TABLET | Freq: Every day | ORAL | 3 refills | Status: AC
  Filled 2020-12-29: qty 90, 90d supply, fill #0

## 2020-12-29 NOTE — Plan of Care (Signed)

## 2020-12-29 NOTE — Discharge Summary (Addendum)
Discharge Summary    Patient ID: Phillip [name] MRN: [id number]; DOB: [birth year]  Admit date: 12/25/2020 Discharge date: 12/29/2020  PCP:  Rusty Aus, MD   [place]  Cardiologist:  Yolonda Kida, MD  Advanced Practice Provider:  No care team member to display Electrophysiologist:  None    Discharge Diagnoses    Principal Problem:   NSTEMI (non-ST elevated myocardial infarction) [facility]) Active Problems:   HTN (hypertension)   Prostate cancer ([place])   Bladder cancer ([place])   Acute deep vein thrombosis (DVT) of calf muscle vein of right lower extremity ([place])   Hyperlipidemia LDL goal <70   CKD (chronic kidney disease), stage III ([place])   CAD (coronary artery disease)    Diagnostic Studies/Procedures    Left heart catheterization 12/24/20:  Ost LAD to Prox LAD lesion is 95% stenosed. Calcification moderate  Prox Cx lesion is 50% stenosed.  Unable to successfully cannulate and engaged right coronary artery which we think is nondominant  Mildly reduced borderline left ventricular function with anterior apical hypokinesis ejection fraction around 45 to 50%   Conclusion Inpatient diagnostic cardiac cath right radial approach.   LV function borderline to mildly depressed EF around 45 to 50% with anterior apical hypokinesis Coronaries Left main large relatively free of disease LAD was large with a complex calcified proximal lesion 95% Circumflex moderate disease but large dominant system Right coronary artery was not successfully cannulated but appears to be nondominant and selective shots Complications Patient is being transferred to [facility] for possible complex intervention of proximal LAD versus CABG Patient's been transferred to [facility]   Echo 12/25/20: 1. Left ventricular ejection fraction, by estimation, is 50 to 55%. The  left ventricle has low normal function. The left ventricle has no regional  wall motion abnormalities.  There is mild left ventricular hypertrophy.  Left ventricular diastolic  parameters are consistent with Grade I diastolic dysfunction (impaired  relaxation).  2. Right ventricular systolic function is normal. The right ventricular  size is normal. Tricuspid regurgitation signal is inadequate for assessing  PA pressure.  3. Left atrial size was moderately dilated.  4. The mitral valve is normal in structure. Trivial mitral valve  regurgitation.  5. The aortic valve is tricuspid. Aortic valve regurgitation is mild.  Mild aortic valve sclerosis is present, with no evidence of aortic valve  stenosis.  6. The inferior vena cava is normal in size with greater than 50%  respiratory variability, suggesting right atrial pressure of 3 mmHg.   Coronary stent intervention 12/28/20:  Ost LAD to Prox LAD lesion is 95% stenosed. Calcification moderate  Prox Cx lesion is 50% stenosed.  Unable to successfully cannulate and engaged right coronary artery which we think is nondominant  Mildly reduced borderline left ventricular function with anterior apical hypokinesis ejection fraction around 45 to 50%   Conclusion Inpatient diagnostic cardiac cath right radial approach.   LV function borderline to mildly depressed EF around 45 to 50% with anterior apical hypokinesis Coronaries Left main large relatively free of disease LAD was large with a complex calcified proximal lesion 95% Circumflex moderate disease but large dominant system Right coronary artery was not successfully cannulated but appears to be nondominant and selective shots Complications Patient is being transferred to [facility] for possible complex intervention of proximal LAD versus CABG Patient's been transferred to [facility]   _____________   History of Present Illness     DEMONT [name] is a 75  y.o. 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 He initially presented to [facility] 12/23/20 with complaints of chest pressure while shoveling dirt. EKG was non-ischemic. Found to have elevated HsTrop c/f NSTEMI which peaked at 668. He was evaluated by Dr. Clayborn Bigness, started on heparin and sent for [facility] 12/24/20. He was found to have 95% ostial/proximal LAD stenosis, 50% pLCx stenosis, and unable to visualize RCA. Decision made to transfer to [facility] for possible complex PCI to LAD vs CABG.   Hospital Course     Consultants: None   1. NSTEMI: patient initially presented to [facility] with chest pressure, found to have HsTrop peak of 668. He underwent LHC which showed 95% ostial/proximal LAD stenosis, 50% pLCx stenosis, and unable to visualize RCA. He was transferred to [facility] for consideration of possible complex PCI to LAD vs CABG. Echo this admission showed EF 50-55%, no RWMA, mild LVH, G1DD, moderate LAE, and mild AI. He was taken back to the cath lab 12/28/20 for atherectomy of LAD. He underwent successful PCI/DES to pLAD with additional findings of CTO of RCA with collateral vessel supplying m-dRCA stenosis and codominant PDA branch. He was recommended to continue aspirin x3- days and plavix x12 months uninterrupted in addition to previously prescribed coumadin. Right groin cath site with no active bleeding, hematoma, or ecchymosis - Continue aspirin x30 days and plavix x12 months - Continue atorvastatin - Continue BBlocker  2. HTN: BP generally stable.  - Continue amlodipine and metoprolol  3. HLD: LDL 119 this admission. Home atorvastatin increased to 80mg  daily - Continue atorvastatin - Will need repeat FLP/LFTs in 6-8 weeks.   4. History of PE/DVT: coumadin held in anticipation of LHC this admission. He was maintained on heparin gtt in the interim. Coumadin restarted 12/28/20. Decision made to bridge with lovenox to therapeutic INR. Pharmacy assisted with dosing/education.  - Continue lovenox 80mg  BID x5 days for bridge to  therapeutic coumadin - Home coumadin 4mg  daily with the exception of 2mg  on Monday/Thursdays resumed at discharge - Patient instructed to scheduled follow-up with PCP later this week for INR check  5. GERD: patient transitioned from omeprazole to pantoprazole to avoid interaction with plavix - Continue pantoprazole  6. History of bladder/prostate cancer: Follows with urology outpatient. Has been on testosterone injections. Risks of progressive cardiovascular disease and testosterone use discussed.  - Will hold testosterone at discharge - Encouraged follow-up with urology to discuss alternative treatment options going forward  7. CKD stage 3: Cr stable at 1.35 following LHC which is his baseline.  - Continue to monitor routinely outpatient   Did the patient have an acute coronary syndrome (MI, NSTEMI, STEMI, etc) this admission?:  Yes                               AHA/ACC Clinical Performance & Quality Measures: 1. Aspirin prescribed? - Yes 2. ADP Receptor Inhibitor (Plavix/Clopidogrel, Brilinta/Ticagrelor or Effient/Prasugrel) prescribed (includes medically managed patients)? - Yes 3. Beta Blocker prescribed? - Yes 4. High Intensity Statin (Lipitor 40-80mg  or Crestor 20-40mg ) prescribed? - Yes 5. EF assessed during THIS hospitalization? - Yes 6. For EF <40%, was ACEI/ARB prescribed? - Not Applicable (EF >/= 10%) 7. For EF <40%, Aldosterone Antagonist (Spironolactone or Eplerenone) prescribed? - Not Applicable (EF >/= 25%) 8. Cardiac Rehab Phase II ordered (including medically managed patients)? - Yes  _____________  Discharge Vitals Blood pressure 122/75, pulse 63, temperature 98.4 F (36.9 C), temperature source Oral, resp. rate 17, height 5\' 9"  (1.753 m), weight 74.7 kg, SpO2 96 %.  Filed Weights   12/28/20 0531 12/28/20 1127 12/29/20 0408  Weight: 73.2 kg 73.2 kg 74.7 kg    GEN: No acute distress.   Neck: No JVD, no carotid bruits Cardiac: RRR, no murmurs, rubs,  or gallops. Right groin/radial sites without significant ecchymosis or hematoma Respiratory: Clear to auscultation bilaterally, no wheezes/ rales/ rhonchi GI: NABS, Soft, nontender, non-distended  MS: No edema; No deformity. Neuro:  Nonfocal, moving all extremities spontaneously Psych: Normal affect   Labs & Radiologic Studies    CBC Recent Labs    12/28/20 0126 12/29/20 0351  WBC 9.9 10.7*  HGB 15.3 15.0  HCT 48.8 48.4  MCV 87.6 89.5  PLT 435* 425*   Basic Metabolic Panel Recent Labs    12/28/20 0126 12/29/20 0351  NA 139 138  K 4.0 4.0  CL 106 107  CO2 24 22  GLUCOSE 126* 84  BUN 17 16  CREATININE 1.36* 1.35*  CALCIUM 9.0 8.9   Liver Function Tests No results for input(s): AST, ALT, ALKPHOS, BILITOT, PROT, ALBUMIN in the last 72 hours. No results for input(s): LIPASE, AMYLASE in the last 72 hours. High Sensitivity Troponin:   Recent Labs  Lab 12/23/20 1200 12/23/20 1340 12/24/20 0711 12/24/20 1036  TROPONINIHS 192* 637* 668* 543*    BNP Invalid input(s): POCBNP D-Dimer No results for input(s): DDIMER in the last 72 hours. Hemoglobin A1C No results for input(s): HGBA1C in the last 72 hours. Fasting Lipid Panel No results for input(s): CHOL, HDL, LDLCALC, TRIG, CHOLHDL, LDLDIRECT in the last 72 hours. Thyroid Function Tests No results for input(s): TSH, T4TOTAL, T3FREE, THYROIDAB in the last 72 hours.  Invalid input(s): FREET3 _____________  DG Chest 2 View  Result Date: 12/23/2020 CLINICAL DATA:  Chest pain EXAM: CHEST - 2 VIEW COMPARISON:  None. FINDINGS: The heart size and mediastinal contours are within normal limits. Both lungs are clear. The visualized skeletal structures are unremarkable. IMPRESSION: No active cardiopulmonary disease. Electronically Signed   By: Franchot Gallo M.D.   On: 12/23/2020 12:54   CT Angio Chest PE W/Cm &/Or Wo Cm  Result Date: 12/23/2020 CLINICAL DATA:  Suspected pulmonary embolism, high probability in a 75 year old  male. EXAM: CT ANGIOGRAPHY CHEST WITH CONTRAST TECHNIQUE: Multidetector CT imaging of the chest was performed using the standard protocol during bolus administration of intravenous contrast. Multiplanar CT image reconstructions and MIPs were obtained to evaluate the vascular anatomy. CONTRAST:  28mL OMNIPAQUE IOHEXOL 350 MG/ML SOLN COMPARISON:  December 07, 2018 FINDINGS: Cardiovascular: Mild cardiac enlargement. No pericardial effusion. Three-vessel coronary artery calcification. Mild-to-moderate generalized aortic atherosclerosis without aneurysm. Normal caliber central pulmonary vessels. Negative for acute pulmonary embolism. Mediastinum/Nodes: Thoracic inlet structures are normal. No axillary lymphadenopathy. No mediastinal lymphadenopathy. No hilar lymphadenopathy. Esophagus grossly normal. Lungs/Pleura: Basilar atelectasis. Mild air trapping. No consolidation. No pleural effusion. Upper Abdomen: Low-density splenic lesions are similar to the study ofm July of 2020. 1.3 cm lesion with low-density in the spleen at the periphery measuring water density on image 80 of series 4 at 13 mm as compared to 12 mm on the previous study. Smaller lesion seen inferior to this may have enlarged slightly but also measures water density. Imaged portions of the liver, gallbladder, pancreas and adrenal glands are normal. Musculoskeletal: No acute bone finding. No destructive bone process. Spinal degenerative  changes. Review of the MIP images confirms the above findings. IMPRESSION: 1. Negative for acute pulmonary embolism. 2. Atelectasis and mild air trapping, could reflect small airways disease. 3. Mild-to-moderate generalized aortic atherosclerosis without aneurysm. Three-vessel coronary artery calcification. 4. Low-density splenic lesions are similar to the study of July of 2020. One of these may have enlarged slightly but also measures water density. Findings are likely small cysts. 5. Aortic atherosclerosis. Aortic  Atherosclerosis (ICD10-I70.0). Electronically Signed   By: Zetta Bills M.D.   On: 12/23/2020 15:06   CARDIAC CATHETERIZATION  Result Date: 12/28/2020 1.  Successful PCI of the proximal LAD using a 3.0 x 18 mm resolute Onyx DES 2.  Total occlusion of the native RCA with collateral vessel supplying the mid and distal RCA and codominant PDA branch Recommend: Resume warfarin today.  Continue aspirin x30 days.  Continue clopidogrel x12 months if tolerated.  Patient will revisit warfarin versus a DOAC drug with his primary care physician, Dr. Sabra Heck.  Patient to follow-up with Dr. Clayborn Bigness.  He should be eligible for hospital discharge tomorrow morning.  CARDIAC CATHETERIZATION  Result Date: 12/25/2020  Ost LAD to Prox LAD lesion is 95% stenosed. Calcification moderate  Prox Cx lesion is 50% stenosed.  Unable to successfully cannulate and engaged right coronary artery which we think is nondominant  Mildly reduced borderline left ventricular function with anterior apical hypokinesis ejection fraction around 45 to 50%  Conclusion Inpatient diagnostic cardiac cath right radial approach.  LV function borderline to mildly depressed EF around 45 to 50% with anterior apical hypokinesis Coronaries Left main large relatively free of disease LAD was large with a complex calcified proximal lesion 95% Circumflex moderate disease but large dominant system Right coronary artery was not successfully cannulated but appears to be nondominant and selective shots Complications Patient is being transferred to [facility] for possible complex intervention of proximal LAD versus CABG Patient's been transferred to [facility]   ECHOCARDIOGRAM COMPLETE  Result Date: 12/25/2020    ECHOCARDIOGRAM REPORT   Patient Name:   Phillip [name] Date of Exam: 12/25/2020 Medical Rec #:  [id number]        Height:       69.0 in Accession #:    [id number]       Weight:       166.0 lb Date of Birth:  21-Apr-1946        BSA:          1.909 m  Patient Age:    75 years         BP:           150/84 mmHg Patient Gender: M                HR:           62 bpm. Exam Location:  Inpatient Procedure: 2D Echo, Cardiac Doppler and Color Doppler Indications:    R07.9* Chest pain, unspecified  History:        Patient has prior history of Echocardiogram examinations, most                 recent 12/08/2018. Acute MI and CAD; Risk Factors:Dyslipidemia.  Sonographer:    Merrie Roof Referring Phys: [place]  1. Left ventricular ejection fraction, by estimation, is 50 to 55%. The left ventricle has low normal function. The left ventricle has no regional wall motion abnormalities. There is mild left ventricular hypertrophy. Left ventricular diastolic parameters are consistent with Grade  I diastolic dysfunction (impaired relaxation).  2. Right ventricular systolic function is normal. The right ventricular size is normal. Tricuspid regurgitation signal is inadequate for assessing PA pressure.  3. Left atrial size was moderately dilated.  4. The mitral valve is normal in structure. Trivial mitral valve regurgitation.  5. The aortic valve is tricuspid. Aortic valve regurgitation is mild. Mild aortic valve sclerosis is present, with no evidence of aortic valve stenosis.  6. The inferior vena cava is normal in size with greater than 50% respiratory variability, suggesting right atrial pressure of 3 mmHg. FINDINGS  Left Ventricle: Left ventricular ejection fraction, by estimation, is 50 to 55%. The left ventricle has low normal function. The left ventricle has no regional wall motion abnormalities. The left ventricular internal cavity size was normal in size. There is mild left ventricular hypertrophy. Left ventricular diastolic parameters are consistent with Grade I diastolic dysfunction (impaired relaxation). Right Ventricle: The right ventricular size is normal. No increase in right ventricular wall thickness. Right ventricular systolic function  is normal. Tricuspid regurgitation signal is inadequate for assessing PA pressure. Left Atrium: Left atrial size was moderately dilated. Right Atrium: Right atrial size was normal in size. Pericardium: There is no evidence of pericardial effusion. Mitral Valve: The mitral valve is normal in structure. Trivial mitral valve regurgitation. Tricuspid Valve: The tricuspid valve is normal in structure. Tricuspid valve regurgitation is trivial. Aortic Valve: The aortic valve is tricuspid. Aortic valve regurgitation is mild. Mild aortic valve sclerosis is present, with no evidence of aortic valve stenosis. Aortic valve mean gradient measures 7.0 mmHg. Aortic valve peak gradient measures 11.8 mmHg. Aortic valve area, by VTI measures 1.82 cm. Pulmonic Valve: The pulmonic valve was not well visualized. Pulmonic valve regurgitation is not visualized. Aorta: The aortic root and ascending aorta are structurally normal, with no evidence of dilitation. Venous: The inferior vena cava is normal in size with greater than 50% respiratory variability, suggesting right atrial pressure of 3 mmHg. IAS/Shunts: No atrial level shunt detected by color flow Doppler.  LEFT VENTRICLE PLAX 2D LVIDd:         4.60 cm  Diastology LVIDs:         3.40 cm  LV e' medial:    4.68 cm/s LV PW:         1.10 cm  LV E/e' medial:  9.4 LV IVS:        1.00 cm  LV e' lateral:   3.48 cm/s LVOT diam:     2.10 cm  LV E/e' lateral: 12.7 LV SV:         61 LV SV Index:   32 LVOT Area:     3.46 cm  RIGHT VENTRICLE RV Basal diam:  3.90 cm LEFT ATRIUM             Index       RIGHT ATRIUM           Index LA diam:        4.40 cm 2.31 cm/m  RA Area:     17.80 cm LA Vol (A2C):   91.8 ml 48.10 ml/m RA Volume:   37.80 ml  19.80 ml/m LA Vol (A4C):   90.8 ml 47.57 ml/m LA Biplane Vol: 92.4 ml 48.41 ml/m  AORTIC VALVE AV Area (Vmax):    1.77 cm AV Area (Vmean):   1.87 cm AV Area (VTI):     1.82 cm AV Vmax:           172.00  cm/s AV Vmean:          123.000 cm/s AV VTI:             0.337 m AV Peak Grad:      11.8 mmHg AV Mean Grad:      7.0 mmHg LVOT Vmax:         88.10 cm/s LVOT Vmean:        66.300 cm/s LVOT VTI:          0.177 m LVOT/AV VTI ratio: 0.53  AORTA Ao Root diam: 3.80 cm Ao Asc diam:  3.70 cm MITRAL VALVE MV Area (PHT): 3.85 cm    SHUNTS MV Decel Time: 197 msec    Systemic VTI:  0.18 m MV E velocity: 44.10 cm/s  Systemic Diam: 2.10 cm MV A velocity: 83.10 cm/s MV E/A ratio:  0.53 Oswaldo Milian MD Electronically signed by Oswaldo Milian MD Signature Date/Time: 12/25/2020/9:52:39 PM    Final    Disposition   Pt is being discharged home today in good condition.  Follow-up Plans & Appointments     Follow-up Information    Yolonda Kida, MD Follow up on 01/07/2021.   Specialties: Cardiology, Internal Medicine Why: Please arrive 15 minutes early for your 10am post-hospital cardiology appointment Contact information: [place] [address] [phone]        Rusty Aus, MD Follow up.   Specialty: Internal Medicine Why: Please arrange a follow-up appointment to be seen later this week to have your coumadin levels checked.  Contact information: [facility] [place] [address] [phone]              Discharge Instructions    Amb Referral to Cardiac Rehabilitation   Complete by: As directed    Diagnosis:  Coronary Stents NSTEMI     After initial evaluation and assessments completed: Virtual Based Care may be provided alone or in conjunction with Phase 2 Cardiac Rehab based on patient barriers.: Yes      Discharge Medications   Allergies as of 12/29/2020      Reactions   Doxazosin Other (See Comments)   Other reaction(s): Unknown      Medication List    STOP taking these medications   heparin 25000 UT/250ML infusion   omeprazole 40 MG capsule Commonly known as: PRILOSEC Replaced by: pantoprazole 40 MG tablet   testosterone cypionate 200 MG/ML  injection Commonly known as: DEPOTESTOSTERONE CYPIONATE     TAKE these medications   acetaminophen 500 MG tablet Commonly known as: TYLENOL Take 500 mg by mouth every 6 (six) hours as needed.   amLODipine 5 MG tablet Commonly known as: NORVASC Take 5 mg by mouth daily.   aspirin 81 MG EC tablet Take 1 tablet (81 mg total) by mouth daily. Swallow whole.   atorvastatin 80 MG tablet Commonly known as: LIPITOR Take 1 tablet (80 mg total) by mouth daily. What changed:   medication strength  how much to take   cetirizine 10 MG tablet Commonly known as: ZYRTEC Take 10 mg by mouth 2 (two) times daily.   cholecalciferol 25 MCG (1000 UNIT) tablet Commonly known as: VITAMIN D3 Take 1,000 Units by mouth daily.   Chromium 1 MG Caps Take 1 mg by mouth daily. 1 per day   clopidogrel 75 MG tablet Commonly known as: PLAVIX Take 1 tablet (75 mg total) by mouth daily with breakfast.   enoxaparin 80 MG/0.8ML injection Commonly known as: LOVENOX  Inject 1 syringe (80 mg total) into the skin 2 (two) times daily.   GLUCOSAMINE CHONDR 1500 COMPLX PO Take 1 tablet by mouth daily.   levothyroxine 50 MCG tablet Commonly known as: SYNTHROID Take 50 mcg by mouth.   metoprolol tartrate 25 MG tablet Commonly known as: LOPRESSOR Take 0.5 tablets (12.5 mg total) by mouth 2 (two) times daily.   multivitamin tablet Take 1 tablet by mouth daily.   niacin 500 MG tablet Take 500 mg by mouth at bedtime.   nitroGLYCERIN 0.4 MG SL tablet Commonly known as: NITROSTAT Place 1 tablet (0.4 mg total) under the tongue every 5 (five) minutes x 3 doses as needed for chest pain.   pantoprazole 40 MG tablet Commonly known as: PROTONIX Take 1 tablet (40 mg total) by mouth daily. Replaces: omeprazole 40 MG capsule   potassium phosphate (monobasic) 500 MG tablet Commonly known as: K-PHOS ORIGINAL Take 500 mg by mouth daily.   TURMERIC PO Take 1 tablet by mouth daily.   warfarin 4 MG  tablet Commonly known as: Coumadin Take 4mg  daily with the exception of 2mg  daily on Mondays and Thursdays          Outstanding Labs/Studies   Will need INR check later this week  Will need repeat FLP/LFTs in 6-8 weeks  Duration of Discharge Encounter   Greater than 30 minutes including physician time.  Signed, Abigail Butts, PA-C 12/29/2020, 10:16 AM   Personally seen and examined. Agree with APP above with the following comments: Briefly 75 yo M with history of DVT, HTN, HLd, DVT and CKD stage IIIb who presented for NSTEMI.  S/p PCI. Outpatient would consider PCSK9i.  Otherwise as above.  Family had no further questions.  Rudean Haskell, MD [address] [place], [place] [address] [phone]  12:35 PM

## 2020-12-29 NOTE — Progress Notes (Signed)
CARDIAC REHAB PHASE I   Offered to walk with pt. Pt states independent ambulation without difficulty. Pt denies CP, SOB, or dizziness. Pt educated on importance of ASA, Plavix, and Coumadin. Reviewed signs and symptoms of bleeding. Pt given MI book along with heart healthy and diabetic diets. Reviewed site care, restrictions, and exercise guidelines. Will refer to CRP II Scissors.  [id number] Phillip Falco, RN BSN 12/29/2020 9:12 AM

## 2020-12-29 NOTE — Discharge Instructions (Signed)
PLEASE REMEMBER TO BRING ALL OF YOUR MEDICATIONS TO EACH OF YOUR FOLLOW-UP OFFICE VISITS.  PLEASE ATTEND ALL SCHEDULED FOLLOW-UP APPOINTMENTS.   Activity: Increase activity slowly as tolerated. You may shower, but no soaking baths (or swimming) for 1 week. No driving for 24 hours. No lifting over 5 lbs for 1 week. No sexual activity for 1 week.   You May Return to Work: in 2 weeks (if applicable)  Wound Care: You may wash cath site gently with soap and water. Keep cath site clean and dry. If you notice pain, swelling, bleeding or pus at your cath site, please call [phone].  MEDICATION CHANGES: - START aspirin 81 mg daily - you will continue this medication for 1 month, then stop - START plavix (clopidogrel) 75mg  daily - this medication, in addition to aspirin, works to keep your stent open. Missing doses puts you at risk for developing a blockage in your stent which can cause a heart attack. - START lovenox (enoxaparin) injections - inject 80mg  two times per day for the next 5 days while your coumadin levels build back up in your blood stream.  - START nitroglycerin sublingual - to be used AS NEEDED if you develop recurrent chest tightness/pressure that is not relieved with rest. You place a tab under your tongue. If pain persists after 5 minutes you can take an additional tab. You can take up to 3 tabs in a 15 minute time frame, however if you take a 3rd tab, you should call EMS.  - STOP omeprazole - this medication can decrease the effectiveness of plavix which is working to keep your stent open - START pantoprazole - this is an antacid medication similar to omeprazole - STOP testosterone - this medication increases your risk for heart disease. You can discuss this further with your urologist outpatient - AVOID use of medications in the NSAID family (ibuprofen, motrin, advil, naproxen, naprosyn, BC powder, etc.) as these medications increase your bleeding risk when on aspirin, plavix, and  coumadin.

## 2020-12-30 ENCOUNTER — Other Ambulatory Visit: Payer: Self-pay

## 2020-12-30 ENCOUNTER — Ambulatory Visit: Payer: Medicare Other

## 2020-12-30 ENCOUNTER — Other Ambulatory Visit (HOSPITAL_COMMUNITY): Payer: Self-pay

## 2020-12-30 ENCOUNTER — Telehealth: Payer: Self-pay | Admitting: Cardiovascular Disease

## 2020-12-30 NOTE — Telephone Encounter (Signed)
Patient came in for groin check.  The right groin is nontender.  There is mild ecchymosis around the puncture site without firm hematoma.  There is no bruit.  There is no lower quadrant or flank tenderness on either the right or left side.  Advised limited activity for the next few days, offered pain medication but he declined and said he would just take Tylenol.  Overall reassuring exam.

## 2020-12-30 NOTE — Telephone Encounter (Signed)
Patient called and said he just got a stint put in and is experiencing severe pain and that location and its hard for him to walk. Sent call to triage

## 2020-12-30 NOTE — Telephone Encounter (Signed)
Reviewed patient complaint with Dr. Burt Knack who advised that patient come to our office for evaluation. Patient aware and agrees with plan. Office location and instructions for check-in given.

## 2020-12-30 NOTE — Telephone Encounter (Signed)
Received call directly from operator from patient who had cardiac cath on 4/11. He c/o extreme soreness in the area of his lower abdomen/pubic region that started last night as he was getting into the bed. He states he was able to ambulate without difficulty and walked himself out to his car upon discharge from [facility] on 4/12. States pain is not in the region of the right groin where the catheter was inserted.  No pain with light touch but feels it with deep palpation and was able to sleep but did wake up during the night when he moved. Had to get in the car this morning to go to an appointment and he felt extreme discomfort. Hx bladder cancer for which he has frequent cystoscopy. Denies blood in urine, states he is urinating without difficulty and urine is yellow in color. Bowel movement yesterday afternoon - normal in appearance. Feels soreness in lower back region but has not looked at the skin of his lower back or buttock region. States he cannot do that right now because he is outside [facility] awaiting our advice. Advised that I will review with Dr. Burt Knack, who is in the office today and that I will call him back with his advice.

## 2020-12-30 NOTE — Telephone Encounter (Signed)
The patient is in the office for Dr. Burt Knack to evaluate groin site.

## 2021-01-05 ENCOUNTER — Inpatient Hospital Stay (HOSPITAL_COMMUNITY)
Admission: EM | Admit: 2021-01-05 | Discharge: 2021-01-07 | DRG: 281 | Disposition: A | Discharge status: Home / Self Care | Payer: Medicare Other | Attending: Cardiology | Admitting: Cardiology

## 2021-01-05 ENCOUNTER — Encounter (HOSPITAL_COMMUNITY): Payer: Self-pay | Admitting: Emergency Medicine

## 2021-01-05 ENCOUNTER — Other Ambulatory Visit: Payer: Self-pay

## 2021-01-05 ENCOUNTER — Emergency Department (HOSPITAL_COMMUNITY): Payer: Medicare Other

## 2021-01-05 DIAGNOSIS — N1831 Chronic kidney disease, stage 3a: Secondary | ICD-10-CM | POA: Diagnosis not present

## 2021-01-05 DIAGNOSIS — Z7901 Long term (current) use of anticoagulants: Secondary | ICD-10-CM

## 2021-01-05 DIAGNOSIS — Z86711 Personal history of pulmonary embolism: Secondary | ICD-10-CM

## 2021-01-05 DIAGNOSIS — I129 Hypertensive chronic kidney disease with stage 1 through stage 4 chronic kidney disease, or unspecified chronic kidney disease: Secondary | ICD-10-CM | POA: Diagnosis present

## 2021-01-05 DIAGNOSIS — Z7902 Long term (current) use of antithrombotics/antiplatelets: Secondary | ICD-10-CM | POA: Diagnosis not present

## 2021-01-05 DIAGNOSIS — Z8546 Personal history of malignant neoplasm of prostate: Secondary | ICD-10-CM | POA: Diagnosis not present

## 2021-01-05 DIAGNOSIS — E039 Hypothyroidism, unspecified: Secondary | ICD-10-CM | POA: Diagnosis present

## 2021-01-05 DIAGNOSIS — Z85828 Personal history of other malignant neoplasm of skin: Secondary | ICD-10-CM

## 2021-01-05 DIAGNOSIS — Z8551 Personal history of malignant neoplasm of bladder: Secondary | ICD-10-CM | POA: Diagnosis not present

## 2021-01-05 DIAGNOSIS — Z955 Presence of coronary angioplasty implant and graft: Secondary | ICD-10-CM | POA: Diagnosis not present

## 2021-01-05 DIAGNOSIS — E785 Hyperlipidemia, unspecified: Secondary | ICD-10-CM | POA: Diagnosis present

## 2021-01-05 DIAGNOSIS — I440 Atrioventricular block, first degree: Secondary | ICD-10-CM | POA: Diagnosis present

## 2021-01-05 DIAGNOSIS — I25110 Atherosclerotic heart disease of native coronary artery with unstable angina pectoris: Secondary | ICD-10-CM | POA: Diagnosis present

## 2021-01-05 DIAGNOSIS — Z7982 Long term (current) use of aspirin: Secondary | ICD-10-CM | POA: Diagnosis not present

## 2021-01-05 DIAGNOSIS — Z8249 Family history of ischemic heart disease and other diseases of the circulatory system: Secondary | ICD-10-CM

## 2021-01-05 DIAGNOSIS — I2699 Other pulmonary embolism without acute cor pulmonale: Secondary | ICD-10-CM | POA: Diagnosis present

## 2021-01-05 DIAGNOSIS — Z961 Presence of intraocular lens: Secondary | ICD-10-CM | POA: Diagnosis present

## 2021-01-05 DIAGNOSIS — I200 Unstable angina: Secondary | ICD-10-CM | POA: Diagnosis present

## 2021-01-05 DIAGNOSIS — E782 Mixed hyperlipidemia: Secondary | ICD-10-CM | POA: Diagnosis present

## 2021-01-05 DIAGNOSIS — Z9842 Cataract extraction status, left eye: Secondary | ICD-10-CM | POA: Diagnosis not present

## 2021-01-05 DIAGNOSIS — K219 Gastro-esophageal reflux disease without esophagitis: Secondary | ICD-10-CM | POA: Diagnosis present

## 2021-01-05 DIAGNOSIS — Z86718 Personal history of other venous thrombosis and embolism: Secondary | ICD-10-CM | POA: Diagnosis not present

## 2021-01-05 DIAGNOSIS — I214 Non-ST elevation (NSTEMI) myocardial infarction: Secondary | ICD-10-CM | POA: Diagnosis present

## 2021-01-05 DIAGNOSIS — E1169 Type 2 diabetes mellitus with other specified complication: Secondary | ICD-10-CM | POA: Diagnosis present

## 2021-01-05 DIAGNOSIS — Z9841 Cataract extraction status, right eye: Secondary | ICD-10-CM | POA: Diagnosis not present

## 2021-01-05 DIAGNOSIS — D6859 Other primary thrombophilia: Secondary | ICD-10-CM | POA: Diagnosis present

## 2021-01-05 DIAGNOSIS — E1122 Type 2 diabetes mellitus with diabetic chronic kidney disease: Secondary | ICD-10-CM | POA: Diagnosis present

## 2021-01-05 DIAGNOSIS — Z20822 Contact with and (suspected) exposure to covid-19: Secondary | ICD-10-CM | POA: Diagnosis present

## 2021-01-05 DIAGNOSIS — Z888 Allergy status to other drugs, medicaments and biological substances status: Secondary | ICD-10-CM | POA: Diagnosis not present

## 2021-01-05 DIAGNOSIS — I10 Essential (primary) hypertension: Secondary | ICD-10-CM | POA: Diagnosis present

## 2021-01-05 DIAGNOSIS — R079 Chest pain, unspecified: Secondary | ICD-10-CM

## 2021-01-05 DIAGNOSIS — Z79899 Other long term (current) drug therapy: Secondary | ICD-10-CM

## 2021-01-05 DIAGNOSIS — R778 Other specified abnormalities of plasma proteins: Secondary | ICD-10-CM

## 2021-01-05 DIAGNOSIS — R7989 Other specified abnormal findings of blood chemistry: Secondary | ICD-10-CM

## 2021-01-05 DIAGNOSIS — I2510 Atherosclerotic heart disease of native coronary artery without angina pectoris: Secondary | ICD-10-CM | POA: Diagnosis present

## 2021-01-05 DIAGNOSIS — N183 Chronic kidney disease, stage 3 unspecified: Secondary | ICD-10-CM | POA: Diagnosis present

## 2021-01-05 LAB — RESP PANEL BY RT-PCR (FLU A&B, COVID) ARPGX2
Influenza A by PCR: NEGATIVE
Influenza B by PCR: NEGATIVE
SARS Coronavirus 2 by RT PCR: NEGATIVE

## 2021-01-05 LAB — BASIC METABOLIC PANEL
Anion gap: 5 (ref 5–15)
BUN: 19 mg/dL (ref 8–23)
CO2: 27 mmol/L (ref 22–32)
Calcium: 8.7 mg/dL — ABNORMAL LOW (ref 8.9–10.3)
Chloride: 107 mmol/L (ref 98–111)
Creatinine, Ser: 1.45 mg/dL — ABNORMAL HIGH (ref 0.61–1.24)
GFR, Estimated: 51 mL/min — ABNORMAL LOW (ref >60)
Glucose, Bld: 119 mg/dL — ABNORMAL HIGH (ref 70–99)
Potassium: 4 mmol/L (ref 3.5–5.1)
Sodium: 139 mmol/L (ref 135–145)

## 2021-01-05 LAB — CBC
HCT: 50.4 % (ref 39.0–52.0)
Hemoglobin: 15.4 g/dL (ref 13.0–17.0)
MCH: 27.3 pg (ref 26.0–34.0)
MCHC: 30.6 g/dL (ref 30.0–36.0)
MCV: 89.4 fL (ref 80.0–100.0)
Platelets: 436 10*3/uL — ABNORMAL HIGH (ref 150–400)
RBC: 5.64 MIL/uL (ref 4.22–5.81)
RDW: 15.9 % — ABNORMAL HIGH (ref 11.5–15.5)
WBC: 10.6 10*3/uL — ABNORMAL HIGH (ref 4.0–10.5)
nRBC: 0.3 % — ABNORMAL HIGH (ref 0.0–0.2)

## 2021-01-05 LAB — TROPONIN I (HIGH SENSITIVITY)
Troponin I (High Sensitivity): 366 ng/L (ref <18)
Troponin I (High Sensitivity): 538 ng/L (ref <18)
Troponin I (High Sensitivity): 729 ng/L (ref <18)
Troponin I (High Sensitivity): 776 ng/L (ref <18)

## 2021-01-05 LAB — PROTIME-INR
INR: 1.6 — ABNORMAL HIGH (ref 0.8–1.2)
Prothrombin Time: 19.3 seconds — ABNORMAL HIGH (ref 11.4–15.2)

## 2021-01-05 LAB — T4, FREE: Free T4: 0.8 ng/dL (ref 0.61–1.12)

## 2021-01-05 MED ORDER — VITAMIN D 25 MCG (1000 UNIT) PO TABS
1000.0000 [IU] | ORAL_TABLET | Freq: Every day | ORAL | Status: DC
  Administered 2021-01-05 – 2021-01-07 (×3): 1000 [IU] via ORAL
  Filled 2021-01-05 (×4): qty 1

## 2021-01-05 MED ORDER — SODIUM CHLORIDE 0.9 % IV SOLN: 250.0000 mL | INTRAVENOUS | Status: DC | PRN

## 2021-01-05 MED ORDER — HEPARIN BOLUS VIA INFUSION
4000.0000 [IU] | Freq: Once | INTRAVENOUS | Status: AC
  Administered 2021-01-05: 4000 [IU] via INTRAVENOUS
  Filled 2021-01-05: qty 4000

## 2021-01-05 MED ORDER — ATORVASTATIN CALCIUM 80 MG PO TABS
80.0000 mg | ORAL_TABLET | Freq: Every day | ORAL | Status: DC
  Administered 2021-01-06 – 2021-01-07 (×2): 80 mg via ORAL
  Filled 2021-01-05 (×2): qty 1

## 2021-01-05 MED ORDER — ZOLPIDEM TARTRATE 5 MG PO TABS: 5.0000 mg | ORAL_TABLET | Freq: Every evening | ORAL | Status: DC | PRN

## 2021-01-05 MED ORDER — NITROGLYCERIN IN D5W 200-5 MCG/ML-% IV SOLN
0.0000 ug/min | INTRAVENOUS | Status: DC
Start: 2021-01-05 — End: 2021-01-06
  Administered 2021-01-05: 5 ug/min via INTRAVENOUS
  Filled 2021-01-05: qty 250

## 2021-01-05 MED ORDER — ADULT MULTIVITAMIN W/MINERALS CH
1.0000 | ORAL_TABLET | Freq: Every day | ORAL | Status: DC
  Administered 2021-01-06 – 2021-01-07 (×2): 1 via ORAL
  Filled 2021-01-05 (×2): qty 1

## 2021-01-05 MED ORDER — K PHOS MONO-SOD PHOS DI & MONO 155-852-130 MG PO TABS
500.0000 mg | ORAL_TABLET | Freq: Every day | ORAL | Status: DC
  Administered 2021-01-05 – 2021-01-07 (×3): 500 mg via ORAL
  Filled 2021-01-05 (×3): qty 2

## 2021-01-05 MED ORDER — CLOPIDOGREL BISULFATE 75 MG PO TABS
75.0000 mg | ORAL_TABLET | Freq: Every day | ORAL | Status: DC
  Administered 2021-01-06 – 2021-01-07 (×2): 75 mg via ORAL
  Filled 2021-01-05 (×2): qty 1

## 2021-01-05 MED ORDER — SODIUM CHLORIDE 0.9% FLUSH: 3.0000 mL | Freq: Two times a day (BID) | INTRAVENOUS | Status: DC

## 2021-01-05 MED ORDER — SODIUM CHLORIDE 0.9 % IV SOLN: INTRAVENOUS | Status: DC

## 2021-01-05 MED ORDER — ALPRAZOLAM 0.25 MG PO TABS: 0.2500 mg | ORAL_TABLET | Freq: Two times a day (BID) | ORAL | Status: DC | PRN

## 2021-01-05 MED ORDER — NITROGLYCERIN 0.4 MG SL SUBL
0.4000 mg | SUBLINGUAL_TABLET | SUBLINGUAL | Status: DC | PRN
  Administered 2021-01-05: 0.4 mg via SUBLINGUAL
  Filled 2021-01-05 (×2): qty 1

## 2021-01-05 MED ORDER — ASPIRIN 81 MG PO CHEW
81.0000 mg | CHEWABLE_TABLET | ORAL | Status: AC
  Administered 2021-01-06: 81 mg via ORAL
  Filled 2021-01-05: qty 1

## 2021-01-05 MED ORDER — ASPIRIN EC 81 MG PO TBEC
81.0000 mg | DELAYED_RELEASE_TABLET | Freq: Every day | ORAL | Status: DC
  Administered 2021-01-07: 81 mg via ORAL
  Filled 2021-01-05: qty 1

## 2021-01-05 MED ORDER — METOPROLOL TARTRATE 12.5 MG HALF TABLET
12.5000 mg | ORAL_TABLET | Freq: Two times a day (BID) | ORAL | Status: DC
  Administered 2021-01-05 – 2021-01-07 (×4): 12.5 mg via ORAL
  Filled 2021-01-05 (×4): qty 1

## 2021-01-05 MED ORDER — AMLODIPINE BESYLATE 5 MG PO TABS
5.0000 mg | ORAL_TABLET | Freq: Every day | ORAL | Status: DC
  Administered 2021-01-06 – 2021-01-07 (×2): 5 mg via ORAL
  Filled 2021-01-05 (×2): qty 1

## 2021-01-05 MED ORDER — POTASSIUM PHOSPHATE MONOBASIC 500 MG PO TABS
500.0000 mg | ORAL_TABLET | Freq: Every day | ORAL | Status: DC
  Filled 2021-01-05 (×2): qty 1

## 2021-01-05 MED ORDER — LORATADINE 10 MG PO TABS
10.0000 mg | ORAL_TABLET | Freq: Two times a day (BID) | ORAL | Status: DC
  Administered 2021-01-05 – 2021-01-07 (×4): 10 mg via ORAL
  Filled 2021-01-05 (×4): qty 1

## 2021-01-05 MED ORDER — NIACIN 500 MG PO TABS
500.0000 mg | ORAL_TABLET | Freq: Every day | ORAL | Status: DC
  Administered 2021-01-05: 500 mg via ORAL
  Filled 2021-01-05: qty 1

## 2021-01-05 MED ORDER — ONDANSETRON HCL 4 MG/2ML IJ SOLN: 4.0000 mg | Freq: Four times a day (QID) | INTRAMUSCULAR | Status: DC | PRN

## 2021-01-05 MED ORDER — ACETAMINOPHEN 325 MG PO TABS
650.0000 mg | ORAL_TABLET | ORAL | Status: DC | PRN
  Administered 2021-01-06: 650 mg via ORAL
  Filled 2021-01-05: qty 2

## 2021-01-05 MED ORDER — LEVOTHYROXINE SODIUM 50 MCG PO TABS
50.0000 ug | ORAL_TABLET | Freq: Every day | ORAL | Status: DC
  Administered 2021-01-06 – 2021-01-07 (×2): 50 ug via ORAL
  Filled 2021-01-05 (×2): qty 1

## 2021-01-05 MED ORDER — SODIUM CHLORIDE 0.9% FLUSH: 3.0000 mL | INTRAVENOUS | Status: DC | PRN

## 2021-01-05 MED ORDER — HEPARIN (PORCINE) 25000 UT/250ML-% IV SOLN
1100.0000 [IU]/h | INTRAVENOUS | Status: DC
  Administered 2021-01-05: 900 [IU]/h via INTRAVENOUS
  Filled 2021-01-05: qty 250

## 2021-01-05 NOTE — ED Provider Notes (Signed)
[place] EMERGENCY DEPARTMENT Provider Note   CSN: [id number] Arrival date & time: 01/05/21  1246     History No chief complaint on file.   Phillip [name] is a 75 y.o. male.  Pt presents to the ED today with cp.  The pt initially presented to [facility] with cp and had an elevated troponin.  He had a cath there which showed 95% ostial/proximal LAD stenosis, 50% pLCx stenosis, and unable to visualize RCA. He was transferred to [facility] for consideration of possible complex PCI to LAD vs CABG.  He was taken back to the cath lab here on 12/28/20 for atherectomy of LAD. He underwent successful PCI/DES to pLAD with additional findings of CTO of RCA with collateral vessel supplying m-dRCA stenosis and codominant PDA branch. He has had cp similar to the pain he had prior to the stent for 3 days.  He saw his pcp today who recommended pt come to the ED.  Pt has cp now.  Pt said he's been compliant with his meds.        Past Medical History:  Diagnosis Date  . Arthritis   . Basal cell carcinoma    L clavicle, L prox forearm- removed years ago   . Bladder cancer ([place])   . Bladder neck contracture   . CAD (coronary artery disease)    a. 12/2020 NSTEMI/Cath: LM nl, LAD 95ost/p, LCX 50p, RCA nl. EF 45-50%.  . Cataract   . CKD (chronic kidney disease), stage III ([place])   . ED (erectile dysfunction)   . Frequency   . GERD (gastroesophageal reflux disease)   . 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 Following LE DVT-->Chronic warfarin.  Marland Kitchen Hyperlipidemia LDL goal <70   . Hypertension   . Hypothyroidism   . Incontinence of urine    sui, s/p cryoablation  . Ischemic cardiomyopathy    a. 11/2018 Echo: EF 60-65%; b. 12/2020 LV Gram: EF 45-50% in setting of NSTEMI.  Marland Kitchen Kidney stones   . Neuropathy    feet  . Nocturia   . Prostate cancer ([place])    S/P   CRYOABLATION  . Right elbow tendinitis   . Right Lower Extremity DVT (deep venous thrombosis) ([place]) 11/25/2018  . Vertigo     1-2x/yr  . Wears glasses     Patient Active Problem List   Diagnosis Date Noted  . Hyperlipidemia LDL goal <70   . CKD (chronic kidney disease), stage III ([place])   . CAD (coronary artery disease)   . NSTEMI (non-ST elevated myocardial infarction) ([place]) 12/23/2020  . Acquired hypothyroidism 04/20/2020  . 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 Hypercoagulable state ([place]) 12/10/2018  . Acute deep vein thrombosis (DVT) of calf muscle vein of right lower extremity ([place])   . Pulmonary emboli ([place]) 12/07/2018  . GERD (gastroesophageal reflux disease) 12/07/2018  . HTN (hypertension) 12/07/2018  . HLD (hyperlipidemia) 12/07/2018  . Prostate cancer ([place]) 12/07/2018  . Bladder cancer ([place]) 12/07/2018  . DM type 2 with diabetic mixed hyperlipidemia ([place]) 04/04/2018  . Pain in right hand 10/04/2017  . Tubular adenoma 09/29/2017  . Helicobacter pylori (H. pylori) infection 09/04/2017  . Medicare annual wellness visit, initial 03/31/2017  . History of prostate cancer 10/27/2014  . Lower urinary tract symptoms (LUTS) 03/18/2012  . Nephrolithiasis 03/18/2012    Past Surgical History:  Procedure Laterality Date  . ARTHROPLASTY AND TENDON REPAIR LEFT THUMB  JAN 2014  .  CATARACT EXTRACTION W/PHACO Left 11/16/2020   Procedure: CATARACT EXTRACTION PHACO AND INTRAOCULAR LENS PLACEMENT (IOC) LEFT  7.09 00:56.4 12.6%;  Surgeon: Leandrew Koyanagi, MD;  Location: [facility];  Service: Ophthalmology;  Laterality: Left;  . CATARACT EXTRACTION W/PHACO Right 12/02/2020   Procedure: CATARACT EXTRACTION PHACO AND INTRAOCULAR LENS PLACEMENT (IOC) RIGHT MALYUGIN 5.52 00:49.7 11.1%;  Surgeon: Leandrew Koyanagi, MD;  Location: [facility];  Service: Ophthalmology;  Laterality: Right;  . COLONOSCOPY    . COLONOSCOPY WITH PROPOFOL N/A 08/09/2017   Procedure: COLONOSCOPY WITH PROPOFOL;  Surgeon: Manya Silvas, MD;  Location: [facility]  ENDOSCOPY;  Service: Endoscopy;  Laterality: N/A;  . CORONARY ANGIOGRAPHY N/A 12/28/2020   Procedure: CORONARY ANGIOGRAPHY;  Surgeon: Sherren Mocha, MD;  Location: [place] CV LAB;  Service: Cardiovascular;  Laterality: N/A;  . CORONARY STENT INTERVENTION N/A 12/28/2020   Procedure: CORONARY STENT INTERVENTION;  Surgeon: Sherren Mocha, MD;  Location: [place] CV LAB;  Service: Cardiovascular;  Laterality: N/A;  . ESOPHAGOGASTRODUODENOSCOPY (EGD) WITH PROPOFOL N/A 08/09/2017   Procedure: ESOPHAGOGASTRODUODENOSCOPY (EGD) WITH PROPOFOL;  Surgeon: Manya Silvas, MD;  Location: [facility] ENDOSCOPY;  Service: Endoscopy;  Laterality: N/A;  . GREEN LIGHT LASER TURP (TRANSURETHRAL RESECTION OF PROSTATE N/A 12/14/2015   Procedure: GREEN LIGHT LASER TURP (TRANSURETHRAL RESECTION OF PROSTATE) LASER OF BLADDER NECK CONTRACTURE;  Surgeon: Carolan Clines, MD;  Location: [place];  Service: Urology;  Laterality: N/A;  . LEFT HEART CATH AND CORONARY ANGIOGRAPHY N/A 12/24/2020   Procedure: LEFT HEART CATH AND CORONARY ANGIOGRAPHY and possible PCI and stent;  Surgeon: Yolonda Kida, MD;  Location: [place] CV LAB;  Service: Cardiovascular;  Laterality: N/A;  . PENILE PROSTHESIS IMPLANT N/A 12/06/2013   Procedure: PENILE PROTHESIS INFLATABLE;  Surgeon: Ailene Rud, MD;  Location: [facility];  Service: Urology;  Laterality: N/A;  . PROSTATE CRYOABLATION  06-01-2012  [facility]  . TRANSURETHRAL RESECTION OF BLADDER NECK N/A 03/20/2015   Procedure: RELEASE BLADDER NECK CONTRACTURE  WITH WOLF BUTTON ELECTRODE ;  Surgeon: Carolan Clines, MD;  Location: [place];  Service: Urology;  Laterality: N/A;  . TRANSURETHRAL RESECTION OF BLADDER TUMOR N/A 12/05/2018   Procedure: TRANSURETHRAL RESECTION OF BLADDER TUMOR (TURBT)/CYSTOSCOPY/  INSTILLATION OF Rodell Perna;  Surgeon: Ceasar Mons, MD;  Location: [facility];  Service:  Urology;  Laterality: N/A;  . TRANSURETHRAL RESECTION OF BLADDER TUMOR N/A 01/15/2020   Procedure: TRANSURETHRAL RESECTION OF BLADDER TUMOR (TURBT)/ CYSTOSCOPY;  Surgeon: Ceasar Mons, MD;  Location: [facility];  Service: Urology;  Laterality: N/A;  . TRIGGER FINGER RELEASE Left 10/2015  . ulner nerve neuropathy         Family History  Problem Relation Age of Onset  . CAD Father 29    Social History   Tobacco Use  . Smoking status: Never Smoker  . Smokeless tobacco: Never Used  Vaping Use  . Vaping Use: Never used  Substance Use Topics  . Alcohol use: No  . Drug use: No    Home Medications Prior to Admission medications   Medication Sig Start Date End Date Taking? Authorizing Provider  acetaminophen (TYLENOL) 500 MG tablet Take 500 mg by mouth every 6 (six) hours as needed.   Yes [provider]  amLODipine (NORVASC) 5 MG tablet Take 5 mg by mouth daily.   Yes [provider]  aspirin 81 MG EC tablet Take 1 tablet (81 mg total) by mouth daily. Swallow whole. 12/29/20  Yes Kroeger, Daleen Snook M., PA-C  atorvastatin (LIPITOR) 80 MG tablet Take 1 tablet (80 mg total) by mouth daily. 12/29/20  Yes Kroeger, Lorelee Cover., PA-C  cholecalciferol (VITAMIN D3) 25 MCG (1000 UT) tablet Take 1,000 Units by mouth daily.   Yes [provider]  Chromium 1 MG CAPS Take 1 mg by mouth daily. 1 per day   Yes [provider]  clopidogrel (PLAVIX) 75 MG tablet Take 1 tablet (75 mg total) by mouth daily with breakfast. 12/29/20  Yes Kroeger, Daleen Snook M., PA-C  Glucosamine-Chondroit-Vit C-Mn (GLUCOSAMINE CHONDR 1500 COMPLX PO) Take 1 tablet by mouth every evening.   Yes [provider]  levothyroxine (SYNTHROID) 50 MCG tablet Take 50 mcg by mouth. 04/20/20 04/20/21 Yes [provider]  loratadine (CLARITIN) 10 MG tablet Take 10 mg by mouth in the morning and at bedtime.   Yes [provider]  metoprolol tartrate (LOPRESSOR) 25  MG tablet Take 0.5 tablets (12.5 mg total) by mouth 2 (two) times daily. 12/25/20  Yes Fritzi Mandes, MD  Multiple Vitamin (MULTIVITAMIN) tablet Take 1 tablet by mouth daily.   Yes [provider]  niacin 500 MG tablet Take 500 mg by mouth at bedtime.   Yes [provider]  nitroGLYCERIN (NITROSTAT) 0.4 MG SL tablet Place 1 tablet (0.4 mg total) under the tongue every 5 (five) minutes x 3 doses as needed for chest pain. 12/29/20  Yes Kroeger, Daleen Snook M., PA-C  pantoprazole (PROTONIX) 40 MG tablet Take 1 tablet (40 mg total) by mouth daily. 12/29/20  Yes Kroeger, Lorelee Cover., PA-C  potassium phosphate, monobasic, (K-PHOS ORIGINAL) 500 MG tablet Take 500 mg by mouth daily.   Yes [provider]  TURMERIC PO Take 1 tablet by mouth daily.   Yes [provider]  warfarin (COUMADIN) 4 MG tablet Take 4mg  daily with the exception of 2mg  daily on Mondays and Thursdays Patient taking differently: Take 2-4 mg by mouth See admin instructions. Take 4mg  daily by mouth with the exception of 2mg  daily by mouth on Mondays and Thursdays 12/29/20  Yes Kroeger, Krista M., PA-C    Allergies    Doxazosin  Review of Systems   Review of Systems  Cardiovascular: Positive for chest pain.  All other systems reviewed and are negative.   Physical Exam Updated Vital Signs BP 107/71   Pulse 61   Temp 98.3 F (36.8 C) (Oral)   Resp 17   Ht 5\' 9"  (1.753 m)   Wt 74.7 kg   SpO2 96%   BMI 24.31 kg/m   Physical Exam Vitals and nursing note reviewed.  Constitutional:      Appearance: Normal appearance.  HENT:     Head: Normocephalic and atraumatic.     Right Ear: External ear normal.     Left Ear: External ear normal.     Nose: Nose normal.     Mouth/Throat:     Mouth: Mucous membranes are moist.     Pharynx: Oropharynx is clear.  Eyes:     Extraocular Movements: Extraocular movements intact.     Conjunctiva/sclera: Conjunctivae normal.     Pupils: Pupils are equal, round, and  reactive to light.  Cardiovascular:     Rate and Rhythm: Normal rate and regular rhythm.     Pulses: Normal pulses.     Heart sounds: Normal heart sounds.  Pulmonary:     Effort: Pulmonary effort is normal.     Breath sounds: Normal breath sounds.  Abdominal:  General: Abdomen is flat. Bowel sounds are normal.     Palpations: Abdomen is soft.  Musculoskeletal:        General: Normal range of motion.     Cervical back: Normal range of motion and neck supple.  Skin:    General: Skin is warm.     Capillary Refill: Capillary refill takes less than 2 seconds.  Neurological:     General: No focal deficit present.     Mental Status: He is alert.  Psychiatric:        Mood and Affect: Mood normal.        Behavior: Behavior normal.        Thought Content: Thought content normal.        Judgment: Judgment normal.     ED Results / Procedures / Treatments   Labs (all labs ordered are listed, but only abnormal results are displayed) Labs Reviewed  BASIC METABOLIC PANEL - Abnormal; Notable for the following components:      Result Value   Glucose, Bld 119 (*)    Creatinine, Ser 1.45 (*)    Calcium 8.7 (*)    GFR, Estimated 51 (*)    All other components within normal limits  CBC - Abnormal; Notable for the following components:   WBC 10.6 (*)    RDW 15.9 (*)    Platelets 436 (*)    nRBC 0.3 (*)    All other components within normal limits  PROTIME-INR - Abnormal; Notable for the following components:   Prothrombin Time 19.3 (*)    INR 1.6 (*)    All other components within normal limits  TROPONIN I (HIGH SENSITIVITY) - Abnormal; Notable for the following components:   Troponin I (High Sensitivity) 366 (*)    All other components within normal limits  RESP PANEL BY RT-PCR (FLU A&B, COVID) [id number]  TROPONIN I (HIGH SENSITIVITY)    EKG EKG Interpretation  Date/Time:  Tuesday January 05 2021 13:30:31 EDT Ventricular Rate:  58 PR Interval:  236 QRS Duration: 99 QT  Interval:  381 QTC Calculation: 375 R Axis:   50 Text Interpretation: Sinus rhythm Prolonged PR interval No significant change since last tracing Confirmed by Isla Pence [phone]) on 01/05/2021 3:34:17 PM   Radiology DG Chest 2 View  Result Date: 01/05/2021 CLINICAL DATA:  Chest pain.  Recent MI. EXAM: CHEST - 2 VIEW COMPARISON:  CT 12/23/2020.  Chest x-ray 12/23/2020. FINDINGS: Mediastinum hilar structures normal. Heart size normal. Low lung volumes with mild bibasilar atelectasis. Tiny calcified nodule left upper lung most likely tiny granuloma again noted. No pleural effusion or pneumothorax. Degenerative changes and scoliosis thoracic spine. IMPRESSION: No acute cardiopulmonary disease. Electronically Signed   By: Marcello Moores  Register   On: 01/05/2021 13:37    Procedures Procedures   Medications Ordered in ED Medications  nitroGLYCERIN (NITROSTAT) SL tablet 0.4 mg (0.4 mg Sublingual Given 01/05/21 1413)  nitroGLYCERIN 50 mg in dextrose 5 % 250 mL (0.2 mg/mL) infusion (has no administration in time range)    ED Course  I have reviewed the triage vital signs and the nursing notes.  Pertinent labs & imaging results that were available during my care of the patient were reviewed by me and considered in my medical decision making (see chart for details).    MDM Rules/Calculators/A&P                          Pt's cp is unchanged after nitro  sl.  Nitro gtt ordered.  Troponin is elevated.  INR is 1.6.  Will hold off on heparin until cards can see pt.  Cards consulted for admission.  Final Clinical Impression(s) / ED Diagnoses Final diagnoses:  Unstable angina [facility])    Rx / DC Orders ED Discharge Orders    None       Isla Pence, MD 01/05/21 1536

## 2021-01-05 NOTE — ED Triage Notes (Signed)
Pt here from home with c/o cp had a stent placed 1 week ago by By Dr Burt Knack

## 2021-01-05 NOTE — ED Triage Notes (Signed)
Emergency Medicine Provider Triage Evaluation Note  Phillip [name] , a 75 y.o. male  was evaluated in triage.  Pt complains of CP, recent stent placed a week ago, NSTEMI last week. States that chest pain feels similar to prior MI last week. CP started three days ago, no SOB. Feels like pressure.  Review of Systems  Positive: CP Negative: SOB  Physical Exam  BP 133/80 (BP Location: Right Arm)   Pulse 65   Resp 14   SpO2 100%  Gen:   Awake, no distress   HEENT:  Atraumatic Resp:  Normal effort Cardiac:  Normal rate Abd:   Nondistended, nontender  MSK:   Moves extremities without difficulty Neuro:  Speech clear   Medical Decision Making  Medically screening exam initiated at 12:54 PM.  Appropriate orders placed.  Phillip [name] was informed that the remainder of the evaluation will be completed by another provider, this initial triage assessment does not replace that evaluation, and the importance of remaining in the ED until their evaluation is complete.  Clinical Impression  Pt to be brought back next, high risk for ACS.   MSE was initiated and I personally evaluated the patient and placed orders (if any) at  12:59 PM on January 05, 2021.  The patient appears stable so that the remainder of the MSE may be completed by another provider.    Alfredia Client, PA-C 01/05/21 1300

## 2021-01-05 NOTE — ED Notes (Signed)
Pt toileted.  

## 2021-01-05 NOTE — H&P (Addendum)
Cardiology Admission History and Physical:   Patient ID: Phillip [name] MRN: [id number]; DOB: 04-27-1946   Admission date: 01/05/2021  PCP:  Phillip Aus, MD   [place]  Cardiologist:  Phillip Kida, MD 12/25/2020 Phillip Phillip [name] 12/25/2020 in-hospital Advanced Practice Provider:  No care team member to display Electrophysiologist:  None   Chief Complaint:  Chest pain  Patient Profile:   Phillip [name] is a 75 y.o. male with admit for NSTEMI, at [facility] 04/06-04/08, then tx to [facility], had DES LAD and d/c 12/29/2020.  History of Present Illness:   Phillip [name] was discharged on 12/29/2020. He felt a little washed out for the 2-3 days after d/c, but no CP. He then felt better and was able to ambulate outside and do small tasks. He developed chest pain starting 04/17 in the afternoon during a family dinner. Family present but not much activity.   The pain was a 3-4/10, reminded him of his NSTEMI pain. He did not take anything at that time. The pain would resolve at times, but he was not aware of anything that made it better. He was able to sleep.  On Monday, he saw Urology and had a cystoscopy that was previously scheduled. Procedure went well, good results. Urologist not happy about pt being taken off testosterone.   On the way home, the pain was a little worse, up to a 6/10. He took nitro x 2, they would help the pain but then it came back.   He saw Phillip [name] this am, ECG looked a little different and sent the pt to the ER.   In the ER, he continues to have CP at a 4/10. He denies SOB, no N&V or diaphoresis.   Since d/c, he has not missed any doses of ASA, Plavix. His omeprazole was changed to Protonix. He had 1 episode of water brash, but no ongoing GI sx.   Past Medical History:  Diagnosis Date  . Arthritis   . Basal cell carcinoma    L clavicle, L prox forearm- removed years ago   . Bladder cancer ([place])   . Bladder neck contracture   . CAD  (coronary artery disease)    a. 12/2020 NSTEMI/Cath: LM nl, LAD 95ost/p, LCX 50p, RCA nl. EF 45-50%.  . Cataract   . CKD (chronic kidney disease), stage III ([place])   . ED (erectile dysfunction)   . Frequency   . GERD (gastroesophageal reflux disease)   . 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 Following LE DVT-->Chronic warfarin.  Marland Kitchen Hyperlipidemia LDL goal <70   . Hypertension   . Hypothyroidism   . Incontinence of urine    sui, s/p cryoablation  . Ischemic cardiomyopathy    a. 11/2018 Echo: EF 60-65%; b. 12/2020 LV Gram: EF 45-50% in setting of NSTEMI.  Marland Kitchen Kidney stones   . Neuropathy    feet  . Nocturia   . Prostate cancer ([place])    S/P   CRYOABLATION  . Right elbow tendinitis   . Right Lower Extremity DVT (deep venous thrombosis) ([place]) 11/25/2018  . Vertigo    1-2x/yr  . Wears glasses     Past Surgical History:  Procedure Laterality Date  . ARTHROPLASTY AND TENDON REPAIR LEFT THUMB  JAN 2014  . CATARACT EXTRACTION W/PHACO Left 11/16/2020   Procedure: CATARACT EXTRACTION PHACO AND INTRAOCULAR LENS PLACEMENT (IOC) LEFT  7.09 00:56.4 12.6%;  Surgeon: Phillip Koyanagi, MD;  Location: [place];  Service: Ophthalmology;  Laterality: Left;  . CATARACT EXTRACTION W/PHACO Right 12/02/2020   Procedure: CATARACT EXTRACTION PHACO AND INTRAOCULAR LENS PLACEMENT (IOC) RIGHT MALYUGIN 5.52 00:49.7 11.1%;  Surgeon: Phillip Koyanagi, MD;  Location: [place];  Service: Ophthalmology;  Laterality: Right;  . COLONOSCOPY    . COLONOSCOPY WITH PROPOFOL N/A 08/09/2017   Procedure: COLONOSCOPY WITH PROPOFOL;  Surgeon: Phillip Silvas, MD;  Location[facility] ENDOSCOPY;  Service: Endoscopy;  Laterality: N/A;  . CORONARY ANGIOGRAPHY N/A 12/28/2020   Procedure: CORONARY ANGIOGRAPHY;  Surgeon: Phillip Mocha, MD;  Location: [place] CV LAB;  Service: Cardiovascular;  Laterality: N/A;  . CORONARY STENT INTERVENTION N/A 12/28/2020   Procedure: CORONARY STENT INTERVENTION;   Surgeon: Phillip Mocha, MD;  Location: [place] CV LAB;  Service: Cardiovascular;  Laterality: N/A;  . ESOPHAGOGASTRODUODENOSCOPY (EGD) WITH PROPOFOL N/A 08/09/2017   Procedure: ESOPHAGOGASTRODUODENOSCOPY (EGD) WITH PROPOFOL;  Surgeon: Phillip Silvas, MD;  Location: [facility] ENDOSCOPY;  Service: Endoscopy;  Laterality: N/A;  . GREEN LIGHT LASER TURP (TRANSURETHRAL RESECTION OF PROSTATE N/A 12/14/2015   Procedure: GREEN LIGHT LASER TURP (TRANSURETHRAL RESECTION OF PROSTATE) LASER OF BLADDER NECK CONTRACTURE;  Surgeon: Phillip Clines, MD;  Location: [place];  Service: Urology;  Laterality: N/A;  . LEFT HEART CATH AND CORONARY ANGIOGRAPHY N/A 12/24/2020   Procedure: LEFT HEART CATH AND CORONARY ANGIOGRAPHY and possible PCI and stent;  Surgeon: Phillip Kida, MD;  Location: [place] CV LAB;  Service: Cardiovascular;  Laterality: N/A;  . PENILE PROSTHESIS IMPLANT N/A 12/06/2013   Procedure: PENILE PROTHESIS INFLATABLE;  Surgeon: Phillip Rud, MD;  Location: [facility];  Service: Urology;  Laterality: N/A;  . PROSTATE CRYOABLATION  06-01-2012  [facility]  . TRANSURETHRAL RESECTION OF BLADDER NECK N/A 03/20/2015   Procedure: RELEASE BLADDER NECK CONTRACTURE  WITH [place]F [place] ELECTRODE ;  Surgeon: Phillip Clines, MD;  Location: [place];  Service: Urology;  Laterality: N/A;  . TRANSURETHRAL RESECTION OF BLADDER TUMOR N/A 12/05/2018   Procedure: TRANSURETHRAL RESECTION OF BLADDER TUMOR (TURBT)/CYSTOSCOPY/  INSTILLATION OF Phillip [name];  Surgeon: Phillip Mons, MD;  Location: [facility];  Service: Urology;  Laterality: N/A;  . TRANSURETHRAL RESECTION OF BLADDER TUMOR N/A 01/15/2020   Procedure: TRANSURETHRAL RESECTION OF BLADDER TUMOR (TURBT)/ CYSTOSCOPY;  Surgeon: Phillip Mons, MD;  Location: [facility];  Service: Urology;  Laterality: N/A;  . TRIGGER FINGER RELEASE Left 10/2015  .  ulner nerve neuropathy       Medications Prior to Admission: Prior to Admission medications   Medication Sig Start Date End Date Taking? Authorizing Provider  acetaminophen (TYLENOL) 500 MG tablet Take 500 mg by mouth every 6 (six) hours as needed.   Yes [provider]  amLODipine (NORVASC) 5 MG tablet Take 5 mg by mouth daily.   Yes [provider]  aspirin 81 MG EC tablet Take 1 tablet (81 mg total) by mouth daily. Swallow whole. 12/29/20  Yes Kroeger, Lorelee Cover., PA-C  atorvastatin (LIPITOR) 80 MG tablet Take 1 tablet (80 mg total) by mouth daily. 12/29/20  Yes Kroeger, Lorelee Cover., PA-C  cholecalciferol (VITAMIN D3) 25 MCG (1000 UT) tablet Take 1,000 Units by mouth daily.   Yes [provider]  Chromium 1 MG CAPS Take 1 mg by mouth daily. 1 per day   Yes [provider]  clopidogrel (PLAVIX) 75 MG tablet Take 1 tablet (75 mg total) by mouth daily with breakfast. 12/29/20  Yes Kroeger, Lorelee Cover., PA-C  Glucosamine-Chondroit-Vit C-Mn (GLUCOSAMINE CHONDR 1500 COMPLX PO) Take 1 tablet by mouth every evening.   Yes [provider]  levothyroxine (SYNTHROID) 50 MCG tablet Take 50 mcg by mouth. 04/20/20 04/20/21 Yes [provider]  loratadine (CLARITIN) 10 MG tablet Take 10 mg by mouth in the morning and at bedtime.   Yes [provider]  metoprolol tartrate (LOPRESSOR) 25 MG tablet Take 0.5 tablets (12.5 mg total) by mouth 2 (two) times daily. 12/25/20  Yes Fritzi Mandes, MD  Multiple Vitamin (MULTIVITAMIN) tablet Take 1 tablet by mouth daily.   Yes [provider]  niacin 500 MG tablet Take 500 mg by mouth at bedtime.   Yes [provider]  nitroGLYCERIN (NITROSTAT) 0.4 MG SL tablet Place 1 tablet (0.4 mg total) under the tongue every 5 (five) minutes x 3 doses as needed for chest pain. 12/29/20  Yes Kroeger, Daleen Snook M., PA-C  pantoprazole (PROTONIX) 40 MG tablet Take 1 tablet (40 mg total) by mouth daily. 12/29/20  Yes Kroeger,  Lorelee Cover., PA-C  potassium phosphate, monobasic, (K-PHOS ORIGINAL) 500 MG tablet Take 500 mg by mouth daily.   Yes [provider]  TURMERIC PO Take 1 tablet by mouth daily.   Yes [provider]  warfarin (COUMADIN) 4 MG tablet Take 23m daily with the exception of 253mdaily on Mondays and Thursdays Patient taking differently: Take 2-4 mg by mouth See admin instructions. Take 27m38maily by mouth with the exception of 2mg627mily by mouth on Mondays and Thursdays 12/29/20  Yes Kroeger, KrisLorelee CoverA-C     Allergies:    Allergies  Allergen Reactions  . Doxazosin Other (See Comments)    Other reaction(s): Unknown    Social History:   Social History   Socioeconomic History  . Marital status: Married    Spouse name: AngeOcean SchildtNumber of children: Not on file  . Years of education: Not on file  . Highest education level: Not on file  Occupational History  . Not on file  Tobacco Use  . Smoking status: Never Smoker  . Smokeless tobacco: Never Used  Vaping Use  . Vaping Use: Never used  Substance and Sexual Activity  . Alcohol use: No  . Drug use: No  . Sexual activity: Not Currently  Other Topics Concern  . Not on file  Social History Narrative   Lives locally with wife.  Exercises regularly - gym/golf.  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   Social Determinants of Health   Financial Resource Strain: Not on file  Food Insecurity: Not on file  Transportation Needs: Not on file  Physical Activity: Not on file  Stress: Not on file  Social Connections: Not on file  Intimate Partner Violence: Not on file    Family History:   The patient's family history includes CAD (age of onset: 72) 38 his father.   He indicated that his mother is deceased. He indicated that his father is deceased.  ROS:  Please see the history of present illness.  All other ROS reviewed and negative.     Physical Exam/Data:   Vitals:   01/05/21  1430 01/05/21 1447 01/05/21 1500 01/05/21 1530  BP: 115/70  107/71 (!) 106/57  Pulse: 66  61 (!) 58  Resp: 15  17 19   Temp:      TempSrc:      SpO2: 93%  96% 99%  Weight:  74.7 kg  Height:  5' 9"  (1.753 m)     No intake or output data in the 24 hours ending 01/05/21 1545 Last 3 Weights 01/05/2021 12/29/2020 12/28/2020  Weight (lbs) 164 lb 9.6 oz 164 lb 9.6 oz 161 lb 4.8 oz  Weight (kg) 74.662 kg 74.662 kg 73.165 kg     Body mass index is 24.31 kg/m.  General:  Well nourished, well developed, in mild distress HEENT: normal Lymph: no adenopathy Neck: no JVD Endocrine:  No thryomegaly Vascular: No carotid bruits; 4/4 extremity pulses 2+   Cardiac:  normal S1, S2; RRR; 2-6 murmur  Lungs:  clear to auscultation bilaterally, no wheezing, rhonchi or rales  Abd: soft, nontender, no hepatomegaly  Ext: no edema, +ecchymosis R groin Musculoskeletal:  No deformities, BUE and BLE strength normal and equal Skin: warm and dry  Neuro:  CNs 2-12 intact, no focal abnormalities noted Psych:  Normal affect    EKG:  The ECG that was done 04/19 in PCP office was personally reviewed and demonstrates sinus brady, HR 56, T waves V5-6 are similar to 04/12 ECG, ER ECG is SR, HR 72, Q waves III only, lat T wave abnormalities resolved  Relevant CV Studies: ECHO: 12/25/2020 1. Left ventricular ejection fraction, by estimation, is 50 to 55%. The  left ventricle has low normal function. The left ventricle has no regional  wall motion abnormalities. There is mild left ventricular hypertrophy.  Left ventricular diastolic  parameters are consistent with Grade I diastolic dysfunction (impaired  relaxation).  2. Right ventricular systolic function is normal. The right ventricular  size is normal. Tricuspid regurgitation signal is inadequate for assessing  PA pressure.  3. Left atrial size was moderately dilated.  4. The mitral valve is normal in structure. Trivial mitral valve  regurgitation.  5. The  aortic valve is tricuspid. Aortic valve regurgitation is mild.  Mild aortic valve sclerosis is present, with no evidence of aortic valve  stenosis.  6. The inferior vena cava is normal in size with greater than 50%  respiratory variability, suggesting right atrial pressure of 3 mmHg.   CARDIAC CATH: [facility] 12/24/2020  Ost LAD to Prox LAD lesion is 95% stenosed. Calcification moderate  Prox Cx lesion is 50% stenosed.  Unable to successfully cannulate and engaged right coronary artery which we think is nondominant  Mildly reduced borderline left ventricular function with anterior apical hypokinesis ejection fraction around 45 to 50%   Conclusion Inpatient diagnostic cardiac cath right radial approach.   LV function borderline to mildly depressed EF around 45 to 50% with anterior apical hypokinesis Coronaries Left main large relatively free of disease LAD was large with a complex calcified proximal lesion 95% Circumflex moderate disease but large dominant system Right coronary artery was not successfully cannulated but appears to be nondominant and selective shots Complications Patient is being transferred to [facility] for possible complex intervention of proximal LAD versus CABG Patient's been transferred to [facility]  PCI: [facility] 12/28/2020 1.  Successful PCI of the proximal LAD using a 3.0 x 18 mm resolute Onyx DES 2.  Total occlusion of the native RCA with collateral vessel supplying the mid and distal RCA and codominant PDA branch  Recommend: Resume warfarin today.  Continue aspirin x30 days.  Continue clopidogrel x12 months if tolerated.  Patient will revisit warfarin versus a DOAC drug with his primary care physician, Phillip. Sabra [name].  Patient to follow-up with Phillip. [place] Bigness.  He should be eligible for hospital discharge tomorrow morning. Intervention  Laboratory Data:  High Sensitivity Troponin:   Recent Labs  Lab 12/23/20 1200 12/23/20 1340 12/24/20 0711  12/24/20 1036 01/05/21 1341  TROPONINIHS 192* 637* 668* 543* 366*      Chemistry Recent Labs  Lab 01/05/21 1341  NA 139  K 4.0  CL 107  CO2 27  GLUCOSE 119*  BUN 19  CREATININE 1.45*  CALCIUM 8.7*  GFRNONAA 51*  ANIONGAP 5    Creatinine, Ser  Date Value Ref Range Status  01/05/2021 1.45 (H) 0.61 - 1.24 mg/dL Final  12/29/2020 1.35 (H) 0.61 - 1.24 mg/dL Final  12/28/2020 1.36 (H) 0.61 - 1.24 mg/dL Final  12/27/2020 1.40 (H) 0.61 - 1.24 mg/dL Final   Hepatic Function Latest Ref Rng & Units 10/25/2014  Total Protein 6.4 - 8.2 g/dL 7.0  Albumin 3.4 - 5.0 g/dL 3.2(L)  AST 15 - 37 Unit/L 20  ALT 14 - 63 U/L 22  Alk Phosphatase 46 - 116 Unit/L 58  Total Bilirubin 0.2 - 1.0 mg/dL 0.2   Hematology Recent Labs  Lab 01/05/21 1341  WBC 10.6*  RBC 5.64  HGB 15.4  HCT 50.4  MCV 89.4  MCH 27.3  MCHC 30.6  RDW 15.9*  PLT 436*   BNPNo results for input(s): BNP, PROBNP in the last 168 hours.  DDimer No results for input(s): DDIMER in the last 168 hours. Lab Results  Component Value Date   TSH 7.433 (H) 12/26/2020  No results found for: FREET4    No results found for: HGBA1C Lab Results  Component Value Date   CHOL 174 12/24/2020   HDL 32 (L) 12/24/2020   LDLCALC 119 (H) 12/24/2020   TRIG 113 12/24/2020   CHOLHDL 5.4 12/24/2020   Lab Results  Component Value Date   INR 1.6 (H) 01/05/2021   INR 1.2 12/29/2020   INR 1.1 12/28/2020    Radiology/Studies:  DG Chest 2 View  Result Date: 01/05/2021 CLINICAL DATA:  Chest pain.  Recent MI. EXAM: CHEST - 2 VIEW COMPARISON:  CT 12/23/2020.  Chest x-ray 12/23/2020. FINDINGS: Mediastinum hilar structures normal. Heart size normal. Low lung volumes with mild bibasilar atelectasis. Tiny calcified nodule left upper lung most likely tiny granuloma again noted. No pleural effusion or pneumothorax. Degenerative changes and scoliosis thoracic spine. IMPRESSION: No acute cardiopulmonary disease. Electronically Signed   By: Marcello Moores   Register   On: 01/05/2021 13:37     Assessment and Plan:   1. Chest pain - ez are lower than previous admit, but still elevated -  Pt w/ chronically occluded RCA, some small vessel dz - do not believe the stent is occluded, his trop should be much higher and ECG should be acute - no sig po intake since noon - discuss cath vs other eval w/ MD  2. Dyslipidemia - started on Lipitor 80 mg qd during previous admit  3. Hypothyroid - TSH mildly elevated during prev admit, dose not changed - f/u with PCP, no dose change right now  4. Hx PE - found after surgery for bladder CA - on coumadin, but not therapeutic - start heparin  Otherwise, continue home rx  Risk Assessment/Risk Scores:       :[id number]}   TIMI Risk Score for Unstable Angina or Non-ST Elevation MI:   The patient's TIMI risk score is 6, which indicates a 41% risk of all cause mortality, new or recurrent myocardial infarction or need for urgent revascularization in the next 14 days.{  Severity of Illness: The appropriate patient status  for this patient is INPATIENT. Inpatient status is judged to be reasonable and necessary in order to provide the required intensity of service to ensure the patient's safety. The patient's presenting symptoms, physical exam findings, and initial radiographic and laboratory data in the context of their chronic comorbidities is felt to place them at high risk for further clinical deterioration. Furthermore, it is not anticipated that the patient will be medically stable for discharge from the hospital within 2 midnights of admission. The following factors support the patient status of inpatient.   " The patient's presenting symptoms include Chest pain. " The worrisome physical exam findings include chest pain. " The initial radiographic and laboratory data are worrisome because of elevated troponin. " The chronic co-morbidities include recent NSTEMI.   * I certify that at the point of  admission it is my clinical judgment that the patient will require inpatient hospital care spanning beyond 2 midnights from the point of admission due to high intensity of service, high risk for further deterioration and high frequency of surveillance required.*    For questions or updates, please contact [place] Please consult [email or web address] for contact info under     Signed, Rosaria Ferries, PA-C  01/05/2021 3:45 PM

## 2021-01-05 NOTE — ED Notes (Signed)
Cardiology at bedside.

## 2021-01-05 NOTE — Progress Notes (Signed)
ANTICOAGULATION CONSULT NOTE - Initial Consult  Pharmacy Consult for heparin Indication: chest pain/ACS  Allergies  Allergen Reactions  . Doxazosin Other (See Comments)    Other reaction(s): Unknown    Patient Measurements: Height: 5\' 9"  (175.3 cm) Weight: 74.7 kg (164 lb 9.6 oz) IBW/kg (Calculated) : 70.7 Heparin Dosing Weight: TBW  Vital Signs: Temp: 98.3 F (36.8 C) (04/19 1328) Temp Source: Oral (04/19 1328) BP: 125/74 (04/19 1620) Pulse Rate: 69 (04/19 1620)  Labs: Recent Labs    01/05/21 1341  HGB 15.4  HCT 50.4  PLT 436*  LABPROT 19.3*  INR 1.6*  CREATININE 1.45*  TROPONINIHS 366*    Estimated Creatinine Clearance: 44.7 mL/min (A) (by C-G formula based on SCr of 1.45 mg/dL (H)).   Medical History: Past Medical History:  Diagnosis Date  . Arthritis   . Basal cell carcinoma    L clavicle, L prox forearm- removed years ago   . Bladder cancer ([place])   . Bladder neck contracture   . CAD (coronary artery disease)    a. 12/2020 NSTEMI/Cath: LM nl, LAD 95ost/p, LCX 50p, RCA nl. EF 45-50%.  . Cataract   . CKD (chronic kidney disease), stage III ([place])   . ED (erectile dysfunction)   . Frequency   . GERD (gastroesophageal reflux disease)   . 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 Following LE DVT-->Chronic warfarin.  Marland Kitchen Hyperlipidemia LDL goal <70   . Hypertension   . Hypothyroidism   . Incontinence of urine    sui, s/p cryoablation  . Ischemic cardiomyopathy    a. 11/2018 Echo: EF 60-65%; b. 12/2020 LV Gram: EF 45-50% in setting of NSTEMI.  Marland Kitchen Kidney stones   . Neuropathy    feet  . Nocturia   . Prostate cancer ([place])    S/P   CRYOABLATION  . Right elbow tendinitis   . Right Lower Extremity DVT (deep venous thrombosis) ([place]) 11/25/2018  . Vertigo    1-2x/yr  . Wears glasses    Assessment: 52 YOM presenting with CP, s/p PCI and discharged on 4/12, hx of DVT/PE on warfarin PTA with last dose 4/18, INR on admission subtherapeutic at 1.6.     Goal of Therapy:  Heparin level 0.3-0.7 units/ml Monitor platelets by anticoagulation protocol: Yes   Plan:  Heparin 4000 units IV x 1, and gtt at 900 units/hr F/u 8 hour heparin level F/u cards plan, ability to transition back to [place], PharmD Clinical Pharmacist ED Pharmacist Phone # [phone] 01/05/2021 4:52 PM

## 2021-01-06 ENCOUNTER — Encounter (HOSPITAL_COMMUNITY): Payer: Self-pay | Admitting: Cardiovascular Disease

## 2021-01-06 ENCOUNTER — Encounter (HOSPITAL_COMMUNITY)
Admission: EM | Disposition: A | Discharge status: Home / Self Care | Payer: Self-pay | Source: Home / Self Care | Attending: Cardiology

## 2021-01-06 ENCOUNTER — Other Ambulatory Visit (HOSPITAL_COMMUNITY): Payer: Self-pay

## 2021-01-06 DIAGNOSIS — I214 Non-ST elevation (NSTEMI) myocardial infarction: Secondary | ICD-10-CM

## 2021-01-06 DIAGNOSIS — N1831 Chronic kidney disease, stage 3a: Secondary | ICD-10-CM

## 2021-01-06 DIAGNOSIS — I25110 Atherosclerotic heart disease of native coronary artery with unstable angina pectoris: Principal | ICD-10-CM

## 2021-01-06 DIAGNOSIS — R778 Other specified abnormalities of plasma proteins: Secondary | ICD-10-CM

## 2021-01-06 DIAGNOSIS — Z86711 Personal history of pulmonary embolism: Secondary | ICD-10-CM

## 2021-01-06 HISTORY — PX: LEFT HEART CATH AND CORONARY ANGIOGRAPHY: CATH118249

## 2021-01-06 LAB — COMPREHENSIVE METABOLIC PANEL
ALT: 30 U/L (ref 0–44)
AST: 25 U/L (ref 15–41)
Albumin: 3.2 g/dL — ABNORMAL LOW (ref 3.5–5.0)
Alkaline Phosphatase: 43 U/L (ref 38–126)
Anion gap: 6 (ref 5–15)
BUN: 19 mg/dL (ref 8–23)
CO2: 24 mmol/L (ref 22–32)
Calcium: 8.5 mg/dL — ABNORMAL LOW (ref 8.9–10.3)
Chloride: 108 mmol/L (ref 98–111)
Creatinine, Ser: 1.49 mg/dL — ABNORMAL HIGH (ref 0.61–1.24)
GFR, Estimated: 49 mL/min — ABNORMAL LOW (ref >60)
Glucose, Bld: 123 mg/dL — ABNORMAL HIGH (ref 70–99)
Potassium: 3.9 mmol/L (ref 3.5–5.1)
Sodium: 138 mmol/L (ref 135–145)
Total Bilirubin: 0.9 mg/dL (ref 0.3–1.2)
Total Protein: 5.5 g/dL — ABNORMAL LOW (ref 6.5–8.1)

## 2021-01-06 LAB — CBC
HCT: 45.6 % (ref 39.0–52.0)
Hemoglobin: 14.2 g/dL (ref 13.0–17.0)
MCH: 27.4 pg (ref 26.0–34.0)
MCHC: 31.1 g/dL (ref 30.0–36.0)
MCV: 87.9 fL (ref 80.0–100.0)
Platelets: 371 10*3/uL (ref 150–400)
RBC: 5.19 MIL/uL (ref 4.22–5.81)
RDW: 15.9 % — ABNORMAL HIGH (ref 11.5–15.5)
WBC: 9.4 10*3/uL (ref 4.0–10.5)
nRBC: 0 % (ref 0.0–0.2)

## 2021-01-06 LAB — PROTIME-INR
INR: 1.7 — ABNORMAL HIGH (ref 0.8–1.2)
Prothrombin Time: 19.5 seconds — ABNORMAL HIGH (ref 11.4–15.2)

## 2021-01-06 LAB — HEPARIN LEVEL (UNFRACTIONATED): Heparin Unfractionated: 0.14 IU/mL — ABNORMAL LOW (ref 0.30–0.70)

## 2021-01-06 SURGERY — LEFT HEART CATH AND CORONARY ANGIOGRAPHY
Anesthesia: LOCAL

## 2021-01-06 MED ORDER — FENTANYL CITRATE (PF) 100 MCG/2ML IJ SOLN
INTRAMUSCULAR | Status: DC | PRN
  Administered 2021-01-06: 25 ug via INTRAVENOUS

## 2021-01-06 MED ORDER — ISOSORBIDE MONONITRATE ER 30 MG PO TB24
30.0000 mg | ORAL_TABLET | Freq: Every day | ORAL | Status: DC
  Administered 2021-01-06 – 2021-01-07 (×2): 30 mg via ORAL
  Filled 2021-01-06 (×2): qty 1

## 2021-01-06 MED ORDER — SODIUM CHLORIDE 0.9% FLUSH: 3.0000 mL | INTRAVENOUS | Status: DC | PRN

## 2021-01-06 MED ORDER — SODIUM CHLORIDE 0.9 % IV SOLN: 250.0000 mL | INTRAVENOUS | Status: DC | PRN

## 2021-01-06 MED ORDER — HYDRALAZINE HCL 20 MG/ML IJ SOLN: 10.0000 mg | INTRAMUSCULAR | Status: AC | PRN

## 2021-01-06 MED ORDER — IOHEXOL 350 MG/ML SOLN
INTRAVENOUS | Status: DC | PRN
  Administered 2021-01-06: 65 mL

## 2021-01-06 MED ORDER — ACETAMINOPHEN 325 MG PO TABS: 650.0000 mg | ORAL_TABLET | ORAL | Status: DC | PRN

## 2021-01-06 MED ORDER — LABETALOL HCL 5 MG/ML IV SOLN: 10.0000 mg | INTRAVENOUS | Status: AC | PRN

## 2021-01-06 MED ORDER — HEPARIN (PORCINE) IN NACL 1000-0.9 UT/500ML-% IV SOLN
INTRAVENOUS | Status: DC | PRN
  Administered 2021-01-06 (×2): 500 mL

## 2021-01-06 MED ORDER — FENTANYL CITRATE (PF) 100 MCG/2ML IJ SOLN
INTRAMUSCULAR | Status: AC
  Filled 2021-01-06: qty 2

## 2021-01-06 MED ORDER — HEPARIN BOLUS VIA INFUSION
1500.0000 [IU] | Freq: Once | INTRAVENOUS | Status: AC
  Administered 2021-01-06: 1500 [IU] via INTRAVENOUS
  Filled 2021-01-06: qty 1500

## 2021-01-06 MED ORDER — DIAZEPAM 5 MG PO TABS: 5.0000 mg | ORAL_TABLET | Freq: Four times a day (QID) | ORAL | Status: DC | PRN

## 2021-01-06 MED ORDER — WARFARIN SODIUM 5 MG PO TABS
6.0000 mg | ORAL_TABLET | Freq: Once | ORAL | Status: AC
  Administered 2021-01-06: 6 mg via ORAL
  Filled 2021-01-06: qty 1

## 2021-01-06 MED ORDER — HEPARIN (PORCINE) IN NACL 1000-0.9 UT/500ML-% IV SOLN
INTRAVENOUS | Status: AC
  Filled 2021-01-06: qty 1000

## 2021-01-06 MED ORDER — SODIUM CHLORIDE 0.9 % IV SOLN: INTRAVENOUS | Status: AC

## 2021-01-06 MED ORDER — CLOPIDOGREL BISULFATE 75 MG PO TABS: 75.0000 mg | ORAL_TABLET | Freq: Every day | ORAL | Status: DC

## 2021-01-06 MED ORDER — MIDAZOLAM HCL 2 MG/2ML IJ SOLN
INTRAMUSCULAR | Status: AC
  Filled 2021-01-06: qty 2

## 2021-01-06 MED ORDER — SODIUM CHLORIDE 0.9% FLUSH
3.0000 mL | Freq: Two times a day (BID) | INTRAVENOUS | Status: DC
  Administered 2021-01-06 – 2021-01-07 (×3): 3 mL via INTRAVENOUS

## 2021-01-06 MED ORDER — ONDANSETRON HCL 4 MG/2ML IJ SOLN: 4.0000 mg | Freq: Four times a day (QID) | INTRAMUSCULAR | Status: DC | PRN

## 2021-01-06 MED ORDER — MIDAZOLAM HCL 2 MG/2ML IJ SOLN
INTRAMUSCULAR | Status: DC | PRN
  Administered 2021-01-06: 2 mg via INTRAVENOUS

## 2021-01-06 MED ORDER — LIDOCAINE HCL (PF) 1 % IJ SOLN
INTRAMUSCULAR | Status: DC | PRN
  Administered 2021-01-06: 16 mL

## 2021-01-06 MED ORDER — WARFARIN - PHARMACIST DOSING INPATIENT: Freq: Every day | Status: DC

## 2021-01-06 MED ORDER — ASPIRIN 81 MG PO CHEW: 81.0000 mg | CHEWABLE_TABLET | Freq: Every day | ORAL | Status: DC

## 2021-01-06 MED ORDER — ATORVASTATIN CALCIUM 80 MG PO TABS: 80.0000 mg | ORAL_TABLET | Freq: Every day | ORAL | Status: DC

## 2021-01-06 MED ORDER — LIDOCAINE HCL (PF) 1 % IJ SOLN
INTRAMUSCULAR | Status: AC
  Filled 2021-01-06: qty 30

## 2021-01-06 SURGICAL SUPPLY — 9 items
CATH INFINITI 5FR MULTPACK ANG (CATHETERS) ×2 IMPLANT
CLOSURE MYNX CONTROL 5F (Vascular Products) ×2 IMPLANT
KIT HEART LEFT (KITS) ×2 IMPLANT
PACK CARDIAC CATHETERIZATION (CUSTOM PROCEDURE TRAY) ×2 IMPLANT
SHEATH PINNACLE 5F 10CM (SHEATH) ×2 IMPLANT
SHEATH PROBE COVER 6X72 (BAG) ×2 IMPLANT
TRANSDUCER W/STOPCOCK (MISCELLANEOUS) ×2 IMPLANT
TUBING CIL FLEX 10 FLL-RA (TUBING) ×2 IMPLANT
WIRE EMERALD 3MM-J .035X150CM (WIRE) ×2 IMPLANT

## 2021-01-06 NOTE — Progress Notes (Signed)
Arrived to patients room to place IV and the patient was in transit to the cath lab.

## 2021-01-06 NOTE — Interval H&P Note (Signed)
Cath Lab Visit (complete for each Cath Lab visit)  Clinical Evaluation Leading to the Procedure:   ACS: Yes.    Non-ACS:    Anginal Classification: CCS IV  Anti-ischemic medical therapy: Maximal Therapy (2 or more classes of medications)  Non-Invasive Test Results: No non-invasive testing performed  Prior CABG: No previous CABG      History and Physical Interval Note:  01/06/2021 8:31 AM  Phillip [name]  has presented today for surgery, with the diagnosis of chest pain.  The various methods of treatment have been discussed with the patient and family. After consideration of risks, benefits and other options for treatment, the patient has consented to  Procedure(s): LEFT HEART CATH AND CORONARY ANGIOGRAPHY (N/A) as a surgical intervention.  The patient's history has been reviewed, patient examined, no change in status, stable for surgery.  I have reviewed the patient's chart and labs.  Questions were answered to the patient's satisfaction.     Shelva Majestic

## 2021-01-06 NOTE — Progress Notes (Signed)
[place] for warfarin Indication: chest pain/ACS  Allergies  Allergen Reactions  . Doxazosin Other (See Comments)    Other reaction(s): Unknown    Patient Measurements: Height: 5\' 9"  (175.3 cm) Weight: 73 kg (161 lb) IBW/kg (Calculated) : 70.7 Heparin Dosing Weight: TBW  Vital Signs: Temp: 98 F (36.7 C) (04/20 1011) Temp Source: Oral (04/20 1011) BP: 152/89 (04/20 1011) Pulse Rate: 58 (04/20 1011)  Labs: Recent Labs    01/05/21 1341 01/05/21 1638 01/05/21 2008 01/05/21 2210 01/06/21 0202  HGB 15.4  --   --   --  14.2  HCT 50.4  --   --   --  45.6  PLT 436*  --   --   --  371  LABPROT 19.3*  --   --   --  19.5*  INR 1.6*  --   --   --  1.7*  HEPARINUNFRC  --   --   --   --  0.14*  CREATININE 1.45*  --   --   --  1.49*  TROPONINIHS 366* 538* 729* 776*  --     Estimated Creatinine Clearance: 43.5 mL/min (A) (by C-G formula based on SCr of 1.49 mg/dL (H)).   Assessment: 23 YOM presenting with CP, s/p PCI and discharged on 4/12, hx of DVT/PE on warfarin PTA with last dose 4/18, INR on admission subtherapeutic at 1.6.  He is now s/p cath and to resume warfarin. No heparin bridge since he is close to goal (discussed with Dr. Martinique) -INR= 1.7  Home warfarin dose: 4mg /d except take 2mg  MTh  Goal of Therapy:  INR= 2-3 Monitor platelets by anticoagulation protocol: Yes   Plan:  -Warfarin 6mg  po today -Daily PT/INR  Hildred Laser, PharmD Clinical Pharmacist **Pharmacist phone directory can now be found on [email or web address] ([email or web address]).  Listed under [place].

## 2021-01-06 NOTE — Plan of Care (Signed)

## 2021-01-06 NOTE — Progress Notes (Signed)
Progress Note  Patient Name: Phillip [name] Date of Encounter: 01/06/2021  [facility] Cardiologist: Yolonda Kida, MD   Subjective   Planned for cath today.   Inpatient Medications    Scheduled Meds: . amLODipine  5 mg Oral Daily  . aspirin EC  81 mg Oral Daily  . atorvastatin  80 mg Oral Daily  . cholecalciferol  1,000 Units Oral Daily  . clopidogrel  75 mg Oral Q breakfast  . levothyroxine  50 mcg Oral Q0600  . loratadine  10 mg Oral BID  . metoprolol tartrate  12.5 mg Oral BID  . multivitamin with minerals  1 tablet Oral Daily  . niacin  500 mg Oral QHS  . phosphorus  500 mg Oral Daily  . sodium chloride flush  3 mL Intravenous Q12H   Continuous Infusions: . sodium chloride 75 mL/hr at 01/05/21 2333  . sodium chloride    . sodium chloride    . heparin 1,100 Units/hr (01/06/21 0250)  . nitroGLYCERIN 5 mcg/min (01/05/21 2339)   PRN Meds: sodium chloride, acetaminophen, ALPRAZolam, nitroGLYCERIN, ondansetron (ZOFRAN) IV, sodium chloride flush, zolpidem   Vital Signs    Vitals:   01/06/21 0015 01/06/21 0215 01/06/21 0359 01/06/21 0539  BP: (!) 90/48 (!) 96/52 (!) 96/53 115/71  Pulse: (!) 54 (!) 53 (!) 54 65  Resp: 18  16   Temp: 97.6 F (36.4 C)  97.9 F (36.6 C)   TempSrc: Oral  Oral   SpO2: 97% 96% 97% 98%  Weight:   73 kg   Height:        Intake/Output Summary (Last 24 hours) at 01/06/2021 0811 Last data filed at 01/06/2021 0736 Gross per 24 hour  Intake 548.45 ml  Output 500 ml  Net 48.45 ml   Last 3 Weights 01/06/2021 01/05/2021 01/05/2021  Weight (lbs) 161 lb 161 lb 164 lb 9.6 oz  Weight (kg) 73.029 kg 73.029 kg 74.662 kg      Telemetry    SB - Personally Reviewed  ECG    SB with 1st degree AVB - Personally Reviewed  Physical Exam   GEN: No acute distress.   Neck: No JVD Cardiac: RRR, + murmurs, no rubs, or gallops.  Respiratory: Clear to auscultation bilaterally. GI: Soft, nontender, non-distended  MS: No edema; No  deformity. Right groin ecchymosis Neuro:  Nonfocal  Psych: Normal affect   Labs    High Sensitivity Troponin:   Recent Labs  Lab 12/24/20 1036 01/05/21 1341 01/05/21 1638 01/05/21 2008 01/05/21 2210  TROPONINIHS 543* 366* 538* 729* 776*      Chemistry Recent Labs  Lab 01/05/21 1341 01/06/21 0202  NA 139 138  K 4.0 3.9  CL 107 108  CO2 27 24  GLUCOSE 119* 123*  BUN 19 19  CREATININE 1.45* 1.49*  CALCIUM 8.7* 8.5*  PROT  --  5.5*  ALBUMIN  --  3.2*  AST  --  25  ALT  --  30  ALKPHOS  --  43  BILITOT  --  0.9  GFRNONAA 51* 49*  ANIONGAP 5 6     Hematology Recent Labs  Lab 01/05/21 1341 01/06/21 0202  WBC 10.6* 9.4  RBC 5.64 5.19  HGB 15.4 14.2  HCT 50.4 45.6  MCV 89.4 87.9  MCH 27.3 27.4  MCHC 30.6 31.1  RDW 15.9* 15.9*  PLT 436* 371    BNPNo results for input(s): BNP, PROBNP in the last 168 hours.   DDimer No results  for input(s): DDIMER in the last 168 hours.   Radiology    DG Chest 2 View  Result Date: 01/05/2021 CLINICAL DATA:  Chest pain.  Recent MI. EXAM: CHEST - 2 VIEW COMPARISON:  CT 12/23/2020.  Chest x-ray 12/23/2020. FINDINGS: Mediastinum hilar structures normal. Heart size normal. Low lung volumes with mild bibasilar atelectasis. Tiny calcified nodule left upper lung most likely tiny granuloma again noted. No pleural effusion or pneumothorax. Degenerative changes and scoliosis thoracic spine. IMPRESSION: No acute cardiopulmonary disease. Electronically Signed   By: Marcello Moores  Register   On: 01/05/2021 13:37    Cardiac Studies   N/a   Patient Profile     75 y.o. 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   Assessment & Plan    1. NSTEMI: hsTn up to 776 yesterday. With recent PCI there is concern for compromise of diagonal vessel which was covered by stent or possible edge tear. Planned for repeat cardiac cath today.  INR 1.7 this morning.  -- IV heparin while coumadin is held -- on ASA, plavix, statin, metoprolol and norvasc   2. HLD: placed on atorvastatin 80mg  daily during previous admission -- niacin stopped  3. Hypothyroidism: TSH was mildly elevated previous admission.  -- continue synthroid  4. HTN: stable with current therapy.   5. CKD III: Cr 1.45>>1.49 this morning. Hydrated with plans for cath -- daily BMET  6. Hx of DVT/PE: coumadin held, IV heparin -- INR 1.7  For questions or updates, please contact [place] Please consult [email or web address] for contact info under        Signed, Reino Bellis, NP  01/06/2021, 8:11 AM

## 2021-01-06 NOTE — Progress Notes (Signed)
[place] for heparin Indication: chest pain/ACS  Allergies  Allergen Reactions  . Doxazosin Other (See Comments)    Other reaction(s): Unknown    Patient Measurements: Height: 5\' 9"  (175.3 cm) Weight: 73 kg (161 lb) IBW/kg (Calculated) : 70.7 Heparin Dosing Weight: TBW  Vital Signs: Temp: 97.6 F (36.4 C) (04/20 0015) Temp Source: Oral (04/20 0015) BP: 96/52 (04/20 0215) Pulse Rate: 53 (04/20 0215)  Labs: Recent Labs    01/05/21 1341 01/05/21 1638 01/05/21 2008 01/05/21 2210 01/06/21 0202  HGB 15.4  --   --   --  14.2  HCT 50.4  --   --   --  45.6  PLT 436*  --   --   --  371  LABPROT 19.3*  --   --   --   --   INR 1.6*  --   --   --   --   HEPARINUNFRC  --   --   --   --  0.14*  CREATININE 1.45*  --   --   --   --   TROPONINIHS 366* 538* 729* 776*  --     Estimated Creatinine Clearance: 44.7 mL/min (A) (by C-G formula based on SCr of 1.45 mg/dL (H)).   Assessment: 72 YOM presenting with CP, s/p PCI and discharged on 4/12, hx of DVT/PE on warfarin PTA with last dose 4/18, INR on admission subtherapeutic at 1.6.    Heparin level subtherapeutic (0.14) on gtt at 900 units/hr. No issues with line or bleeding reported per RN.  Goal of Therapy:  Heparin level 0.3-0.7 units/ml Monitor platelets by anticoagulation protocol: Yes   Plan:  Rebolus heparin 1500 units Increase heparin gtt to 1100 units/hr F/u 8 hour heparin level  Sherlon Handing, PharmD, BCPS Please see [email or web address] for complete clinical pharmacist phone list 01/06/2021 2:28 AM

## 2021-01-07 ENCOUNTER — Other Ambulatory Visit (HOSPITAL_COMMUNITY): Payer: Self-pay

## 2021-01-07 LAB — CBC
HCT: 41.3 % (ref 39.0–52.0)
Hemoglobin: 12.6 g/dL — ABNORMAL LOW (ref 13.0–17.0)
MCH: 27.4 pg (ref 26.0–34.0)
MCHC: 30.5 g/dL (ref 30.0–36.0)
MCV: 89.8 fL (ref 80.0–100.0)
Platelets: 374 10*3/uL (ref 150–400)
RBC: 4.6 MIL/uL (ref 4.22–5.81)
RDW: 16.1 % — ABNORMAL HIGH (ref 11.5–15.5)
WBC: 8.5 10*3/uL (ref 4.0–10.5)
nRBC: 0 % (ref 0.0–0.2)

## 2021-01-07 LAB — PROTIME-INR
INR: 1.6 — ABNORMAL HIGH (ref 0.8–1.2)
Prothrombin Time: 18.8 seconds — ABNORMAL HIGH (ref 11.4–15.2)

## 2021-01-07 LAB — PHOSPHORUS: Phosphorus: 3.5 mg/dL (ref 2.5–4.6)

## 2021-01-07 MED ORDER — WARFARIN SODIUM 4 MG PO TABS: 2.0000 mg | ORAL_TABLET | ORAL | Status: DC

## 2021-01-07 MED ORDER — WARFARIN SODIUM 5 MG PO TABS: 6.0000 mg | ORAL_TABLET | Freq: Once | ORAL | Status: DC

## 2021-01-07 MED ORDER — ENOXAPARIN SODIUM 80 MG/0.8ML ~~LOC~~ SOLN
80.0000 mg | Freq: Two times a day (BID) | SUBCUTANEOUS | 0 refills | Status: DC
  Filled 2021-01-07: qty 3.2, 2d supply, fill #0

## 2021-01-07 MED ORDER — ENOXAPARIN SODIUM 80 MG/0.8ML ~~LOC~~ SOLN
80.0000 mg | Freq: Two times a day (BID) | SUBCUTANEOUS | Status: DC
  Administered 2021-01-07: 80 mg via SUBCUTANEOUS
  Filled 2021-01-07: qty 0.8

## 2021-01-07 MED ORDER — ISOSORBIDE MONONITRATE ER 30 MG PO TB24
30.0000 mg | ORAL_TABLET | Freq: Every day | ORAL | 3 refills | Status: DC
  Filled 2021-01-07: qty 90, 90d supply, fill #0

## 2021-01-07 NOTE — Progress Notes (Addendum)
Progress Note  Patient Name: Phillip [name] Date of Encounter: 01/07/2021  [facility] Cardiologist: Yolonda Kida, MD 12/25/20  Subjective   Pt feels well, without chest pain. Ambulated in hall yesterday.  Inpatient Medications    Scheduled Meds: . amLODipine  5 mg Oral Daily  . aspirin EC  81 mg Oral Daily  . atorvastatin  80 mg Oral Daily  . cholecalciferol  1,000 Units Oral Daily  . clopidogrel  75 mg Oral Q breakfast  . isosorbide mononitrate  30 mg Oral Daily  . levothyroxine  50 mcg Oral Q0600  . loratadine  10 mg Oral BID  . metoprolol tartrate  12.5 mg Oral BID  . multivitamin with minerals  1 tablet Oral Daily  . phosphorus  500 mg Oral Daily  . sodium chloride flush  3 mL Intravenous Q12H  . Warfarin - Pharmacist Dosing Inpatient   Does not apply q1600   Continuous Infusions: . sodium chloride 75 mL/hr at 01/05/21 2333  . sodium chloride     PRN Meds: sodium chloride, acetaminophen, ALPRAZolam, nitroGLYCERIN, ondansetron (ZOFRAN) IV, sodium chloride flush, zolpidem   Vital Signs    Vitals:   01/06/21 1518 01/06/21 2100 01/06/21 2336 01/07/21 0436  BP: 116/75 110/67 105/61 107/63  Pulse: 71 70 (!) 50 (!) 52  Resp: 16 16 16 16   Temp: 97.8 F (36.6 C) 98.8 F (37.1 C) 98.6 F (37 C) 98.1 F (36.7 C)  TempSrc: Oral Oral Oral Oral  SpO2: 100% 100%    Weight:    74.7 kg  Height:        Intake/Output Summary (Last 24 hours) at 01/07/2021 0740 Last data filed at 01/07/2021 0437 Gross per 24 hour  Intake 1254.77 ml  Output 885 ml  Net 369.77 ml   Last 3 Weights 01/07/2021 01/06/2021 01/05/2021  Weight (lbs) 164 lb 9.6 oz 161 lb 161 lb  Weight (kg) 74.662 kg 73.029 kg 73.029 kg      Telemetry    Sinus bradycardia with HR 40-60s - Personally Reviewed  ECG    Sinus bradycardia with first degree heart block and HR 52 - Personally Reviewed  Physical Exam   GEN: No acute distress.   Neck: No JVD Cardiac: RRR, no murmurs, rubs, or gallops.   Respiratory: Clear to auscultation bilaterally. GI: Soft, nontender, non-distended  MS: No edema; No deformity. Neuro:  Nonfocal  Psych: Normal affect  Left groin with bruising, no hematoma Right groin with bruising  Labs    High Sensitivity Troponin:   Recent Labs  Lab 12/24/20 1036 01/05/21 1341 01/05/21 1638 01/05/21 2008 01/05/21 2210  TROPONINIHS 543* 366* 538* 729* 776*      Chemistry Recent Labs  Lab 01/05/21 1341 01/06/21 0202  NA 139 138  K 4.0 3.9  CL 107 108  CO2 27 24  GLUCOSE 119* 123*  BUN 19 19  CREATININE 1.45* 1.49*  CALCIUM 8.7* 8.5*  PROT  --  5.5*  ALBUMIN  --  3.2*  AST  --  25  ALT  --  30  ALKPHOS  --  43  BILITOT  --  0.9  GFRNONAA 51* 49*  ANIONGAP 5 6     Hematology Recent Labs  Lab 01/05/21 1341 01/06/21 0202 01/07/21 0300  WBC 10.6* 9.4 8.5  RBC 5.64 5.19 4.60  HGB 15.4 14.2 12.6*  HCT 50.4 45.6 41.3  MCV 89.4 87.9 89.8  MCH 27.3 27.4 27.4  MCHC 30.6 31.1 30.5  RDW  15.9* 15.9* 16.1*  PLT 436* 371 374    BNPNo results for input(s): BNP, PROBNP in the last 168 hours.   DDimer No results for input(s): DDIMER in the last 168 hours.   Radiology    DG Chest 2 View  Result Date: 01/05/2021 CLINICAL DATA:  Chest pain.  Recent MI. EXAM: CHEST - 2 VIEW COMPARISON:  CT 12/23/2020.  Chest x-ray 12/23/2020. FINDINGS: Mediastinum hilar structures normal. Heart size normal. Low lung volumes with mild bibasilar atelectasis. Tiny calcified nodule left upper lung most likely tiny granuloma again noted. No pleural effusion or pneumothorax. Degenerative changes and scoliosis thoracic spine. IMPRESSION: No acute cardiopulmonary disease. Electronically Signed   By: Marcello Moores  Register   On: 01/05/2021 13:37   CARDIAC CATHETERIZATION  Result Date: 01/06/2021  Lat 1st Diag lesion is 90% stenosed.  Dist LAD lesion is 30% stenosed.  Previously placed Prox LAD to Mid LAD stent (unknown type) is widely patent.  Mid LAD lesion is 40%  stenosed.  Ost Cx to Prox Cx lesion is 30% stenosed.  1st LPL lesion is 30% stenosed.  Ost RCA to Prox RCA lesion is 100% stenosed.  LPAV-1 lesion is 40% stenosed.  LPAV-2 lesion is 20% stenosed.  Widely patent proximal LAD stent from recent intervention of December 28, 2020.  Diagonal vessel of the LAD proximal to the stented segment gives off a very small subbranch that has a 90% ostial stenosis and is very small caliber branch vessel, there is smooth 40% narrowing beyond the stented segment in the LAD proximal to a bifurcating diagonal vessel and there is mild 30% mid LAD stenosis. Left circumflex vessel is a dominant vessel that has smooth 30% ostial stenosis.  There is 40% distal AV groove stenosis with 30% narrowing in a distal marginal vessel. Totally occluded proximal RCA with right to right bridging collaterals. RECOMMENDATION: Catheterization does not suggest any stent restenosis or edge dissection.  Patient has been on amlodipine and metoprolol.  Recommend initiation of isosorbide and if ongoing symptoms possible ranolazine.  I reviewed the images with Dr. Martinique.  Continue triple drug therapy for 30 days following initial stent with plans for Plavix/warfarin discontinuance of aspirin at 30 days.    Cardiac Studies   Left heart cath 01/06/21:  Lat 1st Diag lesion is 90% stenosed.  Dist LAD lesion is 30% stenosed.  Previously placed Prox LAD to Mid LAD stent (unknown type) is widely patent.  Mid LAD lesion is 40% stenosed.  Ost Cx to Prox Cx lesion is 30% stenosed.  1st LPL lesion is 30% stenosed.  Ost RCA to Prox RCA lesion is 100% stenosed.  LPAV-1 lesion is 40% stenosed.  LPAV-2 lesion is 20% stenosed.   Widely patent proximal LAD stent from recent intervention of December 28, 2020.  Diagonal vessel of the LAD proximal to the stented segment gives off a very small subbranch that has a 90% ostial stenosis and is very small caliber branch vessel, there is smooth 40% narrowing beyond  the stented segment in the LAD proximal to a bifurcating diagonal vessel and there is mild 30% mid LAD stenosis.  Left circumflex vessel is a dominant vessel that has smooth 30% ostial stenosis.  There is 40% distal AV groove stenosis with 30% narrowing in a distal marginal vessel.  Totally occluded proximal RCA with right to right bridging collaterals.  RECOMMENDATION: Catheterization does not suggest any stent restenosis or edge dissection.  Patient has been on amlodipine and metoprolol.  Recommend initiation of isosorbide  and if ongoing symptoms possible ranolazine.  I reviewed the images with Dr. Martinique.  Continue triple drug therapy for 30 days following initial stent with plans for Plavix/warfarin discontinuance of aspirin at 30 days.   Left heart catheterization 12/24/20:  Ost LAD to Prox LAD lesion is 95% stenosed. Calcification moderate  Prox Cx lesion is 50% stenosed.  Unable to successfully cannulate and engaged right coronary artery which we think is nondominant  Mildly reduced borderline left ventricular function with anterior apical hypokinesis ejection fraction around 45 to 50%  Conclusion Inpatient diagnostic cardiac cath right radial approach.  LV function borderline to mildly depressed EF around 45 to 50% with anterior apical hypokinesis Coronaries Left main large relatively free of disease LAD was large with a complex calcified proximal lesion 95% Circumflex moderate disease but large dominant system Right coronary artery was not successfully cannulated but appears to be nondominant and selective shots Complications Patient is being transferred to [facility] for possible complex intervention of proximal LAD versus CABG Patient's been transferred to [facility]   Echo 12/25/20: 1. Left ventricular ejection fraction, by estimation, is 50 to 55%. The  left ventricle has low normal function. The left ventricle has no regional  wall motion abnormalities. There  is mild left ventricular hypertrophy.  Left ventricular diastolic  parameters are consistent with Grade I diastolic dysfunction (impaired  relaxation).  2. Right ventricular systolic function is normal. The right ventricular  size is normal. Tricuspid regurgitation signal is inadequate for assessing  PA pressure.  3. Left atrial size was moderately dilated.  4. The mitral valve is normal in structure. Trivial mitral valve  regurgitation.  5. The aortic valve is tricuspid. Aortic valve regurgitation is mild.  Mild aortic valve sclerosis is present, with no evidence of aortic valve  stenosis.  6. The inferior vena cava is normal in size with greater than 50%  respiratory variability, suggesting right atrial pressure of 3 mmHg.   Coronary stent intervention 12/28/20:  Ost LAD to Prox LAD lesion is 95% stenosed. Calcification moderate  Prox Cx lesion is 50% stenosed.  Unable to successfully cannulate and engaged right coronary artery which we think is nondominant  Mildly reduced borderline left ventricular function with anterior apical hypokinesis ejection fraction around 45 to 50%  Conclusion Inpatient diagnostic cardiac cath right radial approach.  LV function borderline to mildly depressed EF around 45 to 50% with anterior apical hypokinesis Coronaries Left main large relatively free of disease LAD was large with a complex calcified proximal lesion 95% Circumflex moderate disease but large dominant system Right coronary artery was not successfully cannulated but appears to be nondominant and selective shots Complications Patient is being transferred to [facility] for possible complex intervention of proximal LAD versus CABG Patient's been transferred to [facility]  Patient Profile     75 y.o. male with a history of HTN, prostate cancer, bladder cancer, DVT, HLD, CKD, and CAD. He was admitted for NSTEMI at [facility] 4/6-4/8 then transferred to [facility] for DES to LAD and  discharged on 12/29/20. He had chest pain on 4/17 but was able to sleep that night. He underwent cystoscopy on 4/18 without cardiac complications.  Unfortunately he returned to [facility] with chest pain.   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 HS troponin 366 --> 539 --> 729 --> 776. He was taken back to cath lab yesterday 01/06/21 for re-look given recurrence  of chest pain. Angiography did not show stent restenosis or edge dissection, remaining disease stable/no change. He is maintained on ASA and plavix. He is already on 5 mg amlodipine and 12.5 mg metoprolol BID. Imdur 30 mg was started. Continue with plan for triple therapy for 30 days then discontinued ASA and continue plavix and warfarin. Question spasm.   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  Coumadin was held for initial heart cath. INR today was 1.6. CrCl 43.5 with sCr 1.49.  Was discharged from prior hospitalization with lovenox (poor coverage for lovenox).    Hyperlipidemia with LDL goal < 70 12/24/2020: Cholesterol 174; HDL 32; LDL Cholesterol 119; Triglycerides 113; VLDL 23 Started on 80 mg lipitor.  Recheck lipids in 6 weeks.   CKD stage III Creatinine at baseline following heart cath.     For questions or updates, please contact [place] Please consult [email or web address] for contact info under        Signed, Ledora Bottcher, PA  01/07/2021, 7:40 AM

## 2021-01-07 NOTE — Progress Notes (Addendum)
[place] for warfarin Indication: chest pain/ACS  Allergies  Allergen Reactions  . Doxazosin Other (See Comments)    Other reaction(s): Unknown    Patient Measurements: Height: 5\' 9"  (175.3 cm) Weight: 74.7 kg (164 lb 9.6 oz) IBW/kg (Calculated) : 70.7 Heparin Dosing Weight: TBW  Vital Signs: Temp: 98.1 F (36.7 C) (04/21 0436) Temp Source: Oral (04/21 0436) BP: 107/63 (04/21 0436) Pulse Rate: 52 (04/21 0436)  Labs: Recent Labs    01/05/21 1341 01/05/21 1638 01/05/21 2008 01/05/21 2210 01/06/21 0202 01/07/21 0300  HGB 15.4  --   --   --  14.2 12.6*  HCT 50.4  --   --   --  45.6 41.3  PLT 436*  --   --   --  371 374  LABPROT 19.3*  --   --   --  19.5* 18.8*  INR 1.6*  --   --   --  1.7* 1.6*  HEPARINUNFRC  --   --   --   --  0.14*  --   CREATININE 1.45*  --   --   --  1.49*  --   TROPONINIHS 366* 538* 729* 776*  --   --     Estimated Creatinine Clearance: 43.5 mL/min (A) (by C-G formula based on SCr of 1.49 mg/dL (H)).   Assessment: 65 YOM presenting with CP, s/p PCI and discharged on 4/12, hx of DVT/PE on warfarin PTA with last dose 4/18, INR on admission subtherapeutic at 1.6.  He is now s/p cath and warfarin restarted 4/20.  -INR= 1.6  Home warfarin dose: 4mg /d except take 2mg  MTh  Goal of Therapy:  INR= 2-3 Monitor platelets by anticoagulation protocol: Yes   Plan:  -Warfarin 6mg  po todayr -Daily PT/INR  Hildred Laser, PharmD Clinical Pharmacist **Pharmacist phone directory can now be found on [email or web address] ([email or web address]).  Listed under [place].

## 2021-01-07 NOTE — Discharge Summary (Signed)
Discharge Summary    Patient ID: Phillip [name] MRN: [id number]; DOB: 1946/01/27  Admit date: 01/05/2021 Discharge date: 01/07/2021  PCP:  Rusty Aus, MD   [place]  Cardiologist:  Yolonda Kida, MD    Discharge Diagnoses    Principal Problem:   Unstable angina [facility]) Active Problems:   Pulmonary emboli (HCC)   GERD (gastroesophageal reflux disease)   HTN (hypertension)   HLD (hyperlipidemia)   DM type 2 with diabetic mixed hyperlipidemia ([place])   Hypercoagulable state ([place])   NSTEMI (non-ST elevated myocardial infarction) ([place])   CKD (chronic kidney disease), stage III ([place])   CAD (coronary artery disease)   Elevated troponin    Diagnostic Studies/Procedures    Left heart cath 01/06/21:  Lat 1st Diag lesion is 90% stenosed.  Dist LAD lesion is 30% stenosed.  Previously placed Prox LAD to Mid LAD stent (unknown type) is widely patent.  Mid LAD lesion is 40% stenosed.  Ost Cx to Prox Cx lesion is 30% stenosed.  1st LPL lesion is 30% stenosed.  Ost RCA to Prox RCA lesion is 100% stenosed.  LPAV-1 lesion is 40% stenosed.  LPAV-2 lesion is 20% stenosed.  Widely patent proximal LAD stent from recent intervention of December 28, 2020. Diagonal vessel of the LAD proximal to the stented segment gives off a very small subbranch that has a 90% ostial stenosis and is very small caliber branch vessel, there is smooth 40% narrowing beyond the stented segment in the LAD proximal to a bifurcating diagonal vessel and there is mild 30% mid LAD stenosis.  Left circumflex vessel is a dominant vessel that has smooth 30% ostial stenosis. There is 40% distal AV groove stenosis with 30% narrowing in a distal marginal vessel.  Totally occluded proximal RCA with right to right bridging collaterals.  RECOMMENDATION: Catheterization does not suggest any stent restenosis or edge dissection. Patient has been on amlodipine and metoprolol. Recommend  initiation of isosorbide and if ongoing symptoms possible ranolazine. I reviewed the images with Dr. Martinique. Continue triple drug therapy for 30 days following initial stent with plans for Plavix/warfarin discontinuance of aspirin at 30 days.   Left heart catheterization 12/24/20:  Ost LAD to Prox LAD lesion is 95% stenosed. Calcification moderate  Prox Cx lesion is 50% stenosed.  Unable to successfully cannulate and engaged right coronary artery which we think is nondominant  Mildly reduced borderline left ventricular function with anterior apical hypokinesis ejection fraction around 45 to 50%  Conclusion Inpatient diagnostic cardiac cath right radial approach.  LV function borderline to mildly depressed EF around 45 to 50% with anterior apical hypokinesis Coronaries Left main large relatively free of disease LAD was large with a complex calcified proximal lesion 95% Circumflex moderate disease but large dominant system Right coronary artery was not successfully cannulated but appears to be nondominant and selective shots Complications Patient is being transferred to [facility] for possible complex intervention of proximal LAD versus CABG Patient's been transferred to [facility]   Echo 12/25/20: 1. Left ventricular ejection fraction, by estimation, is 50 to 55%. The  left ventricle has low normal function. The left ventricle has no regional  wall motion abnormalities. There is mild left ventricular hypertrophy.  Left ventricular diastolic  parameters are consistent with Grade I diastolic dysfunction (impaired  relaxation).  2. Right ventricular systolic function is normal. The right ventricular  size is normal. Tricuspid regurgitation signal is inadequate for assessing  PA pressure.  3. Left  atrial size was moderately dilated.  4. The mitral valve is normal in structure. Trivial mitral valve  regurgitation.  5. The aortic valve is tricuspid. Aortic valve  regurgitation is mild.  Mild aortic valve sclerosis is present, with no evidence of aortic valve  stenosis.  6. The inferior vena cava is normal in size with greater than 50%  respiratory variability, suggesting right atrial pressure of 3 mmHg.   Coronary stent intervention 12/28/20:  Ost LAD to Prox LAD lesion is 95% stenosed. Calcification moderate  Prox Cx lesion is 50% stenosed.  Unable to successfully cannulate and engaged right coronary artery which we think is nondominant  Mildly reduced borderline left ventricular function with anterior apical hypokinesis ejection fraction around 45 to 50%  Conclusion Inpatient diagnostic cardiac cath right radial approach.  LV function borderline to mildly depressed EF around 45 to 50% with anterior apical hypokinesis Coronaries Left main large relatively free of disease LAD was large with a complex calcified proximal lesion 95% Circumflex moderate disease but large dominant system Right coronary artery was not successfully cannulated but appears to be nondominant and selective shots Complications Patient is being transferred to [facility] for possible complex intervention of proximal LAD versus CABG Patient's been transferred to [facility] _____________   History of Present Illness     Phillip [name] is a 75 y.o. male with a history of HTN, prostate cancer, bladder cancer, DVT, HLD, CKD, and CAD. He was admitted for NSTEMI at [facility] 4/6-4/8 then transferred to [facility] for DES to LAD and discharged on 12/29/20. Nondominant RCA was occluded proximally with bridging right to righ collaterals. Disease in a small branch of the diagonal.  He had chest pain on 4/17 but was able to sleep that night. He underwent cystoscopy on 4/18 without cardiac complications.  Unfortunately he returned to [facility] with chest pain.   Hospital Course     Consultants: none  Chest pain CAD s/p DES to LAD (12/2020) HS troponin at discharge on 12/24/20 was 543. HS  troponin 366 --> 539 --> 729 --> 776. He was taken back to cath lab yesterday 01/06/21 for re-look given recurrence of chest pain. Angiography did not show stent restenosis or edge dissection, remaining disease stable/no change. He is maintained on ASA and plavix. He is already on 5 mg amlodipine and 12.5 mg metoprolol BID. Imdur 30 mg was started. Continue with plan for triple therapy for 30 days then discontinued ASA and continue plavix and warfarin. Question spasm vs angina from moderate disease in LAD after D2.    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 Was discharged previously on lovenox bridge. He has some coverage issues. Will cover with lovenox for three days with INR appt on Monday. INR 1.6 today, CrCl 43.5 with sCr 1.49. I have instructed him to take 4 mg coumadin tonight instead of 2 mg as scheduled.   Hyperlipidemia with LDL goal < 70 12/24/2020: Cholesterol 174; HDL 32; LDL Cholesterol 119; Triglycerides 113; VLDL 23 Started 80 mg lipitor. Recheck lipids in 6 weeks.    CKD stage III Creatinine at baseline following angiography.   Pt seen and examined by Dr. Martinique and deemed stable for discharge. I have arranged INR follow up. He already has follow up with cardiology with Jefm Bryant next week.   Did the patient have an acute coronary syndrome (MI, NSTEMI, STEMI, etc) this admission?:  Yes  AHA/ACC Clinical Performance & Quality Measures: 1. Aspirin prescribed? - Yes 2. ADP Receptor Inhibitor (Plavix/Clopidogrel, Brilinta/Ticagrelor or Effient/Prasugrel) prescribed (includes medically managed patients)? - Yes 3. Beta Blocker prescribed? - Yes 4. High Intensity Statin (Lipitor 40-80mg  or Crestor 20-40mg ) prescribed? - Yes 5. EF assessed during THIS hospitalization? - No - assess during recent hospitalization 6. For EF <40%, was ACEI/ARB prescribed? - Not Applicable (EF >/= 71%) 7. For EF <40%, Aldosterone Antagonist (Spironolactone or  Eplerenone) prescribed? - Not Applicable (EF >/= 69%) 8. Cardiac Rehab Phase II ordered (including medically managed patients)? - Yes       _____________  Discharge Vitals Blood pressure 107/63, pulse 65, temperature 98.1 F (36.7 C), temperature source Oral, resp. rate 16, height 5\' 9"  (1.753 m), weight 74.7 kg, SpO2 100 %.  Filed Weights   01/05/21 2104 01/06/21 0359 01/07/21 0436  Weight: 73 kg 73 kg 74.7 kg    Labs & Radiologic Studies    CBC Recent Labs    01/06/21 0202 01/07/21 0300  WBC 9.4 8.5  HGB 14.2 12.6*  HCT 45.6 41.3  MCV 87.9 89.8  PLT 371 678   Basic Metabolic Panel Recent Labs    01/05/21 1341 01/06/21 0202 01/07/21 0300  NA 139 138  --   K 4.0 3.9  --   CL 107 108  --   CO2 27 24  --   GLUCOSE 119* 123*  --   BUN 19 19  --   CREATININE 1.45* 1.49*  --   CALCIUM 8.7* 8.5*  --   PHOS  --   --  3.5   Liver Function Tests Recent Labs    01/06/21 0202  AST 25  ALT 30  ALKPHOS 43  BILITOT 0.9  PROT 5.5*  ALBUMIN 3.2*   No results for input(s): LIPASE, AMYLASE in the last 72 hours. High Sensitivity Troponin:   Recent Labs  Lab 12/24/20 1036 01/05/21 1341 01/05/21 1638 01/05/21 2008 01/05/21 2210  TROPONINIHS 543* 366* 538* 729* 776*    BNP Invalid input(s): POCBNP D-Dimer No results for input(s): DDIMER in the last 72 hours. Hemoglobin A1C No results for input(s): HGBA1C in the last 72 hours. Fasting Lipid Panel No results for input(s): CHOL, HDL, LDLCALC, TRIG, CHOLHDL, LDLDIRECT in the last 72 hours. Thyroid Function Tests No results for input(s): TSH, T4TOTAL, T3FREE, THYROIDAB in the last 72 hours.  Invalid input(s): FREET3 _____________  DG Chest 2 View  Result Date: 01/05/2021 CLINICAL DATA:  Chest pain.  Recent MI. EXAM: CHEST - 2 VIEW COMPARISON:  CT 12/23/2020.  Chest x-ray 12/23/2020. FINDINGS: Mediastinum hilar structures normal. Heart size normal. Low lung volumes with mild bibasilar atelectasis. Tiny calcified  nodule left upper lung most likely tiny granuloma again noted. No pleural effusion or pneumothorax. Degenerative changes and scoliosis thoracic spine. IMPRESSION: No acute cardiopulmonary disease. Electronically Signed   By: Marcello Moores  Register   On: 01/05/2021 13:37   DG Chest 2 View  Result Date: 12/23/2020 CLINICAL DATA:  Chest pain EXAM: CHEST - 2 VIEW COMPARISON:  None. FINDINGS: The heart size and mediastinal contours are within normal limits. Both lungs are clear. The visualized skeletal structures are unremarkable. IMPRESSION: No active cardiopulmonary disease. Electronically Signed   By: Franchot Gallo M.D.   On: 12/23/2020 12:54   CT Angio Chest PE W/Cm &/Or Wo Cm  Result Date: 12/23/2020 CLINICAL DATA:  Suspected pulmonary embolism, high probability in a 75 year old male. EXAM: CT ANGIOGRAPHY CHEST WITH CONTRAST TECHNIQUE: Multidetector CT imaging  of the chest was performed using the standard protocol during bolus administration of intravenous contrast. Multiplanar CT image reconstructions and MIPs were obtained to evaluate the vascular anatomy. CONTRAST:  75mL OMNIPAQUE IOHEXOL 350 MG/ML SOLN COMPARISON:  December 07, 2018 FINDINGS: Cardiovascular: Mild cardiac enlargement. No pericardial effusion. Three-vessel coronary artery calcification. Mild-to-moderate generalized aortic atherosclerosis without aneurysm. Normal caliber central pulmonary vessels. Negative for acute pulmonary embolism. Mediastinum/Nodes: Thoracic inlet structures are normal. No axillary lymphadenopathy. No mediastinal lymphadenopathy. No hilar lymphadenopathy. Esophagus grossly normal. Lungs/Pleura: Basilar atelectasis. Mild air trapping. No consolidation. No pleural effusion. Upper Abdomen: Low-density splenic lesions are similar to the study ofm July of 2020. 1.3 cm lesion with low-density in the spleen at the periphery measuring water density on image 80 of series 4 at 13 mm as compared to 12 mm on the previous study. Smaller  lesion seen inferior to this may have enlarged slightly but also measures water density. Imaged portions of the liver, gallbladder, pancreas and adrenal glands are normal. Musculoskeletal: No acute bone finding. No destructive bone process. Spinal degenerative changes. Review of the MIP images confirms the above findings. IMPRESSION: 1. Negative for acute pulmonary embolism. 2. Atelectasis and mild air trapping, could reflect small airways disease. 3. Mild-to-moderate generalized aortic atherosclerosis without aneurysm. Three-vessel coronary artery calcification. 4. Low-density splenic lesions are similar to the study of July of 2020. One of these may have enlarged slightly but also measures water density. Findings are likely small cysts. 5. Aortic atherosclerosis. Aortic Atherosclerosis (ICD10-I70.0). Electronically Signed   By: Zetta Bills M.D.   On: 12/23/2020 15:06   CARDIAC CATHETERIZATION  Result Date: 01/06/2021  Lat 1st Diag lesion is 90% stenosed.  Dist LAD lesion is 30% stenosed.  Previously placed Prox LAD to Mid LAD stent (unknown type) is widely patent.  Mid LAD lesion is 40% stenosed.  Ost Cx to Prox Cx lesion is 30% stenosed.  1st LPL lesion is 30% stenosed.  Ost RCA to Prox RCA lesion is 100% stenosed.  LPAV-1 lesion is 40% stenosed.  LPAV-2 lesion is 20% stenosed.  Widely patent proximal LAD stent from recent intervention of December 28, 2020.  Diagonal vessel of the LAD proximal to the stented segment gives off a very small subbranch that has a 90% ostial stenosis and is very small caliber branch vessel, there is smooth 40% narrowing beyond the stented segment in the LAD proximal to a bifurcating diagonal vessel and there is mild 30% mid LAD stenosis. Left circumflex vessel is a dominant vessel that has smooth 30% ostial stenosis.  There is 40% distal AV groove stenosis with 30% narrowing in a distal marginal vessel. Totally occluded proximal RCA with right to right bridging  collaterals. RECOMMENDATION: Catheterization does not suggest any stent restenosis or edge dissection.  Patient has been on amlodipine and metoprolol.  Recommend initiation of isosorbide and if ongoing symptoms possible ranolazine.  I reviewed the images with Dr. Martinique.  Continue triple drug therapy for 30 days following initial stent with plans for Plavix/warfarin discontinuance of aspirin at 30 days.   CARDIAC CATHETERIZATION  Result Date: 12/28/2020 1.  Successful PCI of the proximal LAD using a 3.0 x 18 mm resolute Onyx DES 2.  Total occlusion of the native RCA with collateral vessel supplying the mid and distal RCA and codominant PDA branch Recommend: Resume warfarin today.  Continue aspirin x30 days.  Continue clopidogrel x12 months if tolerated.  Patient will revisit warfarin versus a DOAC drug with his primary care physician, Dr.  Miller.  Patient to follow-up with Dr. Clayborn Bigness.  He should be eligible for hospital discharge tomorrow morning.  CARDIAC CATHETERIZATION  Result Date: 12/25/2020  Ost LAD to Prox LAD lesion is 95% stenosed. Calcification moderate  Prox Cx lesion is 50% stenosed.  Unable to successfully cannulate and engaged right coronary artery which we think is nondominant  Mildly reduced borderline left ventricular function with anterior apical hypokinesis ejection fraction around 45 to 50%  Conclusion Inpatient diagnostic cardiac cath right radial approach.  LV function borderline to mildly depressed EF around 45 to 50% with anterior apical hypokinesis Coronaries Left main large relatively free of disease LAD was large with a complex calcified proximal lesion 95% Circumflex moderate disease but large dominant system Right coronary artery was not successfully cannulated but appears to be nondominant and selective shots Complications Patient is being transferred to [facility] for possible complex intervention of proximal LAD versus CABG Patient's been transferred to [facility]    ECHOCARDIOGRAM COMPLETE  Result Date: 12/25/2020    ECHOCARDIOGRAM REPORT   Patient Name:   Phillip [name] Date of Exam: 12/25/2020 Medical Rec #:  [id number]        Height:       69.0 in Accession #:    [id number]       Weight:       166.0 lb Date of Birth:  08-21-1946        BSA:          1.909 m Patient Age:    60 years         BP:           150/84 mmHg Patient Gender: M                HR:           62 bpm. Exam Location:  Inpatient Procedure: 2D Echo, Cardiac Doppler and Color Doppler Indications:    R07.9* Chest pain, unspecified  History:        Patient has prior history of Echocardiogram examinations, most                 recent 12/08/2018. Acute MI and CAD; Risk Factors:Dyslipidemia.  Sonographer:    Merrie Roof Referring Phys: [place]  1. Left ventricular ejection fraction, by estimation, is 50 to 55%. The left ventricle has low normal function. The left ventricle has no regional wall motion abnormalities. There is mild left ventricular hypertrophy. Left ventricular diastolic parameters are consistent with Grade I diastolic dysfunction (impaired relaxation).  2. Right ventricular systolic function is normal. The right ventricular size is normal. Tricuspid regurgitation signal is inadequate for assessing PA pressure.  3. Left atrial size was moderately dilated.  4. The mitral valve is normal in structure. Trivial mitral valve regurgitation.  5. The aortic valve is tricuspid. Aortic valve regurgitation is mild. Mild aortic valve sclerosis is present, with no evidence of aortic valve stenosis.  6. The inferior vena cava is normal in size with greater than 50% respiratory variability, suggesting right atrial pressure of 3 mmHg. FINDINGS  Left Ventricle: Left ventricular ejection fraction, by estimation, is 50 to 55%. The left ventricle has low normal function. The left ventricle has no regional wall motion abnormalities. The left ventricular internal cavity size was normal  in size. There is mild left ventricular hypertrophy. Left ventricular diastolic parameters are consistent with Grade I diastolic dysfunction (impaired relaxation). Right Ventricle: The right ventricular size is normal. No increase  in right ventricular wall thickness. Right ventricular systolic function is normal. Tricuspid regurgitation signal is inadequate for assessing PA pressure. Left Atrium: Left atrial size was moderately dilated. Right Atrium: Right atrial size was normal in size. Pericardium: There is no evidence of pericardial effusion. Mitral Valve: The mitral valve is normal in structure. Trivial mitral valve regurgitation. Tricuspid Valve: The tricuspid valve is normal in structure. Tricuspid valve regurgitation is trivial. Aortic Valve: The aortic valve is tricuspid. Aortic valve regurgitation is mild. Mild aortic valve sclerosis is present, with no evidence of aortic valve stenosis. Aortic valve mean gradient measures 7.0 mmHg. Aortic valve peak gradient measures 11.8 mmHg. Aortic valve area, by VTI measures 1.82 cm. Pulmonic Valve: The pulmonic valve was not well visualized. Pulmonic valve regurgitation is not visualized. Aorta: The aortic root and ascending aorta are structurally normal, with no evidence of dilitation. Venous: The inferior vena cava is normal in size with greater than 50% respiratory variability, suggesting right atrial pressure of 3 mmHg. IAS/Shunts: No atrial level shunt detected by color flow Doppler.  LEFT VENTRICLE PLAX 2D LVIDd:         4.60 cm  Diastology LVIDs:         3.40 cm  LV e' medial:    4.68 cm/s LV PW:         1.10 cm  LV E/e' medial:  9.4 LV IVS:        1.00 cm  LV e' lateral:   3.48 cm/s LVOT diam:     2.10 cm  LV E/e' lateral: 12.7 LV SV:         61 LV SV Index:   32 LVOT Area:     3.46 cm  RIGHT VENTRICLE RV Basal diam:  3.90 cm LEFT ATRIUM             Index       RIGHT ATRIUM           Index LA diam:        4.40 cm 2.31 cm/m  RA Area:     17.80 cm LA Vol  (A2C):   91.8 ml 48.10 ml/m RA Volume:   37.80 ml  19.80 ml/m LA Vol (A4C):   90.8 ml 47.57 ml/m LA Biplane Vol: 92.4 ml 48.41 ml/m  AORTIC VALVE AV Area (Vmax):    1.77 cm AV Area (Vmean):   1.87 cm AV Area (VTI):     1.82 cm AV Vmax:           172.00 cm/s AV Vmean:          123.000 cm/s AV VTI:            0.337 m AV Peak Grad:      11.8 mmHg AV Mean Grad:      7.0 mmHg LVOT Vmax:         88.10 cm/s LVOT Vmean:        66.300 cm/s LVOT VTI:          0.177 m LVOT/AV VTI ratio: 0.53  AORTA Ao Root diam: 3.80 cm Ao Asc diam:  3.70 cm MITRAL VALVE MV Area (PHT): 3.85 cm    SHUNTS MV Decel Time: 197 msec    Systemic VTI:  0.18 m MV E velocity: 44.10 cm/s  Systemic Diam: 2.10 cm MV A velocity: 83.10 cm/s MV E/A ratio:  0.53 Oswaldo Milian MD Electronically signed by Oswaldo Milian MD Signature Date/Time: 12/25/2020/9:52:39 PM    Final  Disposition   Pt is being discharged home today in good condition.  Follow-up Plans & Appointments     Follow-up Information    Rusty Aus, MD Follow up on 01/11/2021.   Specialty: Internal Medicine Why: INR check Contact information: [place] [address] [phone]        Yolonda Kida, MD Follow up on 01/08/2021.   Specialties: Cardiology, Internal Medicine Contact information: [place] [address] [phone]              Discharge Instructions    Diet - low sodium heart healthy   Complete by: As directed    Discharge instructions   Complete by: As directed    No driving for 1 week. No lifting over 5 lbs for 1 week. No sexual activity for 1 week. Keep procedure site clean & dry. If you notice increased pain, swelling, bleeding or pus, call/return!  You may shower, but no soaking baths/hot tubs/pools for 1 week.   Increase activity slowly   Complete by: As directed       Discharge Medications   Allergies as of 01/07/2021       Reactions   Doxazosin Other (See Comments)   Other reaction(s): Unknown      Medication List    TAKE these medications   acetaminophen 500 MG tablet Commonly known as: TYLENOL Take 500 mg by mouth every 6 (six) hours as needed.   amLODipine 5 MG tablet Commonly known as: NORVASC Take 5 mg by mouth daily.   Aspirin Low Dose 81 MG EC tablet Generic drug: aspirin Take 1 tablet (81 mg total) by mouth daily. Swallow whole.   atorvastatin 80 MG tablet Commonly known as: LIPITOR Take 1 tablet (80 mg total) by mouth daily.   cholecalciferol 25 MCG (1000 UNIT) tablet Commonly known as: VITAMIN D3 Take 1,000 Units by mouth daily.   Chromium 1 MG Caps Take 1 mg by mouth daily. 1 per day   clopidogrel 75 MG tablet Commonly known as: PLAVIX Take 1 tablet (75 mg total) by mouth daily with breakfast.   enoxaparin 80 MG/0.8ML injection Commonly known as: LOVENOX Inject 0.8 mLs (80 mg total) into the skin every 12 (twelve) hours.   GLUCOSAMINE CHONDR 1500 COMPLX PO Take 1 tablet by mouth every evening.   isosorbide mononitrate 30 MG 24 hr tablet Commonly known as: IMDUR Take 1 tablet (30 mg total) by mouth daily. Start taking on: January 08, 2021   levothyroxine 50 MCG tablet Commonly known as: SYNTHROID Take 50 mcg by mouth.   loratadine 10 MG tablet Commonly known as: CLARITIN Take 10 mg by mouth in the morning and at bedtime.   metoprolol tartrate 25 MG tablet Commonly known as: LOPRESSOR Take 0.5 tablets (12.5 mg total) by mouth 2 (two) times daily.   multivitamin tablet Take 1 tablet by mouth daily.   niacin 500 MG tablet Take 500 mg by mouth at bedtime.   nitroGLYCERIN 0.4 MG SL tablet Commonly known as: NITROSTAT Place 1 tablet (0.4 mg total) under the tongue every 5 (five) minutes x 3 doses as needed for chest pain.   pantoprazole 40 MG tablet Commonly known as: PROTONIX Take 1 tablet (40 mg total) by mouth daily.   potassium phosphate (monobasic) 500 MG  tablet Commonly known as: K-PHOS ORIGINAL Take 500 mg by mouth daily.   TURMERIC PO Take 1 tablet by mouth daily.   warfarin 4  MG tablet Commonly known as: Coumadin Take 0.5-1 tablets (2-4 mg total) by mouth See admin instructions. Take 4mg  daily by mouth with the exception of 2mg  daily by mouth on Mondays and Thursdays. Take 4 mg 01/07/21, then resume regular schedule. What changed:   how much to take  how to take this  when to take this  additional instructions          Outstanding Labs/Studies   INR on Monday  Duration of Discharge Encounter   Greater than 30 minutes including physician time.  Signed, Tami Lin Fount Bahe, PA 01/07/2021, 9:16 AM

## 2021-01-07 NOTE — Progress Notes (Signed)
CARDIAC REHAB PHASE I   Pt walking independently in hallway, denies CP, dizziness, or SOB. Reinforced MI education, pt received education last week. Referred to [facility] [employer]. Pt anxious to go.   [id number] Rufina Falco, RN BSN 01/07/2021 10:04 AM

## 2021-01-11 ENCOUNTER — Other Ambulatory Visit
Admission: RE | Admit: 2021-01-11 | Discharge: 2021-01-11 | Disposition: A | Discharge status: Home / Self Care | Payer: Medicare Other | Source: Ambulatory Visit | Attending: Internal Medicine | Admitting: Internal Medicine

## 2021-01-11 DIAGNOSIS — Z9889 Other specified postprocedural states: Secondary | ICD-10-CM | POA: Insufficient documentation

## 2021-01-11 LAB — TROPONIN I (HIGH SENSITIVITY): Troponin I (High Sensitivity): 77 ng/L — ABNORMAL HIGH (ref <18)

## 2021-01-18 ENCOUNTER — Encounter: Payer: Self-pay | Admitting: *Deleted

## 2021-01-18 ENCOUNTER — Encounter: Payer: Medicare Other | Attending: Internal Medicine | Admitting: *Deleted

## 2021-01-18 ENCOUNTER — Other Ambulatory Visit: Payer: Self-pay

## 2021-01-18 DIAGNOSIS — I214 Non-ST elevation (NSTEMI) myocardial infarction: Secondary | ICD-10-CM | POA: Insufficient documentation

## 2021-01-18 DIAGNOSIS — Z955 Presence of coronary angioplasty implant and graft: Secondary | ICD-10-CM | POA: Insufficient documentation

## 2021-01-18 NOTE — Progress Notes (Signed)
Initial telephone orienation completed. Diagnosis can be found in [facility] 4/19. EP orientation scheduled for Wednesday 5/4 at 11am.

## 2021-01-20 ENCOUNTER — Other Ambulatory Visit: Payer: Self-pay

## 2021-01-20 VITALS — Ht 69.5 in | Wt 164.7 lb

## 2021-01-20 DIAGNOSIS — I214 Non-ST elevation (NSTEMI) myocardial infarction: Secondary | ICD-10-CM

## 2021-01-20 DIAGNOSIS — Z955 Presence of coronary angioplasty implant and graft: Secondary | ICD-10-CM

## 2021-01-20 NOTE — Patient Instructions (Addendum)
Patient Instructions  Patient Details  Name: Phillip [name] MRN: [id number] Date of Birth: 08/03/1946 Referring Provider:  Yolonda Kida, MD  Below are your personal goals for exercise, nutrition, and risk factors. Our goal is to help you stay on track towards obtaining and maintaining these goals. We will be discussing your progress on these goals with you throughout the program.  Initial Exercise Prescription:  Initial Exercise Prescription - 01/20/21 1300      Date of Initial Exercise RX and Referring Provider   Date 01/20/21    Referring Provider [name]      Treadmill   MPH 2.6    Grade 1    Minutes 15    METs 3.35      Recumbant Bike   Level 2    RPM 60    Watts 20    Minutes 15    METs 2.7      NuStep   Level 3    SPM 80    Minutes 15    METs 2.7      T5 Nustep   Level 2    SPM 80    Minutes 15    METs 2.7      Track   Laps 35    Minutes 15    METs 2.9      Prescription Details   Frequency (times per week) 2    Duration Progress to 30 minutes of continuous aerobic without signs/symptoms of physical distress      Intensity   THRR 40-80% of Max Heartrate 97-129    Ratings of Perceived Exertion 11-13    Perceived Dyspnea 0-4      Progression   Progression Continue to progress workloads to maintain intensity without signs/symptoms of physical distress.      Resistance Training   Training Prescription Yes    Weight 5 lb    Reps 10-15           Exercise Goals: Frequency: Be able to perform aerobic exercise two to three times per week in program working toward 2-5 days per week of home exercise.  Intensity: Work with a perceived exertion of 11 (fairly light) - 15 (hard) while following your exercise prescription.  We will make changes to your prescription with you as you progress through the program.   Duration: Be able to do 30 to 45 minutes of continuous aerobic exercise in addition to a 5 minute warm-up and a 5 minute cool-down  routine.   Nutrition Goals: Your personal nutrition goals will be established when you do your nutrition analysis with the dietician.  The following are general nutrition guidelines to follow: Cholesterol < 200mg /day Sodium < 1500mg /day Fiber: Men over 50 yrs - 30 grams per day  Personal Goals:  Personal Goals and Risk Factors at Admission - 01/20/21 1304      Core Components/Risk Factors/Patient Goals on Admission    Weight Management Yes;Weight Maintenance    Intervention Weight Management: Develop a combined nutrition and exercise program designed to reach desired caloric intake, while maintaining appropriate intake of nutrient and fiber, sodium and fats, and appropriate energy expenditure required for the weight goal.;Weight Management: Provide education and appropriate resources to help participant work on and attain dietary goals.;Weight Management/Obesity: Establish reasonable short term and long term weight goals.    Admit Weight 164 lb (74.4 kg)    Goal Weight: Short Term 164 lb (74.4 kg)    Goal Weight: Long Term 164 lb (74.4 kg)  Expected Outcomes Short Term: Continue to assess and modify interventions until short term weight is achieved;Long Term: Adherence to nutrition and physical activity/exercise program aimed toward attainment of established weight goal;Weight Maintenance: Understanding of the daily nutrition guidelines, which includes 25-35% calories from fat, 7% or less cal from saturated fats, less than 200mg  cholesterol, less than 1.5gm of sodium, & 5 or more servings of fruits and vegetables daily;Understanding recommendations for meals to include 15-35% energy as protein, 25-35% energy from fat, 35-60% energy from carbohydrates, less than 200mg  of dietary cholesterol, 20-35 gm of total fiber daily;Understanding of distribution of calorie intake throughout the day with the consumption of 4-5 meals/snacks    Hypertension Yes    Intervention Provide education on lifestyle  modifcations including regular physical activity/exercise, weight management, moderate sodium restriction and increased consumption of fresh fruit, vegetables, and low fat dairy, alcohol moderation, and smoking cessation.;Monitor prescription use compliance.    Expected Outcomes Short Term: Continued assessment and intervention until BP is < 140/34mm HG in hypertensive participants. < 130/77mm HG in hypertensive participants with diabetes, heart failure or chronic kidney disease.;Long Term: Maintenance of blood pressure at goal levels.    Lipids Yes    Intervention Provide education and support for participant on nutrition & aerobic/resistive exercise along with prescribed medications to achieve LDL 70mg , HDL >40mg .    Expected Outcomes Short Term: Participant states understanding of desired cholesterol values and is compliant with medications prescribed. Participant is following exercise prescription and nutrition guidelines.;Long Term: Cholesterol controlled with medications as prescribed, with individualized exercise RX and with personalized nutrition plan. Value goals: LDL < 70mg , HDL > 40 mg.           Tobacco Use Initial Evaluation: Social History   Tobacco Use  Smoking Status Never Smoker  Smokeless Tobacco Never Used    Exercise Goals and Review:  Exercise Goals    Row Name 01/20/21 1304             Exercise Goals   Increase Physical Activity Yes       Intervention Provide advice, education, support and counseling about physical activity/exercise needs.;Develop an individualized exercise prescription for aerobic and resistive training based on initial evaluation findings, risk stratification, comorbidities and participant's personal goals.       Expected Outcomes Short Term: Attend rehab on a regular basis to increase amount of physical activity.;Long Term: Add in home exercise to make exercise part of routine and to increase amount of physical activity.;Long Term: Exercising  regularly at least 3-5 days a week.       Increase Strength and Stamina Yes       Intervention Provide advice, education, support and counseling about physical activity/exercise needs.;Develop an individualized exercise prescription for aerobic and resistive training based on initial evaluation findings, risk stratification, comorbidities and participant's personal goals.       Expected Outcomes Short Term: Increase workloads from initial exercise prescription for resistance, speed, and METs.;Short Term: Perform resistance training exercises routinely during rehab and add in resistance training at home;Long Term: Improve cardiorespiratory fitness, muscular endurance and strength as measured by increased METs and functional capacity (6MWT)       Able to understand and use rate of perceived exertion (RPE) scale Yes       Intervention Provide education and explanation on how to use RPE scale       Expected Outcomes Short Term: Able to use RPE daily in rehab to express subjective intensity level;Long Term:  Able to use  RPE to guide intensity level when exercising independently       Able to understand and use Dyspnea scale Yes       Intervention Provide education and explanation on how to use Dyspnea scale       Expected Outcomes Short Term: Able to use Dyspnea scale daily in rehab to express subjective sense of shortness of breath during exertion;Long Term: Able to use Dyspnea scale to guide intensity level when exercising independently       Knowledge and understanding of Target Heart Rate Range (THRR) Yes       Intervention Provide education and explanation of THRR including how the numbers were predicted and where they are located for reference       Expected Outcomes Short Term: Able to state/look up THRR;Short Term: Able to use daily as guideline for intensity in rehab;Long Term: Able to use THRR to govern intensity when exercising independently       Able to check pulse independently Yes        Intervention Provide education and demonstration on how to check pulse in carotid and radial arteries.;Review the importance of being able to check your own pulse for safety during independent exercise       Expected Outcomes Short Term: Able to explain why pulse checking is important during independent exercise;Long Term: Able to check pulse independently and accurately       Understanding of Exercise Prescription Yes       Intervention Provide education, explanation, and written materials on patient's individual exercise prescription       Expected Outcomes Short Term: Able to explain program exercise prescription;Long Term: Able to explain home exercise prescription to exercise independently              Copy of goals given to participant.

## 2021-01-20 NOTE — Progress Notes (Signed)
Cardiac Individual Treatment Plan  Patient Details  Name: Phillip [name] MRN: [id number] Date of Birth: 25-Feb-1946 Referring Provider:   Flowsheet Row Cardiac Rehab from 01/20/2021 in [facility] Cardiac and Pulmonary Rehab  Referring Provider [name]      Initial Encounter Date:  Flowsheet Row Cardiac Rehab from 01/20/2021 in [facility] Cardiac and Pulmonary Rehab  Date 01/20/21      Visit Diagnosis: NSTEMI (non-ST elevated myocardial infarction) [facility])  Status post coronary artery stent placement  Patient's Home Medications on Admission:  Current Outpatient Medications:  .  acetaminophen (TYLENOL) 500 MG tablet, Take 500 mg by mouth every 6 (six) hours as needed., Disp: , Rfl:  .  amLODipine (NORVASC) 5 MG tablet, Take 5 mg by mouth daily., Disp: , Rfl:  .  aspirin 81 MG EC tablet, Take 1 tablet (81 mg total) by mouth daily. Swallow whole., Disp: 30 tablet, Rfl: 0 .  atorvastatin (LIPITOR) 80 MG tablet, Take 1 tablet (80 mg total) by mouth daily., Disp: 90 tablet, Rfl: 3 .  cholecalciferol (VITAMIN D3) 25 MCG (1000 UT) tablet, Take 1,000 Units by mouth daily. (Patient not taking: Reported on 01/18/2021), Disp: , Rfl:  .  Chromium 1 MG CAPS, Take 1 mg by mouth daily. 1 per day, Disp: , Rfl:  .  clopidogrel (PLAVIX) 75 MG tablet, Take 1 tablet (75 mg total) by mouth daily with breakfast., Disp: 90 tablet, Rfl: 3 .  enoxaparin (LOVENOX) 80 MG/0.8ML injection, Inject 0.8 mLs (80 mg total) into the skin every 12 (twelve) hours. (Patient not taking: Reported on 01/18/2021), Disp: 3.2 mL, Rfl: 0 .  Glucosamine-Chondroit-Vit C-Mn (GLUCOSAMINE CHONDR 1500 COMPLX PO), Take 1 tablet by mouth every evening., Disp: , Rfl:  .  isosorbide mononitrate (IMDUR) 30 MG 24 hr tablet, Take 1 tablet (30 mg total) by mouth daily., Disp: 90 tablet, Rfl: 3 .  levothyroxine (SYNTHROID) 50 MCG tablet, Take 50 mcg by mouth. (Patient not taking: Reported on 01/18/2021), Disp: , Rfl:  .  loratadine (CLARITIN) 10 MG tablet,  Take 10 mg by mouth in the morning and at bedtime., Disp: , Rfl:  .  metoprolol tartrate (LOPRESSOR) 25 MG tablet, Take 0.5 tablets (12.5 mg total) by mouth 2 (two) times daily., Disp: 60 tablet, Rfl: 0 .  Multiple Vitamin (MULTIVITAMIN) tablet, Take 1 tablet by mouth daily., Disp: , Rfl:  .  niacin 500 MG tablet, Take 500 mg by mouth at bedtime. (Patient not taking: Reported on 01/18/2021), Disp: , Rfl:  .  nitroGLYCERIN (NITROSTAT) 0.4 MG SL tablet, Place 1 tablet (0.4 mg total) under the tongue every 5 (five) minutes x 3 doses as needed for chest pain., Disp: 25 tablet, Rfl: 3 .  pantoprazole (PROTONIX) 40 MG tablet, Take 1 tablet (40 mg total) by mouth daily., Disp: 90 tablet, Rfl: 3 .  potassium phosphate, monobasic, (K-PHOS ORIGINAL) 500 MG tablet, Take 500 mg by mouth daily., Disp: , Rfl:  .  TURMERIC PO, Take 1 tablet by mouth daily., Disp: , Rfl:  .  warfarin (COUMADIN) 4 MG tablet, Take 0.5-1 tablets (2-4 mg total) by mouth See admin instructions. Take 4mg  daily by mouth with the exception of 2mg  daily by mouth on Mondays and Thursdays. Take 4 mg 01/07/21, then resume regular schedule., Disp: , Rfl:   Past Medical History: Past Medical History:  Diagnosis Date  . Arthritis   . Basal cell carcinoma    L clavicle, L prox forearm- removed years ago   . Bladder cancer (  HCC)   . Bladder neck contracture   . CAD (coronary artery disease)    a. 12/2020 NSTEMI/Cath: LM nl, LAD 95ost/p, LCX 50p, RCA nl. EF 45-50%.  . Cataract   . CKD (chronic kidney disease), stage III ([place])   . ED (erectile dysfunction)   . Frequency   . GERD (gastroesophageal reflux disease)   . 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 Following LE DVT-->Chronic warfarin.  Marland Kitchen Hyperlipidemia LDL goal <70   . Hypertension   . Hypothyroidism   . Incontinence of urine    sui, s/p cryoablation  . Ischemic cardiomyopathy    a. 11/2018 Echo: EF 60-65%; b. 12/2020 LV Gram: EF 45-50% in setting of NSTEMI.  Marland Kitchen Kidney stones    . Neuropathy    feet  . Nocturia   . Prostate cancer ([place])    S/P   CRYOABLATION  . Right elbow tendinitis   . Right Lower Extremity DVT (deep venous thrombosis) ([place]) 11/25/2018  . Vertigo    1-2x/yr  . Wears glasses     Tobacco Use: Social History   Tobacco Use  Smoking Status Never Smoker  Smokeless Tobacco Never Used    Labs: Recent Review Flowsheet Data    Labs for ITP Cardiac and Pulmonary Rehab Latest Ref Rng & Units 12/06/2013 12/14/2015 01/15/2020 12/24/2020   Cholestrol 0 - 200 mg/dL - - - 174   LDLCALC 0 - 99 mg/dL - - - 119(H)   HDL >40 mg/dL - - - 32(L)   Trlycerides <150 mg/dL - - - 113   TCO2 22 - 32 mmol/L 24 23 27  -       Exercise Target Goals: Exercise Program Goal: Individual exercise prescription set using results from initial 6 min walk test and THRR while considering  patient's activity barriers and safety.   Exercise Prescription Goal: Initial exercise prescription builds to 30-45 minutes a day of aerobic activity, 2-3 days per week.  Home exercise guidelines will be given to patient during program as part of exercise prescription that the participant will acknowledge.   Education: Aerobic Exercise: - Group verbal and visual presentation on the components of exercise prescription. Introduces F.I.T.T principle from ACSM for exercise prescriptions.  Reviews F.I.T.T. principles of aerobic exercise including progression. Written material given at graduation.   Education: Resistance Exercise: - Group verbal and visual presentation on the components of exercise prescription. Introduces F.I.T.T principle from ACSM for exercise prescriptions  Reviews F.I.T.T. principles of resistance exercise including progression. Written material given at graduation.    Education: Exercise & Equipment Safety: - Individual verbal instruction and demonstration of equipment use and safety with use of the equipment. Flowsheet Row Cardiac Rehab from 01/20/2021 in [facility] Cardiac  and Pulmonary Rehab  Education need identified 01/20/21  Date 01/20/21  Educator [place]  Instruction Review Code 1- Verbalizes Understanding      Education: Exercise Physiology & General Exercise Guidelines: - Group verbal and written instruction with models to review the exercise physiology of the cardiovascular system and associated critical values. Provides general exercise guidelines with specific guidelines to those with heart or lung disease.    Education: Flexibility, Balance, Mind/Body Relaxation: - Group verbal and visual presentation with interactive activity on the components of exercise prescription. Introduces F.I.T.T principle from ACSM for exercise prescriptions. Reviews F.I.T.T. principles of flexibility and balance exercise training including progression. Also discusses the mind body connection.  Reviews various relaxation techniques to help reduce and manage stress (i.e. Deep breathing, progressive  muscle relaxation, and visualization). Balance handout provided to take home. Written material given at graduation.   Activity Barriers & Risk Stratification:  Activity Barriers & Cardiac Risk Stratification - 01/20/21 1241      Activity Barriers & Cardiac Risk Stratification   Activity Barriers Other (comment)    Comments Neuropathy- feet    Cardiac Risk Stratification High           6 Minute Walk:  6 Minute Walk    Row Name 01/20/21 1242         6 Minute Walk   Phase Initial     Distance 1360 feet     Walk Time 6 minutes     # of Rest Breaks 0     MPH 2.57     METS 2.79     RPE 9     Perceived Dyspnea  0     VO2 Peak 9.78     Symptoms No     Resting HR 65 bpm     Resting BP 118/68     Resting Oxygen Saturation  96 %     Exercise Oxygen Saturation  during 6 min walk 96 %     Max Ex. HR 75 bpm     Max Ex. BP 128/66     2 Minute Post BP 114/64            Oxygen Initial Assessment:   Oxygen Re-Evaluation:   Oxygen Discharge (Final Oxygen  Re-Evaluation):   Initial Exercise Prescription:  Initial Exercise Prescription - 01/20/21 1300      Date of Initial Exercise RX and Referring Provider   Date 01/20/21    Referring Provider [name]      Treadmill   MPH 2.6    Grade 1    Minutes 15    METs 3.35      Recumbant Bike   Level 2    RPM 60    Watts 20    Minutes 15    METs 2.7      NuStep   Level 3    SPM 80    Minutes 15    METs 2.7      T5 Nustep   Level 2    SPM 80    Minutes 15    METs 2.7      Track   Laps 35    Minutes 15    METs 2.9      Prescription Details   Frequency (times per week) 2    Duration Progress to 30 minutes of continuous aerobic without signs/symptoms of physical distress      Intensity   THRR 40-80% of Max Heartrate 97-129    Ratings of Perceived Exertion 11-13    Perceived Dyspnea 0-4      Progression   Progression Continue to progress workloads to maintain intensity without signs/symptoms of physical distress.      Resistance Training   Training Prescription Yes    Weight 5 lb    Reps 10-15           Perform Capillary Blood Glucose checks as needed.  Exercise Prescription Changes:   Exercise Prescription Changes    Row Name 01/20/21 1300             Response to Exercise   Blood Pressure (Admit) 118/68       Blood Pressure (Exercise) 128/66       Blood Pressure (Exit) 114/64  Heart Rate (Admit) 65 bpm       Heart Rate (Exercise) 76 bpm       Heart Rate (Exit) 60 bpm       Oxygen Saturation (Admit) 96 %       Oxygen Saturation (Exercise) 96 %       Oxygen Saturation (Exit) 96 %       Rating of Perceived Exertion (Exercise) 9       Perceived Dyspnea (Exercise) 0       Symptoms none       Comments walk test results              Exercise Comments:   Exercise Goals and Review:   Exercise Goals    Row Name 01/20/21 1304             Exercise Goals   Increase Physical Activity Yes       Intervention Provide advice,  education, support and counseling about physical activity/exercise needs.;Develop an individualized exercise prescription for aerobic and resistive training based on initial evaluation findings, risk stratification, comorbidities and participant's personal goals.       Expected Outcomes Short Term: Attend rehab on a regular basis to increase amount of physical activity.;Long Term: Add in home exercise to make exercise part of routine and to increase amount of physical activity.;Long Term: Exercising regularly at least 3-5 days a week.       Increase Strength and Stamina Yes       Intervention Provide advice, education, support and counseling about physical activity/exercise needs.;Develop an individualized exercise prescription for aerobic and resistive training based on initial evaluation findings, risk stratification, comorbidities and participant's personal goals.       Expected Outcomes Short Term: Increase workloads from initial exercise prescription for resistance, speed, and METs.;Short Term: Perform resistance training exercises routinely during rehab and add in resistance training at home;Long Term: Improve cardiorespiratory fitness, muscular endurance and strength as measured by increased METs and functional capacity (6MWT)       Able to understand and use rate of perceived exertion (RPE) scale Yes       Intervention Provide education and explanation on how to use RPE scale       Expected Outcomes Short Term: Able to use RPE daily in rehab to express subjective intensity level;Long Term:  Able to use RPE to guide intensity level when exercising independently       Able to understand and use Dyspnea scale Yes       Intervention Provide education and explanation on how to use Dyspnea scale       Expected Outcomes Short Term: Able to use Dyspnea scale daily in rehab to express subjective sense of shortness of breath during exertion;Long Term: Able to use Dyspnea scale to guide intensity level when  exercising independently       Knowledge and understanding of Target Heart Rate Range (THRR) Yes       Intervention Provide education and explanation of THRR including how the numbers were predicted and where they are located for reference       Expected Outcomes Short Term: Able to state/look up THRR;Short Term: Able to use daily as guideline for intensity in rehab;Long Term: Able to use THRR to govern intensity when exercising independently       Able to check pulse independently Yes       Intervention Provide education and demonstration on how to check pulse in carotid and radial arteries.;Review the importance of  being able to check your own pulse for safety during independent exercise       Expected Outcomes Short Term: Able to explain why pulse checking is important during independent exercise;Long Term: Able to check pulse independently and accurately       Understanding of Exercise Prescription Yes       Intervention Provide education, explanation, and written materials on patient's individual exercise prescription       Expected Outcomes Short Term: Able to explain program exercise prescription;Long Term: Able to explain home exercise prescription to exercise independently              Exercise Goals Re-Evaluation :   Discharge Exercise Prescription (Final Exercise Prescription Changes):  Exercise Prescription Changes - 01/20/21 1300      Response to Exercise   Blood Pressure (Admit) 118/68    Blood Pressure (Exercise) 128/66    Blood Pressure (Exit) 114/64    Heart Rate (Admit) 65 bpm    Heart Rate (Exercise) 76 bpm    Heart Rate (Exit) 60 bpm    Oxygen Saturation (Admit) 96 %    Oxygen Saturation (Exercise) 96 %    Oxygen Saturation (Exit) 96 %    Rating of Perceived Exertion (Exercise) 9    Perceived Dyspnea (Exercise) 0    Symptoms none    Comments walk test results           Nutrition:  Target Goals: Understanding of nutrition guidelines, daily intake of sodium  1500mg , cholesterol 200mg , calories 30% from fat and 7% or less from saturated fats, daily to have 5 or more servings of fruits and vegetables.  Education: All About Nutrition: -Group instruction provided by verbal, written material, interactive activities, discussions, models, and posters to present general guidelines for heart healthy nutrition including fat, fiber, MyPlate, the role of sodium in heart healthy nutrition, utilization of the nutrition label, and utilization of this knowledge for meal planning. Follow up email sent as well. Written material given at graduation.   Biometrics:  Pre Biometrics - 01/20/21 1241      Pre Biometrics   Height 5' 9.5" (1.765 m)    Weight 164 lb 11.2 oz (74.7 kg)    BMI (Calculated) 23.98    Single Leg Stand 30 seconds            Nutrition Therapy Plan and Nutrition Goals:   Nutrition Assessments:  MEDIFICTS Score Key:  ?70 Need to make dietary changes   40-70 Heart Healthy Diet  ? 40 Therapeutic Level Cholesterol Diet  Flowsheet Row Cardiac Rehab from 01/20/2021 in [facility] Cardiac and Pulmonary Rehab  Picture Your Plate Total Score on Admission 61     Picture Your Plate Scores:  <32 Unhealthy dietary pattern with much room for improvement.  41-50 Dietary pattern unlikely to meet recommendations for good health and room for improvement.  51-60 More healthful dietary pattern, with some room for improvement.   >60 Healthy dietary pattern, although there may be some specific behaviors that could be improved.    Nutrition Goals Re-Evaluation:   Nutrition Goals Discharge (Final Nutrition Goals Re-Evaluation):   Psychosocial: Target Goals: Acknowledge presence or absence of significant depression and/or stress, maximize coping skills, provide positive support system. Participant is able to verbalize types and ability to use techniques and skills needed for reducing stress and depression.   Education: Stress, Anxiety, and  Depression - Group verbal and visual presentation to define topics covered.  Reviews how body is impacted by stress, anxiety,  and depression.  Also discusses healthy ways to reduce stress and to treat/manage anxiety and depression.  Written material given at graduation.   Education: Sleep Hygiene -Provides group verbal and written instruction about how sleep can affect your health.  Define sleep hygiene, discuss sleep cycles and impact of sleep habits. Review good sleep hygiene tips.    Initial Review & Psychosocial Screening:  Initial Psych Review & Screening - 01/18/21 1111      Initial Review   Current issues with None Identified      Family Dynamics   Good Support System? Yes      Barriers   Psychosocial barriers to participate in program There are no identifiable barriers or psychosocial needs.      Screening Interventions   Interventions Encouraged to exercise;To provide support and resources with identified psychosocial needs;Provide feedback about the scores to participant    Expected Outcomes Short Term goal: Utilizing psychosocial counselor, staff and physician to assist with identification of specific Stressors or current issues interfering with healing process. Setting desired goal for each stressor or current issue identified.;Long Term Goal: Stressors or current issues are controlled or eliminated.;Short Term goal: Identification and review with participant of any Quality of Life or Depression concerns found by scoring the questionnaire.;Long Term goal: The participant improves quality of Life and PHQ9 Scores as seen by post scores and/or verbalization of changes           Quality of Life Scores:   Quality of Life - 01/20/21 1253      Quality of Life   Select Quality of Life      Quality of Life Scores   Health/Function Pre 25.8 %    Socioeconomic Pre 29.64 %    Psych/Spiritual Pre 28.07 %    Family Pre 24.6 %    GLOBAL Pre 26.88 %          Scores of 19 and  below usually indicate a poorer quality of life in these areas.  A difference of  2-3 points is a clinically meaningful difference.  A difference of 2-3 points in the total score of the Quality of Life Index has been associated with significant improvement in overall quality of life, self-image, physical symptoms, and general health in studies assessing change in quality of life.  PHQ-9: Recent Review Flowsheet Data    Depression screen [facility] 2/9 01/20/2021   Decreased Interest 0   Down, Depressed, Hopeless 0   PHQ - 2 Score 0   Altered sleeping 2   Tired, decreased energy 0   Change in appetite 0   Feeling bad or failure about yourself  0   Trouble concentrating 1   Moving slowly or fidgety/restless 0   Suicidal thoughts 0   PHQ-9 Score 3   Difficult doing work/chores Not difficult at all     Interpretation of Total Score  Total Score Depression Severity:  1-4 = Minimal depression, 5-9 = Mild depression, 10-14 = Moderate depression, 15-19 = Moderately severe depression, 20-27 = Severe depression   Psychosocial Evaluation and Intervention:  Psychosocial Evaluation - 01/18/21 1119      Psychosocial Evaluation & Interventions   Interventions Encouraged to exercise with the program and follow exercise prescription    Comments 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 They are unsure why it went away, he said one doctor thinnks maybe the vasodilators and  blood thinners. He was seeing a clinic for months trying to figure it all out, but he is taking it day by day and is hopeful it stays away. He sleeps well, enjoys golf, and doesn't report any major stressors. He wants to keep being active and is hopeful this program accompanies his lifestyle well.    Expected Outcomes Short: attend cardiac rehab for education and exercise. Long: develop positive self care habits.    Continue Psychosocial  Services  Follow up required by staff           Psychosocial Re-Evaluation:   Psychosocial Discharge (Final Psychosocial Re-Evaluation):   Vocational Rehabilitation: Provide vocational rehab assistance to qualifying candidates.   Vocational Rehab Evaluation & Intervention:  Vocational Rehab - 01/18/21 1110      Initial Vocational Rehab Evaluation & Intervention   Assessment shows need for Vocational Rehabilitation No           Education: Education Goals: Education classes will be provided on a variety of topics geared toward better understanding of heart health and risk factor modification. Participant will state understanding/return demonstration of topics presented as noted by education test scores.  Learning Barriers/Preferences:  Learning Barriers/Preferences - 01/18/21 1109      Learning Barriers/Preferences   Learning Barriers None    Learning Preferences None           General Cardiac Education Topics:  AED/CPR: - Group verbal and written instruction with the use of models to demonstrate the basic use of the AED with the basic ABC's of resuscitation.   Anatomy and Cardiac Procedures: - Group verbal and visual presentation and models provide information about basic cardiac anatomy and function. Reviews the testing methods done to diagnose heart disease and the outcomes of the test results. Describes the treatment choices: Medical Management, Angioplasty, or Coronary Bypass Surgery for treating various heart conditions including Myocardial Infarction, Angina, Valve Disease, and Cardiac Arrhythmias.  Written material given at graduation.   Medication Safety: - Group verbal and visual instruction to review commonly prescribed medications for heart and lung disease. Reviews the medication, class of the drug, and side effects. Includes the steps to properly store meds and maintain the prescription regimen.  Written material given at graduation.   Intimacy: -  Group verbal instruction through game format to discuss how heart and lung disease can affect sexual intimacy. Written material given at graduation..   Know Your Numbers and Heart Failure: - Group verbal and visual instruction to discuss disease risk factors for cardiac and pulmonary disease and treatment options.  Reviews associated critical values for Overweight/Obesity, Hypertension, Cholesterol, and Diabetes.  Discusses basics of heart failure: signs/symptoms and treatments.  Introduces Heart Failure Zone chart for action plan for heart failure.  Written material given at graduation.   Infection Prevention: - Provides verbal and written material to individual with discussion of infection control including proper hand washing and proper equipment cleaning during exercise session. Flowsheet Row Cardiac Rehab from 01/20/2021 in [facility] Cardiac and Pulmonary Rehab  Education need identified 01/20/21  Date 01/20/21  Educator [place] [facility] [place]  Instruction Review Code 1- Verbalizes Understanding      Falls Prevention: - Provides verbal and written material to individual with discussion of falls prevention and safety. Flowsheet Row Cardiac Rehab from 01/20/2021 in [facility] Cardiac and Pulmonary Rehab  Education need identified 01/20/21  Date 01/20/21  Educator [place]  Instruction Review Code 1- Verbalizes Understanding      Other: -Provides group and verbal instruction on various topics (see  comments)   Knowledge Questionnaire Score:  Knowledge Questionnaire Score - 01/20/21 1257      Knowledge Questionnaire Score   Pre Score 24/26: Angina, Exercise           Core Components/Risk Factors/Patient Goals at Admission:  Personal Goals and Risk Factors at Admission - 01/20/21 1304      Core Components/Risk Factors/Patient Goals on Admission    Weight Management Yes;Weight Maintenance    Intervention Weight Management: Develop a combined nutrition and exercise program designed to reach desired caloric intake,  while maintaining appropriate intake of nutrient and fiber, sodium and fats, and appropriate energy expenditure required for the weight goal.;Weight Management: Provide education and appropriate resources to help participant work on and attain dietary goals.;Weight Management/Obesity: Establish reasonable short term and long term weight goals.    Admit Weight 164 lb (74.4 kg)    Goal Weight: Short Term 164 lb (74.4 kg)    Goal Weight: Long Term 164 lb (74.4 kg)    Expected Outcomes Short Term: Continue to assess and modify interventions until short term weight is achieved;Long Term: Adherence to nutrition and physical activity/exercise program aimed toward attainment of established weight goal;Weight Maintenance: Understanding of the daily nutrition guidelines, which includes 25-35% calories from fat, 7% or less cal from saturated fats, less than 200mg  cholesterol, less than 1.5gm of sodium, & 5 or more servings of fruits and vegetables daily;Understanding recommendations for meals to include 15-35% energy as protein, 25-35% energy from fat, 35-60% energy from carbohydrates, less than 200mg  of dietary cholesterol, 20-35 gm of total fiber daily;Understanding of distribution of calorie intake throughout the day with the consumption of 4-5 meals/snacks    Hypertension Yes    Intervention Provide education on lifestyle modifcations including regular physical activity/exercise, weight management, moderate sodium restriction and increased consumption of fresh fruit, vegetables, and low fat dairy, alcohol moderation, and smoking cessation.;Monitor prescription use compliance.    Expected Outcomes Short Term: Continued assessment and intervention until BP is < 140/100mm HG in hypertensive participants. < 130/51mm HG in hypertensive participants with diabetes, heart failure or chronic kidney disease.;Long Term: Maintenance of blood pressure at goal levels.    Lipids Yes    Intervention Provide education and support  for participant on nutrition & aerobic/resistive exercise along with prescribed medications to achieve LDL 70mg , HDL >40mg .    Expected Outcomes Short Term: Participant states understanding of desired cholesterol values and is compliant with medications prescribed. Participant is following exercise prescription and nutrition guidelines.;Long Term: Cholesterol controlled with medications as prescribed, with individualized exercise RX and with personalized nutrition plan. Value goals: LDL < 70mg , HDL > 40 mg.           Education:Diabetes - Individual verbal and written instruction to review signs/symptoms of diabetes, desired ranges of glucose level fasting, after meals and with exercise. Acknowledge that pre and post exercise glucose checks will be done for 3 sessions at entry of program.   Core Components/Risk Factors/Patient Goals Review:    Core Components/Risk Factors/Patient Goals at Discharge (Final Review):    ITP Comments:  ITP Comments    Row Name 01/18/21 1122 01/20/21 1237         ITP Comments Initial telephone orienation completed. Diagnosis can be found in [facility] 4/19. EP orientation scheduled for Wednesday 5/4 at 11am. Completed 6MWT and gym orientation. Initial ITP created and sent for review to Dr. Emily Filbert, Medical Director.             Comments: Initial  ITP

## 2021-01-22 ENCOUNTER — Encounter: Payer: Medicare Other | Admitting: *Deleted

## 2021-01-22 ENCOUNTER — Other Ambulatory Visit: Payer: Self-pay

## 2021-01-22 DIAGNOSIS — Z955 Presence of coronary angioplasty implant and graft: Secondary | ICD-10-CM

## 2021-01-22 DIAGNOSIS — I214 Non-ST elevation (NSTEMI) myocardial infarction: Secondary | ICD-10-CM

## 2021-01-22 NOTE — Progress Notes (Signed)
Daily Session Note  Patient Details  Name: Phillip [name] MRN: [id number] Date of Birth: [birth year] Referring Provider:   Flowsheet Row Cardiac Rehab from 01/20/2021 in [facility] Cardiac [facility] Pulmonary Rehab  Referring Provider [name]      Encounter Date: 01/22/2021  Check In:  Session Check In - 01/22/21 0842      Check-In   Supervising physician immediately available to respond to emergencies See telemetry face sheet for immediately available ER MD    Location [facility]-Cardiac & Pulmonary Rehab    Staff Present Heath Lark, RN, BSN, CCRP;Jessica [place], MA, RCEP, CCRP, CCET;Joseph [place] RCP,RRT,BSRT    Virtual Visit No    Medication changes reported     No    Fall or balance concerns reported    No    Warm-up and Cool-down Performed on first and last piece of equipment    Resistance Training Performed Yes    VAD Patient? No    PAD/SET Patient? No      Pain Assessment   Currently in Pain? No/denies              Social History   Tobacco Use  Smoking Status Never Smoker  Smokeless Tobacco Never Used    Goals Met:  Exercise tolerated well Personal goals reviewed No report of cardiac concerns or symptoms  Goals Unmet:  Not Applicable  Comments: First full day of exercise!  Patient was oriented to gym and equipment including functions, settings, policies, and procedures.  Patient's individual exercise prescription and treatment plan were reviewed.  All starting workloads were established based on the results of the 6 minute walk test done at initial orientation visit.  The plan for exercise progression was also introduced and progression will be customized based on patient's performance and goals.    Dr. Emily Filbert is Medical Director for [place] and [facility].

## 2021-01-25 ENCOUNTER — Other Ambulatory Visit: Payer: Self-pay

## 2021-01-25 ENCOUNTER — Encounter: Payer: Medicare Other | Admitting: *Deleted

## 2021-01-25 DIAGNOSIS — Z955 Presence of coronary angioplasty implant and graft: Secondary | ICD-10-CM

## 2021-01-25 DIAGNOSIS — I214 Non-ST elevation (NSTEMI) myocardial infarction: Secondary | ICD-10-CM

## 2021-01-25 NOTE — Progress Notes (Signed)
Daily Session Note  Patient Details  Name: Phillip [name] MRN: [id number] Date of Birth: 12-Jun-1946 Referring Provider:   Flowsheet Row Cardiac Rehab from 01/20/2021 in [facility] Cardiac and Pulmonary Rehab  Referring Provider [name]      Encounter Date: 01/25/2021  Check In:  Session Check In - 01/25/21 0948      Check-In   Supervising physician immediately available to respond to emergencies See telemetry face sheet for immediately available ER MD    Location [facility]-Cardiac & Pulmonary Rehab    Staff Present Heath Lark, RN, BSN, Laveda Norman, BS, ACSM CEP, Exercise Physiologist;Joseph Tessie Fass RCP,RRT,BSRT    Virtual Visit No    Medication changes reported     No    Fall or balance concerns reported    No    Warm-up and Cool-down Performed on first and last piece of equipment    Resistance Training Performed Yes    VAD Patient? No    PAD/SET Patient? No      Pain Assessment   Currently in Pain? No/denies              Social History   Tobacco Use  Smoking Status Never Smoker  Smokeless Tobacco Never Used    Goals Met:  Independence with exercise equipment Exercise tolerated well No report of cardiac concerns or symptoms  Goals Unmet:  Not Applicable  Comments: Pt able to follow exercise prescription today without complaint.  Will continue to monitor for progression.    Dr. Emily Filbert is Medical Director for [place] and [facility].

## 2021-01-29 ENCOUNTER — Other Ambulatory Visit: Payer: Self-pay

## 2021-01-29 ENCOUNTER — Encounter: Payer: Medicare Other | Admitting: *Deleted

## 2021-01-29 DIAGNOSIS — I214 Non-ST elevation (NSTEMI) myocardial infarction: Secondary | ICD-10-CM

## 2021-01-29 DIAGNOSIS — Z955 Presence of coronary angioplasty implant and graft: Secondary | ICD-10-CM

## 2021-01-29 NOTE — Progress Notes (Signed)
Daily Session Note  Patient Details  Name: Phillip [name] MRN: [id number] Date of Birth: Aug 17, 1946 Referring Provider:   Flowsheet Row Cardiac Rehab from 01/20/2021 in [facility] Cardiac and Pulmonary Rehab  Referring Provider [name]      Encounter Date: 01/29/2021  Check In:  Session Check In - 01/29/21 0843      Check-In   Supervising physician immediately available to respond to emergencies See telemetry face sheet for immediately available ER MD    Location [facility]-Cardiac & Pulmonary Rehab    Staff Present Heath Lark, RN, BSN, CCRP;Jessica [place], MA, RCEP, CCRP, CCET;Melissa Alma RDN, LDN    Virtual Visit No    Medication changes reported     No    Fall or balance concerns reported    No    Warm-up and Cool-down Performed on first and last piece of equipment    Resistance Training Performed Yes    VAD Patient? No    PAD/SET Patient? No      Pain Assessment   Currently in Pain? No/denies              Social History   Tobacco Use  Smoking Status Never Smoker  Smokeless Tobacco Never Used    Goals Met:  Independence with exercise equipment Exercise tolerated well No report of cardiac concerns or symptoms  Goals Unmet:  Not Applicable  Comments: Pt able to follow exercise prescription today without complaint.  Will continue to monitor for progression.    Dr. Emily Filbert is Medical Director for [place] and [facility].

## 2021-02-01 ENCOUNTER — Encounter: Payer: Medicare Other | Admitting: *Deleted

## 2021-02-01 ENCOUNTER — Other Ambulatory Visit: Payer: Self-pay

## 2021-02-01 DIAGNOSIS — I214 Non-ST elevation (NSTEMI) myocardial infarction: Secondary | ICD-10-CM

## 2021-02-01 DIAGNOSIS — Z955 Presence of coronary angioplasty implant and graft: Secondary | ICD-10-CM

## 2021-02-01 NOTE — Progress Notes (Signed)
Daily Session Note  Patient Details  Name: MARKON JARES MRN: [id number] Date of Birth: 07/13/1946 Referring Provider:   Flowsheet Row Cardiac Rehab from 01/20/2021 in [facility] Cardiac and Pulmonary Rehab  Referring Provider [name]      Encounter Date: 02/01/2021  Check In:  Session Check In - 02/01/21 0843      Check-In   Supervising physician immediately available to respond to emergencies See telemetry face sheet for immediately available ER MD    Location [facility]-Cardiac & Pulmonary Rehab    Staff Present Heath Lark, RN, BSN, CCRP;Joseph [place] RCP,RRT,BSRT;Kelly [place], [place], ACSM CEP, Exercise Physiologist    Virtual Visit No    Medication changes reported     No    Fall or balance concerns reported    No    Warm-up and Cool-down Performed on first and last piece of equipment    Resistance Training Performed Yes    VAD Patient? No    PAD/SET Patient? No      Pain Assessment   Currently in Pain? No/denies              Social History   Tobacco Use  Smoking Status Never Smoker  Smokeless Tobacco Never Used    Goals Met:  Independence with exercise equipment Exercise tolerated well No report of cardiac concerns or symptoms  Goals Unmet:  Not Applicable  Comments: Pt able to follow exercise prescription today without complaint.  Will continue to monitor for progression.    Dr. Emily Filbert is Medical Director for [place] and [facility].

## 2021-02-03 ENCOUNTER — Other Ambulatory Visit: Payer: Self-pay

## 2021-02-03 ENCOUNTER — Encounter: Payer: Self-pay | Admitting: *Deleted

## 2021-02-03 DIAGNOSIS — I214 Non-ST elevation (NSTEMI) myocardial infarction: Secondary | ICD-10-CM

## 2021-02-03 DIAGNOSIS — Z955 Presence of coronary angioplasty implant and graft: Secondary | ICD-10-CM

## 2021-02-03 NOTE — Progress Notes (Signed)
Daily Session Note  Patient Details  Name: Phillip [name] MRN: [id number] Date of Birth: 10/20/1945 Referring Provider:   Flowsheet Row Cardiac Rehab from 01/20/2021 in [facility] Cardiac and Pulmonary Rehab  Referring Provider [place], Dwayne      Encounter Date: 02/03/2021  Check In:  Session Check In - 02/03/21 0758      Check-In   Supervising physician immediately available to respond to emergencies See telemetry face sheet for immediately available ER MD    Location [facility]-Cardiac & Pulmonary Rehab    Staff Present  , MPA, RN;Amanda Sommer, BA, ACSM CEP, Exercise Physiologist;Joseph Hood RCP,RRT,BSRT    Virtual Visit No    Medication changes reported     No    Fall or balance concerns reported    No    Warm-up and Cool-down Performed on first and last piece of equipment    Resistance Training Performed Yes    VAD Patient? No    PAD/SET Patient? No      Pain Assessment   Currently in Pain? No/denies              Social History   Tobacco Use  Smoking Status Never Smoker  Smokeless Tobacco Never Used    Goals Met:  Independence with exercise equipment Exercise tolerated well No report of cardiac concerns or symptoms Strength training completed today  Goals Unmet:  Not Applicable  Comments: Pt able to follow exercise prescription today without complaint.  Will continue to monitor for progression.    Dr. Mark Miller is Medical Director for [facility] and [facility]. 

## 2021-02-03 NOTE — Progress Notes (Signed)
Cardiac Individual Treatment Plan  Patient Details  Name: Phillip [name] MRN: [id number] Date of Birth: 03/12/1946 Referring Provider:   Flowsheet Row Cardiac Rehab from 01/20/2021 in [facility] Cardiac and Pulmonary Rehab  Referring Provider [name]      Initial Encounter Date:  Flowsheet Row Cardiac Rehab from 01/20/2021 in [facility] Cardiac and Pulmonary Rehab  Date 01/20/21      Visit Diagnosis: NSTEMI (non-ST elevated myocardial infarction) [facility])  Status post coronary artery stent placement  Patient's Home Medications on Admission:  Current Outpatient Medications:  .  acetaminophen (TYLENOL) 500 MG tablet, Take 500 mg by mouth every 6 (six) hours as needed., Disp: , Rfl:  .  amLODipine (NORVASC) 5 MG tablet, Take 5 mg by mouth daily., Disp: , Rfl:  .  aspirin 81 MG EC tablet, Take 1 tablet (81 mg total) by mouth daily. Swallow whole., Disp: 30 tablet, Rfl: 0 .  atorvastatin (LIPITOR) 80 MG tablet, Take 1 tablet (80 mg total) by mouth daily., Disp: 90 tablet, Rfl: 3 .  cholecalciferol (VITAMIN D3) 25 MCG (1000 UT) tablet, Take 1,000 Units by mouth daily. (Patient not taking: Reported on 01/18/2021), Disp: , Rfl:  .  Chromium 1 MG CAPS, Take 1 mg by mouth daily. 1 per day, Disp: , Rfl:  .  clopidogrel (PLAVIX) 75 MG tablet, Take 1 tablet (75 mg total) by mouth daily with breakfast., Disp: 90 tablet, Rfl: 3 .  enoxaparin (LOVENOX) 80 MG/0.8ML injection, Inject 0.8 mLs (80 mg total) into the skin every 12 (twelve) hours. (Patient not taking: Reported on 01/18/2021), Disp: 3.2 mL, Rfl: 0 .  Glucosamine-Chondroit-Vit C-Mn (GLUCOSAMINE CHONDR 1500 COMPLX PO), Take 1 tablet by mouth every evening., Disp: , Rfl:  .  isosorbide mononitrate (IMDUR) 30 MG 24 hr tablet, Take 1 tablet (30 mg total) by mouth daily., Disp: 90 tablet, Rfl: 3 .  levothyroxine (SYNTHROID) 50 MCG tablet, Take 50 mcg by mouth. (Patient not taking: Reported on 01/18/2021), Disp: , Rfl:  .  loratadine (CLARITIN) 10 MG tablet,  Take 10 mg by mouth in the morning and at bedtime., Disp: , Rfl:  .  metoprolol tartrate (LOPRESSOR) 25 MG tablet, Take 0.5 tablets (12.5 mg total) by mouth 2 (two) times daily., Disp: 60 tablet, Rfl: 0 .  Multiple Vitamin (MULTIVITAMIN) tablet, Take 1 tablet by mouth daily., Disp: , Rfl:  .  niacin 500 MG tablet, Take 500 mg by mouth at bedtime. (Patient not taking: Reported on 01/18/2021), Disp: , Rfl:  .  nitroGLYCERIN (NITROSTAT) 0.4 MG SL tablet, Place 1 tablet (0.4 mg total) under the tongue every 5 (five) minutes x 3 doses as needed for chest pain., Disp: 25 tablet, Rfl: 3 .  pantoprazole (PROTONIX) 40 MG tablet, Take 1 tablet (40 mg total) by mouth daily., Disp: 90 tablet, Rfl: 3 .  potassium phosphate, monobasic, (K-PHOS ORIGINAL) 500 MG tablet, Take 500 mg by mouth daily., Disp: , Rfl:  .  TURMERIC PO, Take 1 tablet by mouth daily., Disp: , Rfl:  .  warfarin (COUMADIN) 4 MG tablet, Take 0.5-1 tablets (2-4 mg total) by mouth See admin instructions. Take 4mg  daily by mouth with the exception of 2mg  daily by mouth on Mondays and Thursdays. Take 4 mg 01/07/21, then resume regular schedule., Disp: , Rfl:   Past Medical History: Past Medical History:  Diagnosis Date  . Arthritis   . Basal cell carcinoma    L clavicle, L prox forearm- removed years ago   . Bladder cancer (  HCC)   . Bladder neck contracture   . CAD (coronary artery disease)    a. 12/2020 NSTEMI/Cath: LM nl, LAD 95ost/p, LCX 50p, RCA nl. EF 45-50%.  . Cataract   . CKD (chronic kidney disease), stage III ([place])   . ED (erectile dysfunction)   . Frequency   . GERD (gastroesophageal reflux disease)   . 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 Following LE DVT-->Chronic warfarin.  Marland Kitchen Hyperlipidemia LDL goal <70   . Hypertension   . Hypothyroidism   . Incontinence of urine    sui, s/p cryoablation  . Ischemic cardiomyopathy    a. 11/2018 Echo: EF 60-65%; b. 12/2020 LV Gram: EF 45-50% in setting of NSTEMI.  Marland Kitchen Kidney stones    . Neuropathy    feet  . Nocturia   . Prostate cancer ([place])    S/P   CRYOABLATION  . Right elbow tendinitis   . Right Lower Extremity DVT (deep venous thrombosis) ([place]) 11/25/2018  . Vertigo    1-2x/yr  . Wears glasses     Tobacco Use: Social History   Tobacco Use  Smoking Status Never Smoker  Smokeless Tobacco Never Used    Labs: Recent Review Flowsheet Data    Labs for ITP Cardiac and Pulmonary Rehab Latest Ref Rng & Units 12/06/2013 12/14/2015 01/15/2020 12/24/2020   Cholestrol 0 - 200 mg/dL - - - 174   LDLCALC 0 - 99 mg/dL - - - 119(H)   HDL >40 mg/dL - - - 32(L)   Trlycerides <150 mg/dL - - - 113   TCO2 22 - 32 mmol/L 24 23 27  -       Exercise Target Goals: Exercise Program Goal: Individual exercise prescription set using results from initial 6 min walk test and THRR while considering  patient's activity barriers and safety.   Exercise Prescription Goal: Initial exercise prescription builds to 30-45 minutes a day of aerobic activity, 2-3 days per week.  Home exercise guidelines will be given to patient during program as part of exercise prescription that the participant will acknowledge.   Education: Aerobic Exercise: - Group verbal and visual presentation on the components of exercise prescription. Introduces F.I.T.T principle from ACSM for exercise prescriptions.  Reviews F.I.T.T. principles of aerobic exercise including progression. Written material given at graduation.   Education: Resistance Exercise: - Group verbal and visual presentation on the components of exercise prescription. Introduces F.I.T.T principle from ACSM for exercise prescriptions  Reviews F.I.T.T. principles of resistance exercise including progression. Written material given at graduation.    Education: Exercise & Equipment Safety: - Individual verbal instruction and demonstration of equipment use and safety with use of the equipment. Flowsheet Row Cardiac Rehab from 01/20/2021 in [facility] Cardiac  and Pulmonary Rehab  Education need identified 01/20/21  Date 01/20/21  Educator [place]  Instruction Review Code 1- Verbalizes Understanding      Education: Exercise Physiology & General Exercise Guidelines: - Group verbal and written instruction with models to review the exercise physiology of the cardiovascular system and associated critical values. Provides general exercise guidelines with specific guidelines to those with heart or lung disease.    Education: Flexibility, Balance, Mind/Body Relaxation: - Group verbal and visual presentation with interactive activity on the components of exercise prescription. Introduces F.I.T.T principle from ACSM for exercise prescriptions. Reviews F.I.T.T. principles of flexibility and balance exercise training including progression. Also discusses the mind body connection.  Reviews various relaxation techniques to help reduce and manage stress (i.e. Deep breathing, progressive  muscle relaxation, and visualization). Balance handout provided to take home. Written material given at graduation.   Activity Barriers & Risk Stratification:  Activity Barriers & Cardiac Risk Stratification - 01/20/21 1241      Activity Barriers & Cardiac Risk Stratification   Activity Barriers Other (comment)    Comments Neuropathy- feet    Cardiac Risk Stratification High           6 Minute Walk:  6 Minute Walk    Row Name 01/20/21 1242         6 Minute Walk   Phase Initial     Distance 1360 feet     Walk Time 6 minutes     # of Rest Breaks 0     MPH 2.57     METS 2.79     RPE 9     Perceived Dyspnea  0     VO2 Peak 9.78     Symptoms No     Resting HR 65 bpm     Resting BP 118/68     Resting Oxygen Saturation  96 %     Exercise Oxygen Saturation  during 6 min walk 96 %     Max Ex. HR 75 bpm     Max Ex. BP 128/66     2 Minute Post BP 114/64            Oxygen Initial Assessment:   Oxygen Re-Evaluation:   Oxygen Discharge (Final Oxygen  Re-Evaluation):   Initial Exercise Prescription:  Initial Exercise Prescription - 01/20/21 1300      Date of Initial Exercise RX and Referring Provider   Date 01/20/21    Referring Provider [name]      Treadmill   MPH 2.6    Grade 1    Minutes 15    METs 3.35      Recumbant Bike   Level 2    RPM 60    Watts 20    Minutes 15    METs 2.7      NuStep   Level 3    SPM 80    Minutes 15    METs 2.7      T5 Nustep   Level 2    SPM 80    Minutes 15    METs 2.7      Track   Laps 35    Minutes 15    METs 2.9      Prescription Details   Frequency (times per week) 2    Duration Progress to 30 minutes of continuous aerobic without signs/symptoms of physical distress      Intensity   THRR 40-80% of Max Heartrate 97-129    Ratings of Perceived Exertion 11-13    Perceived Dyspnea 0-4      Progression   Progression Continue to progress workloads to maintain intensity without signs/symptoms of physical distress.      Resistance Training   Training Prescription Yes    Weight 5 lb    Reps 10-15           Perform Capillary Blood Glucose checks as needed.  Exercise Prescription Changes:  Exercise Prescription Changes    Row Name 01/20/21 1300 02/02/21 1400           Response to Exercise   Blood Pressure (Admit) 118/68 130/80      Blood Pressure (Exercise) 128/66 142/58      Blood Pressure (Exit) 114/64 130/70      Heart  Rate (Admit) 65 bpm 61 bpm      Heart Rate (Exercise) 76 bpm 86 bpm      Heart Rate (Exit) 60 bpm 70 bpm      Oxygen Saturation (Admit) 96 % --      Oxygen Saturation (Exercise) 96 % --      Oxygen Saturation (Exit) 96 % --      Rating of Perceived Exertion (Exercise) 9 11      Perceived Dyspnea (Exercise) 0 --      Symptoms none none      Comments walk test results --      Duration -- Continue with 30 min of aerobic exercise without signs/symptoms of physical distress.      Intensity -- THRR unchanged             Progression    Progression -- Continue to progress workloads to maintain intensity without signs/symptoms of physical distress.      Average METs -- 3.04             Resistance Training   Training Prescription -- Yes      Weight -- 5 lb      Reps -- 10-15             Interval Training   Interval Training -- No             Treadmill   MPH -- 2.6      Grade -- 2      Minutes -- 15      METs -- 3.71             Recumbant Bike   Level -- 4      Minutes -- 15             NuStep   Level -- 5      Minutes -- 15      METs -- 3.4             T5 Nustep   Level -- 3      Minutes -- 15      METs -- 2             Exercise Comments:  Exercise Comments    Row Name 01/22/21 [phone]           Exercise Comments First full day of exercise!  Patient was oriented to gym and equipment including functions, settings, policies, and procedures.  Patient's individual exercise prescription and treatment plan were reviewed.  All starting workloads were established based on the results of the 6 minute walk test done at initial orientation visit.  The plan for exercise progression was also introduced and progression will be customized based on patient's performance and goals.              Exercise Goals and Review:  Exercise Goals    Row Name 01/20/21 1304             Exercise Goals   Increase Physical Activity Yes       Intervention Provide advice, education, support and counseling about physical activity/exercise needs.;Develop an individualized exercise prescription for aerobic and resistive training based on initial evaluation findings, risk stratification, comorbidities and participant's personal goals.       Expected Outcomes Short Term: Attend rehab on a regular basis to increase amount of physical activity.;Long Term: Add in home exercise to make exercise part of routine and to increase amount of physical activity.;Long Term: Exercising regularly at  least 3-5 days a week.       Increase  Strength and Stamina Yes       Intervention Provide advice, education, support and counseling about physical activity/exercise needs.;Develop an individualized exercise prescription for aerobic and resistive training based on initial evaluation findings, risk stratification, comorbidities and participant's personal goals.       Expected Outcomes Short Term: Increase workloads from initial exercise prescription for resistance, speed, and METs.;Short Term: Perform resistance training exercises routinely during rehab and add in resistance training at home;Long Term: Improve cardiorespiratory fitness, muscular endurance and strength as measured by increased METs and functional capacity (6MWT)       Able to understand and use rate of perceived exertion (RPE) scale Yes       Intervention Provide education and explanation on how to use RPE scale       Expected Outcomes Short Term: Able to use RPE daily in rehab to express subjective intensity level;Long Term:  Able to use RPE to guide intensity level when exercising independently       Able to understand and use Dyspnea scale Yes       Intervention Provide education and explanation on how to use Dyspnea scale       Expected Outcomes Short Term: Able to use Dyspnea scale daily in rehab to express subjective sense of shortness of breath during exertion;Long Term: Able to use Dyspnea scale to guide intensity level when exercising independently       Knowledge and understanding of Target Heart Rate Range (THRR) Yes       Intervention Provide education and explanation of THRR including how the numbers were predicted and where they are located for reference       Expected Outcomes Short Term: Able to state/look up THRR;Short Term: Able to use daily as guideline for intensity in rehab;Long Term: Able to use THRR to govern intensity when exercising independently       Able to check pulse independently Yes       Intervention Provide education and demonstration on how  to check pulse in carotid and radial arteries.;Review the importance of being able to check your own pulse for safety during independent exercise       Expected Outcomes Short Term: Able to explain why pulse checking is important during independent exercise;Long Term: Able to check pulse independently and accurately       Understanding of Exercise Prescription Yes       Intervention Provide education, explanation, and written materials on patient's individual exercise prescription       Expected Outcomes Short Term: Able to explain program exercise prescription;Long Term: Able to explain home exercise prescription to exercise independently              Exercise Goals Re-Evaluation :  Exercise Goals Re-Evaluation    Row Name 01/22/21 0843 02/02/21 1448           Exercise Goal Re-Evaluation   Exercise Goals Review Able to understand and use rate of perceived exertion (RPE) scale;Able to understand and use Dyspnea scale;Knowledge and understanding of Target Heart Rate Range (THRR);Understanding of Exercise Prescription Increase Physical Activity;Increase Strength and Stamina;Understanding of Exercise Prescription      Comments Reviewed RPE and dyspnea scales, THR and program prescription with pt today.  Pt voiced understanding and was given a copy of goals to take home. Yvone Neu is off to a good start. He has even added in his third day!!  He is up to level  5 on NuStep and 2% grade on treadmill.  We will continue to monitor his progress.      Expected Outcomes Short: Use RPE daily to regulate intensity. Long: Follow program prescription in THR. Short: Continue to attend regularly Long: Continue to improve stamina.             Discharge Exercise Prescription (Final Exercise Prescription Changes):  Exercise Prescription Changes - 02/02/21 1400      Response to Exercise   Blood Pressure (Admit) 130/80    Blood Pressure (Exercise) 142/58    Blood Pressure (Exit) 130/70    Heart Rate (Admit) 61  bpm    Heart Rate (Exercise) 86 bpm    Heart Rate (Exit) 70 bpm    Rating of Perceived Exertion (Exercise) 11    Symptoms none    Duration Continue with 30 min of aerobic exercise without signs/symptoms of physical distress.    Intensity THRR unchanged      Progression   Progression Continue to progress workloads to maintain intensity without signs/symptoms of physical distress.    Average METs 3.04      Resistance Training   Training Prescription Yes    Weight 5 lb    Reps 10-15      Interval Training   Interval Training No      Treadmill   MPH 2.6    Grade 2    Minutes 15    METs 3.71      Recumbant Bike   Level 4    Minutes 15      NuStep   Level 5    Minutes 15    METs 3.4      T5 Nustep   Level 3    Minutes 15    METs 2           Nutrition:  Target Goals: Understanding of nutrition guidelines, daily intake of sodium 1500mg , cholesterol 200mg , calories 30% from fat and 7% or less from saturated fats, daily to have 5 or more servings of fruits and vegetables.  Education: All About Nutrition: -Group instruction provided by verbal, written material, interactive activities, discussions, models, and posters to present general guidelines for heart healthy nutrition including fat, fiber, MyPlate, the role of sodium in heart healthy nutrition, utilization of the nutrition label, and utilization of this knowledge for meal planning. Follow up email sent as well. Written material given at graduation.   Biometrics:  Pre Biometrics - 01/20/21 1241      Pre Biometrics   Height 5' 9.5" (1.765 m)    Weight 164 lb 11.2 oz (74.7 kg)    BMI (Calculated) 23.98    Single Leg Stand 30 seconds            Nutrition Therapy Plan and Nutrition Goals:   Nutrition Assessments:  MEDIFICTS Score Key:  ?70 Need to make dietary changes   40-70 Heart Healthy Diet  ? 40 Therapeutic Level Cholesterol Diet  Flowsheet Row Cardiac Rehab from 01/20/2021 in [facility] Cardiac and  Pulmonary Rehab  Picture Your Plate Total Score on Admission 61     Picture Your Plate Scores:  <73 Unhealthy dietary pattern with much room for improvement.  41-50 Dietary pattern unlikely to meet recommendations for good health and room for improvement.  51-60 More healthful dietary pattern, with some room for improvement.   >60 Healthy dietary pattern, although there may be some specific behaviors that could be improved.    Nutrition Goals Re-Evaluation:   Nutrition Goals  Discharge (Final Nutrition Goals Re-Evaluation):   Psychosocial: Target Goals: Acknowledge presence or absence of significant depression and/or stress, maximize coping skills, provide positive support system. Participant is able to verbalize types and ability to use techniques and skills needed for reducing stress and depression.   Education: Stress, Anxiety, and Depression - Group verbal and visual presentation to define topics covered.  Reviews how body is impacted by stress, anxiety, and depression.  Also discusses healthy ways to reduce stress and to treat/manage anxiety and depression.  Written material given at graduation.   Education: Sleep Hygiene -Provides group verbal and written instruction about how sleep can affect your health.  Define sleep hygiene, discuss sleep cycles and impact of sleep habits. Review good sleep hygiene tips.    Initial Review & Psychosocial Screening:  Initial Psych Review & Screening - 01/18/21 1111      Initial Review   Current issues with None Identified      Family Dynamics   Good Support System? Yes      Barriers   Psychosocial barriers to participate in program There are no identifiable barriers or psychosocial needs.      Screening Interventions   Interventions Encouraged to exercise;To provide support and resources with identified psychosocial needs;Provide feedback about the scores to participant    Expected Outcomes Short Term goal: Utilizing psychosocial  counselor, staff and physician to assist with identification of specific Stressors or current issues interfering with healing process. Setting desired goal for each stressor or current issue identified.;Long Term Goal: Stressors or current issues are controlled or eliminated.;Short Term goal: Identification and review with participant of any Quality of Life or Depression concerns found by scoring the questionnaire.;Long Term goal: The participant improves quality of Life and PHQ9 Scores as seen by post scores and/or verbalization of changes           Quality of Life Scores:   Quality of Life - 01/20/21 1253      Quality of Life   Select Quality of Life      Quality of Life Scores   Health/Function Pre 25.8 %    Socioeconomic Pre 29.64 %    Psych/Spiritual Pre 28.07 %    Family Pre 24.6 %    GLOBAL Pre 26.88 %          Scores of 19 and below usually indicate a poorer quality of life in these areas.  A difference of  2-3 points is a clinically meaningful difference.  A difference of 2-3 points in the total score of the Quality of Life Index has been associated with significant improvement in overall quality of life, self-image, physical symptoms, and general health in studies assessing change in quality of life.  PHQ-9: Recent Review Flowsheet Data    Depression screen [facility] 2/9 01/20/2021   Decreased Interest 0   Down, Depressed, Hopeless 0   PHQ - 2 Score 0   Altered sleeping 2   Tired, decreased energy 0   Change in appetite 0   Feeling bad or failure about yourself  0   Trouble concentrating 1   Moving slowly or fidgety/restless 0   Suicidal thoughts 0   PHQ-9 Score 3   Difficult doing work/chores Not difficult at all     Interpretation of Total Score  Total Score Depression Severity:  1-4 = Minimal depression, 5-9 = Mild depression, 10-14 = Moderate depression, 15-19 = Moderately severe depression, 20-27 = Severe depression   Psychosocial Evaluation and Intervention:   Psychosocial  Evaluation - 01/18/21 1119      Psychosocial Evaluation & Interventions   Interventions Encouraged to exercise with the program and follow exercise prescription    Comments 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 They are unsure why it went away, he said one doctor thinnks maybe the vasodilators and blood thinners. He was seeing a clinic for months trying to figure it all out, but he is taking it day by day and is hopeful it stays away. He sleeps well, enjoys golf, and doesn't report any major stressors. He wants to keep being active and is hopeful this program accompanies his lifestyle well.    Expected Outcomes Short: attend cardiac rehab for education and exercise. Long: develop positive self care habits.    Continue Psychosocial Services  Follow up required by staff           Psychosocial Re-Evaluation:   Psychosocial Discharge (Final Psychosocial Re-Evaluation):   Vocational Rehabilitation: Provide vocational rehab assistance to qualifying candidates.   Vocational Rehab Evaluation & Intervention:  Vocational Rehab - 01/18/21 1110      Initial Vocational Rehab Evaluation & Intervention   Assessment shows need for Vocational Rehabilitation No           Education: Education Goals: Education classes will be provided on a variety of topics geared toward better understanding of heart health and risk factor modification. Participant will state understanding/return demonstration of topics presented as noted by education test scores.  Learning Barriers/Preferences:  Learning Barriers/Preferences - 01/18/21 1109      Learning Barriers/Preferences   Learning Barriers None    Learning Preferences None           General Cardiac Education Topics:  AED/CPR: - Group verbal and written instruction with the use of models to demonstrate the basic use of the AED  with the basic ABC's of resuscitation.   Anatomy and Cardiac Procedures: - Group verbal and visual presentation and models provide information about basic cardiac anatomy and function. Reviews the testing methods done to diagnose heart disease and the outcomes of the test results. Describes the treatment choices: Medical Management, Angioplasty, or Coronary Bypass Surgery for treating various heart conditions including Myocardial Infarction, Angina, Valve Disease, and Cardiac Arrhythmias.  Written material given at graduation.   Medication Safety: - Group verbal and visual instruction to review commonly prescribed medications for heart and lung disease. Reviews the medication, class of the drug, and side effects. Includes the steps to properly store meds and maintain the prescription regimen.  Written material given at graduation.   Intimacy: - Group verbal instruction through game format to discuss how heart and lung disease can affect sexual intimacy. Written material given at graduation..   Know Your Numbers and Heart Failure: - Group verbal and visual instruction to discuss disease risk factors for cardiac and pulmonary disease and treatment options.  Reviews associated critical values for Overweight/Obesity, Hypertension, Cholesterol, and Diabetes.  Discusses basics of heart failure: signs/symptoms and treatments.  Introduces Heart Failure Zone chart for action plan for heart failure.  Written material given at graduation.   Infection Prevention: - Provides verbal and written material to individual with discussion of infection control including proper hand washing and proper equipment cleaning during exercise session. Flowsheet Row Cardiac Rehab from 01/20/2021 in [facility] Cardiac and Pulmonary Rehab  Education need identified 01/20/21  Date 01/20/21  Educator [place]  Instruction Review  Code 1- Verbalizes Understanding      Falls Prevention: - Provides verbal and written material to  individual with discussion of falls prevention and safety. Flowsheet Row Cardiac Rehab from 01/20/2021 in [facility] Cardiac and Pulmonary Rehab  Education need identified 01/20/21  Date 01/20/21  Educator [place]  Instruction Review Code 1- Verbalizes Understanding      Other: -Provides group and verbal instruction on various topics (see comments)   Knowledge Questionnaire Score:  Knowledge Questionnaire Score - 01/20/21 1257      Knowledge Questionnaire Score   Pre Score 24/26: Angina, Exercise           Core Components/Risk Factors/Patient Goals at Admission:  Personal Goals and Risk Factors at Admission - 01/20/21 1304      Core Components/Risk Factors/Patient Goals on Admission    Weight Management Yes;Weight Maintenance    Intervention Weight Management: Develop a combined nutrition and exercise program designed to reach desired caloric intake, while maintaining appropriate intake of nutrient and fiber, sodium and fats, and appropriate energy expenditure required for the weight goal.;Weight Management: Provide education and appropriate resources to help participant work on and attain dietary goals.;Weight Management/Obesity: Establish reasonable short term and long term weight goals.    Admit Weight 164 lb (74.4 kg)    Goal Weight: Short Term 164 lb (74.4 kg)    Goal Weight: Long Term 164 lb (74.4 kg)    Expected Outcomes Short Term: Continue to assess and modify interventions until short term weight is achieved;Long Term: Adherence to nutrition and physical activity/exercise program aimed toward attainment of established weight goal;Weight Maintenance: Understanding of the daily nutrition guidelines, which includes 25-35% calories from fat, 7% or less cal from saturated fats, less than 200mg  cholesterol, less than 1.5gm of sodium, & 5 or more servings of fruits and vegetables daily;Understanding recommendations for meals to include 15-35% energy as protein, 25-35% energy from fat, 35-60%  energy from carbohydrates, less than 200mg  of dietary cholesterol, 20-35 gm of total fiber daily;Understanding of distribution of calorie intake throughout the day with the consumption of 4-5 meals/snacks    Hypertension Yes    Intervention Provide education on lifestyle modifcations including regular physical activity/exercise, weight management, moderate sodium restriction and increased consumption of fresh fruit, vegetables, and low fat dairy, alcohol moderation, and smoking cessation.;Monitor prescription use compliance.    Expected Outcomes Short Term: Continued assessment and intervention until BP is < 140/12mm HG in hypertensive participants. < 130/78mm HG in hypertensive participants with diabetes, heart failure or chronic kidney disease.;Long Term: Maintenance of blood pressure at goal levels.    Lipids Yes    Intervention Provide education and support for participant on nutrition & aerobic/resistive exercise along with prescribed medications to achieve LDL 70mg , HDL >40mg .    Expected Outcomes Short Term: Participant states understanding of desired cholesterol values and is compliant with medications prescribed. Participant is following exercise prescription and nutrition guidelines.;Long Term: Cholesterol controlled with medications as prescribed, with individualized exercise RX and with personalized nutrition plan. Value goals: LDL < 70mg , HDL > 40 mg.           Education:Diabetes - Individual verbal and written instruction to review signs/symptoms of diabetes, desired ranges of glucose level fasting, after meals and with exercise. Acknowledge that pre and post exercise glucose checks will be done for 3 sessions at entry of program.   Core Components/Risk Factors/Patient Goals Review:    Core Components/Risk Factors/Patient Goals at Discharge (Final Review):    ITP Comments:  ITP  Comments    Row Name 01/18/21 1122 01/20/21 1237 01/22/21 0843 02/03/21 0605     ITP Comments  Initial telephone orienation completed. Diagnosis can be found in [facility] 4/19. EP orientation scheduled for Wednesday 5/4 at 11am. Completed 6MWT and gym orientation. Initial ITP created and sent for review to Dr. Emily Filbert, Medical Director. First full day of exercise!  Patient was oriented to gym and equipment including functions, settings, policies, and procedures.  Patient's individual exercise prescription and treatment plan were reviewed.  All starting workloads were established based on the results of the 6 minute walk test done at initial orientation visit.  The plan for exercise progression was also introduced and progression will be customized based on patient's performance and goals. 30 Day review completed. Medical Director ITP review done, changes made as directed, and signed approval by Medical Director.  New to program           Comments:

## 2021-02-05 ENCOUNTER — Other Ambulatory Visit: Payer: Self-pay

## 2021-02-05 ENCOUNTER — Encounter: Payer: Medicare Other | Admitting: *Deleted

## 2021-02-05 DIAGNOSIS — Z955 Presence of coronary angioplasty implant and graft: Secondary | ICD-10-CM

## 2021-02-05 DIAGNOSIS — I214 Non-ST elevation (NSTEMI) myocardial infarction: Secondary | ICD-10-CM

## 2021-02-05 NOTE — Progress Notes (Signed)
Daily Session Note  Patient Details  Name: Phillip [name] MRN: [id number] Date of Birth: 30-Aug-1946 Referring Provider:   Flowsheet Row Cardiac Rehab from 01/20/2021 in [facility] Cardiac and Pulmonary Rehab  Referring Provider [name]      Encounter Date: 02/05/2021  Check In:  Session Check In - 02/05/21 0845      Check-In   Supervising physician immediately available to respond to emergencies See telemetry face sheet for immediately available ER MD    Location [facility]-Cardiac & Pulmonary Rehab    Staff Present Alberteen Sam, MA, RCEP, CCRP, CCET;Joseph [place];Heath Lark, RN, BSN, CCRP    Virtual Visit No    Medication changes reported     No    Fall or balance concerns reported    No    Warm-up and Cool-down Performed on first and last piece of equipment    Resistance Training Performed Yes    VAD Patient? No    PAD/SET Patient? No      Pain Assessment   Currently in Pain? No/denies              Social History   Tobacco Use  Smoking Status Never Smoker  Smokeless Tobacco Never Used    Goals Met:  Independence with exercise equipment Exercise tolerated well No report of cardiac concerns or symptoms  Goals Unmet:  Not Applicable  Comments: Pt able to follow exercise prescription today without complaint.  Will continue to monitor for progression.    Dr. Emily Filbert is Medical Director for [place] and [facility].

## 2021-02-08 ENCOUNTER — Encounter: Payer: Medicare Other | Admitting: *Deleted

## 2021-02-08 ENCOUNTER — Other Ambulatory Visit: Payer: Self-pay

## 2021-02-08 DIAGNOSIS — I214 Non-ST elevation (NSTEMI) myocardial infarction: Secondary | ICD-10-CM | POA: Diagnosis not present

## 2021-02-08 DIAGNOSIS — Z955 Presence of coronary angioplasty implant and graft: Secondary | ICD-10-CM

## 2021-02-08 NOTE — Progress Notes (Signed)
Daily Session Note  Patient Details  Name: Phillip [name] MRN: [id number] Date of Birth: [birth year] Referring Provider:   Flowsheet Row Cardiac Rehab from 01/20/2021 in [facility] Cardiac and Pulmonary Rehab  Referring Provider [name]      Encounter Date: 02/08/2021  Check In:  Session Check In - 02/08/21 0839      Check-In   Supervising physician immediately available to respond to emergencies See telemetry face sheet for immediately available ER MD    Location [facility]-Cardiac & Pulmonary Rehab    Staff Present Heath Lark, RN, BSN, Laveda Norman, BS, ACSM CEP, Exercise Physiologist;Joseph Tessie Fass RCP,RRT,BSRT    Virtual Visit No    Medication changes reported     No    Fall or balance concerns reported    No    Warm-up and Cool-down Performed on first and last piece of equipment    Resistance Training Performed Yes    VAD Patient? No    PAD/SET Patient? No      Pain Assessment   Currently in Pain? No/denies              Social History   Tobacco Use  Smoking Status Never Smoker  Smokeless Tobacco Never Used    Goals Met:  Independence with exercise equipment Exercise tolerated well No report of cardiac concerns or symptoms  Goals Unmet:  Not Applicable  Comments: Pt able to follow exercise prescription today without complaint.  Will continue to monitor for progression.    Dr. Emily Filbert is Medical Director for [place].  Dr. Ottie Glazier is Medical Director for [facility] Pulmonary Rehabilitation.

## 2021-02-10 ENCOUNTER — Other Ambulatory Visit: Payer: Self-pay

## 2021-02-10 DIAGNOSIS — I214 Non-ST elevation (NSTEMI) myocardial infarction: Secondary | ICD-10-CM | POA: Diagnosis not present

## 2021-02-10 DIAGNOSIS — Z955 Presence of coronary angioplasty implant and graft: Secondary | ICD-10-CM

## 2021-02-10 NOTE — Progress Notes (Signed)
Daily Session Note  Patient Details  Name: Phillip [name] MRN: [id number] Date of Birth: 02/01/1946 Referring Provider:   Flowsheet Row Cardiac Rehab from 01/20/2021 in [facility] Cardiac and Pulmonary Rehab  Referring Provider [name]      Encounter Date: 02/10/2021  Check In:  Session Check In - 02/10/21 0755      Check-In   Supervising physician immediately available to respond to emergencies See telemetry face sheet for immediately available ER MD    Location [facility]-Cardiac & Pulmonary Rehab    Staff Present Birdie Sons, MPA, Elveria Rising, BA, ACSM CEP, Exercise Physiologist;Joseph Tessie Fass RCP,RRT,BSRT    Virtual Visit No    Medication changes reported     No    Fall or balance concerns reported    No    Warm-up and Cool-down Performed on first and last piece of equipment    Resistance Training Performed Yes    VAD Patient? No    PAD/SET Patient? No      Pain Assessment   Currently in Pain? No/denies              Social History   Tobacco Use  Smoking Status Never Smoker  Smokeless Tobacco Never Used    Goals Met:  Independence with exercise equipment Exercise tolerated well No report of cardiac concerns or symptoms Strength training completed today  Goals Unmet:  Not Applicable  Comments: Pt able to follow exercise prescription today without complaint.  Will continue to monitor for progression.    Dr. Emily Filbert is Medical Director for [place].  Dr. Ottie Glazier is Medical Director for [facility] Pulmonary Rehabilitation.

## 2021-02-12 ENCOUNTER — Encounter: Payer: Medicare Other | Admitting: *Deleted

## 2021-02-12 ENCOUNTER — Other Ambulatory Visit: Payer: Self-pay

## 2021-02-12 DIAGNOSIS — I214 Non-ST elevation (NSTEMI) myocardial infarction: Secondary | ICD-10-CM

## 2021-02-12 DIAGNOSIS — Z955 Presence of coronary angioplasty implant and graft: Secondary | ICD-10-CM

## 2021-02-12 NOTE — Progress Notes (Signed)
Daily Session Note  Patient Details  Name: Phillip [name] MRN: [id number] Date of Birth: 06/27/1946 Referring Provider:   Flowsheet Row Cardiac Rehab from 01/20/2021 in [facility] Cardiac and Pulmonary Rehab  Referring Provider [place]wood, Dwayne      Encounter Date: 02/12/2021  Check In:  Session Check In - 02/12/21 0840      Check-In   Supervising physician immediately available to respond to emergencies See telemetry face sheet for immediately available ER MD    Location [facility]-Cardiac & Pulmonary Rehab    Staff Present  , RN, BSN, CCRP;Jessica Hawkins, MA, RCEP, CCRP, CCET;Kara Langdon, MS, ASCM CEP, Exercise Physiologist    Virtual Visit No    Medication changes reported     No    Fall or balance concerns reported    No    Warm-up and Cool-down Performed on first and last piece of equipment    Resistance Training Performed Yes    VAD Patient? No    PAD/SET Patient? No      Pain Assessment   Currently in Pain? No/denies              Social History   Tobacco Use  Smoking Status Never Smoker  Smokeless Tobacco Never Used    Goals Met:  Independence with exercise equipment Exercise tolerated well No report of cardiac concerns or symptoms  Goals Unmet:  Not Applicable  Comments: Pt able to follow exercise prescription today without complaint.  Will continue to monitor for progression.    Dr. Mark Miller is Medical Director for [facility].  Dr. Fuad Aleskerov is Medical Director for [facility]. 

## 2021-02-19 ENCOUNTER — Other Ambulatory Visit: Payer: Self-pay

## 2021-02-19 ENCOUNTER — Encounter: Payer: Medicare Other | Attending: Internal Medicine | Admitting: *Deleted

## 2021-02-19 DIAGNOSIS — Z955 Presence of coronary angioplasty implant and graft: Secondary | ICD-10-CM | POA: Insufficient documentation

## 2021-02-19 DIAGNOSIS — I214 Non-ST elevation (NSTEMI) myocardial infarction: Secondary | ICD-10-CM | POA: Insufficient documentation

## 2021-02-19 NOTE — Progress Notes (Signed)
Daily Session Note  Patient Details  Name: Phillip [name] MRN: [id number] Date of Birth: July 12, 1946 Referring Provider:   Flowsheet Row Cardiac Rehab from 01/20/2021 in [facility] [place] Cardiac and Pulmonary Rehab  Referring Provider [name]      Encounter Date: 02/19/2021  Check In:  Session Check In - 02/19/21 0842      Check-In   Supervising physician immediately available to respond to emergencies See telemetry face sheet for immediately available ER MD    Location [facility]-Cardiac & Pulmonary Rehab    Staff Present Heath Lark, RN, BSN, CCRP;Jessica [place], MA, RCEP, CCRP, CCET;Joseph [place] RCP,RRT,BSRT    Virtual Visit No    Medication changes reported     No    Fall or balance concerns reported    No    Warm-up and Cool-down Performed on first and last piece of equipment    Resistance Training Performed Yes    VAD Patient? No    PAD/SET Patient? No      Pain Assessment   Currently in Pain? No/denies              Social History   Tobacco Use  Smoking Status Never Smoker  Smokeless Tobacco Never Used    Goals Met:  Independence with exercise equipment Exercise tolerated well No report of cardiac concerns or symptoms  Goals Unmet:  Not Applicable  Comments: Pt able to follow exercise prescription today without complaint.  Will continue to monitor for progression.    Dr. Emily Filbert is Medical Director for [place].  Dr. Ottie Glazier is Medical Director for [facility] Pulmonary Rehabilitation.

## 2021-02-22 ENCOUNTER — Encounter: Payer: Medicare Other | Admitting: *Deleted

## 2021-02-22 ENCOUNTER — Other Ambulatory Visit: Payer: Self-pay

## 2021-02-22 DIAGNOSIS — I214 Non-ST elevation (NSTEMI) myocardial infarction: Secondary | ICD-10-CM

## 2021-02-22 DIAGNOSIS — Z955 Presence of coronary angioplasty implant and graft: Secondary | ICD-10-CM

## 2021-02-22 NOTE — Progress Notes (Signed)
Daily Session Note  Patient Details  Name: Phillip [name] MRN: [id number] Date of Birth: 01/26/1946 Referring Provider:   Flowsheet Row Cardiac Rehab from 01/20/2021 in [facility] Cardiac and Pulmonary Rehab  Referring Provider [name]      Encounter Date: 02/22/2021  Check In:  Session Check In - 02/22/21 1151      Check-In   Supervising physician immediately available to respond to emergencies See telemetry face sheet for immediately available ER MD    Location [facility]-Cardiac & Pulmonary Rehab    Staff Present Heath Lark, RN, BSN, CCRP;Joseph [place] RCP,RRT,BSRT;Kelly [place], [place], ACSM CEP, Exercise Physiologist    Virtual Visit No    Medication changes reported     No    Fall or balance concerns reported    No    Warm-up and Cool-down Performed on first and last piece of equipment    Resistance Training Performed Yes    VAD Patient? No    PAD/SET Patient? No      Pain Assessment   Currently in Pain? No/denies              Social History   Tobacco Use  Smoking Status Never Smoker  Smokeless Tobacco Never Used    Goals Met:  Independence with exercise equipment Exercise tolerated well No report of cardiac concerns or symptoms  Goals Unmet:  Not Applicable  Comments: Pt able to follow exercise prescription today without complaint.  Will continue to monitor for progression.    Dr. Emily Filbert is Medical Director for [place].  Dr. Ottie Glazier is Medical Director for [facility] Pulmonary Rehabilitation.

## 2021-02-26 ENCOUNTER — Other Ambulatory Visit: Payer: Self-pay

## 2021-02-26 DIAGNOSIS — Z955 Presence of coronary angioplasty implant and graft: Secondary | ICD-10-CM

## 2021-02-26 DIAGNOSIS — I214 Non-ST elevation (NSTEMI) myocardial infarction: Secondary | ICD-10-CM

## 2021-02-26 NOTE — Progress Notes (Signed)
Daily Session Note  Patient Details  Name: Phillip [name] MRN: [id number] Date of Birth: 06/24/1946 Referring Provider:   Flowsheet Row Cardiac Rehab from 01/20/2021 in [facility] Cardiac and Pulmonary Rehab  Referring Provider [place]wood, Dwayne       Encounter Date: 02/26/2021  Check In:  Session Check In - 02/26/21 0817       Check-In   Supervising physician immediately available to respond to emergencies See telemetry face sheet for immediately available ER MD    Location [facility]-Cardiac & Pulmonary Rehab    Staff Present Joseph Hood RCP,RRT,BSRT;  RN, BSN;Jessica Hawkins, MA, RCEP, CCRP, CCET    Virtual Visit No    Medication changes reported     No    Fall or balance concerns reported    No    Warm-up and Cool-down Performed on first and last piece of equipment    Resistance Training Performed Yes    VAD Patient? No    PAD/SET Patient? No      Pain Assessment   Currently in Pain? No/denies                Social History   Tobacco Use  Smoking Status Never  Smokeless Tobacco Never    Goals Met:  Independence with exercise equipment Exercise tolerated well No report of cardiac concerns or symptoms Strength training completed today  Goals Unmet:  Not Applicable  Comments: Pt able to follow exercise prescription today without complaint.  Will continue to monitor for progression.   Dr. Mark Miller is Medical Director for [facility].  Dr. Fuad Aleskerov is Medical Director for [facility]. 

## 2021-03-01 ENCOUNTER — Other Ambulatory Visit: Payer: Self-pay

## 2021-03-01 ENCOUNTER — Encounter: Payer: Medicare Other | Admitting: *Deleted

## 2021-03-01 DIAGNOSIS — Z955 Presence of coronary angioplasty implant and graft: Secondary | ICD-10-CM

## 2021-03-01 DIAGNOSIS — I214 Non-ST elevation (NSTEMI) myocardial infarction: Secondary | ICD-10-CM | POA: Diagnosis not present

## 2021-03-01 NOTE — Progress Notes (Signed)
Daily Session Note  Patient Details  Name: Phillip [name] MRN: [id number] Date of Birth: Jul 13, 1946 Referring Provider:   Flowsheet Row Cardiac Rehab from 01/20/2021 in [facility] Cardiac and Pulmonary Rehab  Referring Provider [name]       Encounter Date: 03/01/2021  Check In:  Session Check In - 03/01/21 1223       Check-In   Supervising physician immediately available to respond to emergencies See telemetry face sheet for immediately available ER MD    Location [facility]-Cardiac & Pulmonary Rehab    Staff Present Heath Lark, RN, BSN, CCRP;Joseph Hood RCP,RRT,BSRT;Kelly [place], [place], ACSM CEP, Exercise Physiologist    Virtual Visit No    Medication changes reported     No    Fall or balance concerns reported    No    Warm-up and Cool-down Performed on first and last piece of equipment    Resistance Training Performed Yes    VAD Patient? No    PAD/SET Patient? No      Pain Assessment   Currently in Pain? No/denies                Social History   Tobacco Use  Smoking Status Never  Smokeless Tobacco Never    Goals Met:  Independence with exercise equipment Exercise tolerated well No report of cardiac concerns or symptoms  Goals Unmet:  Not Applicable  Comments: Pt able to follow exercise prescription today without complaint.  Will continue to monitor for progression.    Dr. Emily Filbert is Medical Director for [place].  Dr. Ottie Glazier is Medical Director for [facility] Pulmonary Rehabilitation.

## 2021-03-03 ENCOUNTER — Encounter: Payer: Self-pay | Admitting: *Deleted

## 2021-03-03 NOTE — Progress Notes (Signed)
Cardiac Individual Treatment Plan  Patient Details  Name: MAXEMILIANO RIEL MRN: [id number] Date of Birth: May 14, 1946 Referring Provider:   Flowsheet Row Cardiac Rehab from 01/20/2021 in [facility] Cardiac and Pulmonary Rehab  Referring Provider [name]       Initial Encounter Date:  Flowsheet Row Cardiac Rehab from 01/20/2021 in [facility] Cardiac and Pulmonary Rehab  Date 01/20/21       Visit Diagnosis: No diagnosis found.  Patient's Home Medications on Admission:  Current Outpatient Medications:    acetaminophen (TYLENOL) 500 MG tablet, Take 500 mg by mouth every 6 (six) hours as needed., Disp: , Rfl:    amLODipine (NORVASC) 5 MG tablet, Take 5 mg by mouth daily., Disp: , Rfl:    aspirin 81 MG EC tablet, Take 1 tablet (81 mg total) by mouth daily. Swallow whole., Disp: 30 tablet, Rfl: 0   atorvastatin (LIPITOR) 80 MG tablet, Take 1 tablet (80 mg total) by mouth daily., Disp: 90 tablet, Rfl: 3   cholecalciferol (VITAMIN D3) 25 MCG (1000 UT) tablet, Take 1,000 Units by mouth daily. (Patient not taking: Reported on 01/18/2021), Disp: , Rfl:    Chromium 1 MG CAPS, Take 1 mg by mouth daily. 1 per day, Disp: , Rfl:    clopidogrel (PLAVIX) 75 MG tablet, Take 1 tablet (75 mg total) by mouth daily with breakfast., Disp: 90 tablet, Rfl: 3   enoxaparin (LOVENOX) 80 MG/0.8ML injection, Inject 0.8 mLs (80 mg total) into the skin every 12 (twelve) hours. (Patient not taking: Reported on 01/18/2021), Disp: 3.2 mL, Rfl: 0   Glucosamine-Chondroit-Vit C-Mn (GLUCOSAMINE CHONDR 1500 COMPLX PO), Take 1 tablet by mouth every evening., Disp: , Rfl:    isosorbide mononitrate (IMDUR) 30 MG 24 hr tablet, Take 1 tablet (30 mg total) by mouth daily., Disp: 90 tablet, Rfl: 3   levothyroxine (SYNTHROID) 50 MCG tablet, Take 50 mcg by mouth. (Patient not taking: Reported on 01/18/2021), Disp: , Rfl:    loratadine (CLARITIN) 10 MG tablet, Take 10 mg by mouth in the morning and at bedtime., Disp: , Rfl:    metoprolol tartrate  (LOPRESSOR) 25 MG tablet, Take 0.5 tablets (12.5 mg total) by mouth 2 (two) times daily., Disp: 60 tablet, Rfl: 0   Multiple Vitamin (MULTIVITAMIN) tablet, Take 1 tablet by mouth daily., Disp: , Rfl:    niacin 500 MG tablet, Take 500 mg by mouth at bedtime. (Patient not taking: Reported on 01/18/2021), Disp: , Rfl:    nitroGLYCERIN (NITROSTAT) 0.4 MG SL tablet, Place 1 tablet (0.4 mg total) under the tongue every 5 (five) minutes x 3 doses as needed for chest pain., Disp: 25 tablet, Rfl: 3   pantoprazole (PROTONIX) 40 MG tablet, Take 1 tablet (40 mg total) by mouth daily., Disp: 90 tablet, Rfl: 3   potassium phosphate, monobasic, (K-PHOS ORIGINAL) 500 MG tablet, Take 500 mg by mouth daily., Disp: , Rfl:    TURMERIC PO, Take 1 tablet by mouth daily., Disp: , Rfl:    warfarin (COUMADIN) 4 MG tablet, Take 0.5-1 tablets (2-4 mg total) by mouth See admin instructions. Take 91m daily by mouth with the exception of 268mdaily by mouth on Mondays and Thursdays. Take 4 mg 01/07/21, then resume regular schedule., Disp: , Rfl:   Past Medical History: Past Medical History:  Diagnosis Date   Arthritis    Basal cell carcinoma    L clavicle, L prox forearm- removed years ago    [facility]   Bladder neck contracture  CAD (coronary artery disease)    a. 12/2020 NSTEMI/Cath: LM nl, LAD 95ost/p, LCX 50p, RCA nl. EF 45-50%.   Cataract    CKD (chronic kidney disease), stage III [facility])    ED (erectile dysfunction)    Frequency    GERD (gastroesophageal reflux disease)    History of pulmonary embolus (PE) 11/2018   a. Following LE DVT-->Chronic warfarin.   Hyperlipidemia LDL goal <70    Hypertension    Hypothyroidism    Incontinence of urine    sui, s/p cryoablation   Ischemic cardiomyopathy    a. 11/2018 Echo: EF 60-65%; b. 12/2020 LV Gram: EF 45-50% in setting of NSTEMI.   Kidney stones    Neuropathy    feet   Nocturia    Prostate cancer ([place])    S/P   CRYOABLATION   Right elbow tendinitis     Right Lower Extremity DVT (deep venous thrombosis) (HCC) 11/25/2018   Vertigo    1-2x/yr   Wears glasses     Tobacco Use: Social History   Tobacco Use  Smoking Status Never  Smokeless Tobacco Never    Labs: Recent Review Flowsheet Data     Labs for ITP Cardiac and Pulmonary Rehab Latest Ref Rng & Units 12/06/2013 12/14/2015 01/15/2020 12/24/2020   Cholestrol 0 - 200 mg/dL - - - 174   LDLCALC 0 - 99 mg/dL - - - 119(H)   HDL >40 mg/dL - - - 32(L)   Trlycerides <150 mg/dL - - - 113   TCO2 22 - 32 mmol/L 24 23 27  -        Exercise Target Goals: Exercise Program Goal: Individual exercise prescription set using results from initial 6 min walk test and THRR while considering  patient's activity barriers and safety.   Exercise Prescription Goal: Initial exercise prescription builds to 30-45 minutes a day of aerobic activity, 2-3 days per week.  Home exercise guidelines will be given to patient during program as part of exercise prescription that the participant will acknowledge.   Education: Aerobic Exercise: - Group verbal and visual presentation on the components of exercise prescription. Introduces F.I.T.T principle from ACSM for exercise prescriptions.  Reviews F.I.T.T. principles of aerobic exercise including progression. Written material given at graduation.   Education: Resistance Exercise: - Group verbal and visual presentation on the components of exercise prescription. Introduces F.I.T.T principle from ACSM for exercise prescriptions  Reviews F.I.T.T. principles of resistance exercise including progression. Written material given at graduation.    Education: Exercise & Equipment Safety: - Individual verbal instruction and demonstration of equipment use and safety with use of the equipment. Flowsheet Row Cardiac Rehab from 02/03/2021 in [facility] Cardiac and Pulmonary Rehab  Education need identified 01/20/21  Date 01/20/21  Educator [place]  Instruction Review Code 1- Verbalizes  Understanding       Education: Exercise Physiology & General Exercise Guidelines: - Group verbal and written instruction with models to review the exercise physiology of the cardiovascular system and associated critical values. Provides general exercise guidelines with specific guidelines to those with heart or lung disease.    Education: Flexibility, Balance, Mind/Body Relaxation: - Group verbal and visual presentation with interactive activity on the components of exercise prescription. Introduces F.I.T.T principle from ACSM for exercise prescriptions. Reviews F.I.T.T. principles of flexibility and balance exercise training including progression. Also discusses the mind body connection.  Reviews various relaxation techniques to help reduce and manage stress (i.e. Deep breathing, progressive muscle relaxation, and visualization). Balance handout provided to take  home. Written material given at graduation.   Activity Barriers & Risk Stratification:  Activity Barriers & Cardiac Risk Stratification - 01/20/21 1241       Activity Barriers & Cardiac Risk Stratification   Activity Barriers Other (comment)    Comments Neuropathy- feet    Cardiac Risk Stratification High             6 Minute Walk:  6 Minute Walk     Row Name 01/20/21 1242         6 Minute Walk   Phase Initial     Distance 1360 feet     Walk Time 6 minutes     # of Rest Breaks 0     MPH 2.57     METS 2.79     RPE 9     Perceived Dyspnea  0     VO2 Peak 9.78     Symptoms No     Resting HR 65 bpm     Resting BP 118/68     Resting Oxygen Saturation  96 %     Exercise Oxygen Saturation  during 6 min walk 96 %     Max Ex. HR 75 bpm     Max Ex. BP 128/66     2 Minute Post BP 114/64              Oxygen Initial Assessment:   Oxygen Re-Evaluation:   Oxygen Discharge (Final Oxygen Re-Evaluation):   Initial Exercise Prescription:  Initial Exercise Prescription - 01/20/21 1300       Date of  Initial Exercise RX and Referring Provider   Date 01/20/21    Referring Provider [name]      Treadmill   MPH 2.6    Grade 1    Minutes 15    METs 3.35      Recumbant Bike   Level 2    RPM 60    Watts 20    Minutes 15    METs 2.7      NuStep   Level 3    SPM 80    Minutes 15    METs 2.7      T5 Nustep   Level 2    SPM 80    Minutes 15    METs 2.7      Track   Laps 35    Minutes 15    METs 2.9      Prescription Details   Frequency (times per week) 2    Duration Progress to 30 minutes of continuous aerobic without signs/symptoms of physical distress      Intensity   THRR 40-80% of Max Heartrate 97-129    Ratings of Perceived Exertion 11-13    Perceived Dyspnea 0-4      Progression   Progression Continue to progress workloads to maintain intensity without signs/symptoms of physical distress.      Resistance Training   Training Prescription Yes    Weight 5 lb    Reps 10-15             Perform Capillary Blood Glucose checks as needed.  Exercise Prescription Changes:   Exercise Prescription Changes     Row Name 01/20/21 1300 02/02/21 1400 02/12/21 0800 02/16/21 1300 03/01/21 1200     Response to Exercise   Blood Pressure (Admit) 118/68 130/80 -- 124/72 128/64   Blood Pressure (Exercise) 128/66 142/58 -- 130/64 152/64   Blood Pressure (Exit) 114/64 130/70 -- 104/66 108/60  Heart Rate (Admit) 65 bpm 61 bpm -- 67 bpm 71 bpm   Heart Rate (Exercise) 76 bpm 86 bpm -- 78 bpm 102 bpm   Heart Rate (Exit) 60 bpm 70 bpm -- 59 bpm 73 bpm   Oxygen Saturation (Admit) 96 % -- -- -- --   Oxygen Saturation (Exercise) 96 % -- -- -- --   Oxygen Saturation (Exit) 96 % -- -- -- --   Rating of Perceived Exertion (Exercise) 9 11 -- 13 14   Perceived Dyspnea (Exercise) 0 -- -- -- --   Symptoms none none -- none none   Comments walk test results -- -- -- --   Duration -- Continue with 30 min of aerobic exercise without signs/symptoms of physical distress. --  Continue with 30 min of aerobic exercise without signs/symptoms of physical distress. Continue with 30 min of aerobic exercise without signs/symptoms of physical distress.   Intensity -- THRR unchanged -- THRR unchanged THRR unchanged     Progression   Progression -- Continue to progress workloads to maintain intensity without signs/symptoms of physical distress. -- Continue to progress workloads to maintain intensity without signs/symptoms of physical distress. Continue to progress workloads to maintain intensity without signs/symptoms of physical distress.   Average METs -- 3.04 -- 2.4 4.53     Resistance Training   Training Prescription -- Yes -- Yes Yes   Weight -- 5 lb -- 5 lb 7 lb   Reps -- 10-15 -- 10-15 10-15     Interval Training   Interval Training -- No -- No No     Treadmill   MPH -- 2.6 -- -- 3.6   Grade -- 2 -- -- 4.5   Minutes -- 15 -- -- 15   METs -- 3.71 -- -- 5.99     Recumbant Bike   Level -- 4 -- 4 4   RPM -- -- -- -- 50   Watts -- -- -- -- 34   Minutes -- 15 -- 15 15   METs -- -- -- 2.85 2.83     NuStep   Level -- 5 -- -- 5   SPM -- -- -- -- 80   Minutes -- 15 -- -- 15   METs -- 3.4 -- -- 3.5     T5 Nustep   Level -- 3 -- 3 --   Minutes -- 15 -- 15 --   METs -- 2 -- 2 --     Home Exercise Plan   Plans to continue exercise at -- -- [employer] (comment)  [employer] (comment)  [employer] (comment)  [employer]   Frequency -- -- Add 2 additional days to program exercise sessions. Add 2 additional days to program exercise sessions. Add 2 additional days to program exercise sessions.   Initial Home Exercises Provided -- -- 02/12/21 02/12/21 02/12/21            Exercise Comments:   Exercise Comments     Row Name 01/22/21 [phone]           Exercise Comments First full day of exercise!  Patient was oriented to gym and equipment including functions, settings, policies, and procedures.  Patient's individual exercise  prescription and treatment plan were reviewed.  All starting workloads were established based on the results of the 6 minute walk test done at initial orientation visit.  The plan for exercise progression was also introduced and progression will be customized based on patient's performance and goals.  Exercise Goals and Review:   Exercise Goals     Row Name 01/20/21 1304             Exercise Goals   Increase Physical Activity Yes       Intervention Provide advice, education, support and counseling about physical activity/exercise needs.;Develop an individualized exercise prescription for aerobic and resistive training based on initial evaluation findings, risk stratification, comorbidities and participant's personal goals.       Expected Outcomes Short Term: Attend rehab on a regular basis to increase amount of physical activity.;Long Term: Add in home exercise to make exercise part of routine and to increase amount of physical activity.;Long Term: Exercising regularly at least 3-5 days a week.       Increase Strength and Stamina Yes       Intervention Provide advice, education, support and counseling about physical activity/exercise needs.;Develop an individualized exercise prescription for aerobic and resistive training based on initial evaluation findings, risk stratification, comorbidities and participant's personal goals.       Expected Outcomes Short Term: Increase workloads from initial exercise prescription for resistance, speed, and METs.;Short Term: Perform resistance training exercises routinely during rehab and add in resistance training at home;Long Term: Improve cardiorespiratory fitness, muscular endurance and strength as measured by increased METs and functional capacity (6MWT)       Able to understand and use rate of perceived exertion (RPE) scale Yes       Intervention Provide education and explanation on how to use RPE scale       Expected Outcomes Short  Term: Able to use RPE daily in rehab to express subjective intensity level;Long Term:  Able to use RPE to guide intensity level when exercising independently       Able to understand and use Dyspnea scale Yes       Intervention Provide education and explanation on how to use Dyspnea scale       Expected Outcomes Short Term: Able to use Dyspnea scale daily in rehab to express subjective sense of shortness of breath during exertion;Long Term: Able to use Dyspnea scale to guide intensity level when exercising independently       Knowledge and understanding of Target Heart Rate Range (THRR) Yes       Intervention Provide education and explanation of THRR including how the numbers were predicted and where they are located for reference       Expected Outcomes Short Term: Able to state/look up THRR;Short Term: Able to use daily as guideline for intensity in rehab;Long Term: Able to use THRR to govern intensity when exercising independently       Able to check pulse independently Yes       Intervention Provide education and demonstration on how to check pulse in carotid and radial arteries.;Review the importance of being able to check your own pulse for safety during independent exercise       Expected Outcomes Short Term: Able to explain why pulse checking is important during independent exercise;Long Term: Able to check pulse independently and accurately       Understanding of Exercise Prescription Yes       Intervention Provide education, explanation, and written materials on patient's individual exercise prescription       Expected Outcomes Short Term: Able to explain program exercise prescription;Long Term: Able to explain home exercise prescription to exercise independently                Exercise Goals Re-Evaluation :  Exercise  Goals Re-Evaluation     Row Name 01/22/21 [phone] 02/02/21 1448 02/12/21 0838 02/16/21 1336 02/19/21 0817     Exercise Goal Re-Evaluation   Exercise Goals Review Able  to understand and use rate of perceived exertion (RPE) scale;Able to understand and use Dyspnea scale;Knowledge and understanding of Target Heart Rate Range (THRR);Understanding of Exercise Prescription Increase Physical Activity;Increase Strength and Stamina;Understanding of Exercise Prescription Increase Physical Activity;Increase Strength and Stamina;Understanding of Exercise Prescription Increase Physical Activity;Increase Strength and Stamina Increase Physical Activity;Increase Strength and Stamina   Comments Reviewed RPE and dyspnea scales, THR and program prescription with pt today.  Pt voiced understanding and was given a copy of goals to take home. Yvone Neu is off to a good start. He has even added in his third day!!  He is up to level 5 on NuStep and 2% grade on treadmill.  We will continue to monitor his progress. Reviewed home exercise with pt today.  Pt plans to go the [facility] for exercise. Really encouraged aerobic exericse outside of rehab to help maintain his endurance and continue resistance training as well. Patient plans to buy a pulse ox to check his HR. Reviewed THR, pulse, RPE, sign and symptoms, pulse oximetery and when to call 911 or MD.  Also discussed weather considerations and indoor options.  Pt voiced understanding. Yvone Neu attends consistently and works at [employer].  He has increased workloads on machines.  Staff will monitor progress. [facility] and [facility]. [facility]. [facility]   Expected Outcomes Short: Use RPE daily to regulate intensity. Long: Follow program prescription in THR. Short: Continue to attend regularly Long: Continue to improve stamina. Short: Check HR during exercise Long: Exercise at home at appropriate exercise prescription Short:  coninue to attend consistently Long:  build overall stamina Short: continue to improve in [facility],  Long: maintain exercise independently.    [place] Name 03/01/21 1237             Exercise Goal Re-Evaluation   Exercise Goals Review Increase Physical Activity;Increase Strength and Stamina       Comments Yvone Neu is doing great. He has increased his resistance training to 7 lb and increased his treadmill speed/incline on treadmill to 3.6/4.5%. Will continue to monitor.       Expected Outcomes Short: Continue to increase loads on machines Long: Continue to improve overall MET level                Discharge Exercise Prescription (Final Exercise Prescription Changes):  Exercise Prescription Changes - 03/01/21 1200       Response to Exercise   Blood Pressure (Admit) 128/64    Blood Pressure (Exercise) 152/64    Blood Pressure (Exit) 108/60    Heart Rate (Admit) 71 bpm    Heart Rate (Exercise) 102 bpm    Heart Rate (Exit) 73 bpm    Rating of Perceived Exertion (Exercise) 14    Symptoms none    Duration Continue with 30 min of aerobic exercise without signs/symptoms of physical distress.    Intensity THRR unchanged      Progression   Progression Continue to progress workloads to maintain intensity without signs/symptoms of physical distress.    Average METs 4.53      Resistance Training   Training Prescription Yes  Weight 7 lb    Reps 10-15      Interval Training   Interval Training No      Treadmill   MPH 3.6    Grade 4.5    Minutes 15    METs 5.99      Recumbant Bike   Level 4    RPM 50    Watts 34    Minutes 15    METs 2.83      NuStep   Level 5    SPM 80    Minutes 15    METs 3.5      Home Exercise Plan   Plans to continue exercise at [employer] (comment)   [employer]   Frequency Add 2 additional days to program exercise sessions.    Initial Home Exercises Provided 02/12/21             Nutrition:  Target Goals: Understanding of nutrition guidelines, daily intake of sodium <1558m, cholesterol <2058m calories 30% from fat and 7% or less from  saturated fats, daily to have 5 or more servings of fruits and vegetables.  Education: All About Nutrition: -Group instruction provided by verbal, written material, interactive activities, discussions, models, and posters to present general guidelines for heart healthy nutrition including fat, fiber, MyPlate, the role of sodium in heart healthy nutrition, utilization of the nutrition label, and utilization of this knowledge for meal planning. Follow up email sent as well. Written material given at graduation.   Biometrics:  Pre Biometrics - 01/20/21 1241       Pre Biometrics   Height 5' 9.5" (1.765 m)    Weight 164 lb 11.2 oz (74.7 kg)    BMI (Calculated) 23.98    Single Leg Stand 30 seconds              Nutrition Therapy Plan and Nutrition Goals:  Nutrition Therapy & Goals - 02/08/21 0721       Nutrition Therapy   Diet Heart healthy, low Na    Drug/Food Interactions Coumadin/Vit K;Statins/Certain Fruits    Protein (specify units) 60g    Fiber 30 grams    Whole Grain Foods 3 servings    Saturated Fats 12 max. grams    Fruits and Vegetables 8 servings/day    Sodium 1.5 grams      Personal Nutrition Goals   Nutrition Goal ST:LT: KeYvone Neuould not like to make any changes at this time.    Comments B: oatmeal L: sandwich ([place] whole wheat - most of the time) D: meat (chicken or salmon patties - will had red meat 1-2x/week) and a couple of vegetables (beans, leafy greens (not as often due to warfarin), potatoes, salads. He will use canola oil and butter. they use mrs. dash. He reports using almond milk and limits dairy. He also reports drinking sugar free lemonaide and decaf coffee with no sugar creamer during the day as well as water - not as much as he woiuld like. Discussed heart healthy eating and vitamin K content of foods - provided handouts. Patient would not like to make any changes at this time.      Intervention Plan   Intervention Prescribe, educate and counsel  regarding individualized specific dietary modifications aiming towards targeted core components such as weight, hypertension, lipid management, diabetes, heart failure and other comorbidities.;Nutrition handout(s) given to patient.    Expected Outcomes Short Term Goal: Understand basic principles of dietary content, such as calories, fat, sodium, cholesterol and nutrients.;Short Term Goal:  A plan has been developed with personal nutrition goals set during dietitian appointment.;Long Term Goal: Adherence to prescribed nutrition plan.             Nutrition Assessments:  MEDIFICTS Score Key: ?70 Need to make dietary changes  40-70 Heart Healthy Diet ? 40 Therapeutic Level Cholesterol Diet  Flowsheet Row Cardiac Rehab from 01/20/2021 in [facility] Cardiac and Pulmonary Rehab  Picture Your Plate Total Score on Admission 61      Picture Your Plate Scores: <80 Unhealthy dietary pattern with much room for improvement. 41-50 Dietary pattern unlikely to meet recommendations for good health and room for improvement. 51-60 More healthful dietary pattern, with some room for improvement.  >60 Healthy dietary pattern, although there may be some specific behaviors that could be improved.    Nutrition Goals Re-Evaluation:  Nutrition Goals Re-Evaluation     [place] Name 02/19/21 0823             Goals   Current Weight 167 lb (75.8 kg)       Nutrition Goal Maintain weight.       Comment Yvone Neu has been trying to eat less pork and has changed some of his eating habits. He states that he could eat some more vegatables.       Expected Outcome Short: maintain current weight. Long: stay on a diet that pertains to his health.                Nutrition Goals Discharge (Final Nutrition Goals Re-Evaluation):  Nutrition Goals Re-Evaluation - 02/19/21 0823       Goals   Current Weight 167 lb (75.8 kg)    Nutrition Goal Maintain weight.    Comment Yvone Neu has been trying to eat less pork and has changed some of  his eating habits. He states that he could eat some more vegatables.    Expected Outcome Short: maintain current weight. Long: stay on a diet that pertains to his health.             Psychosocial: Target Goals: Acknowledge presence or absence of significant depression and/or stress, maximize coping skills, provide positive support system. Participant is able to verbalize types and ability to use techniques and skills needed for reducing stress and depression.   Education: Stress, Anxiety, and Depression - Group verbal and visual presentation to define topics covered.  Reviews how body is impacted by stress, anxiety, and depression.  Also discusses healthy ways to reduce stress and to treat/manage anxiety and depression.  Written material given at graduation.   Education: Sleep Hygiene -Provides group verbal and written instruction about how sleep can affect your health.  Define sleep hygiene, discuss sleep cycles and impact of sleep habits. Review good sleep hygiene tips.    Initial Review & Psychosocial Screening:  Initial Psych Review & Screening - 01/18/21 1111       Initial Review   Current issues with None Identified      Family Dynamics   Good Support System? Yes      Barriers   Psychosocial barriers to participate in program There are no identifiable barriers or psychosocial needs.      Screening Interventions   Interventions Encouraged to exercise;To provide support and resources with identified psychosocial needs;Provide feedback about the scores to participant    Expected Outcomes Short Term goal: Utilizing psychosocial counselor, staff and physician to assist with identification of specific Stressors or current issues interfering with healing process. Setting desired goal for each stressor or  current issue identified.;Long Term Goal: Stressors or current issues are controlled or eliminated.;Short Term goal: Identification and review with participant of any Quality of  Life or Depression concerns found by scoring the questionnaire.;Long Term goal: The participant improves quality of Life and PHQ9 Scores as seen by post scores and/or verbalization of changes             Quality of Life Scores:   Quality of Life - 01/20/21 1253       Quality of Life   Select Quality of Life      Quality of Life Scores   Health/Function Pre 25.8 %    Socioeconomic Pre 29.64 %    Psych/Spiritual Pre 28.07 %    Family Pre 24.6 %    GLOBAL Pre 26.88 %            Scores of 19 and below usually indicate a poorer quality of life in these areas.  A difference of  2-3 points is a clinically meaningful difference.  A difference of 2-3 points in the total score of the Quality of Life Index has been associated with significant improvement in overall quality of life, self-image, physical symptoms, and general health in studies assessing change in quality of life.  PHQ-9: Recent Review Flowsheet Data     Depression screen [facility] 2/9 01/20/2021   Decreased Interest 0   Down, Depressed, Hopeless 0   PHQ - 2 Score 0   Altered sleeping 2   Tired, decreased energy 0   Change in appetite 0   Feeling bad or failure about yourself  0   Trouble concentrating 1   Moving slowly or fidgety/restless 0   Suicidal thoughts 0   PHQ-9 Score 3   Difficult doing work/chores Not difficult at all      Interpretation of Total Score  Total Score Depression Severity:  1-4 = Minimal depression, 5-9 = Mild depression, 10-14 = Moderate depression, 15-19 = Moderately severe depression, 20-27 = Severe depression   Psychosocial Evaluation and Intervention:  Psychosocial Evaluation - 01/18/21 1119       Psychosocial Evaluation & Interventions   Interventions Encouraged to exercise with the program and follow exercise prescription    Comments 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 They  are unsure why it went away, he said one doctor thinnks maybe the vasodilators and blood thinners. He was seeing a clinic for months trying to figure it all out, but he is taking it day by day and is hopeful it stays away. He sleeps well, enjoys golf, and doesn't report any major stressors. He wants to keep being active and is hopeful this program accompanies his lifestyle well.    Expected Outcomes Short: attend cardiac rehab for education and exercise. Long: develop positive self care habits.    Continue Psychosocial Services  Follow up required by staff             Psychosocial Re-Evaluation:  Psychosocial Re-Evaluation     [place] Name 02/19/21 0825             Psychosocial Re-Evaluation   Current issues with None Identified       Comments Patient reports no issues with their current mental states, sleep, stress, depression or anxiety. Will follow up with patient in a few weeks for any changes.       Expected Outcomes Short: Continue to  exercise regularly to support mental health and notify staff of any changes. Long: maintain mental health and well being through teaching of rehab or prescribed medications independently.       Interventions Encouraged to attend Cardiac Rehabilitation for the exercise       Continue Psychosocial Services  Follow up required by staff                Psychosocial Discharge (Final Psychosocial Re-Evaluation):  Psychosocial Re-Evaluation - 02/19/21 0825       Psychosocial Re-Evaluation   Current issues with None Identified    Comments Patient reports no issues with their current mental states, sleep, stress, depression or anxiety. Will follow up with patient in a few weeks for any changes.    Expected Outcomes Short: Continue to exercise regularly to support mental health and notify staff of any changes. Long: maintain mental health and well being through teaching of rehab or prescribed medications independently.    Interventions Encouraged to  attend Cardiac Rehabilitation for the exercise    Continue Psychosocial Services  Follow up required by staff             Vocational Rehabilitation: Provide vocational rehab assistance to qualifying candidates.   Vocational Rehab Evaluation & Intervention:  Vocational Rehab - 01/18/21 1110       Initial Vocational Rehab Evaluation & Intervention   Assessment shows need for Vocational Rehabilitation No             Education: Education Goals: Education classes will be provided on a variety of topics geared toward better understanding of heart health and risk factor modification. Participant will state understanding/return demonstration of topics presented as noted by education test scores.  Learning Barriers/Preferences:  Learning Barriers/Preferences - 01/18/21 1109       Learning Barriers/Preferences   Learning Barriers None    Learning Preferences None             General Cardiac Education Topics:  AED/CPR: - Group verbal and written instruction with the use of models to demonstrate the basic use of the AED with the basic ABC's of resuscitation.   Anatomy and Cardiac Procedures: - Group verbal and visual presentation and models provide information about basic cardiac anatomy and function. Reviews the testing methods done to diagnose heart disease and the outcomes of the test results. Describes the treatment choices: Medical Management, Angioplasty, or Coronary Bypass Surgery for treating various heart conditions including Myocardial Infarction, Angina, Valve Disease, and Cardiac Arrhythmias.  Written material given at graduation.   Medication Safety: - Group verbal and visual instruction to review commonly prescribed medications for heart and lung disease. Reviews the medication, class of the drug, and side effects. Includes the steps to properly store meds and maintain the prescription regimen.  Written material given at graduation. Flowsheet Row Cardiac Rehab  from 02/03/2021 in [facility] Cardiac and Pulmonary Rehab  Date 02/03/21  Educator [place]  Instruction Review Code 1- Verbalizes Understanding       Intimacy: - Group verbal instruction through game format to discuss how heart and lung disease can affect sexual intimacy. Written material given at graduation..   Know Your Numbers and Heart Failure: - Group verbal and visual instruction to discuss disease risk factors for cardiac and pulmonary disease and treatment options.  Reviews associated critical values for Overweight/Obesity, Hypertension, Cholesterol, and Diabetes.  Discusses basics of heart failure: signs/symptoms and treatments.  Introduces Heart Failure Zone chart for action plan for heart failure.  Written material given  at graduation.   Infection Prevention: - Provides verbal and written material to individual with discussion of infection control including proper hand washing and proper equipment cleaning during exercise session. Flowsheet Row Cardiac Rehab from 02/03/2021 in [facility] Cardiac and Pulmonary Rehab  Education need identified 01/20/21  Date 01/20/21  Educator [place]  Instruction Review Code 1- Verbalizes Understanding       Falls Prevention: - Provides verbal and written material to individual with discussion of falls prevention and safety. Flowsheet Row Cardiac Rehab from 02/03/2021 in [facility] Cardiac and Pulmonary Rehab  Education need identified 01/20/21  Date 01/20/21  Educator [place]  Instruction Review Code 1- Verbalizes Understanding       Other: -Provides group and verbal instruction on various topics (see comments)   Knowledge Questionnaire Score:  Knowledge Questionnaire Score - 01/20/21 1257       Knowledge Questionnaire Score   Pre Score 24/26: Angina, Exercise             Core Components/Risk Factors/Patient Goals at Admission:  Personal Goals and Risk Factors at Admission - 01/20/21 1304       Core Components/Risk Factors/Patient Goals on Admission     Weight Management Yes;Weight Maintenance    Intervention Weight Management: Develop a combined nutrition and exercise program designed to reach desired caloric intake, while maintaining appropriate intake of nutrient and fiber, sodium and fats, and appropriate energy expenditure required for the weight goal.;Weight Management: Provide education and appropriate resources to help participant work on and attain dietary goals.;Weight Management/Obesity: Establish reasonable short term and long term weight goals.    Admit Weight 164 lb (74.4 kg)    Goal Weight: Short Term 164 lb (74.4 kg)    Goal Weight: Long Term 164 lb (74.4 kg)    Expected Outcomes Short Term: Continue to assess and modify interventions until short term weight is achieved;Long Term: Adherence to nutrition and physical activity/exercise program aimed toward attainment of established weight goal;Weight Maintenance: Understanding of the daily nutrition guidelines, which includes 25-35% calories from fat, 7% or less cal from saturated fats, less than 238m cholesterol, less than 1.5gm of sodium, & 5 or more servings of fruits and vegetables daily;Understanding recommendations for meals to include 15-35% energy as protein, 25-35% energy from fat, 35-60% energy from carbohydrates, less than 2018mof dietary cholesterol, 20-35 gm of total fiber daily;Understanding of distribution of calorie intake throughout the day with the consumption of 4-5 meals/snacks    Hypertension Yes    Intervention Provide education on lifestyle modifcations including regular physical activity/exercise, weight management, moderate sodium restriction and increased consumption of fresh fruit, vegetables, and low fat dairy, alcohol moderation, and smoking cessation.;Monitor prescription use compliance.    Expected Outcomes Short Term: Continued assessment and intervention until BP is < 140/9069mG in hypertensive participants. < 130/62m81m in hypertensive participants  with diabetes, heart failure or chronic kidney disease.;Long Term: Maintenance of blood pressure at goal levels.    Lipids Yes    Intervention Provide education and support for participant on nutrition & aerobic/resistive exercise along with prescribed medications to achieve LDL <70mg5mL >40mg.47mExpected Outcomes Short Term: Participant states understanding of desired cholesterol values and is compliant with medications prescribed. Participant is following exercise prescription and nutrition guidelines.;Long Term: Cholesterol controlled with medications as prescribed, with individualized exercise RX and with personalized nutrition plan. Value goals: LDL < 70mg, 109m> 40 mg.             Education:Diabetes -  Individual verbal and written instruction to review signs/symptoms of diabetes, desired ranges of glucose level fasting, after meals and with exercise. Acknowledge that pre and post exercise glucose checks will be done for 3 sessions at entry of program.   Core Components/Risk Factors/Patient Goals Review:   Goals and Risk Factor Review     Row Name 02/19/21 0820             Core Components/Risk Factors/Patient Goals Review   Personal Goals Review Weight Management/Obesity;Hypertension       Review Yvone Neu wants to maintain his weight which he is doing. His blood pressure has been good in the program but does not check it at home. He knows where to get one and will look into it.       Expected Outcomes Short: obtain a blood pressure cuff. Long:maintain blood pressure readings  at home.                Core Components/Risk Factors/Patient Goals at Discharge (Final Review):   Goals and Risk Factor Review - 02/19/21 0820       Core Components/Risk Factors/Patient Goals Review   Personal Goals Review Weight Management/Obesity;Hypertension    Review Yvone Neu wants to maintain his weight which he is doing. His blood pressure has been good in the program but does not check it at home.  He knows where to get one and will look into it.    Expected Outcomes Short: obtain a blood pressure cuff. Long:maintain blood pressure readings  at home.             ITP Comments:  ITP Comments     Row Name 01/18/21 1122 01/20/21 1237 01/22/21 0843 02/03/21 0605 03/03/21 0629   ITP Comments Initial telephone orienation completed. Diagnosis can be found in [facility] 4/19. EP orientation scheduled for Wednesday 5/4 at 11am. Completed 6MWT and gym orientation. Initial ITP created and sent for review to Dr. Emily Filbert, Medical Director. First full day of exercise!  Patient was oriented to gym and equipment including functions, settings, policies, and procedures.  Patient's individual exercise prescription and treatment plan were reviewed.  All starting workloads were established based on the results of the 6 minute walk test done at initial orientation visit.  The plan for exercise progression was also introduced and progression will be customized based on patient's performance and goals. 30 Day review completed. Medical Director ITP review done, changes made as directed, and signed approval by Medical Director.  New to program 30 Day review completed. Medical Director ITP review done, changes made as directed, and signed approval by Medical Director.            Comments:

## 2021-03-08 ENCOUNTER — Encounter: Payer: Medicare Other | Admitting: *Deleted

## 2021-03-08 ENCOUNTER — Other Ambulatory Visit: Payer: Self-pay

## 2021-03-08 DIAGNOSIS — I214 Non-ST elevation (NSTEMI) myocardial infarction: Secondary | ICD-10-CM

## 2021-03-08 DIAGNOSIS — Z955 Presence of coronary angioplasty implant and graft: Secondary | ICD-10-CM

## 2021-03-08 NOTE — Progress Notes (Signed)
Daily Session Note  Patient Details  Name: Phillip [name] MRN: [id number] Date of Birth: 13-Jul-1946 Referring Provider:   Flowsheet Row Cardiac Rehab from 01/20/2021 in [facility] Cardiac and Pulmonary Rehab  Referring Provider [name]       Encounter Date: 03/08/2021  Check In:  Session Check In - 03/08/21 9969       Check-In   Supervising physician immediately available to respond to emergencies See telemetry face sheet for immediately available ER MD    Location [facility]-Cardiac & Pulmonary Rehab    Staff Present Heath Lark, RN, BSN, Laveda Norman, BS, ACSM CEP, Exercise Physiologist;Joseph Tessie Fass RCP,RRT,BSRT    Virtual Visit No    Medication changes reported     No    Fall or balance concerns reported    No    Warm-up and Cool-down Performed on first and last piece of equipment    Resistance Training Performed Yes    VAD Patient? No    PAD/SET Patient? No      Pain Assessment   Currently in Pain? No/denies                Social History   Tobacco Use  Smoking Status Never  Smokeless Tobacco Never    Goals Met:  Independence with exercise equipment Exercise tolerated well No report of cardiac concerns or symptoms  Goals Unmet:  Not Applicable  Comments: Pt able to follow exercise prescription today without complaint.  Will continue to monitor for progression.    Dr. Emily Filbert is Medical Director for [place].  Dr. Ottie Glazier is Medical Director for [facility] Pulmonary Rehabilitation.

## 2021-03-10 ENCOUNTER — Other Ambulatory Visit: Payer: Self-pay

## 2021-03-10 DIAGNOSIS — I214 Non-ST elevation (NSTEMI) myocardial infarction: Secondary | ICD-10-CM

## 2021-03-10 DIAGNOSIS — Z955 Presence of coronary angioplasty implant and graft: Secondary | ICD-10-CM

## 2021-03-10 NOTE — Progress Notes (Signed)
Daily Session Note  Patient Details  Name: Phillip [name] MRN: [id number] Date of Birth: 1946-08-18 Referring Provider:   Flowsheet Row Cardiac Rehab from 01/20/2021 in [facility] Cardiac and Pulmonary Rehab  Referring Provider [name]       Encounter Date: 03/10/2021  Check In:  Session Check In - 03/10/21 0802       Check-In   Supervising physician immediately available to respond to emergencies See telemetry face sheet for immediately available ER MD    Location [facility]-Cardiac & Pulmonary Rehab    Staff Present Birdie Sons, MPA, Elveria Rising, BA, ACSM CEP, Exercise Physiologist;Joseph Tessie Fass RCP,RRT,BSRT    Virtual Visit No    Medication changes reported     No    Fall or balance concerns reported    No    Warm-up and Cool-down Performed on first and last piece of equipment    Resistance Training Performed Yes    VAD Patient? No    PAD/SET Patient? No      Pain Assessment   Currently in Pain? No/denies                Social History   Tobacco Use  Smoking Status Never  Smokeless Tobacco Never    Goals Met:  Independence with exercise equipment Exercise tolerated well No report of cardiac concerns or symptoms Strength training completed today  Goals Unmet:  Not Applicable  Comments: Pt able to follow exercise prescription today without complaint.  Will continue to monitor for progression.    Dr. Emily Filbert is Medical Director for [place].  Dr. Ottie Glazier is Medical Director for [facility] Pulmonary Rehabilitation.

## 2021-03-11 ENCOUNTER — Other Ambulatory Visit: Payer: Self-pay

## 2021-03-11 DIAGNOSIS — I214 Non-ST elevation (NSTEMI) myocardial infarction: Secondary | ICD-10-CM | POA: Diagnosis not present

## 2021-03-11 DIAGNOSIS — Z955 Presence of coronary angioplasty implant and graft: Secondary | ICD-10-CM

## 2021-03-11 NOTE — Progress Notes (Signed)
Daily Session Note  Patient Details  Name: Phillip [name] MRN: [id number] Date of Birth: [birth year] Referring Provider:   Flowsheet Row Cardiac Rehab from 01/20/2021 in [facility] Cardiac and Pulmonary Rehab  Referring Provider [name]       Encounter Date: 03/11/2021  Check In:  Session Check In - 03/11/21 0750       Check-In   Supervising physician immediately available to respond to emergencies See telemetry face sheet for immediately available ER MD    Staff Present Birdie Sons, MPA, Mauricia Area, BS, ACSM CEP, Exercise Physiologist;Amanda Oletta Darter, BA, ACSM CEP, Exercise Physiologist    Virtual Visit No    Medication changes reported     No    Fall or balance concerns reported    No    Warm-up and Cool-down Performed on first and last piece of equipment    Resistance Training Performed Yes    VAD Patient? No    PAD/SET Patient? No      Pain Assessment   Currently in Pain? No/denies                Social History   Tobacco Use  Smoking Status Never  Smokeless Tobacco Never    Goals Met:  Independence with exercise equipment Exercise tolerated well No report of cardiac concerns or symptoms Strength training completed today  Goals Unmet:  Not Applicable  Comments: Pt able to follow exercise prescription today without complaint.  Will continue to monitor for progression.    Dr. Emily Filbert is Medical Director for [place].  Dr. Ottie Glazier is Medical Director for [facility] Pulmonary Rehabilitation.

## 2021-03-15 ENCOUNTER — Encounter: Payer: Medicare Other | Admitting: *Deleted

## 2021-03-15 ENCOUNTER — Other Ambulatory Visit: Payer: Self-pay

## 2021-03-15 DIAGNOSIS — I214 Non-ST elevation (NSTEMI) myocardial infarction: Secondary | ICD-10-CM

## 2021-03-15 DIAGNOSIS — Z955 Presence of coronary angioplasty implant and graft: Secondary | ICD-10-CM

## 2021-03-15 NOTE — Progress Notes (Signed)
Daily Session Note  Patient Details  Name: Phillip [name] MRN: [id number] Date of Birth: 09-02-1946 Referring Provider:   Flowsheet Row Cardiac Rehab from 01/20/2021 in [facility] Cardiac and Pulmonary Rehab  Referring Provider [name]       Encounter Date: 03/15/2021  Check In:  Session Check In - 03/15/21 0856       Check-In   Supervising physician immediately available to respond to emergencies See telemetry face sheet for immediately available ER MD    Location [facility]-Cardiac & Pulmonary Rehab    Staff Present Heath Lark, RN, BSN, Laveda Norman, BS, ACSM CEP, Exercise Physiologist;Joseph Tessie Fass RCP,RRT,BSRT    Virtual Visit No    Medication changes reported     No    Fall or balance concerns reported    No    Warm-up and Cool-down Performed on first and last piece of equipment    Resistance Training Performed Yes    VAD Patient? No    PAD/SET Patient? No      Pain Assessment   Currently in Pain? No/denies                Social History   Tobacco Use  Smoking Status Never  Smokeless Tobacco Never    Goals Met:  Independence with exercise equipment Exercise tolerated well No report of cardiac concerns or symptoms  Goals Unmet:  Not Applicable  Comments: Pt able to follow exercise prescription today without complaint.  Will continue to monitor for progression.    Dr. Emily Filbert is Medical Director for [place].  Dr. Ottie Glazier is Medical Director for [facility] Pulmonary Rehabilitation.

## 2021-03-16 ENCOUNTER — Other Ambulatory Visit: Payer: Self-pay

## 2021-03-16 DIAGNOSIS — I214 Non-ST elevation (NSTEMI) myocardial infarction: Secondary | ICD-10-CM | POA: Diagnosis not present

## 2021-03-16 DIAGNOSIS — Z955 Presence of coronary angioplasty implant and graft: Secondary | ICD-10-CM

## 2021-03-16 NOTE — Progress Notes (Signed)
Daily Session Note  Patient Details  Name: ERASMO VERTZ MRN: [id number] Date of Birth: 1946/07/14 Referring Provider:   Flowsheet Row Cardiac Rehab from 01/20/2021 in [facility] Cardiac and Pulmonary Rehab  Referring Provider [name]       Encounter Date: 03/16/2021  Check In:  Session Check In - 03/16/21 0751       Check-In   Supervising physician immediately available to respond to emergencies See telemetry face sheet for immediately available ER MD    Location [facility]-Cardiac & Pulmonary Rehab    Staff Present Birdie Sons, MPA, RN;Amanda Oletta Darter, BA, ACSM CEP, Exercise Physiologist;Kara Eliezer Bottom, MS, ASCM CEP, Exercise Physiologist    Virtual Visit No    Medication changes reported     No    Fall or balance concerns reported    No    Warm-up and Cool-down Performed on first and last piece of equipment    Resistance Training Performed Yes    VAD Patient? No    PAD/SET Patient? No      Pain Assessment   Currently in Pain? No/denies                Social History   Tobacco Use  Smoking Status Never  Smokeless Tobacco Never    Goals Met:  Independence with exercise equipment Exercise tolerated well No report of cardiac concerns or symptoms Strength training completed today  Goals Unmet:  Not Applicable  Comments: Pt able to follow exercise prescription today without complaint.  Will continue to monitor for progression.    Dr. Emily Filbert is Medical Director for [place].  Dr. Ottie Glazier is Medical Director for [facility] Pulmonary Rehabilitation.

## 2021-03-19 ENCOUNTER — Encounter: Payer: Medicare Other | Attending: Internal Medicine | Admitting: *Deleted

## 2021-03-19 ENCOUNTER — Other Ambulatory Visit: Payer: Self-pay

## 2021-03-19 DIAGNOSIS — Z955 Presence of coronary angioplasty implant and graft: Secondary | ICD-10-CM | POA: Insufficient documentation

## 2021-03-19 DIAGNOSIS — I252 Old myocardial infarction: Secondary | ICD-10-CM | POA: Diagnosis not present

## 2021-03-19 DIAGNOSIS — Z5189 Encounter for other specified aftercare: Secondary | ICD-10-CM | POA: Diagnosis not present

## 2021-03-19 DIAGNOSIS — I214 Non-ST elevation (NSTEMI) myocardial infarction: Secondary | ICD-10-CM

## 2021-03-19 NOTE — Progress Notes (Signed)
Daily Session Note  Patient Details  Name: Phillip [name] MRN: [id number] Date of Birth: 1945/12/01 Referring Provider:   Flowsheet Row Cardiac Rehab from 01/20/2021 in [facility] Cardiac and Pulmonary Rehab  Referring Provider [name]       Encounter Date: 03/19/2021  Check In:  Session Check In - 03/19/21 7989       Check-In   Supervising physician immediately available to respond to emergencies See telemetry face sheet for immediately available ER MD    Location [facility]-Cardiac & Pulmonary Rehab    Staff Present Heath Lark, RN, BSN, CCRP;Joseph [place], RCP,RRT,BSRT;Jessica [place], [place], [place], CCRP, CCET    Virtual Visit No    Medication changes reported     No    Fall or balance concerns reported    No    Warm-up and Cool-down Performed on first and last piece of equipment    Resistance Training Performed Yes    VAD Patient? No    PAD/SET Patient? No      Pain Assessment   Currently in Pain? No/denies                Social History   Tobacco Use  Smoking Status Never  Smokeless Tobacco Never    Goals Met:  Independence with exercise equipment Exercise tolerated well No report of cardiac concerns or symptoms  Goals Unmet:  Not Applicable  Comments: Pt able to follow exercise prescription today without complaint.  Will continue to monitor for progression.    Dr. Emily Filbert is Medical Director for [place].  Dr. Ottie Glazier is Medical Director for [facility] Pulmonary Rehabilitation.

## 2021-03-24 ENCOUNTER — Other Ambulatory Visit: Payer: Self-pay

## 2021-03-24 DIAGNOSIS — Z5189 Encounter for other specified aftercare: Secondary | ICD-10-CM | POA: Diagnosis not present

## 2021-03-24 DIAGNOSIS — Z955 Presence of coronary angioplasty implant and graft: Secondary | ICD-10-CM

## 2021-03-24 DIAGNOSIS — I214 Non-ST elevation (NSTEMI) myocardial infarction: Secondary | ICD-10-CM

## 2021-03-24 NOTE — Progress Notes (Signed)
Daily Session Note  Patient Details  Name: Phillip [name] MRN: [id number] Date of Birth: July 28, 1946 Referring Provider:   Flowsheet Row Cardiac Rehab from 01/20/2021 in [facility] Cardiac and Pulmonary Rehab  Referring Provider [name]       Encounter Date: 03/24/2021  Check In:  Session Check In - 03/24/21 0758       Check-In   Supervising physician immediately available to respond to emergencies See telemetry face sheet for immediately available ER MD    Location [facility]-Cardiac & Pulmonary Rehab    Staff Present Birdie Sons, MPA, RN;Laureen Owens Shark, BS, RRT, CPFT;Joseph Tessie Fass, [place]    Virtual Visit No    Medication changes reported     No    Fall or balance concerns reported    No    Warm-up and Cool-down Performed on first and last piece of equipment    Resistance Training Performed Yes    VAD Patient? No    PAD/SET Patient? No      Pain Assessment   Currently in Pain? No/denies                Social History   Tobacco Use  Smoking Status Never  Smokeless Tobacco Never    Goals Met:  Independence with exercise equipment Exercise tolerated well No report of cardiac concerns or symptoms Strength training completed today  Goals Unmet:  Not Applicable  Comments: Pt able to follow exercise prescription today without complaint.  Will continue to monitor for progression.    Dr. Emily Filbert is Medical Director for [place].  Dr. Ottie Glazier is Medical Director for [facility] Pulmonary Rehabilitation.

## 2021-03-25 DIAGNOSIS — E1122 Type 2 diabetes mellitus with diabetic chronic kidney disease: Secondary | ICD-10-CM | POA: Insufficient documentation

## 2021-03-25 DIAGNOSIS — E785 Hyperlipidemia, unspecified: Secondary | ICD-10-CM | POA: Insufficient documentation

## 2021-03-25 DIAGNOSIS — R0789 Other chest pain: Secondary | ICD-10-CM | POA: Diagnosis present

## 2021-03-25 DIAGNOSIS — Z79899 Other long term (current) drug therapy: Secondary | ICD-10-CM | POA: Diagnosis not present

## 2021-03-25 DIAGNOSIS — Z7901 Long term (current) use of anticoagulants: Secondary | ICD-10-CM | POA: Diagnosis not present

## 2021-03-25 DIAGNOSIS — E039 Hypothyroidism, unspecified: Secondary | ICD-10-CM | POA: Diagnosis not present

## 2021-03-25 DIAGNOSIS — Z7902 Long term (current) use of antithrombotics/antiplatelets: Secondary | ICD-10-CM | POA: Insufficient documentation

## 2021-03-25 DIAGNOSIS — Z7982 Long term (current) use of aspirin: Secondary | ICD-10-CM | POA: Diagnosis not present

## 2021-03-25 DIAGNOSIS — Z85850 Personal history of malignant neoplasm of thyroid: Secondary | ICD-10-CM | POA: Insufficient documentation

## 2021-03-25 DIAGNOSIS — N183 Chronic kidney disease, stage 3 unspecified: Secondary | ICD-10-CM | POA: Diagnosis not present

## 2021-03-25 DIAGNOSIS — I2510 Atherosclerotic heart disease of native coronary artery without angina pectoris: Secondary | ICD-10-CM | POA: Insufficient documentation

## 2021-03-25 DIAGNOSIS — Z8546 Personal history of malignant neoplasm of prostate: Secondary | ICD-10-CM | POA: Diagnosis not present

## 2021-03-25 DIAGNOSIS — E1169 Type 2 diabetes mellitus with other specified complication: Secondary | ICD-10-CM | POA: Diagnosis not present

## 2021-03-25 DIAGNOSIS — I129 Hypertensive chronic kidney disease with stage 1 through stage 4 chronic kidney disease, or unspecified chronic kidney disease: Secondary | ICD-10-CM | POA: Diagnosis not present

## 2021-03-25 DIAGNOSIS — Z85828 Personal history of other malignant neoplasm of skin: Secondary | ICD-10-CM | POA: Diagnosis not present

## 2021-03-26 ENCOUNTER — Emergency Department: Payer: Medicare Other

## 2021-03-26 ENCOUNTER — Emergency Department
Admission: EM | Admit: 2021-03-26 | Discharge: 2021-03-26 | Disposition: A | Discharge status: Home / Self Care | Payer: Medicare Other | Attending: Emergency Medicine | Admitting: Emergency Medicine

## 2021-03-26 ENCOUNTER — Encounter: Payer: Self-pay | Admitting: Emergency Medicine

## 2021-03-26 ENCOUNTER — Other Ambulatory Visit: Payer: Self-pay

## 2021-03-26 DIAGNOSIS — R079 Chest pain, unspecified: Secondary | ICD-10-CM

## 2021-03-26 LAB — BASIC METABOLIC PANEL
Anion gap: 4 — ABNORMAL LOW (ref 5–15)
BUN: 21 mg/dL (ref 8–23)
CO2: 25 mmol/L (ref 22–32)
Calcium: 8.6 mg/dL — ABNORMAL LOW (ref 8.9–10.3)
Chloride: 109 mmol/L (ref 98–111)
Creatinine, Ser: 1.24 mg/dL (ref 0.61–1.24)
GFR, Estimated: >60 mL/min (ref >60)
Glucose, Bld: 119 mg/dL — ABNORMAL HIGH (ref 70–99)
Potassium: 4.3 mmol/L (ref 3.5–5.1)
Sodium: 138 mmol/L (ref 135–145)

## 2021-03-26 LAB — CBC
HCT: 46.9 % (ref 39.0–52.0)
Hemoglobin: 14.9 g/dL (ref 13.0–17.0)
MCH: 29.2 pg (ref 26.0–34.0)
MCHC: 31.8 g/dL (ref 30.0–36.0)
MCV: 92 fL (ref 80.0–100.0)
Platelets: 446 10*3/uL — ABNORMAL HIGH (ref 150–400)
RBC: 5.1 MIL/uL (ref 4.22–5.81)
RDW: 18 % — ABNORMAL HIGH (ref 11.5–15.5)
WBC: 9.2 10*3/uL (ref 4.0–10.5)
nRBC: 0 % (ref 0.0–0.2)

## 2021-03-26 LAB — PROTIME-INR
INR: 2.1 — ABNORMAL HIGH (ref 0.8–1.2)
Prothrombin Time: 23.3 seconds — ABNORMAL HIGH (ref 11.4–15.2)

## 2021-03-26 LAB — TROPONIN I (HIGH SENSITIVITY)
Troponin I (High Sensitivity): 9 ng/L (ref <18)
Troponin I (High Sensitivity): 9 ng/L (ref <18)

## 2021-03-26 MED ORDER — IOHEXOL 350 MG/ML SOLN
75.0000 mL | Freq: Once | INTRAVENOUS | Status: AC | PRN
  Administered 2021-03-26: 75 mL via INTRAVENOUS

## 2021-03-26 NOTE — Discharge Instructions (Addendum)
Your cardiac labs were reassuring today.  You had 2 normal troponins and an EKG that showed no new changes.  CT scan showed no blood clot, pneumonia, pulmonary edema, rib fracture, collapsed lung or other abnormality.  If you continue to have pain, recommend close follow-up with your primary care physician.  If your pain changes any began having tightness, pressure, shortness of breath, sudden sweating, feel like you are going to pass out, fever or cough, please return to the emergency department.  You may take Tylenol 1000 mg every 6 hours as needed for pain.  This medication is found over-the-counter.

## 2021-03-26 NOTE — ED Provider Notes (Signed)
[facility] Emergency Department Provider Note  ____________________________________________   Event Date/Time   First MD Initiated Contact with Patient 03/26/21 0230     (approximate)  I have reviewed the triage vital signs and the nursing notes.   HISTORY  Chief Complaint Chest Pain    HPI Phillip [name] is a 75 y.o. 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  Reports compliance with his anticoagulation.  Denies associated fevers, cough, shortness of breath, nausea, vomiting, dizziness, diaphoresis, lower extremity swelling or pain.  States this feels different than his NSTEMI that he had in April 2022.  States that was a tightness, pressure.        Past Medical History:  Diagnosis Date   Arthritis    Basal cell carcinoma    L clavicle, L prox forearm- removed years ago    Bladder cancer (HCC)    Bladder neck contracture    CAD (coronary artery disease)    a. 12/2020 NSTEMI/Cath: LM nl, LAD 95ost/p, LCX 50p, RCA nl. EF 45-50%.   Cataract    CKD (chronic kidney disease), stage III [facility])    ED (erectile dysfunction)    Frequency    GERD (gastroesophageal reflux disease)    History of pulmonary embolus (PE) 11/2018   a. Following LE DVT-->Chronic warfarin.   Hyperlipidemia LDL goal <70    Hypertension    Hypothyroidism    Incontinence of urine    sui, s/p cryoablation   Ischemic cardiomyopathy    a. 11/2018 Echo: EF 60-65%; b. 12/2020 LV Gram: EF 45-50% in setting of NSTEMI.   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/20/2020   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 pylori) infection 09/04/2017   Medicare annual wellness visit, initial 03/31/2017   History of prostate cancer 10/27/2014   Lower urinary tract symptoms (LUTS) 03/18/2012   Nephrolithiasis 03/18/2012    Past Surgical History:  Procedure Laterality Date   ARTHROPLASTY AND TENDON REPAIR LEFT THUMB  JAN 2014   CATARACT EXTRACTION W/PHACO Left 11/16/2020   Procedure: CATARACT EXTRACTION PHACO AND INTRAOCULAR LENS PLACEMENT (IOC) LEFT  7.09 00:56.4 12.6%;  Surgeon: Leandrew Koyanagi, MD;  Location: [place];  Service: Ophthalmology;  Laterality: Left;   CATARACT EXTRACTION W/PHACO Right 12/02/2020   Procedure: CATARACT EXTRACTION PHACO AND INTRAOCULAR LENS PLACEMENT (IOC) RIGHT MALYUGIN 5.52 00:49.7 11.1%;  Surgeon: Leandrew Koyanagi, MD;  Location: [place];  Service: Ophthalmology;  Laterality: Right;   COLONOSCOPY     COLONOSCOPY WITH PROPOFOL N/A 08/09/2017   Procedure: COLONOSCOPY WITH PROPOFOL;  Surgeon: Manya Silvas, MD;  Location: [facility] ENDOSCOPY;  Service:  Endoscopy;  Laterality: N/A;   CORONARY ANGIOGRAPHY N/A 12/28/2020   Procedure: CORONARY ANGIOGRAPHY;  Surgeon: Sherren Mocha, MD;  Location: [place] CV LAB;  Service: Cardiovascular;  Laterality: N/A;   CORONARY STENT INTERVENTION N/A 12/28/2020   Procedure: CORONARY STENT INTERVENTION;  Surgeon: Sherren Mocha, MD;  Location: [place] CV LAB;  Service: Cardiovascular;  Laterality: N/A;   ESOPHAGOGASTRODUODENOSCOPY (EGD) WITH PROPOFOL N/A 08/09/2017   Procedure: ESOPHAGOGASTRODUODENOSCOPY (EGD) WITH PROPOFOL;  Surgeon: Manya Silvas, MD;  Location: [facility] ENDOSCOPY;  Service: Endoscopy;  Laterality: N/A;   GREEN LIGHT LASER TURP (TRANSURETHRAL RESECTION OF PROSTATE N/A 12/14/2015   Procedure: GREEN LIGHT LASER TURP (TRANSURETHRAL RESECTION OF PROSTATE) LASER OF BLADDER NECK CONTRACTURE;  Surgeon: Carolan Clines, MD;  Location: [place];  Service: Urology;  Laterality: N/A;   LEFT HEART CATH AND CORONARY ANGIOGRAPHY N/A 12/24/2020   Procedure: LEFT HEART CATH AND CORONARY ANGIOGRAPHY and possible PCI and stent;  Surgeon: Yolonda Kida, MD;  Location: [place] CV LAB;  Service: Cardiovascular;  Laterality: N/A;   LEFT HEART CATH AND CORONARY ANGIOGRAPHY N/A 01/06/2021   Procedure: LEFT HEART CATH AND CORONARY ANGIOGRAPHY;  Surgeon: Troy Sine, MD;  Location: [place] CV LAB;  Service: Cardiovascular;  Laterality: N/A;   PENILE PROSTHESIS IMPLANT N/A 12/06/2013   Procedure: PENILE PROTHESIS INFLATABLE;  Surgeon: Ailene Rud, MD;  Location: [facility];  Service: Urology;  Laterality: N/A;   PROSTATE CRYOABLATION  06-01-2012  [facility]   TRANSURETHRAL RESECTION OF BLADDER NECK N/A 03/20/2015   Procedure: RELEASE BLADDER NECK CONTRACTURE  WITH WOLF BUTTON ELECTRODE ;  Surgeon: Carolan Clines, MD;  Location: [place];  Service: Urology;  Laterality: N/A;   TRANSURETHRAL RESECTION OF BLADDER TUMOR N/A 12/05/2018    Procedure: TRANSURETHRAL RESECTION OF BLADDER TUMOR (TURBT)/CYSTOSCOPY/  INSTILLATION OF Rodell Perna;  Surgeon: Ceasar Mons, MD;  Location: [facility];  Service: Urology;  Laterality: N/A;   TRANSURETHRAL RESECTION OF BLADDER TUMOR N/A 01/15/2020   Procedure: TRANSURETHRAL RESECTION OF BLADDER TUMOR (TURBT)/ CYSTOSCOPY;  Surgeon: Ceasar Mons, MD;  Location: [facility];  Service: Urology;  Laterality: N/A;   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      Prior to Admission medications   Medication Sig Start Date End Date Taking? Authorizing Provider  acetaminophen (TYLENOL) 500 MG tablet Take 500 mg by mouth every 6 (six) hours as needed.    [provider]  amLODipine (NORVASC) 5 MG tablet Take 5 mg by mouth daily.    [provider]  aspirin 81 MG EC tablet Take 1 tablet (81 mg total) by mouth daily. Swallow whole. 12/29/20   Kroeger, Lorelee Cover., PA-C  atorvastatin (LIPITOR) 80 MG tablet Take 1 tablet (80 mg total) by mouth daily. 12/29/20   Kroeger, Lorelee Cover., PA-C  cholecalciferol (VITAMIN D3) 25 MCG (1000 UT) tablet Take 1,000 Units by mouth daily. Patient not taking: Reported on 01/18/2021  [provider]  Chromium 1 MG CAPS Take 1 mg by mouth daily. 1 per day    [provider]  clopidogrel (PLAVIX) 75 MG tablet Take 1 tablet (75 mg total) by mouth daily with breakfast. 12/29/20   Kroeger, Lorelee Cover., PA-C  enoxaparin (LOVENOX) 80 MG/0.8ML injection Inject 0.8 mLs (80 mg total) into the skin every 12 (twelve) hours. Patient not taking: Reported on 01/18/2021 01/07/21   Ledora Bottcher, PA  Glucosamine-Chondroit-Vit C-Mn (GLUCOSAMINE CHONDR 1500 COMPLX PO) Take 1 tablet by mouth every evening.    [provider]  isosorbide mononitrate (IMDUR) 30 MG 24 hr tablet Take 1 tablet (30 mg total) by mouth daily. 01/08/21   Ledora Bottcher, PA  levothyroxine (SYNTHROID) 50  MCG tablet Take 50 mcg by mouth. Patient not taking: Reported on 01/18/2021 04/20/20 04/20/21  [provider]  loratadine (CLARITIN) 10 MG tablet Take 10 mg by mouth in the morning and at bedtime.    [provider]  metoprolol tartrate (LOPRESSOR) 25 MG tablet Take 0.5 tablets (12.5 mg total) by mouth 2 (two) times daily. 12/25/20   Fritzi Mandes, MD  Multiple Vitamin (MULTIVITAMIN) tablet Take 1 tablet by mouth daily.    [provider]  niacin 500 MG tablet Take 500 mg by mouth at bedtime. Patient not taking: Reported on 01/18/2021    [provider]  nitroGLYCERIN (NITROSTAT) 0.4 MG SL tablet Place 1 tablet (0.4 mg total) under the tongue every 5 (five) minutes x 3 doses as needed for chest pain. 12/29/20   Kroeger, Lorelee Cover., PA-C  pantoprazole (PROTONIX) 40 MG tablet Take 1 tablet (40 mg total) by mouth daily. 12/29/20   Kroeger, Lorelee Cover., PA-C  potassium phosphate, monobasic, (K-PHOS ORIGINAL) 500 MG tablet Take 500 mg by mouth daily.    [provider]  TURMERIC PO Take 1 tablet by mouth daily.    [provider]  warfarin (COUMADIN) 4 MG tablet Take 0.5-1 tablets (2-4 mg total) by mouth See admin instructions. Take 4mg  daily by mouth with the exception of 2mg  daily by mouth on Mondays and Thursdays. Take 4 mg 01/07/21, then resume regular schedule. 01/07/21   [facility], Tami Lin, PA    Allergies Doxazosin  Family History  Problem Relation Age of Onset   CAD Father 22    Social History Social History   Tobacco Use   Smoking status: Never   Smokeless tobacco: Never  Vaping Use   Vaping Use: Never used  Substance Use Topics   Alcohol use: No   Drug use: No    Review of Systems Constitutional: No fever. Eyes: No visual changes. ENT: No sore throat. Cardiovascular: + chest pain. Respiratory: Denies shortness of breath. Gastrointestinal: No nausea, vomiting, diarrhea. Genitourinary: Negative for dysuria. Musculoskeletal: Negative  for back pain. Skin: Negative for rash. Neurological: Negative for focal weakness or numbness.  ____________________________________________   PHYSICAL EXAM:  VITAL SIGNS: ED Triage Vitals [03/26/21 0006]  Enc Vitals Group     BP 124/85     Pulse Rate (!) 55     Resp 17     Temp 98 F (36.7 C)     Temp Source Oral     SpO2 98 %     Weight      Height      Head Circumference      Peak Flow      Pain Score      Pain Loc  Pain Edu?      Excl. in [place]?    CONSTITUTIONAL: Alert and oriented and responds appropriately to questions. Well-appearing; well-nourished HEAD: Normocephalic EYES: Conjunctivae clear, pupils appear equal, EOM appear intact ENT: normal nose; moist mucous membranes NECK: Supple, normal ROM CARD: RRR; S1 and S2 appreciated; no murmurs, no clicks, no rubs, no gallops CHEST:  Chest wall is tender to palpation over the right anterior chest wall which reproduces his pain.  No crepitus, ecchymosis, erythema, warmth, rash or other lesions present.   RESP: Normal chest excursion without splinting or tachypnea; breath sounds clear and equal bilaterally; no wheezes, no rhonchi, no rales, no hypoxia or respiratory distress, speaking full sentences ABD/GI: Normal bowel sounds; non-distended; soft, non-tender, no rebound, no guarding, no peritoneal signs, no hepatosplenomegaly BACK: The back appears normal EXT: Normal ROM in all joints; no deformity noted, no edema; no cyanosis, no calf tenderness or calf swelling SKIN: Normal color for age and race; warm; no rash on exposed skin NEURO: Moves all extremities equally PSYCH: The patient's mood and manner are appropriate.  ____________________________________________   LABS (all labs ordered are listed, but only abnormal results are displayed)  Labs Reviewed  BASIC METABOLIC PANEL - Abnormal; Notable for the following components:      Result Value   Glucose, Bld 119 (*)    Calcium 8.6 (*)    Anion gap 4 (*)     All other components within normal limits  CBC - Abnormal; Notable for the following components:   RDW 18.0 (*)    Platelets 446 (*)    All other components within normal limits  PROTIME-INR - Abnormal; Notable for the following components:   Prothrombin Time 23.3 (*)    INR 2.1 (*)    All other components within normal limits  TROPONIN I (HIGH SENSITIVITY)  TROPONIN I (HIGH SENSITIVITY)   ____________________________________________  EKG   Date: 03/26/2021 00:38  Rate: 56  Rhythm: Sinus bradycardia  QRS Axis: normal  Intervals: normal  ST/T Wave abnormalities: normal  Conduction Disutrbances: First-degree AV block  Narrative Interpretation: Sinus bradycardia, first-degree AV block      ____________________________________________  RADIOLOGY I, Rilei Kravitz, personally viewed and evaluated these images (plain radiographs) as part of my medical decision making, as well as reviewing the written report by the radiologist.  ED MD interpretation: Chest x-ray clear.  CTA chest negative for PE.  No DVT seen on Doppler.  Official radiology report(s): DG Chest 2 View  Result Date: 03/26/2021 CLINICAL DATA:  Right-sided chest pain since this morning. EXAM: CHEST - 2 VIEW COMPARISON:  01/05/2021 FINDINGS: The heart size and mediastinal contours are within normal limits. Both lungs are clear. The visualized skeletal structures are unremarkable. IMPRESSION: No active cardiopulmonary disease. Electronically Signed   By: Lucienne Capers M.D.   On: 03/26/2021 00:40   CT Angio Chest PE W and/or Wo Contrast  Result Date: 03/26/2021 CLINICAL DATA:  Continuous nonradiating chest pain since this morning. History of DVT and PE, on warfarin EXAM: CT ANGIOGRAPHY CHEST WITH CONTRAST TECHNIQUE: Multidetector CT imaging of the chest was performed using the standard protocol during bolus administration of intravenous contrast. Multiplanar CT image reconstructions and MIPs were obtained to evaluate the  vascular anatomy. CONTRAST:  63mL OMNIPAQUE IOHEXOL 350 MG/ML SOLN COMPARISON:  12/23/2020 FINDINGS: Cardiovascular: Satisfactory opacification of the pulmonary arteries to the segmental level. No evidence of pulmonary embolism. Borderline heart size. No pericardial effusion. Extensive atheromatous plaque affecting the coronary arteries. Mediastinum/Nodes: Negative for  adenopathy or mass Lungs/Pleura: There is no edema, consolidation, effusion, or pneumothorax. Upper Abdomen: 16 mm low-density nodule in the lateral spleen with only mild growth since a 2020 comparison. Left renal cyst which is partially covered. Musculoskeletal: No acute finding. Stable benign sclerosis at the T4 body. Review of the MIP images confirms the above findings. IMPRESSION: 1. Negative for pulmonary embolism or other acute finding. 2. Extensive coronary calcification. Electronically Signed   By: Monte Fantasia M.D.   On: 03/26/2021 05:46   US Venous Img Lower Bilateral  Result Date: 03/26/2021 CLINICAL DATA:  Chest pain with history of pulmonary embolism and DVT EXAM: BILATERAL LOWER EXTREMITY VENOUS DOPPLER ULTRASOUND TECHNIQUE: Gray-scale sonography with compression, as well as color and duplex ultrasound, were performed to evaluate the deep venous system(s) from the level of the common femoral vein through the popliteal and proximal calf veins. COMPARISON:  11/25/2018 FINDINGS: VENOUS Bilaterally normal compressibility of the common femoral, superficial femoral, and popliteal veins, as well as the visualized calf veins. Visualized portions of profunda femoral vein and great saphenous vein unremarkable. No filling defects to suggest DVT on grayscale or color Doppler imaging. Doppler waveforms show normal direction of venous flow, normal respiratory plasticity and response to augmentation. Limited views of the contralateral common femoral vein are unremarkable. IMPRESSION: Negative for DVT in the lower extremities. Electronically  Signed   By: Monte Fantasia M.D.   On: 03/26/2021 05:34    ____________________________________________   PROCEDURES  Procedure(s) performed (including Critical Care):  Procedures   ____________________________________________   INITIAL IMPRESSION / ASSESSMENT AND PLAN / ED COURSE  As part of my medical decision making, I reviewed the following data within the [place] notes reviewed and incorporated, Labs reviewed , EKG interpreted , Old EKG reviewed, Old chart reviewed, Radiograph reviewed , and Notes from prior ED visits         Patient here with complaints of chest pain.  Seems atypical for ACS but he does have a significant cardiac history.  EKG nonischemic.  First troponin negative.  Second pending.  Chest x-ray clear showing no signs of volume overload, infiltrate, pneumothorax.  Will obtain CT of the chest to rule out PE.  His INR is therapeutic today.  We will also obtain Dopplers of his bilateral lower extremities as he has had DVT in both legs previously.  He declines any pain medication at this time.  We did discuss that pain could be musculoskeletal in nature given it is worse with movement, palpation.  He denies any injury to his chest, increased exertion or lifting anything heavy recently.  Doubt dissection.  ED PROGRESS  Second troponin is normal, flat.  Bilateral lower extremity Dopplers negative.  CTA shows no acute abnormality.  Discussed with patient that I suspect this is musculoskeletal pain and that he may take Tylenol 1000 mg every 6 hours as needed for pain.  He is comfortable with this plan and ready for discharge home.  Discussed return precautions.  He has outpatient follow-up.  At this time, I do not feel there is any life-threatening condition present. I have reviewed, interpreted and discussed all results (EKG, imaging, lab, urine as appropriate) and exam findings with patient/family. I have reviewed nursing notes and appropriate  previous records.  I feel the patient is safe to be discharged home without further emergent workup and can continue workup as an outpatient as needed. Discussed usual and customary return precautions. Patient/family verbalize understanding and are comfortable with this  plan.  Outpatient follow-up has been provided as needed. All questions have been answered.  ____________________________________________   FINAL CLINICAL IMPRESSION(S) / ED DIAGNOSES  Final diagnoses:  Right-sided chest pain     ED Discharge Orders     None       *Please note:  Phillip [name] was evaluated in Emergency Department on 03/26/2021 for the symptoms described in the history of present illness. He was evaluated in the context of the global COVID-19 pandemic, which necessitated consideration that the patient might be at risk for infection with the SARS-CoV-2 virus that causes COVID-19. Institutional protocols and algorithms that pertain to the evaluation of patients at risk for COVID-19 are in a state of rapid change based on information released by regulatory bodies including the CDC and federal and state organizations. These policies and algorithms were followed during the patient's care in the ED.  Some ED evaluations and interventions may be delayed as a result of limited staffing during and the pandemic.*   Note:  This document was prepared using Dragon voice recognition software and may include unintentional dictation errors.    Nastasha Reising, Delice Bison, DO 03/26/21 [phone]

## 2021-03-26 NOTE — ED Triage Notes (Signed)
Pt c/o right sided continuous, non-radiating chest pain since this AM. Pt denies SOB, N/V. Pt had widow maker in April with stent placement by [facility]. Pt on Warfarin. Pt with hx/o PE's.

## 2021-03-29 ENCOUNTER — Encounter: Payer: Medicare Other | Admitting: *Deleted

## 2021-03-29 ENCOUNTER — Other Ambulatory Visit: Payer: Self-pay

## 2021-03-29 DIAGNOSIS — Z5189 Encounter for other specified aftercare: Secondary | ICD-10-CM | POA: Diagnosis not present

## 2021-03-29 DIAGNOSIS — Z955 Presence of coronary angioplasty implant and graft: Secondary | ICD-10-CM

## 2021-03-29 DIAGNOSIS — I214 Non-ST elevation (NSTEMI) myocardial infarction: Secondary | ICD-10-CM

## 2021-03-29 NOTE — Progress Notes (Signed)
Daily Session Note  Patient Details  Name: Phillip [name] MRN: [id number] Date of Birth: [birth year] Referring Provider:   Flowsheet Row Cardiac Rehab from 01/20/2021 in [facility] Cardiac and Pulmonary Rehab  Referring Provider [name]       Encounter Date: 03/29/2021  Check In:  Session Check In - 03/29/21 0841       Check-In   Supervising physician immediately available to respond to emergencies See telemetry face sheet for immediately available ER MD    Location [facility]-Cardiac & Pulmonary Rehab    Staff Present Heath Lark, RN, BSN, CCRP;Kristen Coble, RN,BC,MSN;Kelly Montesano, [place], ACSM CEP, Exercise Physiologist    Virtual Visit No    Medication changes reported     No    Fall or balance concerns reported    No    Warm-up and Cool-down Performed on first and last piece of equipment    VAD Patient? No    PAD/SET Patient? No      Pain Assessment   Currently in Pain? No/denies                Social History   Tobacco Use  Smoking Status Never  Smokeless Tobacco Never    Goals Met:  Independence with exercise equipment Exercise tolerated well No report of cardiac concerns or symptoms  Goals Unmet:  Not Applicable  Comments: Pt able to follow exercise prescription today without complaint.  Will continue to monitor for progression.    Dr. Emily Filbert is Medical Director for [place].  Dr. Ottie Glazier is Medical Director for [facility] Pulmonary Rehabilitation.

## 2021-03-31 ENCOUNTER — Other Ambulatory Visit: Payer: Self-pay

## 2021-03-31 ENCOUNTER — Encounter: Payer: Medicare Other | Admitting: *Deleted

## 2021-03-31 ENCOUNTER — Encounter: Payer: Self-pay | Admitting: *Deleted

## 2021-03-31 DIAGNOSIS — I214 Non-ST elevation (NSTEMI) myocardial infarction: Secondary | ICD-10-CM

## 2021-03-31 DIAGNOSIS — Z5189 Encounter for other specified aftercare: Secondary | ICD-10-CM | POA: Diagnosis not present

## 2021-03-31 DIAGNOSIS — Z955 Presence of coronary angioplasty implant and graft: Secondary | ICD-10-CM

## 2021-03-31 NOTE — Progress Notes (Signed)
Cardiac Individual Treatment Plan  Patient Details  Name: Phillip [name] MRN: [id number] Date of Birth: 1945/12/27 Referring Provider:   Flowsheet Row Cardiac Rehab from 01/20/2021 in [facility] Cardiac and Pulmonary Rehab  Referring Provider [name]       Initial Encounter Date:  Flowsheet Row Cardiac Rehab from 01/20/2021 in [facility] Cardiac and Pulmonary Rehab  Date 01/20/21       Visit Diagnosis: NSTEMI (non-ST elevated myocardial infarction) [facility])  Status post coronary artery stent placement  Patient's Home Medications on Admission:  Current Outpatient Medications:    acetaminophen (TYLENOL) 500 MG tablet, Take 500 mg by mouth every 6 (six) hours as needed., Disp: , Rfl:    amLODipine (NORVASC) 5 MG tablet, Take 5 mg by mouth daily., Disp: , Rfl:    aspirin 81 MG EC tablet, Take 1 tablet (81 mg total) by mouth daily. Swallow whole., Disp: 30 tablet, Rfl: 0   atorvastatin (LIPITOR) 80 MG tablet, Take 1 tablet (80 mg total) by mouth daily., Disp: 90 tablet, Rfl: 3   cholecalciferol (VITAMIN D3) 25 MCG (1000 UT) tablet, Take 1,000 Units by mouth daily. (Patient not taking: Reported on 01/18/2021), Disp: , Rfl:    Chromium 1 MG CAPS, Take 1 mg by mouth daily. 1 per day, Disp: , Rfl:    clopidogrel (PLAVIX) 75 MG tablet, Take 1 tablet (75 mg total) by mouth daily with breakfast., Disp: 90 tablet, Rfl: 3   enoxaparin (LOVENOX) 80 MG/0.8ML injection, Inject 0.8 mLs (80 mg total) into the skin every 12 (twelve) hours. (Patient not taking: Reported on 01/18/2021), Disp: 3.2 mL, Rfl: 0   Glucosamine-Chondroit-Vit C-Mn (GLUCOSAMINE CHONDR 1500 COMPLX PO), Take 1 tablet by mouth every evening., Disp: , Rfl:    isosorbide mononitrate (IMDUR) 30 MG 24 hr tablet, Take 1 tablet (30 mg total) by mouth daily., Disp: 90 tablet, Rfl: 3   levothyroxine (SYNTHROID) 50 MCG tablet, Take 50 mcg by mouth. (Patient not taking: Reported on 01/18/2021), Disp: , Rfl:    loratadine (CLARITIN) 10 MG tablet, Take 10  mg by mouth in the morning and at bedtime., Disp: , Rfl:    metoprolol tartrate (LOPRESSOR) 25 MG tablet, Take 0.5 tablets (12.5 mg total) by mouth 2 (two) times daily., Disp: 60 tablet, Rfl: 0   Multiple Vitamin (MULTIVITAMIN) tablet, Take 1 tablet by mouth daily., Disp: , Rfl:    niacin 500 MG tablet, Take 500 mg by mouth at bedtime. (Patient not taking: Reported on 01/18/2021), Disp: , Rfl:    nitroGLYCERIN (NITROSTAT) 0.4 MG SL tablet, Place 1 tablet (0.4 mg total) under the tongue every 5 (five) minutes x 3 doses as needed for chest pain., Disp: 25 tablet, Rfl: 3   pantoprazole (PROTONIX) 40 MG tablet, Take 1 tablet (40 mg total) by mouth daily., Disp: 90 tablet, Rfl: 3   potassium phosphate, monobasic, (K-PHOS ORIGINAL) 500 MG tablet, Take 500 mg by mouth daily., Disp: , Rfl:    TURMERIC PO, Take 1 tablet by mouth daily., Disp: , Rfl:    warfarin (COUMADIN) 4 MG tablet, Take 0.5-1 tablets (2-4 mg total) by mouth See admin instructions. Take 72m daily by mouth with the exception of 221mdaily by mouth on Mondays and Thursdays. Take 4 mg 01/07/21, then resume regular schedule., Disp: , Rfl:   Past Medical History: Past Medical History:  Diagnosis Date   Arthritis    Basal cell carcinoma    L clavicle, L prox forearm- removed years ago  Bladder cancer [facility])    Bladder neck contracture    CAD (coronary artery disease)    a. 12/2020 NSTEMI/Cath: LM nl, LAD 95ost/p, LCX 50p, RCA nl. EF 45-50%.   Cataract    CKD (chronic kidney disease), stage III [facility])    ED (erectile dysfunction)    Frequency    GERD (gastroesophageal reflux disease)    History of pulmonary embolus (PE) 11/2018   a. Following LE DVT-->Chronic warfarin.   Hyperlipidemia LDL goal <70    Hypertension    Hypothyroidism    Incontinence of urine    sui, s/p cryoablation   Ischemic cardiomyopathy    a. 11/2018 Echo: EF 60-65%; b. 12/2020 LV Gram: EF 45-50% in setting of NSTEMI.   Kidney stones    Neuropathy    feet    Nocturia    Prostate cancer ([place])    S/P   CRYOABLATION   Right elbow tendinitis    Right Lower Extremity DVT (deep venous thrombosis) (HCC) 11/25/2018   Vertigo    1-2x/yr   Wears glasses     Tobacco Use: Social History   Tobacco Use  Smoking Status Never  Smokeless Tobacco Never    Labs: Recent Review Flowsheet Data     Labs for ITP Cardiac and Pulmonary Rehab Latest Ref Rng & Units 12/06/2013 12/14/2015 01/15/2020 12/24/2020   Cholestrol 0 - 200 mg/dL - - - 174   LDLCALC 0 - 99 mg/dL - - - 119(H)   HDL >40 mg/dL - - - 32(L)   Trlycerides <150 mg/dL - - - 113   TCO2 22 - 32 mmol/L _0 -        Exercise Target Goals: Exercise Program Goal: Individual exercise prescription set using results from initial 6 min walk test and THRR while considering  patient's activity barriers and safety.   Exercise Prescription Goal: Initial exercise prescription builds to 30-45 minutes a day of aerobic activity, 2-3 days per week.  Home exercise guidelines will be given to patient during program as part of exercise prescription that the participant will acknowledge.   Education: Aerobic Exercise: - Group verbal and visual presentation on the components of exercise prescription. Introduces F.I.T.T principle from ACSM for exercise prescriptions.  Reviews F.I.T.T. principles of aerobic exercise including progression. Written material given at graduation. Flowsheet Row Cardiac Rehab from 03/24/2021 in [facility] Cardiac and Pulmonary Rehab  Date 03/10/21  Educator [facility]  Instruction Review Code 1- Verbalizes Understanding       Education: Resistance Exercise: - Group verbal and visual presentation on the components of exercise prescription. Introduces F.I.T.T principle from ACSM for exercise prescriptions  Reviews F.I.T.T. principles of resistance exercise including progression. Written material given at graduation.    Education: Exercise & Equipment Safety: - Individual verbal instruction and  demonstration of equipment use and safety with use of the equipment. Flowsheet Row Cardiac Rehab from 03/24/2021 in [facility] Cardiac and Pulmonary Rehab  Education need identified 01/20/21  Date 01/20/21  Educator [place]  Instruction Review Code 1- Verbalizes Understanding       Education: Exercise Physiology & General Exercise Guidelines: - Group verbal and written instruction with models to review the exercise physiology of the cardiovascular system and associated critical values. Provides general exercise guidelines with specific guidelines to those with heart or lung disease.    Education: Flexibility, Balance, Mind/Body Relaxation: - Group verbal and visual presentation with interactive activity on the components of exercise prescription. Introduces F.I.T.T principle from ACSM for exercise prescriptions.  Reviews F.I.T.T. principles of flexibility and balance exercise training including progression. Also discusses the mind body connection.  Reviews various relaxation techniques to help reduce and manage stress (i.e. Deep breathing, progressive muscle relaxation, and visualization). Balance handout provided to take home. Written material given at graduation. Flowsheet Row Cardiac Rehab from 03/24/2021 in [facility] Cardiac and Pulmonary Rehab  Date 03/24/21  Educator [facility]  Instruction Review Code 1- Verbalizes Understanding       Activity Barriers & Risk Stratification:  Activity Barriers & Cardiac Risk Stratification - 01/20/21 1241       Activity Barriers & Cardiac Risk Stratification   Activity Barriers Other (comment)    Comments Neuropathy- feet    Cardiac Risk Stratification High             6 Minute Walk:  6 Minute Walk     Row Name 01/20/21 1242         6 Minute Walk   Phase Initial     Distance 1360 feet     Walk Time 6 minutes     # of Rest Breaks 0     MPH 2.57     METS 2.79     RPE 9     Perceived Dyspnea  0     VO2 Peak 9.78     Symptoms No     Resting HR 65 bpm      Resting BP 118/68     Resting Oxygen Saturation  96 %     Exercise Oxygen Saturation  during 6 min walk 96 %     Max Ex. HR 75 bpm     Max Ex. BP 128/66     2 Minute Post BP 114/64              Oxygen Initial Assessment:   Oxygen Re-Evaluation:   Oxygen Discharge (Final Oxygen Re-Evaluation):   Initial Exercise Prescription:  Initial Exercise Prescription - 01/20/21 1300       Date of Initial Exercise RX and Referring Provider   Date 01/20/21    Referring Provider [name]      Treadmill   MPH 2.6    Grade 1    Minutes 15    METs 3.35      Recumbant Bike   Level 2    RPM 60    Watts 20    Minutes 15    METs 2.7      NuStep   Level 3    SPM 80    Minutes 15    METs 2.7      T5 Nustep   Level 2    SPM 80    Minutes 15    METs 2.7      Track   Laps 35    Minutes 15    METs 2.9      Prescription Details   Frequency (times per week) 2    Duration Progress to 30 minutes of continuous aerobic without signs/symptoms of physical distress      Intensity   THRR 40-80% of Max Heartrate 97-129    Ratings of Perceived Exertion 11-13    Perceived Dyspnea 0-4      Progression   Progression Continue to progress workloads to maintain intensity without signs/symptoms of physical distress.      Resistance Training   Training Prescription Yes    Weight 5 lb    Reps 10-15  Perform Capillary Blood Glucose checks as needed.  Exercise Prescription Changes:   Exercise Prescription Changes     Row Name 01/20/21 1300 02/02/21 1400 02/12/21 0800 02/16/21 1300 03/01/21 1200     Response to Exercise   Blood Pressure (Admit) 118/68 130/80 -- 124/72 128/64   Blood Pressure (Exercise) 128/66 142/58 -- 130/64 152/64   Blood Pressure (Exit) 114/64 130/70 -- 104/66 108/60   Heart Rate (Admit) 65 bpm 61 bpm -- 67 bpm 71 bpm   Heart Rate (Exercise) 76 bpm 86 bpm -- 78 bpm 102 bpm   Heart Rate (Exit) 60 bpm 70 bpm -- 59 bpm 73 bpm    Oxygen Saturation (Admit) 96 % -- -- -- --   Oxygen Saturation (Exercise) 96 % -- -- -- --   Oxygen Saturation (Exit) 96 % -- -- -- --   Rating of Perceived Exertion (Exercise) 9 11 -- 13 14   Perceived Dyspnea (Exercise) 0 -- -- -- --   Symptoms none none -- none none   Comments walk test results -- -- -- --   Duration -- Continue with 30 min of aerobic exercise without signs/symptoms of physical distress. -- Continue with 30 min of aerobic exercise without signs/symptoms of physical distress. Continue with 30 min of aerobic exercise without signs/symptoms of physical distress.   Intensity -- THRR unchanged -- THRR unchanged THRR unchanged     Progression   Progression -- Continue to progress workloads to maintain intensity without signs/symptoms of physical distress. -- Continue to progress workloads to maintain intensity without signs/symptoms of physical distress. Continue to progress workloads to maintain intensity without signs/symptoms of physical distress.   Average METs -- 3.04 -- 2.4 4.53     Resistance Training   Training Prescription -- Yes -- Yes Yes   Weight -- 5 lb -- 5 lb 7 lb   Reps -- 10-15 -- 10-15 10-15     Interval Training   Interval Training -- No -- No No     Treadmill   MPH -- 2.6 -- -- 3.6   Grade -- 2 -- -- 4.5   Minutes -- 15 -- -- 15   METs -- 3.71 -- -- 5.99     Recumbant Bike   Level -- 4 -- 4 4   RPM -- -- -- -- 50   Watts -- -- -- -- 34   Minutes -- 15 -- 15 15   METs -- -- -- 2.85 2.83     NuStep   Level -- 5 -- -- 5   SPM -- -- -- -- 80   Minutes -- 15 -- -- 15   METs -- 3.4 -- -- 3.5     T5 Nustep   Level -- 3 -- 3 --   Minutes -- 15 -- 15 --   METs -- 2 -- 2 --     Home Exercise Plan   Plans to continue exercise at -- -- [employer] (comment)  [employer] (comment)  [employer] (comment)  [employer]   Frequency -- -- Add 2 additional days to program exercise sessions. Add 2 additional days to program  exercise sessions. Add 2 additional days to program exercise sessions.   Initial Home Exercises Provided -- -- 02/12/21 02/12/21 02/12/21    Row Name 03/16/21 0900 03/29/21 1600           Response to Exercise   Blood Pressure (Admit) 128/70 102/60      Blood Pressure (Exit)  110/62 100/60      Heart Rate (Admit) 65 bpm 63 bpm      Heart Rate (Exercise) 74 bpm --      Heart Rate (Exit) 67 bpm 82 bpm      Oxygen Saturation (Admit) -- 96 %      Oxygen Saturation (Exercise) -- 98 %      Oxygen Saturation (Exit) -- 96 %      Rating of Perceived Exertion (Exercise) 13 13      Symptoms none none      Duration Continue with 30 min of aerobic exercise without signs/symptoms of physical distress. Continue with 30 min of aerobic exercise without signs/symptoms of physical distress.      Intensity THRR unchanged THRR unchanged             Progression      Progression Continue to progress workloads to maintain intensity without signs/symptoms of physical distress. Continue to progress workloads to maintain intensity without signs/symptoms of physical distress.      Average METs 2.5 3.45             Resistance Training      Training Prescription Yes Yes      Weight 7 lb 7 lb      Reps 10-15 10-15             Interval Training      Interval Training No No             Treadmill      MPH -- 3.2      Grade -- 3      Minutes -- 15      METs -- 4.77             Recumbant Bike      Level -- 6      Watts 32 25      Minutes 15 15      METs 2.83 3.05             NuStep      Level -- 7      Minutes -- 15      METs -- 3.8             T5 Nustep      Level 5 6      Minutes 15 15      METs 2.1 2.2             Home Exercise Plan      Plans to continue exercise at [employer] (comment)  [place] (comment)  [employer]      Frequency Add 2 additional days to program exercise sessions. Add 2 additional days to program exercise sessions.      Initial Home Exercises  Provided 02/12/21 02/12/21              Exercise Comments:   Exercise Comments     Row Name 01/22/21 [phone]           Exercise Comments First full day of exercise!  Patient was oriented to gym and equipment including functions, settings, policies, and procedures.  Patient's individual exercise prescription and treatment plan were reviewed.  All starting workloads were established based on the results of the 6 minute walk test done at initial orientation visit.  The plan for exercise progression was also introduced and progression will be customized based on patient's performance and goals.  Exercise Goals and Review:   Exercise Goals     Row Name 01/20/21 1304             Exercise Goals   Increase Physical Activity Yes       Intervention Provide advice, education, support and counseling about physical activity/exercise needs.;Develop an individualized exercise prescription for aerobic and resistive training based on initial evaluation findings, risk stratification, comorbidities and participant's personal goals.       Expected Outcomes Short Term: Attend rehab on a regular basis to increase amount of physical activity.;Long Term: Add in home exercise to make exercise part of routine and to increase amount of physical activity.;Long Term: Exercising regularly at least 3-5 days a week.       Increase Strength and Stamina Yes       Intervention Provide advice, education, support and counseling about physical activity/exercise needs.;Develop an individualized exercise prescription for aerobic and resistive training based on initial evaluation findings, risk stratification, comorbidities and participant's personal goals.       Expected Outcomes Short Term: Increase workloads from initial exercise prescription for resistance, speed, and METs.;Short Term: Perform resistance training exercises routinely during rehab and add in resistance training at home;Long Term: Improve  cardiorespiratory fitness, muscular endurance and strength as measured by increased METs and functional capacity (6MWT)       Able to understand and use rate of perceived exertion (RPE) scale Yes       Intervention Provide education and explanation on how to use RPE scale       Expected Outcomes Short Term: Able to use RPE daily in rehab to express subjective intensity level;Long Term:  Able to use RPE to guide intensity level when exercising independently       Able to understand and use Dyspnea scale Yes       Intervention Provide education and explanation on how to use Dyspnea scale       Expected Outcomes Short Term: Able to use Dyspnea scale daily in rehab to express subjective sense of shortness of breath during exertion;Long Term: Able to use Dyspnea scale to guide intensity level when exercising independently       Knowledge and understanding of Target Heart Rate Range (THRR) Yes       Intervention Provide education and explanation of THRR including how the numbers were predicted and where they are located for reference       Expected Outcomes Short Term: Able to state/look up THRR;Short Term: Able to use daily as guideline for intensity in rehab;Long Term: Able to use THRR to govern intensity when exercising independently       Able to check pulse independently Yes       Intervention Provide education and demonstration on how to check pulse in carotid and radial arteries.;Review the importance of being able to check your own pulse for safety during independent exercise       Expected Outcomes Short Term: Able to explain why pulse checking is important during independent exercise;Long Term: Able to check pulse independently and accurately       Understanding of Exercise Prescription Yes       Intervention Provide education, explanation, and written materials on patient's individual exercise prescription       Expected Outcomes Short Term: Able to explain program exercise prescription;Long  Term: Able to explain home exercise prescription to exercise independently                Exercise Goals Re-Evaluation :  Exercise Goals Re-Evaluation     Row Name 01/22/21 [phone] 02/02/21 1448 02/12/21 0838 02/16/21 1336 02/19/21 0817     Exercise Goal Re-Evaluation   Exercise Goals Review Able to understand and use rate of perceived exertion (RPE) scale;Able to understand and use Dyspnea scale;Knowledge and understanding of Target Heart Rate Range (THRR);Understanding of Exercise Prescription Increase Physical Activity;Increase Strength and Stamina;Understanding of Exercise Prescription Increase Physical Activity;Increase Strength and Stamina;Understanding of Exercise Prescription Increase Physical Activity;Increase Strength and Stamina Increase Physical Activity;Increase Strength and Stamina   Comments Reviewed RPE and dyspnea scales, THR and program prescription with pt today.  Pt voiced understanding and was given a copy of goals to take home. Phillip [name] is off to a good start. He has even added in his third day!!  He is up to level 5 on NuStep and 2% grade on treadmill.  We will continue to monitor his progress. Reviewed home exercise with pt today.  Pt plans to go the [facility] for exercise. Really encouraged aerobic exericse outside of rehab to help maintain his endurance and continue resistance training as well. Patient plans to buy a pulse ox to check his HR. Reviewed THR, pulse, RPE, sign and symptoms, pulse oximetery and when to call 911 or MD.  Also discussed weather considerations and indoor options.  Pt voiced understanding. Phillip [name] attends consistently and works at [employer].  He has increased workloads on machines.  Staff will monitor progress. [name]. [name]   Expected Outcomes Short: Use RPE daily to regulate intensity. Long:  Follow program prescription in THR. Short: Continue to attend regularly Long: Continue to improve stamina. Short: Check HR during exercise Long: Exercise at home at appropriate exercise prescription Short:  coninue to attend consistently Long:  build overall stamina Short: continue to improve in [facility], Long: maintain exercise independently.    [place] Name 03/01/21 1237 03/16/21 0910 03/29/21 1610         Exercise Goal Re-Evaluation   Exercise Goals Review Increase Physical Activity;Increase Strength and Stamina Increase Physical Activity;Increase Strength and Stamina Increase Physical Activity;Increase Strength and Stamina;Understanding of Exercise Prescription     Comments Phillip [name] is doing great. He has increased his resistance training to 7 lb and increased his treadmill speed/incline on treadmill to 3.6/4.5%. Will continue to monitor. Phillip [name] continues to do well. He has played golf a couple times after sessions. Phillip [name] has been doing well in rehab.  He is up to level 6 on the bike and T5 NuStep.  We will continue to monitor his progress     Expected Outcomes Short: Continue to increase loads on machines Long: Continue to improve overall MET level Short: continue to attend consistently Long:  improve MET level Short:Move treadmill back up again Long; Continue to improve stamina              Discharge Exercise Prescription (Final Exercise Prescription Changes):  Exercise Prescription Changes - 03/29/21 1600       Response to Exercise   Blood Pressure (Admit) 102/60    Blood Pressure (Exit) 100/60    Heart Rate (Admit) 63 bpm    Heart Rate (Exit) 82 bpm    Oxygen Saturation (Admit) 96 %    Oxygen Saturation (Exercise) 98 %    Oxygen Saturation (Exit)  96 %    Rating of Perceived Exertion (Exercise) 13    Symptoms none    Duration Continue with 30 min of aerobic exercise without signs/symptoms of physical distress.    Intensity THRR unchanged      Progression   Progression Continue to  progress workloads to maintain intensity without signs/symptoms of physical distress.    Average METs 3.45      Resistance Training   Training Prescription Yes    Weight 7 lb    Reps 10-15      Interval Training   Interval Training No      Treadmill   MPH 3.2    Grade 3    Minutes 15    METs 4.77      Recumbant Bike   Level 6    Watts 25    Minutes 15    METs 3.05      NuStep   Level 7    Minutes 15    METs 3.8      T5 Nustep   Level 6    Minutes 15    METs 2.2      Home Exercise Plan   Plans to continue exercise at [employer] (comment)   [employer]   Frequency Add 2 additional days to program exercise sessions.    Initial Home Exercises Provided 02/12/21             Nutrition:  Target Goals: Understanding of nutrition guidelines, daily intake of sodium <1547m, cholesterol <2051m calories 30% from fat and 7% or less from saturated fats, daily to have 5 or more servings of fruits and vegetables.  Education: All About Nutrition: -Group instruction provided by verbal, written material, interactive activities, discussions, models, and posters to present general guidelines for heart healthy nutrition including fat, fiber, MyPlate, the role of sodium in heart healthy nutrition, utilization of the nutrition label, and utilization of this knowledge for meal planning. Follow up email sent as well. Written material given at graduation.   Biometrics:  Pre Biometrics - 01/20/21 1241       Pre Biometrics   Height 5' 9.5" (1.765 m)    Weight 164 lb 11.2 oz (74.7 kg)    BMI (Calculated) 23.98    Single Leg Stand 30 seconds              Nutrition Therapy Plan and Nutrition Goals:  Nutrition Therapy & Goals - 02/08/21 0721       Nutrition Therapy   Diet Heart healthy, low Na    Drug/Food Interactions Coumadin/Vit K;Statins/Certain Fruits    Protein (specify units) 60g    Fiber 30 grams    Whole Grain Foods 3 servings    Saturated Fats 12 max. grams     Fruits and Vegetables 8 servings/day    Sodium 1.5 grams      Personal Nutrition Goals   Nutrition Goal ST:LT: KeYvone Neuould not like to make any changes at this time.    Comments B: oatmeal L: sandwich ([place] whole wheat - most of the time) D: meat (chicken or salmon patties - will had red meat 1-2x/week) and a couple of vegetables (beans, leafy greens (not as often due to warfarin), potatoes, salads. He will use canola oil and butter. they use mrs. dash. He reports using almond milk and limits dairy. He also reports drinking sugar free lemonaide and decaf coffee with no sugar creamer during the day as well as water - not as much as  he woiuld like. Discussed heart healthy eating and vitamin K content of foods - provided handouts. Patient would not like to make any changes at this time.      Intervention Plan   Intervention Prescribe, educate and counsel regarding individualized specific dietary modifications aiming towards targeted core components such as weight, hypertension, lipid management, diabetes, heart failure and other comorbidities.;Nutrition handout(s) given to patient.    Expected Outcomes Short Term Goal: Understand basic principles of dietary content, such as calories, fat, sodium, cholesterol and nutrients.;Short Term Goal: A plan has been developed with personal nutrition goals set during dietitian appointment.;Long Term Goal: Adherence to prescribed nutrition plan.             Nutrition Assessments:  MEDIFICTS Score Key: ?70 Need to make dietary changes  40-70 Heart Healthy Diet ? 40 Therapeutic Level Cholesterol Diet  Flowsheet Row Cardiac Rehab from 01/20/2021 in [facility] Rehab  Picture Your Plate Total Score on Admission 61      Picture Your Plate Scores: <68 Unhealthy dietary pattern with much room for improvement. 41-50 Dietary pattern unlikely to meet recommendations for good health and room for improvement. 51-60 More healthful dietary pattern,  with some room for improvement.  >60 Healthy dietary pattern, although there may be some specific behaviors that could be improved.    Nutrition Goals Re-Evaluation:  Nutrition Goals Re-Evaluation     [place] Name 02/19/21 0823 03/19/21 0812           Goals   Current Weight 167 lb (75.8 kg) 166 lb (75.3 kg)      Nutrition Goal Maintain weight. Watch sugars. Maintain portion control      Comment Phillip [name] has been trying to eat less pork and has changed some of his eating habits. He states that he could eat some more vegatables. Phillip [name] has been watching his cholesterol and states is cholesterol was the only thing that was a little higher. His doctor has prescribed him crestor.      Expected Outcome Short: maintain current weight. Long: stay on a diet that pertains to his health. Short: maintain a lower cholesterol diet. Long: take medications and keep cholesterol low.               Nutrition Goals Discharge (Final Nutrition Goals Re-Evaluation):  Nutrition Goals Re-Evaluation - 03/19/21 0812       Goals   Current Weight 166 lb (75.3 kg)    Nutrition Goal Watch sugars. Maintain portion control    Comment Phillip [name] has been watching his cholesterol and states is cholesterol was the only thing that was a little higher. His doctor has prescribed him crestor.    Expected Outcome Short: maintain a lower cholesterol diet. Long: take medications and keep cholesterol low.             Psychosocial: Target Goals: Acknowledge presence or absence of significant depression and/or stress, maximize coping skills, provide positive support system. Participant is able to verbalize types and ability to use techniques and skills needed for reducing stress and depression.   Education: Stress, Anxiety, and Depression - Group verbal and visual presentation to define topics covered.  Reviews how body is impacted by stress, anxiety, and depression.  Also discusses healthy ways to reduce stress and to treat/manage  anxiety and depression.  Written material given at graduation.   Education: Sleep Hygiene -Provides group verbal and written instruction about how sleep can affect your health.  Define sleep hygiene, discuss sleep cycles and impact  of sleep habits. Review good sleep hygiene tips.    Initial Review & Psychosocial Screening:  Initial Psych Review & Screening - 01/18/21 1111       Initial Review   Current issues with None Identified      Family Dynamics   Good Support System? Yes      Barriers   Psychosocial barriers to participate in program There are no identifiable barriers or psychosocial needs.      Screening Interventions   Interventions Encouraged to exercise;To provide support and resources with identified psychosocial needs;Provide feedback about the scores to participant    Expected Outcomes Short Term goal: Utilizing psychosocial counselor, staff and physician to assist with identification of specific Stressors or current issues interfering with healing process. Setting desired goal for each stressor or current issue identified.;Long Term Goal: Stressors or current issues are controlled or eliminated.;Short Term goal: Identification and review with participant of any Quality of Life or Depression concerns found by scoring the questionnaire.;Long Term goal: The participant improves quality of Life and PHQ9 Scores as seen by post scores and/or verbalization of changes             Quality of Life Scores:   Quality of Life - 01/20/21 1253       Quality of Life   Select Quality of Life      Quality of Life Scores   Health/Function Pre 25.8 %    Socioeconomic Pre 29.64 %    Psych/Spiritual Pre 28.07 %    Family Pre 24.6 %    GLOBAL Pre 26.88 %            Scores of 19 and below usually indicate a poorer quality of life in these areas.  A difference of  2-3 points is a clinically meaningful difference.  A difference of 2-3 points in the total score of the Quality of  Life Index has been associated with significant improvement in overall quality of life, self-image, physical symptoms, and general health in studies assessing change in quality of life.  PHQ-9: Recent Review Flowsheet Data     Depression screen [facility] 2/9 01/20/2021   Decreased Interest 0   Down, Depressed, Hopeless 0   PHQ - 2 Score 0   Altered sleeping 2   Tired, decreased energy 0   Change in appetite 0   Feeling bad or failure about yourself  0   Trouble concentrating 1   Moving slowly or fidgety/restless 0   Suicidal thoughts 0   PHQ-9 Score 3   Difficult doing work/chores Not difficult at all      Interpretation of Total Score  Total Score Depression Severity:  1-4 = Minimal depression, 5-9 = Mild depression, 10-14 = Moderate depression, 15-19 = Moderately severe depression, 20-27 = Severe depression   Psychosocial Evaluation and Intervention:  Psychosocial Evaluation - 01/18/21 1119       Psychosocial Evaluation & Interventions   Interventions Encouraged to exercise with the program and follow exercise prescription    Comments 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 They are unsure why it went away, he said one doctor thinnks maybe the vasodilators and blood thinners. He was seeing a clinic for months trying to figure it all out, but he is taking it day by day and is hopeful it stays away. He sleeps well, enjoys golf, and doesn't report any major stressors.  He wants to keep being active and is hopeful this program accompanies his lifestyle well.    Expected Outcomes Short: attend cardiac rehab for education and exercise. Long: develop positive self care habits.    Continue Psychosocial Services  Follow up required by staff             Psychosocial Re-Evaluation:  Psychosocial Re-Evaluation     [place] Name 02/19/21 0825 03/19/21 0810           Psychosocial  Re-Evaluation   Current issues with None Identified None Identified      Comments Patient reports no issues with their current mental states, sleep, stress, depression or anxiety. Will follow up with patient in a few weeks for any changes. Phillip [name] has been playing gold alot recently. He has been doing well in the program and has no issues with his mental health. Golf is Kens way of relaxing and he plays golf a couple times a week.      Expected Outcomes Short: Continue to exercise regularly to support mental health and notify staff of any changes. Long: maintain mental health and well being through teaching of rehab or prescribed medications independently. Short: Continue to exercise regularly to support mental health and notify staff of any changes. Long: maintain mental health and well being through teaching of rehab or prescribed medications independently.      Interventions Encouraged to attend Cardiac Rehabilitation for the exercise Encouraged to attend Cardiac Rehabilitation for the exercise      Continue Psychosocial Services  Follow up required by staff Follow up required by staff               Psychosocial Discharge (Final Psychosocial Re-Evaluation):  Psychosocial Re-Evaluation - 03/19/21 0810       Psychosocial Re-Evaluation   Current issues with None Identified    Comments Phillip [name] has been playing gold alot recently. He has been doing well in the program and has no issues with his mental health. Golf is Kens way of relaxing and he plays golf a couple times a week.    Expected Outcomes Short: Continue to exercise regularly to support mental health and notify staff of any changes. Long: maintain mental health and well being through teaching of rehab or prescribed medications independently.    Interventions Encouraged to attend Cardiac Rehabilitation for the exercise    Continue Psychosocial Services  Follow up required by staff             Vocational Rehabilitation: Provide  vocational rehab assistance to qualifying candidates.   Vocational Rehab Evaluation & Intervention:  Vocational Rehab - 01/18/21 1110       Initial Vocational Rehab Evaluation & Intervention   Assessment shows need for Vocational Rehabilitation No             Education: Education Goals: Education classes will be provided on a variety of topics geared toward better understanding of heart health and risk factor modification. Participant will state understanding/return demonstration of topics presented as noted by education test scores.  Learning Barriers/Preferences:  Learning Barriers/Preferences - 01/18/21 1109       Learning Barriers/Preferences   Learning Barriers None    Learning Preferences None             General Cardiac Education Topics:  AED/CPR: - Group verbal and written instruction with the use of models to demonstrate the basic use of the AED with the basic ABC's of resuscitation.   Anatomy and Cardiac Procedures: - Group verbal  and visual presentation and models provide information about basic cardiac anatomy and function. Reviews the testing methods done to diagnose heart disease and the outcomes of the test results. Describes the treatment choices: Medical Management, Angioplasty, or Coronary Bypass Surgery for treating various heart conditions including Myocardial Infarction, Angina, Valve Disease, and Cardiac Arrhythmias.  Written material given at graduation.   Medication Safety: - Group verbal and visual instruction to review commonly prescribed medications for heart and lung disease. Reviews the medication, class of the drug, and side effects. Includes the steps to properly store meds and maintain the prescription regimen.  Written material given at graduation. Flowsheet Row Cardiac Rehab from 03/24/2021 in [facility] Cardiac and Pulmonary Rehab  Date 02/03/21  Educator SB  Instruction Review Code 1- Verbalizes Understanding       Intimacy: - Group  verbal instruction through game format to discuss how heart and lung disease can affect sexual intimacy. Written material given at graduation.. Flowsheet Row Cardiac Rehab from 03/24/2021 in [facility] Cardiac and Pulmonary Rehab  Date 03/10/21  Educator [facility]  Instruction Review Code 1- Verbalizes Understanding       Know Your Numbers and Heart Failure: - Group verbal and visual instruction to discuss disease risk factors for cardiac and pulmonary disease and treatment options.  Reviews associated critical values for Overweight/Obesity, Hypertension, Cholesterol, and Diabetes.  Discusses basics of heart failure: signs/symptoms and treatments.  Introduces Heart Failure Zone chart for action plan for heart failure.  Written material given at graduation.   Infection Prevention: - Provides verbal and written material to individual with discussion of infection control including proper hand washing and proper equipment cleaning during exercise session. Flowsheet Row Cardiac Rehab from 03/24/2021 in [facility] Cardiac and Pulmonary Rehab  Education need identified 01/20/21  Date 01/20/21  Educator [place]  Instruction Review Code 1- Verbalizes Understanding       Falls Prevention: - Provides verbal and written material to individual with discussion of falls prevention and safety. Flowsheet Row Cardiac Rehab from 03/24/2021 in [facility] Cardiac and Pulmonary Rehab  Education need identified 01/20/21  Date 01/20/21  Educator [place]  Instruction Review Code 1- Verbalizes Understanding       Other: -Provides group and verbal instruction on various topics (see comments)   Knowledge Questionnaire Score:  Knowledge Questionnaire Score - 01/20/21 1257       Knowledge Questionnaire Score   Pre Score 24/26: Angina, Exercise             Core Components/Risk Factors/Patient Goals at Admission:  Personal Goals and Risk Factors at Admission - 01/20/21 1304       Core Components/Risk Factors/Patient Goals on  Admission    Weight Management Yes;Weight Maintenance    Intervention Weight Management: Develop a combined nutrition and exercise program designed to reach desired caloric intake, while maintaining appropriate intake of nutrient and fiber, sodium and fats, and appropriate energy expenditure required for the weight goal.;Weight Management: Provide education and appropriate resources to help participant work on and attain dietary goals.;Weight Management/Obesity: Establish reasonable short term and long term weight goals.    Admit Weight 164 lb (74.4 kg)    Goal Weight: Short Term 164 lb (74.4 kg)    Goal Weight: Long Term 164 lb (74.4 kg)    Expected Outcomes Short Term: Continue to assess and modify interventions until short term weight is achieved;Long Term: Adherence to nutrition and physical activity/exercise program aimed toward attainment of established weight goal;Weight Maintenance: Understanding of the daily nutrition guidelines, which  includes 25-35% calories from fat, 7% or less cal from saturated fats, less than 237m cholesterol, less than 1.5gm of sodium, & 5 or more servings of fruits and vegetables daily;Understanding recommendations for meals to include 15-35% energy as protein, 25-35% energy from fat, 35-60% energy from carbohydrates, less than 2066mof dietary cholesterol, 20-35 gm of total fiber daily;Understanding of distribution of calorie intake throughout the day with the consumption of 4-5 meals/snacks    Hypertension Yes    Intervention Provide education on lifestyle modifcations including regular physical activity/exercise, weight management, moderate sodium restriction and increased consumption of fresh fruit, vegetables, and low fat dairy, alcohol moderation, and smoking cessation.;Monitor prescription use compliance.    Expected Outcomes Short Term: Continued assessment and intervention until BP is < 140/9050mG in hypertensive participants. < 130/65m2m in hypertensive  participants with diabetes, heart failure or chronic kidney disease.;Long Term: Maintenance of blood pressure at goal levels.    Lipids Yes    Intervention Provide education and support for participant on nutrition & aerobic/resistive exercise along with prescribed medications to achieve LDL <70mg39mL >40mg.49mExpected Outcomes Short Term: Participant states understanding of desired cholesterol values and is compliant with medications prescribed. Participant is following exercise prescription and nutrition guidelines.;Long Term: Cholesterol controlled with medications as prescribed, with individualized exercise RX and with personalized nutrition plan. Value goals: LDL < 70mg, 35m> 40 mg.             Education:Diabetes - Individual verbal and written instruction to review signs/symptoms of diabetes, desired ranges of glucose level fasting, after meals and with exercise. Acknowledge that pre and post exercise glucose checks will be done for 3 sessions at entry of program.   Core Components/Risk Factors/Patient Goals Review:   Goals and Risk Factor Review     [place] Name 02/19/21 0820 03/19/21 0806           Core Components/Risk Factors/Patient Goals Review   Personal Goals Review Weight Management/Obesity;Hypertension Hypertension      Review Ken wanYvone Neuto maintain his weight which he is doing. His blood pressure has been good in the program but does not check it at home. He knows where to get one and will look into it. His blood pressure has been good in the program but does not check it at home. Informed him that he should check it at home so he knows his numbers. He said he has no means to check it but informed him where to purchase one.      Expected Outcomes Short: obtain a blood pressure cuff. Long:maintain blood pressure readings  at home. Short: obtain a blood pressure cuff. Long:maintain blood pressure readings  at home.               Core Components/Risk Factors/Patient Goals  at Discharge (Final Review):   Goals and Risk Factor Review - 03/19/21 0806       Core Components/Risk Factors/Patient Goals Review   Personal Goals Review Hypertension    Review His blood pressure has been good in the program but does not check it at home. Informed him that he should check it at home so he knows his numbers. He said he has no means to check it but informed him where to purchase one.    Expected Outcomes Short: obtain a blood pressure cuff. Long:maintain blood pressure readings  at home.             ITP Comments:  ITP Comments  [place] Name 01/18/21 1122 01/20/21 1237 01/22/21 0843 02/03/21 0605 03/03/21 0629   ITP Comments Initial telephone orienation completed. Diagnosis can be found in [facility] 4/19. EP orientation scheduled for Wednesday 5/4 at 11am. Completed 6MWT and gym orientation. Initial ITP created and sent for review to Dr. Emily Filbert, Medical Director. First full day of exercise!  Patient was oriented to gym and equipment including functions, settings, policies, and procedures.  Patient's individual exercise prescription and treatment plan were reviewed.  All starting workloads were established based on the results of the 6 minute walk test done at initial orientation visit.  The plan for exercise progression was also introduced and progression will be customized based on patient's performance and goals. 30 Day review completed. Medical Director ITP review done, changes made as directed, and signed approval by Medical Director.  New to program 30 Day review completed. Medical Director ITP review done, changes made as directed, and signed approval by Medical Director.    [place] Name 03/31/21 0554           ITP Comments 30 Day review completed. Medical Director ITP review done, changes made as directed, and signed approval by Medical Director.                Comments:

## 2021-03-31 NOTE — Progress Notes (Signed)
Daily Session Note  Patient Details  Name: Phillip [name] MRN: [id number] Date of Birth: 1946/02/27 Referring Provider:   Flowsheet Row Cardiac Rehab from 01/20/2021 in [facility] Cardiac and Pulmonary Rehab  Referring Provider [name]       Encounter Date: 03/31/2021  Check In:  Session Check In - 03/31/21 0837       Check-In   Supervising physician immediately available to respond to emergencies See telemetry face sheet for immediately available ER MD    Location [facility]-Cardiac & Pulmonary Rehab    Staff Present Heath Lark, RN, BSN, CCRP;Kelly Bollinger, MPA, RN;Jessica [place], MA, RCEP, CCRP, CCET    Virtual Visit No    Medication changes reported     No    Fall or balance concerns reported    No    Warm-up and Cool-down Performed on first and last piece of equipment    Resistance Training Performed Yes    VAD Patient? No    PAD/SET Patient? No      Pain Assessment   Currently in Pain? No/denies                Social History   Tobacco Use  Smoking Status Never  Smokeless Tobacco Never    Goals Met:  Independence with exercise equipment Exercise tolerated well No report of cardiac concerns or symptoms  Goals Unmet:  Not Applicable  Comments: Pt able to follow exercise prescription today without complaint.  Will continue to monitor for progression.    Dr. Emily Filbert is Medical Director for [place].  Dr. Ottie Glazier is Medical Director for [facility] Pulmonary Rehabilitation.

## 2021-04-02 ENCOUNTER — Encounter: Payer: Medicare Other | Admitting: *Deleted

## 2021-04-02 ENCOUNTER — Other Ambulatory Visit: Payer: Self-pay

## 2021-04-02 DIAGNOSIS — I214 Non-ST elevation (NSTEMI) myocardial infarction: Secondary | ICD-10-CM

## 2021-04-02 DIAGNOSIS — Z5189 Encounter for other specified aftercare: Secondary | ICD-10-CM | POA: Diagnosis not present

## 2021-04-02 DIAGNOSIS — Z955 Presence of coronary angioplasty implant and graft: Secondary | ICD-10-CM

## 2021-04-02 NOTE — Progress Notes (Signed)
Daily Session Note  Patient Details  Name: Phillip [name] MRN: [id number] Date of Birth: December 02, 1945 Referring Provider:   Flowsheet Row Cardiac Rehab from 01/20/2021 in [facility] Cardiac and Pulmonary Rehab  Referring Provider [name]       Encounter Date: 04/02/2021  Check In:  Session Check In - 04/02/21 0831       Check-In   Supervising physician immediately available to respond to emergencies See telemetry face sheet for immediately available ER MD    Location [facility]-Cardiac & Pulmonary Rehab    Staff Present Heath Lark, RN, BSN, CCRP;Melissa [place], RDN, LDN;Joseph [place], [place]    Virtual Visit No    Medication changes reported     No    Fall or balance concerns reported    No    Warm-up and Cool-down Performed on first and last piece of equipment    Resistance Training Performed Yes    VAD Patient? No    PAD/SET Patient? No      Pain Assessment   Currently in Pain? No/denies                Social History   Tobacco Use  Smoking Status Never  Smokeless Tobacco Never    Goals Met:  Independence with exercise equipment Exercise tolerated well No report of cardiac concerns or symptoms  Goals Unmet:  Not Applicable  Comments: Pt able to follow exercise prescription today without complaint.  Will continue to monitor for progression.    Dr. Emily Filbert is Medical Director for [place].  Dr. Ottie Glazier is Medical Director for [facility] Pulmonary Rehabilitation.

## 2021-04-05 ENCOUNTER — Other Ambulatory Visit: Payer: Self-pay

## 2021-04-05 ENCOUNTER — Encounter: Payer: Medicare Other | Admitting: *Deleted

## 2021-04-05 DIAGNOSIS — Z955 Presence of coronary angioplasty implant and graft: Secondary | ICD-10-CM

## 2021-04-05 DIAGNOSIS — Z5189 Encounter for other specified aftercare: Secondary | ICD-10-CM | POA: Diagnosis not present

## 2021-04-05 DIAGNOSIS — I214 Non-ST elevation (NSTEMI) myocardial infarction: Secondary | ICD-10-CM

## 2021-04-05 NOTE — Progress Notes (Signed)
Daily Session Note  Patient Details  Name: Phillip [name] MRN: [id number] Date of Birth: May 16, 1946 Referring Provider:   Flowsheet Row Cardiac Rehab from 01/20/2021 in [facility] Cardiac and Pulmonary Rehab  Referring Provider [name]       Encounter Date: 04/05/2021  Check In:  Session Check In - 04/05/21 0921       Check-In   Supervising physician immediately available to respond to emergencies See telemetry face sheet for immediately available ER MD    Location [facility]-Cardiac & Pulmonary Rehab    Staff Present Heath Lark, RN, BSN, Laveda Norman, BS, ACSM CEP, Exercise Physiologist;Joseph [place], [place]    Virtual Visit No    Medication changes reported     No    Fall or balance concerns reported    No    Warm-up and Cool-down Performed on first and last piece of equipment    Resistance Training Performed Yes    VAD Patient? No    PAD/SET Patient? No      Pain Assessment   Currently in Pain? No/denies                Social History   Tobacco Use  Smoking Status Never  Smokeless Tobacco Never    Goals Met:  Independence with exercise equipment Exercise tolerated well No report of cardiac concerns or symptoms  Goals Unmet:  Not Applicable  Comments: Pt able to follow exercise prescription today without complaint.  Will continue to monitor for progression.    Dr. Emily Filbert is Medical Director for [place].  Dr. Ottie Glazier is Medical Director for [facility] Pulmonary Rehabilitation.

## 2021-04-07 ENCOUNTER — Other Ambulatory Visit: Payer: Self-pay

## 2021-04-07 DIAGNOSIS — I214 Non-ST elevation (NSTEMI) myocardial infarction: Secondary | ICD-10-CM

## 2021-04-07 DIAGNOSIS — Z5189 Encounter for other specified aftercare: Secondary | ICD-10-CM | POA: Diagnosis not present

## 2021-04-07 DIAGNOSIS — Z955 Presence of coronary angioplasty implant and graft: Secondary | ICD-10-CM

## 2021-04-07 NOTE — Progress Notes (Signed)
Daily Session Note  Patient Details  Name: Phillip [name] MRN: [id number] Date of Birth: 27-Nov-1945 Referring Provider:   Flowsheet Row Cardiac Rehab from 01/20/2021 in [facility] Cardiac and Pulmonary Rehab  Referring Provider [name]       Encounter Date: 04/07/2021  Check In:  Session Check In - 04/07/21 0744       Check-In   Supervising physician immediately available to respond to emergencies See telemetry face sheet for immediately available ER MD    Location [facility]-Cardiac & Pulmonary Rehab    Staff Present Birdie Sons, MPA, Elveria Rising, BA, ACSM CEP, Exercise Physiologist;Joseph Tessie Fass, [place]    Virtual Visit No    Medication changes reported     No    Fall or balance concerns reported    No    Warm-up and Cool-down Performed on first and last piece of equipment    Resistance Training Performed Yes    VAD Patient? No    PAD/SET Patient? No      Pain Assessment   Currently in Pain? No/denies                Social History   Tobacco Use  Smoking Status Never  Smokeless Tobacco Never    Goals Met:  Independence with exercise equipment Exercise tolerated well No report of cardiac concerns or symptoms Strength training completed today  Goals Unmet:  Not Applicable  Comments: Pt able to follow exercise prescription today without complaint.  Will continue to monitor for progression.    Dr. Emily Filbert is Medical Director for [place].  Dr. Ottie Glazier is Medical Director for [facility] Pulmonary Rehabilitation.

## 2021-04-09 ENCOUNTER — Encounter: Payer: Medicare Other | Admitting: *Deleted

## 2021-04-09 ENCOUNTER — Other Ambulatory Visit: Payer: Self-pay

## 2021-04-09 VITALS — Ht 69.5 in | Wt 169.9 lb

## 2021-04-09 DIAGNOSIS — Z955 Presence of coronary angioplasty implant and graft: Secondary | ICD-10-CM

## 2021-04-09 DIAGNOSIS — Z5189 Encounter for other specified aftercare: Secondary | ICD-10-CM | POA: Diagnosis not present

## 2021-04-09 DIAGNOSIS — I214 Non-ST elevation (NSTEMI) myocardial infarction: Secondary | ICD-10-CM

## 2021-04-09 NOTE — Progress Notes (Signed)
Daily Session Note  Patient Details  Name: Phillip [name] MRN: [id number] Date of Birth: 12/15/1945 Referring Provider:   Flowsheet Row Cardiac Rehab from 01/20/2021 in [facility] Cardiac and Pulmonary Rehab  Referring Provider [name]       Encounter Date: 04/09/2021  Check In:  Session Check In - 04/09/21 0806       Check-In   Supervising physician immediately available to respond to emergencies See telemetry face sheet for immediately available ER MD    Location [facility]-Cardiac & Pulmonary Rehab    Staff Present Heath Lark, RN, BSN, CCRP;Jessica [place], MA, RCEP, CCRP, CCET;Joseph [place], [place]    Virtual Visit No    Medication changes reported     No    Fall or balance concerns reported    No    Warm-up and Cool-down Performed on first and last piece of equipment    Resistance Training Performed Yes    VAD Patient? No    PAD/SET Patient? No      Pain Assessment   Currently in Pain? No/denies                Social History   Tobacco Use  Smoking Status Never  Smokeless Tobacco Never    Goals Met:  Independence with exercise equipment Exercise tolerated well No report of cardiac concerns or symptoms  Goals Unmet:  Not Applicable  Comments: Pt able to follow exercise prescription today without complaint.  Will continue to monitor for progression.   [place] Name 01/20/21 1242 04/09/21 0826       6 Minute Walk   Phase Initial Discharge    Distance 1360 feet 1705 feet    Distance % Change -- 25.4 %    Distance Feet Change -- 345 ft    Walk Time 6 minutes 6 minutes    # of Rest Breaks 0 0    MPH 2.57 3.23    METS 2.79 3.53    RPE 9 13    Perceived Dyspnea  0 0    VO2 Peak 9.78 12.34    Symptoms No No    Resting HR 65 bpm 61 bpm    Resting BP 118/68 126/64    Resting Oxygen Saturation  96 % 98 %    Exercise Oxygen Saturation  during 6 min walk 96 % 95 %    Max Ex. HR 75 bpm 89 bpm    Max Ex. BP 128/66 136/76    2 Minute  Post BP 114/64 --               Dr. Emily Filbert is Medical Director for [place].  Dr. Ottie Glazier is Medical Director for [facility] Pulmonary Rehabilitation.

## 2021-04-12 ENCOUNTER — Encounter: Payer: Medicare Other | Admitting: *Deleted

## 2021-04-12 ENCOUNTER — Other Ambulatory Visit: Payer: Self-pay

## 2021-04-12 DIAGNOSIS — Z5189 Encounter for other specified aftercare: Secondary | ICD-10-CM | POA: Diagnosis not present

## 2021-04-12 DIAGNOSIS — Z955 Presence of coronary angioplasty implant and graft: Secondary | ICD-10-CM

## 2021-04-12 DIAGNOSIS — I214 Non-ST elevation (NSTEMI) myocardial infarction: Secondary | ICD-10-CM

## 2021-04-12 NOTE — Progress Notes (Signed)
Daily Session Note  Patient Details  Name: Phillip [name] MRN: [id number] Date of Birth: 02/05/1946 Referring Provider:   Flowsheet Row Cardiac Rehab from 01/20/2021 in [facility] Cardiac and Pulmonary Rehab  Referring Provider Callwood, Dwayne       Encounter Date: 04/12/2021  Check In:  Session Check In - 04/12/21 0800       Check-In   Supervising physician immediately available to respond to emergencies See telemetry face sheet for immediately available ER MD    Location [facility]Cardiac & Pulmonary Rehab    Staff Present  , RN, BSN, CCRP;Kelly Hayes, BS, ACSM CEP, Exercise Physiologist;Joseph [place], RCP,RRT,BSRT    Virtual Visit No    Medication changes reported     No    Fall or balance concerns reported    No    Warm-up and Cool-down Performed on first and last piece of equipment    Resistance Training Performed Yes    VAD Patient? No    PAD/SET Patient? No      Pain Assessment   Currently in Pain? No/denies                Social History   Tobacco Use  Smoking Status Never  Smokeless Tobacco Never    Goals Met:  Independence with exercise equipment Exercise tolerated well No report of cardiac concerns or symptoms  Goals Unmet:  Not Applicable  Comments: Pt able to follow exercise prescription today without complaint.  Will continue to monitor for progression.    Dr. Mark Miller is Medical Director for [facility].  Dr. Fuad Aleskerov is Medical Director for [facility] Pulmonary Rehabilitation. 

## 2021-04-14 ENCOUNTER — Other Ambulatory Visit: Payer: Self-pay

## 2021-04-14 DIAGNOSIS — Z955 Presence of coronary angioplasty implant and graft: Secondary | ICD-10-CM

## 2021-04-14 DIAGNOSIS — Z5189 Encounter for other specified aftercare: Secondary | ICD-10-CM | POA: Diagnosis not present

## 2021-04-14 DIAGNOSIS — I214 Non-ST elevation (NSTEMI) myocardial infarction: Secondary | ICD-10-CM

## 2021-04-14 NOTE — Progress Notes (Signed)
Daily Session Note  Patient Details  Name: Phillip [name] MRN: [id number] Date of Birth: 1946-03-19 Referring Provider:   Flowsheet Row Cardiac Rehab from 01/20/2021 in [facility] Cardiac and Pulmonary Rehab  Referring Provider [name]       Encounter Date: 04/14/2021  Check In:  Session Check In - 04/14/21 0808       Check-In   Supervising physician immediately available to respond to emergencies See telemetry face sheet for immediately available ER MD    Location [facility]Card[facility] Pulmonary Rehab    Staff Present Birdie Sons, MPA, Elveria Rising, BA, ACSM CEP, Exercise Physiologist;Joseph Tessie Fass, [place]    Virtual Visit No    Medication changes reported     No    Fall or balance concerns reported    No    Warm-up and Cool-down Performed on first and last piece of equipment    Resistance Training Performed Yes    VAD Patient? No    PAD/SET Patient? No      Pain Assessment   Currently in Pain? No/denies                Social History   Tobacco Use  Smoking Status Never  Smokeless Tobacco Never    Goals Met:  Independence with exercise equipment Exercise tolerated well No report of cardiac concerns or symptoms Strength training completed today  Goals Unmet:  Not Applicable  Comments: Pt able to follow exercise prescription today without complaint.  Will continue to monitor for progression.    Dr. Emily Filbert is Medical Director for [place].  Dr. Ottie Glazier is Medical Director for [facility] Pulmonary Rehabilitation.

## 2021-04-16 ENCOUNTER — Other Ambulatory Visit: Payer: Self-pay

## 2021-04-16 ENCOUNTER — Encounter: Payer: Medicare Other | Admitting: *Deleted

## 2021-04-16 DIAGNOSIS — Z5189 Encounter for other specified aftercare: Secondary | ICD-10-CM | POA: Diagnosis not present

## 2021-04-16 DIAGNOSIS — Z955 Presence of coronary angioplasty implant and graft: Secondary | ICD-10-CM

## 2021-04-16 DIAGNOSIS — I214 Non-ST elevation (NSTEMI) myocardial infarction: Secondary | ICD-10-CM

## 2021-04-16 NOTE — Progress Notes (Signed)
Daily Session Note  Patient Details  Name: Phillip [name] MRN: [id number] Date of Birth: 27-Dec-1945 Referring Provider:   Flowsheet Row Cardiac Rehab from 01/20/2021 in [facility] Cardiac and Pulmonary Rehab  Referring Provider [name]       Encounter Date: 04/16/2021  Check In:  Session Check In - 04/16/21 0816       Check-In   Supervising physician immediately available to respond to emergencies See telemetry face sheet for immediately available ER MD    Location [facility]Cardiac & Pulmonary Rehab    Staff Present Heath Lark, RN, BSN, CCRP;Joseph [place], RCP,RRT,BSRT;Amanda Miranda, [place], ACSM CEP, Exercise Physiologist    Virtual Visit No    Medication changes reported     No    Fall or balance concerns reported    No    Warm-up and Cool-down Performed on first and last piece of equipment    Resistance Training Performed Yes    VAD Patient? No    PAD/SET Patient? No      Pain Assessment   Currently in Pain? No/denies                Social History   Tobacco Use  Smoking Status Never  Smokeless Tobacco Never    Goals Met:  Independence with exercise equipment Exercise tolerated well No report of cardiac concerns or symptoms  Goals Unmet:  Not Applicable  Comments: Pt able to follow exercise prescription today without complaint.  Will continue to monitor for progression.    Dr. Emily Filbert is Medical Director for [place].  Dr. Ottie Glazier is Medical Director for [facility] Pulmonary Rehabilitation.

## 2021-04-19 ENCOUNTER — Encounter: Payer: Medicare Other | Attending: Internal Medicine | Admitting: *Deleted

## 2021-04-19 ENCOUNTER — Other Ambulatory Visit: Payer: Self-pay

## 2021-04-19 DIAGNOSIS — I214 Non-ST elevation (NSTEMI) myocardial infarction: Secondary | ICD-10-CM | POA: Insufficient documentation

## 2021-04-19 DIAGNOSIS — Z955 Presence of coronary angioplasty implant and graft: Secondary | ICD-10-CM | POA: Diagnosis present

## 2021-04-19 NOTE — Progress Notes (Signed)
Daily Session Note  Patient Details  Name: Phillip [name] MRN: [id number] Date of Birth: 1946-03-10 Referring Provider:   Flowsheet Row Cardiac Rehab from 01/20/2021 in [facility] Cardiac and Pulmonary Rehab  Referring Provider [name]       Encounter Date: 04/19/2021  Check In:  Session Check In - 04/19/21 0836       Check-In   Supervising physician immediately available to respond to emergencies See telemetry face sheet for immediately available ER MD    Location [facility]-Cardiac & Pulmonary Rehab    Staff Present Heath Lark, RN, BSN, Laveda Norman, BS, ACSM CEP, Exercise Physiologist;Joseph [place], [place]    Virtual Visit No    Medication changes reported     No    Fall or balance concerns reported    No    Warm-up and Cool-down Performed on first and last piece of equipment    Resistance Training Performed Yes    VAD Patient? No    PAD/SET Patient? No      Pain Assessment   Currently in Pain? No/denies                Social History   Tobacco Use  Smoking Status Never  Smokeless Tobacco Never    Goals Met:  Independence with exercise equipment Exercise tolerated well No report of cardiac concerns or symptoms  Goals Unmet:  Not Applicable  Comments: Pt able to follow exercise prescription today without complaint.  Will continue to monitor for progression.    Dr. Emily Filbert is Medical Director for [place].  Dr. Ottie Glazier is Medical Director for [facility] Pulmonary Rehabilitation.

## 2021-04-21 ENCOUNTER — Other Ambulatory Visit: Payer: Self-pay

## 2021-04-21 DIAGNOSIS — I214 Non-ST elevation (NSTEMI) myocardial infarction: Secondary | ICD-10-CM

## 2021-04-21 DIAGNOSIS — Z955 Presence of coronary angioplasty implant and graft: Secondary | ICD-10-CM | POA: Diagnosis not present

## 2021-04-21 NOTE — Patient Instructions (Signed)
Discharge Patient Instructions  Patient Details  Name: Phillip [name] MRN: [id number] Date of Birth: 1945/10/06 Referring Provider:  Yolonda Kida, MD   Number of Visits: 108  Reason for Discharge:  Patient reached a stable level of exercise. Patient independent in their exercise. Patient has met program and personal goals.  Smoking History:  Social History   Tobacco Use  Smoking Status Never  Smokeless Tobacco Never    Diagnosis:  No diagnosis found.  Initial Exercise Prescription:  Initial Exercise Prescription - 01/20/21 1300       Date of Initial Exercise RX and Referring Provider   Date 01/20/21    Referring Provider [name]      Treadmill   MPH 2.6    Grade 1    Minutes 15    METs 3.35      Recumbant Bike   Level 2    RPM 60    Watts 20    Minutes 15    METs 2.7      NuStep   Level 3    SPM 80    Minutes 15    METs 2.7      T5 Nustep   Level 2    SPM 80    Minutes 15    METs 2.7      Track   Laps 35    Minutes 15    METs 2.9      Prescription Details   Frequency (times per week) 2    Duration Progress to 30 minutes of continuous aerobic without signs/symptoms of physical distress      Intensity   THRR 40-80% of Max Heartrate 97-129    Ratings of Perceived Exertion 11-13    Perceived Dyspnea 0-4      Progression   Progression Continue to progress workloads to maintain intensity without signs/symptoms of physical distress.      Resistance Training   Training Prescription Yes    Weight 5 lb    Reps 10-15             Discharge Exercise Prescription (Final Exercise Prescription Changes):  Exercise Prescription Changes - 04/12/21 1400       Response to Exercise   Blood Pressure (Admit) 126/64    Blood Pressure (Exercise) 136/76    Blood Pressure (Exit) 124/60    Heart Rate (Admit) 61 bpm    Heart Rate (Exercise) 96 bpm    Heart Rate (Exit) 69 bpm    Oxygen Saturation (Admit) 95 %    Oxygen Saturation  (Exercise) 97 %    Oxygen Saturation (Exit) 98 %    Rating of Perceived Exertion (Exercise) 14    Symptoms none    Duration Continue with 30 min of aerobic exercise without signs/symptoms of physical distress.    Intensity THRR unchanged      Progression   Progression Continue to progress workloads to maintain intensity without signs/symptoms of physical distress.    Average METs 5.15      Resistance Training   Training Prescription Yes    Weight 7 lb    Reps 10-15      Treadmill   MPH 3.6    Grade 5    Minutes 15    METs 6.24      NuStep   Level 7    Minutes 15    METs 4.1             Functional Capacity:  6 Minute Walk  [place] Name 01/20/21 1242 04/09/21 0826       6 Minute Walk   Phase Initial Discharge    Distance 1360 feet 1705 feet    Distance % Change -- 25.4 %    Distance Feet Change -- 345 ft    Walk Time 6 minutes 6 minutes    # of Rest Breaks 0 0    MPH 2.57 3.23    METS 2.79 3.53    RPE 9 13    Perceived Dyspnea  0 0    VO2 Peak 9.78 12.34    Symptoms No No    Resting HR 65 bpm 61 bpm    Resting BP 118/68 126/64    Resting Oxygen Saturation  96 % 98 %    Exercise Oxygen Saturation  during 6 min walk 96 % 95 %    Max Ex. HR 75 bpm 89 bpm    Max Ex. BP 128/66 136/76    2 Minute Post BP 114/64 --                 Nutrition & Weight - Outcomes:  Pre Biometrics - 01/20/21 1241       Pre Biometrics   Height 5' 9.5" (1.765 m)    Weight 164 lb 11.2 oz (74.7 kg)    BMI (Calculated) 23.98    Single Leg Stand 30 seconds             Post Biometrics - 04/09/21 0827        Post  Biometrics   Height 5' 9.5" (1.765 m)    Weight 169 lb 14.4 oz (77.1 kg)    BMI (Calculated) 24.74    Single Leg Stand 12 seconds             Nutrition:  Nutrition Therapy & Goals - 02/08/21 0721       Nutrition Therapy   Diet Heart healthy, low Na    Drug/Food Interactions Coumadin/Vit K;Statins/Certain Fruits    Protein (specify units)  60g    Fiber 30 grams    Whole Grain Foods 3 servings    Saturated Fats 12 max. grams    Fruits and Vegetables 8 servings/day    Sodium 1.5 grams      Personal Nutrition Goals   Nutrition Goal ST:LT: Yvone Neu would not like to make any changes at this time.    Comments B: oatmeal L: sandwich ([place] on whole wheat - most of the time) D: meat (chicken or salmon patties - will had red meat 1-2x/week) and a couple of vegetables (beans, leafy greens (not as often due to warfarin), potatoes, salads. He will use canola oil and butter. they use mrs. dash. He reports using almond milk and limits dairy. He also reports drinking sugar free lemonaide and decaf coffee with no sugar creamer during the day as well as water - not as much as he woiuld like. Discussed heart healthy eating and vitamin K content of foods - provided handouts. Patient would not like to make any changes at this time.      Intervention Plan   Intervention Prescribe, educate and counsel regarding individualized specific dietary modifications aiming towards targeted core components such as weight, hypertension, lipid management, diabetes, heart failure and other comorbidities.;Nutrition handout(s) given to patient.    Expected Outcomes Short Term Goal: Understand basic principles of dietary content, such as calories, fat, sodium, cholesterol and nutrients.;Short Term Goal: A plan has been developed with personal nutrition goals set during  dietitian appointment.;Long Term Goal: Adherence to prescribed nutrition plan.             Goals reviewed with patient; copy given to patient.

## 2021-04-21 NOTE — Progress Notes (Signed)
Discharge Progress Report  Patient Details  Name: Phillip [name] MRN: [id number] Date of Birth: October 03, 1945 Referring Provider:   Flowsheet Row Cardiac Rehab from 01/20/2021 in [facility] Cardiac and Pulmonary Rehab  Referring Provider [name]        Number of Visits: 75  Reason for Discharge:  Patient reached a stable level of exercise. Patient independent in their exercise. Patient has met program and personal goals.  Smoking History:  Social History   Tobacco Use  Smoking Status Never  Smokeless Tobacco Never    Diagnosis:  NSTEMI (non-ST elevated myocardial infarction) ([place])  Status post coronary artery stent placement  ADL UCSD:   Initial Exercise Prescription:  Initial Exercise Prescription - 01/20/21 1300       Date of Initial Exercise RX and Referring Provider   Date 01/20/21    Referring Provider [name]      Treadmill   MPH 2.6    Grade 1    Minutes 15    METs 3.35      Recumbant Bike   Level 2    RPM 60    Watts 20    Minutes 15    METs 2.7      NuStep   Level 3    SPM 80    Minutes 15    METs 2.7      T5 Nustep   Level 2    SPM 80    Minutes 15    METs 2.7      Track   Laps 35    Minutes 15    METs 2.9      Prescription Details   Frequency (times per week) 2    Duration Progress to 30 minutes of continuous aerobic without signs/symptoms of physical distress      Intensity   THRR 40-80% of Max Heartrate 97-129    Ratings of Perceived Exertion 11-13    Perceived Dyspnea 0-4      Progression   Progression Continue to progress workloads to maintain intensity without signs/symptoms of physical distress.      Resistance Training   Training Prescription Yes    Weight 5 lb    Reps 10-15             Discharge Exercise Prescription (Final Exercise Prescription Changes):  Exercise Prescription Changes - 04/12/21 1400       Response to Exercise   Blood Pressure (Admit) 126/64    Blood Pressure (Exercise)  136/76    Blood Pressure (Exit) 124/60    Heart Rate (Admit) 61 bpm    Heart Rate (Exercise) 96 bpm    Heart Rate (Exit) 69 bpm    Oxygen Saturation (Admit) 95 %    Oxygen Saturation (Exercise) 97 %    Oxygen Saturation (Exit) 98 %    Rating of Perceived Exertion (Exercise) 14    Symptoms none    Duration Continue with 30 min of aerobic exercise without signs/symptoms of physical distress.    Intensity THRR unchanged      Progression   Progression Continue to progress workloads to maintain intensity without signs/symptoms of physical distress.    Average METs 5.15      Resistance Training   Training Prescription Yes    Weight 7 lb    Reps 10-15      Treadmill   MPH 3.6    Grade 5    Minutes 15    METs 6.24      NuStep  Level 7    Minutes 15    METs 4.1             Functional Capacity:  6 Minute Walk     [place] Name 01/20/21 1242 04/09/21 0826       6 Minute Walk   Phase Initial Discharge    Distance 1360 feet 1705 feet    Distance % Change -- 25.4 %    Distance Feet Change -- 345 ft    Walk Time 6 minutes 6 minutes    # of Rest Breaks 0 0    MPH 2.57 3.23    METS 2.79 3.53    RPE 9 13    Perceived Dyspnea  0 0    VO2 Peak 9.78 12.34    Symptoms No No    Resting HR 65 bpm 61 bpm    Resting BP 118/68 126/64    Resting Oxygen Saturation  96 % 98 %    Exercise Oxygen Saturation  during 6 min walk 96 % 95 %    Max Ex. HR 75 bpm 89 bpm    Max Ex. BP 128/66 136/76    2 Minute Post BP 114/64 --             Psychological, QOL, Others - Outcomes: PHQ 2/9: Depression screen [facility] 2/9 04/21/2021 01/20/2021  Decreased Interest 0 0  Down, Depressed, Hopeless 0 0  PHQ - 2 Score 0 0  Altered sleeping 1 2  Tired, decreased energy 1 0  Change in appetite 2 0  Feeling bad or failure about yourself  0 0  Trouble concentrating 0 1  Moving slowly or fidgety/restless 0 0  Suicidal thoughts 0 0  PHQ-9 Score 4 3  Difficult doing work/chores Not difficult at all  Not difficult at all    Quality of Life:  Quality of Life - 04/21/21 0816       Quality of Life   Select Quality of Life      Quality of Life Scores   Health/Function Pre 25.8 %    Health/Function Post 25.73 %    Health/Function % Change -0.27 %    Socioeconomic Pre 29.64 %    Socioeconomic Post 25.44 %    Socioeconomic % Change  -14.17 %    Psych/Spiritual Pre 28.07 %    Psych/Spiritual Post 26.64 %    Psych/Spiritual % Change -5.09 %    Family Pre 24.6 %    Family Post 24.3 %    Family % Change -1.22 %    GLOBAL Pre 26.88 %    GLOBAL Post 25.64 %    GLOBAL % Change -4.61 %                Nutrition & Weight - Outcomes:  Pre Biometrics - 01/20/21 1241       Pre Biometrics   Height 5' 9.5" (1.765 m)    Weight 164 lb 11.2 oz (74.7 kg)    BMI (Calculated) 23.98    Single Leg Stand 30 seconds             Post Biometrics - 04/09/21 0827        Post  Biometrics   Height 5' 9.5" (1.765 m)    Weight 169 lb 14.4 oz (77.1 kg)    BMI (Calculated) 24.74    Single Leg Stand 12 seconds             Nutrition:  Nutrition Therapy & Goals -  02/08/21 0721       Nutrition Therapy   Diet Heart healthy, low Na    Drug/Food Interactions Coumadin/Vit K;Statins/Certain Fruits    Protein (specify units) 60g    Fiber 30 grams    Whole Grain Foods 3 servings    Saturated Fats 12 max. grams    Fruits and Vegetables 8 servings/day    Sodium 1.5 grams      Personal Nutrition Goals   Nutrition Goal ST:LT: Yvone Neu would not like to make any changes at this time.    Comments B: oatmeal L: sandwich ([place] on whole wheat - most of the time) D: meat (chicken or salmon patties - will had red meat 1-2x/week) and a couple of vegetables (beans, leafy greens (not as often due to warfarin), potatoes, salads. He will use canola oil and butter. they use mrs. dash. He reports using almond milk and limits dairy. He also reports drinking sugar free lemonaide and decaf coffee with no  sugar creamer during the day as well as water - not as much as he woiuld like. Discussed heart healthy eating and vitamin K content of foods - provided handouts. Patient would not like to make any changes at this time.      Intervention Plan   Intervention Prescribe, educate and counsel regarding individualized specific dietary modifications aiming towards targeted core components such as weight, hypertension, lipid management, diabetes, heart failure and other comorbidities.;Nutrition handout(s) given to patient.    Expected Outcomes Short Term Goal: Understand basic principles of dietary content, such as calories, fat, sodium, cholesterol and nutrients.;Short Term Goal: A plan has been developed with personal nutrition goals set during dietitian appointment.;Long Term Goal: Adherence to prescribed nutrition plan.             Nutrition Discharge:   Education Questionnaire Score:  Knowledge Questionnaire Score - 04/21/21 0819       Knowledge Questionnaire Score   Pre Score 24/26: Angina, Exercise    Post Score 23/26             Goals reviewed with patient; copy given to patient.

## 2021-04-21 NOTE — Progress Notes (Signed)
Cardiac Individual Treatment Plan  Patient Details  Name: Phillip [name] MRN: [id number] Date of Birth: 1945/12/27 Referring Provider:   Flowsheet Row Cardiac Rehab from 01/20/2021 in [facility] Cardiac and Pulmonary Rehab  Referring Provider [name]       Initial Encounter Date:  Flowsheet Row Cardiac Rehab from 01/20/2021 in [facility] Cardiac and Pulmonary Rehab  Date 01/20/21       Visit Diagnosis: NSTEMI (non-ST elevated myocardial infarction) [facility])  Status post coronary artery stent placement  Patient's Home Medications on Admission:  Current Outpatient Medications:    acetaminophen (TYLENOL) 500 MG tablet, Take 500 mg by mouth every 6 (six) hours as needed., Disp: , Rfl:    amLODipine (NORVASC) 5 MG tablet, Take 5 mg by mouth daily., Disp: , Rfl:    aspirin 81 MG EC tablet, Take 1 tablet (81 mg total) by mouth daily. Swallow whole., Disp: 30 tablet, Rfl: 0   atorvastatin (LIPITOR) 80 MG tablet, Take 1 tablet (80 mg total) by mouth daily., Disp: 90 tablet, Rfl: 3   cholecalciferol (VITAMIN D3) 25 MCG (1000 UT) tablet, Take 1,000 Units by mouth daily. (Patient not taking: Reported on 01/18/2021), Disp: , Rfl:    Chromium 1 MG CAPS, Take 1 mg by mouth daily. 1 per day, Disp: , Rfl:    clopidogrel (PLAVIX) 75 MG tablet, Take 1 tablet (75 mg total) by mouth daily with breakfast., Disp: 90 tablet, Rfl: 3   enoxaparin (LOVENOX) 80 MG/0.8ML injection, Inject 0.8 mLs (80 mg total) into the skin every 12 (twelve) hours. (Patient not taking: Reported on 01/18/2021), Disp: 3.2 mL, Rfl: 0   Glucosamine-Chondroit-Vit C-Mn (GLUCOSAMINE CHONDR 1500 COMPLX PO), Take 1 tablet by mouth every evening., Disp: , Rfl:    isosorbide mononitrate (IMDUR) 30 MG 24 hr tablet, Take 1 tablet (30 mg total) by mouth daily., Disp: 90 tablet, Rfl: 3   levothyroxine (SYNTHROID) 50 MCG tablet, Take 50 mcg by mouth. (Patient not taking: Reported on 01/18/2021), Disp: , Rfl:    loratadine (CLARITIN) 10 MG tablet, Take 10  mg by mouth in the morning and at bedtime., Disp: , Rfl:    metoprolol tartrate (LOPRESSOR) 25 MG tablet, Take 0.5 tablets (12.5 mg total) by mouth 2 (two) times daily., Disp: 60 tablet, Rfl: 0   Multiple Vitamin (MULTIVITAMIN) tablet, Take 1 tablet by mouth daily., Disp: , Rfl:    niacin 500 MG tablet, Take 500 mg by mouth at bedtime. (Patient not taking: Reported on 01/18/2021), Disp: , Rfl:    nitroGLYCERIN (NITROSTAT) 0.4 MG SL tablet, Place 1 tablet (0.4 mg total) under the tongue every 5 (five) minutes x 3 doses as needed for chest pain., Disp: 25 tablet, Rfl: 3   pantoprazole (PROTONIX) 40 MG tablet, Take 1 tablet (40 mg total) by mouth daily., Disp: 90 tablet, Rfl: 3   potassium phosphate, monobasic, (K-PHOS ORIGINAL) 500 MG tablet, Take 500 mg by mouth daily., Disp: , Rfl:    TURMERIC PO, Take 1 tablet by mouth daily., Disp: , Rfl:    warfarin (COUMADIN) 4 MG tablet, Take 0.5-1 tablets (2-4 mg total) by mouth See admin instructions. Take 72m daily by mouth with the exception of 221mdaily by mouth on Mondays and Thursdays. Take 4 mg 01/07/21, then resume regular schedule., Disp: , Rfl:   Past Medical History: Past Medical History:  Diagnosis Date   Arthritis    Basal cell carcinoma    L clavicle, L prox forearm- removed years ago  Bladder cancer [facility])    Bladder neck contracture    CAD (coronary artery disease)    a. 12/2020 NSTEMI/Cath: LM nl, LAD 95ost/p, LCX 50p, RCA nl. EF 45-50%.   Cataract    CKD (chronic kidney disease), stage III [facility])    ED (erectile dysfunction)    Frequency    GERD (gastroesophageal reflux disease)    History of pulmonary embolus (PE) 11/2018   a. Following LE DVT-->Chronic warfarin.   Hyperlipidemia LDL goal <70    Hypertension    Hypothyroidism    Incontinence of urine    sui, s/p cryoablation   Ischemic cardiomyopathy    a. 11/2018 Echo: EF 60-65%; b. 12/2020 LV Gram: EF 45-50% in setting of NSTEMI.   Kidney stones    Neuropathy    feet    Nocturia    Prostate cancer (HCC)    S/P   CRYOABLATION   Right elbow tendinitis    Right Lower Extremity DVT (deep venous thrombosis) (HCC) 11/25/2018   Vertigo    1-2x/yr   Wears glasses     Tobacco Use: Social History   Tobacco Use  Smoking Status Never  Smokeless Tobacco Never    Labs: Recent Review Flowsheet Data     Labs for ITP Cardiac and Pulmonary Rehab Latest Ref Rng & Units 12/06/2013 12/14/2015 01/15/2020 12/24/2020   Cholestrol 0 - 200 mg/dL - - - 004   LDLCALC 0 - 99 mg/dL - - - 849(C)   HDL >65 mg/dL - - - 16(U)   Trlycerides <150 mg/dL - - - 610   TCO2 22 - 32 mmol/L 24 23 27  -        Exercise Target Goals: Exercise Program Goal: Individual exercise prescription set using results from initial 6 min walk test and THRR while considering  patient's activity barriers and safety.   Exercise Prescription Goal: Initial exercise prescription builds to 30-45 minutes a day of aerobic activity, 2-3 days per week.  Home exercise guidelines will be given to patient during program as part of exercise prescription that the participant will acknowledge.   Education: Aerobic Exercise: - Group verbal and visual presentation on the components of exercise prescription. Introduces F.I.T.T principle from ACSM for exercise prescriptions.  Reviews F.I.T.T. principles of aerobic exercise including progression. Written material given at graduation. Flowsheet Row Cardiac Rehab from 04/14/2021 in [facility] Cardiac and Pulmonary Rehab  Date 03/10/21  Educator [facility]  Instruction Review Code 1- Verbalizes Understanding       Education: Resistance Exercise: - Group verbal and visual presentation on the components of exercise prescription. Introduces F.I.T.T principle from ACSM for exercise prescriptions  Reviews F.I.T.T. principles of resistance exercise including progression. Written material given at graduation.    Education: Exercise & Equipment Safety: - Individual verbal instruction and  demonstration of equipment use and safety with use of the equipment. Flowsheet Row Cardiac Rehab from 04/14/2021 in [facility] Cardiac and Pulmonary Rehab  Education need identified 01/20/21  Date 01/20/21  Educator [place]  Instruction Review Code 1- Verbalizes Understanding       Education: Exercise Physiology & General Exercise Guidelines: - Group verbal and written instruction with models to review the exercise physiology of the cardiovascular system and associated critical values. Provides general exercise guidelines with specific guidelines to those with heart or lung disease.    Education: Flexibility, Balance, Mind/Body Relaxation: - Group verbal and visual presentation with interactive activity on the components of exercise prescription. Introduces F.I.T.T principle from ACSM for exercise prescriptions.  Reviews F.I.T.T. principles of flexibility and balance exercise training including progression. Also discusses the mind body connection.  Reviews various relaxation techniques to help reduce and manage stress (i.e. Deep breathing, progressive muscle relaxation, and visualization). Balance handout provided to take home. Written material given at graduation. Flowsheet Row Cardiac Rehab from 04/14/2021 in [facility] Cardiac and Pulmonary Rehab  Date 03/24/21  Educator [facility]  Instruction Review Code 1- Verbalizes Understanding       Activity Barriers & Risk Stratification:  Activity Barriers & Cardiac Risk Stratification - 01/20/21 1241       Activity Barriers & Cardiac Risk Stratification   Activity Barriers Other (comment)    Comments Neuropathy- feet    Cardiac Risk Stratification High             6 Minute Walk:  6 Minute Walk     Row Name 01/20/21 1242 04/09/21 0826       6 Minute Walk   Phase Initial Discharge    Distance 1360 feet 1705 feet    Distance % Change -- 25.4 %    Distance Feet Change -- 345 ft    Walk Time 6 minutes 6 minutes    # of Rest Breaks 0 0    MPH 2.57 3.23     METS 2.79 3.53    RPE 9 13    Perceived Dyspnea  0 0    VO2 Peak 9.78 12.34    Symptoms No No    Resting HR 65 bpm 61 bpm    Resting BP 118/68 126/64    Resting Oxygen Saturation  96 % 98 %    Exercise Oxygen Saturation  during 6 min walk 96 % 95 %    Max Ex. HR 75 bpm 89 bpm    Max Ex. BP 128/66 136/76    2 Minute Post BP 114/64 --             Oxygen Initial Assessment:   Oxygen Re-Evaluation:   Oxygen Discharge (Final Oxygen Re-Evaluation):   Initial Exercise Prescription:  Initial Exercise Prescription - 01/20/21 1300       Date of Initial Exercise RX and Referring Provider   Date 01/20/21    Referring Provider [name]      Treadmill   MPH 2.6    Grade 1    Minutes 15    METs 3.35      Recumbant Bike   Level 2    RPM 60    Watts 20    Minutes 15    METs 2.7      NuStep   Level 3    SPM 80    Minutes 15    METs 2.7      T5 Nustep   Level 2    SPM 80    Minutes 15    METs 2.7      Track   Laps 35    Minutes 15    METs 2.9      Prescription Details   Frequency (times per week) 2    Duration Progress to 30 minutes of continuous aerobic without signs/symptoms of physical distress      Intensity   THRR 40-80% of Max Heartrate 97-129    Ratings of Perceived Exertion 11-13    Perceived Dyspnea 0-4      Progression   Progression Continue to progress workloads to maintain intensity without signs/symptoms of physical distress.      Horticulturist, commercial Prescription  Yes    Weight 5 lb    Reps 10-15             Perform Capillary Blood Glucose checks as needed.  Exercise Prescription Changes:   Exercise Prescription Changes     Row Name 01/20/21 1300 02/02/21 1400 02/12/21 0800 02/16/21 1300 03/01/21 1200     Response to Exercise   Blood Pressure (Admit) 118/68 130/80 -- 124/72 128/64   Blood Pressure (Exercise) 128/66 142/58 -- 130/64 152/64   Blood Pressure (Exit) 114/64 130/70 -- 104/66 108/60   Heart  Rate (Admit) 65 bpm 61 bpm -- 67 bpm 71 bpm   Heart Rate (Exercise) 76 bpm 86 bpm -- 78 bpm 102 bpm   Heart Rate (Exit) 60 bpm 70 bpm -- 59 bpm 73 bpm   Oxygen Saturation (Admit) 96 % -- -- -- --   Oxygen Saturation (Exercise) 96 % -- -- -- --   Oxygen Saturation (Exit) 96 % -- -- -- --   Rating of Perceived Exertion (Exercise) 9 11 -- 13 14   Perceived Dyspnea (Exercise) 0 -- -- -- --   Symptoms none none -- none none   Comments walk test results -- -- -- --   Duration -- Continue with 30 min of aerobic exercise without signs/symptoms of physical distress. -- Continue with 30 min of aerobic exercise without signs/symptoms of physical distress. Continue with 30 min of aerobic exercise without signs/symptoms of physical distress.   Intensity -- THRR unchanged -- THRR unchanged THRR unchanged     Progression   Progression -- Continue to progress workloads to maintain intensity without signs/symptoms of physical distress. -- Continue to progress workloads to maintain intensity without signs/symptoms of physical distress. Continue to progress workloads to maintain intensity without signs/symptoms of physical distress.   Average METs -- 3.04 -- 2.4 4.53     Resistance Training   Training Prescription -- Yes -- Yes Yes   Weight -- 5 lb -- 5 lb 7 lb   Reps -- 10-15 -- 10-15 10-15     Interval Training   Interval Training -- No -- No No     Treadmill   MPH -- 2.6 -- -- 3.6   Grade -- 2 -- -- 4.5   Minutes -- 15 -- -- 15   METs -- 3.71 -- -- 5.99     Recumbant Bike   Level -- 4 -- 4 4   RPM -- -- -- -- 50   Watts -- -- -- -- 34   Minutes -- 15 -- 15 15   METs -- -- -- 2.85 2.83     NuStep   Level -- 5 -- -- 5   SPM -- -- -- -- 80   Minutes -- 15 -- -- 15   METs -- 3.4 -- -- 3.5     T5 Nustep   Level -- 3 -- 3 --   Minutes -- 15 -- 15 --   METs -- 2 -- 2 --     Home Exercise Plan   Plans to continue exercise at -- -- [employer] (comment)  [facility]  (comment)  [facility] (comment)  [employer]   Frequency -- -- Add 2 additional days to program exercise sessions. Add 2 additional days to program exercise sessions. Add 2 additional days to program exercise sessions.   Initial Home Exercises Provided -- -- 02/12/21 02/12/21 02/12/21    Row Name 03/16/21 0900 03/29/21 1600 04/12/21 1400  Response to Exercise   Blood Pressure (Admit) 128/70 102/60 126/64     Blood Pressure (Exercise) -- -- 136/76     Blood Pressure (Exit) 110/62 100/60 124/60     Heart Rate (Admit) 65 bpm 63 bpm 61 bpm     Heart Rate (Exercise) 74 bpm -- 96 bpm     Heart Rate (Exit) 67 bpm 82 bpm 69 bpm     Oxygen Saturation (Admit) -- 96 % 95 %     Oxygen Saturation (Exercise) -- 98 % 97 %     Oxygen Saturation (Exit) -- 96 % 98 %     Rating of Perceived Exertion (Exercise) 13 13 14      Symptoms none none none     Duration Continue with 30 min of aerobic exercise without signs/symptoms of physical distress. Continue with 30 min of aerobic exercise without signs/symptoms of physical distress. Continue with 30 min of aerobic exercise without signs/symptoms of physical distress.     Intensity THRR unchanged THRR unchanged THRR unchanged           Progression       Progression Continue to progress workloads to maintain intensity without signs/symptoms of physical distress. Continue to progress workloads to maintain intensity without signs/symptoms of physical distress. Continue to progress workloads to maintain intensity without signs/symptoms of physical distress.     Average METs 2.5 3.45 5.15           Resistance Training       Training Prescription Yes Yes Yes     Weight 7 lb 7 lb 7 lb     Reps 10-15 10-15 10-15           Interval Training       Interval Training No No --           Treadmill       MPH -- 3.2 3.6     Grade -- 3 5     Minutes -- 15 15     METs -- 4.77 6.24           Recumbant Bike       Level -- 6 --     Watts 32 25 --      Minutes 15 15 --     METs 2.83 3.05 --           NuStep       Level -- 7 7     Minutes -- 15 15     METs -- 3.8 4.1           T5 Nustep       Level 5 6 --     Minutes 15 15 --     METs 2.1 2.2 --           Home Exercise Plan       Plans to continue exercise at [employer] (comment)  [place] (comment)  [employer] --     Frequency Add 2 additional days to program exercise sessions. Add 2 additional days to program exercise sessions. --     Initial Home Exercises Provided 02/12/21 02/12/21 --             Exercise Comments:   Exercise Comments     Row Name 01/22/21 0175 04/21/21 0821         Exercise Comments First full day of exercise!  Patient was oriented to gym and equipment including functions, settings, policies, and procedures.  Patient's individual  exercise prescription and treatment plan were reviewed.  All starting workloads were established based on the results of the 6 minute walk test done at initial orientation visit.  The plan for exercise progression was also introduced and progression will be customized based on patient's performance and goals. Markanthony graduated today from  rehab with 36 sessions completed.  Details of the patient's exercise prescription and what He needs to do in order to continue the prescription and progress were discussed with patient.  Patient was given a copy of prescription and goals.  Patient verbalized understanding.  Cotey plans to continue to exercise by returning to the [facility].               Exercise Goals and Review:   Exercise Goals     Row Name 01/20/21 1304             Exercise Goals   Increase Physical Activity Yes       Intervention Provide advice, education, support and counseling about physical activity/exercise needs.;Develop an individualized exercise prescription for aerobic and resistive training based on initial evaluation findings, risk stratification, comorbidities and participant's  personal goals.       Expected Outcomes Short Term: Attend rehab on a regular basis to increase amount of physical activity.;Long Term: Add in home exercise to make exercise part of routine and to increase amount of physical activity.;Long Term: Exercising regularly at least 3-5 days a week.       Increase Strength and Stamina Yes       Intervention Provide advice, education, support and counseling about physical activity/exercise needs.;Develop an individualized exercise prescription for aerobic and resistive training based on initial evaluation findings, risk stratification, comorbidities and participant's personal goals.       Expected Outcomes Short Term: Increase workloads from initial exercise prescription for resistance, speed, and METs.;Short Term: Perform resistance training exercises routinely during rehab and add in resistance training at home;Long Term: Improve cardiorespiratory fitness, muscular endurance and strength as measured by increased METs and functional capacity (6MWT)       Able to understand and use rate of perceived exertion (RPE) scale Yes       Intervention Provide education and explanation on how to use RPE scale       Expected Outcomes Short Term: Able to use RPE daily in rehab to express subjective intensity level;Long Term:  Able to use RPE to guide intensity level when exercising independently       Able to understand and use Dyspnea scale Yes       Intervention Provide education and explanation on how to use Dyspnea scale       Expected Outcomes Short Term: Able to use Dyspnea scale daily in rehab to express subjective sense of shortness of breath during exertion;Long Term: Able to use Dyspnea scale to guide intensity level when exercising independently       Knowledge and understanding of Target Heart Rate Range (THRR) Yes       Intervention Provide education and explanation of THRR including how the numbers were predicted and where they are located for reference        Expected Outcomes Short Term: Able to state/look up THRR;Short Term: Able to use daily as guideline for intensity in rehab;Long Term: Able to use THRR to govern intensity when exercising independently       Able to check pulse independently Yes       Intervention Provide education and demonstration on how to check pulse in  carotid and radial arteries.;Review the importance of being able to check your own pulse for safety during independent exercise       Expected Outcomes Short Term: Able to explain why pulse checking is important during independent exercise;Long Term: Able to check pulse independently and accurately       Understanding of Exercise Prescription Yes       Intervention Provide education, explanation, and written materials on patient's individual exercise prescription       Expected Outcomes Short Term: Able to explain program exercise prescription;Long Term: Able to explain home exercise prescription to exercise independently                Exercise Goals Re-Evaluation :  Exercise Goals Re-Evaluation     Row Name 01/22/21 [phone] 02/02/21 1448 02/12/21 0838 02/16/21 1336 02/19/21 0817     Exercise Goal Re-Evaluation   Exercise Goals Review Able to understand and use rate of perceived exertion (RPE) scale;Able to understand and use Dyspnea scale;Knowledge and understanding of Target Heart Rate Range (THRR);Understanding of Exercise Prescription Increase Physical Activity;Increase Strength and Stamina;Understanding of Exercise Prescription Increase Physical Activity;Increase Strength and Stamina;Understanding of Exercise Prescription Increase Physical Activity;Increase Strength and Stamina Increase Physical Activity;Increase Strength and Stamina   Comments Reviewed RPE and dyspnea scales, THR and program prescription with pt today.  Pt voiced understanding and was given a copy of goals to take home. Yvone Neu is off to a good start. He has even added in his third day!!  He is up to level 5  on NuStep and 2% grade on treadmill.  We will continue to monitor his progress. Reviewed home exercise with pt today.  Pt plans to go the [facility] for exercise. Really encouraged aerobic exericse outside of rehab to help maintain his endurance and continue resistance training as well. Patient plans to buy a pulse ox to check his HR. Reviewed THR, pulse, RPE, sign and symptoms, pulse oximetery and when to call 911 or MD.  Also discussed weather considerations and indoor options.  Pt voiced understanding. Yvone Neu attends consistently and works at [employer].  He has increased workloads on machines.  Staff will monitor progress. [facility] and [facility]. [facility]. [facility]   Expected Outcomes Short: Use RPE daily to regulate intensity. Long: Follow program prescription in THR. Short: Continue to attend regularly Long: Continue to improve stamina. Short: Check HR during exercise Long: Exercise at home at appropriate exercise prescription Short:  coninue to attend consistently Long:  build overall stamina Short: continue to improve in [facility], Long: maintain exercise independently.    [place] Name 03/01/21 1237 03/16/21 0910 03/29/21 1610 04/12/21 0819       Exercise Goal Re-Evaluation   Exercise Goals Review Increase Physical Activity;Increase Strength and Stamina Increase Physical Activity;Increase Strength and Stamina Increase Physical Activity;Increase Strength and Stamina;Understanding of Exercise Prescription Increase Physical Activity;Increase Strength and Stamina    Comments Yvone Neu is doing great. He has increased his resistance training to 7 lb and increased his treadmill speed/incline on treadmill to 3.6/4.5%. Will continue to monitor. Yvone Neu continues to do well. He has played golf a couple times after sessions. Yvone Neu has been doing well in rehab.  He is up to level 6 on the  bike and T5 NuStep.  We will continue to monitor  his progress Yvone Neu improved post 6MWT.  [facility]    Expected Outcomes Short: Continue to increase loads on machines Long: Continue to improve overall MET level Short: continue to attend consistently Long:  improve MET level Short:Move treadmill back up again Long; Continue to improve stamina Short: complete HT Long:  maintain exercise long term             Discharge Exercise Prescription (Final Exercise Prescription Changes):  Exercise Prescription Changes - 04/12/21 1400       Response to Exercise   Blood Pressure (Admit) 126/64    Blood Pressure (Exercise) 136/76    Blood Pressure (Exit) 124/60    Heart Rate (Admit) 61 bpm    Heart Rate (Exercise) 96 bpm    Heart Rate (Exit) 69 bpm    Oxygen Saturation (Admit) 95 %    Oxygen Saturation (Exercise) 97 %    Oxygen Saturation (Exit) 98 %    Rating of Perceived Exertion (Exercise) 14    Symptoms none    Duration Continue with 30 min of aerobic exercise without signs/symptoms of physical distress.    Intensity THRR unchanged      Progression   Progression Continue to progress workloads to maintain intensity without signs/symptoms of physical distress.    Average METs 5.15      Resistance Training   Training Prescription Yes    Weight 7 lb    Reps 10-15      Treadmill   MPH 3.6    Grade 5    Minutes 15    METs 6.24      NuStep   Level 7    Minutes 15    METs 4.1             Nutrition:  Target Goals: Understanding of nutrition guidelines, daily intake of sodium '1500mg'$ , cholesterol '200mg'$ , calories 30% from fat and 7% or less from saturated fats, daily to have 5 or more servings of fruits and vegetables.  Education: All About Nutrition: -Group instruction provided by verbal, written material, interactive activities, discussions, models, and posters to present general guidelines for heart healthy nutrition including fat, fiber, MyPlate,  the role of sodium in heart healthy nutrition, utilization of the nutrition label, and utilization of this knowledge for meal planning. Follow up email sent as well. Written material given at graduation.   Biometrics:  Pre Biometrics - 01/20/21 1241       Pre Biometrics   Height 5' 9.5" (1.765 m)    Weight 164 lb 11.2 oz (74.7 kg)    BMI (Calculated) 23.98    Single Leg Stand 30 seconds             Post Biometrics - 04/09/21 0827        Post  Biometrics   Height 5' 9.5" (1.765 m)    Weight 169 lb 14.4 oz (77.1 kg)    BMI (Calculated) 24.74    Single Leg Stand 12 seconds             Nutrition Therapy Plan and Nutrition Goals:  Nutrition Therapy & Goals - 02/08/21 0721       Nutrition Therapy   Diet Heart healthy, low Na    Drug/Food Interactions Coumadin/Vit K;Statins/Certain Fruits    Protein (specify units) 60g    Fiber 30 grams    Whole Grain Foods 3 servings    Saturated Fats 12 max. grams    Fruits and Vegetables  8 servings/day    Sodium 1.5 grams      Personal Nutrition Goals   Nutrition Goal ST:LT: Yvone Neu would not like to make any changes at this time.    Comments B: oatmeal L: sandwich ([place] on whole wheat - most of the time) D: meat (chicken or salmon patties - will had red meat 1-2x/week) and a couple of vegetables (beans, leafy greens (not as often due to warfarin), potatoes, salads. He will use canola oil and butter. they use mrs. dash. He reports using almond milk and limits dairy. He also reports drinking sugar free lemonaide and decaf coffee with no sugar creamer during the day as well as water - not as much as he woiuld like. Discussed heart healthy eating and vitamin K content of foods - provided handouts. Patient would not like to make any changes at this time.      Intervention Plan   Intervention Prescribe, educate and counsel regarding individualized specific dietary modifications aiming towards targeted core components such as weight,  hypertension, lipid management, diabetes, heart failure and other comorbidities.;Nutrition handout(s) given to patient.    Expected Outcomes Short Term Goal: Understand basic principles of dietary content, such as calories, fat, sodium, cholesterol and nutrients.;Short Term Goal: A plan has been developed with personal nutrition goals set during dietitian appointment.;Long Term Goal: Adherence to prescribed nutrition plan.             Nutrition Assessments:  MEDIFICTS Score Key: ?70 Need to make dietary changes  40-70 Heart Healthy Diet ? 40 Therapeutic Level Cholesterol Diet  Flowsheet Row Cardiac Rehab from 04/21/2021 in [facility] Cardiac and Pulmonary Rehab  Picture Your Plate Total Score on Admission 61  Picture Your Plate Total Score on Discharge 52      Picture Your Plate Scores: <56 Unhealthy dietary pattern with much room for improvement. 41-50 Dietary pattern unlikely to meet recommendations for good health and room for improvement. 51-60 More healthful dietary pattern, with some room for improvement.  >60 Healthy dietary pattern, although there may be some specific behaviors that could be improved.    Nutrition Goals Re-Evaluation:  Nutrition Goals Re-Evaluation     [place] Name 02/19/21 0823 03/19/21 0812 04/12/21 9794         Goals   Current Weight 167 lb (75.8 kg) 166 lb (75.3 kg) --     Nutrition Goal Maintain weight. Watch sugars. Maintain portion control --     Comment Yvone Neu has been trying to eat less pork and has changed some of his eating habits. He states that he could eat some more vegatables. Yvone Neu has been watching his cholesterol and states is cholesterol was the only thing that was a little higher. His doctor has prescribed him crestor. Yvone Neu has been trying to maintain weight.  He does watch red meat and eat more salad and watch salt.     Expected Outcome Short: maintain current weight. Long: stay on a diet that pertains to his health. Short: maintain a lower  cholesterol diet. Long: take medications and keep cholesterol low. Short: continue to eat heart healthy Long:  maintain weight              Nutrition Goals Discharge (Final Nutrition Goals Re-Evaluation):  Nutrition Goals Re-Evaluation - 04/12/21 0822       Goals   Comment Yvone Neu has been trying to maintain weight.  He does watch red meat and eat more salad and watch salt.    Expected Outcome Short: continue to  eat heart healthy Long:  maintain weight             Psychosocial: Target Goals: Acknowledge presence or absence of significant depression and/or stress, maximize coping skills, provide positive support system. Participant is able to verbalize types and ability to use techniques and skills needed for reducing stress and depression.   Education: Stress, Anxiety, and Depression - Group verbal and visual presentation to define topics covered.  Reviews how body is impacted by stress, anxiety, and depression.  Also discusses healthy ways to reduce stress and to treat/manage anxiety and depression.  Written material given at graduation.   Education: Sleep Hygiene -Provides group verbal and written instruction about how sleep can affect your health.  Define sleep hygiene, discuss sleep cycles and impact of sleep habits. Review good sleep hygiene tips.    Initial Review & Psychosocial Screening:  Initial Psych Review & Screening - 01/18/21 1111       Initial Review   Current issues with None Identified      Family Dynamics   Good Support System? Yes      Barriers   Psychosocial barriers to participate in program There are no identifiable barriers or psychosocial needs.      Screening Interventions   Interventions Encouraged to exercise;To provide support and resources with identified psychosocial needs;Provide feedback about the scores to participant    Expected Outcomes Short Term goal: Utilizing psychosocial counselor, staff and physician to assist with identification of  specific Stressors or current issues interfering with healing process. Setting desired goal for each stressor or current issue identified.;Long Term Goal: Stressors or current issues are controlled or eliminated.;Short Term goal: Identification and review with participant of any Quality of Life or Depression concerns found by scoring the questionnaire.;Long Term goal: The participant improves quality of Life and PHQ9 Scores as seen by post scores and/or verbalization of changes             Quality of Life Scores:   Quality of Life - 04/21/21 0816       Quality of Life   Select Quality of Life      Quality of Life Scores   Health/Function Pre 25.8 %    Health/Function Post 25.73 %    Health/Function % Change -0.27 %    Socioeconomic Pre 29.64 %    Socioeconomic Post 25.44 %    Socioeconomic % Change  -14.17 %    Psych/Spiritual Pre 28.07 %    Psych/Spiritual Post 26.64 %    Psych/Spiritual % Change -5.09 %    Family Pre 24.6 %    Family Post 24.3 %    Family % Change -1.22 %    GLOBAL Pre 26.88 %    GLOBAL Post 25.64 %    GLOBAL % Change -4.61 %            Scores of 19 and below usually indicate a poorer quality of life in these areas.  A difference of  2-3 points is a clinically meaningful difference.  A difference of 2-3 points in the total score of the Quality of Life Index has been associated with significant improvement in overall quality of life, self-image, physical symptoms, and general health in studies assessing change in quality of life.  PHQ-9: Recent Review Flowsheet Data     Depression screen [facility] 2/9 04/21/2021 01/20/2021   Decreased Interest 0 0   Down, Depressed, Hopeless 0 0   PHQ - 2 Score 0 0   Altered  sleeping 1 2   Tired, decreased energy 1 0   Change in appetite 2 0   Feeling bad or failure about yourself  0 0   Trouble concentrating 0 1   Moving slowly or fidgety/restless 0 0   Suicidal thoughts 0 0   PHQ-9 Score 4 3   Difficult doing  work/chores Not difficult at all Not difficult at all      Interpretation of Total Score  Total Score Depression Severity:  1-4 = Minimal depression, 5-9 = Mild depression, 10-14 = Moderate depression, 15-19 = Moderately severe depression, 20-27 = Severe depression   Psychosocial Evaluation and Intervention:  Psychosocial Evaluation - 01/18/21 1119       Psychosocial Evaluation & Interventions   Interventions Encouraged to exercise with the program and follow exercise prescription    Comments 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 They are unsure why it went away, he said one doctor thinnks maybe the vasodilators and blood thinners. He was seeing a clinic for months trying to figure it all out, but he is taking it day by day and is hopeful it stays away. He sleeps well, enjoys golf, and doesn't report any major stressors. He wants to keep being active and is hopeful this program accompanies his lifestyle well.    Expected Outcomes Short: attend cardiac rehab for education and exercise. Long: develop positive self care habits.    Continue Psychosocial Services  Follow up required by staff             Psychosocial Re-Evaluation:  Psychosocial Re-Evaluation     [place] Name 02/19/21 0825 03/19/21 0810 04/12/21 0820         Psychosocial Re-Evaluation   Current issues with None Identified None Identified None Identified     Comments Patient reports no issues with their current mental states, sleep, stress, depression or anxiety. Will follow up with patient in a few weeks for any changes. Yvone Neu has been playing gold alot recently. He has been doing well in the program and has no issues with his mental health. Golf is Kens way of relaxing and he plays golf a couple times a week. Yvone Neu will complete HT soon. He plans to continue golfing     Expected Outcomes Short: Continue to exercise  regularly to support mental health and notify staff of any changes. Long: maintain mental health and well being through teaching of rehab or prescribed medications independently. Short: Continue to exercise regularly to support mental health and notify staff of any changes. Long: maintain mental health and well being through teaching of rehab or prescribed medications independently. Short: Continue to exercise regularly to support mental health and notify staff of any changes. Long: maintain mental health and well being through teaching of rehab or prescribed medications independently.     Interventions Encouraged to attend Cardiac Rehabilitation for the exercise Encouraged to attend Cardiac Rehabilitation for the exercise --     Continue Psychosocial Services  Follow up required by staff Follow up required by staff --              Psychosocial Discharge (Final Psychosocial Re-Evaluation):  Psychosocial Re-Evaluation - 04/12/21 0820       Psychosocial Re-Evaluation   Current issues with None Identified    Comments Yvone Neu will complete HT soon. He plans to continue golfing    Expected Outcomes Short: Continue to exercise  regularly to support mental health and notify staff of any changes. Long: maintain mental health and well being through teaching of rehab or prescribed medications independently.             Vocational Rehabilitation: Provide vocational rehab assistance to qualifying candidates.   Vocational Rehab Evaluation & Intervention:  Vocational Rehab - 01/18/21 1110       Initial Vocational Rehab Evaluation & Intervention   Assessment shows need for Vocational Rehabilitation No             Education: Education Goals: Education classes will be provided on a variety of topics geared toward better understanding of heart health and risk factor modification. Participant will state understanding/return demonstration of topics presented as noted by education test  scores.  Learning Barriers/Preferences:  Learning Barriers/Preferences - 01/18/21 1109       Learning Barriers/Preferences   Learning Barriers None    Learning Preferences None             General Cardiac Education Topics:  AED/CPR: - Group verbal and written instruction with the use of models to demonstrate the basic use of the AED with the basic ABC's of resuscitation.   Anatomy and Cardiac Procedures: - Group verbal and visual presentation and models provide information about basic cardiac anatomy and function. Reviews the testing methods done to diagnose heart disease and the outcomes of the test results. Describes the treatment choices: Medical Management, Angioplasty, or Coronary Bypass Surgery for treating various heart conditions including Myocardial Infarction, Angina, Valve Disease, and Cardiac Arrhythmias.  Written material given at graduation.   Medication Safety: - Group verbal and visual instruction to review commonly prescribed medications for heart and lung disease. Reviews the medication, class of the drug, and side effects. Includes the steps to properly store meds and maintain the prescription regimen.  Written material given at graduation. Flowsheet Row Cardiac Rehab from 04/14/2021 in [facility] Cardiac and Pulmonary Rehab  Date 02/03/21  Educator SB  Instruction Review Code 1- Verbalizes Understanding       Intimacy: - Group verbal instruction through game format to discuss how heart and lung disease can affect sexual intimacy. Written material given at graduation.. Flowsheet Row Cardiac Rehab from 04/14/2021 in [facility] Cardiac and Pulmonary Rehab  Date 03/10/21  Educator [facility]  Instruction Review Code 1- Verbalizes Understanding       Know Your Numbers and Heart Failure: - Group verbal and visual instruction to discuss disease risk factors for cardiac and pulmonary disease and treatment options.  Reviews associated critical values for Overweight/Obesity,  Hypertension, Cholesterol, and Diabetes.  Discusses basics of heart failure: signs/symptoms and treatments.  Introduces Heart Failure Zone chart for action plan for heart failure.  Written material given at graduation. Flowsheet Row Cardiac Rehab from 04/14/2021 in [facility] Cardiac and Pulmonary Rehab  Date 04/14/21  Educator SB  Instruction Review Code 1- Verbalizes Understanding       Infection Prevention: - Provides verbal and written material to individual with discussion of infection control including proper hand washing and proper equipment cleaning during exercise session. Flowsheet Row Cardiac Rehab from 04/14/2021 in [facility] Cardiac and Pulmonary Rehab  Education need identified 01/20/21  Date 01/20/21  Educator [place]  Instruction Review Code 1- Verbalizes Understanding       Falls Prevention: - Provides verbal and written material to individual with discussion of falls prevention and safety. Flowsheet Row Cardiac Rehab from 04/14/2021 in [facility] Cardiac and Pulmonary Rehab  Education need identified 01/20/21  Date 01/20/21  Educator [place]  Instruction Review Code 1- Verbalizes Understanding       Other: -Provides group and verbal instruction on various topics (see comments)   Knowledge Questionnaire Score:  Knowledge Questionnaire Score - 04/21/21 0819       Knowledge Questionnaire Score   Pre Score 24/26: Angina, Exercise    Post Score 23/26             Core Components/Risk Factors/Patient Goals at Admission:  Personal Goals and Risk Factors at Admission - 01/20/21 1304       Core Components/Risk Factors/Patient Goals on Admission    Weight Management Yes;Weight Maintenance    Intervention Weight Management: Develop a combined nutrition and exercise program designed to reach desired caloric intake, while maintaining appropriate intake of nutrient and fiber, sodium and fats, and appropriate energy expenditure required for the weight goal.;Weight Management: Provide  education and appropriate resources to help participant work on and attain dietary goals.;Weight Management/Obesity: Establish reasonable short term and long term weight goals.    Admit Weight 164 lb (74.4 kg)    Goal Weight: Short Term 164 lb (74.4 kg)    Goal Weight: Long Term 164 lb (74.4 kg)    Expected Outcomes Short Term: Continue to assess and modify interventions until short term weight is achieved;Long Term: Adherence to nutrition and physical activity/exercise program aimed toward attainment of established weight goal;Weight Maintenance: Understanding of the daily nutrition guidelines, which includes 25-35% calories from fat, 7% or less cal from saturated fats, less than $RemoveB'200mg'kothZbRg$  cholesterol, less than 1.5gm of sodium, & 5 or more servings of fruits and vegetables daily;Understanding recommendations for meals to include 15-35% energy as protein, 25-35% energy from fat, 35-60% energy from carbohydrates, less than $RemoveB'200mg'FrWMvrZR$  of dietary cholesterol, 20-35 gm of total fiber daily;Understanding of distribution of calorie intake throughout the day with the consumption of 4-5 meals/snacks    Hypertension Yes    Intervention Provide education on lifestyle modifcations including regular physical activity/exercise, weight management, moderate sodium restriction and increased consumption of fresh fruit, vegetables, and low fat dairy, alcohol moderation, and smoking cessation.;Monitor prescription use compliance.    Expected Outcomes Short Term: Continued assessment and intervention until BP is < 140/66mm HG in hypertensive participants. < 130/80mm HG in hypertensive participants with diabetes, heart failure or chronic kidney disease.;Long Term: Maintenance of blood pressure at goal levels.    Lipids Yes    Intervention Provide education and support for participant on nutrition & aerobic/resistive exercise along with prescribed medications to achieve LDL '70mg'$ , HDL >$Remo'40mg'hJjpE$ .    Expected Outcomes Short Term: Participant  states understanding of desired cholesterol values and is compliant with medications prescribed. Participant is following exercise prescription and nutrition guidelines.;Long Term: Cholesterol controlled with medications as prescribed, with individualized exercise RX and with personalized nutrition plan. Value goals: LDL < $Rem'70mg'dxSm$ , HDL > 40 mg.             Education:Diabetes - Individual verbal and written instruction to review signs/symptoms of diabetes, desired ranges of glucose level fasting, after meals and with exercise. Acknowledge that pre and post exercise glucose checks will be done for 3 sessions at entry of program.   Core Components/Risk Factors/Patient Goals Review:   Goals and Risk Factor Review     [place] Name 02/19/21 0820 03/19/21 0806 04/12/21 0817         Core Components/Risk Factors/Patient Goals Review   Personal Goals Review Weight Management/Obesity;Hypertension Hypertension Other     Review Yvone Neu wants to maintain his weight  which he is doing. His blood pressure has been good in the program but does not check it at home. He knows where to get one and will look into it. His blood pressure has been good in the program but does not check it at home. Informed him that he should check it at home so he knows his numbers. He said he has no means to check it but informed him where to purchase one. Kens favorite part of the program was the strength exercise and stretching.  His least favorite was the education as he had a hard time hearing due to masks.     Expected Outcomes Short: obtain a blood pressure cuff. Long:maintain blood pressure readings  at home. Short: obtain a blood pressure cuff. Long:maintain blood pressure readings  at home. Short: complete program Long: maintain exercise and heart healthy lifestyle              Core Components/Risk Factors/Patient Goals at Discharge (Final Review):   Goals and Risk Factor Review - 04/12/21 0817       Core Components/Risk  Factors/Patient Goals Review   Personal Goals Review Other    Review Kens favorite part of the program was the strength exercise and stretching.  His least favorite was the education as he had a hard time hearing due to masks.    Expected Outcomes Short: complete program Long: maintain exercise and heart healthy lifestyle             ITP Comments:  ITP Comments     Row Name 01/18/21 1122 01/20/21 1237 01/22/21 0843 02/03/21 0605 03/03/21 0629   ITP Comments Initial telephone orienation completed. Diagnosis can be found in [facility] 4/19. EP orientation scheduled for Wednesday 5/4 at 11am. Completed 6MWT and gym orientation. Initial ITP created and sent for review to Dr. Emily Filbert, Medical Director. First full day of exercise!  Patient was oriented to gym and equipment including functions, settings, policies, and procedures.  Patient's individual exercise prescription and treatment plan were reviewed.  All starting workloads were established based on the results of the 6 minute walk test done at initial orientation visit.  The plan for exercise progression was also introduced and progression will be customized based on patient's performance and goals. 30 Day review completed. Medical Director ITP review done, changes made as directed, and signed approval by Medical Director.  New to program 30 Day review completed. Medical Director ITP review done, changes made as directed, and signed approval by Medical Director.    [place] Name 03/31/21 0554 04/21/21 0821         ITP Comments 30 Day review completed. Medical Director ITP review done, changes made as directed, and signed approval by Medical Director. Discharge ITP sent and signed by Dr. Sabra Heck.  Discharge Summary routed to cardiologist.               Comments: Discharge ITP

## 2021-04-21 NOTE — Progress Notes (Signed)
Daily Session Note  Patient Details  Name: Phillip [name] MRN: [id number] Date of Birth: 08-12-1946 Referring Provider:   Flowsheet Row Cardiac Rehab from 01/20/2021 in [facility] Cardiac and Pulmonary Rehab  Referring Provider Phillip [name]       Encounter Date: 04/21/2021  Check In:  Session Check In - 04/21/21 0820       Check-In   Supervising physician immediately available to respond to emergencies See telemetry face sheet for immediately available ER MD    Location [facility]-Cardiac & Pulmonary Rehab    Staff Present Hope Budds, RDN, LDN;Kristen Coble, RN,BC,MSN;Amanda Oletta Darter, BA, ACSM CEP, Exercise Physiologist    Virtual Visit No    Medication changes reported     No    Fall or balance concerns reported    No    Warm-up and Cool-down Performed on first and last piece of equipment    Resistance Training Performed Yes    VAD Patient? No    PAD/SET Patient? No      Pain Assessment   Currently in Pain? No/denies                Social History   Tobacco Use  Smoking Status Never  Smokeless Tobacco Never    Goals Met:  Proper associated with RPD/PD & O2 Sat Independence with exercise equipment Exercise tolerated well Personal goals reviewed No report of cardiac concerns or symptoms Strength training completed today  Goals Unmet:  Not Applicable  Comments:  Phillip [name] graduated today from  rehab with 36 sessions completed.  Details of the patient's exercise prescription and what He needs to do in order to continue the prescription and progress were discussed with patient.  Patient was given a copy of prescription and goals.  Patient verbalized understanding.  Phillip [name] plans to continue to exercise by returning to the [facility].    Dr. Emily Filbert is Medical Director for [name].  Dr. Ottie Glazier is Medical Director for [facility] Pulmonary Rehabilitation.

## 2021-04-21 NOTE — Progress Notes (Deleted)
Daily Session Note  Patient Details  Name: Phillip [name] MRN: [id number] Date of Birth: 1946-04-16 Referring Provider:   Flowsheet Row Cardiac Rehab from 01/20/2021 in [facility]  Referring Provider [name]       Encounter Date: 04/21/2021  Check In:  Session Check In - 04/21/21 0820       Check-In   Supervising physician immediately available to respond to emergencies See telemetry face sheet for immediately available ER MD    Location [facility]-Cardiac & Pulmonary Rehab    Staff Present Hope Budds, RDN, LDN;Faustina Gebert, RN,BC,MSN;Amanda Oletta Darter, BA, ACSM CEP, Exercise Physiologist    Virtual Visit No    Medication changes reported     No    Fall or balance concerns reported    No    Warm-up and Cool-down Performed on first and last piece of equipment    Resistance Training Performed Yes    VAD Patient? No    PAD/SET Patient? No      Pain Assessment   Currently in Pain? No/denies                Social History   Tobacco Use  Smoking Status Never  Smokeless Tobacco Never    Goals Met:  Independence with exercise equipment Exercise tolerated well No report of cardiac concerns or symptoms  Goals Unmet:  Not Applicable  Comments: Pt able to follow exercise prescription today without complaint.  Will continue to monitor for progression.    Dr. Emily Filbert is Medical Director for [facility].  Dr. Ottie Glazier is Medical Director for [facility] Pulmonary Rehabilitation.

## 2021-07-07 ENCOUNTER — Other Ambulatory Visit: Payer: Self-pay

## 2021-07-07 ENCOUNTER — Encounter: Payer: Self-pay | Admitting: Dermatology

## 2021-07-07 ENCOUNTER — Ambulatory Visit: Payer: Medicare Other | Admitting: Dermatology

## 2021-07-07 DIAGNOSIS — Z85828 Personal history of other malignant neoplasm of skin: Secondary | ICD-10-CM

## 2021-07-07 DIAGNOSIS — L820 Inflamed seborrheic keratosis: Secondary | ICD-10-CM | POA: Diagnosis not present

## 2021-07-07 DIAGNOSIS — D229 Melanocytic nevi, unspecified: Secondary | ICD-10-CM

## 2021-07-07 DIAGNOSIS — L570 Actinic keratosis: Secondary | ICD-10-CM | POA: Diagnosis not present

## 2021-07-07 DIAGNOSIS — L821 Other seborrheic keratosis: Secondary | ICD-10-CM

## 2021-07-07 DIAGNOSIS — L814 Other melanin hyperpigmentation: Secondary | ICD-10-CM

## 2021-07-07 DIAGNOSIS — D1801 Hemangioma of skin and subcutaneous tissue: Secondary | ICD-10-CM

## 2021-07-07 DIAGNOSIS — D692 Other nonthrombocytopenic purpura: Secondary | ICD-10-CM | POA: Diagnosis not present

## 2021-07-07 DIAGNOSIS — L578 Other skin changes due to chronic exposure to nonionizing radiation: Secondary | ICD-10-CM

## 2021-07-07 DIAGNOSIS — Z1283 Encounter for screening for malignant neoplasm of skin: Secondary | ICD-10-CM

## 2021-07-07 NOTE — Progress Notes (Signed)
Follow-Up Visit   Subjective  Phillip [name] is a 75 y.o. male who presents for the following: Annual Exam (History of BCC - TBSE today). The patient presents for Total-Body Skin Exam (TBSE) for skin cancer screening and mole check.  The following portions of the chart were reviewed this encounter and updated as appropriate:   Tobacco  Allergies  Meds  Problems  Med Hx  Surg Hx  Fam Hx     Review of Systems:  No other skin or systemic complaints except as noted in HPI or Assessment and Plan.  Objective  Well appearing patient in no apparent distress; mood and affect are within normal limits.  A full examination was performed including scalp, head, eyes, ears, nose, lips, neck, chest, axillae, abdomen, back, buttocks, bilateral upper extremities, bilateral lower extremities, hands, feet, fingers, toes, fingernails, and toenails. All findings within normal limits unless otherwise noted below.  Left face x 3 (3) Erythematous thin papules/macules with gritty scale.   Left face x 6, right face x 11 - Total 17 (17) Erythematous keratotic or waxy stuck-on papule or plaque.    Assessment & Plan   History of Basal Cell Carcinoma of the Skin - No evidence of recurrence today - Recommend regular full body skin exams - Recommend daily broad spectrum sunscreen SPF 30+ to sun-exposed areas, reapply every 2 hours as needed.  - Call if any new or changing lesions are noted between office visits  Purpura - Chronic; persistent and recurrent.  Treatable, but not curable. - Violaceous macules and patches - Benign - Related to trauma, age, sun damage and/or use of blood thinners, chronic use of topical and/or oral steroids - Observe - Can use OTC arnica containing moisturizer such as Dermend Bruise Formula if desired - Call for worsening or other concerns  Lentigines - Scattered tan macules - Due to sun exposure - Benign-appearing, observe - Recommend daily broad spectrum sunscreen  SPF 30+ to sun-exposed areas, reapply every 2 hours as needed. - Call for any changes  Seborrheic Keratoses - Stuck-on, waxy, tan-brown papules and/or plaques  - Benign-appearing - Discussed benign etiology and prognosis. - Observe - Call for any changes  Melanocytic Nevi - Tan-brown and/or pink-flesh-colored symmetric macules and papules - Benign appearing on exam today - Observation - Call clinic for new or changing moles - Recommend daily use of broad spectrum spf 30+ sunscreen to sun-exposed areas.   Hemangiomas - Red papules - Discussed benign nature - Observe - Call for any changes  Actinic Damage - Chronic condition, secondary to cumulative UV/sun exposure - diffuse scaly erythematous macules with underlying dyspigmentation - Recommend daily broad spectrum sunscreen SPF 30+ to sun-exposed areas, reapply every 2 hours as needed.  - Staying in the shade or wearing long sleeves, sun glasses (UVA+UVB protection) and wide brim hats (4-inch brim around the entire circumference of the hat) are also recommended for sun protection.  - Call for new or changing lesions.  Skin cancer screening performed today.  AK (actinic keratosis) (3) Left face x 3  Destruction of lesion - Left face x 3 Complexity: simple   Destruction method: cryotherapy   Informed consent: discussed and consent obtained   Timeout:  patient name, date of birth, surgical site, and procedure verified Lesion destroyed using liquid nitrogen: Yes   Region frozen until ice ball extended beyond lesion: Yes   Outcome: patient tolerated procedure well with no complications   Post-procedure details: wound care instructions given  Inflamed seborrheic keratosis Left face x 6, right face x 11 - Total 17  Destruction of lesion - Left face x 6, right face x 11 - Total 17 Complexity: simple   Destruction method: cryotherapy   Informed consent: discussed and consent obtained   Timeout:  patient name, date of  birth, surgical site, and procedure verified Lesion destroyed using liquid nitrogen: Yes   Region frozen until ice ball extended beyond lesion: Yes   Outcome: patient tolerated procedure well with no complications   Post-procedure details: wound care instructions given    Skin cancer screening  Return in about 1 year (around 07/07/2022) for TBSE.  I, Ashok Cordia, CMA, am acting as scribe for Sarina Ser, MD . Documentation: I have reviewed the above documentation for accuracy and completeness, and I agree with the above.  Sarina Ser, MD

## 2021-07-07 NOTE — Patient Instructions (Signed)

## 2021-07-12 ENCOUNTER — Other Ambulatory Visit: Payer: Self-pay

## 2021-07-12 ENCOUNTER — Other Ambulatory Visit (HOSPITAL_COMMUNITY): Payer: Self-pay

## 2021-08-17 ENCOUNTER — Ambulatory Visit: Payer: Medicare Other | Admitting: Podiatry

## 2021-10-22 DIAGNOSIS — Z86711 Personal history of pulmonary embolism: Secondary | ICD-10-CM | POA: Diagnosis not present

## 2021-10-22 DIAGNOSIS — Z1331 Encounter for screening for depression: Secondary | ICD-10-CM | POA: Diagnosis not present

## 2021-10-22 DIAGNOSIS — G620 Drug-induced polyneuropathy: Secondary | ICD-10-CM | POA: Diagnosis not present

## 2021-10-22 DIAGNOSIS — T451X5A Adverse effect of antineoplastic and immunosuppressive drugs, initial encounter: Secondary | ICD-10-CM | POA: Diagnosis not present

## 2021-10-22 DIAGNOSIS — C61 Malignant neoplasm of prostate: Secondary | ICD-10-CM | POA: Diagnosis not present

## 2021-10-22 DIAGNOSIS — D6859 Other primary thrombophilia: Secondary | ICD-10-CM | POA: Diagnosis not present

## 2021-11-04 DIAGNOSIS — U071 COVID-19: Secondary | ICD-10-CM | POA: Diagnosis not present

## 2021-11-04 DIAGNOSIS — B3781 Candidal esophagitis: Secondary | ICD-10-CM | POA: Diagnosis not present

## 2021-11-04 DIAGNOSIS — E119 Type 2 diabetes mellitus without complications: Secondary | ICD-10-CM | POA: Diagnosis not present

## 2021-11-10 DIAGNOSIS — E782 Mixed hyperlipidemia: Secondary | ICD-10-CM | POA: Diagnosis not present

## 2021-11-10 DIAGNOSIS — D6859 Other primary thrombophilia: Secondary | ICD-10-CM | POA: Diagnosis not present

## 2021-11-10 DIAGNOSIS — E119 Type 2 diabetes mellitus without complications: Secondary | ICD-10-CM | POA: Diagnosis not present

## 2021-11-10 DIAGNOSIS — B3781 Candidal esophagitis: Secondary | ICD-10-CM | POA: Diagnosis not present

## 2021-11-10 DIAGNOSIS — E1169 Type 2 diabetes mellitus with other specified complication: Secondary | ICD-10-CM | POA: Diagnosis not present

## 2021-11-17 DIAGNOSIS — Z0000 Encounter for general adult medical examination without abnormal findings: Secondary | ICD-10-CM | POA: Diagnosis not present

## 2021-11-17 DIAGNOSIS — D6859 Other primary thrombophilia: Secondary | ICD-10-CM | POA: Diagnosis not present

## 2021-11-17 DIAGNOSIS — Z125 Encounter for screening for malignant neoplasm of prostate: Secondary | ICD-10-CM | POA: Diagnosis not present

## 2021-11-17 DIAGNOSIS — E119 Type 2 diabetes mellitus without complications: Secondary | ICD-10-CM | POA: Diagnosis not present

## 2021-11-17 DIAGNOSIS — B3781 Candidal esophagitis: Secondary | ICD-10-CM | POA: Diagnosis not present

## 2021-11-21 ENCOUNTER — Emergency Department
Admission: EM | Admit: 2021-11-21 | Discharge: 2021-11-21 | Disposition: A | Discharge status: Home / Self Care | Payer: Medicare HMO | Attending: Emergency Medicine | Admitting: Emergency Medicine

## 2021-11-21 ENCOUNTER — Emergency Department: Payer: Medicare HMO

## 2021-11-21 ENCOUNTER — Encounter: Payer: Self-pay | Admitting: Emergency Medicine

## 2021-11-21 ENCOUNTER — Other Ambulatory Visit: Payer: Self-pay

## 2021-11-21 DIAGNOSIS — E1122 Type 2 diabetes mellitus with diabetic chronic kidney disease: Secondary | ICD-10-CM | POA: Insufficient documentation

## 2021-11-21 DIAGNOSIS — I82441 Acute embolism and thrombosis of right tibial vein: Secondary | ICD-10-CM | POA: Diagnosis not present

## 2021-11-21 DIAGNOSIS — Z8551 Personal history of malignant neoplasm of bladder: Secondary | ICD-10-CM | POA: Insufficient documentation

## 2021-11-21 DIAGNOSIS — N189 Chronic kidney disease, unspecified: Secondary | ICD-10-CM | POA: Diagnosis not present

## 2021-11-21 DIAGNOSIS — Z7901 Long term (current) use of anticoagulants: Secondary | ICD-10-CM | POA: Diagnosis not present

## 2021-11-21 DIAGNOSIS — I129 Hypertensive chronic kidney disease with stage 1 through stage 4 chronic kidney disease, or unspecified chronic kidney disease: Secondary | ICD-10-CM | POA: Diagnosis not present

## 2021-11-21 DIAGNOSIS — Z8546 Personal history of malignant neoplasm of prostate: Secondary | ICD-10-CM | POA: Insufficient documentation

## 2021-11-21 DIAGNOSIS — E039 Hypothyroidism, unspecified: Secondary | ICD-10-CM | POA: Diagnosis not present

## 2021-11-21 DIAGNOSIS — M79604 Pain in right leg: Secondary | ICD-10-CM | POA: Diagnosis present

## 2021-11-21 DIAGNOSIS — I82811 Embolism and thrombosis of superficial veins of right lower extremities: Secondary | ICD-10-CM | POA: Diagnosis not present

## 2021-11-21 DIAGNOSIS — I80231 Phlebitis and thrombophlebitis of right tibial vein: Secondary | ICD-10-CM

## 2021-11-21 LAB — CBC WITH DIFFERENTIAL/PLATELET
Abs Immature Granulocytes: 0.09 10*3/uL — ABNORMAL HIGH (ref 0.00–0.07)
Basophils Absolute: 0.1 10*3/uL (ref 0.0–0.1)
Basophils Relative: 1 %
Eosinophils Absolute: 0.4 10*3/uL (ref 0.0–0.5)
Eosinophils Relative: 4 %
HCT: 50.5 % (ref 39.0–52.0)
Hemoglobin: 15.7 g/dL (ref 13.0–17.0)
Immature Granulocytes: 1 %
Lymphocytes Relative: 18 %
Lymphs Abs: 1.8 10*3/uL (ref 0.7–4.0)
MCH: 26.7 pg (ref 26.0–34.0)
MCHC: 31.1 g/dL (ref 30.0–36.0)
MCV: 85.9 fL (ref 80.0–100.0)
Monocytes Absolute: 0.7 10*3/uL (ref 0.1–1.0)
Monocytes Relative: 7 %
Neutro Abs: 6.7 10*3/uL (ref 1.7–7.7)
Neutrophils Relative %: 69 %
Platelets: 289 10*3/uL (ref 150–400)
RBC: 5.88 MIL/uL — ABNORMAL HIGH (ref 4.22–5.81)
RDW: 20.5 % — ABNORMAL HIGH (ref 11.5–15.5)
WBC: 9.7 10*3/uL (ref 4.0–10.5)
nRBC: 0 % (ref 0.0–0.2)

## 2021-11-21 LAB — COMPREHENSIVE METABOLIC PANEL
ALT: 22 U/L (ref 0–44)
AST: 16 U/L (ref 15–41)
Albumin: 3.3 g/dL — ABNORMAL LOW (ref 3.5–5.0)
Alkaline Phosphatase: 37 U/L — ABNORMAL LOW (ref 38–126)
Anion gap: 9 (ref 5–15)
BUN: 16 mg/dL (ref 8–23)
CO2: 23 mmol/L (ref 22–32)
Calcium: 8.7 mg/dL — ABNORMAL LOW (ref 8.9–10.3)
Chloride: 105 mmol/L (ref 98–111)
Creatinine, Ser: 1.22 mg/dL (ref 0.61–1.24)
GFR, Estimated: >60 mL/min (ref >60)
Glucose, Bld: 290 mg/dL — ABNORMAL HIGH (ref 70–99)
Potassium: 4.2 mmol/L (ref 3.5–5.1)
Sodium: 137 mmol/L (ref 135–145)
Total Bilirubin: 0.7 mg/dL (ref 0.3–1.2)
Total Protein: 6 g/dL — ABNORMAL LOW (ref 6.5–8.1)

## 2021-11-21 LAB — PROTIME-INR
INR: 1.4 — ABNORMAL HIGH (ref 0.8–1.2)
Prothrombin Time: 16.8 seconds — ABNORMAL HIGH (ref 11.4–15.2)

## 2021-11-21 MED ORDER — ENOXAPARIN SODIUM 80 MG/0.8ML IJ SOSY: 1.0000 mg/kg | PREFILLED_SYRINGE | Freq: Two times a day (BID) | INTRAMUSCULAR | 0 refills | Status: DC

## 2021-11-21 NOTE — ED Notes (Signed)
See triage note  presents with pain to posterior right calf   also noticed some swelling  states he was off his blood thinners for 1 week per MD's orders  ?

## 2021-11-21 NOTE — Discharge Instructions (Signed)
Call Dr. Ammie Ferrier office tomorrow for follow-up.  Your ultrasound does show a thrombosis in the posterior tibial vein.  Elevate and use warm moist compresses to the area.  Avoid rubbing your leg.  Continue the Coumadin and your regular medications.  The Lovenox that you are familiar with was sent to the pharmacy at [facility] on [address]. ?

## 2021-11-21 NOTE — ED Provider Notes (Signed)
? ?[facility] ?Provider Note ? ? ? Event Date/Time  ? First MD Initiated Contact with Patient 11/21/21 [phone]   ?  (approximate) ? ? ?History  ? ?Leg Pain ? ? ?HPI ? ?Phillip [name] is a 76 y.o. male   presents to the ED with complaint of right leg swelling and pain.  Patient has a history of DVTs and states that he believes that this is a blood clot in his leg.  Patient was taking warfarin and had lab work done.  He was advised by his PCP to stop his medication for 1 week and then lab work was done which he was told just recently that he could start back on his warfarin.  Patient denies any injury to his leg.  Patient has a history of DVT, PE, chronic kidney disease, GERD, hypertension, hypothyroidism, diabetes type 2, prostate and bladder cancer, nephrolithiasis.  Currently rates his pain as 6 out of 10. ? ?  ? ? ?Physical Exam  ? ?Triage Vital Signs: ?ED Triage Vitals  ?Enc Vitals Group  ?   BP 11/21/21 0918 (!) 142/77  ?   Pulse Rate 11/21/21 0918 73  ?   Resp 11/21/21 0918 18  ?   Temp 11/21/21 0918 98.1 ?F (36.7 ?C)  ?   Temp Source 11/21/21 0918 Oral  ?   SpO2 11/21/21 0918 97 %  ?   Weight 11/21/21 0915 170 lb (77.1 kg)  ?   Height 11/21/21 0915 '5\' 9"'$  (1.753 m)  ?   Head Circumference --   ?   Peak Flow --   ?   Pain Score 11/21/21 0915 6  ?   Pain Loc --   ?   Pain Edu? --   ?   Excl. in [place]? --   ? ? ?Most recent vital signs: ?Vitals:  ? 11/21/21 1059 11/21/21 1201  ?BP: 128/66 139/79  ?Pulse: 78 72  ?Resp: 18 18  ?Temp:    ?SpO2: 98% 98%  ? ? ? ?General: Awake, no distress.  Alert and talkative. ?CV:  Good peripheral perfusion.  Heart regular rate and rhythm. ?Resp:  Normal effort.  Lungs are clear bilaterally. ?Abd:  No distention.  ?Other:  On examination of the right lower extremity there is no gross deformity and no obvious pitting edema present.  There is tenderness on palpation of the posterior calf and some warmth is noted but no erythema.  Skin is intact.  DP and TP were  noted and also confirmed by Doppler. ? ? ?ED Results / Procedures / Treatments  ? ?Labs ?(all labs ordered are listed, but only abnormal results are displayed) ?Labs Reviewed  ?CBC WITH DIFFERENTIAL/PLATELET - Abnormal; Notable for the following components:  ?    Result Value  ? RBC 5.88 (*)   ? RDW 20.5 (*)   ? Abs Immature Granulocytes 0.09 (*)   ? All other components within normal limits  ?COMPREHENSIVE METABOLIC PANEL - Abnormal; Notable for the following components:  ? Glucose, Bld 290 (*)   ? Calcium 8.7 (*)   ? Total Protein 6.0 (*)   ? Albumin 3.3 (*)   ? Alkaline Phosphatase 37 (*)   ? All other components within normal limits  ?PROTIME-INR - Abnormal; Notable for the following components:  ? Prothrombin Time 16.8 (*)   ? INR 1.4 (*)   ? All other components within normal limits  ? ? ? ? ?RADIOLOGY ?Ultrasound venous right lower extremity images  were reviewed.  Radiology report is positive for acute occlusive noncompressible thrombus in the right posterior tibial vein ?  ?  ?ROCEDURES: ? ?Critical Care performed:  ? ?Procedures ? ? ?MEDICATIONS ORDERED IN ED: ?Medications - No data to display ? ? ?IMPRESSION / MDM / ASSESSMENT AND PLAN / ED COURSE  ?I reviewed the triage vital signs and the nursing notes. ? ? ?Differential diagnosis includes, but is not limited to, DVT, acute right lower extremity pain, muscle strain. ? ?76 year old male presents to the ED with complaint of right lower leg pain that began this morning when he woke up feeling like it was his pain he is familiar with as he has had DVTs in the past.  Patient states that he is on Coumadin and has been off of it for approximately 1 week due to his levels being elevated.  He just recently started back on his dose of Coumadin.  Physical exam is concerning for a DVT and ultrasound does show an acute occlusive noncompressible thrombus in the right posterior tibial vein.  Pulses both DP and TP were cyst and confirmed with Doppler.  I discussed this  case with Dr. Hulan Saas who is my attending today.  It was decided that patient would be given a prescription for Lovenox which patient has already used in the past and is capable of giving himself shots.  Prescription was sent to his pharmacy and patient will call his PCP on Monday for follow-up and further evaluation.  He is to return to the emergency department if any severe worsening of his symptoms or urgent concerns. ? ? ? ?FINAL CLINICAL IMPRESSION(S) / ED DIAGNOSES  ? ?Final diagnoses:  ?Tibial vein thrombosis, right ([place])  ? ? ? ?Rx / DC Orders  ? ?ED Discharge Orders   ? ?      Ordered  ?  enoxaparin (LOVENOX) 80 MG/0.8ML injection  2 times daily       ? 11/21/21 1240  ? ?  ?  ? ?  ? ? ? ?Note:  This document was prepared using Dragon voice recognition software and may include unintentional dictation errors. ?  ?Johnn Hai, PA-C ?11/21/21 1558 ? ?  ?Lucrezia Starch, MD ?11/21/21 1643 ? ?

## 2021-11-21 NOTE — ED Triage Notes (Addendum)
Pt via POV from home. Pt c/o R leg swelling and pain, pt has a hx of DVT. Pt believes he has a blood clot. Pt is on Warfarin but states that he had been off of it for a week. Denies CP/SOB. Pt is A&Ox4 and NAD. Ambulatory to room  ?

## 2021-11-23 DIAGNOSIS — E782 Mixed hyperlipidemia: Secondary | ICD-10-CM | POA: Diagnosis not present

## 2021-11-23 DIAGNOSIS — E1169 Type 2 diabetes mellitus with other specified complication: Secondary | ICD-10-CM | POA: Diagnosis not present

## 2021-11-23 DIAGNOSIS — B3781 Candidal esophagitis: Secondary | ICD-10-CM | POA: Diagnosis not present

## 2021-11-23 DIAGNOSIS — D6859 Other primary thrombophilia: Secondary | ICD-10-CM | POA: Diagnosis not present

## 2021-11-23 DIAGNOSIS — E119 Type 2 diabetes mellitus without complications: Secondary | ICD-10-CM | POA: Diagnosis not present

## 2021-11-23 DIAGNOSIS — I82401 Acute embolism and thrombosis of unspecified deep veins of right lower extremity: Secondary | ICD-10-CM | POA: Diagnosis not present

## 2021-11-23 DIAGNOSIS — I82441 Acute embolism and thrombosis of right tibial vein: Secondary | ICD-10-CM | POA: Diagnosis not present

## 2021-11-24 DIAGNOSIS — J312 Chronic pharyngitis: Secondary | ICD-10-CM | POA: Diagnosis not present

## 2021-11-24 DIAGNOSIS — J342 Deviated nasal septum: Secondary | ICD-10-CM | POA: Diagnosis not present

## 2021-11-24 DIAGNOSIS — B379 Candidiasis, unspecified: Secondary | ICD-10-CM | POA: Diagnosis not present

## 2021-12-07 DIAGNOSIS — D6859 Other primary thrombophilia: Secondary | ICD-10-CM | POA: Diagnosis not present

## 2021-12-10 DIAGNOSIS — B9689 Other specified bacterial agents as the cause of diseases classified elsewhere: Secondary | ICD-10-CM | POA: Diagnosis not present

## 2021-12-10 DIAGNOSIS — Z7901 Long term (current) use of anticoagulants: Secondary | ICD-10-CM | POA: Diagnosis not present

## 2021-12-10 DIAGNOSIS — J019 Acute sinusitis, unspecified: Secondary | ICD-10-CM | POA: Diagnosis not present

## 2021-12-14 DIAGNOSIS — N393 Stress incontinence (female) (male): Secondary | ICD-10-CM | POA: Diagnosis not present

## 2021-12-14 DIAGNOSIS — Z8551 Personal history of malignant neoplasm of bladder: Secondary | ICD-10-CM | POA: Diagnosis not present

## 2021-12-14 DIAGNOSIS — Z8546 Personal history of malignant neoplasm of prostate: Secondary | ICD-10-CM | POA: Diagnosis not present

## 2021-12-14 DIAGNOSIS — N5231 Erectile dysfunction following radical prostatectomy: Secondary | ICD-10-CM | POA: Diagnosis not present

## 2021-12-27 DIAGNOSIS — Z8546 Personal history of malignant neoplasm of prostate: Secondary | ICD-10-CM | POA: Diagnosis not present

## 2021-12-29 DIAGNOSIS — Z955 Presence of coronary angioplasty implant and graft: Secondary | ICD-10-CM | POA: Diagnosis not present

## 2021-12-29 DIAGNOSIS — I10 Essential (primary) hypertension: Secondary | ICD-10-CM | POA: Diagnosis not present

## 2021-12-29 DIAGNOSIS — E119 Type 2 diabetes mellitus without complications: Secondary | ICD-10-CM | POA: Diagnosis not present

## 2021-12-29 DIAGNOSIS — R42 Dizziness and giddiness: Secondary | ICD-10-CM | POA: Diagnosis not present

## 2021-12-29 DIAGNOSIS — K219 Gastro-esophageal reflux disease without esophagitis: Secondary | ICD-10-CM | POA: Diagnosis not present

## 2021-12-29 DIAGNOSIS — Z86711 Personal history of pulmonary embolism: Secondary | ICD-10-CM | POA: Diagnosis not present

## 2021-12-29 DIAGNOSIS — I2510 Atherosclerotic heart disease of native coronary artery without angina pectoris: Secondary | ICD-10-CM | POA: Diagnosis not present

## 2021-12-29 DIAGNOSIS — E782 Mixed hyperlipidemia: Secondary | ICD-10-CM | POA: Diagnosis not present

## 2022-01-03 ENCOUNTER — Other Ambulatory Visit: Payer: Self-pay | Admitting: Internal Medicine

## 2022-01-03 DIAGNOSIS — N3946 Mixed incontinence: Secondary | ICD-10-CM | POA: Diagnosis not present

## 2022-01-03 DIAGNOSIS — M5412 Radiculopathy, cervical region: Secondary | ICD-10-CM | POA: Diagnosis not present

## 2022-01-03 DIAGNOSIS — E291 Testicular hypofunction: Secondary | ICD-10-CM | POA: Diagnosis not present

## 2022-01-03 DIAGNOSIS — Z8546 Personal history of malignant neoplasm of prostate: Secondary | ICD-10-CM | POA: Diagnosis not present

## 2022-01-03 DIAGNOSIS — M4802 Spinal stenosis, cervical region: Secondary | ICD-10-CM

## 2022-01-03 DIAGNOSIS — Z8551 Personal history of malignant neoplasm of bladder: Secondary | ICD-10-CM | POA: Diagnosis not present

## 2022-01-03 DIAGNOSIS — M5010 Cervical disc disorder with radiculopathy, unspecified cervical region: Secondary | ICD-10-CM

## 2022-01-03 DIAGNOSIS — D6859 Other primary thrombophilia: Secondary | ICD-10-CM | POA: Diagnosis not present

## 2022-01-12 DIAGNOSIS — Z87442 Personal history of urinary calculi: Secondary | ICD-10-CM | POA: Diagnosis not present

## 2022-01-12 DIAGNOSIS — R7989 Other specified abnormal findings of blood chemistry: Secondary | ICD-10-CM | POA: Diagnosis not present

## 2022-01-12 DIAGNOSIS — N393 Stress incontinence (female) (male): Secondary | ICD-10-CM | POA: Diagnosis not present

## 2022-01-12 DIAGNOSIS — Z8551 Personal history of malignant neoplasm of bladder: Secondary | ICD-10-CM | POA: Diagnosis not present

## 2022-01-12 DIAGNOSIS — N5237 Erectile dysfunction following prostate ablative therapy: Secondary | ICD-10-CM | POA: Diagnosis not present

## 2022-01-12 DIAGNOSIS — Z8546 Personal history of malignant neoplasm of prostate: Secondary | ICD-10-CM | POA: Diagnosis not present

## 2022-01-12 DIAGNOSIS — N281 Cyst of kidney, acquired: Secondary | ICD-10-CM | POA: Diagnosis not present

## 2022-01-19 ENCOUNTER — Ambulatory Visit
Admission: RE | Admit: 2022-01-19 | Discharge: 2022-01-19 | Disposition: A | Discharge status: Home / Self Care | Payer: Medicare HMO | Source: Ambulatory Visit | Attending: Internal Medicine | Admitting: Internal Medicine

## 2022-01-19 DIAGNOSIS — M4802 Spinal stenosis, cervical region: Secondary | ICD-10-CM | POA: Insufficient documentation

## 2022-01-19 DIAGNOSIS — M47812 Spondylosis without myelopathy or radiculopathy, cervical region: Secondary | ICD-10-CM | POA: Diagnosis not present

## 2022-01-19 DIAGNOSIS — M5010 Cervical disc disorder with radiculopathy, unspecified cervical region: Secondary | ICD-10-CM | POA: Diagnosis not present

## 2022-02-03 DIAGNOSIS — W57XXXA Bitten or stung by nonvenomous insect and other nonvenomous arthropods, initial encounter: Secondary | ICD-10-CM | POA: Diagnosis not present

## 2022-02-03 DIAGNOSIS — S80862A Insect bite (nonvenomous), left lower leg, initial encounter: Secondary | ICD-10-CM | POA: Diagnosis not present

## 2022-02-07 DIAGNOSIS — M5030 Other cervical disc degeneration, unspecified cervical region: Secondary | ICD-10-CM | POA: Diagnosis not present

## 2022-02-07 DIAGNOSIS — M7541 Impingement syndrome of right shoulder: Secondary | ICD-10-CM | POA: Diagnosis not present

## 2022-02-07 DIAGNOSIS — M5412 Radiculopathy, cervical region: Secondary | ICD-10-CM | POA: Diagnosis not present

## 2022-02-07 DIAGNOSIS — M7542 Impingement syndrome of left shoulder: Secondary | ICD-10-CM | POA: Diagnosis not present

## 2022-02-11 ENCOUNTER — Emergency Department
Admission: EM | Admit: 2022-02-11 | Discharge: 2022-02-11 | Disposition: A | Discharge status: Home / Self Care | Payer: Medicare HMO | Attending: Emergency Medicine | Admitting: Emergency Medicine

## 2022-02-11 ENCOUNTER — Emergency Department: Payer: Medicare HMO

## 2022-02-11 ENCOUNTER — Encounter: Payer: Self-pay | Admitting: Medical Oncology

## 2022-02-11 DIAGNOSIS — R1310 Dysphagia, unspecified: Secondary | ICD-10-CM | POA: Diagnosis not present

## 2022-02-11 DIAGNOSIS — Z955 Presence of coronary angioplasty implant and graft: Secondary | ICD-10-CM | POA: Insufficient documentation

## 2022-02-11 DIAGNOSIS — Z7901 Long term (current) use of anticoagulants: Secondary | ICD-10-CM | POA: Diagnosis not present

## 2022-02-11 DIAGNOSIS — N189 Chronic kidney disease, unspecified: Secondary | ICD-10-CM | POA: Insufficient documentation

## 2022-02-11 DIAGNOSIS — R079 Chest pain, unspecified: Secondary | ICD-10-CM

## 2022-02-11 DIAGNOSIS — I2510 Atherosclerotic heart disease of native coronary artery without angina pectoris: Secondary | ICD-10-CM | POA: Diagnosis not present

## 2022-02-11 DIAGNOSIS — R0789 Other chest pain: Secondary | ICD-10-CM | POA: Diagnosis not present

## 2022-02-11 LAB — CBC
HCT: 52.2 % — ABNORMAL HIGH (ref 39.0–52.0)
Hemoglobin: 16.3 g/dL (ref 13.0–17.0)
MCH: 27.8 pg (ref 26.0–34.0)
MCHC: 31.2 g/dL (ref 30.0–36.0)
MCV: 89.1 fL (ref 80.0–100.0)
Platelets: 642 10*3/uL — ABNORMAL HIGH (ref 150–400)
RBC: 5.86 MIL/uL — ABNORMAL HIGH (ref 4.22–5.81)
RDW: 17.2 % — ABNORMAL HIGH (ref 11.5–15.5)
WBC: 13.9 10*3/uL — ABNORMAL HIGH (ref 4.0–10.5)
nRBC: 0 % (ref 0.0–0.2)

## 2022-02-11 LAB — TROPONIN I (HIGH SENSITIVITY)
Troponin I (High Sensitivity): 17 ng/L (ref <18)
Troponin I (High Sensitivity): 16 ng/L (ref <18)

## 2022-02-11 LAB — BASIC METABOLIC PANEL
Anion gap: 7 (ref 5–15)
BUN: 13 mg/dL (ref 8–23)
CO2: 23 mmol/L (ref 22–32)
Calcium: 8.9 mg/dL (ref 8.9–10.3)
Chloride: 110 mmol/L (ref 98–111)
Creatinine, Ser: 1.24 mg/dL (ref 0.61–1.24)
GFR, Estimated: >60 mL/min (ref >60)
Glucose, Bld: 146 mg/dL — ABNORMAL HIGH (ref 70–99)
Potassium: 3.6 mmol/L (ref 3.5–5.1)
Sodium: 140 mmol/L (ref 135–145)

## 2022-02-11 MED ORDER — IOHEXOL 350 MG/ML SOLN
75.0000 mL | Freq: Once | INTRAVENOUS | Status: AC | PRN
  Administered 2022-02-11: 75 mL via INTRAVENOUS

## 2022-02-11 MED ORDER — FAMOTIDINE 20 MG PO TABS
40.0000 mg | ORAL_TABLET | Freq: Once | ORAL | Status: AC
  Administered 2022-02-11: 40 mg via ORAL
  Filled 2022-02-11: qty 2

## 2022-02-11 MED ORDER — ALUM & MAG HYDROXIDE-SIMETH 200-200-20 MG/5ML PO SUSP
30.0000 mL | Freq: Once | ORAL | Status: AC
  Administered 2022-02-11: 30 mL via ORAL
  Filled 2022-02-11: qty 30

## 2022-02-11 MED ORDER — LIDOCAINE VISCOUS HCL 2 % MT SOLN
15.0000 mL | Freq: Once | OROMUCOSAL | Status: AC
  Administered 2022-02-11: 15 mL via ORAL
  Filled 2022-02-11: qty 15

## 2022-02-11 NOTE — ED Provider Notes (Signed)
  Physical Exam  BP (!) 169/93   Pulse 64   Temp 97.9 F (36.6 C) (Oral)   Resp 15   Ht '5\' 9"'$  (1.753 m)   Wt 77 kg   SpO2 99%   BMI 25.07 kg/m   Physical Exam  Procedures  Procedures  ED Course / MDM   Clinical Course as of 02/11/22 2319  [facility] Feb 11, 2022  2301 Hemoglobin: 16.3 [JW]  2307 WBC(!): 13.9 [JW]    Clinical Course User Index [JW] Lannie Fields, PA-C   Medical Decision Making Amount and/or Complexity of Data Reviewed Labs: ordered. Decision-making details documented in ED Course. Radiology: ordered.  Risk OTC drugs. Prescription drug management.   I personally interpreted CTA and agree with radiologist interpretation.  No signs of PE.  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  I did communicate aneurysm findings to patient and wife.  Patient was given a GI cocktail and he reported some improvement in his symptoms and was able to eat a sandwich tray prior to discharge.  I cautioned patient to return to the emergency department with new or worsening symptoms.  All patient questions were answered.       Vallarie Mare Dunn Loring, PA-C 02/11/22 2320    Arta Silence, MD 02/12/22 1505

## 2022-02-11 NOTE — ED Triage Notes (Signed)
Pt from [facility] reports that he has been having rt sided chest pain since Wednesday. States that he feels like the pain is worse when he swallows, thought that he may have had something stuck in his throat. Pt denies sob.

## 2022-02-11 NOTE — ED Notes (Signed)
Unable to obtain signature at time of discharge as pt is no longer in the department. Placed call to [place] who states that she removed the pt's IV prior to his departure from the hospital.

## 2022-02-11 NOTE — ED Provider Notes (Signed)
[facility] Provider Note    Event Date/Time   First MD Initiated Contact with Patient 02/11/22 1754     (approximate)  History   Chief Complaint: Chest Pain  HPI  Phillip [name] is a 76 y.o. male with a past medical history of CAD status post stent/MI, history of DVT and PE on Eliquis, CKD, presents to the emergency department for chest pain.  According to the patient for the past 2 days he has been experiencing intermittent pain in the right chest.  Patient states it seems like the pain is worse sometimes when he swallows.  Denies any shortness of breath.  Denies any fever.  States he has been coughing somewhat recently as well.  States he is not having chest "pain" states some slight discomfort at times mostly when he swallows.  Physical Exam   Triage Vital Signs: ED Triage Vitals  Enc Vitals Group     BP 02/11/22 1529 (!) 143/91     Pulse Rate 02/11/22 1529 76     Resp 02/11/22 1529 18     Temp 02/11/22 1529 97.9 F (36.6 C)     Temp Source 02/11/22 1529 Oral     SpO2 02/11/22 1529 97 %     Weight 02/11/22 1533 169 lb 12.1 oz (77 kg)     Height 02/11/22 1533 '5\' 9"'$  (1.753 m)     Head Circumference --      Peak Flow --      Pain Score 02/11/22 1533 5     Pain Loc --      Pain Edu? --      Excl. in [place]? --     Most recent vital signs: Vitals:   02/11/22 1529  BP: (!) 143/91  Pulse: 76  Resp: 18  Temp: 97.9 F (36.6 C)  SpO2: 97%    General: Awake, no distress.  CV:  Good peripheral perfusion.  Regular rate and rhythm  Resp:  Normal effort.  Equal breath sounds bilaterally.  Abd:  No distention.  Soft, nontender.  No rebound or guarding. Other:  No lower extremity edema or tenderness.   ED Results / Procedures / Treatments   EKG  EKG viewed and interpreted by myself shows a sinus rhythm at 73 bpm with a narrow QRS, left axis deviation slight PR prolongation otherwise normal intervals with no concerning ST changes.  RADIOLOGY  I  have interpreted the x-ray images I see no significant abnormality. Chest x-ray read as negative by radiology.   MEDICATIONS ORDERED IN ED: Medications - No data to display   IMPRESSION / MDM / [place] / ED COURSE  I reviewed the triage vital signs and the nursing notes.  Patient presents to the emergency department for right-sided chest discomfort mostly when swallowing.  Patient has a history of MI, CAD with multiple stents as well as pulmonary emboli in the past.  Given the patient's complaints we will obtain labs including cardiac enzymes we will obtain a CTA of the chest rule out PE.  Differential is quite broad but would include CAD, MI/ACS, PE, pneumonia, Boerhaave's/esophageal injury, musculoskeletal pain.  Lab work shows a negative troponin.  Reassuring chemistry.  Normal CBC.  Repeat troponin and CT scan pending.  Patient signed out to oncoming provider.  FINAL CLINICAL IMPRESSION(S) / ED DIAGNOSES   Chest pain   Note:  This document was prepared using Dragon voice recognition software and may include unintentional dictation errors.   Jordynn Marcella,  Lennette Bihari, MD 02/13/22 Pauline Aus

## 2022-02-13 ENCOUNTER — Encounter: Payer: Self-pay | Admitting: Emergency Medicine

## 2022-02-13 ENCOUNTER — Emergency Department
Admission: EM | Admit: 2022-02-13 | Discharge: 2022-02-13 | Disposition: A | Discharge status: Home / Self Care | Payer: Medicare HMO | Attending: Emergency Medicine | Admitting: Emergency Medicine

## 2022-02-13 ENCOUNTER — Emergency Department: Payer: Medicare HMO

## 2022-02-13 ENCOUNTER — Other Ambulatory Visit: Payer: Self-pay

## 2022-02-13 DIAGNOSIS — N183 Chronic kidney disease, stage 3 unspecified: Secondary | ICD-10-CM | POA: Diagnosis not present

## 2022-02-13 DIAGNOSIS — R102 Pelvic and perineal pain: Secondary | ICD-10-CM | POA: Insufficient documentation

## 2022-02-13 DIAGNOSIS — Z8551 Personal history of malignant neoplasm of bladder: Secondary | ICD-10-CM | POA: Insufficient documentation

## 2022-02-13 DIAGNOSIS — N281 Cyst of kidney, acquired: Secondary | ICD-10-CM | POA: Diagnosis not present

## 2022-02-13 DIAGNOSIS — R1031 Right lower quadrant pain: Secondary | ICD-10-CM | POA: Diagnosis present

## 2022-02-13 DIAGNOSIS — I129 Hypertensive chronic kidney disease with stage 1 through stage 4 chronic kidney disease, or unspecified chronic kidney disease: Secondary | ICD-10-CM | POA: Insufficient documentation

## 2022-02-13 DIAGNOSIS — I25110 Atherosclerotic heart disease of native coronary artery with unstable angina pectoris: Secondary | ICD-10-CM | POA: Insufficient documentation

## 2022-02-13 DIAGNOSIS — Z7901 Long term (current) use of anticoagulants: Secondary | ICD-10-CM | POA: Insufficient documentation

## 2022-02-13 DIAGNOSIS — Z8546 Personal history of malignant neoplasm of prostate: Secondary | ICD-10-CM | POA: Insufficient documentation

## 2022-02-13 DIAGNOSIS — N200 Calculus of kidney: Secondary | ICD-10-CM | POA: Diagnosis not present

## 2022-02-13 DIAGNOSIS — I700 Atherosclerosis of aorta: Secondary | ICD-10-CM | POA: Diagnosis not present

## 2022-02-13 DIAGNOSIS — E1122 Type 2 diabetes mellitus with diabetic chronic kidney disease: Secondary | ICD-10-CM | POA: Insufficient documentation

## 2022-02-13 LAB — CBC
HCT: 54.2 % — ABNORMAL HIGH (ref 39.0–52.0)
Hemoglobin: 16.6 g/dL (ref 13.0–17.0)
MCH: 27.1 pg (ref 26.0–34.0)
MCHC: 30.6 g/dL (ref 30.0–36.0)
MCV: 88.6 fL (ref 80.0–100.0)
Platelets: 630 10*3/uL — ABNORMAL HIGH (ref 150–400)
RBC: 6.12 MIL/uL — ABNORMAL HIGH (ref 4.22–5.81)
RDW: 17.8 % — ABNORMAL HIGH (ref 11.5–15.5)
WBC: 12.6 10*3/uL — ABNORMAL HIGH (ref 4.0–10.5)
nRBC: 0 % (ref 0.0–0.2)

## 2022-02-13 LAB — COMPREHENSIVE METABOLIC PANEL
ALT: 20 U/L (ref 0–44)
AST: 21 U/L (ref 15–41)
Albumin: 3.6 g/dL (ref 3.5–5.0)
Alkaline Phosphatase: 47 U/L (ref 38–126)
Anion gap: 4 — ABNORMAL LOW (ref 5–15)
BUN: 13 mg/dL (ref 8–23)
CO2: 24 mmol/L (ref 22–32)
Calcium: 8.5 mg/dL — ABNORMAL LOW (ref 8.9–10.3)
Chloride: 109 mmol/L (ref 98–111)
Creatinine, Ser: 1.1 mg/dL (ref 0.61–1.24)
GFR, Estimated: >60 mL/min (ref >60)
Glucose, Bld: 194 mg/dL — ABNORMAL HIGH (ref 70–99)
Potassium: 3.7 mmol/L (ref 3.5–5.1)
Sodium: 137 mmol/L (ref 135–145)
Total Bilirubin: 0.9 mg/dL (ref 0.3–1.2)
Total Protein: 6.9 g/dL (ref 6.5–8.1)

## 2022-02-13 LAB — URINALYSIS, ROUTINE W REFLEX MICROSCOPIC
Bilirubin Urine: NEGATIVE
Glucose, UA: NEGATIVE mg/dL
Hgb urine dipstick: NEGATIVE
Ketones, ur: NEGATIVE mg/dL
Leukocytes,Ua: NEGATIVE
Nitrite: NEGATIVE
Protein, ur: NEGATIVE mg/dL
Specific Gravity, Urine: 1.018 (ref 1.005–1.030)
pH: 5 (ref 5.0–8.0)

## 2022-02-13 MED ORDER — OXYCODONE HCL 5 MG PO TABS: 5.0000 mg | ORAL_TABLET | Freq: Three times a day (TID) | ORAL | 0 refills | Status: AC | PRN

## 2022-02-13 MED ORDER — OXYCODONE-ACETAMINOPHEN 5-325 MG PO TABS
1.0000 | ORAL_TABLET | Freq: Once | ORAL | Status: AC
  Administered 2022-02-13: 1 via ORAL
  Filled 2022-02-13: qty 1

## 2022-02-13 NOTE — Discharge Instructions (Addendum)
Your CAT scan and blood work and urine sample were all reassuring.  We did not find an obvious cause of your pain.  It could be related to your prosthetic.  Please follow-up with your urologist on Tuesday if you continue to have pain.  Return to the emergency department if pain is uncontrolled you are unable to urinate or you develop fevers or any new symptoms that are concerning to you.  You can continue to take Tylenol and you can take the oxycodone which I have sent to your pharmacy for breakthrough pain.  If you are going to take the oxycodone I recommend taking a stool softener as well as it can cause constipation.  Please do not drive after taking this medication.

## 2022-02-13 NOTE — ED Triage Notes (Signed)
Pt reports severe pain in his pubic area since yesterday. Pt states it feels better when he is laying dow but when he stands the pain takes his breath away. Pt reports did not do anything to injure it. Pt denies NVD. Pt denies falling or twisting or turning. Pt reports pain is constant. Last BM this am and normal. Denies urinary sx's.

## 2022-02-13 NOTE — ED Provider Notes (Signed)
[facility] Provider Note    Event Date/Time   First MD Initiated Contact with Patient 02/13/22 [phone]     (approximate)   History   Abdominal Pain   HPI  Phillip [name] is a 76 y.o. male   past medical history of coronary disease s/p stenting, DVT and PE on Eliquis, CKD who presents with suprapubic pain.  Symptoms started around 7 PM last night.  Patient has pain right over the pubic bone and just in the suprapubic region.  Says that when he is not moving that it is relatively well controlled but is worse with movement especially when he.  The pain does not move it is midline does not radiate to the back.  Denies back pain urinary symptoms.  He has a penile prosthetic has not had issues with this.  Does have history of chronic urinary incontinence this is not new he is actually scheduled for a sphincter surgery.  Denies burning pain with urination or hematuria.  No history of similar pain.  No injuries or any clear provocative event.     Past Medical History:  Diagnosis Date   Arthritis    Basal cell carcinoma    L clavicle, L prox forearm- removed years ago    Bladder cancer (HCC)    Bladder neck contracture    CAD (coronary artery disease)    a. 12/2020 NSTEMI/Cath: LM nl, LAD 95ost/p, LCX 50p, RCA nl. EF 45-50%.   Cataract    CKD (chronic kidney disease), stage III [facility])    ED (erectile dysfunction)    Frequency    GERD (gastroesophageal reflux disease)    History of pulmonary embolus (PE) 11/2018   a. Following LE DVT-->Chronic warfarin.   Hyperlipidemia LDL goal <70    Hypertension    Hypothyroidism    Incontinence of urine    sui, s/p cryoablation   Ischemic cardiomyopathy    a. 11/2018 Echo: EF 60-65%; b. 12/2020 LV Gram: EF 45-50% in setting of NSTEMI.   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 pylori) infection 09/04/2017   Medicare annual wellness visit, initial 03/31/2017   History of prostate cancer 10/27/2014   Lower urinary tract symptoms (LUTS) 03/18/2012   Nephrolithiasis 03/18/2012     Physical Exam  Triage Vital Signs: ED Triage Vitals  Enc Vitals Group     BP 02/13/22 0743 131/80     Pulse Rate 02/13/22 0743 75     Resp 02/13/22 0743 18     Temp 02/13/22 0743 99.2 F (37.3 C)     Temp Source 02/13/22 0743 Oral     SpO2 02/13/22 0743 95 %     Weight 02/13/22 0730 170 lb (77.1 kg)     Height 02/13/22 0730 '5\' 9"'$  (1.753 m)     Head Circumference --      Peak Flow --      Pain Score 02/13/22 0730 9     Pain Loc --      Pain Edu? --      Excl. in [place]? --     Most recent vital signs: Vitals:   02/13/22 0743 02/13/22 0847  BP: 131/80 (!) 142/84  Pulse: 75 64  Resp: 18 18  Temp: 99.2 F (37.3 C)   SpO2: 95% 97%     General: Awake, no distress.  Patient appears comfortable and  sitting still, looks uncomfortable with movement CV:  Good peripheral perfusion.  Resp:  Normal effort.  Abd:  No distention.  Mild tenderness to palpation in the suprapubic region and inguinal region there is some right inguinal fullness but this is also the site of his penile prosthetic no obvious palpable hernia no testicular tenderness no rash or skin change abdomen is otherwise soft and nontender throughout Neuro:             Awake, Alert, Oriented x 3  Other:     ED Results / Procedures / Treatments  Labs (all labs ordered are listed, but only abnormal results are displayed) Labs Reviewed  COMPREHENSIVE METABOLIC PANEL - Abnormal; Notable for the following components:      Result Value   Glucose, Bld 194 (*)    Calcium 8.5 (*)    Anion gap 4 (*)    All other components within normal limits  CBC - Abnormal; Notable for the following components:   WBC 12.6 (*)    RBC 6.12 (*)    HCT 54.2 (*)    RDW 17.8 (*)    Platelets 630 (*)    All other components within normal limits  URINALYSIS, ROUTINE W REFLEX MICROSCOPIC - Abnormal; Notable for the following components:   Color, Urine YELLOW (*)    APPearance HAZY (*)    All other components within normal limits     EKG     RADIOLOGY CT abdomen pelvis interpreted by myself shows no free air   PROCEDURES:  Critical Care performed: No  Procedures    MEDICATIONS ORDERED IN ED: Medications  oxyCODONE-acetaminophen (PERCOCET/ROXICET) 5-325 MG per tablet 1 tablet (1 tablet Oral Given 02/13/22 0840)     IMPRESSION / MDM / ASSESSMENT AND PLAN / ED COURSE  I reviewed the triage vital signs and the nursing notes.                              Differential diagnosis includes, but is not limited to, complication related to penile prosthetic, hernia, musculoskeletal pain, nerve entrapment  Patient is 76 year old male who presents with suprapubic pain.  It is worse with movement started around 7 PM last night with no clear  provocative event.  Vitals are within normal limits he appears well and he is sitting still when you ask him to  move and sit up he does look uncomfortable.  He has mild tenderness to palpation in the suprapubic region with some right inguinal fullness no obvious palpable hernia he does have a penile prosthetic which could be contributing to the fullness.  There is no signs of infection in the GU region.  Abdomen itself is nontender.  Given the degree of pain he is in we will obtain a CT abdomen pelvis to further evaluate the region.  We will treat with oral Percocet.    CT abdomen pelvis read by radiology as essentially normal.  No cause of the patient's pain identified.  On reassessment he is laying comfortably again if he is not moving he has no discomfort.  Question whether this is musculoskeletal versus neuropathic versus somehow related to his penile prosthetic.  Again there is no signs of infection on external exam.  No obvious cause identified on CT.  Recommended Tylenol and will prescribe short course of oxycodone for breakthrough pain.  We discussed safety precautions around opiates for his age.  I advised that he follow-up with his urologist on Tuesday as of tomorrow is a holiday for further evaluation.  We also discussed return precautions.  FINAL CLINICAL IMPRESSION(S) / ED DIAGNOSES   Final diagnoses:  Pelvic pain     Rx / DC Orders   ED Discharge Orders          Ordered    oxyCODONE (ROXICODONE) 5 MG immediate release tablet  Every 8 hours PRN        02/13/22 1002             Note:  This document was prepared using Dragon voice recognition software and may include unintentional dictation errors.   Rada Hay, MD 02/13/22 1005

## 2022-02-24 DIAGNOSIS — M7542 Impingement syndrome of left shoulder: Secondary | ICD-10-CM | POA: Diagnosis not present

## 2022-02-24 DIAGNOSIS — M7541 Impingement syndrome of right shoulder: Secondary | ICD-10-CM | POA: Diagnosis not present

## 2022-02-25 DIAGNOSIS — Z8551 Personal history of malignant neoplasm of bladder: Secondary | ICD-10-CM | POA: Diagnosis not present

## 2022-02-25 DIAGNOSIS — E782 Mixed hyperlipidemia: Secondary | ICD-10-CM | POA: Diagnosis not present

## 2022-02-25 DIAGNOSIS — R800 Isolated proteinuria: Secondary | ICD-10-CM | POA: Diagnosis not present

## 2022-02-25 DIAGNOSIS — N393 Stress incontinence (female) (male): Secondary | ICD-10-CM | POA: Diagnosis not present

## 2022-02-25 DIAGNOSIS — Z8546 Personal history of malignant neoplasm of prostate: Secondary | ICD-10-CM | POA: Diagnosis not present

## 2022-02-25 DIAGNOSIS — N5237 Erectile dysfunction following prostate ablative therapy: Secondary | ICD-10-CM | POA: Diagnosis not present

## 2022-02-25 DIAGNOSIS — N281 Cyst of kidney, acquired: Secondary | ICD-10-CM | POA: Diagnosis not present

## 2022-02-25 DIAGNOSIS — E1169 Type 2 diabetes mellitus with other specified complication: Secondary | ICD-10-CM | POA: Diagnosis not present

## 2022-02-25 DIAGNOSIS — R7989 Other specified abnormal findings of blood chemistry: Secondary | ICD-10-CM | POA: Diagnosis not present

## 2022-02-25 DIAGNOSIS — R82998 Other abnormal findings in urine: Secondary | ICD-10-CM | POA: Diagnosis not present

## 2022-02-25 DIAGNOSIS — N3289 Other specified disorders of bladder: Secondary | ICD-10-CM | POA: Diagnosis not present

## 2022-03-01 DIAGNOSIS — N393 Stress incontinence (female) (male): Secondary | ICD-10-CM | POA: Diagnosis not present

## 2022-03-02 DIAGNOSIS — D72825 Bandemia: Secondary | ICD-10-CM | POA: Diagnosis not present

## 2022-03-02 DIAGNOSIS — Z01810 Encounter for preprocedural cardiovascular examination: Secondary | ICD-10-CM | POA: Diagnosis not present

## 2022-03-03 ENCOUNTER — Emergency Department
Admission: EM | Admit: 2022-03-03 | Discharge: 2022-03-03 | Disposition: A | Discharge status: Home / Self Care | Payer: Medicare HMO | Attending: Emergency Medicine | Admitting: Emergency Medicine

## 2022-03-03 ENCOUNTER — Other Ambulatory Visit: Payer: Self-pay

## 2022-03-03 DIAGNOSIS — X58XXXA Exposure to other specified factors, initial encounter: Secondary | ICD-10-CM | POA: Diagnosis not present

## 2022-03-03 DIAGNOSIS — T148XXA Other injury of unspecified body region, initial encounter: Secondary | ICD-10-CM

## 2022-03-03 DIAGNOSIS — I10 Essential (primary) hypertension: Secondary | ICD-10-CM | POA: Diagnosis not present

## 2022-03-03 DIAGNOSIS — M62830 Muscle spasm of back: Secondary | ICD-10-CM | POA: Diagnosis not present

## 2022-03-03 DIAGNOSIS — E119 Type 2 diabetes mellitus without complications: Secondary | ICD-10-CM | POA: Insufficient documentation

## 2022-03-03 DIAGNOSIS — S39012A Strain of muscle, fascia and tendon of lower back, initial encounter: Secondary | ICD-10-CM | POA: Insufficient documentation

## 2022-03-03 DIAGNOSIS — Y9353 Activity, golf: Secondary | ICD-10-CM | POA: Diagnosis not present

## 2022-03-03 DIAGNOSIS — S3992XA Unspecified injury of lower back, initial encounter: Secondary | ICD-10-CM | POA: Diagnosis present

## 2022-03-03 DIAGNOSIS — M7542 Impingement syndrome of left shoulder: Secondary | ICD-10-CM | POA: Diagnosis not present

## 2022-03-03 DIAGNOSIS — M7541 Impingement syndrome of right shoulder: Secondary | ICD-10-CM | POA: Diagnosis not present

## 2022-03-03 MED ORDER — ORPHENADRINE CITRATE 30 MG/ML IJ SOLN
60.0000 mg | Freq: Once | INTRAMUSCULAR | Status: AC
  Administered 2022-03-03: 60 mg via INTRAMUSCULAR
  Filled 2022-03-03: qty 2

## 2022-03-03 MED ORDER — PREDNISONE 10 MG PO TABS
10.0000 mg | ORAL_TABLET | ORAL | 0 refills | Status: DC
Start: 2022-03-03 — End: 2022-03-28

## 2022-03-03 MED ORDER — DEXAMETHASONE SODIUM PHOSPHATE 10 MG/ML IJ SOLN
10.0000 mg | Freq: Once | INTRAMUSCULAR | Status: AC
  Administered 2022-03-03: 10 mg via INTRAMUSCULAR
  Filled 2022-03-03: qty 1

## 2022-03-03 MED ORDER — METHOCARBAMOL 500 MG PO TABS
500.0000 mg | ORAL_TABLET | Freq: Four times a day (QID) | ORAL | 0 refills | Status: DC
Start: 2022-03-03 — End: 2022-03-03

## 2022-03-03 MED ORDER — METHOCARBAMOL 500 MG PO TABS
500.0000 mg | ORAL_TABLET | Freq: Four times a day (QID) | ORAL | 0 refills | Status: DC
Start: 2022-03-03 — End: 2022-03-28

## 2022-03-03 MED ORDER — PREDNISONE 10 MG PO TABS
10.0000 mg | ORAL_TABLET | ORAL | 0 refills | Status: DC
Start: 2022-03-03 — End: 2022-03-03

## 2022-03-03 NOTE — ED Notes (Signed)
See triage note. Presents with pain to right posterior mid back    non radiating  pain increases with breathing/movement

## 2022-03-03 NOTE — ED Provider Notes (Signed)
[facility] Provider Note  Patient Contact: 5:22 PM (approximate)   History   Back Pain   HPI  Phillip [name] is a 76 y.o. male who presents the emergency department complaining of back pain.  Patient states that he did play a game of golf yesterday, this is not atypical for him but has been having some back pain.  Initially felt like he had pulled something up in his right back.  He placed ice on his back and states that the symptoms became worse.  He became concerned when he was taking a deep breath earlier and had a sharp pain in his back.  He denies this being a pleuritic type pain, substernal chest pain, there is no pain along the anterior part of his chest.  Patient denies any direct trauma to his back or chest.  No GI complaints.  Patient states that he does have a cardiac history and has had blood clots in the past and wanted to ensure that this was nothing more than musculoskeletal.  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 --      Excl. in [place]? --     Most recent vital signs: Vitals:   03/03/22 1637  BP: (!) 148/90  Pulse: 74  Resp: 17  Temp: 98.5 F (36.9 C)  SpO2: 96%     General: Alert and in no acute distress.  Neck: No stridor. No cervical spine tenderness to palpation.  Cardiovascular:  Good peripheral perfusion.  Normal S1 and S2.  No murmurs, rubs, gallops Respiratory: Normal respiratory effort without tachypnea or  retractions. Lungs CTAB. Good air entry to the bases with no decreased or absent breath sounds Gastrointestinal: Bowel sounds 4 quadrants. Soft and nontender to palpation. No guarding or rigidity. No palpable masses. No distention. No CVA tenderness. Musculoskeletal: Full range of motion to all extremities.  No visible signs of trauma to the chest wall or back.  Patient has tenderness to the right paraspinal muscle group extending from T12 region through L3 region.  There is palpable spasm in this area.  There is no midline or left-sided tenderness.  Patient's symptoms are reproducible with movement of this area as well as my palpation. Neurologic:  No gross focal neurologic deficits are appreciated.  Skin:   No rash noted Other:   ED Results / Procedures / Treatments   Labs (all labs ordered are listed, but only abnormal results are displayed) Labs Reviewed - No data to display   EKG     RADIOLOGY    No results found.  PROCEDURES:  Critical Care performed: No  Procedures   MEDICATIONS ORDERED IN ED: Medications  dexamethasone (  DECADRON) injection 10 mg (has no administration in time range)  orphenadrine (NORFLEX) injection 60 mg (has no administration in time range)     IMPRESSION / MDM / ASSESSMENT AND PLAN / ED COURSE  I reviewed the triage vital signs and the nursing notes.                              Differential diagnosis includes, but is not limited to, lumbar strain, rib fracture, compression fracture of the lumbar spine, pneumothorax, PE, STEMI, dissection  Patient's presentation is most consistent with acute illness / injury with system symptoms.   Patient's diagnosis is consistent with muscle strain in the back.  Patient presents to the emergency department complaining of right back pain.  It is worse with movement or positional change.  Patient states that he does have a history of NSTEMI as well as a history of PE in the past and wanted to be sure that  there was nothing other than a musculoskeletal complaint.  He did play golf which is not atypical for him and denied any trauma prior to the onset of symptoms.  Findings on physical exam are consistent with a muscle spasm.  Patient is tender over the paraspinal muscle group with appreciable palpable spasms in this area.  I discussed my differential with the patient which included musculoskeletal as well as PE, STEMI, dissection.  I have very low suspicion for PE, STEMI or dissection given the reassuring vitals, reproducible symptoms.  We discussed full work-up versus treatment.  Patient states that he has had this pain in the past and was treated with a shot from primary care with improvement and prefers to go this route.  Patient states that anytime he has pain in his back or chest he always presents to ensure there was not any other acute finding.  This time I do feel that foregoing work-up is reasonable and we will treat the patient with anti-inflammatory and muscle relaxer.Marland Kitchen  Return precautions are discussed with the patient at length.  Follow-up primary care as needed.  Patient is given ED precautions to return to the ED for any worsening or new symptoms.        FINAL CLINICAL IMPRESSION(S) / ED DIAGNOSES   Final diagnoses:  Muscle strain  Muscle spasm of back     Rx / DC Orders   ED Discharge Orders          Ordered    predniSONE (DELTASONE) 10 MG tablet  As directed       Note to Pharmacy: Take on a pattern of 6, 6, 5, 5, 4, 4, 3, 3, 2, 2, 1, 1   03/03/22 1727    methocarbamol (ROBAXIN) 500 MG tablet  4 times daily        03/03/22 1727             Note:  This document was prepared using Dragon voice recognition software and may include unintentional dictation errors.   Brynda Peon 03/03/22 1728    Lavonia Drafts, MD 03/03/22 [phone]

## 2022-03-03 NOTE — ED Triage Notes (Signed)
Pt to ED for mid R back pain. Pt states has "hx of clots" and is unsure if it could be this. States played golf yesterday, pain started today. Pt does not recall injuring self. Steady gait walking to triage room.  Pt appears tense, grimacing in pain.

## 2022-03-08 DIAGNOSIS — D6859 Other primary thrombophilia: Secondary | ICD-10-CM | POA: Diagnosis not present

## 2022-03-08 DIAGNOSIS — M79605 Pain in left leg: Secondary | ICD-10-CM | POA: Diagnosis not present

## 2022-03-09 DIAGNOSIS — I10 Essential (primary) hypertension: Secondary | ICD-10-CM | POA: Diagnosis not present

## 2022-03-09 DIAGNOSIS — E291 Testicular hypofunction: Secondary | ICD-10-CM | POA: Diagnosis not present

## 2022-03-09 DIAGNOSIS — I2510 Atherosclerotic heart disease of native coronary artery without angina pectoris: Secondary | ICD-10-CM | POA: Diagnosis not present

## 2022-03-09 DIAGNOSIS — K219 Gastro-esophageal reflux disease without esophagitis: Secondary | ICD-10-CM | POA: Diagnosis not present

## 2022-03-09 DIAGNOSIS — N393 Stress incontinence (female) (male): Secondary | ICD-10-CM | POA: Diagnosis not present

## 2022-03-09 DIAGNOSIS — E785 Hyperlipidemia, unspecified: Secondary | ICD-10-CM | POA: Diagnosis not present

## 2022-03-09 DIAGNOSIS — G620 Drug-induced polyneuropathy: Secondary | ICD-10-CM | POA: Diagnosis not present

## 2022-03-09 DIAGNOSIS — E119 Type 2 diabetes mellitus without complications: Secondary | ICD-10-CM | POA: Diagnosis not present

## 2022-03-09 DIAGNOSIS — Z8546 Personal history of malignant neoplasm of prostate: Secondary | ICD-10-CM | POA: Diagnosis not present

## 2022-03-09 DIAGNOSIS — E039 Hypothyroidism, unspecified: Secondary | ICD-10-CM | POA: Diagnosis not present

## 2022-03-10 DIAGNOSIS — E039 Hypothyroidism, unspecified: Secondary | ICD-10-CM | POA: Diagnosis not present

## 2022-03-10 DIAGNOSIS — I2510 Atherosclerotic heart disease of native coronary artery without angina pectoris: Secondary | ICD-10-CM | POA: Diagnosis not present

## 2022-03-10 DIAGNOSIS — N393 Stress incontinence (female) (male): Secondary | ICD-10-CM | POA: Diagnosis not present

## 2022-03-10 DIAGNOSIS — E291 Testicular hypofunction: Secondary | ICD-10-CM | POA: Diagnosis not present

## 2022-03-10 DIAGNOSIS — K219 Gastro-esophageal reflux disease without esophagitis: Secondary | ICD-10-CM | POA: Diagnosis not present

## 2022-03-10 DIAGNOSIS — I10 Essential (primary) hypertension: Secondary | ICD-10-CM | POA: Diagnosis not present

## 2022-03-10 DIAGNOSIS — E785 Hyperlipidemia, unspecified: Secondary | ICD-10-CM | POA: Diagnosis not present

## 2022-03-10 DIAGNOSIS — E119 Type 2 diabetes mellitus without complications: Secondary | ICD-10-CM | POA: Diagnosis not present

## 2022-03-10 DIAGNOSIS — G620 Drug-induced polyneuropathy: Secondary | ICD-10-CM | POA: Diagnosis not present

## 2022-03-11 ENCOUNTER — Encounter
Admission: EM | Disposition: A | Discharge status: Skilled Nursing Facility | Payer: Self-pay | Source: Ambulatory Visit | Attending: Internal Medicine

## 2022-03-11 ENCOUNTER — Emergency Department: Payer: Medicare HMO

## 2022-03-11 ENCOUNTER — Inpatient Hospital Stay
Admission: EM | Admit: 2022-03-11 | Discharge: 2022-03-28 | DRG: 270 | Disposition: A | Discharge status: Skilled Nursing Facility | Payer: Medicare HMO | Source: Ambulatory Visit | Attending: Osteopathic Medicine | Admitting: Osteopathic Medicine

## 2022-03-11 ENCOUNTER — Encounter: Payer: Self-pay | Admitting: Emergency Medicine

## 2022-03-11 DIAGNOSIS — N179 Acute kidney failure, unspecified: Secondary | ICD-10-CM | POA: Diagnosis not present

## 2022-03-11 DIAGNOSIS — I82413 Acute embolism and thrombosis of femoral vein, bilateral: Secondary | ICD-10-CM | POA: Diagnosis not present

## 2022-03-11 DIAGNOSIS — I110 Hypertensive heart disease with heart failure: Secondary | ICD-10-CM | POA: Diagnosis present

## 2022-03-11 DIAGNOSIS — Z79890 Hormone replacement therapy (postmenopausal): Secondary | ICD-10-CM

## 2022-03-11 DIAGNOSIS — I743 Embolism and thrombosis of arteries of the lower extremities: Secondary | ICD-10-CM | POA: Diagnosis present

## 2022-03-11 DIAGNOSIS — N281 Cyst of kidney, acquired: Secondary | ICD-10-CM | POA: Diagnosis not present

## 2022-03-11 DIAGNOSIS — I70222 Atherosclerosis of native arteries of extremities with rest pain, left leg: Secondary | ICD-10-CM

## 2022-03-11 DIAGNOSIS — I70221 Atherosclerosis of native arteries of extremities with rest pain, right leg: Secondary | ICD-10-CM | POA: Diagnosis present

## 2022-03-11 DIAGNOSIS — I2510 Atherosclerotic heart disease of native coronary artery without angina pectoris: Secondary | ICD-10-CM | POA: Diagnosis not present

## 2022-03-11 DIAGNOSIS — Z95828 Presence of other vascular implants and grafts: Secondary | ICD-10-CM | POA: Diagnosis not present

## 2022-03-11 DIAGNOSIS — D72828 Other elevated white blood cell count: Secondary | ICD-10-CM | POA: Diagnosis not present

## 2022-03-11 DIAGNOSIS — Z7189 Other specified counseling: Secondary | ICD-10-CM

## 2022-03-11 DIAGNOSIS — G4700 Insomnia, unspecified: Secondary | ICD-10-CM | POA: Diagnosis present

## 2022-03-11 DIAGNOSIS — I5032 Chronic diastolic (congestive) heart failure: Secondary | ICD-10-CM | POA: Diagnosis not present

## 2022-03-11 DIAGNOSIS — D6859 Other primary thrombophilia: Secondary | ICD-10-CM | POA: Diagnosis present

## 2022-03-11 DIAGNOSIS — E1152 Type 2 diabetes mellitus with diabetic peripheral angiopathy with gangrene: Principal | ICD-10-CM | POA: Diagnosis present

## 2022-03-11 DIAGNOSIS — E1142 Type 2 diabetes mellitus with diabetic polyneuropathy: Secondary | ICD-10-CM | POA: Diagnosis not present

## 2022-03-11 DIAGNOSIS — R7989 Other specified abnormal findings of blood chemistry: Secondary | ICD-10-CM | POA: Diagnosis not present

## 2022-03-11 DIAGNOSIS — F419 Anxiety disorder, unspecified: Secondary | ICD-10-CM | POA: Diagnosis not present

## 2022-03-11 DIAGNOSIS — E785 Hyperlipidemia, unspecified: Secondary | ICD-10-CM | POA: Diagnosis present

## 2022-03-11 DIAGNOSIS — Z66 Do not resuscitate: Secondary | ICD-10-CM | POA: Diagnosis not present

## 2022-03-11 DIAGNOSIS — E039 Hypothyroidism, unspecified: Secondary | ICD-10-CM | POA: Diagnosis present

## 2022-03-11 DIAGNOSIS — Z8551 Personal history of malignant neoplasm of bladder: Secondary | ICD-10-CM | POA: Diagnosis not present

## 2022-03-11 DIAGNOSIS — I130 Hypertensive heart and chronic kidney disease with heart failure and stage 1 through stage 4 chronic kidney disease, or unspecified chronic kidney disease: Secondary | ICD-10-CM | POA: Diagnosis not present

## 2022-03-11 DIAGNOSIS — K219 Gastro-esophageal reflux disease without esophagitis: Secondary | ICD-10-CM | POA: Diagnosis present

## 2022-03-11 DIAGNOSIS — Z955 Presence of coronary angioplasty implant and graft: Secondary | ICD-10-CM

## 2022-03-11 DIAGNOSIS — I700 Atherosclerosis of aorta: Secondary | ICD-10-CM | POA: Diagnosis not present

## 2022-03-11 DIAGNOSIS — I2699 Other pulmonary embolism without acute cor pulmonale: Secondary | ICD-10-CM

## 2022-03-11 DIAGNOSIS — I480 Paroxysmal atrial fibrillation: Secondary | ICD-10-CM | POA: Diagnosis present

## 2022-03-11 DIAGNOSIS — R5082 Postprocedural fever: Secondary | ICD-10-CM | POA: Diagnosis not present

## 2022-03-11 DIAGNOSIS — N393 Stress incontinence (female) (male): Secondary | ICD-10-CM | POA: Diagnosis present

## 2022-03-11 DIAGNOSIS — I82421 Acute embolism and thrombosis of right iliac vein: Secondary | ICD-10-CM | POA: Diagnosis not present

## 2022-03-11 DIAGNOSIS — I25110 Atherosclerotic heart disease of native coronary artery with unstable angina pectoris: Secondary | ICD-10-CM | POA: Diagnosis not present

## 2022-03-11 DIAGNOSIS — M79662 Pain in left lower leg: Secondary | ICD-10-CM | POA: Diagnosis present

## 2022-03-11 DIAGNOSIS — D75838 Other thrombocytosis: Secondary | ICD-10-CM

## 2022-03-11 DIAGNOSIS — I471 Supraventricular tachycardia, unspecified: Secondary | ICD-10-CM

## 2022-03-11 DIAGNOSIS — L97829 Non-pressure chronic ulcer of other part of left lower leg with unspecified severity: Secondary | ICD-10-CM | POA: Diagnosis present

## 2022-03-11 DIAGNOSIS — Z79899 Other long term (current) drug therapy: Secondary | ICD-10-CM | POA: Diagnosis not present

## 2022-03-11 DIAGNOSIS — N189 Chronic kidney disease, unspecified: Secondary | ICD-10-CM | POA: Diagnosis not present

## 2022-03-11 DIAGNOSIS — I252 Old myocardial infarction: Secondary | ICD-10-CM | POA: Diagnosis not present

## 2022-03-11 DIAGNOSIS — I70262 Atherosclerosis of native arteries of extremities with gangrene, left leg: Secondary | ICD-10-CM | POA: Diagnosis not present

## 2022-03-11 DIAGNOSIS — M79605 Pain in left leg: Secondary | ICD-10-CM | POA: Diagnosis not present

## 2022-03-11 DIAGNOSIS — I82812 Embolism and thrombosis of superficial veins of left lower extremities: Secondary | ICD-10-CM | POA: Diagnosis not present

## 2022-03-11 DIAGNOSIS — I82411 Acute embolism and thrombosis of right femoral vein: Secondary | ICD-10-CM | POA: Diagnosis not present

## 2022-03-11 DIAGNOSIS — I5033 Acute on chronic diastolic (congestive) heart failure: Secondary | ICD-10-CM

## 2022-03-11 DIAGNOSIS — J9601 Acute respiratory failure with hypoxia: Secondary | ICD-10-CM

## 2022-03-11 DIAGNOSIS — D68311 Acquired hemophilia: Secondary | ICD-10-CM | POA: Diagnosis present

## 2022-03-11 DIAGNOSIS — I4891 Unspecified atrial fibrillation: Secondary | ICD-10-CM | POA: Diagnosis not present

## 2022-03-11 DIAGNOSIS — Z89512 Acquired absence of left leg below knee: Secondary | ICD-10-CM | POA: Diagnosis not present

## 2022-03-11 DIAGNOSIS — Z4781 Encounter for orthopedic aftercare following surgical amputation: Secondary | ICD-10-CM | POA: Diagnosis not present

## 2022-03-11 DIAGNOSIS — R9431 Abnormal electrocardiogram [ECG] [EKG]: Secondary | ICD-10-CM | POA: Diagnosis not present

## 2022-03-11 DIAGNOSIS — Z743 Need for continuous supervision: Secondary | ICD-10-CM | POA: Diagnosis not present

## 2022-03-11 DIAGNOSIS — I82432 Acute embolism and thrombosis of left popliteal vein: Secondary | ICD-10-CM | POA: Diagnosis not present

## 2022-03-11 DIAGNOSIS — I7090 Unspecified atherosclerosis: Secondary | ICD-10-CM | POA: Diagnosis not present

## 2022-03-11 DIAGNOSIS — I82461 Acute embolism and thrombosis of right calf muscular vein: Secondary | ICD-10-CM | POA: Diagnosis not present

## 2022-03-11 DIAGNOSIS — R0603 Acute respiratory distress: Secondary | ICD-10-CM | POA: Diagnosis not present

## 2022-03-11 DIAGNOSIS — Z89522 Acquired absence of left knee: Secondary | ICD-10-CM | POA: Diagnosis not present

## 2022-03-11 DIAGNOSIS — Z8249 Family history of ischemic heart disease and other diseases of the circulatory system: Secondary | ICD-10-CM

## 2022-03-11 DIAGNOSIS — R5381 Other malaise: Secondary | ICD-10-CM | POA: Diagnosis not present

## 2022-03-11 DIAGNOSIS — I70248 Atherosclerosis of native arteries of left leg with ulceration of other part of lower left leg: Secondary | ICD-10-CM | POA: Diagnosis not present

## 2022-03-11 DIAGNOSIS — I10 Essential (primary) hypertension: Secondary | ICD-10-CM | POA: Diagnosis not present

## 2022-03-11 DIAGNOSIS — Z86718 Personal history of other venous thrombosis and embolism: Secondary | ICD-10-CM

## 2022-03-11 DIAGNOSIS — I255 Ischemic cardiomyopathy: Secondary | ICD-10-CM | POA: Diagnosis present

## 2022-03-11 DIAGNOSIS — N183 Chronic kidney disease, stage 3 unspecified: Secondary | ICD-10-CM | POA: Diagnosis not present

## 2022-03-11 DIAGNOSIS — Z7902 Long term (current) use of antithrombotics/antiplatelets: Secondary | ICD-10-CM

## 2022-03-11 DIAGNOSIS — I350 Nonrheumatic aortic (valve) stenosis: Secondary | ICD-10-CM | POA: Diagnosis not present

## 2022-03-11 DIAGNOSIS — Z48812 Encounter for surgical aftercare following surgery on the circulatory system: Secondary | ICD-10-CM | POA: Diagnosis not present

## 2022-03-11 DIAGNOSIS — I2693 Single subsegmental pulmonary embolism without acute cor pulmonale: Secondary | ICD-10-CM | POA: Diagnosis not present

## 2022-03-11 DIAGNOSIS — Z86711 Personal history of pulmonary embolism: Secondary | ICD-10-CM

## 2022-03-11 DIAGNOSIS — R0602 Shortness of breath: Secondary | ICD-10-CM | POA: Diagnosis not present

## 2022-03-11 DIAGNOSIS — Z8546 Personal history of malignant neoplasm of prostate: Secondary | ICD-10-CM | POA: Diagnosis not present

## 2022-03-11 DIAGNOSIS — Z85828 Personal history of other malignant neoplasm of skin: Secondary | ICD-10-CM

## 2022-03-11 DIAGNOSIS — I82220 Acute embolism and thrombosis of inferior vena cava: Secondary | ICD-10-CM | POA: Diagnosis not present

## 2022-03-11 DIAGNOSIS — Z7984 Long term (current) use of oral hypoglycemic drugs: Secondary | ICD-10-CM

## 2022-03-11 DIAGNOSIS — K5909 Other constipation: Secondary | ICD-10-CM | POA: Diagnosis present

## 2022-03-11 DIAGNOSIS — Z7901 Long term (current) use of anticoagulants: Secondary | ICD-10-CM

## 2022-03-11 DIAGNOSIS — D649 Anemia, unspecified: Secondary | ICD-10-CM | POA: Diagnosis not present

## 2022-03-11 DIAGNOSIS — I2609 Other pulmonary embolism with acute cor pulmonale: Secondary | ICD-10-CM | POA: Diagnosis not present

## 2022-03-11 DIAGNOSIS — Z9079 Acquired absence of other genital organ(s): Secondary | ICD-10-CM

## 2022-03-11 DIAGNOSIS — R54 Age-related physical debility: Secondary | ICD-10-CM | POA: Diagnosis not present

## 2022-03-11 DIAGNOSIS — J9811 Atelectasis: Secondary | ICD-10-CM | POA: Diagnosis not present

## 2022-03-11 DIAGNOSIS — Z7982 Long term (current) use of aspirin: Secondary | ICD-10-CM

## 2022-03-11 HISTORY — PX: LOWER EXTREMITY ANGIOGRAPHY: CATH118251

## 2022-03-11 LAB — COMPREHENSIVE METABOLIC PANEL
ALT: 25 U/L (ref 0–44)
AST: 33 U/L (ref 15–41)
Albumin: 3.8 g/dL (ref 3.5–5.0)
Alkaline Phosphatase: 48 U/L (ref 38–126)
Anion gap: 7 (ref 5–15)
BUN: 25 mg/dL — ABNORMAL HIGH (ref 8–23)
CO2: 25 mmol/L (ref 22–32)
Calcium: 9.2 mg/dL (ref 8.9–10.3)
Chloride: 104 mmol/L (ref 98–111)
Creatinine, Ser: 1.4 mg/dL — ABNORMAL HIGH (ref 0.61–1.24)
GFR, Estimated: 52 mL/min — ABNORMAL LOW (ref >60)
Glucose, Bld: 133 mg/dL — ABNORMAL HIGH (ref 70–99)
Potassium: 4.3 mmol/L (ref 3.5–5.1)
Sodium: 136 mmol/L (ref 135–145)
Total Bilirubin: 0.9 mg/dL (ref 0.3–1.2)
Total Protein: 7.1 g/dL (ref 6.5–8.1)

## 2022-03-11 LAB — CBC WITH DIFFERENTIAL/PLATELET
Abs Immature Granulocytes: 0.09 10*3/uL — ABNORMAL HIGH (ref 0.00–0.07)
Basophils Absolute: 0.1 10*3/uL (ref 0.0–0.1)
Basophils Relative: 1 %
Eosinophils Absolute: 0.3 10*3/uL (ref 0.0–0.5)
Eosinophils Relative: 2 %
HCT: 53.1 % — ABNORMAL HIGH (ref 39.0–52.0)
Hemoglobin: 16.2 g/dL (ref 13.0–17.0)
Immature Granulocytes: 1 %
Lymphocytes Relative: 14 %
Lymphs Abs: 2 10*3/uL (ref 0.7–4.0)
MCH: 26.9 pg (ref 26.0–34.0)
MCHC: 30.5 g/dL (ref 30.0–36.0)
MCV: 88.2 fL (ref 80.0–100.0)
Monocytes Absolute: 1.2 10*3/uL — ABNORMAL HIGH (ref 0.1–1.0)
Monocytes Relative: 9 %
Neutro Abs: 10.6 10*3/uL — ABNORMAL HIGH (ref 1.7–7.7)
Neutrophils Relative %: 73 %
Platelets: 432 10*3/uL — ABNORMAL HIGH (ref 150–400)
RBC: 6.02 MIL/uL — ABNORMAL HIGH (ref 4.22–5.81)
RDW: 15.9 % — ABNORMAL HIGH (ref 11.5–15.5)
WBC: 14.4 10*3/uL — ABNORMAL HIGH (ref 4.0–10.5)
nRBC: 0 % (ref 0.0–0.2)

## 2022-03-11 LAB — GLUCOSE, CAPILLARY
Glucose-Capillary: 153 mg/dL — ABNORMAL HIGH (ref 70–99)
Glucose-Capillary: 215 mg/dL — ABNORMAL HIGH (ref 70–99)

## 2022-03-11 LAB — HEPARIN LEVEL (UNFRACTIONATED): Heparin Unfractionated: 0.14 IU/mL — ABNORMAL LOW (ref 0.30–0.70)

## 2022-03-11 LAB — PROTIME-INR
INR: 1.2 (ref 0.8–1.2)
Prothrombin Time: 14.7 seconds (ref 11.4–15.2)

## 2022-03-11 LAB — HEMOGLOBIN A1C
Hgb A1c MFr Bld: 6.4 % — ABNORMAL HIGH (ref 4.8–5.6)
Mean Plasma Glucose: 136.98 mg/dL

## 2022-03-11 LAB — APTT: aPTT: 33 seconds (ref 24–36)

## 2022-03-11 LAB — MRSA NEXT GEN BY PCR, NASAL: MRSA by PCR Next Gen: NOT DETECTED

## 2022-03-11 SURGERY — LOWER EXTREMITY ANGIOGRAPHY
Anesthesia: Moderate Sedation | Laterality: Left

## 2022-03-11 MED ORDER — MELATONIN 5 MG PO TABS: 5.0000 mg | ORAL_TABLET | Freq: Every evening | ORAL | Status: DC | PRN

## 2022-03-11 MED ORDER — ACETAMINOPHEN 325 MG PO TABS
650.0000 mg | ORAL_TABLET | Freq: Four times a day (QID) | ORAL | Status: DC | PRN
Start: 2022-03-11 — End: 2022-03-11

## 2022-03-11 MED ORDER — MELATONIN 5 MG PO TABS
5.0000 mg | ORAL_TABLET | Freq: Every day | ORAL | Status: DC
  Administered 2022-03-11 – 2022-03-24 (×13): 5 mg via ORAL
  Filled 2022-03-11 (×16): qty 1

## 2022-03-11 MED ORDER — OXYCODONE HCL 5 MG PO TABS: 5.0000 mg | ORAL_TABLET | ORAL | Status: DC | PRN

## 2022-03-11 MED ORDER — FENTANYL CITRATE PF 50 MCG/ML IJ SOSY
50.0000 ug | PREFILLED_SYRINGE | Freq: Once | INTRAMUSCULAR | Status: AC
  Administered 2022-03-11: 50 ug via INTRAVENOUS
  Filled 2022-03-11: qty 1

## 2022-03-11 MED ORDER — HYDROMORPHONE HCL 1 MG/ML IJ SOLN: 0.5000 mg | INTRAMUSCULAR | Status: DC | PRN

## 2022-03-11 MED ORDER — NITROGLYCERIN 1 MG/10 ML FOR IR/CATH LAB
INTRA_ARTERIAL | Status: AC
  Filled 2022-03-11: qty 10

## 2022-03-11 MED ORDER — SODIUM CHLORIDE 0.9 % IV SOLN: 250.0000 mL | INTRAVENOUS | Status: DC | PRN

## 2022-03-11 MED ORDER — SODIUM CHLORIDE 0.9% FLUSH
3.0000 mL | Freq: Two times a day (BID) | INTRAVENOUS | Status: DC
  Administered 2022-03-11 – 2022-03-28 (×29): 3 mL via INTRAVENOUS

## 2022-03-11 MED ORDER — NITROGLYCERIN 1 MG/10 ML FOR IR/CATH LAB
INTRA_ARTERIAL | Status: DC | PRN
  Administered 2022-03-11: 500 ug via INTRA_ARTERIAL
  Administered 2022-03-11: 300 ug via INTRA_ARTERIAL

## 2022-03-11 MED ORDER — HEPARIN (PORCINE) 25000 UT/250ML-% IV SOLN
1300.0000 [IU]/h | INTRAVENOUS | Status: DC
  Administered 2022-03-12: 1300 [IU]/h via INTRAVENOUS

## 2022-03-11 MED ORDER — TIROFIBAN (AGGRASTAT) BOLUS VIA INFUSION: 25.0000 ug/kg | INTRAVENOUS | Status: DC

## 2022-03-11 MED ORDER — HEPARIN BOLUS VIA INFUSION
2000.0000 [IU] | Freq: Once | INTRAVENOUS | Status: AC
Start: 2022-03-11 — End: 2022-03-11
  Administered 2022-03-11: 2000 [IU] via INTRAVENOUS
  Filled 2022-03-11: qty 2000

## 2022-03-11 MED ORDER — MIDAZOLAM HCL 2 MG/2ML IJ SOLN
INTRAMUSCULAR | Status: DC | PRN
  Administered 2022-03-11: 2 mg via INTRAVENOUS

## 2022-03-11 MED ORDER — POLYETHYLENE GLYCOL 3350 17 G PO PACK: 17.0000 g | PACK | Freq: Every day | ORAL | Status: DC | PRN

## 2022-03-11 MED ORDER — SODIUM CHLORIDE 0.9 % IV SOLN
INTRAVENOUS | Status: AC
Start: 2022-03-11 — End: 2022-03-12

## 2022-03-11 MED ORDER — ACETAMINOPHEN 325 MG PO TABS: 650.0000 mg | ORAL_TABLET | ORAL | Status: DC | PRN

## 2022-03-11 MED ORDER — ONDANSETRON HCL 4 MG/2ML IJ SOLN: 4.0000 mg | Freq: Four times a day (QID) | INTRAMUSCULAR | Status: DC | PRN

## 2022-03-11 MED ORDER — IODIXANOL 320 MG/ML IV SOLN
INTRAVENOUS | Status: DC | PRN
  Administered 2022-03-11: 70 mL

## 2022-03-11 MED ORDER — CEFAZOLIN SODIUM-DEXTROSE 2-4 GM/100ML-% IV SOLN
INTRAVENOUS | Status: AC
  Administered 2022-03-11: 2 g via INTRAVENOUS
  Filled 2022-03-11: qty 100

## 2022-03-11 MED ORDER — MIDAZOLAM HCL 2 MG/2ML IJ SOLN
INTRAMUSCULAR | Status: AC
  Filled 2022-03-11: qty 2

## 2022-03-11 MED ORDER — HEPARIN (PORCINE) 25000 UT/250ML-% IV SOLN
1300.0000 [IU]/h | INTRAVENOUS | Status: DC
  Administered 2022-03-11: 1300 [IU]/h via INTRAVENOUS
  Filled 2022-03-11: qty 250

## 2022-03-11 MED ORDER — FENTANYL CITRATE (PF) 100 MCG/2ML IJ SOLN
INTRAMUSCULAR | Status: DC | PRN
  Administered 2022-03-11: 50 ug via INTRAVENOUS

## 2022-03-11 MED ORDER — LEVOTHYROXINE SODIUM 50 MCG PO TABS: 50.0000 ug | ORAL_TABLET | Freq: Every day | ORAL | Status: DC

## 2022-03-11 MED ORDER — ORAL CARE MOUTH RINSE: 15.0000 mL | OROMUCOSAL | Status: DC | PRN

## 2022-03-11 MED ORDER — LACTATED RINGERS IV SOLN: INTRAVENOUS | Status: DC

## 2022-03-11 MED ORDER — LABETALOL HCL 5 MG/ML IV SOLN
10.0000 mg | INTRAVENOUS | Status: DC | PRN
  Administered 2022-03-12: 10 mg via INTRAVENOUS
  Filled 2022-03-11: qty 4

## 2022-03-11 MED ORDER — TIROFIBAN HCL IV 12.5 MG/250 ML
INTRAVENOUS | Status: AC
  Administered 2022-03-11: 1940 ug via INTRAVENOUS
  Filled 2022-03-11: qty 250

## 2022-03-11 MED ORDER — TIROFIBAN HCL IV 12.5 MG/250 ML: 0.0750 ug/kg/min | INTRAVENOUS | Status: DC

## 2022-03-11 MED ORDER — CEFAZOLIN SODIUM-DEXTROSE 2-4 GM/100ML-% IV SOLN: 2.0000 g | INTRAVENOUS | Status: AC

## 2022-03-11 MED ORDER — HEPARIN SODIUM (PORCINE) 1000 UNIT/ML IJ SOLN
INTRAMUSCULAR | Status: AC
  Filled 2022-03-11: qty 10

## 2022-03-11 MED ORDER — INSULIN ASPART 100 UNIT/ML IJ SOLN
0.0000 [IU] | Freq: Three times a day (TID) | INTRAMUSCULAR | Status: DC
  Administered 2022-03-12 – 2022-03-13 (×2): 2 [IU] via SUBCUTANEOUS
  Administered 2022-03-13: 3 [IU] via SUBCUTANEOUS
  Administered 2022-03-13 – 2022-03-15 (×3): 2 [IU] via SUBCUTANEOUS
  Administered 2022-03-15 (×2): 3 [IU] via SUBCUTANEOUS
  Administered 2022-03-16 (×2): 2 [IU] via SUBCUTANEOUS
  Administered 2022-03-17 (×2): 3 [IU] via SUBCUTANEOUS
  Administered 2022-03-17: 5 [IU] via SUBCUTANEOUS
  Administered 2022-03-18: 3 [IU] via SUBCUTANEOUS
  Administered 2022-03-18 – 2022-03-19 (×2): 2 [IU] via SUBCUTANEOUS
  Administered 2022-03-20: 3 [IU] via SUBCUTANEOUS
  Administered 2022-03-20: 2 [IU] via SUBCUTANEOUS
  Administered 2022-03-21 (×2): 3 [IU] via SUBCUTANEOUS
  Administered 2022-03-21 – 2022-03-23 (×5): 2 [IU] via SUBCUTANEOUS
  Administered 2022-03-24: 3 [IU] via SUBCUTANEOUS
  Administered 2022-03-24: 2 [IU] via SUBCUTANEOUS
  Administered 2022-03-24: 8 [IU] via SUBCUTANEOUS
  Administered 2022-03-25 – 2022-03-26 (×4): 3 [IU] via SUBCUTANEOUS
  Administered 2022-03-26: 2 [IU] via SUBCUTANEOUS
  Administered 2022-03-26 – 2022-03-27 (×2): 3 [IU] via SUBCUTANEOUS
  Administered 2022-03-27: 2 [IU] via SUBCUTANEOUS
  Administered 2022-03-28: 3 [IU] via SUBCUTANEOUS
  Filled 2022-03-11 (×37): qty 1

## 2022-03-11 MED ORDER — PRAVASTATIN SODIUM 20 MG PO TABS: 40.0000 mg | ORAL_TABLET | Freq: Every day | ORAL | Status: DC

## 2022-03-11 MED ORDER — PANTOPRAZOLE SODIUM 40 MG PO TBEC: 40.0000 mg | DELAYED_RELEASE_TABLET | Freq: Every day | ORAL | Status: DC

## 2022-03-11 MED ORDER — MORPHINE SULFATE (PF) 4 MG/ML IV SOLN
2.0000 mg | INTRAVENOUS | Status: DC | PRN
  Administered 2022-03-12 – 2022-03-17 (×3): 2 mg via INTRAVENOUS
  Filled 2022-03-11 (×3): qty 1

## 2022-03-11 MED ORDER — SODIUM CHLORIDE 0.9% FLUSH: 3.0000 mL | INTRAVENOUS | Status: DC | PRN

## 2022-03-11 MED ORDER — IOHEXOL 350 MG/ML SOLN
100.0000 mL | Freq: Once | INTRAVENOUS | Status: AC | PRN
  Administered 2022-03-11: 100 mL via INTRAVENOUS

## 2022-03-11 MED ORDER — FENTANYL CITRATE PF 50 MCG/ML IJ SOSY
PREFILLED_SYRINGE | INTRAMUSCULAR | Status: AC
  Filled 2022-03-11: qty 1

## 2022-03-11 MED ORDER — HYDRALAZINE HCL 20 MG/ML IJ SOLN: 5.0000 mg | INTRAMUSCULAR | Status: DC | PRN

## 2022-03-11 MED ORDER — TIROFIBAN (AGGRASTAT) BOLUS VIA INFUSION
25.0000 ug/kg | Freq: Once | INTRAVENOUS | Status: AC
  Filled 2022-03-11: qty 39

## 2022-03-11 MED ORDER — CEFAZOLIN SODIUM-DEXTROSE 2-4 GM/100ML-% IV SOLN
INTRAVENOUS | Status: AC
  Filled 2022-03-11: qty 100

## 2022-03-11 MED ORDER — LACTATED RINGERS IV BOLUS
500.0000 mL | Freq: Once | INTRAVENOUS | Status: AC
  Administered 2022-03-11: 500 mL via INTRAVENOUS

## 2022-03-11 MED ORDER — OXYCODONE HCL 5 MG PO TABS
5.0000 mg | ORAL_TABLET | ORAL | Status: DC | PRN
  Administered 2022-03-12: 10 mg via ORAL
  Filled 2022-03-11 (×2): qty 2

## 2022-03-11 MED ORDER — HEPARIN SODIUM (PORCINE) 1000 UNIT/ML IJ SOLN
INTRAMUSCULAR | Status: DC | PRN
  Administered 2022-03-11: 3000 [IU] via INTRAVENOUS

## 2022-03-11 MED ORDER — METOPROLOL TARTRATE 25 MG PO TABS: 12.5000 mg | ORAL_TABLET | Freq: Two times a day (BID) | ORAL | Status: DC

## 2022-03-11 MED ORDER — CHLORHEXIDINE GLUCONATE CLOTH 2 % EX PADS
6.0000 | MEDICATED_PAD | Freq: Every day | CUTANEOUS | Status: DC
  Administered 2022-03-11 – 2022-03-19 (×8): 6 via TOPICAL

## 2022-03-11 SURGICAL SUPPLY — 26 items
BALLN LUTONIX 018 4X100X130 (BALLOONS) ×2
BALLN LUTONIX 018 5X80X130 (BALLOONS) ×2
BALLN ULTRVRSE 2X150X150 (BALLOONS) ×2
BALLN ULTRVRSE 2X150X150 OTW (BALLOONS) ×1
BALLOON LUTONIX 018 4X100X130 (BALLOONS) IMPLANT
BALLOON LUTONIX 018 5X80X130 (BALLOONS) IMPLANT
BALLOON ULTRVRSE 2X150X150 OTW (BALLOONS) IMPLANT
CATH ANGIO 5F PIGTAIL 65CM (CATHETERS) ×1 IMPLANT
CATH NAVICROSS ANGLED 135CM (MICROCATHETER) ×1 IMPLANT
CATH ROTAREX 135 6FR (CATHETERS) ×1 IMPLANT
CATH VERT 5FR 125CM (CATHETERS) ×1 IMPLANT
DEVICE SAFEGUARD 24CM (GAUZE/BANDAGES/DRESSINGS) ×1 IMPLANT
DEVICE STARCLOSE SE CLOSURE (Vascular Products) ×1 IMPLANT
GLIDEWIRE ADV .035X260CM (WIRE) ×1 IMPLANT
KIT ENCORE 26 ADVANTAGE (KITS) ×1 IMPLANT
NDL ENTRY 21GA 7CM ECHOTIP (NEEDLE) IMPLANT
NEEDLE ENTRY 21GA 7CM ECHOTIP (NEEDLE) ×2 IMPLANT
PACK ANGIOGRAPHY (CUSTOM PROCEDURE TRAY) ×2 IMPLANT
SET INTRO CAPELLA COAXIAL (SET/KITS/TRAYS/PACK) ×1 IMPLANT
SHEATH BRITE TIP 5FRX11 (SHEATH) ×1 IMPLANT
SHEATH RAABE 6FR (SHEATH) ×1 IMPLANT
STENT LIFESTENT 5F 6X150X135 (Permanent Stent) ×1 IMPLANT
SYR MEDRAD MARK 7 150ML (SYRINGE) ×1 IMPLANT
TUBING CONTRAST HIGH PRESS 72 (TUBING) ×1 IMPLANT
WIRE G V18X300CM (WIRE) ×1 IMPLANT
WIRE GUIDERIGHT .035X150 (WIRE) ×1 IMPLANT

## 2022-03-11 NOTE — Op Note (Signed)
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  Post-operative diagnosis:  Same  Procedure(s) Performed:             1.  Introduction catheter into third lower extremity 3rd order catheter placement with additional third order catheter placement             2.    Contrast injection left lower extremity for distal runoff             3.  Percutaneous transluminal angioplasty and stent placement left superficial femoral and popliteal arteries to 5 mm proximally and 4 mm distally             4.  Mechanical thrombectomy of the left superficial femoral and popliteal arteries using the [name] thrombectomy catheter             5.  Percutaneous transluminal angioplasty left posterior tibial artery to 2 mm             6.  Star close closure right common femoral arteriotomy  Anesthesia: Conscious sedation was administered under my direct supervision by the interventional radiology RN. IV Versed plus fentanyl were utilized. Continuous ECG, pulse oximetry and blood pressure was monitored throughout the entire procedure.  Conscious sedation was for a total of 56 minutes and 37 seconds.  Sheath: 6 [place] Rabie right common femoral retrograde  Contrast: 70 cc  Fluoroscopy Time: 9.9 minutes  Indications:  Phillip [name] presents with increasing pain of the left lower extremity.  Patient recently had a urological surgery and had stopped all of his antiplatelet therapy and anticoagulation.  He noted the symptoms began several days ago but today took a turn for the worst and became unbearable.  In the emergency room he was evaluated and noted to have a cool pulseless foot.  CT angiography was obtained which demonstrated occlusive disease but not necessarily calcific at the distal SFA  and throughout the popliteal and tibial vessels.  This suggests the patient is having limb threatening ischemia. The risks and benefits are reviewed all questions answered patient agrees to proceed.  Procedure:   Phillip [name] is a 76 y.o. y.o. male who was identified and appropriate procedural time out was performed.  The patient was then placed supine on the table and prepped and draped in the usual sterile fashion.    Ultrasound was placed in the sterile sleeve and the right groin was evaluated the right common femoral artery was echolucent and pulsatile indicating patency.  Image was recorded for the permanent record and under real-time visualization a microneedle was inserted into the common femoral artery followed by the microwire and then the micro-sheath.  A J-wire was then advanced through the micro-sheath and a  5 [place] sheath was then inserted over a J-wire. J-wire was then advanced and a 5 French pigtail catheter was positioned at the level of T12.  AP projection of the aorta was then obtained. Pigtail catheter was repositioned to above the bifurcation and a RAO view of the pelvis was obtained.  Subsequently a pigtail catheter with an Advantage wire was used to cross the aortic bifurcation.  The catheter and wire were advanced down into the left distal external iliac artery. Oblique view of the femoral bifurcation was then obtained and subsequently the wire was reintroduced and the pigtail  catheter negotiated into the SFA representing third order catheter placement. Distal runoff was then performed.  5000 units of heparin was then given and allowed to circulate for several minutes.  A 6 French Rabie sheath was advanced up and over the bifurcation and positioned in the femoral artery  KMP catheter and the advantage Glidewire was exchanged for a V18 wire which was negotiated into the posterior tibial artery.  The [name] catheter was then prepped on the field and advanced to the proximal  lesion noted in the distal SFA.  Using the thrombectomy catheter multiple passes were made across both of the dominant lesions identified.  A follow-up image demonstrated greater than 50% residual stenosis but significant improvement with excellent luminal gain..  A 6 mm x 150 mm life stent was then deployed covering both lesions.  A 4 mm x 100 mm Lutonix drug-eluting balloon was used to angioplasty the distal portion of the stent essentially in the popliteal and a 5 mm x 80 mm Lutonix drug-eluting balloon was used to treat the proximal portion of the stent.  The inflation was for 1 minute at 8 atm. Follow-up imaging demonstrated patency with less than 10% residual stenosis and the detector was repositioned to begin treatment of the posterior tibial artery.  Repeated the angiography at the level of the trifurcation looking to see if there is reconstitution distally of any of the tibial vessels.  I saw absolutely no filling distally even of the dorsalis pedis plantar arteries or the pedal arch.  Given this rather grave finding I elected to advance a [name] cross catheter all the way down below the ankle where hand-injection of contrast demonstrated the lateral plantar artery was patent and did appear to fill the pedal arch.  I reintroduced the V18 wire and then advanced a 2 mm x 150 mm Ultraverse balloon into the posterior tibial artery.  2 serial inflations were made.  Inflation was to 10 atmospheres for 1 minute. Follow-up imaging demonstrated restoration of flow however there still appeared to be soft likely thrombotic material present.  300 mics of nitroglycerin was then infused at this level.  I then pulled the catheter back and negotiated the wire catheter into the anterior tibial which appears to occlude abruptly consistent with thromboembolism just above the ankle.  Unfortunately here even with the catheter in the dorsalis pedis there was never reconstitution of the lumen and therefore I did not feel that  angioplasty would be effective within the anterior tibial and elected to terminate the case.  Prior to pulling the sheath back I infused 400 mics more of nitroglycerin.  After review of these images the sheath is pulled into the right external iliac oblique of the common femoral is obtained and a Star close device deployed. There no immediate Complications.  Findings: It should be noted that the findings on the CT scan were incomplete especially with respect to the distal runoff bilaterally.  The abdominal aorta is opacified with a bolus injection contrast. Renal arteries are single and widely patent without evidence of hemodynamically significant stenosis.  The aorta itself has minimal disease and no hemodynamically significant lesions. The common and external iliac arteries are widely patent bilaterally.  The left common femoral is widely patent as is the profunda femoris.  The SFA does indeed have a significant stenosis distally which appears to be a combination of calcific disease and possibly softer thrombus there are 2 tandem lesions the first is within the distal SFA and is greater than  90% actually it is really subtotally occlusive and extends over 30 to 40 mm and then in the mid popliteal a very similar looking lesion exists again greater than 90% extending over 30 to 40 mm.  The distal popliteal demonstrates increasing calcific disease but there are no focal hemodynamically significant lesions.  The trifurcation is diffusely diseased with atherosclerotic changes but there are no focal flow-limiting lesions noted however, there is occlusion of the anterior tibial, peroneal and posterior tibial arteries and their mid to distal portions and on initial imaging there is no reconstitution or filling within the foot proper.  Later in the case as described above I demonstrated that the lateral plantar artery is still patent and appears to fill the pedal arch.  Dorsalis pedis appears to be occluded as well.   Given the diffuse nature and abrupt change this has characteristics of thromboembolism.    Following thrombectomy with the [place] Rex catheter in the SFA and popliteal there is now excellent luminal gain and reestablishment of flow.  Following angioplasty and stent placement there is now less than 10% residual stenosis throughout the SFA popliteal and rapid filling of the trifurcation.  Following angioplasty the posterior tibial now is in-line flow and appears to be the dominant flow to the foot.  Following catheter selection of the anterior tibial I do not see that there is reconstitution distally and therefore did not move forward with angioplasty.  Patient will be maintained on Aggrenox overnight as this will likely help resolve any residual thrombotic material  Summary: Successful recanalization left lower extremity for limb salvage                           Disposition: Patient was taken to the recovery room in stable condition having tolerated the procedure well.  Levora Dredge 03/11/2022,7:40 PM

## 2022-03-11 NOTE — H&P (Signed)
History and Physical  Phillip [name] ZOX:[id number] DOB: [birth year] DOA: 03/11/2022  Referring physician: Dr. Michiel Sites, EDP  PCP: Danella Penton, MD  Outpatient Specialists: Cardiology, urology. Patient coming from: Home through PCPs office  Chief Complaint: Left leg pain  HPI: Phillip [name] is a 76 y.o. male with medical history significant for recurrent DVTs, post operative PE on Eliquis, hypertension, hyperlipidemia, nephrolithiasis, malignant neoplasm of prostate post resection, osteoarthritis, coronary artery disease status post PCI with stenting on Plavix, chronic diastolic CHF, who presents to [facility] ED from his PCP's office, Dr. Hyacinth Meeker, due to concern for critical left lower limb ischemia.  The patient had an artificial urinary sphincter placed on Wednesday, 03/09/2022.  His Eliquis was held a week prior to the procedure.  1 to 2 days after being off Eliquis he started having pain in his left lower extremity.  His left lower extremity pain became progressively worse, 12/10 in the last 24 hours.  Also endorses his skin turning cold and blue, involving his left toes.  Associated with inability to bear weight on his left lower extremity.    He went to see his primary care provider today.  The physical exam was concerning for left critical lower limb ischemia.  The patient was advised to go to the ED for further evaluation.  CT angio aortobifemoral with and without contrast of left lower extremity revealed asymmetric flow to the lower extremities with delayed flow in the right lower extremity.  Evidence of eccentric thrombus in the left outflow vessels with distal occlusions in left runoff vessels.  Findings concerning for thromboembolic disease based on the disease configuration.  Delayed filling of the right popliteal artery is concerning for underlying occlusions and thromboembolic disease in the right runoff vessels.  New post operative changes in the pelvis and left scrotum associated with  penile prosthesis.  The patient was started on heparin drip in the ED.  EDP consulted vascular surgery.  Plan is to take the patient to the OR for revascularization.  The patient was admitted by the hospitalist service, [facility].  ED Course: Tmax 97.8.  BP 142/89, pulse 64, respiratory 16, saturation 97% on room air.  Lab studies significant for BUN 25, creatinine 1.40 from baseline creatinine 1.10.  GFR 52.  WBC 14.4, hemoglobin 16.1, platelet 432.  Review of Systems: Review of systems as noted in the HPI. All other systems reviewed and are negative.   Past Medical History:  Diagnosis Date   Arthritis    Basal cell carcinoma    L clavicle, L prox forearm- removed years ago    Bladder cancer (HCC)    Bladder neck contracture    CAD (coronary artery disease)    a. 12/2020 NSTEMI/Cath: LM nl, LAD 95ost/p, LCX 50p, RCA nl. EF 45-50%.   Cataract    CKD (chronic kidney disease), stage III [facility])    ED (erectile dysfunction)    Frequency    GERD (gastroesophageal reflux disease)    History of pulmonary embolus (PE) 11/2018   a. Following LE DVT-->Chronic warfarin.   Hyperlipidemia LDL goal <70    Hypertension    Hypothyroidism    Incontinence of urine    sui, s/p cryoablation   Ischemic cardiomyopathy    a. 11/2018 Echo: EF 60-65%; b. 12/2020 LV Gram: EF 45-50% in setting of NSTEMI.   Kidney stones    Neuropathy    feet   Nocturia    Prostate cancer (HCC)    S/P   CRYOABLATION  Right elbow tendinitis    Right Lower Extremity DVT (deep venous thrombosis) (HCC) 11/25/2018   Vertigo    1-2x/yr   Wears glasses    Past Surgical History:  Procedure Laterality Date   ARTHROPLASTY AND TENDON REPAIR LEFT THUMB  JAN 2014   CATARACT EXTRACTION W/PHACO Left 11/16/2020   Procedure: CATARACT EXTRACTION PHACO AND INTRAOCULAR LENS PLACEMENT (IOC) LEFT  7.09 00:56.4 12.6%;  Surgeon: Lockie Mola, MD;  Location: [facility];  Service: Ophthalmology;  Laterality: Left;   CATARACT  EXTRACTION W/PHACO Right 12/02/2020   Procedure: CATARACT EXTRACTION PHACO AND INTRAOCULAR LENS PLACEMENT (IOC) RIGHT MALYUGIN 5.52 00:49.7 11.1%;  Surgeon: Lockie Mola, MD;  Location: [facility];  Service: Ophthalmology;  Laterality: Right;   COLONOSCOPY     COLONOSCOPY WITH PROPOFOL N/A 08/09/2017   Procedure: COLONOSCOPY WITH PROPOFOL;  Surgeon: Scot Jun, MD;  Location: [facility] ENDOSCOPY;  Service: Endoscopy;  Laterality: N/A;   CORONARY ANGIOGRAPHY N/A 12/28/2020   Procedure: CORONARY ANGIOGRAPHY;  Surgeon: Tonny Bollman, MD;  Location: [facility] INVAS[facility] CV LAB;  Service: Cardiovascular;  Laterality: N/A;   CORONARY STENT INTERVENTION N/A 12/28/2020   Procedure: CORONARY STENT INTERVENTION;  Surgeon: Tonny Bollman, MD;  Location: [facility] INVASIVE CV LAB;  Service: Cardiovascular;  Laterality: N/A;   ESOPHAGOGASTRODUODENOSCOPY (EGD) WITH PROPOFOL N/A 08/09/2017   Procedure: ESOPHAGOGASTRODUODENOSCOPY (EGD) WITH PROPOFOL;  Surgeon: Scot Jun, MD;  Location: [facility] ENDOSCOPY;  Service: Endoscopy;  Laterality: N/A;   GREEN LIGHT LASER TURP (TRANSURETHRAL RESECTION OF PROSTATE N/A 12/14/2015   Procedure: GREEN LIGHT LASER TURP (TRANSURETHRAL RESECTION OF PROSTATE) LASER OF BLADDER NECK CONTRACTURE;  Surgeon: Jethro Bolus, MD;  Location: [facility];  Service: Urology;  Laterality: N/A;   LEFT HEART CATH AND CORONARY ANGIOGRAPHY N/A 12/24/2020   Procedure: LEFT HEART CATH AND CORONARY ANGIOGRAPHY and possible PCI and stent;  Surgeon: Alwyn Pea, MD;  Location: [facility] INVASIVE CV LAB;  Service: Cardiovascular;  Laterality: N/A;   LEFT HEART CATH AND CORONARY ANGIOGRAPHY N/A 01/06/2021   Procedure: LEFT HEART CATH AND CORONARY ANGIOGRAPHY;  Surgeon: Lennette Bihari, MD;  Location: [facility] INVASIVE CV LAB;  Service: Cardiovascular;  Laterality: N/A;   PENILE PROSTHESIS IMPLANT N/A 12/06/2013   Procedure: PENILE PROTHESIS INFLATABLE;  Surgeon: Kathi Ludwig,  MD;  Location: [facility];  Service: Urology;  Laterality: N/A;   PROSTATE CRYOABLATION  06-01-2012  [facility]   TRANSURETHRAL RESECTION OF BLADDER NECK N/A 03/20/2015   Procedure: RELEASE BLADDER NECK CONTRACTURE  WITH WOLF BUTTON ELECTRODE ;  Surgeon: Jethro Bolus, MD;  Location: [facility] ;  Service: Urology;  Laterality: N/A;   TRANSURETHRAL RESECTION OF BLADDER TUMOR N/A 12/05/2018   Procedure: TRANSURETHRAL RESECTION OF BLADDER TUMOR (TURBT)/CYSTOSCOPY/  INSTILLATION OF Michelle Piper;  Surgeon: Rene Paci, MD;  Location: [facility];  Service: Urology;  Laterality: N/A;   TRANSURETHRAL RESECTION OF BLADDER TUMOR N/A 01/15/2020   Procedure: TRANSURETHRAL RESECTION OF BLADDER TUMOR (TURBT)/ CYSTOSCOPY;  Surgeon: Rene Paci, MD;  Location: [facility];  Service: Urology;  Laterality: N/A;   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      Social History:  reports that he has never smoked. He has never used smokeless tobacco. He reports that he does not drink alcohol and does not use drugs.   Allergies  Allergen Reactions   Doxazosin Other (See Comments)    Other reaction(s): Unknown    Family History  Problem Relation  Age of Onset   CAD Father 32      Prior to Admission medications   Medication Sig Start Date End Date Taking? Authorizing Provider  amLODipine (NORVASC) 5 MG tablet Take 5 mg by mouth daily.   Yes [provider]  ascorbic acid (VITAMIN C) 500 MG tablet Take 500 mg by mouth daily.   Yes [provider]  cetirizine (ZYRTEC) 10 MG tablet Take 10 mg by mouth daily.   Yes [provider]  clopidogrel (PLAVIX) 75 MG tablet Take 1 tablet (75 mg total) by mouth daily with breakfast. 12/29/20  Yes Kroeger, Dot Lanes M., PA-C  ELIQUIS 5 MG TABS tablet Take 5 mg by mouth 2 (two) times daily. 12/14/21  Yes [provider]   Glucosamine-Chondroit-Vit C-Mn (GLUCOSAMINE CHONDR 1500 COMPLX PO) Take 1 tablet by mouth every evening.   Yes [provider]  levothyroxine (SYNTHROID) 50 MCG tablet Take 50 mcg by mouth. 04/20/20 03/11/22 Yes [provider]  metoprolol tartrate (LOPRESSOR) 25 MG tablet Take 0.5 tablets (12.5 mg total) by mouth 2 (two) times daily. 12/25/20  Yes Enedina Finner, MD  Multiple Vitamin (MULTIVITAMIN) tablet Take 1 tablet by mouth daily.   Yes [provider]  pantoprazole (PROTONIX) 40 MG tablet Take 1 tablet (40 mg total) by mouth daily. 12/29/20  Yes Kroeger, Ovidio Kin., PA-C  potassium phosphate, monobasic, (K-PHOS ORIGINAL) 500 MG tablet Take 500 mg by mouth daily.   Yes [provider]  pravastatin (PRAVACHOL) 40 MG tablet Take 40 mg by mouth daily. 12/26/21  Yes [provider]  sulfamethoxazole-trimethoprim (BACTRIM DS) 800-160 MG tablet Take 1 tablet by mouth 2 (two) times daily. 03/10/22  Yes [provider]  TURMERIC PO Take 1 tablet by mouth daily.   Yes [provider]  acetaminophen (TYLENOL) 500 MG tablet Take 500 mg by mouth every 6 (six) hours as needed.    [provider]  aspirin 81 MG EC tablet Take 1 tablet (81 mg total) by mouth daily. Swallow whole. Patient not taking: Reported on 03/11/2022 12/29/20   Beatriz Stallion., PA-C  atorvastatin (LIPITOR) 80 MG tablet Take 1 tablet (80 mg total) by mouth daily. Patient not taking: Reported on 03/11/2022 12/29/20   Beatriz Stallion., PA-C  cholecalciferol (VITAMIN D3) 25 MCG (1000 UT) tablet Take 1,000 Units by mouth daily. Patient not taking: Reported on 01/18/2021    [provider]  Chromium 1 MG CAPS Take 1 mg by mouth daily. 1 per day Patient not taking: Reported on 03/11/2022    [provider]  doxycycline (VIBRAMYCIN) 100 MG capsule Take 100 mg by mouth 2 (two) times daily. Patient not taking: Reported on 03/11/2022 02/03/22   [provider]   enoxaparin (LOVENOX) 80 MG/0.8ML injection Inject 0.775 mLs (77.5 mg total) into the skin 2 (two) times daily for 5 days. 11/21/21 11/26/21  Tommi Rumps, PA-C  isosorbide mononitrate (IMDUR) 30 MG 24 hr tablet Take 1 tablet (30 mg total) by mouth daily. Patient not taking: Reported on 03/11/2022 01/08/21   Marcelino Duster, PA  loratadine (CLARITIN) 10 MG tablet Take 10 mg by mouth in the morning and at bedtime. Patient not taking: Reported on 03/11/2022    [provider]  methocarbamol (ROBAXIN) 500 MG tablet Take 1 tablet (500 mg total) by mouth 4 (four) times daily. 03/03/22   Cuthriell, Delorise Royals, PA-C  niacin 500 MG tablet Take 500 mg by mouth at bedtime. Patient not taking: Reported  on 01/18/2021    [provider]  nitroGLYCERIN (NITROSTAT) 0.4 MG SL tablet Place 1 tablet (0.4 mg total) under the tongue every 5 (five) minutes x 3 doses as needed for chest pain. 12/29/20   Kroeger, Ovidio Kin., PA-C  predniSONE (DELTASONE) 10 MG tablet Take 1 tablet (10 mg total) by mouth as directed. 03/03/22   Cuthriell, Delorise Royals, PA-C  testosterone cypionate (DEPOTESTOSTERONE CYPIONATE) 200 MG/ML injection Inject 200 mg into the muscle every 14 (fourteen) days. 10/28/21   [provider]  traMADol (ULTRAM) 50 MG tablet Take 50 mg by mouth every 6 (six) hours as needed for mild pain. 03/08/22   [provider]  triamcinolone cream (KENALOG) 0.1 % Apply topically 2 (two) times daily. 02/03/22   [provider]  warfarin (COUMADIN) 4 MG tablet Take 0.5-1 tablets (2-4 mg total) by mouth See admin instructions. Take 4mg  daily by mouth with the exception of 2mg  daily by mouth on Mondays and Thursdays. Take 4 mg 01/07/21, then resume regular schedule. Patient not taking: Reported on 03/11/2022 01/07/21   Marcelino Duster, PA    Physical Exam: BP (!) 142/89   Pulse 64   Temp 97.8 F (36.6 C) (Oral)   Resp 16   SpO2 97%   General: 76 y.o. year-old male well  developed well nourished in no acute distress.  Alert and oriented x3. Cardiovascular: Regular rate and rhythm with no rubs or gallops.  No thyromegaly or JVD noted.  No lower extremity edema.  Difficult to palpate left dorsalis pedis pulse. Respiratory: Clear to auscultation with no wheezes or rales. Good inspiratory effort. Abdomen: Soft nontender nondistended with normal bowel sounds x4 quadrants. Muskuloskeletal: No cyanosis, clubbing or edema noted bilaterally Neuro: CN II-XII intact, strength, sensation, reflexes Skin: No ulcerative lesions noted or rashes.  Bluish color left toes. Psychiatry: Judgement and insight appear normal. Mood is appropriate for condition and setting          Labs on Admission:  Basic Metabolic Panel: Recent Labs  Lab 03/11/22 1258  NA 136  K 4.3  CL 104  CO2 25  GLUCOSE 133*  BUN 25*  CREATININE 1.40*  CALCIUM 9.2   Liver Function Tests: Recent Labs  Lab 03/11/22 1258  AST 33  ALT 25  ALKPHOS 48  BILITOT 0.9  PROT 7.1  ALBUMIN 3.8   No results for input(s): "LIPASE", "AMYLASE" in the last 168 hours. No results for input(s): "AMMONIA" in the last 168 hours. CBC: Recent Labs  Lab 03/11/22 1258  WBC 14.4*  NEUTROABS 10.6*  HGB 16.2  HCT 53.1*  MCV 88.2  PLT 432*   Cardiac Enzymes: No results for input(s): "CKTOTAL", "CKMB", "CKMBINDEX", "TROPONINI" in the last 168 hours.  BNP (last 3 results) No results for input(s): "BNP" in the last 8760 hours.  ProBNP (last 3 results) No results for input(s): "PROBNP" in the last 8760 hours.  CBG: No results for input(s): "GLUCAP" in the last 168 hours.  Radiological Exams on Admission: CT Angio Aortobifemoral W and/or Wo Contrast  Result Date: 03/11/2022 CLINICAL DATA:  Left leg that is getting worse. Concern for ischemia. Reportedly, the patient had recent urologic surgery. EXAM: CT ANGIOGRAPHY OF ABDOMINAL AORTA WITH ILIOFEMORAL RUNOFF TECHNIQUE: Multidetector CT imaging of the  abdomen, pelvis and lower extremities was performed using the standard protocol during bolus administration of intravenous contrast. Multiplanar CT image reconstructions and MIPs were obtained to evaluate the vascular anatomy. RADIATION DOSE REDUCTION: This exam was performed according  to the departmental dose-optimization program which includes automated exposure control, adjustment of the mA and/or kV according to patient size and/or use of iterative reconstruction technique. CONTRAST:  OMNIPAQUE IOHEXOL 350 MG/ML SOLN COMPARISON:  CT abdomen and pelvis 02/05/2022 FINDINGS: VASCULAR Aorta: Normal caliber without aneurysm or dissection. Scattered atherosclerotic calcifications. No significant stenosis. Celiac: Patent without evidence of aneurysm, dissection, vasculitis or significant stenosis. Splenic artery is heavily calcified. SMA: Patent without evidence of aneurysm or significant stenosis. There is a small linear density in the mid SMA on sequence 4 image 66 and difficult to exclude focal dissection or intimal flap in this area. No other evidence for dissection or intimal flap. Renals: Mixed plaque at the origin and proximal aspect of the right renal artery causing approximately 50% stenosis. Left renal artery is patent without significant stenosis. No evidence for aneurysm or dissection involving renal arteries. IMA: Stenosis at the origin due to aortic plaque.  IMA is patent. RIGHT Lower Extremity Inflow: Common, internal and external iliac arteries are patent without evidence of aneurysm, dissection, vasculitis or significant stenosis. Outflow: Right common femoral artery is patent. Proximal right profunda femoral arteries are patent. Proximal right SFA is patent. Decreased opacification in the mid and distal right SFA on the initial set of images. The right popliteal artery is not opacified with contrast on the initial set of images compared to the left lower extremity but there is flow in the right  popliteal artery on the delayed images suggesting slow flow in the right popliteal artery. Runoff: Limited evaluation but there may be a small amount of flow the proximal runoff vessels. LEFT Lower Extremity Inflow: Common, internal and external iliac arteries are patent without evidence of aneurysm, dissection, vasculitis or significant stenosis. Outflow: Left common femoral artery is patent. Left profunda femoral arteries are patent. Left SFA is patent with mild atherosclerotic disease. Nonocclusive filling defect in the distal left SFA on sequence 4, image 234 and there is eccentric thrombus in the left popliteal artery above the knee causing severe stenosis on sequence 4 image 284. Additional intraluminal thrombus in the popliteal artery below the knee on image 310. Configuration of this thrombus is concerning for embolic disease. Runoff: The proximal and mid anterior tibial artery is patent but there is an abrupt occlusion in the distal anterior tibial artery. Distal thrombus in the posterior tibial artery. Distal occlusion in the peroneal artery with distal reconstitution. Findings are most compatible with thromboembolic disease in the runoff vessels. Veins: No obvious venous abnormality within the limitations of this arterial phase study. Review of the MIP images confirms the above findings. NON-VASCULAR Lower chest: Calcified granuloma in the left lower lobe. No pleural effusions. Hepatobiliary: Normal appearance of the liver and gallbladder. No biliary dilatation. Pancreas: Unremarkable. No pancreatic ductal dilatation or surrounding inflammatory changes. Spleen: Normal in size without focal abnormality. Adrenals/Urinary Tract: Normal appearance of the adrenal glands. Bilateral renal cysts without hydronephrosis. Again noted is a small exophytic hyperdense structure in the inferior left kidney that measures up to 1.1 cm on sequence 4 image 88. 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 Tiny focus of gas in the urinary bladder which may be iatrogenic. No ureter dilatation. Focal low-density near the base of the bladder likely related to previous bladder or urethral surgery. Stomach/Bowel: Scattered colonic diverticula. No acute abnormality involving the appendix. No evidence for bowel dilatation or focal bowel inflammation. Lymphatic: No lymph node  enlargement in the abdomen or pelvis. Reproductive: Patient has penile implants within old fluid reservoir on right side and a new fluid reservoir on the left. Subcutaneous gas around this new left device is compatible with recent surgery. There is probably a small prostate with postoperative changes. Other: Negative for free fluid. Negative for free air. Subcutaneous gas extending into the left scrotum compatible with prior surgery. Cannot exclude a small amount of fluid in left scrotum. Musculoskeletal: No acute bone abnormality. IMPRESSION: VASCULAR 1. Asymmetric flow to the lower extremities with delayed flow in the right lower extremity. Evidence for eccentric thrombus in the left outflow vessels with distal occlusions in left runoff vessels. Findings are concerning for thromboembolic disease based on the disease configuration. Delayed filling of the right popliteal artery is concerning for underlying occlusions and thromboembolic disease in the right runoff vessels. 2.  Aortic Atherosclerosis (ICD10-I70.0). 3. 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 4. Question small intimal flap or focal dissection in the SMA. 5. Stenosis at the origin of the IMA related to aortic plaque. NON-VASCULAR 1. New postoperative changes in the pelvis and left scrotum associated with penile prosthesis. 2. Bilateral renal cysts. 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 These results were called by telephone at the time of interpretation on  03/11/2022 at 2:57 pm to provider [facility] , who verbally acknowledged these results. Electronically Signed   By: Richarda Overlie M.D.   On: 03/11/2022 15:05    EKG: I independently viewed the EKG done and my findings are as followed: Sinus rhythm rate of 62.  Nonspecific ST-T changes.  QTc 395  Assessment/Plan Present on Admission:  Arterial occlusion  Principal Problem:   Arterial occlusion  Left lower extremity pain with concern for arterial occlusion/left critical lower limb ischemia Official radiology report: 1. Asymmetric flow to the lower extremities with delayed flow in the right lower extremity. Evidence for eccentric thrombus in the left outflow vessels with distal occlusions in left runoff vessels. Findings are concerning for thromboembolic disease based on the disease configuration. Delayed filling of the right popliteal artery is concerning for underlying occlusions and thromboembolic disease in the right runoff vessels. 2.  Aortic Atherosclerosis (ICD10-I70.0). 3. 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 4. Question small intimal flap or focal dissection in the SMA. 5. Stenosis at the origin of the IMA related to aortic plaque. Off home Eliquis 1 week prior to procedure that was completed on 03/09/2022. Onset of left lower extremity pain 1 to 2 days after being off Eliquis. The patient was started on heparin drip in the ED Vascular surgery was consulted, plan is to take to the OR for possible revascularization.  History of bladder cancer, postresection Male urinary stress incontinence status post artificial urinary sphincter placement on 03/09/2022 by urology. The patient has a controller in his left inguinal region.  AKI, suspect prerenal Baseline creatinine appears to be 1.1 with GFR greater than 60. Presented with creatinine 1.40 GFR 52  Start gentle IV fluid hydration Closely monitor urine output with strict I's and O's Avoid nephrotoxic agents, dehydration  and hypotension. Repeat renal panel in the morning.  Coronary artery disease status post PCI with stenting Hold off on Plavix until after procedure Resume Plavix when okay with vascular surgery Per medical records the patient stopped taking home Lipitor, on pravastatin. Resume home pravastatin Denies any anginal symptoms Closely monitor on telemetry.  History of DVTs/PE Initial plan was to restart home  Eliquis on 03/11/2022. The patient is currently on heparin drip for anticipated vascular surgery procedure Switch to home Eliquis when okay with vascular surgery.  Leukocytosis, suspect reactive Afebrile Presented with WBC 14.4k Nonseptic appearing  Thrombocytosis, likely reactive Presented with platelet count of 432k Monitor for now  Essential hypertension Resume home oral antihypertensives.   Closely monitor vital signs  Hypothyroidism Resume home levothyroxine  GERD Resume home PPI    Critical care time: 65 minutes.    DVT prophylaxis: Heparin drip  Code Status: Full code  Family Communication: Wife at bedside  Disposition Plan: Admitted to stepdown unit  Consults called: Vascular surgery consulted by EDP  Admission status: Inpatient status.   Status is: Inpatient The patient requires at least 2 midnights for further evaluation and treatment of present condition.   Darlin Drop MD [facility] Pager [phone]  If 7PM-7AM, please contact night-coverage [email or web address] Password [facility]  03/11/2022, 3:51 PM

## 2022-03-11 NOTE — ED Notes (Signed)
Patient transported to CT 

## 2022-03-12 ENCOUNTER — Inpatient Hospital Stay: Payer: Medicare HMO | Admitting: Certified Registered"

## 2022-03-12 ENCOUNTER — Encounter
Admission: EM | Disposition: A | Discharge status: Skilled Nursing Facility | Payer: Self-pay | Source: Ambulatory Visit | Attending: Internal Medicine

## 2022-03-12 ENCOUNTER — Inpatient Hospital Stay: Payer: Medicare HMO

## 2022-03-12 ENCOUNTER — Encounter: Payer: Self-pay | Admitting: Internal Medicine

## 2022-03-12 ENCOUNTER — Other Ambulatory Visit: Payer: Self-pay

## 2022-03-12 DIAGNOSIS — I7090 Unspecified atherosclerosis: Secondary | ICD-10-CM | POA: Diagnosis not present

## 2022-03-12 HISTORY — PX: THROMBECTOMY ILIAC ARTERY: SHX6405

## 2022-03-12 LAB — BASIC METABOLIC PANEL
Anion gap: 3 — ABNORMAL LOW (ref 5–15)
BUN: 19 mg/dL (ref 8–23)
CO2: 25 mmol/L (ref 22–32)
Calcium: 8.7 mg/dL — ABNORMAL LOW (ref 8.9–10.3)
Chloride: 108 mmol/L (ref 98–111)
Creatinine, Ser: 1.16 mg/dL (ref 0.61–1.24)
GFR, Estimated: >60 mL/min (ref >60)
Glucose, Bld: 104 mg/dL — ABNORMAL HIGH (ref 70–99)
Potassium: 4.3 mmol/L (ref 3.5–5.1)
Sodium: 136 mmol/L (ref 135–145)

## 2022-03-12 LAB — CBC
HCT: 49.7 % (ref 39.0–52.0)
Hemoglobin: 15.4 g/dL (ref 13.0–17.0)
MCH: 27 pg (ref 26.0–34.0)
MCHC: 31 g/dL (ref 30.0–36.0)
MCV: 87.2 fL (ref 80.0–100.0)
Platelets: 409 10*3/uL — ABNORMAL HIGH (ref 150–400)
RBC: 5.7 MIL/uL (ref 4.22–5.81)
RDW: 15.9 % — ABNORMAL HIGH (ref 11.5–15.5)
WBC: 13.5 10*3/uL — ABNORMAL HIGH (ref 4.0–10.5)
nRBC: 0 % (ref 0.0–0.2)

## 2022-03-12 LAB — GLUCOSE, CAPILLARY
Glucose-Capillary: 119 mg/dL — ABNORMAL HIGH (ref 70–99)
Glucose-Capillary: 122 mg/dL — ABNORMAL HIGH (ref 70–99)
Glucose-Capillary: 146 mg/dL — ABNORMAL HIGH (ref 70–99)
Glucose-Capillary: 182 mg/dL — ABNORMAL HIGH (ref 70–99)

## 2022-03-12 LAB — ABO/RH: ABO/RH(D): A NEG

## 2022-03-12 SURGERY — THROMBECTOMY, ARTERY, ILIAC
Anesthesia: General | Site: Leg Lower | Laterality: Left

## 2022-03-12 MED ORDER — HYDROMORPHONE 1 MG/ML IV SOLN
INTRAVENOUS | Status: DC
  Administered 2022-03-13: 1.2 mg via INTRAVENOUS
  Administered 2022-03-13 – 2022-03-14 (×4): 0.6 mg via INTRAVENOUS
  Administered 2022-03-14: 1.5 mg via INTRAVENOUS
  Administered 2022-03-14: 0.9 mg via INTRAVENOUS
  Administered 2022-03-14: 0.3 mg via INTRAVENOUS
  Filled 2022-03-12 (×2): qty 30

## 2022-03-12 MED ORDER — HEPARIN SODIUM (PORCINE) 5000 UNIT/ML IJ SOLN
INTRAMUSCULAR | Status: AC
  Filled 2022-03-12: qty 1

## 2022-03-12 MED ORDER — HEPARIN 5000 UNITS IN NS 1000 ML (FLUSH)
INTRAMUSCULAR | Status: DC | PRN
  Administered 2022-03-12: 50 mL via INTRAMUSCULAR

## 2022-03-12 MED ORDER — IOHEXOL 350 MG/ML SOLN
100.0000 mL | Freq: Once | INTRAVENOUS | Status: AC | PRN
  Administered 2022-03-12: 100 mL via INTRAVENOUS

## 2022-03-12 MED ORDER — POLYETHYLENE GLYCOL 3350 17 G PO PACK
17.0000 g | PACK | Freq: Every day | ORAL | Status: DC | PRN
  Administered 2022-03-12: 17 g via ORAL
  Filled 2022-03-12: qty 1

## 2022-03-12 MED ORDER — HEPARIN (PORCINE) 25000 UT/250ML-% IV SOLN
1300.0000 [IU]/h | INTRAVENOUS | Status: DC
  Administered 2022-03-13: 1300 [IU]/h via INTRAVENOUS
  Filled 2022-03-12: qty 250

## 2022-03-12 MED ORDER — CEFAZOLIN SODIUM-DEXTROSE 2-4 GM/100ML-% IV SOLN
2.0000 g | INTRAVENOUS | Status: AC
  Administered 2022-03-12: 2 g via INTRAVENOUS

## 2022-03-12 MED ORDER — LACTATED RINGERS IV SOLN: INTRAVENOUS | Status: AC

## 2022-03-12 MED ORDER — HYDROMORPHONE 1 MG/ML IV SOLN: INTRAVENOUS | Status: DC

## 2022-03-12 MED ORDER — HYDROMORPHONE HCL 1 MG/ML IJ SOLN
0.5000 mg | INTRAMUSCULAR | Status: DC | PRN
  Administered 2022-03-12 – 2022-03-19 (×7): 0.5 mg via INTRAVENOUS
  Filled 2022-03-12 (×7): qty 1

## 2022-03-12 MED ORDER — FENTANYL CITRATE (PF) 100 MCG/2ML IJ SOLN
INTRAMUSCULAR | Status: AC
  Filled 2022-03-12: qty 2

## 2022-03-12 MED ORDER — NALOXONE HCL 0.4 MG/ML IJ SOLN: 0.4000 mg | INTRAMUSCULAR | Status: DC | PRN

## 2022-03-12 MED ORDER — PHENYLEPHRINE 80 MCG/ML (10ML) SYRINGE FOR IV PUSH (FOR BLOOD PRESSURE SUPPORT)
PREFILLED_SYRINGE | INTRAVENOUS | Status: DC | PRN
  Administered 2022-03-12 (×5): 80 ug via INTRAVENOUS

## 2022-03-12 MED ORDER — FENTANYL CITRATE (PF) 100 MCG/2ML IJ SOLN
25.0000 ug | INTRAMUSCULAR | Status: DC | PRN
  Administered 2022-03-13 (×2): 25 ug via INTRAVENOUS

## 2022-03-12 MED ORDER — SENNOSIDES-DOCUSATE SODIUM 8.6-50 MG PO TABS
2.0000 | ORAL_TABLET | Freq: Two times a day (BID) | ORAL | Status: DC
  Administered 2022-03-13 – 2022-03-18 (×12): 2 via ORAL
  Administered 2022-03-19: 1 via ORAL
  Administered 2022-03-20: 2 via ORAL
  Filled 2022-03-12 (×16): qty 2

## 2022-03-12 MED ORDER — APIXABAN 5 MG PO TABS: 5.0000 mg | ORAL_TABLET | Freq: Two times a day (BID) | ORAL | Status: DC

## 2022-03-12 MED ORDER — PROPOFOL 10 MG/ML IV BOLUS
INTRAVENOUS | Status: DC | PRN
  Administered 2022-03-12: 120 mg via INTRAVENOUS

## 2022-03-12 MED ORDER — LEVOTHYROXINE SODIUM 50 MCG PO TABS
50.0000 ug | ORAL_TABLET | Freq: Every day | ORAL | Status: DC
Start: 2022-03-12 — End: 2022-03-28
  Administered 2022-03-13 – 2022-03-28 (×16): 50 ug via ORAL
  Filled 2022-03-12 (×17): qty 1

## 2022-03-12 MED ORDER — DIPHENHYDRAMINE HCL 12.5 MG/5ML PO ELIX: 12.5000 mg | ORAL_SOLUTION | Freq: Four times a day (QID) | ORAL | Status: DC | PRN

## 2022-03-12 MED ORDER — 0.9 % SODIUM CHLORIDE (POUR BTL) OPTIME
TOPICAL | Status: DC | PRN
  Administered 2022-03-12: 50 mL

## 2022-03-12 MED ORDER — GABAPENTIN 100 MG PO CAPS
100.0000 mg | ORAL_CAPSULE | Freq: Three times a day (TID) | ORAL | Status: DC
  Administered 2022-03-12 – 2022-03-14 (×5): 100 mg via ORAL
  Filled 2022-03-12 (×7): qty 1

## 2022-03-12 MED ORDER — HYDROMORPHONE HCL 1 MG/ML IJ SOLN
0.5000 mg | INTRAMUSCULAR | Status: AC
  Administered 2022-03-12: 0.5 mg via INTRAVENOUS
  Filled 2022-03-12: qty 1

## 2022-03-12 MED ORDER — ONDANSETRON HCL 4 MG/2ML IJ SOLN
INTRAMUSCULAR | Status: DC | PRN
  Administered 2022-03-12: 4 mg via INTRAVENOUS

## 2022-03-12 MED ORDER — SODIUM CHLORIDE 0.9% IV SOLUTION: Freq: Once | INTRAVENOUS | Status: DC

## 2022-03-12 MED ORDER — OXYCODONE-ACETAMINOPHEN 7.5-325 MG PO TABS
1.0000 | ORAL_TABLET | ORAL | Status: DC | PRN
  Administered 2022-03-12 – 2022-03-18 (×4): 2 via ORAL
  Filled 2022-03-12 (×4): qty 2

## 2022-03-12 MED ORDER — PANTOPRAZOLE SODIUM 40 MG PO TBEC
40.0000 mg | DELAYED_RELEASE_TABLET | Freq: Every day | ORAL | Status: DC
  Administered 2022-03-12: 40 mg via ORAL
  Filled 2022-03-12: qty 1

## 2022-03-12 MED ORDER — ONDANSETRON HCL 4 MG/2ML IJ SOLN: 4.0000 mg | Freq: Once | INTRAMUSCULAR | Status: DC | PRN

## 2022-03-12 MED ORDER — PROPOFOL 10 MG/ML IV BOLUS
INTRAVENOUS | Status: AC
  Filled 2022-03-12: qty 20

## 2022-03-12 MED ORDER — MELATONIN 5 MG PO TABS
5.0000 mg | ORAL_TABLET | Freq: Every evening | ORAL | Status: DC | PRN
Start: 2022-03-12 — End: 2022-03-28
  Administered 2022-03-24: 5 mg via ORAL
  Filled 2022-03-12: qty 1

## 2022-03-12 MED ORDER — DIPHENHYDRAMINE HCL 50 MG/ML IJ SOLN: 12.5000 mg | Freq: Four times a day (QID) | INTRAMUSCULAR | Status: DC | PRN

## 2022-03-12 MED ORDER — ROCURONIUM BROMIDE 100 MG/10ML IV SOLN
INTRAVENOUS | Status: DC | PRN
  Administered 2022-03-12: 50 mg via INTRAVENOUS
  Administered 2022-03-12 (×2): 20 mg via INTRAVENOUS

## 2022-03-12 MED ORDER — SUGAMMADEX SODIUM 200 MG/2ML IV SOLN
INTRAVENOUS | Status: DC | PRN
  Administered 2022-03-12: 200 mg via INTRAVENOUS

## 2022-03-12 MED ORDER — ONDANSETRON HCL 4 MG/2ML IJ SOLN: 4.0000 mg | Freq: Four times a day (QID) | INTRAMUSCULAR | Status: DC | PRN

## 2022-03-12 MED ORDER — APIXABAN 5 MG PO TABS
10.0000 mg | ORAL_TABLET | Freq: Two times a day (BID) | ORAL | Status: DC
  Administered 2022-03-12: 10 mg via ORAL
  Filled 2022-03-12: qty 2

## 2022-03-12 MED ORDER — LIDOCAINE HCL (CARDIAC) PF 100 MG/5ML IV SOSY
PREFILLED_SYRINGE | INTRAVENOUS | Status: DC | PRN
  Administered 2022-03-12: 100 mg via INTRAVENOUS

## 2022-03-12 MED ORDER — PHENYLEPHRINE HCL-NACL 20-0.9 MG/250ML-% IV SOLN
INTRAVENOUS | Status: DC | PRN
  Administered 2022-03-12: 40 ug/min via INTRAVENOUS

## 2022-03-12 MED ORDER — HEMOSTATIC AGENTS (NO CHARGE) OPTIME
TOPICAL | Status: DC | PRN
  Administered 2022-03-12 (×2): 1 via TOPICAL

## 2022-03-12 MED ORDER — CLOPIDOGREL BISULFATE 75 MG PO TABS
75.0000 mg | ORAL_TABLET | Freq: Every day | ORAL | Status: DC
  Administered 2022-03-12: 75 mg via ORAL
  Filled 2022-03-12: qty 1

## 2022-03-12 MED ORDER — SODIUM CHLORIDE 0.9% FLUSH: 9.0000 mL | INTRAVENOUS | Status: DC | PRN

## 2022-03-12 MED ORDER — HEPARIN SODIUM (PORCINE) 1000 UNIT/ML IJ SOLN
INTRAMUSCULAR | Status: DC | PRN
  Administered 2022-03-12: 5000 [IU] via INTRAVENOUS

## 2022-03-12 MED ORDER — FENTANYL CITRATE (PF) 100 MCG/2ML IJ SOLN
INTRAMUSCULAR | Status: DC | PRN
  Administered 2022-03-12: 50 ug via INTRAVENOUS
  Administered 2022-03-12: 25 ug via INTRAVENOUS
  Administered 2022-03-12: 50 ug via INTRAVENOUS
  Administered 2022-03-12: 25 ug via INTRAVENOUS
  Administered 2022-03-12: 50 ug via INTRAVENOUS

## 2022-03-12 MED ORDER — EPHEDRINE SULFATE (PRESSORS) 50 MG/ML IJ SOLN
INTRAMUSCULAR | Status: DC | PRN
  Administered 2022-03-12: 10 mg via INTRAVENOUS

## 2022-03-12 SURGICAL SUPPLY — 65 items
ADH SKN CLS APL DERMABOND .7 (GAUZE/BANDAGES/DRESSINGS) ×2
BAG DECANTER FOR FLEXI CONT (MISCELLANEOUS) ×2 IMPLANT
BLADE SURG SZ11 CARB STEEL (BLADE) ×2 IMPLANT
BOOT SUTURE AID YELLOW STND (SUTURE) ×2 IMPLANT
BRUSH SCRUB EZ  4% CHG (MISCELLANEOUS)
BRUSH SCRUB EZ 4% CHG (MISCELLANEOUS) ×1 IMPLANT
BULB RESERV EVAC DRAIN JP 100C (MISCELLANEOUS) ×1 IMPLANT
CATH EMBOLECTOMY 2X60 (CATHETERS) ×2 IMPLANT
CATH EMBOLECTOMY 3X80 (CATHETERS) ×1 IMPLANT
CLIP SPRNG 6 S-JAW DBL (CLIP) IMPLANT
CLIP SPRNG 6MM S-JAW DBL (CLIP)
DERMABOND ADVANCED (GAUZE/BANDAGES/DRESSINGS) ×2
DERMABOND ADVANCED .7 DNX12 (GAUZE/BANDAGES/DRESSINGS) ×1 IMPLANT
DRAIN CHANNEL JP 19F (MISCELLANEOUS) ×1 IMPLANT
DRSG TEGADERM 4X4.75 (GAUZE/BANDAGES/DRESSINGS) ×1 IMPLANT
ELECT CAUTERY BLADE 6.4 (BLADE) ×2 IMPLANT
ELECT REM PT RETURN 9FT ADLT (ELECTROSURGICAL) ×2
ELECTRODE REM PT RTRN 9FT ADLT (ELECTROSURGICAL) ×1 IMPLANT
GAUZE 4X4 16PLY ~~LOC~~+RFID DBL (SPONGE) ×1 IMPLANT
GLOVE BIO SURGEON STRL SZ7 (GLOVE) ×4 IMPLANT
GOWN STRL REUS W/ TWL LRG LVL3 (GOWN DISPOSABLE) ×1 IMPLANT
GOWN STRL REUS W/ TWL XL LVL3 (GOWN DISPOSABLE) ×1 IMPLANT
GOWN STRL REUS W/TWL LRG LVL3 (GOWN DISPOSABLE) ×4
GOWN STRL REUS W/TWL XL LVL3 (GOWN DISPOSABLE) ×2
HEMOSTAT SURGICEL 2X3 (HEMOSTASIS) ×3 IMPLANT
IV NS 500ML (IV SOLUTION) ×2
IV NS 500ML BAXH (IV SOLUTION) ×1 IMPLANT
KIT TURNOVER KIT A (KITS) ×2 IMPLANT
LABEL OR SOLS (LABEL) ×2 IMPLANT
LOOP RED MAXI  1X406MM (MISCELLANEOUS) ×1
LOOP VESSEL MAXI 1X406 RED (MISCELLANEOUS) ×1 IMPLANT
LOOP VESSEL MINI 0.8X406 BLUE (MISCELLANEOUS) ×2 IMPLANT
LOOPS BLUE MINI 0.8X406MM (MISCELLANEOUS) ×1
MANIFOLD NEPTUNE II (INSTRUMENTS) ×2 IMPLANT
NDL FILTER BLUNT 18X1 1/2 (NEEDLE) ×1 IMPLANT
NDL HYPO 25X1 1.5 SAFETY (NEEDLE) ×1 IMPLANT
NEEDLE FILTER BLUNT 18X 1/2SAF (NEEDLE) ×1
NEEDLE FILTER BLUNT 18X1 1/2 (NEEDLE) ×1 IMPLANT
NEEDLE HYPO 25X1 1.5 SAFETY (NEEDLE) ×2 IMPLANT
NS IRRIG 1000ML POUR BTL (IV SOLUTION) ×1 IMPLANT
NS IRRIG 500ML POUR BTL (IV SOLUTION) ×1 IMPLANT
PACK EXTREMITY ARMC (MISCELLANEOUS) ×2 IMPLANT
SOLUTION CELL SAVER (CLIP) IMPLANT
SPONGE DRAIN TRACH 4X4 STRL 2S (GAUZE/BANDAGES/DRESSINGS) ×1 IMPLANT
STOCKINETTE 48X4 2 PLY STRL (GAUZE/BANDAGES/DRESSINGS) ×1 IMPLANT
STOCKINETTE STRL 4IN 9604848 (GAUZE/BANDAGES/DRESSINGS) ×2 IMPLANT
SUT ETHILON 3-0 FS-10 30 BLK (SUTURE) ×2
SUT MNCRL AB 4-0 PS2 18 (SUTURE) ×4 IMPLANT
SUT PROLENE 6 0 BV (SUTURE) ×7 IMPLANT
SUT PROLENE 7 0 BV 1 (SUTURE) ×5 IMPLANT
SUT SILK 2 0 (SUTURE) ×2
SUT SILK 2-0 18XBRD TIE 12 (SUTURE) ×1 IMPLANT
SUT SILK 3 0 (SUTURE) ×4
SUT SILK 3-0 18XBRD TIE 12 (SUTURE) ×1 IMPLANT
SUT SILK 4 0 (SUTURE)
SUT SILK 4-0 18XBRD TIE 12 (SUTURE) ×1 IMPLANT
SUT VIC AB 2-0 SH 27 (SUTURE) ×4
SUT VIC AB 2-0 SH 27XBRD (SUTURE) IMPLANT
SUT VIC AB 3-0 SH 27 (SUTURE) ×6
SUT VIC AB 3-0 SH 27X BRD (SUTURE) ×1 IMPLANT
SUTURE EHLN 3-0 FS-10 30 BLK (SUTURE) IMPLANT
SYR 20ML LL LF (SYRINGE) ×2 IMPLANT
SYR 3ML LL SCALE MARK (SYRINGE) ×4 IMPLANT
TOWEL OR 17X26 4PK STRL BLUE (TOWEL DISPOSABLE) ×2 IMPLANT
WATER STERILE IRR 500ML POUR (IV SOLUTION) ×2 IMPLANT

## 2022-03-12 NOTE — Progress Notes (Signed)
      LLE CTA demonstrate rethrombosis of SFA proximal to stent along with rethrombosis and occlusion of PTA.  - Pt has been on heparin drip all night along with one dose of Eliquis today - Despite this pt is reoccluding, likely due to thrombophilia and poor runoff. - Subsequently chances of limb salvage are low but will offer patient emergent thrombectomy of LEFT leg. - Risks include but are not limited to: bleeding, infection, nerve damage, inability to revascular the left foot leading to amputation, myocardial infarction, stroke, and death. - Pt is aware he is at high for bleeding as he will need to be continued on anticoagulation due to his thrombophilia. - Additionally, pt is aware that there is high probability that lasting patency cannot be achieved this case as the foot may already be embolized, resulting in outflow obstruction. - Pt is aware of the risks and elects to proceed.   Leonides Sake, MD, FACS, FSVS Covering for [facility]: [phone]  03/12/2022, 7:09 PM

## 2022-03-13 DIAGNOSIS — I7090 Unspecified atherosclerosis: Secondary | ICD-10-CM | POA: Diagnosis not present

## 2022-03-13 LAB — COMPREHENSIVE METABOLIC PANEL
ALT: 19 U/L (ref 0–44)
AST: 23 U/L (ref 15–41)
Albumin: 3.2 g/dL — ABNORMAL LOW (ref 3.5–5.0)
Alkaline Phosphatase: 43 U/L (ref 38–126)
Anion gap: 6 (ref 5–15)
BUN: 21 mg/dL (ref 8–23)
CO2: 23 mmol/L (ref 22–32)
Calcium: 8.7 mg/dL — ABNORMAL LOW (ref 8.9–10.3)
Chloride: 105 mmol/L (ref 98–111)
Creatinine, Ser: 1.24 mg/dL (ref 0.61–1.24)
GFR, Estimated: >60 mL/min (ref >60)
Glucose, Bld: 171 mg/dL — ABNORMAL HIGH (ref 70–99)
Potassium: 4.4 mmol/L (ref 3.5–5.1)
Sodium: 134 mmol/L — ABNORMAL LOW (ref 135–145)
Total Bilirubin: 0.7 mg/dL (ref 0.3–1.2)
Total Protein: 5.9 g/dL — ABNORMAL LOW (ref 6.5–8.1)

## 2022-03-13 LAB — CBC WITH DIFFERENTIAL/PLATELET
Abs Immature Granulocytes: 0.18 10*3/uL — ABNORMAL HIGH (ref 0.00–0.07)
Basophils Absolute: 0.1 10*3/uL (ref 0.0–0.1)
Basophils Relative: 0 %
Eosinophils Absolute: 0.2 10*3/uL (ref 0.0–0.5)
Eosinophils Relative: 1 %
HCT: 46.4 % (ref 39.0–52.0)
Hemoglobin: 14.6 g/dL (ref 13.0–17.0)
Immature Granulocytes: 1 %
Lymphocytes Relative: 7 %
Lymphs Abs: 1.5 10*3/uL (ref 0.7–4.0)
MCH: 27.2 pg (ref 26.0–34.0)
MCHC: 31.5 g/dL (ref 30.0–36.0)
MCV: 86.6 fL (ref 80.0–100.0)
Monocytes Absolute: 1.5 10*3/uL — ABNORMAL HIGH (ref 0.1–1.0)
Monocytes Relative: 7 %
Neutro Abs: 17.4 10*3/uL — ABNORMAL HIGH (ref 1.7–7.7)
Neutrophils Relative %: 84 %
Platelets: 413 10*3/uL — ABNORMAL HIGH (ref 150–400)
RBC: 5.36 MIL/uL (ref 4.22–5.81)
RDW: 15.8 % — ABNORMAL HIGH (ref 11.5–15.5)
WBC: 20.9 10*3/uL — ABNORMAL HIGH (ref 4.0–10.5)
nRBC: 0 % (ref 0.0–0.2)

## 2022-03-13 LAB — APTT
aPTT: >160 seconds (ref 24–36)
aPTT: 79 seconds — ABNORMAL HIGH (ref 24–36)
aPTT: 83 seconds — ABNORMAL HIGH (ref 24–36)

## 2022-03-13 LAB — GLUCOSE, CAPILLARY
Glucose-Capillary: 146 mg/dL — ABNORMAL HIGH (ref 70–99)
Glucose-Capillary: 148 mg/dL — ABNORMAL HIGH (ref 70–99)
Glucose-Capillary: 157 mg/dL — ABNORMAL HIGH (ref 70–99)
Glucose-Capillary: 181 mg/dL — ABNORMAL HIGH (ref 70–99)

## 2022-03-13 LAB — HEPARIN LEVEL (UNFRACTIONATED): Heparin Unfractionated: >1.1 IU/mL — ABNORMAL HIGH (ref 0.30–0.70)

## 2022-03-13 LAB — MAGNESIUM: Magnesium: 1.9 mg/dL (ref 1.7–2.4)

## 2022-03-13 LAB — PHOSPHORUS: Phosphorus: 5 mg/dL — ABNORMAL HIGH (ref 2.5–4.6)

## 2022-03-13 MED ORDER — METOPROLOL TARTRATE 25 MG PO TABS
12.5000 mg | ORAL_TABLET | Freq: Two times a day (BID) | ORAL | Status: DC
  Administered 2022-03-13 – 2022-03-28 (×29): 12.5 mg via ORAL
  Filled 2022-03-13 (×30): qty 1

## 2022-03-13 MED ORDER — HEPARIN (PORCINE) 25000 UT/250ML-% IV SOLN
1000.0000 [IU]/h | INTRAVENOUS | Status: DC
  Administered 2022-03-13 – 2022-03-14 (×2): 1000 [IU]/h via INTRAVENOUS
  Filled 2022-03-13: qty 250

## 2022-03-13 MED ORDER — PHENOL 1.4 % MT LIQD
1.0000 | OROMUCOSAL | Status: DC | PRN
  Administered 2022-03-19: 1 via OROMUCOSAL
  Filled 2022-03-13: qty 177

## 2022-03-13 MED ORDER — GUAIFENESIN-DM 100-10 MG/5ML PO SYRP: 15.0000 mL | ORAL_SOLUTION | ORAL | Status: DC | PRN

## 2022-03-13 MED ORDER — ONDANSETRON HCL 4 MG/2ML IJ SOLN
4.0000 mg | Freq: Four times a day (QID) | INTRAMUSCULAR | Status: DC | PRN
  Administered 2022-03-13 – 2022-03-17 (×3): 4 mg via INTRAVENOUS
  Filled 2022-03-13 (×3): qty 2

## 2022-03-13 MED ORDER — SODIUM CHLORIDE 0.9 % IV SOLN: 500.0000 mL | Freq: Once | INTRAVENOUS | Status: DC | PRN

## 2022-03-13 MED ORDER — HYDRALAZINE HCL 20 MG/ML IJ SOLN: 5.0000 mg | INTRAMUSCULAR | Status: DC | PRN

## 2022-03-13 MED ORDER — SODIUM CHLORIDE 0.9 % IV SOLN
INTRAVENOUS | Status: DC
Start: 2022-03-13 — End: 2022-03-17

## 2022-03-13 MED ORDER — ACETAMINOPHEN 325 MG PO TABS
325.0000 mg | ORAL_TABLET | ORAL | Status: DC | PRN
  Administered 2022-03-19 – 2022-03-23 (×2): 650 mg via ORAL
  Filled 2022-03-13 (×2): qty 2

## 2022-03-13 MED ORDER — DOCUSATE SODIUM 100 MG PO CAPS
100.0000 mg | ORAL_CAPSULE | Freq: Every day | ORAL | Status: DC
  Administered 2022-03-14 – 2022-03-17 (×4): 100 mg via ORAL
  Filled 2022-03-13 (×4): qty 1

## 2022-03-13 MED ORDER — ALUM & MAG HYDROXIDE-SIMETH 200-200-20 MG/5ML PO SUSP
15.0000 mL | ORAL | Status: DC | PRN
  Administered 2022-03-23: 30 mL via ORAL
  Filled 2022-03-13: qty 30

## 2022-03-13 MED ORDER — ACETAMINOPHEN 650 MG RE SUPP: 325.0000 mg | RECTAL | Status: DC | PRN

## 2022-03-13 MED ORDER — CEFAZOLIN SODIUM-DEXTROSE 2-4 GM/100ML-% IV SOLN
2.0000 g | Freq: Three times a day (TID) | INTRAVENOUS | Status: AC
  Administered 2022-03-13 (×2): 2 g via INTRAVENOUS
  Filled 2022-03-13 (×2): qty 100

## 2022-03-13 MED ORDER — SENNOSIDES-DOCUSATE SODIUM 8.6-50 MG PO TABS: 1.0000 | ORAL_TABLET | Freq: Every evening | ORAL | Status: DC | PRN

## 2022-03-13 MED ORDER — MAGNESIUM SULFATE 2 GM/50ML IV SOLN: 2.0000 g | Freq: Every day | INTRAVENOUS | Status: DC | PRN

## 2022-03-13 MED ORDER — SORBITOL 70 % SOLN: 30.0000 mL | Freq: Every day | Status: DC | PRN

## 2022-03-13 MED ORDER — FLEET ENEMA 7-19 GM/118ML RE ENEM: 1.0000 | ENEMA | Freq: Once | RECTAL | Status: DC | PRN

## 2022-03-13 MED ORDER — LABETALOL HCL 5 MG/ML IV SOLN: 10.0000 mg | INTRAVENOUS | Status: DC | PRN

## 2022-03-13 MED ORDER — CLOPIDOGREL BISULFATE 75 MG PO TABS: 75.0000 mg | ORAL_TABLET | Freq: Every day | ORAL | Status: DC

## 2022-03-13 MED ORDER — PANTOPRAZOLE SODIUM 40 MG PO TBEC
40.0000 mg | DELAYED_RELEASE_TABLET | Freq: Every day | ORAL | Status: DC
Start: 2022-03-13 — End: 2022-03-28
  Administered 2022-03-13 – 2022-03-28 (×16): 40 mg via ORAL
  Filled 2022-03-13 (×17): qty 1

## 2022-03-13 MED ORDER — POTASSIUM CHLORIDE CRYS ER 20 MEQ PO TBCR: 20.0000 meq | EXTENDED_RELEASE_TABLET | Freq: Every day | ORAL | Status: DC | PRN

## 2022-03-13 MED ORDER — METOPROLOL TARTRATE 5 MG/5ML IV SOLN
2.5000 mg | Freq: Four times a day (QID) | INTRAVENOUS | Status: DC | PRN
  Administered 2022-03-13: 2.5 mg via INTRAVENOUS
  Filled 2022-03-13 (×2): qty 5

## 2022-03-13 NOTE — Progress Notes (Signed)
PROGRESS NOTE  Phillip [name] WCB:[id number] DOB: 1946/04/08 DOA: 03/11/2022 PCP: Danella Penton, MD  HPI/Recap of past 24 hours: Phillip [name] is a 76 y.o. 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 Hyacinth Meeker, due to concern for left critical lower limb ischemia.  The patient had an artificial urinary sphincter placed on Wednesday, 03/09/2022.  His Eliquis was held a week prior to the procedure.  1 to 2 days after being off Eliquis he started having pain in his left lower extremity.  His left lower extremity pain became progressively worse, 12/10 24 hours prior to presentation.  Associated with cyanotic appearing left toes and inability to bear weight on his left lower extremity.  He was seen by vascular surgery on 03/11/2022 and taken to the OR, status post TE L SFA & pop, PTA+S L SFA and pop, PTA L PTA by vascular surgery on 03/11/2022 by Dr. Gilda Crease.  On 03/12/22, he had severe LLE pain from his left calf to his left toes.  He returned to the OR for left lower extremity thromboembolectomy on 03/12/22 with drain placement.  Continue Heparin drip and continue to hold off Plavix for now until the patient is re-evaluated tomorrow by vascular surgery.  03/13/2022: Seen and examined at his bedside.  His left lower extremity is less painful.   Assessment/Plan: Principal Problem:   Arterial occlusion  Left lower extremity pain with concern for arterial occlusion/left critical lower limb ischemia, status post revascularization, on 03/11/2022. Hx of hemophilia, unspecified. POD #1 s/p TE L leg for recurrent L leg thrombosis, by Dr. Imogene Burn, Vascular surgery. POD #2 s/p TE L SFA &  pop, PTA+S L SFA and pop, PTA L PTA by Dr. Gilda Crease, vascular surgery. PCA Dilaudid for pain control Bowel regimen   History of bladder cancer, postresection History of prostate cancer Male urinary stress incontinence status post artificial urinary sphincter placement on 03/09/2022 by urology. The patient has a controller in his left inguinal region.  Leukocytosis, possibly reactive in the setting of surgery WBC 20.99, afebrile MRSA screening test negative Monitor CBC and fever curve.   AKI, multifactorial Baseline creatinine appears to be 1.1 with GFR greater than 60. Presented with creatinine 1.40 GFR 52  Cr is up-trending 1.24 from 1.16 Currently on IV fluid post CTA LLE Continue to closely monitor urine output with strict I's and O's Continue to avoid nephrotoxic agents, dehydration and hypotension. Repeat renal panel in the morning.   Coronary artery disease status post PCI with stenting Resume home Plavix when ok with vascular surgery. Home pravastatin held. Denies any anginal symptoms. Continue to monitor on telemetry.   History of DVTs/PE Hemophilia, unspecified Heparin drip as recommended by vasc surgery Will need to closely follow up with hematology outpatient  Thrombocytosis, likely reactive Presented with platelet count of 432k>> 409K>>413K   Essential hypertension Resume home oral antihypertensives when appropriate Avoid hypotension. Continue to closely monitor vital signs   Hypothyroidism Continue home Synthroid.   GERD Continue home PPI.       Critical care time:  65 minutes       DVT prophylaxis: Heparin drip  Code Status: Full code   Family Communication: Wife at bedside this morning.   Disposition Plan: Likely will discharge to  home with home health services once he is hemodynamically stable and his pain is controlled.   Consults called: Vascular surgery    Admission status: Inpatient status.     Status is: Inpatient The patient  requires at least 2 midnights for further evaluation and treatment of present condition.     Objective: Vitals:   03/13/22 0900 03/13/22 1015 03/13/22 1100 03/13/22 1227  BP: (!) 121/47 (!) 145/82 136/88   Pulse: 79 67 69   Resp: 15 10 (!) 9 13  Temp:      TempSrc:      SpO2: 95% 91% 96% 95%  Weight:      Height:        Intake/Output Summary (Last 24 hours) at 03/13/2022 1404 Last data filed at 03/13/2022 1227 Gross per 24 hour  Intake 2473.33 ml  Output 1540 ml  Net 933.33 ml   Filed Weights   03/11/22 1621 03/11/22 1915  Weight: 77.6 kg 79.3 kg    Exam:  General: 76 y.o. year-old male Frail appearing in No acute distress.  Alert and oriented x 3. Cardiovascular: Regular rate and rhythm.  No rubs or gallops. Respiratory:  Clear to auscultation.  No wheezes or rales. Poor inspiratory efforts. Abdomen: Soft non tender bowel sounds present. Musculoskeletal: No lower extremities edema.  Difficult to palpate LLE pulses.  Skin: No ulcerative lesions. Psychiatry: Mood is appropriate for condition and setting.   Data Reviewed: CBC: Recent Labs  Lab 03/11/22 1258 03/12/22 0427 03/13/22 0114  WBC 14.4* 13.5* 20.9*  NEUTROABS 10.6*  --  17.4*  HGB 16.2 15.4 14.6  HCT 53.1* 49.7 46.4  MCV 88.2 87.2 86.6  PLT 432* 409* 413*   Basic Metabolic Panel: Recent Labs  Lab 03/11/22 1258 03/12/22 0427 03/13/22 0114  NA 136 136 134*  K 4.3 4.3 4.4  CL 104 108 105  CO2 25 25 23   GLUCOSE 133* 104* 171*  BUN 25* 19 21  CREATININE 1.40* 1.16 1.24  CALCIUM 9.2 8.7* 8.7*  MG  --   --  1.9  PHOS  --   --  5.0*   GFR: Estimated Creatinine Clearance: 51.5 mL/min (by C-G formula based on SCr of 1.24 mg/dL). Liver Function Tests: Recent Labs  Lab 03/11/22 1258 03/13/22 0114  AST 33 23  ALT 25 19  ALKPHOS 48 43  BILITOT 0.9 0.7  PROT 7.1 5.9*  ALBUMIN 3.8 3.2*   No results for input(s): "LIPASE", "AMYLASE" in the last 168 hours. No results for input(s): "AMMONIA" in  the last 168 hours. Coagulation Profile: Recent Labs  Lab 03/11/22 1258  INR 1.2   Cardiac Enzymes: No results for input(s): "CKTOTAL", "CKMB", "CKMBINDEX", "TROPONINI" in the last 168 hours. BNP (last 3 results) No results for input(s): "PROBNP" in the last 8760 hours. HbA1C: Recent Labs    03/11/22 1306  HGBA1C 6.4*   CBG: Recent Labs  Lab 03/12/22 1614 03/12/22 2335 03/13/22 0045 03/13/22 0749 03/13/22 1131  GLUCAP 146* 182* 181* 148* 146*   Lipid Profile: No results for input(s): "CHOL", "HDL", "LDLCALC", "TRIG", "CHOLHDL", "LDLDIRECT" in the last 72 hours. Thyroid Function Tests: No results for input(s): "TSH", "T4TOTAL", "FREET4", "T3FREE", "THYROIDAB" in the last 72 hours. Anemia Panel: No results for input(s): "VITAMINB12", "FOLATE", "FERRITIN", "TIBC", "IRON", "RETICCTPCT" in the last 72 hours. Urine analysis:    Component Value Date/Time   COLORURINE YELLOW (A) 02/13/2022 0744   APPEARANCEUR HAZY (A) 02/13/2022 0744   APPEARANCEUR Clear 10/25/2014 2206   LABSPEC  1.018 02/13/2022 0744   LABSPEC 1.016 10/25/2014 2206   PHURINE 5.0 02/13/2022 0744   GLUCOSEU NEGATIVE 02/13/2022 0744   GLUCOSEU >=500 10/25/2014 2206   HGBUR NEGATIVE 02/13/2022 0744   BILIRUBINUR NEGATIVE 02/13/2022 0744   BILIRUBINUR Negative 10/25/2014 2206   KETONESUR NEGATIVE 02/13/2022 0744   PROTEINUR NEGATIVE 02/13/2022 0744   NITRITE NEGATIVE 02/13/2022 0744   LEUKOCYTESUR NEGATIVE 02/13/2022 0744   LEUKOCYTESUR Negative 10/25/2014 2206   Sepsis Labs: @LABRCNTIP (procalcitonin:4,lacticidven:4)  ) Recent Results (from the past 240 hour(s))  MRSA Next Gen by PCR, Nasal     Status: None   Collection Time: 03/11/22  7:30 PM   Specimen: Nasal Mucosa; Nasal Swab  Result Value Ref Range Status   MRSA by PCR Next Gen NOT DETECTED NOT DETECTED Final    Comment: (NOTE) The GeneXpert MRSA Assay (FDA approved for NASAL specimens only), is one component of a comprehensive MRSA  colonization surveillance program. It is not intended to diagnose MRSA infection nor to guide or monitor treatment for MRSA infections. Test performance is not FDA approved in patients less than 72 years old. Performed at [facility], [address] [place], [place] [address]       Studies: CT ANGIO LOW EXTREM LEFT W &/OR WO CONTRAST  Result Date: 03/12/2022 CLINICAL DATA:  Persistent ischemia of the left lower extremity. EXAM: CT ANGIOGRAPHY LOWER LEFT EXTREMITY TECHNIQUE: Multidetector CT imaging of the abdomen, pelvis and left lower extremities was performed using the standard protocol during bolus administration of intravenous contrast. Multiplanar CT image reconstructions and MIPs were obtained to evaluate the vascular anatomy. Multidetector CT imaging of the abdomen, pelvis and lower extremities was performed using the standard protocol during bolus administration of intravenous contrast. Multiplanar CT image reconstructions and MIPs were obtained to evaluate the vascular anatomy. CONTRAST:  OMNIPAQUE IOHEXOL 350 MG/ML SOLN COMPARISON:  CT angiography of abdominal aorta with iliofemoral runoff dated 03/11/2022. FINDINGS: VASCULAR Aorta: Normal caliber without aneurysm or dissection. Scattered atherosclerotic calcifications. No significant stenosis. IMA: Stenosis at the origin due to aortic plaque. IMA is patent. LEFT Lower Extremity Inflow: Common, internal and external iliac arteries are patent without evidence of aneurysm, dissection, vasculitis or significant stenosis. Outflow: Left common femoral artery is patent. Left profunda femoral arteries are patent. Left SFA is patent with mild atherosclerotic disease. Nonocclusive filling defect in the distal left SFA on Image 161/4. Interval placement of vascular stent within left popliteal artery at the level of the knee across the site of previous intraluminal thrombus. The stented portion of the popliteal artery appears patent.  Runoff: As noted previously, the proximal and mid anterior tibial artery are patent. Signs of thromboembolic disease in the runoff vessels of the left lower extremity are again noted including: -Persistent abrupt occlusion in the distal anterior tibial artery, image 313/4. -Persistent distal thrombus in the posterior tibial artery noted, image 308/4. -persistent distal occlusive thrombus in the peroneal artery with signs of distal reconstitution, image 320/4. Veins: No obvious venous abnormality within the limitations of this arterial phase study. Review of the MIP images confirms the above findings. Non-vascular Bowel: Scattered colonic diverticula. No acute abnormality involving the appendix. No evidence for bowel dilatation or focal bowel inflammation. Lymphatic: No lymph node enlargement in the abdomen or pelvis. Reproductive: Patient has penile implants within old fluid reservoir on right side and a new fluid reservoir on the left. Subcutaneous gas around this new left device is compatible with recent surgery. There is probably a small prostate  with postoperative changes. Other: Negative for free fluid. Negative for free air. Subcutaneous gas extending into the left scrotum compatible with prior surgery. Cannot exclude a small amount of fluid in left scrotum. Musculoskeletal: No acute bone abnormality. IMPRESSION: 1. Interval placement of vascular stent within the left popliteal artery at the level of the knee across the site of previous intraluminal thrombus. The stented portion of the popliteal artery appears patent. 2. Persistent occlusion of the distal left anterior tibial, posterior tibial arteries and peroneal artery compatible with thromboembolic disease. Distal reconstitution of the peroneal artery as noted previously. 3. Since the previous exam there are no signs to suggest acute proximal occlusion to account for the progressive left foot ischemia. 4. Nonocclusive filling defect within the distal left  SFA. 5. Aortic Atherosclerosis (ICD10-I70.0). Electronically Signed   By: Signa Kell M.D.   On: 03/12/2022 19:04    Scheduled Meds:  sodium chloride   Intravenous Once   Chlorhexidine Gluconate Cloth  6 each Topical Daily   [START ON 03/14/2022] clopidogrel  75 mg Oral Q breakfast   [START ON 03/14/2022] docusate sodium  100 mg Oral Daily   gabapentin  100 mg Oral TID   HYDROmorphone   Intravenous Q4H   insulin aspart  0-15 Units Subcutaneous TID WC   levothyroxine  50 mcg Oral Q0600   melatonin  5 mg Oral QHS   pantoprazole  40 mg Oral Daily   senna-docusate  2 tablet Oral BID   sodium chloride flush  3 mL Intravenous Q12H    Continuous Infusions:  sodium chloride     sodium chloride     sodium chloride Stopped (03/13/22 0056)   heparin 1,000 Units/hr (03/13/22 1100)   lactated ringers 50 mL/hr at 03/13/22 1000   magnesium sulfate bolus IVPB       LOS: 2 days     Darlin Drop, MD [facility] Pager [phone]  If 7PM-7AM, please contact night-coverage [email or web address] Password [facility] 03/13/2022, 2:04 PM

## 2022-03-13 NOTE — Progress Notes (Signed)
Patient arrived ack from surgery with new orders and old ones renewed.  Given bedside report by PACU RN's upon his return with a stated CBG = 182 which MD was aware of.  Recheck confirmed CBG 181  will continue to monitor.

## 2022-03-13 NOTE — Evaluation (Signed)
Physical Therapy Evaluation Patient Details Name: Phillip [name] MRN: [id number] DOB: [birth year] Today's Date: 03/13/2022  History of Present Illness  76 y.o. 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 Hyacinth Meeker, due to concern for critical left lower limb ischemia.  The patient had an artificial urinary sphincter placed on Wednesday, 03/09/2022.  His Eliquis was held a week prior to the procedure.  1 to 2 days after being off Eliquis he started having pain in his left lower extremity.  His left lower extremity pain became progressively worse, 12/10 in the last 24 hours.  Also endorses his skin turning cold and blue, involving his left toes.  Associated with inability to bear weight on his left lower extremity.  s/p L LE thrombectomy (6/23 and 6/24).  Awaiting recommendations for long-term management of condition (possible amputation?)  Clinical Impression  Patient resting in bed upon arrival to room; alert and oriented, follows commands and agreeable to participation with session.  Endorses significant pain to L LE, rated 9-10/10; however, wishes to attempt mobility as tolerated.  Demonstrates limited strength (2 to 3+/5) and ROM to L LE, limited by pain; generalized paresthesia knee distally.  Some discoloration to plantar surface of foot and digits 2-5 visible.  Able to complete bed mobility with mod indep; sit/stand, static standing balance with RW, cga/min assist. Efforts to achieve L foot flat (slightly ahead of BOS, slightly PF position), emphasis on overall postural extension and standing tolerance.  Very heavy WBing bilat UEs, minimal WBing L LE.  Unable to tolerate additional gait at this time; will continue to  assess/progress as appropriate. Would benefit from skilled PT to address above deficits and promote optimal return to PLOF.; recommend transition to STR upon discharge from acute hospitalization.      Recommendations for follow up therapy are one component of a multi-disciplinary discharge planning process, led by the attending physician.  Recommendations may be updated based on patient status, additional functional criteria and insurance authorization.  Follow Up Recommendations Skilled nursing-short term rehab (<3 hours/day) (pending additional mobility assessment as pain allows (and continued medical POC)) Can patient physically be transported by private vehicle: Yes    Assistance Recommended at Discharge PRN  Patient can return home with the following  A little help with walking and/or transfers;A little help with bathing/dressing/bathroom    Equipment Recommendations Rolling walker (2 wheels)  Recommendations for Other Services       Functional Status Assessment Patient has had a recent decline in their functional status and demonstrates the ability to make significant improvements in function in a reasonable and predictable amount of time.     Precautions / Restrictions Precautions Precautions: Fall Restrictions Weight Bearing Restrictions: No      Mobility  Bed Mobility Overal bed mobility: Modified Independent Bed Mobility: Supine to Sit, Sit to Supine     Supine to sit: Modified independent (Device/Increase time) Sit to supine: Modified independent (Device/Increase time)   General bed mobility comments: increased time and effort, but able to complete without physical assist    Transfers Overall transfer level: Needs assistance Equipment used: Rolling walker (2 wheels) Transfers: Sit to/from Stand Sit to Stand: Min guard           General transfer comment: Static standing with efforts to achieve  L foot flat (slightly ahead of BOS, slightly PF position),  emphasis on overall postural extension and standing tolerance.  Very heavy WBing bilat UEs, minimal WBing L LE    Ambulation/Gait               General Gait Details: unable to tolerate due to pain  Stairs            Wheelchair Mobility    Modified Rankin (Stroke Patients Only)       Balance Overall balance assessment: Needs assistance Sitting-balance support: No upper extremity supported, Feet supported Sitting balance-Leahy Scale: Good     Standing balance support: Bilateral upper extremity supported Standing balance-Leahy Scale: Fair                               Pertinent Vitals/Pain Pain Assessment Pain Assessment: Faces Pain Score: 7  Pain Location: L LE Pain Descriptors / Indicators: Discomfort, Grimacing, Guarding Pain Intervention(s): Limited activity within patient's tolerance, Monitored during session, Premedicated before session, Repositioned    Home Living Family/patient expects to be discharged to:: Private residence Living Arrangements: Spouse/significant other Available Help at Discharge: Available 24 hours/day Type of Home: Other(Comment) Home Access: Stairs to enter Entrance Stairs-Rails: Right Entrance Stairs-Number of Steps: 1   Home Layout: Two level;Able to live on main level with bedroom/bathroom Home Equipment: Rolling Walker (2 wheels);Cane - single point;Shower seat      Prior Function Prior Level of Function : Independent/Modified Independent             Mobility Comments: Indep with ADLs, household and community mobilization without assist device; working part-time on golf course, enjoys staying active.       Hand Dominance        Extremity/Trunk Assessment   Upper Extremity Assessment Upper Extremity Assessment: Overall WFL for tasks assessed (remote history of RTC issues, was receiving PT prior to admission)    Lower Extremity Assessment Lower Extremity Assessment: Generalized weakness (L LE  movement limited by pain, knee ROM (0-90 degrees), L ankle ROM (0-90 degrees, act assist).  Flexes/extends L great toe, minimal movement of digits 2-5 with visible discoloration to toes/nail beds)       Communication   Communication: No difficulties  Cognition Arousal/Alertness: Awake/alert Behavior During Therapy: WFL for tasks assessed/performed Overall Cognitive Status: Within Functional Limits for tasks assessed                                 General Comments: Pt is A &O x4 and very pleasant and cooperative, motivated to "get past this" and do what he is able        General Comments      Exercises Other Exercises Other Exercises: Educated on positioning and ROM to L LE as tolerated (toe flex/ext, ankle PF/DR, hip and knee flex/ext), encouraged position change to avoid sustained pressure through L heel; patient voiced understanding.   Assessment/Plan    PT Assessment Patient needs continued PT services  PT Problem List Decreased strength;Decreased range of motion;Decreased activity tolerance;Decreased balance;Decreased mobility;Decreased coordination;Decreased knowledge of use of DME;Decreased safety awareness;Decreased knowledge of precautions;Decreased skin integrity;Pain       PT Treatment Interventions DME instruction;Gait training;Stair training;Functional mobility training;Therapeutic activities;Therapeutic exercise;Balance training;Patient/family education    PT Goals (Current goals can be found in the Care Plan section)  Acute Rehab PT Goals Patient Stated Goal: to get  past this and continue to be active PT Goal Formulation: With patient Time For Goal Achievement: 03/27/22 Potential to Achieve Goals: Good    Frequency Min 2X/week     Co-evaluation               AM-PAC PT "6 Clicks" Mobility  Outcome Measure Help needed turning from your back to your side while in a flat bed without using bedrails?: None Help needed moving from lying on  your back to sitting on the side of a flat bed without using bedrails?: None Help needed moving to and from a bed to a chair (including a wheelchair)?: A Little Help needed standing up from a chair using your arms (e.g., wheelchair or bedside chair)?: A Little Help needed to walk in hospital room?: A Little Help needed climbing 3-5 steps with a railing? : A Lot 6 Click Score: 19    End of Session Equipment Utilized During Treatment: Gait belt;Oxygen Activity Tolerance: Patient tolerated treatment well Patient left: in bed;with call bell/phone within reach;with family/visitor present Nurse Communication: Mobility status PT Visit Diagnosis: Other abnormalities of gait and mobility (R26.89);Muscle weakness (generalized) (M62.81);Pain Pain - Right/Left: Left Pain - part of body: Leg    Time:  -      Charges:   PT Evaluation $PT Eval Moderate Complexity: 1 Mod PT Treatments $Therapeutic Exercise: 8-22 mins        Shion Bluestein H. Manson Passey, PT, DPT, NCS 03/13/22, 2:35 PM [phone]

## 2022-03-14 ENCOUNTER — Encounter: Payer: Self-pay | Admitting: Vascular Surgery

## 2022-03-14 DIAGNOSIS — I7090 Unspecified atherosclerosis: Secondary | ICD-10-CM | POA: Diagnosis not present

## 2022-03-14 LAB — BASIC METABOLIC PANEL
Anion gap: 6 (ref 5–15)
BUN: 19 mg/dL (ref 8–23)
CO2: 25 mmol/L (ref 22–32)
Calcium: 8.7 mg/dL — ABNORMAL LOW (ref 8.9–10.3)
Chloride: 104 mmol/L (ref 98–111)
Creatinine, Ser: 1.26 mg/dL — ABNORMAL HIGH (ref 0.61–1.24)
GFR, Estimated: 59 mL/min — ABNORMAL LOW (ref >60)
Glucose, Bld: 130 mg/dL — ABNORMAL HIGH (ref 70–99)
Potassium: 4.4 mmol/L (ref 3.5–5.1)
Sodium: 135 mmol/L (ref 135–145)

## 2022-03-14 LAB — CBC
HCT: 44.4 % (ref 39.0–52.0)
Hemoglobin: 13.8 g/dL (ref 13.0–17.0)
MCH: 27.6 pg (ref 26.0–34.0)
MCHC: 31.1 g/dL (ref 30.0–36.0)
MCV: 88.8 fL (ref 80.0–100.0)
Platelets: 410 10*3/uL — ABNORMAL HIGH (ref 150–400)
RBC: 5 MIL/uL (ref 4.22–5.81)
RDW: 15.9 % — ABNORMAL HIGH (ref 11.5–15.5)
WBC: 15.1 10*3/uL — ABNORMAL HIGH (ref 4.0–10.5)
nRBC: 0 % (ref 0.0–0.2)

## 2022-03-14 LAB — GLUCOSE, CAPILLARY
Glucose-Capillary: 119 mg/dL — ABNORMAL HIGH (ref 70–99)
Glucose-Capillary: 144 mg/dL — ABNORMAL HIGH (ref 70–99)
Glucose-Capillary: 117 mg/dL — ABNORMAL HIGH (ref 70–99)
Glucose-Capillary: 180 mg/dL — ABNORMAL HIGH (ref 70–99)

## 2022-03-14 LAB — HEPARIN LEVEL (UNFRACTIONATED): Heparin Unfractionated: >1.1 IU/mL — ABNORMAL HIGH (ref 0.30–0.70)

## 2022-03-14 LAB — APTT: aPTT: 86 seconds — ABNORMAL HIGH (ref 24–36)

## 2022-03-14 MED ORDER — TIROFIBAN (AGGRASTAT) BOLUS VIA INFUSION
25.0000 ug/kg | Freq: Once | INTRAVENOUS | Status: AC
Start: 2022-03-14 — End: 2022-03-14
  Administered 2022-03-14: 1982.5 ug via INTRAVENOUS
  Filled 2022-03-14: qty 40

## 2022-03-14 MED ORDER — TIROFIBAN HCL IV 12.5 MG/250 ML
0.0750 ug/kg/min | INTRAVENOUS | Status: AC
  Administered 2022-03-14: 0.075 ug/kg/min via INTRAVENOUS
  Filled 2022-03-14 (×2): qty 250

## 2022-03-15 DIAGNOSIS — I70248 Atherosclerosis of native arteries of left leg with ulceration of other part of lower left leg: Secondary | ICD-10-CM

## 2022-03-15 DIAGNOSIS — I7090 Unspecified atherosclerosis: Secondary | ICD-10-CM | POA: Diagnosis not present

## 2022-03-15 DIAGNOSIS — I10 Essential (primary) hypertension: Secondary | ICD-10-CM | POA: Diagnosis not present

## 2022-03-15 DIAGNOSIS — N179 Acute kidney failure, unspecified: Secondary | ICD-10-CM | POA: Diagnosis not present

## 2022-03-15 LAB — GLUCOSE, CAPILLARY
Glucose-Capillary: 144 mg/dL — ABNORMAL HIGH (ref 70–99)
Glucose-Capillary: 162 mg/dL — ABNORMAL HIGH (ref 70–99)
Glucose-Capillary: 165 mg/dL — ABNORMAL HIGH (ref 70–99)
Glucose-Capillary: 141 mg/dL — ABNORMAL HIGH (ref 70–99)

## 2022-03-15 LAB — CBC WITH DIFFERENTIAL/PLATELET
Abs Immature Granulocytes: 0.14 10*3/uL — ABNORMAL HIGH (ref 0.00–0.07)
Basophils Absolute: 0.1 10*3/uL (ref 0.0–0.1)
Basophils Relative: 1 %
Eosinophils Absolute: 0.4 10*3/uL (ref 0.0–0.5)
Eosinophils Relative: 2 %
HCT: 45.2 % (ref 39.0–52.0)
Hemoglobin: 14.1 g/dL (ref 13.0–17.0)
Immature Granulocytes: 1 %
Lymphocytes Relative: 10 %
Lymphs Abs: 1.8 10*3/uL (ref 0.7–4.0)
MCH: 27.8 pg (ref 26.0–34.0)
MCHC: 31.2 g/dL (ref 30.0–36.0)
MCV: 89 fL (ref 80.0–100.0)
Monocytes Absolute: 1.3 10*3/uL — ABNORMAL HIGH (ref 0.1–1.0)
Monocytes Relative: 8 %
Neutro Abs: 13.8 10*3/uL — ABNORMAL HIGH (ref 1.7–7.7)
Neutrophils Relative %: 78 %
Platelets: 518 10*3/uL — ABNORMAL HIGH (ref 150–400)
RBC: 5.08 MIL/uL (ref 4.22–5.81)
RDW: 15.9 % — ABNORMAL HIGH (ref 11.5–15.5)
WBC: 17.6 10*3/uL — ABNORMAL HIGH (ref 4.0–10.5)
nRBC: 0 % (ref 0.0–0.2)

## 2022-03-15 LAB — APTT
aPTT: 35 seconds (ref 24–36)
aPTT: 44 seconds — ABNORMAL HIGH (ref 24–36)

## 2022-03-15 LAB — BASIC METABOLIC PANEL
Anion gap: 7 (ref 5–15)
BUN: 24 mg/dL — ABNORMAL HIGH (ref 8–23)
CO2: 25 mmol/L (ref 22–32)
Calcium: 8.5 mg/dL — ABNORMAL LOW (ref 8.9–10.3)
Chloride: 105 mmol/L (ref 98–111)
Creatinine, Ser: 1.16 mg/dL (ref 0.61–1.24)
GFR, Estimated: >60 mL/min (ref >60)
Glucose, Bld: 135 mg/dL — ABNORMAL HIGH (ref 70–99)
Potassium: 4.4 mmol/L (ref 3.5–5.1)
Sodium: 137 mmol/L (ref 135–145)

## 2022-03-15 LAB — MAGNESIUM: Magnesium: 2 mg/dL (ref 1.7–2.4)

## 2022-03-15 LAB — TYPE AND SCREEN
ABO/RH(D): A NEG
Antibody Screen: NEGATIVE

## 2022-03-15 LAB — SURGICAL PATHOLOGY

## 2022-03-15 LAB — PHOSPHORUS: Phosphorus: 3.8 mg/dL (ref 2.5–4.6)

## 2022-03-15 MED ORDER — CEFAZOLIN SODIUM-DEXTROSE 2-4 GM/100ML-% IV SOLN
2.0000 g | INTRAVENOUS | Status: AC
  Administered 2022-03-16: 2 g via INTRAVENOUS

## 2022-03-15 MED ORDER — LACTULOSE 10 GM/15ML PO SOLN
20.0000 g | Freq: Once | ORAL | Status: AC
  Administered 2022-03-15: 20 g via ORAL
  Filled 2022-03-15: qty 30

## 2022-03-15 MED ORDER — HEPARIN (PORCINE) 25000 UT/250ML-% IV SOLN
1200.0000 [IU]/h | INTRAVENOUS | Status: DC
Start: 2022-03-15 — End: 2022-03-16
  Administered 2022-03-15: 1000 [IU]/h via INTRAVENOUS
  Administered 2022-03-16: 1200 [IU]/h via INTRAVENOUS
  Filled 2022-03-15 (×2): qty 250

## 2022-03-15 MED ORDER — HEPARIN BOLUS VIA INFUSION
2250.0000 [IU] | Freq: Once | INTRAVENOUS | Status: AC
  Administered 2022-03-15: 2250 [IU] via INTRAVENOUS
  Filled 2022-03-15: qty 2250

## 2022-03-15 NOTE — Progress Notes (Signed)
ANTICOAGULATION CONSULT NOTE  Pharmacy Consult for aggrastat Indication:  peripheral vascular dx  Allergies  Allergen Reactions   Doxazosin Other (See Comments)    Other reaction(s): Unknown    Patient Measurements: Height: 5\' 9"  (175.3 cm) Weight: 79.3 kg (174 lb 13.2 oz) IBW/kg (Calculated) : 70.7 Heparin Dosing Weight: 77.1kg  Vital Signs: Temp: 99.9 F (37.7 C) (06/27 2000) Temp Source: Oral (06/27 2000) BP: 137/89 (06/27 2200) Pulse Rate: 77 (06/27 2200)  Labs: Recent Labs    03/13/22 0114 03/13/22 1156 03/14/22 0341 03/14/22 0345 03/15/22 0308 03/15/22 2200  HGB 14.6  --   --  13.8 14.1  --   HCT 46.4  --   --  44.4 45.2  --   PLT 413*  --   --  410* 518*  --   APTT >160*   < >  --  86* 35 44*  HEPARINUNFRC >1.10*  --   --  >1.10*  --   --   CREATININE 1.24  --  1.26*  --  1.16  --    < > = values in this interval not displayed.     Estimated Creatinine Clearance: 55 mL/min (by C-G formula based on SCr of 1.16 mg/dL).   Medical History: Past Medical History:  Diagnosis Date   Arthritis    Basal cell carcinoma    L clavicle, L prox forearm- removed years ago    Bladder cancer (HCC)    Bladder neck contracture    CAD (coronary artery disease)    a. 12/2020 NSTEMI/Cath: LM nl, LAD 95ost/p, LCX 50p, RCA nl. EF 45-50%.   Cataract    CKD (chronic kidney disease), stage III [facility])    ED (erectile dysfunction)    Frequency    GERD (gastroesophageal reflux disease)    History of pulmonary embolus (PE) 11/2018   a. Following LE DVT-->Chronic warfarin.   Hyperlipidemia LDL goal <70    Hypertension    Hypothyroidism    Incontinence of urine    sui, s/p cryoablation   Ischemic cardiomyopathy    a. 11/2018 Echo: EF 60-65%; b. 12/2020 LV Gram: EF 45-50% in setting of NSTEMI.   Kidney stones    Neuropathy    feet   Nocturia    Prostate cancer (HCC)    S/P   CRYOABLATION   Right elbow tendinitis    Right Lower Extremity DVT (deep venous thrombosis) (HCC)  11/25/2018   Vertigo    1-2x/yr   Wears glasses     Medications:  Scheduled:   Chlorhexidine Gluconate Cloth  6 each Topical Daily   docusate sodium  100 mg Oral Daily   HYDROmorphone   Intravenous Q4H   insulin aspart  0-15 Units Subcutaneous TID WC   levothyroxine  50 mcg Oral Q0600   melatonin  5 mg Oral QHS   metoprolol tartrate  12.5 mg Oral BID   pantoprazole  40 mg Oral Daily   senna-docusate  2 tablet Oral BID   sodium chloride flush  3 mL Intravenous Q12H   Infusions:   sodium chloride     sodium chloride     sodium chloride Stopped (03/13/22 0056)   [START ON 03/16/2022]  ceFAZolin (ANCEF) IV     heparin 1,000 Units/hr (03/15/22 1800)   magnesium sulfate bolus IVPB    Heparin Dosing Weight: 77.1kg  Assessment: 76 y.o. 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 Patient recently having discontinued this about 1 week ago  in preparation for placement of an artificial urinary sphincter procedure on on 03/09/22 and was instructed to continue to hold Eliquis 48 hours post-op. Presents to ED on 03/11/22 with pain and duskiness in the left foot extending towards the left calf. Pharmacy has been consulted for heparin infusion for VTE.   S/P thrombectomy 6/23, additional thromboembolectomy on 6/24 PM.  Pt was on heparin gtt since 6/24 @ ~ 2000. Pt was on Eliquis 10 mg PO BID, last dose on 6/24 @ 1150. On 6/26 he was transitioned to aggrastat drip x 18 hrs with the hope for improving his forefoot perfusion, which is complete and now heparin is being restarted  Date Time aPTT/HL Rate/Comment 6/27 2200 44s  Subtherapeutic; 1000 > 1200 un/hr     Goal of Therapy:  Heparin level 0.3-0.7 units/ml aPTT 66-102 seconds Monitor platelets by anticoagulation protocol: Yes   Plan:  Subtherapeutic aPTT 44s; will get anti-xa & aPTT at next interval check. Based on prior 1300 un/hr was supratherapeutic, will increase more modestly.  Bolus 2250 units x1; then increase  heparin infusion rate to 1200 units/hr  S/p Aggrastat infusion (end 6/27 1300) Check aPTT 8 hours after rate change Daily CBC while on IV heparin  Sherlynn Carbon Eulia Hatcher 03/15/2022,10:35 PM

## 2022-03-15 NOTE — TOC Progression Note (Signed)
Transition of Care [facility]) - Progression Note    Patient Details  Name: Phillip [name] MRN: [id number] Date of Birth: 08/12/1946  Transition of [facility]) CM/SW Contact  Caryn Section, RN Phone Number: 03/15/2022, 2:02 PM  Clinical Narrative:   Patient is on medical therapy that prohibits PT/OT today.  Anticipating patient therapy in upcoming days for evaluation and continued recommendation.         Expected Discharge Plan and Services                                                 Social Determinants of Health (SDOH) Interventions    Readmission Risk Interventions     No data to display

## 2022-03-15 NOTE — H&P (View-Only) (Signed)
[facility]  Daily Progress Note   Subjective  - 3 Days Post-Op  Patient has now completed approximately 12 hours of Aggrastat.  Unfortunately he does not feel that his foot is improved and he is still having profound pain  Objective Vitals:   03/15/22 1000 03/15/22 1100 03/15/22 1135 03/15/22 1200  BP: (!) 132/92 127/82  140/76  Pulse: 76 74  74  Resp: '14 14 16 12  '$ Temp:    98.8 F (37.1 C)  TempSrc:    Oral  SpO2: 96% 98% 98% 96%  Weight:      Height:        Intake/Output Summary (Last 24 hours) at 03/15/2022 1300 Last data filed at 03/15/2022 1200 Gross per 24 hour  Intake 441.49 ml  Output 200 ml  Net 241.49 ml    PULM  Normal effort , no use of accessory muscles CV  No JVD, RRR Abd      No distended, nontender VASC  the forefoot remains ischemic with greater than 5-second cap refill of the great toe.  Laboratory CBC    Component Value Date/Time   WBC 17.6 (H) 03/15/2022 0308   HGB 14.1 03/15/2022 0308   HGB 12.7 (L) 10/25/2014 2206   HCT 45.2 03/15/2022 0308   HCT 40.9 10/25/2014 2206   PLT 518 (H) 03/15/2022 0308   PLT 188 10/25/2014 2206    BMET    Component Value Date/Time   NA 137 03/15/2022 0308   NA 141 10/25/2014 2206   K 4.4 03/15/2022 0308   K 4.0 10/25/2014 2206   CL 105 03/15/2022 0308   CL 109 (H) 10/25/2014 2206   CO2 25 03/15/2022 0308   CO2 26 10/25/2014 2206   GLUCOSE 135 (H) 03/15/2022 0308   GLUCOSE 210 (H) 10/25/2014 2206   BUN 24 (H) 03/15/2022 0308   BUN 21 08/15/2016 1618   BUN 15 10/25/2014 2206   CREATININE 1.16 03/15/2022 0308   CREATININE 1.32 (H) 10/25/2014 2206   CALCIUM 8.5 (L) 03/15/2022 0308   CALCIUM 8.2 (L) 10/25/2014 2206   GFRNONAA >60 03/15/2022 0308   GFRNONAA 58 (L) 10/25/2014 2206   GFRAA >60 03/27/2019 0637   GFRAA >60 10/25/2014 2206    Assessment/Planning: POD #3 s/p open thrombectomy of the left lower extremity  At this point patient's foot remains profoundly ischemic.  I  believe we have exhausted both interventional and surgical options and that his forefoot is nonsalvageable.  I have recommended below-knee amputation.  We have discussed this together and the patient is in agreement and wishes to proceed.  I will post the case for tomorrow and move forward with left below-knee amputation.   Risks and benefits of been reviewed all questions answered patient has agreed to proceed   Phillip [name]  03/15/2022, 1:00 PM

## 2022-03-15 NOTE — Progress Notes (Signed)
  Progress Note   Patient: Phillip [name] HQI:[id number] DOB: 02-25-1946 DOA: 03/11/2022     4 DOS: the patient was seen and examined on 03/15/2022   Brief hospital course: Phillip [name] is a 76 y.o. male with medical history significant for recurrent DVTs, post operative PE on Eliquis, unspecified thrombophilia, hypertension, hyperlipidemia, nephrolithiasis, malignant neoplasm of prostate post resection, bladder cancer, osteoarthritis, coronary artery disease status post PCI with stenting on Plavix, chronic diastolic CHF, who presents to [facility] ED from his PCP's office, Dr. Hyacinth Meeker, due to concern for left critical lower limb ischemia.   Patient is seen by vascular surgery, had a lower extremity bypass surgery on 6/23.  Went back to the OR on 6/24 for L LE thrombectomy.  Patient also placed on heparin drip.  It appears that the patient left leg is nonsalvageable.  BKA scheduled on 6/28  Assessment and Plan: Left lower extremity ischemia. Status post revascularization and thrombectomy. Still has significant symptoms of ischemia, continue heparin drip.  Scheduled for BKA on 6/28. Continue symptomatic treatment.  History of bladder cancer s/p resection. History of prostate cancer. Urinary stress incontinence.  Status post artificial urinary sphincter on 6/21. Patient currently is able to urinate by herself.  Acute kidney injury. Condition appear improved.  Coronary artery disease status post PCI and stenting. Continue Plavix, statin.  History of DVT and PE. Reactive thrombocytosis. Acquired hemophilia. Continue heparin drip, may be able to change back to Eliquis after amputation.  Essential hypertension. Continue metoprolol.       Subjective:  Patient still has some pain in the left leg.  Otherwise doing well.  Physical Exam: Vitals:   03/15/22 1000 03/15/22 1100 03/15/22 1135 03/15/22 1200  BP: (!) 132/92 127/82  140/76  Pulse: 76 74  74  Resp: 14 14 16 12   Temp:    98.8  F (37.1 C)  TempSrc:    Oral  SpO2: 96% 98% 98% 96%  Weight:      Height:       General exam: Appears calm and comfortable  Respiratory system: Clear to auscultation. Respiratory effort normal. Cardiovascular system: S1 & S2 heard, RRR. No JVD, murmurs, rubs, gallops or clicks. No pedal edema. Gastrointestinal system: Abdomen is nondistended, soft and nontender. No organomegaly or masses felt. Normal bowel sounds heard. Central nervous system: Alert and oriented. No focal neurological deficits. Extremities: Symmetric 5 x 5 power. Skin: No rashes, lesions or ulcers Psychiatry: Judgement and insight appear normal. Mood & affect appropriate.   Data Reviewed:  Reviewed lab results  Family Communication: Wife updated at bedside.  Disposition: Status is: Inpatient Remains inpatient appropriate because: Severity of disease, inpatient procedure.  Planned Discharge Destination: Home with Home Health    Time spent: 55 minutes  Author: Marrion Coy, MD 03/15/2022 1:42 PM  For on call review [email or web address].

## 2022-03-15 NOTE — Hospital Course (Addendum)
Phillip [name] is a 76 y.o. male with medical history significant for recurrent DVTs, post operative PE on Eliquis, unspecified thrombophilia, hypertension, hyperlipidemia, nephrolithiasis, malignant neoplasm of prostate post resection, bladder cancer, osteoarthritis, coronary artery disease status post PCI with stenting on Plavix, chronic diastolic CHF, who presents to [facility] ED from his PCP's office, Dr. Sabra Heck, due to concern for left critical lower limb ischemia.   Patient is seen by vascular surgery, had a lower extremity bypass surgery on 6/23.  Went back to the OR on 6/24 for L LE thrombectomy.  Patient also placed on heparin drip.  It appears that the patient left leg is nonsalvageable.  BKA scheduled. 6/28.  Developed episode of PSVT, hypoxia.  Appears to be flash pulm edema, given IV Lasix. BKA performed on 6/28. 6/30.  Patient developed hypoxia and tachycardia again with atrial fibrillation.  CT angiogram was performed 6/29 showed bilateral PE.  Thrombectomy performed on 6/30, heparin drip restarted. 7/1.  Patient had another episode of hypoxemia, requiring high flow oxygen.  Duplex ultrasound showed bilateral DVT, IVC filter placed.

## 2022-03-16 ENCOUNTER — Inpatient Hospital Stay: Payer: Medicare HMO | Admitting: Anesthesiology

## 2022-03-16 ENCOUNTER — Inpatient Hospital Stay: Payer: Medicare HMO

## 2022-03-16 ENCOUNTER — Other Ambulatory Visit: Payer: Self-pay

## 2022-03-16 ENCOUNTER — Encounter: Payer: Self-pay | Admitting: Internal Medicine

## 2022-03-16 ENCOUNTER — Encounter
Admission: EM | Disposition: A | Discharge status: Skilled Nursing Facility | Payer: Self-pay | Source: Ambulatory Visit | Attending: Internal Medicine

## 2022-03-16 DIAGNOSIS — I5033 Acute on chronic diastolic (congestive) heart failure: Secondary | ICD-10-CM

## 2022-03-16 DIAGNOSIS — J9601 Acute respiratory failure with hypoxia: Secondary | ICD-10-CM | POA: Diagnosis not present

## 2022-03-16 DIAGNOSIS — I471 Supraventricular tachycardia: Secondary | ICD-10-CM

## 2022-03-16 DIAGNOSIS — I7090 Unspecified atherosclerosis: Secondary | ICD-10-CM | POA: Diagnosis not present

## 2022-03-16 HISTORY — PX: AMPUTATION: SHX166

## 2022-03-16 LAB — TYPE AND SCREEN
ABO/RH(D): A NEG
Antibody Screen: NEGATIVE
Unit division: 0
Unit division: 0

## 2022-03-16 LAB — BPAM RBC
Blood Product Expiration Date: 202307162359
Blood Product Expiration Date: 202307162359
Unit Type and Rh: 600
Unit Type and Rh: 600

## 2022-03-16 LAB — BASIC METABOLIC PANEL
Anion gap: 7 (ref 5–15)
BUN: 23 mg/dL (ref 8–23)
CO2: 24 mmol/L (ref 22–32)
Calcium: 8.8 mg/dL — ABNORMAL LOW (ref 8.9–10.3)
Chloride: 105 mmol/L (ref 98–111)
Creatinine, Ser: 1.11 mg/dL (ref 0.61–1.24)
GFR, Estimated: >60 mL/min (ref >60)
Glucose, Bld: 145 mg/dL — ABNORMAL HIGH (ref 70–99)
Potassium: 4.1 mmol/L (ref 3.5–5.1)
Sodium: 136 mmol/L (ref 135–145)

## 2022-03-16 LAB — GLUCOSE, CAPILLARY
Glucose-Capillary: 134 mg/dL — ABNORMAL HIGH (ref 70–99)
Glucose-Capillary: 136 mg/dL — ABNORMAL HIGH (ref 70–99)
Glucose-Capillary: 161 mg/dL — ABNORMAL HIGH (ref 70–99)
Glucose-Capillary: 195 mg/dL — ABNORMAL HIGH (ref 70–99)

## 2022-03-16 LAB — CBC
HCT: 41.5 % (ref 39.0–52.0)
Hemoglobin: 13 g/dL (ref 13.0–17.0)
MCH: 27.5 pg (ref 26.0–34.0)
MCHC: 31.3 g/dL (ref 30.0–36.0)
MCV: 87.7 fL (ref 80.0–100.0)
Platelets: 458 10*3/uL — ABNORMAL HIGH (ref 150–400)
RBC: 4.73 MIL/uL (ref 4.22–5.81)
RDW: 16 % — ABNORMAL HIGH (ref 11.5–15.5)
WBC: 17.4 10*3/uL — ABNORMAL HIGH (ref 4.0–10.5)
nRBC: 0 % (ref 0.0–0.2)

## 2022-03-16 LAB — MAGNESIUM: Magnesium: 2.1 mg/dL (ref 1.7–2.4)

## 2022-03-16 LAB — APTT: aPTT: 84 seconds — ABNORMAL HIGH (ref 24–36)

## 2022-03-16 LAB — HEPARIN LEVEL (UNFRACTIONATED): Heparin Unfractionated: 0.46 IU/mL (ref 0.30–0.70)

## 2022-03-16 LAB — TROPONIN I (HIGH SENSITIVITY): Troponin I (High Sensitivity): 311 ng/L (ref <18)

## 2022-03-16 LAB — PREPARE RBC (CROSSMATCH)

## 2022-03-16 SURGERY — AMPUTATION BELOW KNEE
Anesthesia: General | Site: Knee | Laterality: Left

## 2022-03-16 MED ORDER — FENTANYL CITRATE (PF) 100 MCG/2ML IJ SOLN: 25.0000 ug | INTRAMUSCULAR | Status: DC | PRN

## 2022-03-16 MED ORDER — PHENYLEPHRINE HCL-NACL 20-0.9 MG/250ML-% IV SOLN
INTRAVENOUS | Status: DC | PRN
  Administered 2022-03-16: 80 ug/min via INTRAVENOUS

## 2022-03-16 MED ORDER — FENTANYL CITRATE (PF) 100 MCG/2ML IJ SOLN
INTRAMUSCULAR | Status: DC | PRN
  Administered 2022-03-16 (×2): 50 ug via INTRAVENOUS

## 2022-03-16 MED ORDER — SUGAMMADEX SODIUM 200 MG/2ML IV SOLN
INTRAVENOUS | Status: DC | PRN
  Administered 2022-03-16: 200 mg via INTRAVENOUS

## 2022-03-16 MED ORDER — BUPIVACAINE LIPOSOME 1.3 % IJ SUSP
INTRAMUSCULAR | Status: AC
  Filled 2022-03-16: qty 20

## 2022-03-16 MED ORDER — PHENYLEPHRINE HCL (PRESSORS) 10 MG/ML IV SOLN
INTRAVENOUS | Status: DC | PRN
  Administered 2022-03-16: 160 ug via INTRAVENOUS
  Administered 2022-03-16 (×2): 80 ug via INTRAVENOUS
  Administered 2022-03-16: 160 ug via INTRAVENOUS

## 2022-03-16 MED ORDER — ONDANSETRON HCL 4 MG/2ML IJ SOLN
INTRAMUSCULAR | Status: DC | PRN
  Administered 2022-03-16: 4 mg via INTRAVENOUS

## 2022-03-16 MED ORDER — ACETAMINOPHEN 10 MG/ML IV SOLN
INTRAVENOUS | Status: DC | PRN
  Administered 2022-03-16: 1000 mg via INTRAVENOUS

## 2022-03-16 MED ORDER — PROPOFOL 10 MG/ML IV BOLUS
INTRAVENOUS | Status: DC | PRN
  Administered 2022-03-16: 200 mg via INTRAVENOUS

## 2022-03-16 MED ORDER — ACETAMINOPHEN 10 MG/ML IV SOLN
INTRAVENOUS | Status: AC
  Filled 2022-03-16: qty 100

## 2022-03-16 MED ORDER — ROCURONIUM BROMIDE 100 MG/10ML IV SOLN
INTRAVENOUS | Status: DC | PRN
  Administered 2022-03-16: 40 mg via INTRAVENOUS
  Administered 2022-03-16 (×2): 10 mg via INTRAVENOUS

## 2022-03-16 MED ORDER — CEFAZOLIN SODIUM-DEXTROSE 2-4 GM/100ML-% IV SOLN
INTRAVENOUS | Status: AC
  Filled 2022-03-16: qty 100

## 2022-03-16 MED ORDER — LACTATED RINGERS IV SOLN: INTRAVENOUS | Status: DC | PRN

## 2022-03-16 MED ORDER — FENTANYL CITRATE (PF) 100 MCG/2ML IJ SOLN
INTRAMUSCULAR | Status: AC
  Filled 2022-03-16: qty 2

## 2022-03-16 MED ORDER — OXYCODONE HCL 5 MG/5ML PO SOLN: 5.0000 mg | Freq: Once | ORAL | Status: DC | PRN

## 2022-03-16 MED ORDER — OXYCODONE HCL 5 MG PO TABS: 5.0000 mg | ORAL_TABLET | Freq: Once | ORAL | Status: DC | PRN

## 2022-03-16 MED ORDER — ONDANSETRON HCL 4 MG/2ML IJ SOLN: 4.0000 mg | Freq: Once | INTRAMUSCULAR | Status: DC | PRN

## 2022-03-16 MED ORDER — HYDROMORPHONE HCL 1 MG/ML IJ SOLN
INTRAMUSCULAR | Status: AC
  Filled 2022-03-16: qty 1

## 2022-03-16 MED ORDER — HEPARIN SODIUM (PORCINE) 5000 UNIT/ML IJ SOLN
5000.0000 [IU] | Freq: Once | INTRAMUSCULAR | Status: AC
  Administered 2022-03-16: 5000 [IU] via SUBCUTANEOUS
  Filled 2022-03-16: qty 1

## 2022-03-16 MED ORDER — LIDOCAINE HCL (CARDIAC) PF 100 MG/5ML IV SOSY
PREFILLED_SYRINGE | INTRAVENOUS | Status: DC | PRN
  Administered 2022-03-16: 40 mg via INTRAVENOUS

## 2022-03-16 MED ORDER — ACETAMINOPHEN 10 MG/ML IV SOLN: 1000.0000 mg | Freq: Once | INTRAVENOUS | Status: DC | PRN

## 2022-03-16 MED ORDER — 0.9 % SODIUM CHLORIDE (POUR BTL) OPTIME
TOPICAL | Status: DC | PRN
  Administered 2022-03-16: 1500 mL

## 2022-03-16 MED ORDER — FUROSEMIDE 10 MG/ML IJ SOLN
40.0000 mg | Freq: Once | INTRAMUSCULAR | Status: AC
  Administered 2022-03-16: 40 mg via INTRAVENOUS
  Filled 2022-03-16: qty 4

## 2022-03-16 MED ORDER — LIDOCAINE HCL (PF) 2 % IJ SOLN
INTRAMUSCULAR | Status: AC
  Filled 2022-03-16: qty 5

## 2022-03-16 MED ORDER — SUCCINYLCHOLINE CHLORIDE 200 MG/10ML IV SOSY
PREFILLED_SYRINGE | INTRAVENOUS | Status: DC | PRN
  Administered 2022-03-16: 100 mg via INTRAVENOUS

## 2022-03-16 MED ORDER — BUPIVACAINE HCL (PF) 0.5 % IJ SOLN
INTRAMUSCULAR | Status: AC
  Filled 2022-03-16: qty 30

## 2022-03-16 MED ORDER — BUPIVACAINE LIPOSOME 1.3 % IJ SUSP
INTRAMUSCULAR | Status: DC | PRN
  Administered 2022-03-16: 70 mL

## 2022-03-16 MED ORDER — PHENYLEPHRINE 80 MCG/ML (10ML) SYRINGE FOR IV PUSH (FOR BLOOD PRESSURE SUPPORT)
PREFILLED_SYRINGE | INTRAVENOUS | Status: AC
  Filled 2022-03-16: qty 10

## 2022-03-16 MED ORDER — HYDROMORPHONE HCL 1 MG/ML IJ SOLN
INTRAMUSCULAR | Status: DC | PRN
  Administered 2022-03-16 (×2): .5 mg via INTRAVENOUS

## 2022-03-16 SURGICAL SUPPLY — 51 items
BAG COUNTER SPONGE SURGICOUNT (BAG) ×2 IMPLANT
BLADE SAGITTAL WIDE XTHICK NO (BLADE) ×2 IMPLANT
BLADE SURG SZ10 CARB STEEL (BLADE) ×2 IMPLANT
BNDG COHESIVE 4X5 TAN ST LF (GAUZE/BANDAGES/DRESSINGS) ×2 IMPLANT
BNDG ELASTIC 4X5.8 VLCR NS LF (GAUZE/BANDAGES/DRESSINGS) ×2 IMPLANT
BNDG GAUZE DERMACEA FLUFF (GAUZE/BANDAGES/DRESSINGS) ×1
BNDG GAUZE DERMACEA FLUFF 4 (GAUZE/BANDAGES/DRESSINGS) ×1 IMPLANT
BRUSH SCRUB EZ  4% CHG (MISCELLANEOUS) ×1
BRUSH SCRUB EZ 4% CHG (MISCELLANEOUS) ×1 IMPLANT
CHLORAPREP W/TINT 26 (MISCELLANEOUS) ×2 IMPLANT
DERMABOND ADVANCED (GAUZE/BANDAGES/DRESSINGS) ×2
DERMABOND ADVANCED .7 DNX12 (GAUZE/BANDAGES/DRESSINGS) ×2 IMPLANT
DRAPE INCISE IOBAN 66X45 STRL (DRAPES) ×2 IMPLANT
DRSG GAUZE FLUFF 36X18 (GAUZE/BANDAGES/DRESSINGS) ×2 IMPLANT
ELECT CAUTERY BLADE 6.4 (BLADE) ×2 IMPLANT
ELECT REM PT RETURN 9FT ADLT (ELECTROSURGICAL) ×2
ELECTRODE REM PT RTRN 9FT ADLT (ELECTROSURGICAL) ×1 IMPLANT
GLOVE BIO SURGEON STRL SZ7 (GLOVE) ×2 IMPLANT
GLOVE SURG SYN 8.0 (GLOVE) ×2 IMPLANT
GLOVE SURG SYN 8.0 PF PI (GLOVE) ×1 IMPLANT
GLOVE SURG UNDER LTX SZ7.5 (GLOVE) ×2 IMPLANT
GOWN STRL REUS W/ TWL LRG LVL3 (GOWN DISPOSABLE) ×1 IMPLANT
GOWN STRL REUS W/ TWL XL LVL3 (GOWN DISPOSABLE) ×2 IMPLANT
GOWN STRL REUS W/TWL LRG LVL3 (GOWN DISPOSABLE) ×2
GOWN STRL REUS W/TWL XL LVL3 (GOWN DISPOSABLE) ×4
HANDLE YANKAUER SUCT BULB TIP (MISCELLANEOUS) ×2 IMPLANT
KIT TURNOVER KIT A (KITS) ×2 IMPLANT
LABEL OR SOLS (LABEL) ×2 IMPLANT
MANIFOLD NEPTUNE II (INSTRUMENTS) ×2 IMPLANT
NS IRRIG 500ML POUR BTL (IV SOLUTION) ×4 IMPLANT
PACK EXTREMITY ARMC (MISCELLANEOUS) ×2 IMPLANT
PAD ABD DERMACEA PRESS 5X9 (GAUZE/BANDAGES/DRESSINGS) ×2 IMPLANT
PAD PREP 24X41 OB/GYN DISP (PERSONAL CARE ITEMS) ×2 IMPLANT
SPONGE T-LAP 18X18 ~~LOC~~+RFID (SPONGE) ×4 IMPLANT
STAPLER SKIN PROX 35W (STAPLE) IMPLANT
STOCKINETTE M/LG 89821 (MISCELLANEOUS) ×2 IMPLANT
SUT MNCRL 4-0 (SUTURE) ×2
SUT MNCRL 4-0 27XMFL (SUTURE) ×1
SUT SILK 2 0 (SUTURE) ×2
SUT SILK 2-0 18XBRD TIE 12 (SUTURE) ×1 IMPLANT
SUT SILK 3 0 (SUTURE) ×2
SUT SILK 3-0 18XBRD TIE 12 (SUTURE) ×1 IMPLANT
SUT VIC AB 0 CT1 36 (SUTURE) ×10 IMPLANT
SUT VIC AB 3-0 SH 27 (SUTURE) ×4
SUT VIC AB 3-0 SH 27X BRD (SUTURE) ×2 IMPLANT
SUT VICRYL PLUS ABS 0 54 (SUTURE) ×2 IMPLANT
SUTURE MNCRL 4-0 27XMF (SUTURE) ×1 IMPLANT
SYR 20ML LL LF (SYRINGE) ×4 IMPLANT
TAPE UMBILICAL 1/8 X36 TWILL (MISCELLANEOUS) ×2 IMPLANT
TOWEL OR 17X26 4PK STRL BLUE (TOWEL DISPOSABLE) ×4 IMPLANT
WATER STERILE IRR 500ML POUR (IV SOLUTION) ×2 IMPLANT

## 2022-03-16 NOTE — Anesthesia Preprocedure Evaluation (Addendum)
Anesthesia Evaluation  Patient identified by MRN, date of birth, ID band Patient awake    Reviewed: Allergy & Precautions, H&P , NPO status , Patient's Chart, lab work & pertinent test results, reviewed documented beta blocker date and time   History of Anesthesia Complications Negative for: history of anesthetic complications  Airway Mallampati: II  TM Distance: >3 FB Neck ROM: full    Dental  (+) Caps, Dental Advidsory Given, Teeth Intact   Pulmonary neg COPD,  Patient with suspected flash pulmonary edema after using commode; given Iv lasix. X ray from today does not show any pulmonary edema or infiltrates. Patient does not appear toxic   Pulmonary exam normal breath sounds clear to auscultation       Cardiovascular Exercise Tolerance: Good hypertension, Pt. on medications (-) angina+ CAD, + Past MI, + Cardiac Stents, + Peripheral Vascular Disease and + DVT  (-) CABG Normal cardiovascular exam(-) dysrhythmias (-) Valvular Problems/Murmurs Rhythm:regular Rate:Normal  11/2018 Echo: EF 60-65%; b. 12/2020 LV Gram: EF 45-50% in setting of NSTEMI   Neuro/Psych negative neurological ROS  negative psych ROS   GI/Hepatic Neg liver ROS, GERD  Controlled and Medicated,  Endo/Other  diabetes, Well ControlledHypothyroidism   Renal/GU CRFRenal disease  negative genitourinary   Musculoskeletal   Abdominal   Peds  Hematology negative hematology ROS (+)   Anesthesia Other Findings Past Medical History: No date: Arthritis No date: Basal cell carcinoma     Comment:  L clavicle, L prox forearm- removed years ago  No date: Bladder cancer ([place]) No date: Bladder neck contracture No date: CAD (coronary artery disease)     Comment:  a. 12/2020 NSTEMI/Cath: LM nl, LAD 95ost/p, LCX 50p, RCA               nl. EF 45-50%. No date: Cataract No date: CKD (chronic kidney disease), stage III (HCC) No date: ED (erectile dysfunction) No date:  Frequency No date: GERD (gastroesophageal reflux disease) 11/2018: History of pulmonary embolus (PE)     Comment:  a. Following LE DVT-->Chronic warfarin. No date: Hyperlipidemia LDL goal <70 No date: Hypertension No date: Hypothyroidism No date: Incontinence of urine     Comment:  sui, s/p cryoablation No date: Ischemic cardiomyopathy     Comment:  a. 11/2018 Echo: EF 60-65%; b. 12/2020 LV Gram: EF 45-50%               in setting of NSTEMI. No date: Kidney stones No date: Neuropathy     Comment:  feet No date: Nocturia No date: Prostate cancer ([place])     Comment:  S/P   CRYOABLATION No date: Right elbow tendinitis 11/25/2018: Right Lower Extremity DVT (deep venous thrombosis) (HCC) No date: Vertigo     Comment:  1-2x/yr No date: Wears glasses   Reproductive/Obstetrics negative OB ROS                            Anesthesia Physical  Anesthesia Plan  ASA: 4  Anesthesia Plan: General   Post-op Pain Management: Ofirmev IV (intra-op)*   Induction: Intravenous  PONV Risk Score and Plan: 3 and Ondansetron, Dexamethasone and Treatment may vary due to age or medical condition  Airway Management Planned: LMA  Additional Equipment: None  Intra-op Plan:   Post-operative Plan: Extubation in OR  Informed Consent: I have reviewed the patients History and Physical, chart, labs and discussed the procedure including the risks, benefits and alternatives for the proposed  anesthesia with the patient or authorized representative who has indicated his/her understanding and acceptance.     Dental Advisory Given  Plan Discussed with: Anesthesiologist, CRNA and Surgeon  Anesthesia Plan Comments: (Discussed risks of anesthesia with patient, including PONV, sore throat, lip/dental/eye damage. Rare risks discussed as well, such as cardiorespiratory and neurological sequelae, and allergic reactions. Discussed the role of CRNA in patient's perioperative care. Patient  understands. NPO since last night.)       Anesthesia Quick Evaluation

## 2022-03-16 NOTE — Anesthesia Procedure Notes (Signed)
Procedure Name: Intubation Date/Time: 03/16/2022 3:28 PM  Performed by: Cammie Sickle, CRNAPre-anesthesia Checklist: Patient identified, Patient being monitored, Timeout performed, Emergency Drugs available and Suction available Patient Re-evaluated:Patient Re-evaluated prior to induction Oxygen Delivery Method: Circle system utilized Preoxygenation: Pre-oxygenation with 100% oxygen Induction Type: IV induction and Rapid sequence Laryngoscope Size: 3 and McGraph Grade View: Grade I Tube type: Oral Tube size: 7.0 mm Number of attempts: 1 Airway Equipment and Method: Stylet Placement Confirmation: ETT inserted through vocal cords under direct vision, positive ETCO2 and breath sounds checked- equal and bilateral Secured at: 21 cm Tube secured with: Tape Dental Injury: Teeth and Oropharynx as per pre-operative assessment

## 2022-03-16 NOTE — Interval H&P Note (Signed)
History and Physical Interval Note:  03/16/2022 2:48 PM  Phillip [name]  has presented today for surgery, with the diagnosis of Left Leg Below Knee Amputation.  The various methods of treatment have been discussed with the patient and family. After consideration of risks, benefits and other options for treatment, the patient has consented to  Procedure(s): AMPUTATION BELOW KNEE (Left) as a surgical intervention.  The patient's history has been reviewed, patient examined, no change in status, stable for surgery.  I have reviewed the patient's chart and labs.  Questions were answered to the patient's satisfaction.     [name]

## 2022-03-16 NOTE — Op Note (Signed)
   OPERATIVE NOTE   PROCEDURE: Left below-the-knee amputation  PRE-OPERATIVE DIAGNOSIS: Left foot Ischemia  POST-OPERATIVE DIAGNOSIS: same as above  SURGEON: Katha Cabal, MD  ASSISTANT(S): none  ANESTHESIA: general  ESTIMATED BLOOD LOSS: 500 cc  FINDING(S): none  SPECIMEN(S):  Left below-the-knee amputation  INDICATIONS:   Phillip [name] is a 76 y.o. male who presents with left leg gangrene.  The patient is scheduled for a left below-the-knee amputation.  I discussed in depth with the patient the risks, benefits, and alternatives to this procedure.  The patient is aware that the risk of this operation included but are not limited to:  bleeding, infection, myocardial infarction, stroke, death, failure to heal amputation wound, and possible need for more proximal amputation.  The patient is aware of the risks and agrees proceed forward with the procedure.  DESCRIPTION:  After full informed written consent was obtained from the patient, the patient was brought back to the operating room, and placed supine upon the operating table.  Prior to induction, the patient received IV antibiotics.  The patient was then prepped and draped in the standard fashion for a below-the-knee amputation.  After obtaining adequate anesthesia, the patient was prepped and draped in the standard fashion for a left below-the-knee amputation.  I marked out the anterior incision two finger breadths below the tibial tuberosity and then the marked out a posterior flap that was one third of the circumference of the calf in length.   I made the incisions for these flaps, and then dissected through the subcutaneous tissue, fascia, and muscle anteriorly.  I elevated  the periosteal tissue superiorly so that the tibia was about 3-4 cm shorter than the anterior skin flap.  I then transected the tibia with a power saw and then took a wedge off the tibia anteriorly with the power saw.  Then I smoothed out the rough  edges.  In a similar fashion, I cut back the fibula about two centimeters higher than the level of the tibia with a bone cutter.  I put a bone hook into the distal tibia and then used a large amputation knife to sharply develop a tissue plane through the muscle along the fibula.  In such fashion, the posterior flap was developed.  At this point, the specimen was passed off the field as the below-the-knee amputation.  At this point, I clamped all visibly bleeding arteries and veins using a combination of suture ligation with Silk suture and electrocautery.  Bleeding continued to be controlled with electrocautery and suture ligature.  The stump was washed off with sterile normal saline and no further active bleeding was noted.  I reapproximated the anterior and posterior fascia  with interrupted stitches of 0 Vicryl.  This was completed along the entire length of anterior and posterior fascia until there were no more loose space in the fascial line. I then placed a layer of 2-0 Vicryl sutures in the subcutaneous tissue. The skin was then  reapproximated with staples.  The stump was washed off and dried.  The incision was dressed with Xeroform and  then fluffs were applied.  Kerlix was wrapped around the leg and then gently an ACE wrap was applied.    COMPLICATIONS: none  CONDITION: stable   Phillip [name]  03/16/2022, 5:36 PM    This note was created with Dragon Medical transcription system. Any errors in dictation are purely unintentional.

## 2022-03-16 NOTE — Progress Notes (Signed)
OT Cancellation Note  Patient Details Name: Phillip [name] MRN: [id number] DOB: December 02, 1945   Cancelled Treatment:    Reason Eval/Treat Not Completed: Medical issues which prohibited therapy. Per chart review, pt is scheduled for L BKA today. OT to complete orders. Please re-consult when appropriate.   Darleen Crocker, MS, OTR/L , CBIS ascom [phone]  03/16/22, 1:05 PM

## 2022-03-16 NOTE — Progress Notes (Signed)
ANTICOAGULATION CONSULT NOTE  Pharmacy Consult for aggrastat Indication:  peripheral vascular dx  Allergies  Allergen Reactions   Doxazosin Other (See Comments)    Other reaction(s): Unknown    Patient Measurements: Height: '5\' 9"'$  (175.3 cm) Weight: 79.3 kg (174 lb 13.2 oz) IBW/kg (Calculated) : 70.7 Heparin Dosing Weight: 77.1kg  Vital Signs: Temp: 98.5 F (36.9 C) (06/28 0300) Temp Source: Oral (06/28 0300) BP: 130/68 (06/28 0700) Pulse Rate: 70 (06/28 0700)  Labs: Recent Labs    03/13/22 2001 03/14/22 0341 03/14/22 0345 03/15/22 0308 03/15/22 2200 03/16/22 0413  HGB   < >  --  13.8 14.1  --  13.0  HCT  --   --  44.4 45.2  --  41.5  PLT  --   --  410* 518*  --  458*  APTT  --   --  86* 35 44*  --   HEPARINUNFRC  --   --  >1.10*  --   --   --   CREATININE  --  1.26*  --  1.16  --  1.11   < > = values in this interval not displayed.     Estimated Creatinine Clearance: 57.5 mL/min (by C-G formula based on SCr of 1.11 mg/dL).   Medical History: Past Medical History:  Diagnosis Date   Arthritis    Basal cell carcinoma    L clavicle, L prox forearm- removed years ago    Bladder cancer (HCC)    Bladder neck contracture    CAD (coronary artery disease)    a. 12/2020 NSTEMI/Cath: LM nl, LAD 95ost/p, LCX 50p, RCA nl. EF 45-50%.   Cataract    CKD (chronic kidney disease), stage III [facility])    ED (erectile dysfunction)    Frequency    GERD (gastroesophageal reflux disease)    History of pulmonary embolus (PE) 11/2018   a. Following LE DVT-->Chronic warfarin.   Hyperlipidemia LDL goal <70    Hypertension    Hypothyroidism    Incontinence of urine    sui, s/p cryoablation   Ischemic cardiomyopathy    a. 11/2018 Echo: EF 60-65%; b. 12/2020 LV Gram: EF 45-50% in setting of NSTEMI.   Kidney stones    Neuropathy    feet   Nocturia    Prostate cancer (HCC)    S/P   CRYOABLATION   Right elbow tendinitis    Right Lower Extremity DVT (deep venous thrombosis) (HCC)  11/25/2018   Vertigo    1-2x/yr   Wears glasses     Medications:  Scheduled:   Chlorhexidine Gluconate Cloth  6 each Topical Daily   docusate sodium  100 mg Oral Daily   HYDROmorphone   Intravenous Q4H   insulin aspart  0-15 Units Subcutaneous TID WC   levothyroxine  50 mcg Oral Q0600   melatonin  5 mg Oral QHS   metoprolol tartrate  12.5 mg Oral BID   pantoprazole  40 mg Oral Daily   senna-docusate  2 tablet Oral BID   sodium chloride flush  3 mL Intravenous Q12H   Infusions:   sodium chloride     sodium chloride     sodium chloride Stopped (03/13/22 0056)    ceFAZolin (ANCEF) IV     heparin 1,200 Units/hr (03/16/22 0604)   magnesium sulfate bolus IVPB    Heparin Dosing Weight: 77.1kg  Assessment: 76 y.o. 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 Patient recently having discontinued this about 1 week  ago in preparation for placement of an artificial urinary sphincter procedure on on 03/09/22 and was instructed to continue to hold Eliquis 48 hours post-op. Presents to ED on 03/11/22 with pain and duskiness in the left foot extending towards the left calf. Pharmacy has been consulted for heparin infusion for VTE.   S/P thrombectomy 6/23, additional thromboembolectomy on 6/24 PM.  Pt was on heparin gtt since 6/24 @ ~ 2000. Pt was on Eliquis 10 mg PO BID, last dose on 6/24 @ 1150. On 6/26 he was transitioned to aggrastat drip x 18 hrs with the hope for improving his forefoot perfusion, which is complete and now heparin was restarted. BKA scheduled for today at 1800   Goal of Therapy:  Heparin level 0.3-0.7 units/ml aPTT 66-102 seconds Monitor platelets by anticoagulation protocol: Yes   Plan:   aPTT and heparin level therapeutic and are correlating: continue  heparin infusion at 1200 units/hr  Check heparin level in 8 hours Daily CBC while on IV heparin  Phillip [name] 03/16/2022,7:04 AM

## 2022-03-16 NOTE — Progress Notes (Addendum)
Heparin gtt was stopped for the surgical procedure at 0936 per Dr. Delana Meyer.  At approximately 1158 pt heart rate increased to the 160s, oxygen levels decreased to 77% on room air. Upon entering the room at 1200, pt is calm and speaking with family with a HR in the 80s. Denies shortness of breath, pain, or discomfort. Pt is in no acute distress, not diaphoretic. Respiratory rate is 19, regular and unlabored. Chest expansion is symmetrical. Lung sounds showed new course crackles.  2L Nasal cannula placed on the patient with no effect. Increased oxygen to 6L and the patient's oxygen increased to 86%. 12-lead EKG obtained. Messaged Dr. Roosevelt Locks about the events who came to the bedside to assess the patient. Orders placed for chest xray and Furosemide. Pt placed on high flow nasal cannula on 8L. Dilaudid PCA paused.

## 2022-03-16 NOTE — Progress Notes (Addendum)
Progress Note   Patient: Phillip [name] DOB: October 05, 1945 DOA: 03/11/2022     5 DOS: the patient was seen and examined on 03/16/2022   Brief hospital course: Phillip [name] is a 76 y.o. male with medical history significant for recurrent DVTs, post operative PE on Eliquis, unspecified thrombophilia, hypertension, hyperlipidemia, nephrolithiasis, malignant neoplasm of prostate post resection, bladder cancer, osteoarthritis, coronary artery disease status post PCI with stenting on Plavix, chronic diastolic CHF, who presents to [facility] ED from his PCP's office, Dr. Sabra [name], due to concern for left critical lower limb ischemia.   Patient is seen by vascular surgery, had a lower extremity bypass surgery on 6/23.  Went back to the OR on 6/24 for L LE thrombectomy.  Patient also placed on heparin drip.  It appears that the patient left leg is nonsalvageable.  BKA scheduled. 6/28.  Developed episode of PSVT, hypoxia.  Appears to be flash pulm edema, given IV Lasix.  Assessment and Plan: Acute hypoxemic respiratory failure Flash pulmonary edema secondary to acute on chronic diastolic congestive heart failure. PSVT Patient developed signal tachycardia and hypoxia after to bedside commode.  Oxygen saturation dropped down to 80%, requiring 6 L oxygen.  He also had a brief episode of PSVT.  Currently in sinus rhythm. Oxygen is better after giving 6 L oxygen.  Patient currently does not feel shortness of breath. The etiology most likely is due to flash pulm edema as patient has significant crackles in the bases.  Acute PE is unlikely due to the fact that the patient was on heparin drip, which she was just held off for 1 hour. Reviewed her prior echocardiogram performed 1 year ago, ejection fraction was 50 to 54%, grade 1 diastolic dysfunction.  Left lower extremity ischemia. Failed effort with revascularization and thrombectomy. Patient was continued on heparin drip, which she is on hold since  about 1 to 2 hours ago for scheduled surgery today.  I have notified Phillip [name] about this event, he will decide if patient need to hold off surgery until tomorrow.  History of bladder cancer s/p resection. History of prostate cancer. Urinary stress incontinence.  Status post artificial urinary sphincter on 6/21. Patient doing well.   Acute kidney injury. Condition improved.  Coronary artery disease status post PCI and stenting. Continue Plavix, statin.  History of DVT and PE. Reactive thrombocytosis. Acquired hemophilia. Patient will be placed back on heparin drip if surgery is delayed.  Essential hypertension. Continue metoprolol.    Addendum: 5625 Reviewed chest x-ray, no frank pulmonary edema.  Patient condition appears to be secondary to exertion<PSVT<mild pulmonary edema> hypoxia.  Received IV Lasix.  Should be able to go to surgery today   Subjective:  Patient had an episode of tachycardia after moving himself to bedside commode, then developed hypoxia. Currently does not feel short of breath. No cough. No fever or chills.  Physical Exam: Vitals:   03/16/22 0600 03/16/22 0700 03/16/22 0850 03/16/22 1215  BP: (!) 142/93 130/68    Pulse: 74 70    Resp: '18 12 16 16  '$ Temp:      TempSrc:      SpO2: 92% 93% 95% 90%  Weight:      Height:       General exam: Appears calm and comfortable  Respiratory system: Significant crackles in the bases. Respiratory effort normal. Cardiovascular system: S1 & S2 heard, RRR. No JVD, murmurs, rubs, gallops or clicks. No pedal edema. Gastrointestinal system: Abdomen is nondistended, soft and nontender.  No organomegaly or masses felt. Normal bowel sounds heard. Central nervous system: Alert and oriented. No focal neurological deficits. Extremities: Left leg feels cool. Skin: No rashes, lesions or ulcers Psychiatry: Judgement and insight appear normal. Mood & affect appropriate.   Data Reviewed:  Lab results reviewed  Family  Communication: Wife at bedside.  Updated.  Disposition: Status is: Inpatient Remains inpatient appropriate because: Severity of disease, pending surgery.  Planned Discharge Destination: Home with Home Health    Time spent: 55 minutes  Author: Sharen Hones, MD 03/16/2022 12:29 PM  For on call review [email or web address].

## 2022-03-16 NOTE — Progress Notes (Signed)
PT Cancellation Note  Patient Details Name: Phillip [name] MRN: [id number] DOB: March 01, 1946   Cancelled Treatment:    Reason Eval/Treat Not Completed: Medical issues which prohibited therapy (Per chart review, patient scheduled for L BKA this date. Per guidelines, will require new orders after change in status.  Will complete initial order; please re-consult as medically appropriate.)   Dhiya Smits H. Owens Shark, PT, DPT, NCS 03/16/22, 10:00 AM [phone]

## 2022-03-16 NOTE — Transfer of Care (Signed)
Immediate Anesthesia Transfer of Care Note  Patient: Phillip [name]  Procedure(s) Performed: AMPUTATION BELOW KNEE (Left: Knee)  Patient Location: PACU  Anesthesia Type:General  Level of Consciousness: awake, alert  and oriented  Airway & Oxygen Therapy: Patient Spontanous Breathing and Patient connected to face mask oxygen  Post-op Assessment: Report given to RN and Post -op Vital signs reviewed and stable  Post vital signs: Reviewed and stable  Last Vitals:  Vitals Value Taken Time  BP 128/70 03/16/22 1722  Temp    Pulse 71 03/16/22 1725  Resp 10 03/16/22 1725  SpO2 96 % 03/16/22 1725  Vitals shown include unvalidated device data.  Last Pain:  Vitals:   03/16/22 1424  TempSrc: Oral  PainSc: 6       Patients Stated Pain Goal: 2 (68/59/92 3414)  Complications: No notable events documented.

## 2022-03-17 ENCOUNTER — Encounter: Payer: Self-pay | Admitting: Vascular Surgery

## 2022-03-17 ENCOUNTER — Inpatient Hospital Stay: Payer: Medicare HMO

## 2022-03-17 DIAGNOSIS — I7090 Unspecified atherosclerosis: Secondary | ICD-10-CM | POA: Diagnosis not present

## 2022-03-17 DIAGNOSIS — I5033 Acute on chronic diastolic (congestive) heart failure: Secondary | ICD-10-CM | POA: Diagnosis not present

## 2022-03-17 DIAGNOSIS — J9601 Acute respiratory failure with hypoxia: Secondary | ICD-10-CM | POA: Diagnosis not present

## 2022-03-17 LAB — BASIC METABOLIC PANEL
Anion gap: 7 (ref 5–15)
BUN: 29 mg/dL — ABNORMAL HIGH (ref 8–23)
CO2: 25 mmol/L (ref 22–32)
Calcium: 8.5 mg/dL — ABNORMAL LOW (ref 8.9–10.3)
Chloride: 104 mmol/L (ref 98–111)
Creatinine, Ser: 1.2 mg/dL (ref 0.61–1.24)
GFR, Estimated: >60 mL/min (ref >60)
Glucose, Bld: 162 mg/dL — ABNORMAL HIGH (ref 70–99)
Potassium: 4.8 mmol/L (ref 3.5–5.1)
Sodium: 136 mmol/L (ref 135–145)

## 2022-03-17 LAB — CBC
HCT: 37.2 % — ABNORMAL LOW (ref 39.0–52.0)
Hemoglobin: 11.6 g/dL — ABNORMAL LOW (ref 13.0–17.0)
MCH: 27.6 pg (ref 26.0–34.0)
MCHC: 31.2 g/dL (ref 30.0–36.0)
MCV: 88.4 fL (ref 80.0–100.0)
Platelets: 396 10*3/uL (ref 150–400)
RBC: 4.21 MIL/uL — ABNORMAL LOW (ref 4.22–5.81)
RDW: 15.9 % — ABNORMAL HIGH (ref 11.5–15.5)
WBC: 18.2 10*3/uL — ABNORMAL HIGH (ref 4.0–10.5)
nRBC: 0 % (ref 0.0–0.2)

## 2022-03-17 LAB — MAGNESIUM: Magnesium: 2 mg/dL (ref 1.7–2.4)

## 2022-03-17 LAB — BRAIN NATRIURETIC PEPTIDE: B Natriuretic Peptide: 117.3 pg/mL — ABNORMAL HIGH (ref 0.0–100.0)

## 2022-03-17 LAB — GLUCOSE, CAPILLARY
Glucose-Capillary: 162 mg/dL — ABNORMAL HIGH (ref 70–99)
Glucose-Capillary: 224 mg/dL — ABNORMAL HIGH (ref 70–99)
Glucose-Capillary: 161 mg/dL — ABNORMAL HIGH (ref 70–99)
Glucose-Capillary: 176 mg/dL — ABNORMAL HIGH (ref 70–99)

## 2022-03-17 MED ORDER — IOHEXOL 350 MG/ML SOLN
75.0000 mL | Freq: Once | INTRAVENOUS | Status: AC | PRN
  Administered 2022-03-17: 75 mL via INTRAVENOUS

## 2022-03-17 MED ORDER — DOCUSATE SODIUM 100 MG PO CAPS
100.0000 mg | ORAL_CAPSULE | Freq: Two times a day (BID) | ORAL | Status: DC
  Administered 2022-03-17 – 2022-03-21 (×6): 100 mg via ORAL
  Filled 2022-03-17 (×6): qty 1

## 2022-03-17 MED ORDER — CEFAZOLIN SODIUM-DEXTROSE 1-4 GM/50ML-% IV SOLN
1.0000 g | Freq: Three times a day (TID) | INTRAVENOUS | Status: AC
  Administered 2022-03-17 – 2022-03-18 (×2): 1 g via INTRAVENOUS
  Filled 2022-03-17 (×3): qty 50

## 2022-03-17 MED ORDER — ALBUMIN HUMAN 25 % IV SOLN
25.0000 g | Freq: Once | INTRAVENOUS | Status: AC
Start: 2022-03-17 — End: 2022-03-18
  Administered 2022-03-17: 25 g via INTRAVENOUS
  Filled 2022-03-17: qty 100

## 2022-03-17 MED ORDER — APIXABAN 5 MG PO TABS
5.0000 mg | ORAL_TABLET | Freq: Two times a day (BID) | ORAL | Status: DC
Start: 2022-03-17 — End: 2022-03-17
  Administered 2022-03-17: 5 mg via ORAL
  Filled 2022-03-17: qty 1

## 2022-03-17 MED ORDER — DIGOXIN 0.25 MG/ML IJ SOLN
0.2500 mg | Freq: Once | INTRAMUSCULAR | Status: AC
  Administered 2022-03-17: 0.25 mg via INTRAVENOUS
  Filled 2022-03-17: qty 2

## 2022-03-17 MED ORDER — CLOPIDOGREL BISULFATE 75 MG PO TABS: 75.0000 mg | ORAL_TABLET | Freq: Every day | ORAL | Status: DC

## 2022-03-17 MED ORDER — HEPARIN BOLUS VIA INFUSION
5000.0000 [IU] | Freq: Once | INTRAVENOUS | Status: AC
  Administered 2022-03-17: 5000 [IU] via INTRAVENOUS
  Filled 2022-03-17: qty 5000

## 2022-03-17 MED ORDER — LACTULOSE 10 GM/15ML PO SOLN
20.0000 g | Freq: Two times a day (BID) | ORAL | Status: AC
  Administered 2022-03-17 (×2): 20 g via ORAL
  Filled 2022-03-17 (×2): qty 30

## 2022-03-17 MED ORDER — MAGNESIUM HYDROXIDE 400 MG/5ML PO SUSP
30.0000 mL | Freq: Once | ORAL | Status: AC
Start: 2022-03-17 — End: 2022-03-17
  Administered 2022-03-17: 30 mL via ORAL
  Filled 2022-03-17: qty 30

## 2022-03-17 MED ORDER — APIXABAN 5 MG PO TABS: 5.0000 mg | ORAL_TABLET | Freq: Two times a day (BID) | ORAL | Status: DC

## 2022-03-17 MED ORDER — CLOPIDOGREL BISULFATE 75 MG PO TABS
75.0000 mg | ORAL_TABLET | Freq: Every day | ORAL | Status: DC
  Administered 2022-03-17 – 2022-03-28 (×12): 75 mg via ORAL
  Filled 2022-03-17 (×12): qty 1

## 2022-03-17 MED ORDER — HEPARIN (PORCINE) 25000 UT/250ML-% IV SOLN
1200.0000 [IU]/h | INTRAVENOUS | Status: DC
  Administered 2022-03-17: 1200 [IU]/h via INTRAVENOUS
  Filled 2022-03-17: qty 250

## 2022-03-17 NOTE — Progress Notes (Signed)
   03/17/22 0800  Clinical Encounter Type  Visited With Patient  Visit Type Initial;Spiritual support;Post-op  Spiritual Encounters  Spiritual Needs Prayer   Chaplain visited patient to provide support after BKA.

## 2022-03-17 NOTE — Evaluation (Signed)
Occupational Therapy Re-Evaluation Patient Details Name: Phillip [name] MRN: [id number] DOB: [birth year] Today's Date: 03/17/2022   History of Present Illness 76 y.o. male with medical history significant for recurrent DVTs, post operative PE on Eliquis, hypertension, hyperlipidemia, nephrolithiasis, malignant neoplasm of prostate post resection, osteoarthritis, coronary artery disease status post PCI with stenting on Plavix, chronic diastolic CHF, who presents to [facility] ED from his PCP's office, Dr. Sabra Heck, due to concern for critical left lower limb ischemia.  The patient had an artificial urinary sphincter placed on Wednesday, 03/09/2022.  His Eliquis was held a week prior to the procedure.  1 to 2 days after being off Eliquis he started having pain in his left lower extremity.  His left lower extremity pain became progressively worse, 12/10 in the last 24 hours.  Also endorses his skin turning cold and blue, involving his left toes.  Associated with inability to bear weight on his left lower extremity.  s/p L LE thrombectomy (6/23 and 6/24). 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   Clinical Impression   Pt was seen for OT Re-evaluation this date s/p L BKA. Upon entering room, pt was awake and alert, lying upright in bed with nurse present. Pt and friend reported family and friends could provide assistance at home if needed. Pt currently requires SUPERVISION with HOB elevated for supine>sit and SUPERVISION/SETUP for donning/doffing socks bed level. Anticipate SUPERVISION/SETUP for seated grooming tasks and upper body dressing. MIN A + RW with min vcs for safe hand placement for STS - pt able to tolerate ~30 seconds of standing before feeling nauseous/fatigue. Vitals taken after standing, 64/45 (MAP 52) at EOB. MAX A for sit>supine 2/2 low BP - vitals taken again after a few minutes of lying down: 63/56 (MAP 60) - suspect medication related as pt received pain meds at start of session. Pt educated on PLB, spirometer, and  positioning for L BKA. Pt verbalized understanding of instruction. Pt left in bed with all needs met. Nurse notified of mobility status and low BP during session. Goals and frequency updated, discharge recommendation downgraded to STR, may progress to CIR.     Recommendations for follow up therapy are one component of a multi-disciplinary discharge planning process, led by the attending physician.  Recommendations may be updated based on patient status, additional functional criteria and insurance authorization.   Follow Up Recommendations  Skilled nursing-short term rehab (<3 hours/day) (may progress)    Assistance Recommended at Discharge Frequent or constant Supervision/Assistance  Patient can return home with the following Two people to help with walking and/or transfers;A lot of help with bathing/dressing/bathroom;Assistance with cooking/housework;Assist for transportation;Help with stairs or ramp for entrance    Functional Status Assessment  Patient has had a recent decline in their functional status and demonstrates the ability to make significant improvements in function in a reasonable and predictable amount of time.  Equipment Recommendations  BSC/3in1    Recommendations for Other Services       Precautions / Restrictions Precautions Precautions: Fall Restrictions Weight Bearing Restrictions: No      Mobility Bed Mobility Overal bed mobility: Needs Assistance Bed Mobility: Supine to Sit     Supine to sit: Supervision, HOB elevated Sit to supine: Max assist (2/2 low BP)        Transfers Overall transfer level: Needs assistance Equipment used: Rolling walker (2 wheels) Transfers: Sit to/from Stand Sit to Stand: Min assist                  Balance  Overall balance assessment: Needs assistance Sitting-balance support: Feet supported, Single extremity supported Sitting balance-Leahy Scale: Fair     Standing balance support: Reliant on assistive device for  balance, Bilateral upper extremity supported Standing balance-Leahy Scale: Poor Standing balance comment: able to stand for ~30 seconds                           ADL either performed or assessed with clinical judgement   ADL Overall ADL's : Needs assistance/impaired                                       General ADL Comments: SUPERVISION/SETUP for donning/doffing sock on RLE bed level. Anticipate SUPERVISION/SETUP for seated grooming tasks and upper body dressing.      Pertinent Vitals/Pain Pain Assessment Pain Assessment: Faces Faces Pain Scale: Hurts little more Pain Location: L LE Pain Descriptors / Indicators: Discomfort, Grimacing, Guarding, Sore Pain Intervention(s): Limited activity within patient's tolerance, Monitored during session, RN gave pain meds during session, Repositioned     Hand Dominance     Extremity/Trunk Assessment Upper Extremity Assessment Upper Extremity Assessment: Overall WFL for tasks assessed   Lower Extremity Assessment Lower Extremity Assessment: Generalized weakness (L BKA)       Communication Communication Communication: No difficulties   Cognition Arousal/Alertness: Awake/alert Behavior During Therapy: WFL for tasks assessed/performed Overall Cognitive Status: Within Functional Limits for tasks assessed                                                  Home Living Family/patient expects to be discharged to:: Private residence Living Arrangements: Spouse/significant other Available Help at Discharge: Available 24 hours/day Type of Home: Other(Comment) (condo) Home Access: Stairs to enter Entrance Stairs-Number of Steps: 1 Entrance Stairs-Rails: Right Home Layout: Two level;Able to live on main level with bedroom/bathroom     Bathroom Shower/Tub: Hospital doctor Toilet: Handicapped height     Home Equipment: Conservation officer, nature (2 wheels);Cane - single point;Shower seat           Prior Functioning/Environment Prior Level of Function : Independent/Modified Independent             Mobility Comments: Indep with ADLs, household and community mobilization without assist device; working part-time on golf course, enjoys staying active.          OT Problem List: Decreased strength;Decreased range of motion;Decreased activity tolerance;Impaired balance (sitting and/or standing);Decreased safety awareness;Pain;Decreased knowledge of use of DME or AE      OT Treatment/Interventions: Self-care/ADL training;Therapeutic exercise;Energy conservation;DME and/or AE instruction;Therapeutic activities;Patient/family education;Balance training    OT Goals(Current goals can be found in the care plan section) Acute Rehab OT Goals Patient Stated Goal: to get stronger OT Goal Formulation: With patient Time For Goal Achievement: 03/27/22 Potential to Achieve Goals: Good ADL Goals Pt Will Perform Grooming: standing;with min guard assist Pt Will Perform Lower Body Dressing: sitting/lateral leans;with modified independence Pt Will Transfer to Toilet: stand pivot transfer;with min guard assist;bedside commode (drop arm BSC) Pt Will Perform Toileting - Clothing Manipulation and hygiene: sitting/lateral leans;with supervision  OT Frequency: Min 3X/week       AM-PAC OT "6 Clicks" Daily Activity     Outcome Measure Help from  another person eating meals?: None Help from another person taking care of personal grooming?: A Lot Help from another person toileting, which includes using toliet, bedpan, or urinal?: A Lot Help from another person bathing (including washing, rinsing, drying)?: A Lot Help from another person to put on and taking off regular upper body clothing?: A Little Help from another person to put on and taking off regular lower body clothing?: A Little 6 Click Score: 16   End of Session Equipment Utilized During Treatment: Rolling walker (2 wheels);Gait  belt Nurse Communication: Mobility status;Other (comment) (Low BP during session)  Activity Tolerance: Patient tolerated treatment well;Patient limited by fatigue Patient left: in bed;with call bell/phone within reach;with bed alarm set;with SCD's reapplied  OT Visit Diagnosis: Unsteadiness on feet (R26.81);Repeated falls (R29.6);Muscle weakness (generalized) (M62.81)                Time: 5107-1252 OT Time Calculation (min): 50 min Charges:  OT General Charges $OT Visit: 1 Visit OT Evaluation $OT Re-eval: 1 Re-eval OT Treatments $Self Care/Home Management : 38-52 mins  [employer] OTDS  [employer] 03/17/2022, 4:23 PM

## 2022-03-17 NOTE — Progress Notes (Signed)
   03/17/22 1727  Assess: MEWS Score  BP 91/68  MAP (mmHg) 77  Pulse Rate (!) 108  Level of Consciousness Responds to Voice  SpO2 92 %  O2 Device Nasal Cannula  O2 Flow Rate (L/min) 5 L/min  Assess: MEWS Score  MEWS Temp 0  MEWS Systolic 1  MEWS Pulse 1  MEWS RR 0  MEWS LOC 1  MEWS Score 3  MEWS Score Color Yellow  Assess: if the MEWS score is Yellow or Red  Were vital signs taken at a resting state? Yes  Focused Assessment Change from prior assessment (see assessment flowsheet)  Does the patient meet 2 or more of the SIRS criteria? No  MEWS guidelines implemented *See Row Information* Yes  Notify: Charge Nurse/RN  Name of Charge Nurse/RN Notified Jessi RN  Date Charge Nurse/RN Notified 03/17/22  Time Charge Nurse/RN Notified 1750  Notify: Provider  Provider Name/Title Dr. Roosevelt Locks  Date Provider Notified 03/17/22  Method of Notification Page  Notification Reason Other (Comment);Change in status (multiple problems, see nurse's note)  Provider response See new orders  Assess: SIRS CRITERIA  SIRS Temperature  0  SIRS Pulse 1  SIRS Respirations  0  SIRS WBC 1  SIRS Score Sum  2

## 2022-03-17 NOTE — Anesthesia Postprocedure Evaluation (Signed)
Anesthesia Post Note  Patient: Phillip [name]  Procedure(s) Performed: AMPUTATION BELOW KNEE (Left: Knee)  Patient location during evaluation: SICU Anesthesia Type: General Level of consciousness: awake Pain management: pain level controlled Vital Signs Assessment: post-procedure vital signs reviewed and stable Cardiovascular status: stable Postop Assessment: no apparent nausea or vomiting Anesthetic complications: no   No notable events documented.   Last Vitals:  Vitals:   03/17/22 0400 03/17/22 0500  BP: 134/75 (!) 154/82  Pulse: 69 76  Resp: 11 14  Temp: 36.8 C   SpO2: 97% 99%    Last Pain:  Vitals:   03/17/22 0400  TempSrc: Oral  PainSc:                  Lia Foyer

## 2022-03-17 NOTE — Evaluation (Signed)
Physical Therapy Evaluation Patient Details Name: Phillip [name] MRN: [id number] DOB: 12-Jul-1946 Today's Date: 03/17/2022  History of Present Illness  76 y.o. male with medical history significant for recurrent DVTs, post operative PE on Eliquis, hypertension, hyperlipidemia, nephrolithiasis, malignant neoplasm of prostate post resection, osteoarthritis, coronary artery disease status post PCI with stenting on Plavix, chronic diastolic CHF, who presents to [facility] ED from his PCP's office, Dr. Sabra Heck, due to concern for critical left lower limb ischemia.  The patient had an artificial urinary sphincter placed on Wednesday, 03/09/2022.  His Eliquis was held a week prior to the procedure.  1 to 2 days after being off Eliquis he started having pain in his left lower extremity.  His left lower extremity pain became progressively worse, 12/10 in the last 24 hours.  Also endorses his skin turning cold and blue, involving his left toes.  Associated with inability to bear weight on his left lower extremity.  s/p L LE thrombectomy (6/23 and 6/24).   Clinical Impression  Patient alert, oriented, reported 7-8/10 L leg pain. Seen as a PT re-evaluation, goals updated as needed. He was motivated throughout session to do as much as possible independently. Pt/pt review limb positioning and a few supine exercises for pt  HEP. He was able to perform supine to sit with CGA. Fair sitting balance, though pt preferred at least unilateral support. Sit <> stand with RW attempted twice; very heavy modA with RW stabilization to come up into standing. Pt unable to hop or pivot in standing this AM. He did laterally scoot to the recliner with minA.  Overall the patient demonstrated deficits (see "PT Problem List") that impede the patient's functional abilities, safety, and mobility and would benefit from skilled PT intervention. Recommendation at this time is SNF pending further progress with PT and further assessment of home  environment/assist at baseline.       Recommendations for follow up therapy are one component of a multi-disciplinary discharge planning process, led by the attending physician.  Recommendations may be updated based on patient status, additional functional criteria and insurance authorization.  Follow Up Recommendations Skilled nursing-short term rehab (<3 hours/day) Can patient physically be transported by private vehicle: Yes    Assistance Recommended at Discharge PRN  Patient can return home with the following  A little help with walking and/or transfers;A little help with bathing/dressing/bathroom    Equipment Recommendations Rolling walker (2 wheels);Wheelchair (measurements PT);Wheelchair cushion (measurements PT)  Recommendations for Other Services       Functional Status Assessment Patient has had a recent decline in their functional status and demonstrates the ability to make significant improvements in function in a reasonable and predictable amount of time.     Precautions / Restrictions Precautions Precautions: Fall Restrictions Weight Bearing Restrictions: No      Mobility  Bed Mobility Overal bed mobility: Needs Assistance Bed Mobility: Supine to Sit     Supine to sit: Supervision, HOB elevated     General bed mobility comments: cues for technique but able to complete without physical assist    Transfers Overall transfer level: Needs assistance Equipment used: Rolling walker (2 wheels) Transfers: Sit to/from Stand Sit to Stand: Mod assist          Lateral/Scoot Transfers: Min assist, Min guard General transfer comment: heavy modA to come up into standing, pt very motivated to complete without assist. minA to maintain standing for steadying. lateral scoot with cues, CGA, and minA due to fatigue    Ambulation/Gait  Stairs            Wheelchair Mobility    Modified Rankin (Stroke Patients Only)       Balance  Overall balance assessment: Needs assistance Sitting-balance support: Feet supported, Single extremity supported Sitting balance-Leahy Scale: Fair Sitting balance - Comments: pt more comfortable with BUE support   Standing balance support: Reliant on assistive device for balance Standing balance-Leahy Scale: Poor Standing balance comment: minA for steadying                             Pertinent Vitals/Pain Pain Assessment Pain Assessment: Faces Pain Score: 7  Pain Location: L LE Pain Descriptors / Indicators: Discomfort, Grimacing, Guarding, Aching, Sore Pain Intervention(s): Limited activity within patient's tolerance, Monitored during session, Repositioned, Premedicated before session    Home Living Family/patient expects to be discharged to:: Private residence Living Arrangements: Spouse/significant other Available Help at Discharge: Available 24 hours/day Type of Home: Other(Comment) Home Access: Stairs to enter Entrance Stairs-Rails: Right Entrance Stairs-Number of Steps: 1   Home Layout: Two level;Able to live on main level with bedroom/bathroom Home Equipment: Rolling Walker (2 wheels);Cane - single point;Shower seat      Prior Function Prior Level of Function : Independent/Modified Independent             Mobility Comments: Indep with ADLs, household and community mobilization without assist device; working part-time on golf course, enjoys staying active.       Hand Dominance   Dominant Hand: Right    Extremity/Trunk Assessment   Upper Extremity Assessment Upper Extremity Assessment: Overall WFL for tasks assessed    Lower Extremity Assessment Lower Extremity Assessment: Generalized weakness (s/p L BKA)    Cervical / Trunk Assessment Cervical / Trunk Assessment: Normal  Communication   Communication: No difficulties  Cognition Arousal/Alertness: Awake/alert Behavior During Therapy: WFL for tasks assessed/performed Overall Cognitive  Status: Within Functional Limits for tasks assessed                                          General Comments      Exercises     Assessment/Plan    PT Assessment Patient needs continued PT services  PT Problem List Decreased strength;Decreased range of motion;Decreased activity tolerance;Decreased balance;Decreased mobility;Decreased coordination;Decreased knowledge of use of DME;Decreased safety awareness;Decreased knowledge of precautions;Decreased skin integrity;Pain       PT Treatment Interventions DME instruction;Gait training;Stair training;Functional mobility training;Therapeutic activities;Therapeutic exercise;Balance training;Patient/family education;Neuromuscular re-education    PT Goals (Current goals can be found in the Care Plan section)  Acute Rehab PT Goals Patient Stated Goal: to get past this and continue to be active PT Goal Formulation: With patient Time For Goal Achievement: 03/31/22 Potential to Achieve Goals: Good    Frequency 7X/week     Co-evaluation               AM-PAC PT "6 Clicks" Mobility  Outcome Measure Help needed turning from your back to your side while in a flat bed without using bedrails?: A Little Help needed moving from lying on your back to sitting on the side of a flat bed without using bedrails?: A Little Help needed moving to and from a bed to a chair (including a wheelchair)?: A Little Help needed standing up from a chair using your arms (e.g., wheelchair or bedside chair)?: A  Lot Help needed to walk in hospital room?: Total Help needed climbing 3-5 steps with a railing? : Total 6 Click Score: 13    End of Session Equipment Utilized During Treatment: Gait belt;Oxygen Activity Tolerance: Patient tolerated treatment well;Patient limited by fatigue Patient left: in chair;with call bell/phone within reach;with family/visitor present Nurse Communication: Mobility status PT Visit Diagnosis: Other abnormalities  of gait and mobility (R26.89);Muscle weakness (generalized) (M62.81);Pain Pain - Right/Left: Left Pain - part of body: Leg    Time: 3532-9924 PT Time Calculation (min) (ACUTE ONLY): 45 min   Charges:   PT Evaluation $PT Re-evaluation: 1 Re-eval PT Treatments $Therapeutic Activity: 38-52 mins       Lieutenant Diego PT, DPT 11:40 AM,03/17/22

## 2022-03-17 NOTE — TOC Initial Note (Signed)
Transition of Care [facility]) - Initial/Assessment Note    Patient Details  Name: Phillip [name] MRN: [id number] Date of Birth: July 09, 1946  Transition of [facility] CM/SW Contact:    Pete Pelt, RN Phone Number: 03/17/2022, 2:37 PM  Clinical Narrative:    Patient lives at home with spouse, who is currently hospitalized as well.  Patient reports that she is his support at home.  Spouse prognosis, unknown, as she is being monitored on stroke care floor.  PT has evaluated patient, states would be a potential good candidate for [facility] at this time, however spouse is hospitalized and may be unable to provide support at home.  Physical Therapy plans to re evaluate in AM to assess spouse situation, and any change in patient's status that would change recommendation. TOC to follow.   Expected Discharge Plan:  (TBD) Barriers to Discharge: Continued Medical Work up   Patient Goals and CMS Choice        Expected Discharge Plan and Services Expected Discharge Plan:  (TBD)       Living arrangements for the past 2 months: Single Family Home                                      Prior Living Arrangements/Services Living arrangements for the past 2 months: Single Family Home Lives with:: Self, Spouse Patient language and need for interpreter reviewed:: Yes Do you feel safe going back to the place where you live?: Yes      Need for Family Participation in Patient Care: Yes (Comment)     Criminal Activity/Legal Involvement Pertinent to Current Situation/Hospitalization: No - Comment as needed  Activities of Daily Living Home Assistive Devices/Equipment: Eyeglasses ADL Screening (condition at time of admission) Patient's cognitive ability adequate to safely complete daily activities?: Yes Is the patient deaf or have difficulty hearing?: No Does the patient have difficulty seeing, even when wearing glasses/contacts?: Yes Does the patient have difficulty  concentrating, remembering, or making decisions?: No Patient able to express need for assistance with ADLs?: Yes Does the patient have difficulty dressing or bathing?: No Independently performs ADLs?: Yes (appropriate for developmental age) Does the patient have difficulty walking or climbing stairs?: No Weakness of Legs: None Weakness of Arms/Hands: None  Permission Sought/Granted Permission sought to share information with : Case Manager Permission granted to share information with : Yes, Verbal Permission Granted     Permission granted to share info w AGENCY: [facility] in patient rehabilitation, potential SNF candidates, potential home health as indicated in recommendations        Emotional Assessment Appearance:: Appears stated age Attitude/Demeanor/Rapport: Gracious Affect (typically observed): Pleasant   Alcohol / Substance Use: Not Applicable Psych Involvement: No (comment)  Admission diagnosis:  Arterial occlusion [I70.90] AKI (acute kidney injury) ([place]) [N17.9] Patient Active Problem List   Diagnosis Date Noted   Acute respiratory failure with hypoxia ([place]) 03/16/2022   Acute on chronic diastolic CHF (congestive heart failure) ([place]) 03/16/2022   PSVT (paroxysmal supraventricular tachycardia) ([place]) 03/16/2022   AKI (acute kidney injury) ([place]) 03/15/2022   Critical limb ischemia of left lower extremity with ulceration of lower leg ([place]) 03/15/2022   Arterial occlusion 03/11/2022   Elevated troponin    Unstable angina ([place]) 01/05/2021   Hyperlipidemia LDL goal <70    CKD (chronic kidney disease), stage III ([place])    Coronary artery disease    NSTEMI (non-ST  elevated myocardial infarction) ([place]) 12/23/2020   Acquired hypothyroidism 04/20/2020   Chemotherapy-induced neuropathy ([place]) 04/20/2020   Nonthrombocytopenic purpura ([place]) 12/03/2019   Hypercoagulable state ([place]) 12/10/2018   Acute deep vein thrombosis (DVT) of calf muscle vein of right lower extremity ([place])     Pulmonary emboli ([place]) 12/07/2018   GERD (gastroesophageal reflux disease) 12/07/2018   HTN (hypertension) 12/07/2018   HLD (hyperlipidemia) 12/07/2018   Prostate cancer ([place]) 12/07/2018   Bladder cancer ([place]) 12/07/2018   DM type 2 with diabetic mixed hyperlipidemia ([place]) 04/04/2018   Pain in right hand 10/04/2017   Tubular adenoma 41/32/4401   Helicobacter pylori (H. pylori) infection 09/04/2017   Medicare annual wellness visit, initial 03/31/2017   History of prostate cancer 10/27/2014   Lower urinary tract symptoms (LUTS) 03/18/2012   Nephrolithiasis 03/18/2012   PCP:  Rusty Aus, MD Pharmacy:   [place] [place] [address] Phone: [phone] Fax: [phone]  [facility] [address] [place] [address] Phone: [phone] Fax: [phone]  [id number] [address]: [phone]: [phone]    Social Determinants of Health (SDOH) Interventions    Readmission Risk Interventions     No data to display

## 2022-03-17 NOTE — Progress Notes (Signed)
[place] for heparin infusion initiation and monitoring Indication: pulmonary embolus  Allergies  Allergen Reactions   Doxazosin Other (See Comments)    Other reaction(s): Unknown    Patient Measurements: Height: '5\' 9"'$  (175.3 cm) Weight: 79.3 kg (174 lb 13.2 oz) IBW/kg (Calculated) : 70.7 Heparin Dosing Weight: 79.3 kg  Vital Signs: Temp: 97.8 F (36.6 C) (06/29 1932) Temp Source: Oral (06/29 1932) BP: 105/70 (06/29 1932) Pulse Rate: 114 (06/29 1932)  Labs: Recent Labs    03/15/22 0308 03/15/22 2200 03/16/22 0413 03/16/22 0656 03/16/22 1448 03/17/22 0604  HGB 14.1  --  13.0  --   --  11.6*  HCT 45.2  --  41.5  --   --  37.2*  PLT 518*  --  458*  --   --  396  APTT 35 44*  --  84*  --   --   HEPARINUNFRC  --   --   --  0.46  --   --   CREATININE 1.16  --  1.11  --   --  1.20  TROPONINIHS  --   --   --   --  311*  --     Estimated Creatinine Clearance: 53.2 mL/min (by C-G formula based on SCr of 1.2 mg/dL).   Medical History: Past Medical History:  Diagnosis Date   Arthritis    Basal cell carcinoma    L clavicle, L prox forearm- removed years ago    Bladder cancer (HCC)    Bladder neck contracture    CAD (coronary artery disease)    a. 12/2020 NSTEMI/Cath: LM nl, LAD 95ost/p, LCX 50p, RCA nl. EF 45-50%.   Cataract    CKD (chronic kidney disease), stage III [facility])    ED (erectile dysfunction)    Frequency    GERD (gastroesophageal reflux disease)    History of pulmonary embolus (PE) 11/2018   a. Following LE DVT-->Chronic warfarin.   Hyperlipidemia LDL goal <70    Hypertension    Hypothyroidism    Incontinence of urine    sui, s/p cryoablation   Ischemic cardiomyopathy    a. 11/2018 Echo: EF 60-65%; b. 12/2020 LV Gram: EF 45-50% in setting of NSTEMI.   Kidney stones    Neuropathy    feet   Nocturia    Prostate cancer (HCC)    S/P   CRYOABLATION   Right elbow tendinitis    Right Lower Extremity DVT (deep venous  thrombosis) (HCC) 11/25/2018   Vertigo    1-2x/yr   Wears glasses     Medications:  Scheduled:   apixaban  5 mg Oral BID   Chlorhexidine Gluconate Cloth  6 each Topical Daily   clopidogrel  75 mg Oral Daily   docusate sodium  100 mg Oral Daily   insulin aspart  0-15 Units Subcutaneous TID WC   lactulose  20 g Oral BID   levothyroxine  50 mcg Oral Q0600   melatonin  5 mg Oral QHS   metoprolol tartrate  12.5 mg Oral BID   pantoprazole  40 mg Oral Daily   senna-docusate  2 tablet Oral BID   sodium chloride flush  3 mL Intravenous Q12H    Assessment: 76 y.o. 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 Patient recently having discontinued this about 1 week ago in preparation for placement of an artificial urinary sphincter procedure on on 03/09/22 and was instructed to continue to hold Eliquis 48  hours post-op. Presents to ED on 03/11/22 with pain and duskiness in the left foot extending towards the left calf. Pharmacy has been consulted for heparin infusion for VTE.   S/P thrombectomy 6/23, additional thromboembolectomy on 6/24 PM.  Pt was on heparin gtt since 6/24 @ ~ 2000. Pt was on Eliquis 10 mg PO BID. On 6/26 he was transitioned to aggrastat drip x 18 hrs with the hope for improving his forefoot perfusion, which was completed then transitioned to apixaban with last dose administered 0904 03/17/22 now with a new PE   Goal of Therapy:  Heparin level 0.3-0.7 units/ml aPTT 66 - 102 seconds Monitor platelets by anticoagulation protocol: Yes   Plan:  Give 5000 units bolus x 1 Start heparin infusion at 1200 units/hr Check aPTT level in 8 hours and daily while on heparin Continue to monitor H&H and platelets  Dallie Piles 03/17/2022,8:15 PM

## 2022-03-17 NOTE — Progress Notes (Addendum)
Progress Note   Patient: [name] RSW:[id number] DOB: November 20, 1945 DOA: 03/11/2022     6 DOS: the patient was seen and examined on 03/17/2022   Brief hospital course: Phillip [name] is a 76 y.o. male with medical history significant for recurrent DVTs, post operative PE on Eliquis, unspecified thrombophilia, hypertension, hyperlipidemia, nephrolithiasis, malignant neoplasm of prostate post resection, bladder cancer, osteoarthritis, coronary artery disease status post PCI with stenting on Plavix, chronic diastolic CHF, who presents to [facility] ED from his PCP's office, Dr. Sabra Heck, due to concern for left critical lower limb ischemia.   Patient is seen by vascular surgery, had a lower extremity bypass surgery on 6/23.  Went back to the OR on 6/24 for L LE thrombectomy.  Patient also placed on heparin drip.  It appears that the patient left leg is nonsalvageable.  BKA scheduled. 6/28.  Developed episode of PSVT, hypoxia.  Appears to be flash pulm edema, given IV Lasix. BKA performed on 6/28.  Assessment and Plan:  Acute hypoxemic respiratory failure Flash pulmonary edema secondary to acute on chronic diastolic congestive heart failure. Paroxysmal A fib Patient condition so far had improved, he still on 7 L oxygen but with 100% oxygen saturation.  Will wean off oxygen. Patient can be transferred to [place] with telemetry. BNP only minimally elevated, lungs sound clear, no additional diuretics needed.  Elevated troponin Appear to be secondary to demand ischemia due to hypoxemia and tachycardia.  I am not concerned about non-STEMI.  EKG was reviewed yesterday, no ST elevation.   Left lower extremity ischemia. Failed effort with revascularization and thrombectomy. Patient had a successful BKA yesterday.  Doing well.  Continue pain control.  Start PT/OT.   History of bladder cancer s/p resection. History of prostate cancer. Urinary stress incontinence.  Status post artificial urinary  sphincter on 6/21. Patient doing well.    Acute kidney injury. Condition improved.  Coronary artery disease status post PCI and stenting. Continue Plavix, statin.  History of DVT and PE. Reactive thrombocytosis. Acquired hemophilia. After discussion with vascular surgery, start Plavix and Eliquis  Essential hypertension. Continue metoprolol.  Addendum:  Patient had another episode of PSVT, hypoxia and Bp is low. He was also orthostatic. Will give 25 grams of albumin. ABG. Obtain CTA to rule out PE.  Patient HR increased to 130. Reviewed EKG earlier this am, he had A fib. Due to lower BP, will give 0.25 mg digoxin.     Subjective:  Patient doing relatively well.  Still on 7 L oxygen with 100% saturation. Not short of breath or cough.   Physical Exam: Vitals:   03/17/22 0200 03/17/22 0300 03/17/22 0400 03/17/22 0500  BP: 131/74 128/75 134/75 (!) 154/82  Pulse: 75 72 69 76  Resp: '17 12 11 14  '$ Temp:   98.2 F (36.8 C)   TempSrc:   Oral   SpO2: 99% 98% 97% 99%  Weight:      Height:       General exam: Appears calm and comfortable  Respiratory system: Clear to auscultation. Respiratory effort normal. Cardiovascular system: S1 & S2 heard, RRR. No JVD, murmurs, rubs, gallops or clicks. No pedal edema. Gastrointestinal system: Abdomen is nondistended, soft and nontender. No organomegaly or masses felt. Normal bowel sounds heard. Central nervous system: Alert and oriented. No focal neurological deficits. Extremities: Left BKA Skin: No rashes, lesions or ulcers Psychiatry: Judgement and insight appear normal. Mood & affect appropriate.    Data Reviewed:  Lab results reviewed.  Family  Communication: Not able to  Disposition: Status is: Inpatient Remains inpatient appropriate because: Severity of disease, hypoxemia.  Planned Discharge Destination:  pending    Time spent: 55 minutes  Author: Sharen Hones, MD 03/17/2022 9:57 AM  For on call review  [email or web address].

## 2022-03-17 NOTE — Progress Notes (Signed)
[place], [place] checked VS at 1630 and asked this nurse to come to room. Patient was found very sleepy but arousable. VS in flowsheet, O2 was 78% on 2L nasal cannula. Increased to 5L until patient was able to maintain 91-92%. Dr. Roosevelt Locks paged and called this nurse. Albumin ordered as well as ABG and CTA. Since Dr. Roosevelt Locks was initially paged, patient has had multiple periods of SVT; one episode approx. 14 seconds long and 45 seconds long. Both times, HR has come back down to 110s. Notified by CCMD at 1753 that patient was back in SVT with HR in the 160-170s. Digoxin given x2 (see eMAR).

## 2022-03-17 NOTE — Progress Notes (Signed)
Cross Cover Data: Paged from [facility] radiology, Dr. Sheilah Mins at [phone] with CTA chest report IMPRESSION: 1. Bilateral central pulmonary emboli involving the main pulmonary arteries and extending to all lobar branches with nearly occlusive clot to the right lower lobe, left upper lobe and several segmental branches. 2. Positive for acute PE with CT evidence of right heart strain (RV/LV Ratio = 1.6) consistent with at least submassive (intermediate risk) PE. The presence of right heart strain has been associated with an increased risk of morbidity and mortality. 3. Scattered areas of ground-glass opacities throughout the lung parenchyma likely representing hypoperfusion. 4. Atelectasis versus developing pulmonary infarcts in the bilateral lower lobes dependently. 5. Mildly increased sclerotic focus within T4 vertebral body.  Patient currently stable with heart rate 116, nor shortness of breath oxygen sats 90% on 5L [place] oxygen.  He denies chest pain.  He endorses constipation with last BM last Tuesday  Action::1  Discussed with vascular surgery Dr. Lucky Cowboy who plans for thrombectomy in AM 2 Pharmacy consult for heparin drip 3 Transfer to stepdown 4 HIGH flow oxygen keep sats above 92% 5 Colace, senokot with milk of mag tonight - caution patient to avoid straining - plan for more aggressive bowel regimen post procedure tomorrow if conservative measures this evening not successful  Response: Patient verbalized understanding of plan.

## 2022-03-17 NOTE — Progress Notes (Signed)
   03/17/22 1800  Assess: MEWS Score  BP 91/73  MAP (mmHg) 80  Pulse Rate (!) 168  Level of Consciousness Alert  SpO2 92 %  O2 Device Nasal Cannula  O2 Flow Rate (L/min) 5 L/min  Assess: MEWS Score  MEWS Temp 0  MEWS Systolic 1  MEWS Pulse 3  MEWS RR 0  MEWS LOC 0  MEWS Score 4  MEWS Score Color Red  Assess: if the MEWS score is Yellow or Red  Were vital signs taken at a resting state? Yes  Focused Assessment No change from prior assessment  Does the patient meet 2 or more of the SIRS criteria? No  MEWS guidelines implemented *See Row Information* Yes  Take Vital Signs  Increase Vital Sign Frequency  Red: Q 1hr X 4 then Q 4hr X 4, if remains red, continue Q 4hrs  Escalate  MEWS: Escalate Red: discuss with charge nurse/RN and provider, consider discussing with RRT  Notify: Charge Nurse/RN  Name of Charge Nurse/RN Notified Jessi RN  Date Charge Nurse/RN Notified 03/17/22  Time Charge Nurse/RN Notified 1810  Notify: Provider  Provider Name/Title Dr. Roosevelt Locks  Date Provider Notified 03/17/22  Method of Notification Page  Notification Reason Other (Comment) (increased HR)  Notify: Rapid Response  Name of Rapid Response RN Notified Vaughan Sine RN  Date Rapid Response Notified 03/17/22  Time Rapid Response Notified 6213  Assess: SIRS CRITERIA  SIRS Temperature  0  SIRS Pulse 1  SIRS Respirations  0  SIRS WBC 1  SIRS Score Sum  2

## 2022-03-18 ENCOUNTER — Inpatient Hospital Stay
Admit: 2022-03-18 | Discharge: 2022-03-18 | Disposition: A | Discharge status: Home / Self Care | Payer: Medicare HMO | Attending: Internal Medicine | Admitting: Internal Medicine

## 2022-03-18 ENCOUNTER — Encounter
Admission: EM | Disposition: A | Discharge status: Skilled Nursing Facility | Payer: Self-pay | Source: Ambulatory Visit | Attending: Internal Medicine

## 2022-03-18 DIAGNOSIS — I2609 Other pulmonary embolism with acute cor pulmonale: Secondary | ICD-10-CM | POA: Diagnosis not present

## 2022-03-18 DIAGNOSIS — I7090 Unspecified atherosclerosis: Secondary | ICD-10-CM | POA: Diagnosis not present

## 2022-03-18 DIAGNOSIS — I2699 Other pulmonary embolism without acute cor pulmonale: Secondary | ICD-10-CM | POA: Diagnosis not present

## 2022-03-18 DIAGNOSIS — I5033 Acute on chronic diastolic (congestive) heart failure: Secondary | ICD-10-CM | POA: Diagnosis not present

## 2022-03-18 DIAGNOSIS — J9601 Acute respiratory failure with hypoxia: Secondary | ICD-10-CM | POA: Diagnosis not present

## 2022-03-18 HISTORY — PX: PULMONARY THROMBECTOMY: CATH118295

## 2022-03-18 LAB — ECHOCARDIOGRAM COMPLETE
Height: 69 in
S' Lateral: 2.6 cm
Weight: 2578.5 oz

## 2022-03-18 LAB — BASIC METABOLIC PANEL
Anion gap: 9 (ref 5–15)
BUN: 34 mg/dL — ABNORMAL HIGH (ref 8–23)
CO2: 24 mmol/L (ref 22–32)
Calcium: 8.5 mg/dL — ABNORMAL LOW (ref 8.9–10.3)
Chloride: 102 mmol/L (ref 98–111)
Creatinine, Ser: 1.57 mg/dL — ABNORMAL HIGH (ref 0.61–1.24)
GFR, Estimated: 46 mL/min — ABNORMAL LOW (ref >60)
Glucose, Bld: 183 mg/dL — ABNORMAL HIGH (ref 70–99)
Potassium: 4.7 mmol/L (ref 3.5–5.1)
Sodium: 135 mmol/L (ref 135–145)

## 2022-03-18 LAB — GLUCOSE, CAPILLARY
Glucose-Capillary: 160 mg/dL — ABNORMAL HIGH (ref 70–99)
Glucose-Capillary: 166 mg/dL — ABNORMAL HIGH (ref 70–99)
Glucose-Capillary: 139 mg/dL — ABNORMAL HIGH (ref 70–99)
Glucose-Capillary: 119 mg/dL — ABNORMAL HIGH (ref 70–99)

## 2022-03-18 LAB — CBC
HCT: 34.4 % — ABNORMAL LOW (ref 39.0–52.0)
Hemoglobin: 10.5 g/dL — ABNORMAL LOW (ref 13.0–17.0)
MCH: 27.1 pg (ref 26.0–34.0)
MCHC: 30.5 g/dL (ref 30.0–36.0)
MCV: 88.9 fL (ref 80.0–100.0)
Platelets: 335 10*3/uL (ref 150–400)
RBC: 3.87 MIL/uL — ABNORMAL LOW (ref 4.22–5.81)
RDW: 16.1 % — ABNORMAL HIGH (ref 11.5–15.5)
WBC: 17.9 10*3/uL — ABNORMAL HIGH (ref 4.0–10.5)
nRBC: 0 % (ref 0.0–0.2)

## 2022-03-18 LAB — MAGNESIUM: Magnesium: 2.2 mg/dL (ref 1.7–2.4)

## 2022-03-18 LAB — TROPONIN I (HIGH SENSITIVITY)
Troponin I (High Sensitivity): 2277 ng/L (ref <18)
Troponin I (High Sensitivity): 1819 ng/L (ref <18)

## 2022-03-18 LAB — SURGICAL PATHOLOGY

## 2022-03-18 LAB — APTT
aPTT: 149 seconds — ABNORMAL HIGH (ref 24–36)
aPTT: 99 seconds — ABNORMAL HIGH (ref 24–36)

## 2022-03-18 SURGERY — PULMONARY THROMBECTOMY
Anesthesia: Moderate Sedation

## 2022-03-18 MED ORDER — FAMOTIDINE 20 MG PO TABS: 40.0000 mg | ORAL_TABLET | Freq: Once | ORAL | Status: DC | PRN

## 2022-03-18 MED ORDER — FENTANYL CITRATE PF 50 MCG/ML IJ SOSY
PREFILLED_SYRINGE | INTRAMUSCULAR | Status: AC
  Filled 2022-03-18: qty 2

## 2022-03-18 MED ORDER — MIDAZOLAM HCL 2 MG/2ML IJ SOLN
INTRAMUSCULAR | Status: AC
  Filled 2022-03-18: qty 4

## 2022-03-18 MED ORDER — MIDAZOLAM HCL 2 MG/2ML IJ SOLN
INTRAMUSCULAR | Status: DC | PRN
  Administered 2022-03-18 (×2): .5 mg via INTRAVENOUS
  Administered 2022-03-18: 1 mg via INTRAVENOUS

## 2022-03-18 MED ORDER — ALTEPLASE 2 MG IJ SOLR
INTRAMUSCULAR | Status: AC
  Filled 2022-03-18: qty 12

## 2022-03-18 MED ORDER — ONDANSETRON HCL 4 MG/2ML IJ SOLN
4.0000 mg | Freq: Four times a day (QID) | INTRAMUSCULAR | Status: DC | PRN
  Administered 2022-03-26: 4 mg via INTRAVENOUS
  Filled 2022-03-18: qty 2

## 2022-03-18 MED ORDER — SODIUM CHLORIDE 0.9 % IV SOLN
INTRAVENOUS | Status: DC
Start: 2022-03-18 — End: 2022-03-18

## 2022-03-18 MED ORDER — DIPHENHYDRAMINE HCL 50 MG/ML IJ SOLN
INTRAMUSCULAR | Status: AC
  Filled 2022-03-18: qty 1

## 2022-03-18 MED ORDER — DIPHENHYDRAMINE HCL 50 MG/ML IJ SOLN
INTRAMUSCULAR | Status: DC | PRN
  Administered 2022-03-18: 50 mg via INTRAVENOUS

## 2022-03-18 MED ORDER — HYDROMORPHONE HCL 1 MG/ML IJ SOLN: 1.0000 mg | Freq: Once | INTRAMUSCULAR | Status: DC | PRN

## 2022-03-18 MED ORDER — ALTEPLASE 1 MG/ML SYRINGE FOR VASCULAR PROCEDURE
INTRAMUSCULAR | Status: DC | PRN
  Administered 2022-03-18: 12 mg via INTRA_ARTERIAL

## 2022-03-18 MED ORDER — METHYLPREDNISOLONE SODIUM SUCC 125 MG IJ SOLR: 125.0000 mg | Freq: Once | INTRAMUSCULAR | Status: DC | PRN

## 2022-03-18 MED ORDER — PRAVASTATIN SODIUM 20 MG PO TABS
40.0000 mg | ORAL_TABLET | Freq: Every day | ORAL | Status: DC
  Administered 2022-03-19 – 2022-03-28 (×10): 40 mg via ORAL
  Filled 2022-03-18 (×10): qty 2

## 2022-03-18 MED ORDER — CEFAZOLIN SODIUM-DEXTROSE 2-4 GM/100ML-% IV SOLN
2.0000 g | INTRAVENOUS | Status: AC
  Administered 2022-03-18: 2 g via INTRAVENOUS
  Filled 2022-03-18: qty 100

## 2022-03-18 MED ORDER — HEPARIN SODIUM (PORCINE) 1000 UNIT/ML IJ SOLN
INTRAMUSCULAR | Status: AC
  Filled 2022-03-18: qty 10

## 2022-03-18 MED ORDER — LACTULOSE 10 GM/15ML PO SOLN
20.0000 g | Freq: Two times a day (BID) | ORAL | Status: AC
  Administered 2022-03-18 – 2022-03-19 (×2): 20 g via ORAL
  Filled 2022-03-18 (×2): qty 30

## 2022-03-18 MED ORDER — FENTANYL CITRATE (PF) 100 MCG/2ML IJ SOLN
INTRAMUSCULAR | Status: DC | PRN
  Administered 2022-03-18: 50 ug via INTRAVENOUS

## 2022-03-18 MED ORDER — MIDAZOLAM HCL 2 MG/ML PO SYRP: 8.0000 mg | ORAL_SOLUTION | Freq: Once | ORAL | Status: DC | PRN

## 2022-03-18 MED ORDER — DIPHENHYDRAMINE HCL 50 MG/ML IJ SOLN: 50.0000 mg | Freq: Once | INTRAMUSCULAR | Status: DC | PRN

## 2022-03-18 MED ORDER — HEPARIN (PORCINE) 25000 UT/250ML-% IV SOLN
1350.0000 [IU]/h | INTRAVENOUS | Status: DC
  Administered 2022-03-18 – 2022-03-20 (×3): 1000 [IU]/h via INTRAVENOUS
  Administered 2022-03-20: 1100 [IU]/h via INTRAVENOUS
  Administered 2022-03-22: 1350 [IU]/h via INTRAVENOUS
  Administered 2022-03-22: 1200 [IU]/h via INTRAVENOUS
  Administered 2022-03-23: 1350 [IU]/h via INTRAVENOUS
  Filled 2022-03-18 (×6): qty 250

## 2022-03-18 SURGICAL SUPPLY — 15 items
CANISTER PENUMBRA ENGINE (MISCELLANEOUS) ×1 IMPLANT
CATH ANGIO 5F PIGTAIL 100CM (CATHETERS) ×1 IMPLANT
CATH INDIGO SEP 12 (CATHETERS) ×1 IMPLANT
CATH INFINITI JR4 5F (CATHETERS) ×1 IMPLANT
CATH LIGHTNI FLASH 16XTORQ 100 (CATHETERS) IMPLANT
CATH LIGHTNING FLASH XTORQ 100 (CATHETERS) ×2
CATH SELECT BERN TIP 5F 130 (CATHETERS) ×1 IMPLANT
COVER PROBE U/S 5X48 (MISCELLANEOUS) ×1 IMPLANT
DRYSEAL FLEXSHEATH 16FR 33CM (SHEATH) ×1
GLIDEWIRE ADV .035X260CM (WIRE) ×1 IMPLANT
PACK ANGIOGRAPHY (CUSTOM PROCEDURE TRAY) ×2 IMPLANT
SHEATH DRYSEAL FLEX 16FR 33CM (SHEATH) IMPLANT
SHEATH PINNACLE 11FRX10 (SHEATH) ×1 IMPLANT
TUBING CONTRAST HIGH PRESS 72 (TUBING) ×2 IMPLANT
WIRE GUIDERIGHT .035X150 (WIRE) ×1 IMPLANT

## 2022-03-18 NOTE — Progress Notes (Signed)
Tap water administered per orders.

## 2022-03-18 NOTE — Progress Notes (Signed)
OT Cancellation Note  Patient Details Name: Phillip [name] MRN: [id number] DOB: 12-16-1945   Cancelled Treatment:    Reason Eval/Treat Not Completed: Patient not medically ready. Per chart review pt transferred to ICU and workup revealed acute PE. OT to sign off due to a transition to higher level of care and change in medical status. Please re-consult when patient is medically appropriate. (Per chart review pt may undergo thrombectomy.)  Dessie Coma, M.S. OTR/L  03/18/22, 8:48 AM  ascom [phone]

## 2022-03-18 NOTE — Consult Note (Signed)
Phillip [name] NOTE       Patient ID: Phillip [name] [name] MRN: [id number] DOB/AGE: [birth year] 76 y.o.  Admit date: 03/11/2022 Referring Physician Dr. Sharen Hones Primary Physician Dr. Emily Filbert Primary Cardiologist Dr. Clayborn Bigness Reason for Consultation tachycardia   HPI: Phillip [name] [name] is a 76yoM with a PMH of CAD-NSTEMI s/p PCI with DES to proximal LAD with noted CTO RCA with collaterals 12/2020, HFpEF (LVEF 50-55% history of recurrent DVTs, and postoperative currently on Eliquis, hypertension, hyperlipidemia, type 2 diabetes, history of prostate cancer s/p resection, history of bladder cancer who presented to [facility] ED from Dr. Ammie Ferrier office on 6/23 after concern of critical left lower limb ischemia.  Notably, his Eliquis was held on for 1 week in anticipation of an artificial urinary sphincter placement performed on 6/21.  He was seen by vascular surgery and had lower extremity bypass on 6/23 and went back to the OR on 6/24 for left lower extremity thrombectomy and was placed on a heparin drip.  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  Post procedurally, he developed tachycardia and hypoxia with a new 6L odygen requirement thought to be secondary to flash pulmonary edema, improved somewhat with IV Lasix.  CTA chest was performed the evening of 6/29 which revealed bilateral pulmonary emboli with nearly occlusive clot to the right lower lobe, left upper lobe, and several segmental branches with CT evidence of right heart strain. Mechanical thrombectomy pending 6/30. Cardiology is consulted for further assistance with his tachycardia.  The patient presents with his son and close friend at bedside.  He initially presented to Dr. Ammie Ferrier office on 6/23 with left leg pain and inability to bear weight, and subsequently was evaluated in the ED and has undergone multiple procedures as noted above.  On admission he was not given his home  metoprolol tartrate 12.5 mg twice daily for the first 2-1/2 days of his hospitalization, and EKG performed on 6/25 showed sinus tachycardia with rate of 142 and right atrial enlargement.  He was given his home metoprolol that evening and his heart rate was within normal limits thereafter per chart review.  The evening of 6/29 after his left BKA he developed hypoxia and was tachycardic up to the 180s and CTA showed bilateral pulmonary emboli.  There was a question of whether this was atrial fibrillation or SVT.  Per review of telemetry strips, it appears to be SVT as the rhythm is very regular initiates with a sinus beat.  Yesterday afternoon, he was hypotensive and tachycardic and was given 2 doses of digoxin 0.25 mg, in addition to his normal metoprolol 12.5 mg twice daily and telemetry review from when he was transferred to stepdown shows sinus rhythm to sinus tachycardia with rates peaking for a short time in the 110s, otherwise has been mostly in sinus rhythm in the 90s overnight into this morning.  At my time of evaluation the patient is laying in incline in bed and appears comfortable.  He is on oxygen by nasal cannula.  He currently says he feels pretty good and much better after having a bowel movement earlier this morning (the first time in 10 days per his report).  Notes some intermittent palpitations during his hospital stay so far that occur in short bursts.  He says he felt slightly short of breath while working with occupational therapy yesterday.  He denies any chest pain during this hospitalization and thinks his breathing is okay right now.  He is scheduled for mechanical thrombectomy with vascular surgery later this morning.  Labs are notable for current renal function with a creatinine of 1.59, GFR 46.  Potassium 4.7, magnesium 2.2.  BNP slightly elevated at 117, high-sensitivity troponin was 311 on 6/28 and 2300 earlier this morning with repeats pending.  His leukocytosis to 18, and Hgb is  downtrending over the past 2 days from 13-10.5.  Chest x-ray on 6/28 showed linear opacities within the left mid to lower lung field favoring atelectasis.  CTA chest showed bilateral central pulmonary emboli involving the main pulmonary arteries and extending to all lobar branches with nearly occlusive clot to the right lower lobe, left upper lobe and several segmental branches with evidence by CT of right heart strain consistent with at least submassive PE, atelectasis versus developing pulmonary infarcts in bilateral lower lobes dependently with scattered areas of groundglass opacities throughout the lung parenchyma likely representing hypoperfusion.  Review of systems complete and found to be negative unless listed above     Past Medical History:  Diagnosis Date   Arthritis    Basal cell carcinoma    L clavicle, L prox forearm- removed years ago    Bladder cancer (HCC)    Bladder neck contracture    CAD (coronary artery disease)    a. 12/2020 NSTEMI/Cath: LM nl, LAD 95ost/p, LCX 50p, RCA nl. EF 45-50%.   Cataract    CKD (chronic kidney disease), stage III [facility])    ED (erectile dysfunction)    Frequency    GERD (gastroesophageal reflux disease)    History of pulmonary embolus (PE) 11/2018   a. Following LE DVT-->Chronic warfarin.   Hyperlipidemia LDL goal <70    Hypertension    Hypothyroidism    Incontinence of urine    sui, s/p cryoablation   Ischemic cardiomyopathy    a. 11/2018 Echo: EF 60-65%; b. 12/2020 LV Gram: EF 45-50% in setting of NSTEMI.   Kidney stones    Neuropathy    feet   Nocturia    Prostate cancer ([place])    S/P   CRYOABLATION   Right elbow tendinitis    Right Lower Extremity DVT (deep venous thrombosis) ([place]) 11/25/2018   Vertigo    1-2x/yr   Wears glasses     Past Surgical History:  Procedure Laterality Date   AMPUTATION Left 03/16/2022   Procedure: AMPUTATION BELOW KNEE;  Surgeon: Katha Cabal, MD;  Location: [facility] ORS;  Service: Vascular;   Laterality: Left;   ARTHROPLASTY AND TENDON REPAIR LEFT THUMB  JAN 2014   CATARACT EXTRACTION W/PHACO Left 11/16/2020   Procedure: CATARACT EXTRACTION PHACO AND INTRAOCULAR LENS PLACEMENT (IOC) LEFT  7.09 00:56.4 12.6%;  Surgeon: Leandrew Koyanagi, MD;  Location: [place];  Service: Ophthalmology;  Laterality: Left;   CATARACT EXTRACTION W/PHACO Right 12/02/2020   Procedure: CATARACT EXTRACTION PHACO AND INTRAOCULAR LENS PLACEMENT (IOC) RIGHT MALYUGIN 5.52 00:49.7 11.1%;  Surgeon: Leandrew Koyanagi, MD;  Location: [place];  Service: Ophthalmology;  Laterality: Right;   COLONOSCOPY     COLONOSCOPY WITH PROPOFOL N/A 08/09/2017   Procedure: COLONOSCOPY WITH PROPOFOL;  Surgeon: Manya Silvas, MD;  Location: [facility];  Service: Endoscopy;  Laterality: N/A;   CORONARY ANGIOGRAPHY N/A 12/28/2020   Procedure: CORONARY ANGIOGRAPHY;  Surgeon: Sherren Mocha, MD;  Location: [place] CV LAB;  Service: Cardiovascular;  Laterality: N/A;   CORONARY STENT INTERVENTION N/A 12/28/2020   Procedure: CORONARY STENT INTERVENTION;  Surgeon: Sherren Mocha, MD;  Location: [place] CV  LAB;  Service: Cardiovascular;  Laterality: N/A;   ESOPHAGOGASTRODUODENOSCOPY (EGD) WITH PROPOFOL N/A 08/09/2017   Procedure: ESOPHAGOGASTRODUODENOSCOPY (EGD) WITH PROPOFOL;  Surgeon: Manya Silvas, MD;  Location: [facility] ENDOSCOPY;  Service: Endoscopy;  Laterality: N/A;   GREEN LIGHT LASER TURP (TRANSURETHRAL RESECTION OF PROSTATE N/A 12/14/2015   Procedure: GREEN LIGHT LASER TURP (TRANSURETHRAL RESECTION OF PROSTATE) LASER OF BLADDER NECK CONTRACTURE;  Surgeon: Carolan Clines, MD;  Location: [place];  Service: Urology;  Laterality: N/A;   LEFT HEART CATH AND CORONARY ANGIOGRAPHY N/A 12/24/2020   Procedure: LEFT HEART CATH AND CORONARY ANGIOGRAPHY and possible PCI and stent;  Surgeon: Yolonda Kida, MD;  Location: [place] CV LAB;  Service: Cardiovascular;  Laterality:  N/A;   LEFT HEART CATH AND CORONARY ANGIOGRAPHY N/A 01/06/2021   Procedure: LEFT HEART CATH AND CORONARY ANGIOGRAPHY;  Surgeon: Troy Sine, MD;  Location: [place] CV LAB;  Service: Cardiovascular;  Laterality: N/A;   LOWER EXTREMITY ANGIOGRAPHY Left 03/11/2022   Procedure: Lower Extremity Angiography;  Surgeon: Katha Cabal, MD;  Location: [place] CV LAB;  Service: Cardiovascular;  Laterality: Left;   PENILE PROSTHESIS IMPLANT N/A 12/06/2013   Procedure: PENILE PROTHESIS INFLATABLE;  Surgeon: Ailene Rud, MD;  Location: [facility];  Service: Urology;  Laterality: N/A;   PROSTATE CRYOABLATION  06-01-2012  [place]   THROMBECTOMY ILIAC ARTERY Left 03/12/2022   Procedure: THROMBECTOMY LEG;  Surgeon: Conrad Concord, MD;  Location: [facility] ORS;  Service: Vascular;  Laterality: Left;   TRANSURETHRAL RESECTION OF BLADDER NECK N/A 03/20/2015   Procedure: RELEASE BLADDER NECK CONTRACTURE  WITH WOLF BUTTON ELECTRODE ;  Surgeon: Carolan Clines, MD;  Location: [place];  Service: Urology;  Laterality: N/A;   TRANSURETHRAL RESECTION OF BLADDER TUMOR N/A 12/05/2018   Procedure: TRANSURETHRAL RESECTION OF BLADDER TUMOR (TURBT)/CYSTOSCOPY/  INSTILLATION OF Rodell Perna;  Surgeon: Ceasar Mons, MD;  Location: [facility];  Service: Urology;  Laterality: N/A;   TRANSURETHRAL RESECTION OF BLADDER TUMOR N/A 01/15/2020   Procedure: TRANSURETHRAL RESECTION OF BLADDER TUMOR (TURBT)/ CYSTOSCOPY;  Surgeon: Ceasar Mons, MD;  Location: [facility];  Service: Urology;  Laterality: N/A;   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      Medications Prior to Admission  Medication Sig Dispense Refill Last Dose   amLODipine (NORVASC) 5 MG tablet Take 5 mg by mouth daily.   03/11/2022   ascorbic acid (VITAMIN C) 500 MG tablet Take 500 mg by mouth daily.   03/11/2022 at 0730   cetirizine (ZYRTEC) 10 MG  tablet Take 10 mg by mouth daily.   03/11/2022 at 0730   clopidogrel (PLAVIX) 75 MG tablet Take 1 tablet (75 mg total) by mouth daily with breakfast. 90 tablet 3 Past Week at 0730   ELIQUIS 5 MG TABS tablet Take 5 mg by mouth 2 (two) times daily.   Past Week at 0730   Glucosamine-Chondroit-Vit C-Mn (GLUCOSAMINE CHONDR 1500 COMPLX PO) Take 1 tablet by mouth every evening.   03/11/2022 at 0730   levothyroxine (SYNTHROID) 50 MCG tablet Take 50 mcg by mouth.   03/11/2022 at 0630   methocarbamol (ROBAXIN) 500 MG tablet Take 1 tablet (500 mg total) by mouth 4 (four) times daily. 30 tablet 0 Past Week at prn   metoprolol tartrate (LOPRESSOR) 25 MG tablet Take 0.5 tablets (12.5 mg total) by mouth 2 (two) times daily. 60 tablet 0 03/11/2022 at 0730   Multiple  Vitamin (MULTIVITAMIN) tablet Take 1 tablet by mouth daily.   03/11/2022 at 0730   nitroGLYCERIN (NITROSTAT) 0.4 MG SL tablet Place 1 tablet (0.4 mg total) under the tongue every 5 (five) minutes x 3 doses as needed for chest pain. 25 tablet 3 Past Month at prn   pantoprazole (PROTONIX) 40 MG tablet Take 1 tablet (40 mg total) by mouth daily. 90 tablet 3 03/11/2022 at 0730   potassium phosphate, monobasic, (K-PHOS ORIGINAL) 500 MG tablet Take 500 mg by mouth daily.   03/11/2022 at 0730   pravastatin (PRAVACHOL) 40 MG tablet Take 40 mg by mouth daily.   03/10/2022 at 1930   sulfamethoxazole-trimethoprim (BACTRIM DS) 800-160 MG tablet Take 1 tablet by mouth 2 (two) times daily.   03/11/2022 at 0730   traMADol (ULTRAM) 50 MG tablet Take 50 mg by mouth every 6 (six) hours as needed for mild pain.   03/11/2022 at 0800   TURMERIC PO Take 1 tablet by mouth daily.   03/11/2022 at 0730   acetaminophen (TYLENOL) 500 MG tablet Take 500 mg by mouth every 6 (six) hours as needed.   prn at prn   aspirin 81 MG EC tablet Take 1 tablet (81 mg total) by mouth daily. Swallow whole. (Patient not taking: Reported on 03/11/2022) 30 tablet 0 Not Taking   atorvastatin (LIPITOR) 80 MG  tablet Take 1 tablet (80 mg total) by mouth daily. (Patient not taking: Reported on 03/11/2022) 90 tablet 3 Not Taking   cholecalciferol (VITAMIN D3) 25 MCG (1000 UT) tablet Take 1,000 Units by mouth daily. (Patient not taking: Reported on 01/18/2021)      Chromium 1 MG CAPS Take 1 mg by mouth daily. 1 per day (Patient not taking: Reported on 03/11/2022)   Not Taking   isosorbide mononitrate (IMDUR) 30 MG 24 hr tablet Take 1 tablet (30 mg total) by mouth daily. (Patient not taking: Reported on 03/11/2022) 90 tablet 3 Not Taking   loratadine (CLARITIN) 10 MG tablet Take 10 mg by mouth in the morning and at bedtime. (Patient not taking: Reported on 03/11/2022)   Not Taking   niacin 500 MG tablet Take 500 mg by mouth at bedtime. (Patient not taking: Reported on 01/18/2021)      predniSONE (DELTASONE) 10 MG tablet Take 1 tablet (10 mg total) by mouth as directed. 42 tablet 0    testosterone cypionate (DEPOTESTOSTERONE CYPIONATE) 200 MG/ML injection Inject 200 mg into the muscle every 14 (fourteen) days.   02/25/2022   triamcinolone cream (KENALOG) 0.1 % Apply topically 2 (two) times daily.   prn at prn   Social History   Socioeconomic History   Marital status: Married    Spouse name: Teague Goynes   Number of children: Not on file   Years of education: Not on file   Highest education level: Not on file  Occupational History   Not on file  Tobacco Use   Smoking status: Never   Smokeless tobacco: Never  Vaping Use   Vaping Use: Never used  Substance and Sexual Activity   Alcohol use: No   Drug use: No   Sexual activity: Not Currently    Birth control/protection: None  Other Topics Concern   Not on file  Social History Narrative   Lives locally with wife.  Exercises regularly - gym/golf.  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   Social Determinants of Health   Financial Resource Strain: Not on file  Food Insecurity:  Not on file  Transportation Needs: Not on file   Physical Activity: Not on file  Stress: Not on file  Social Connections: Not on file  Intimate Partner Violence: Not on file    Family History  Problem Relation Age of Onset   CAD Father 64      PHYSICAL EXAM General: Elderly Caucasian male, well nourished, in no acute distress.  Sitting upright in ICU bed with his son and friend at bedside HEENT:  Normocephalic and atraumatic. Neck:  No JVD.  Lungs: Normal respiratory effort on oxygen by nasal cannula. Clear bilaterally to auscultation. No wheezes, crackles, rhonchi.  Heart: HRRR . Normal S1 and S2 without gallops or murmurs. Radial & DP pulses 2+ bilaterally. Abdomen: Non-distended appearing.  Msk: Normal strength and tone for age. Extremities: S/p left BKA, stump covered with dressing, RLE without obvious edema. Neuro: Alert and oriented X 3. Psych:  Answers questions appropriately.   Labs:   Lab Results  Component Value Date   WBC 17.9 (H) 03/18/2022   HGB 10.5 (L) 03/18/2022   HCT 34.4 (L) 03/18/2022   MCV 88.9 03/18/2022   PLT 335 03/18/2022    Recent Labs  Lab 03/13/22 0114 03/14/22 0341 03/18/22 0519  NA 134*   < > 135  K 4.4   < > 4.7  CL 105   < > 102  CO2 23   < > 24  BUN 21   < > 34*  CREATININE 1.24   < > 1.57*  CALCIUM 8.7*   < > 8.5*  PROT 5.9*  --   --   BILITOT 0.7  --   --   ALKPHOS 43  --   --   ALT 19  --   --   AST 23  --   --   GLUCOSE 171*   < > 183*   < > = values in this interval not displayed.   Lab Results  Component Value Date   TROPONINI <0.03 03/09/2019    Lab Results  Component Value Date   CHOL 174 12/24/2020   Lab Results  Component Value Date   HDL 32 (L) 12/24/2020   Lab Results  Component Value Date   LDLCALC 119 (H) 12/24/2020   Lab Results  Component Value Date   TRIG 113 12/24/2020   Lab Results  Component Value Date   CHOLHDL 5.4 12/24/2020   No results found for: "LDLDIRECT"    Radiology: CT Angio Chest Pulmonary Embolism (PE) W or WO  Contrast  Addendum Date: 03/17/2022   ADDENDUM REPORT: 03/17/2022 20:06 ADDENDUM: These results were called by telephone at the time of interpretation on 03/17/2022 at 8:06 pm to provider NP BRENDA MORRRISON, who verbally acknowledged these results. Electronically Signed   By: Fidela Salisbury M.D.   On: 03/17/2022 20:06   Result Date: 03/17/2022 CLINICAL DATA:  Shortness of breath. EXAM: CT ANGIOGRAPHY CHEST WITH CONTRAST TECHNIQUE: Multidetector CT imaging of the chest was performed using the standard protocol during bolus administration of intravenous contrast. Multiplanar CT image reconstructions and MIPs were obtained to evaluate the vascular anatomy. RADIATION DOSE REDUCTION: This exam was performed according to the departmental dose-optimization program which includes automated exposure control, adjustment of the mA and/or kV according to patient size and/or use of iterative reconstruction technique. CONTRAST:  16m OMNIPAQUE IOHEXOL 350 MG/ML SOLN COMPARISON:  Feb 11, 2022 FINDINGS: Cardiovascular: Satisfactory opacification of the pulmonary arteries to the segmental level. Bilateral central pulmonary emboli involving the main  pulmonary arteries and extending to wall lobar branches with nearly occlusive clot to the right lower lobe, left upper lobe and several segmental branches. RV/LV ratio of 1.6, significant for right heart strain. Mediastinum/Nodes: No enlarged mediastinal, hilar, or axillary lymph nodes. Thyroid gland, trachea, and esophagus demonstrate no significant findings. Lungs/Pleura: Scattered areas of ground-glass opacities throughout the lung parenchyma likely representing hypoperfusion. Atelectasis versus developing pulmonary infarcts in the bilateral lower lobes dependently. 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 Musculoskeletal: Sclerotic focus within T4, minimally enlarged since July 2022. Review of the MIP images  confirms the above findings. IMPRESSION: 1. Bilateral central pulmonary emboli involving the main pulmonary arteries and extending to all lobar branches with nearly occlusive clot to the right lower lobe, left upper lobe and several segmental branches. 2. Positive for acute PE with CT evidence of right heart strain (RV/LV Ratio = 1.6) consistent with at least submassive (intermediate risk) PE. The presence of right heart strain has been associated with an increased risk of morbidity and mortality. 3. Scattered areas of ground-glass opacities throughout the lung parenchyma likely representing hypoperfusion. 4. Atelectasis versus developing pulmonary infarcts in the bilateral lower lobes dependently. 5. Mildly increased sclerotic focus within T4 vertebral body. Please correlate clinically. Electronically Signed: By: Fidela Salisbury M.D. On: 03/17/2022 19:40   DG Chest 1 View  Result Date: 03/16/2022 CLINICAL DATA:  Provided history: Shortness of breath. EXAM: CHEST  1 VIEW COMPARISON:  CT angiogram chest 02/11/2022. Prior chest radiographs 02/11/2022 and earlier. FINDINGS: Heart size within normal limits. Linear opacities within the left mid-to-lower lung field. No appreciable airspace consolidation within the right lung. No evidence of pleural effusion or pneumothorax. No acute bony abnormality identified. IMPRESSION: Linear opacities within the left mid-to-lower lung field, with an appearance favoring atelectasis. No definite airspace consolidation. Electronically Signed   By: Kellie Simmering D.O.   On: 03/16/2022 12:53   CT ANGIO LOW EXTREM LEFT W &/OR WO CONTRAST  Result Date: 03/12/2022 CLINICAL DATA:  Persistent ischemia of the left lower extremity. EXAM: CT ANGIOGRAPHY LOWER LEFT EXTREMITY TECHNIQUE: Multidetector CT imaging of the abdomen, pelvis and left lower extremities was performed using the standard protocol during bolus administration of intravenous contrast. Multiplanar CT image  reconstructions and MIPs were obtained to evaluate the vascular anatomy. Multidetector CT imaging of the abdomen, pelvis and lower extremities was performed using the standard protocol during bolus administration of intravenous contrast. Multiplanar CT image reconstructions and MIPs were obtained to evaluate the vascular anatomy. CONTRAST:  165m OMNIPAQUE IOHEXOL 350 MG/ML SOLN COMPARISON:  CT angiography of abdominal aorta with iliofemoral runoff dated 03/11/2022. FINDINGS: VASCULAR Aorta: Normal caliber without aneurysm or dissection. Scattered atherosclerotic calcifications. No significant stenosis. IMA: Stenosis at the origin due to aortic plaque. IMA is patent. LEFT Lower Extremity Inflow: Common, internal and external iliac arteries are patent without evidence of aneurysm, dissection, vasculitis or significant stenosis. Outflow: Left common femoral artery is patent. Left profunda femoral arteries are patent. Left SFA is patent with mild atherosclerotic disease. Nonocclusive filling defect in the distal left SFA on Image 161/4. Interval placement of vascular stent within left popliteal artery at the level of the knee across the site of previous intraluminal thrombus. The stented portion of the popliteal artery appears patent. Runoff: As noted previously, the proximal and mid anterior tibial artery are patent. Signs of thromboembolic disease in the runoff vessels of the left lower extremity are again noted including: -Persistent  abrupt occlusion in the distal anterior tibial artery, image 313/4. -Persistent distal thrombus in the posterior tibial artery noted, image 308/4. -persistent distal occlusive thrombus in the peroneal artery with signs of distal reconstitution, image 320/4. Veins: No obvious venous abnormality within the limitations of this arterial phase study. Review of the MIP images confirms the above findings. Non-vascular Bowel: Scattered colonic diverticula. No acute abnormality involving the  appendix. No evidence for bowel dilatation or focal bowel inflammation. Lymphatic: No lymph node enlargement in the abdomen or pelvis. Reproductive: Patient has penile implants within old fluid reservoir on right side and a new fluid reservoir on the left. Subcutaneous gas around this new left device is compatible with recent surgery. There is probably a small prostate with postoperative changes. Other: Negative for free fluid. Negative for free air. Subcutaneous gas extending into the left scrotum compatible with prior surgery. Cannot exclude a small amount of fluid in left scrotum. Musculoskeletal: No acute bone abnormality. IMPRESSION: 1. Interval placement of vascular stent within the left popliteal artery at the level of the knee across the site of previous intraluminal thrombus. The stented portion of the popliteal artery appears patent. 2. Persistent occlusion of the distal left anterior tibial, posterior tibial arteries and peroneal artery compatible with thromboembolic disease. Distal reconstitution of the peroneal artery as noted previously. 3. Since the previous exam there are no signs to suggest acute proximal occlusion to account for the progressive left foot ischemia. 4. Nonocclusive filling defect within the distal left SFA. 5. Aortic Atherosclerosis (ICD10-I70.0). Electronically Signed   By: Kerby Moors M.D.   On: 03/12/2022 19:04   PERIPHERAL VASCULAR CATHETERIZATION  Result Date: 03/11/2022 See surgical note for result.  CT Angio Aortobifemoral W and/or Wo Contrast  Result Date: 03/11/2022 CLINICAL DATA:  Left leg that is getting worse. Concern for ischemia. Reportedly, the patient had recent urologic surgery. EXAM: CT ANGIOGRAPHY OF ABDOMINAL AORTA WITH ILIOFEMORAL RUNOFF TECHNIQUE: Multidetector CT imaging of the abdomen, pelvis and lower extremities was performed using the standard protocol during bolus administration of intravenous contrast. Multiplanar CT image reconstructions and  MIPs were obtained to evaluate the vascular anatomy. RADIATION DOSE REDUCTION: This exam was performed according to the departmental dose-optimization program which includes automated exposure control, adjustment of the mA and/or kV according to patient size and/or use of iterative reconstruction technique. CONTRAST:  168m OMNIPAQUE IOHEXOL 350 MG/ML SOLN COMPARISON:  CT abdomen and pelvis 02/05/2022 FINDINGS: VASCULAR Aorta: Normal caliber without aneurysm or dissection. Scattered atherosclerotic calcifications. No significant stenosis. Celiac: Patent without evidence of aneurysm, dissection, vasculitis or significant stenosis. Splenic artery is heavily calcified. SMA: Patent without evidence of aneurysm or significant stenosis. There is a small linear density in the mid SMA on sequence 4 image 66 and difficult to exclude focal dissection or intimal flap in this area. No other evidence for dissection or intimal flap. Renals: Mixed plaque at the origin and proximal aspect of the right renal artery causing approximately 50% stenosis. Left renal artery is patent without significant stenosis. No evidence for aneurysm or dissection involving renal arteries. IMA: Stenosis at the origin due to aortic plaque.  IMA is patent. RIGHT Lower Extremity Inflow: Common, internal and external iliac arteries are patent without evidence of aneurysm, dissection, vasculitis or significant stenosis. Outflow: Right common femoral artery is patent. Proximal right profunda femoral arteries are patent. Proximal right SFA is patent. Decreased opacification in the mid and distal right SFA on the initial set of images. The right popliteal artery is not  opacified with contrast on the initial set of images compared to the left lower extremity but there is flow in the right popliteal artery on the delayed images suggesting slow flow in the right popliteal artery. Runoff: Limited evaluation but there may be a small amount of flow the proximal  runoff vessels. LEFT Lower Extremity Inflow: Common, internal and external iliac arteries are patent without evidence of aneurysm, dissection, vasculitis or significant stenosis. Outflow: Left common femoral artery is patent. Left profunda femoral arteries are patent. Left SFA is patent with mild atherosclerotic disease. Nonocclusive filling defect in the distal left SFA on sequence 4, image 234 and there is eccentric thrombus in the left popliteal artery above the knee causing severe stenosis on sequence 4 image 284. Additional intraluminal thrombus in the popliteal artery below the knee on image 310. Configuration of this thrombus is concerning for embolic disease. Runoff: The proximal and mid anterior tibial artery is patent but there is an abrupt occlusion in the distal anterior tibial artery. Distal thrombus in the posterior tibial artery. Distal occlusion in the peroneal artery with distal reconstitution. Findings are most compatible with thromboembolic disease in the runoff vessels. Veins: No obvious venous abnormality within the limitations of this arterial phase study. Review of the MIP images confirms the above findings. NON-VASCULAR Lower chest: Calcified granuloma in the left lower lobe. No pleural effusions. Hepatobiliary: Normal appearance of the liver and gallbladder. No biliary dilatation. Pancreas: Unremarkable. No pancreatic ductal dilatation or surrounding inflammatory changes. Spleen: Normal in size without focal abnormality. Adrenals/Urinary Tract: Normal appearance of the adrenal glands. Bilateral renal cysts without hydronephrosis. Again noted is a small exophytic hyperdense structure in the inferior left kidney that measures up to 1.1 cm on sequence 4 image 88. 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 Tiny focus of gas in the urinary bladder which may be iatrogenic. No ureter dilatation. Focal low-density near  the base of the bladder likely related to previous bladder or urethral surgery. Stomach/Bowel: Scattered colonic diverticula. No acute abnormality involving the appendix. No evidence for bowel dilatation or focal bowel inflammation. Lymphatic: No lymph node enlargement in the abdomen or pelvis. Reproductive: Patient has penile implants within old fluid reservoir on right side and a new fluid reservoir on the left. Subcutaneous gas around this new left device is compatible with recent surgery. There is probably a small prostate with postoperative changes. Other: Negative for free fluid. Negative for free air. Subcutaneous gas extending into the left scrotum compatible with prior surgery. Cannot exclude a small amount of fluid in left scrotum. Musculoskeletal: No acute bone abnormality. IMPRESSION: VASCULAR 1. Asymmetric flow to the lower extremities with delayed flow in the right lower extremity. Evidence for eccentric thrombus in the left outflow vessels with distal occlusions in left runoff vessels. Findings are concerning for thromboembolic disease based on the disease configuration. Delayed filling of the right popliteal artery is concerning for underlying occlusions and thromboembolic disease in the right runoff vessels. 2.  Aortic Atherosclerosis (ICD10-I70.0). 3. 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 4. Question small intimal flap or focal dissection in the SMA. 5. Stenosis at the origin of the IMA related to aortic plaque. NON-VASCULAR 1. New postoperative changes in the pelvis and left scrotum associated with penile prosthesis. 2. Bilateral renal cysts. 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 These results were called by telephone at the time  of interpretation on 03/11/2022 at 2:57 pm to provider [facility] , who verbally acknowledged these results. Electronically Signed   By: Markus Daft M.D.   On: 03/11/2022 15:05    LHC 12/25/2020 - Dr. Isa Rankin Ost LAD to Prox LAD lesion is 95% stenosed. Calcification moderate Prox Cx lesion is 50% stenosed. Unable to successfully cannulate and engaged right coronary artery which we think is nondominant Mildly reduced borderline left ventricular function with anterior apical hypokinesis ejection fraction around 45 to 50%   Conclusion Inpatient diagnostic cardiac cath right radial approach.   LV function borderline to mildly depressed EF around 45 to 50% with anterior apical hypokinesis Coronaries Left main large relatively free of disease LAD was large with a complex calcified proximal lesion 95% Circumflex moderate disease but large dominant system Right coronary artery was not successfully cannulated but appears to be nondominant and selective shots Complications Patient is being transferred to [facility] for possible complex intervention of proximal LAD versus CABG Patient's been transferred to [facility]  12/28/20 - DR. Sherren Mocha 1.  Successful PCI of the proximal LAD using a 3.0 x 18 mm resolute Onyx DES 2.  Total occlusion of the native RCA with collateral vessel supplying the mid and distal RCA and codominant PDA branch   Recommend: Resume warfarin today.  Continue aspirin x30 days.  Continue clopidogrel x12 months if tolerated.  Patient will revisit warfarin versus a DOAC drug with his primary care physician, Dr. Sabra Heck.  Patient to follow-up with Dr. Clayborn Bigness.  He should be eligible for hospital discharge tomorrow morning.  LHC 01/06/21 - Dr. Shelva Majestic  Lat 1st Diag lesion is 90% stenosed. Dist LAD lesion is 30% stenosed. Previously placed Prox LAD to Mid LAD stent (unknown type) is widely patent. Mid LAD lesion is 40% stenosed. Ost Cx to Prox Cx lesion is 30% stenosed. 1st LPL lesion is 30% stenosed. Ost RCA to Prox RCA lesion is 100% stenosed. LPAV-1 lesion is 40% stenosed. LPAV-2 lesion is 20% stenosed.   Widely patent proximal LAD stent from recent  intervention of December 28, 2020.  Diagonal vessel of the LAD proximal to the stented segment gives off a very small subbranch that has a 90% ostial stenosis and is very small caliber branch vessel, there is smooth 40% narrowing beyond the stented segment in the LAD proximal to a bifurcating diagonal vessel and there is mild 30% mid LAD stenosis.   Left circumflex vessel is a dominant vessel that has smooth 30% ostial stenosis.  There is 40% distal AV groove stenosis with 30% narrowing in a distal marginal vessel.   Totally occluded proximal RCA with right to right bridging collaterals.   RECOMMENDATION: Catheterization does not suggest any stent restenosis or edge dissection.  Patient has been on amlodipine and metoprolol.  Recommend initiation of isosorbide and if ongoing symptoms possible ranolazine.  I reviewed the images with Dr. Martinique.  Continue triple drug therapy for 30 days following initial stent with plans for Plavix/warfarin discontinuance of aspirin at 30 days.  ECHO 12/2020  1. Left ventricular ejection fraction, by estimation, is 50 to 55%. The  left ventricle has low normal function. The left ventricle has no regional  wall motion abnormalities. There is mild left ventricular hypertrophy.  Left ventricular diastolic  parameters are consistent with Grade I diastolic dysfunction (impaired  relaxation).   2. Right ventricular systolic function is normal. The right ventricular  size is normal. Tricuspid regurgitation signal is inadequate for assessing  PA pressure.   3. Left atrial size was moderately dilated.   4. The mitral valve is normal in structure. Trivial mitral valve  regurgitation.   5. The aortic valve is tricuspid. Aortic valve regurgitation is mild.  Mild aortic valve sclerosis is present, with no evidence of aortic valve  stenosis.   6. The inferior vena cava is normal in size with greater than 50%  respiratory variability, suggesting right atrial pressure of 3  mmHg.    ASSESSMENT AND PLAN:  Elsworth Ledin is a 67yoM with a PMH of CAD-NSTEMI s/p PCI with DES to proximal LAD with noted CTO RCA with collaterals 12/2020, HFpEF (LVEF 50-55%) history of recurrent DVTs, and postoperative currently on Eliquis, hypertension, hyperlipidemia, type 2 diabetes, history of prostate cancer s/p resection, history of bladder cancer who presented to [facility] ED from Dr. Ammie Ferrier office on 6/23 after concern of critical left lower limb ischemia.  Notably, his Eliquis was held on for 1 week in anticipation of an artificial urinary sphincter placement performed on 6/21.  He was seen by vascular surgery and had lower extremity bypass on 6/23 and went back to the OR on 6/24 for left lower extremity thrombectomy and was placed on a heparin drip.  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  Post procedurally, he developed tachycardia and hypoxia with a new 6L odygen requirement thought to be secondary to flash pulmonary edema, improved somewhat with IV Lasix.  CTA chest was performed the evening of 6/29 which revealed bilateral pulmonary emboli with nearly occlusive clot to the right lower lobe, left upper lobe, and several segmental branches with CT evidence of right heart strain. Mechanical thrombectomy pending 6/30. Cardiology is consulted for further assistance with his tachycardia.  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  His heart rates peaked and the 180s in SVT, he was hypotensive  yesterday afternoon, requiring transfer to stepdown.  He received 2 doses of IV digoxin 0.25 mg with good control of his heart rate, and has been in sinus rhythm in the 90s since in stepdown and throughout this morning.  Troponin trended 311 on 6/28, rechecked on 6/30 at 2300 and down trended to 1800 thereafter, this is likely demand in the setting of submassive PE, and in the absence of chest discomfort. -Agree with current therapy for treatment of his pulmonary emboli, scheduled to undergo mechanical thrombectomy this morning with Dr. Lucky Cowboy -Continue to treat underlying causes of increased adrenergic tone, including pain, hypoxia -Anticoagulants as per vascular surgery and primary, will likely need lifelong AC with his recurrent DVT/PE. -Echocardiogram pending to evaluate for right heart strain -S/p digoxin 0.25 mg x 2, if hypotension becomes a concern again, can load with amiodarone bolus and infusion -Continue metoprolol tartrate 12.5 mg twice daily -monitor and replete electrolytes   #CAD with NSTEMI s/p PCI with DES to proximal LAD, CTO of RCA with collaterals 12/2020 Patient denies chest pain, EKGs have been nonischemic -Continue clopidogrel,, pravastatin 40 mg daily  This patient's plan of care was discussed and created with Dr. Saralyn Pilar and he  is in agreement.  Signed: Tristan Schroeder , PA-C 03/18/2022, 9:07 AM [facility]

## 2022-03-18 NOTE — Progress Notes (Signed)
Patient received from IR via bed accompanied by IR staff. Reattached to cardiac monitor, pulse ox and BP cuff. Frequency set to q 15 min interval. Right groin site intact with PAD, without bleeding or hematoma. DP and PT pulse are palpable at 2+. Patient is alert, denies pain. External urinary catheter reattached to suction. Visitor at bedside. Care nurse notified that patient has returned to room. Pt states is comfortable and has no requests at this time.

## 2022-03-18 NOTE — Op Note (Signed)
[facility]  Percutaneous Study/Intervention Procedural Note   Date of Surgery: 03/18/2022,2:06 PM  Surgeon: Leotis Pain  Pre-operative Diagnosis: Symptomatic bilateral pulmonary emboli  Post-operative diagnosis:  Same  Procedure(s) Performed:  1.  Contrast injection right heart  2.  Thrombolysis with 12 mg of tPA, 6 mg in each of the main pulmonary arteries  3.  Mechanical thrombectomy using the penumbra 16 Lightning/Flash device to the left lower and left upper lobe pulmonary arteries and to the right lower, right middle, and right upper lobe pulmonary arteries  4.  Selective catheter placement right lower lobe, middle lobe, and upper lobe pulmonary artery  5.  Selective catheter placement left lower lobe and upper lobe pulmonary artery    Anesthesia: Conscious sedation was administered under my direct supervision by the interventional radiology RN. IV Versed plus fentanyl were utilized. Continuous ECG, pulse oximetry and blood pressure was monitored throughout the entire procedure.  Versed and fentanyl were administered intravenously.  Conscious sedation was administered for a total of 34 minutes using 2 mg of Versed and 50 mcg of Fentanyl.  EBL: 450 cc  Sheath: 16 French right femoral vein  Contrast: 60 cc   Fluoroscopy Time: 11.7 minutes  Indications:  Patient presents with pulmonary emboli. The patient is symptomatic with hypoxemia and dyspnea on exertion.  There is evidence of right heart strain on the CT angiogram. The patient is otherwise a good candidate for intervention and even the long-term benefits pulmonary angiography with thrombolysis is offered. The risks and benefits are reviewed long-term benefits are discussed. All questions are answered patient agrees to proceed.  Procedure:  Phillip [name] a 76 y.o. male who was identified and appropriate procedural time out was performed.  The patient was then placed supine on the table and prepped and  draped in the usual sterile fashion.  Ultrasound was used to evaluate the right common femoral vein.  It was patent, as it was echolucent and compressible.  A digital ultrasound image was acquired for the permanent record.  A Seldinger needle was used to access the right common femoral vein under direct ultrasound guidance.  A 0.035 J wire was advanced without resistance and a 5Fr sheath was placed and then upsized to a 16 [place] sheath.    The wire and pigtail catheter were then negotiated into the right atrium and bolus injection of contrast was utilized to demonstrate the right ventricle and the pulmonary artery outflow.  This was dilated and sluggish and there appeared to be thrombus within the right ventricle and potentially right atrium. The advantage wire and JR4 catheter were then negotiated into the main pulmonary artery where hand injection of contrast was utilized to demonstrate the pulmonary arteries and confirm the locations of the pulmonary emboli.  Selective cannulation of the left lower lobe and left upper lobe pulmonary arteries was done first, and selective and showed extensive thrombus throughout both vessels.  I then transitioned over to the right side with a JR4 catheter and selective right upper lobe pulmonary arteries all showed extensive thrombus burden   TPA was reconstituted and delivered onto the table. A total of 12 milligrams of TPA was utilized.  6 mg was administered on the left side and 6 mg was administered on the right side. This was then allowed to dwell.  The Penumbra Cat 16 Lightning/Flash catheter was then advanced up into the pulmonary vasculature. The left lung was addressed first. Catheter was negotiated into the left lower lobe and  mechanical thrombectomy was performed.  Passes were made with the separator.  Follow-up imaging demonstrated a good result and therefore the catheter was renegotiated into the left upper lobe pulmonary artery and again mechanical  thrombectomy was performed. Passes were made with both the Penumbra catheter itself as well as introducing the separator. Follow-up imaging was then performed.  Significant improvement in thrombus burden in both the left lower and left upper lobe pulmonary arteries was seen so I turned my attention to the right side.  The Penumbra Cat 16 Lightning/Flash catheter was then negotiated to the opposite side. The right lung was then addressed. Catheter was negotiated into the right lower lobe and mechanical thrombectomy was performed.  The separator was utilized.  Follow-up imaging demonstrated a good result and therefore the catheter was renegotiated into the right middle lobe pulmonary artery and again mechanical thrombectomy was performed. Passes were made with both the Penumbra catheter itself as well as introducing the separator.  Finally, the catheter was advanced into the right upper lobe pulmonary artery or mechanical thrombectomy was performed.  The separator was not used in the right upper lobe because it required the select catheter and the advantage wire to cannulate the right upper lobe pulmonary artery.  Follow-up imaging was then performed.  Marked reduction in thrombus burden was seen in all 3 lobar branches of the right pulmonary artery  After review these images wires were reintroduced and the catheters removed. Then, the sheath is then pulled and pressures held. A safeguard is placed.    Findings:   Right heart imaging:  Right atrium and right ventricle and the pulmonary outflow tract appears dilated with clot in the right atrium and ventricle  Right lung: Extensive thrombus burden throughout the right pulmonary arteries in all 3 main pulmonary arteries and branches  Left lung: Extensive thrombus burden in the left lower lobe and left upper lobe pulmonary arteries.    Disposition: Patient was taken to the recovery room in stable condition having tolerated the procedure well.  Phillip [name] 03/18/2022,2:06 PM

## 2022-03-18 NOTE — TOC Progression Note (Signed)
Transition of Care [facility]) - Progression Note    Patient Details  Name: Phillip [name] MRN: [id number] Date of Birth: 1946/07/26  Transition of Care [facility] CM/SW [place], RN Phone Number: 03/18/2022, 11:20 AM  Clinical Narrative:   Patient transferred overnight to ICU, anticipated vascular procedure today.  TOC will follow when back in room, medically stable.  Will follow for additional evaluations/recommendations for post acute disposition.    Expected Discharge Plan:  (TBD) Barriers to Discharge: Continued Medical Work up  Expected Discharge Plan and Services Expected Discharge Plan:  (TBD)       Living arrangements for the past 2 months: Single Family Home                                       Social Determinants of Health (SDOH) Interventions    Readmission Risk Interventions     No data to display

## 2022-03-18 NOTE — Progress Notes (Signed)
*  PRELIMINARY RESULTS* Echocardiogram 2D Echocardiogram has been performed.  Phillip [name] 03/18/2022, 11:22 AM

## 2022-03-18 NOTE — Progress Notes (Addendum)
[place] for heparin infusion initiation and monitoring Indication: pulmonary embolus  Allergies  Allergen Reactions   Doxazosin Other (See Comments)    Other reaction(s): Unknown    Patient Measurements: Height: '5\' 9"'$  (175.3 cm) Weight: 73.1 kg (161 lb 2.5 oz) IBW/kg (Calculated) : 70.7 Heparin Dosing Weight: 79.3 kg  Vital Signs: Temp: 98.2 F (36.8 C) (06/30 2000) Temp Source: Oral (06/30 2000) BP: 147/76 (06/30 2100) Pulse Rate: 87 (06/30 2100)  Labs: Recent Labs    03/16/22 0413 03/16/22 8676 03/16/22 1448 03/17/22 0604 03/18/22 0519 03/18/22 0939 03/18/22 2209  HGB 13.0  --   --  11.6* 10.5*  --   --   HCT 41.5  --   --  37.2* 34.4*  --   --   PLT 458*  --   --  396 335  --   --   APTT  --  84*  --   --  149*  --  99*  HEPARINUNFRC  --  0.46  --   --   --   --   --   CREATININE 1.11  --   --  1.20 1.57*  --   --   TROPONINIHS  --   --  311*  --  2,277* 1,819*  --      Estimated Creatinine Clearance: 40.7 mL/min (A) (by C-G formula based on SCr of 1.57 mg/dL (H)).   Medical History: Past Medical History:  Diagnosis Date   Arthritis    Basal cell carcinoma    L clavicle, L prox forearm- removed years ago    Bladder cancer (HCC)    Bladder neck contracture    CAD (coronary artery disease)    a. 12/2020 NSTEMI/Cath: LM nl, LAD 95ost/p, LCX 50p, RCA nl. EF 45-50%.   Cataract    CKD (chronic kidney disease), stage III [facility])    ED (erectile dysfunction)    Frequency    GERD (gastroesophageal reflux disease)    History of pulmonary embolus (PE) 11/2018   a. Following LE DVT-->Chronic warfarin.   Hyperlipidemia LDL goal <70    Hypertension    Hypothyroidism    Incontinence of urine    sui, s/p cryoablation   Ischemic cardiomyopathy    a. 11/2018 Echo: EF 60-65%; b. 12/2020 LV Gram: EF 45-50% in setting of NSTEMI.   Kidney stones    Neuropathy    feet   Nocturia    Prostate cancer (HCC)    S/P   CRYOABLATION    Right elbow tendinitis    Right Lower Extremity DVT (deep venous thrombosis) (HCC) 11/25/2018   Vertigo    1-2x/yr   Wears glasses     Medications:  Scheduled:   Chlorhexidine Gluconate Cloth  6 each Topical Daily   clopidogrel  75 mg Oral Daily   diphenhydrAMINE       docusate sodium  100 mg Oral BID   fentaNYL       insulin aspart  0-15 Units Subcutaneous TID WC   lactulose  20 g Oral BID   levothyroxine  50 mcg Oral Q0600   melatonin  5 mg Oral QHS   metoprolol tartrate  12.5 mg Oral BID   midazolam       pantoprazole  40 mg Oral Daily   [START ON 03/19/2022] pravastatin  40 mg Oral Daily   senna-docusate  2 tablet Oral BID   sodium chloride flush  3 mL Intravenous Q12H  Assessment: 76 y.o. 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 Patient recently having discontinued this about 1 week ago in preparation for placement of an artificial urinary sphincter procedure on on 03/09/22 and was instructed to continue to hold Eliquis 48 hours post-op. Presents to ED on 03/11/22 with pain and duskiness in the left foot extending towards the left calf. Pharmacy has been consulted for heparin infusion for VTE.   S/P thrombectomy 6/23, additional thromboembolectomy on 6/24 PM.  Pt was on heparin gtt since 6/24 @ ~ 2000. Pt was on Eliquis 10 mg PO BID. On 6/26 he was transitioned to aggrastat drip x 18 hrs with the hope for improving his forefoot perfusion, which was completed then transitioned to apixaban with last dose administered 0904 03/17/22 now with a new PE   Goal of Therapy:  Heparin level 0.3-0.7 units/ml aPTT 66 - 102 seconds Monitor platelets by anticoagulation protocol: Yes  6/30 0519 aPTT 149, supratherapeutic 6/30 2209 aPTT 99, supratherapeutic   Plan:  Decrease heparin infusion to 850 units/hr Recheck aPTT and HL w/ AM labs after rate change HL & CBC daily while on heparin  Renda Rolls, PharmD, [facility] 03/18/2022 10:46 PM

## 2022-03-18 NOTE — Progress Notes (Signed)
[facility]  Daily Progress Note   Subjective  -   Phillip [name] is a 76 year old male who initially part presented to [facility] with an ischemic leg.  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  Following that surgery the patient began to have a new oxygen requirement and shortness of breath.  The patient underwent a CT angiogram which shows an acute PE with evidence of right heart strain.  He has bilateral emboli extending to all lobar branches.  Objective Vitals:   03/18/22 0700 03/18/22 0730 03/18/22 0800 03/18/22 0900  BP:  119/86 128/81 126/84  Pulse:  87 88 90  Resp:  19 (!) 21 17  Temp:   98.8 F (37.1 C)   TempSrc:   Oral   SpO2: 100% 100% 100% 99%  Weight:      Height:        Intake/Output Summary (Last 24 hours) at 03/18/2022 1000 Last data filed at 03/18/2022 0700 Gross per 24 hour  Intake 991.4 ml  Output 650 ml  Net 341.4 ml    PULM  requiring O2 CV  RRR VASC  recent left below-knee amputation  Laboratory CBC    Component Value Date/Time   WBC 17.9 (H) 03/18/2022 0519   HGB 10.5 (L) 03/18/2022 0519   HGB 12.7 (L) 10/25/2014 2206   HCT 34.4 (L) 03/18/2022 0519   HCT 40.9 10/25/2014 2206   PLT 335 03/18/2022 0519   PLT 188 10/25/2014 2206    BMET    Component Value Date/Time   NA 135 03/18/2022 0519   NA 141 10/25/2014 2206   K 4.7 03/18/2022 0519   K 4.0 10/25/2014 2206   CL 102 03/18/2022 0519   CL 109 (H) 10/25/2014 2206   CO2 24 03/18/2022 0519   CO2 26 10/25/2014 2206   GLUCOSE 183 (H) 03/18/2022 0519   GLUCOSE 210 (H) 10/25/2014 2206   BUN 34 (H) 03/18/2022 0519   BUN 21 08/15/2016 1618   BUN 15 10/25/2014 2206   CREATININE 1.57 (H) 03/18/2022 0519   CREATININE 1.32 (H) 10/25/2014 2206   CALCIUM 8.5 (L) 03/18/2022 0519   CALCIUM 8.2 (L) 10/25/2014 2206   GFRNONAA 46 (L) 03/18/2022 0519   GFRNONAA 58  (L) 10/25/2014 2206   GFRAA >60 03/27/2019 0637   GFRAA >60 10/25/2014 2206    Assessment/Planning: Given the patient's severity of symptoms as well as clot burden indicating evidence of right heart strain, we will proceed with a pulmonary thrombectomy.  The risk benefits and alternatives were discussed with patient.  He is agreeable to proceed. Kris Hartmann  03/18/2022, 10:00 AM

## 2022-03-18 NOTE — Progress Notes (Signed)
Progress Note   Patient: Phillip [name] ATF:[id number] DOB: 1945-10-27 DOA: 03/11/2022     7 DOS: the patient was seen and examined on 03/18/2022   Brief hospital course: Phillip [name] is a 76 y.o. male with medical history significant for recurrent DVTs, post operative PE on Eliquis, unspecified thrombophilia, hypertension, hyperlipidemia, nephrolithiasis, malignant neoplasm of prostate post resection, bladder cancer, osteoarthritis, coronary artery disease status post PCI with stenting on Plavix, chronic diastolic CHF, who presents to [facility] ED from his PCP's office, Dr. Sabra Heck, due to concern for left critical lower limb ischemia.   Patient is seen by vascular surgery, had a lower extremity bypass surgery on 6/23.  Went back to the OR on 6/24 for L LE thrombectomy.  Patient also placed on heparin drip.  It appears that the patient left leg is nonsalvageable.  BKA scheduled. 6/28.  Developed episode of PSVT, hypoxia.  Appears to be flash pulm edema, given IV Lasix. BKA performed on 6/28. 6/30.  Patient developed hypoxia and tachycardia again with atrial fibrillation.  CT angiogram was performed 6/29 showed bilateral PE.  Thrombectomy performed on 6/30, heparin drip restarted.  Assessment and Plan: Acute hypoxemic respiratory failure Bilateral pulmonary emboli. Flash pulmonary edema secondary to acute on chronic diastolic congestive heart failure. Paroxysmal A fib Patient had a another episode of tachycardia, this time appears to be atrial fibrillation with RVR. Received 2 doses digoxin.  CT angiogram confirmed bilateral pulm emboli. Patient is put back on IV heparin. Pulmonary thrombectomy will be performed today. I will continue IV heparin for the least 48 hours before transition to oral Eliquis.  Constipation. Patient has not had a bowel movement since admission, received tapwater enema: Had 2 bowel movements afterwards.  Continue stool softeners   Elevated troponin Appears to be  secondary to pulmonary emboli.  Will be seen by cardiology.   Left lower extremity ischemia. Failed effort with revascularization and thrombectomy. Patient had a successful BKA.  Continue pain control.  Start PT/OT.   History of bladder cancer s/p resection. History of prostate cancer. Urinary stress incontinence.  Status post artificial urinary sphincter on 6/21. Patient doing well.     Acute kidney injury. Condition improved.  Coronary artery disease status post PCI and stenting. Continue Plavix, statin.  History of DVT and PE. Reactive thrombocytosis. Acquired hemophilia. After discussion with vascular surgery, start Plavix and Eliquis  Essential hypertension. Continue metoprolol.       Subjective:  Patient had another episode of tachycardia, hypoxia last night, CT angiogram confirmed PE. Patient feels better this morning but still on high flow oxygen.  Physical Exam: Vitals:   03/18/22 0900 03/18/22 1000 03/18/22 1100 03/18/22 1141  BP: 126/84 125/76 108/62 122/75  Pulse: 90 91 91 85  Resp: '17 15 19 20  '$ Temp:      TempSrc:      SpO2: 99% 100% 100% 97%  Weight:      Height:       General exam: Appears calm and comfortable  Respiratory system: Clear to auscultation. Respiratory effort normal. Cardiovascular system: S1 & S2 heard, RRR. No JVD, murmurs, rubs, gallops or clicks. No pedal edema. Gastrointestinal system: Abdomen is nondistended, soft and nontender. No organomegaly or masses felt. Normal bowel sounds heard. Central nervous system: Alert and oriented. No focal neurological deficits. Extremities: Symmetric 5 x 5 power. Skin: No rashes, lesions or ulcers Psychiatry: Judgement and insight appear normal. Mood & affect appropriate.   Data Reviewed:  Reviewed the CT angiogram results,  all lab results.  Family Communication:   Disposition: Status is: Inpatient Remains inpatient appropriate because: Severity of disease, IV treatment.  Planned  Discharge Destination:  pending    Time spent: 55 minutes  Author: Sharen Hones, MD 03/18/2022 2:10 PM  For on call review [email or web address].

## 2022-03-18 NOTE — Progress Notes (Signed)
[place] for heparin infusion initiation and monitoring Indication: pulmonary embolus  Allergies  Allergen Reactions   Doxazosin Other (See Comments)    Other reaction(s): Unknown    Patient Measurements: Height: '5\' 9"'$  (175.3 cm) Weight: 73.1 kg (161 lb 2.5 oz) IBW/kg (Calculated) : 70.7 Heparin Dosing Weight: 79.3 kg  Vital Signs: Temp: 98.9 F (37.2 C) (06/30 0500) Temp Source: Oral (06/30 0500) BP: 148/87 (06/30 0500) Pulse Rate: 100 (06/30 0400)  Labs: Recent Labs    03/15/22 2200 03/16/22 0413 03/16/22 0413 03/16/22 0656 03/16/22 1448 03/17/22 0604 03/18/22 0519  HGB  --  13.0   < >  --   --  11.6* 10.5*  HCT  --  41.5  --   --   --  37.2* 34.4*  PLT  --  458*  --   --   --  396 335  APTT 44*  --   --  84*  --   --  149*  HEPARINUNFRC  --   --   --  0.46  --   --   --   CREATININE  --  1.11  --   --   --  1.20  --   TROPONINIHS  --   --   --   --  311*  --   --    < > = values in this interval not displayed.     Estimated Creatinine Clearance: 53.2 mL/min (by C-G formula based on SCr of 1.2 mg/dL).   Medical History: Past Medical History:  Diagnosis Date   Arthritis    Basal cell carcinoma    L clavicle, L prox forearm- removed years ago    Bladder cancer (HCC)    Bladder neck contracture    CAD (coronary artery disease)    a. 12/2020 NSTEMI/Cath: LM nl, LAD 95ost/p, LCX 50p, RCA nl. EF 45-50%.   Cataract    CKD (chronic kidney disease), stage III [facility])    ED (erectile dysfunction)    Frequency    GERD (gastroesophageal reflux disease)    History of pulmonary embolus (PE) 11/2018   a. Following LE DVT-->Chronic warfarin.   Hyperlipidemia LDL goal <70    Hypertension    Hypothyroidism    Incontinence of urine    sui, s/p cryoablation   Ischemic cardiomyopathy    a. 11/2018 Echo: EF 60-65%; b. 12/2020 LV Gram: EF 45-50% in setting of NSTEMI.   Kidney stones    Neuropathy    feet   Nocturia    Prostate  cancer (HCC)    S/P   CRYOABLATION   Right elbow tendinitis    Right Lower Extremity DVT (deep venous thrombosis) (HCC) 11/25/2018   Vertigo    1-2x/yr   Wears glasses     Medications:  Scheduled:   Chlorhexidine Gluconate Cloth  6 each Topical Daily   clopidogrel  75 mg Oral Daily   docusate sodium  100 mg Oral BID   insulin aspart  0-15 Units Subcutaneous TID WC   levothyroxine  50 mcg Oral Q0600   melatonin  5 mg Oral QHS   metoprolol tartrate  12.5 mg Oral BID   pantoprazole  40 mg Oral Daily   senna-docusate  2 tablet Oral BID   sodium chloride flush  3 mL Intravenous Q12H    Assessment: 76 y.o. 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 Patient recently having discontinued this about 1 week ago  in preparation for placement of an artificial urinary sphincter procedure on on 03/09/22 and was instructed to continue to hold Eliquis 48 hours post-op. Presents to ED on 03/11/22 with pain and duskiness in the left foot extending towards the left calf. Pharmacy has been consulted for heparin infusion for VTE.   S/P thrombectomy 6/23, additional thromboembolectomy on 6/24 PM.  Pt was on heparin gtt since 6/24 @ ~ 2000. Pt was on Eliquis 10 mg PO BID. On 6/26 he was transitioned to aggrastat drip x 18 hrs with the hope for improving his forefoot perfusion, which was completed then transitioned to apixaban with last dose administered 0904 03/17/22 now with a new PE   Goal of Therapy:  Heparin level 0.3-0.7 units/ml aPTT 66 - 102 seconds Monitor platelets by anticoagulation protocol: Yes  6/30 0519 aPTT 149, supratherapeutic   Plan:  Hold infusion for 1 hour Restart heparin infusion at 1000 units/hr Check aPTT level in 8 hours after restart HL & CBC daily while on heparin  Renda Rolls, PharmD, [facility] 03/18/2022 6:55 AM

## 2022-03-18 NOTE — Progress Notes (Signed)
Patient was eating dinner on room air when oxygen saturations decreased to 70%.   Placed patient on 2L nasal cannula and coached patient on taking breathes in through his nose and out through his mouth between bites.  Patient's oxygen saturation levels slow to recover requiring oxygen to be increased to 5L nasal cannula.      Post dinner patient fell asleep and oxygen levels decreased to 87-89%.   Lung sounds auscultated and patient assessed.  Patient denied any feelings of shortness of breath or chest pain.   Patient placed on HFNC at 6L.

## 2022-03-18 NOTE — Progress Notes (Signed)
PT Cancellation Note  Patient Details Name: Phillip [name] MRN: [id number] DOB: 03/08/1946   Cancelled Treatment:    Reason Eval/Treat Not Completed: Medical issues which prohibited therapy. Per chart review pt transferred to ICU and workup revealed acute PE. PT to sign off due to a transition to higher level of care and change in medical status. Please re-consult PT when patient is medically appropriate. (Per chart review pt may undergo thrombectomy.)   Lieutenant Diego PT, DPT 8:22 AM,03/18/22

## 2022-03-19 ENCOUNTER — Inpatient Hospital Stay: Payer: Medicare HMO

## 2022-03-19 ENCOUNTER — Encounter
Admission: EM | Disposition: A | Discharge status: Skilled Nursing Facility | Payer: Self-pay | Source: Ambulatory Visit | Attending: Internal Medicine

## 2022-03-19 DIAGNOSIS — I5033 Acute on chronic diastolic (congestive) heart failure: Secondary | ICD-10-CM | POA: Diagnosis not present

## 2022-03-19 DIAGNOSIS — J9601 Acute respiratory failure with hypoxia: Secondary | ICD-10-CM | POA: Diagnosis not present

## 2022-03-19 DIAGNOSIS — I2699 Other pulmonary embolism without acute cor pulmonale: Secondary | ICD-10-CM | POA: Diagnosis not present

## 2022-03-19 DIAGNOSIS — I7090 Unspecified atherosclerosis: Secondary | ICD-10-CM | POA: Diagnosis not present

## 2022-03-19 HISTORY — PX: IVC FILTER INSERTION: CATH118245

## 2022-03-19 LAB — GLUCOSE, CAPILLARY
Glucose-Capillary: 117 mg/dL — ABNORMAL HIGH (ref 70–99)
Glucose-Capillary: 191 mg/dL — ABNORMAL HIGH (ref 70–99)
Glucose-Capillary: 169 mg/dL — ABNORMAL HIGH (ref 70–99)
Glucose-Capillary: 137 mg/dL — ABNORMAL HIGH (ref 70–99)
Glucose-Capillary: 156 mg/dL — ABNORMAL HIGH (ref 70–99)

## 2022-03-19 LAB — CBC
HCT: 28.1 % — ABNORMAL LOW (ref 39.0–52.0)
Hemoglobin: 8.7 g/dL — ABNORMAL LOW (ref 13.0–17.0)
MCH: 27.4 pg (ref 26.0–34.0)
MCHC: 31 g/dL (ref 30.0–36.0)
MCV: 88.4 fL (ref 80.0–100.0)
Platelets: 395 10*3/uL (ref 150–400)
RBC: 3.18 MIL/uL — ABNORMAL LOW (ref 4.22–5.81)
RDW: 16.2 % — ABNORMAL HIGH (ref 11.5–15.5)
WBC: 15.3 10*3/uL — ABNORMAL HIGH (ref 4.0–10.5)
nRBC: 0 % (ref 0.0–0.2)

## 2022-03-19 LAB — HEPARIN LEVEL (UNFRACTIONATED)
Heparin Unfractionated: 0.48 IU/mL (ref 0.30–0.70)
Heparin Unfractionated: 0.36 IU/mL (ref 0.30–0.70)

## 2022-03-19 LAB — APTT
aPTT: 73 seconds — ABNORMAL HIGH (ref 24–36)
aPTT: 64 seconds — ABNORMAL HIGH (ref 24–36)
aPTT: 40 seconds — ABNORMAL HIGH (ref 24–36)

## 2022-03-19 SURGERY — IVC FILTER INSERTION
Anesthesia: Moderate Sedation

## 2022-03-19 MED ORDER — MIDAZOLAM HCL 2 MG/2ML IJ SOLN
INTRAMUSCULAR | Status: AC
  Filled 2022-03-19: qty 2

## 2022-03-19 MED ORDER — HEPARIN BOLUS VIA INFUSION
2400.0000 [IU] | Freq: Once | INTRAVENOUS | Status: AC
  Administered 2022-03-20: 2400 [IU] via INTRAVENOUS
  Filled 2022-03-19: qty 2400

## 2022-03-19 MED ORDER — FENTANYL CITRATE (PF) 100 MCG/2ML IJ SOLN
INTRAMUSCULAR | Status: AC
  Filled 2022-03-19: qty 2

## 2022-03-19 MED ORDER — FUROSEMIDE 10 MG/ML IJ SOLN
40.0000 mg | Freq: Once | INTRAMUSCULAR | Status: AC
  Administered 2022-03-19: 40 mg via INTRAVENOUS

## 2022-03-19 MED ORDER — LACTULOSE 10 GM/15ML PO SOLN: 20.0000 g | Freq: Two times a day (BID) | ORAL | Status: AC

## 2022-03-19 MED ORDER — FENTANYL CITRATE (PF) 100 MCG/2ML IJ SOLN
INTRAMUSCULAR | Status: DC | PRN
  Administered 2022-03-19: 25 ug via INTRAVENOUS

## 2022-03-19 MED ORDER — FUROSEMIDE 10 MG/ML IJ SOLN
INTRAMUSCULAR | Status: AC
  Filled 2022-03-19: qty 4

## 2022-03-19 MED ORDER — IODIXANOL 320 MG/ML IV SOLN
INTRAVENOUS | Status: DC | PRN
  Administered 2022-03-19: 20 mL

## 2022-03-19 MED ORDER — POLYETHYLENE GLYCOL 3350 17 G PO PACK
17.0000 g | PACK | Freq: Two times a day (BID) | ORAL | Status: DC
  Administered 2022-03-20 – 2022-03-26 (×4): 17 g via ORAL
  Filled 2022-03-19 (×8): qty 1

## 2022-03-19 SURGICAL SUPPLY — 10 items
CANNULA 5F STIFF (CANNULA) ×1 IMPLANT
CATH ANGIO 5F PIGTAIL 65CM (CATHETERS) ×2 IMPLANT
COVER PROBE U/S 5X48 (MISCELLANEOUS) ×1 IMPLANT
KIT POP OPTION ELITE FILTER (Filter) ×1 IMPLANT
PACK ANGIOGRAPHY (CUSTOM PROCEDURE TRAY) ×2 IMPLANT
SHEATH BRITE TIP 5FRX11 (SHEATH) ×1 IMPLANT
SYR MEDRAD MARK 7 150ML (SYRINGE) ×1 IMPLANT
TUBING CONTRAST HIGH PRESS 72 (TUBING) ×1 IMPLANT
WIRE AMPLATZ SSTIFF .035X260CM (WIRE) ×1 IMPLANT
WIRE GUIDERIGHT .035X150 (WIRE) ×1 IMPLANT

## 2022-03-19 NOTE — Progress Notes (Signed)
[facility] Cardiology  SUBJECTIVE: Patient laying in bed, eating breakfast, denies chest pain or shortness of breath   Vitals:   03/19/22 0500 03/19/22 0600 03/19/22 0800 03/19/22 0855  BP: 130/75 109/67  130/65  Pulse: 84 79  92  Resp: 15 20    Temp:  98.2 F (36.8 C) 99 F (37.2 C)   TempSrc:  Axillary Oral   SpO2: 95% 96%    Weight:      Height:         Intake/Output Summary (Last 24 hours) at 03/19/2022 0919 Last data filed at 03/19/2022 0600 Gross per 24 hour  Intake 847.92 ml  Output 1400 ml  Net -552.08 ml      PHYSICAL EXAM  General: Well developed, well nourished, in no acute distress HEENT:  Normocephalic and atramatic Neck:  No JVD.  Lungs: Clear bilaterally to auscultation and percussion. Heart: HRRR . Normal S1 and S2 without gallops or murmurs.  Abdomen: Bowel sounds are positive, abdomen soft and non-tender  Msk:  Back normal, normal gait. Normal strength and tone for age. Extremities: No clubbing, cyanosis or edema.   Neuro: Alert and oriented X 3. Psych:  Good affect, responds appropriately   LABS: Basic Metabolic Panel: Recent Labs    03/17/22 0604 03/18/22 0519  NA 136 135  K 4.8 4.7  CL 104 102  CO2 25 24  GLUCOSE 162* 183*  BUN 29* 34*  CREATININE 1.20 1.57*  CALCIUM 8.5* 8.5*  MG 2.0 2.2   Liver Function Tests: No results for input(s): "AST", "ALT", "ALKPHOS", "BILITOT", "PROT", "ALBUMIN" in the last 72 hours. No results for input(s): "LIPASE", "AMYLASE" in the last 72 hours. CBC: Recent Labs    03/18/22 0519 03/19/22 0453  WBC 17.9* 15.3*  HGB 10.5* 8.7*  HCT 34.4* 28.1*  MCV 88.9 88.4  PLT 335 395   Cardiac Enzymes: No results for input(s): "CKTOTAL", "CKMB", "CKMBINDEX", "TROPONINI" in the last 72 hours. BNP: Invalid input(s): "POCBNP" D-Dimer: No results for input(s): "DDIMER" in the last 72 hours. Hemoglobin A1C: No results for input(s): "HGBA1C" in the last 72 hours. Fasting Lipid Panel: No results for input(s):  "CHOL", "HDL", "LDLCALC", "TRIG", "CHOLHDL", "LDLDIRECT" in the last 72 hours. Thyroid Function Tests: No results for input(s): "TSH", "T4TOTAL", "T3FREE", "THYROIDAB" in the last 72 hours.  Invalid input(s): "FREET3" Anemia Panel: No results for input(s): "VITAMINB12", "FOLATE", "FERRITIN", "TIBC", "IRON", "RETICCTPCT" in the last 72 hours.  PERIPHERAL VASCULAR CATHETERIZATION  Result Date: 03/18/2022 See surgical note for result.  ECHOCARDIOGRAM COMPLETE  Result Date: 03/18/2022    ECHOCARDIOGRAM REPORT   Patient Name:   Phillip [name] Date of Exam: 03/18/2022 Medical Rec #:  [id number]        Height:       69.0 in Accession #:    [id number]       Weight:       161.2 lb Date of Birth:  05-Jan-1946        BSA:          1.885 m Patient Age:    76 years         BP:           126/84 mmHg Patient Gender: M                HR:           90 bpm. Exam Location:  [facility] Procedure: 2D Echo, Cardiac Doppler and Color Doppler Indications:     Atrial Fibrillation  I48.91  History:         Patient has prior history of Echocardiogram examinations, most                  recent 12/25/2020. CAD; Risk Factors:Hypertension.  Sonographer:     Sherrie Sport Referring Phys:  [id number] Sharen Hones Diagnosing Phys: Isaias Cowman MD  Sonographer Comments: Technically challenging study due to limited acoustic windows, no apical window and no subcostal window. IMPRESSIONS  1. Left ventricular ejection fraction, by estimation, is 60 to 65%. The left ventricle has normal function. The left ventricle has no regional wall motion abnormalities. Left ventricular diastolic parameters are indeterminate.  2. Right ventricular systolic function is normal. The right ventricular size is normal.  3. Large thrombus noted in right atrium.  4. The mitral valve is normal in structure. Mild mitral valve regurgitation. No evidence of mitral stenosis.  5. The aortic valve is normal in structure. Aortic valve regurgitation is not visualized. No aortic  stenosis is present.  6. The inferior vena cava is normal in size with greater than 50% respiratory variability, suggesting right atrial pressure of 3 mmHg. FINDINGS  Left Ventricle: RA thrombus noted. Left ventricular ejection fraction, by estimation, is 60 to 65%. The left ventricle has normal function. The left ventricle has no regional wall motion abnormalities. The left ventricular internal cavity size was normal in size. There is no left ventricular hypertrophy. Left ventricular diastolic parameters are indeterminate. Right Ventricle: The right ventricular size is normal. No increase in right ventricular wall thickness. Right ventricular systolic function is normal. Left Atrium: Left atrial size was normal in size. Right Atrium: Large thrombus noted in right atrium. Right atrial size was normal in size. Pericardium: There is no evidence of pericardial effusion. Mitral Valve: The mitral valve is normal in structure. Mild mitral valve regurgitation. No evidence of mitral valve stenosis. Tricuspid Valve: The tricuspid valve is normal in structure. Tricuspid valve regurgitation is mild . No evidence of tricuspid stenosis. Aortic Valve: The aortic valve is normal in structure. Aortic valve regurgitation is not visualized. No aortic stenosis is present. Pulmonic Valve: The pulmonic valve was normal in structure. Pulmonic valve regurgitation is not visualized. No evidence of pulmonic stenosis. Aorta: The aortic root is normal in size and structure. Venous: The inferior vena cava is normal in size with greater than 50% respiratory variability, suggesting right atrial pressure of 3 mmHg. IAS/Shunts: No atrial level shunt detected by color flow Doppler.  LEFT VENTRICLE PLAX 2D LVIDd:         3.95 cm LVIDs:         2.60 cm LV PW:         1.30 cm LV IVS:        1.40 cm  Isaias Cowman MD Electronically signed by Isaias Cowman MD Signature Date/Time: 03/18/2022/1:00:12 PM    Final (Updated)    CT Angio Chest  Pulmonary Embolism (PE) W or WO Contrast  Addendum Date: 03/17/2022   ADDENDUM REPORT: 03/17/2022 20:06 ADDENDUM: These results were called by telephone at the time of interpretation on 03/17/2022 at 8:06 pm to provider NP BRENDA MORRRISON, who verbally acknowledged these results. Electronically Signed   By: Fidela Salisbury M.D.   On: 03/17/2022 20:06   Result Date: 03/17/2022 CLINICAL DATA:  Shortness of breath. EXAM: CT ANGIOGRAPHY CHEST WITH CONTRAST TECHNIQUE: Multidetector CT imaging of the chest was performed using the standard protocol during bolus administration of intravenous contrast. Multiplanar CT image reconstructions and MIPs  were obtained to evaluate the vascular anatomy. RADIATION DOSE REDUCTION: This exam was performed according to the departmental dose-optimization program which includes automated exposure control, adjustment of the mA and/or kV according to patient size and/or use of iterative reconstruction technique. CONTRAST:  80m OMNIPAQUE IOHEXOL 350 MG/ML SOLN COMPARISON:  Feb 11, 2022 FINDINGS: Cardiovascular: Satisfactory opacification of the pulmonary arteries to the segmental level. Bilateral central pulmonary emboli involving the main pulmonary arteries and extending to wall lobar branches with nearly occlusive clot to the right lower lobe, left upper lobe and several segmental branches. RV/LV ratio of 1.6, significant for right heart strain. Mediastinum/Nodes: No enlarged mediastinal, hilar, or axillary lymph nodes. Thyroid gland, trachea, and esophagus demonstrate no significant findings. Lungs/Pleura: Scattered areas of ground-glass opacities throughout the lung parenchyma likely representing hypoperfusion. Atelectasis versus developing pulmonary infarcts in the bilateral lower lobes dependently. 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 Musculoskeletal: Sclerotic focus within T4, minimally enlarged since July  2022. Review of the MIP images confirms the above findings. IMPRESSION: 1. Bilateral central pulmonary emboli involving the main pulmonary arteries and extending to all lobar branches with nearly occlusive clot to the right lower lobe, left upper lobe and several segmental branches. 2. Positive for acute PE with CT evidence of right heart strain (RV/LV Ratio = 1.6) consistent with at least submassive (intermediate risk) PE. The presence of right heart strain has been associated with an increased risk of morbidity and mortality. 3. Scattered areas of ground-glass opacities throughout the lung parenchyma likely representing hypoperfusion. 4. Atelectasis versus developing pulmonary infarcts in the bilateral lower lobes dependently. 5. Mildly increased sclerotic focus within T4 vertebral body. Please correlate clinically. Electronically Signed: By: DFidela SalisburyM.D. On: 03/17/2022 19:40     Echo LVEF 60-65%, thrombus noted in right atrium and right ventricle  TELEMETRY: Sinus rhythm at 87 bpm:  ASSESSMENT AND PLAN:  Principal Problem:   Arterial occlusion Active Problems:   Bilateral pulmonary embolism (HCC)   HTN (hypertension)   History of prostate cancer   Coronary artery disease   AKI (acute kidney injury) ([place]   Critical limb ischemia of left lower extremity with ulceration of lower leg (HCC)   Acute respiratory failure with hypoxia (HCC)   Acute on chronic diastolic CHF (congestive heart failure) (HCC)   PSVT (paroxysmal supraventricular tachycardia) ([place]    1.  Paroxysmal SVT, in the setting of recent left BKA on 03/16/2022, and bilateral pulmonary emboli status post mechanical thrombectomy 03/18/2022, currently in sinus rhythm, on metoprolol to tartrate 2.  Bilateral pulmonary emboli, status post mechanical thrombectomy 03/18/2022 3.  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  Elevated troponin, likely demand supply ischemia, secondary to submassive bilateral pulmonary emboli 5.   CAD, history of NSTEMI status post DES proximal LAD 12/2020  Recommendations  1.  Agree with current therapy 2.  Continue heparin infusion with plan to transition back to Eliquis 3.  Continue metoprolol tartrate at current dose 4.  Continue clopidogrel 5.  Continue pravastatin   AIsaias Cowman MD, PhD, [facility]7/09/2021 9:19 AM

## 2022-03-19 NOTE — Evaluation (Signed)
Occupational Therapy Evaluation Patient Details Name: Phillip [name] MRN: [id number] DOB: March 08, 1946 Today's Date: 03/19/2022   History of Present Illness Pt is a 76 y.o. male with medical history significant for recurrent DVTs, post operative PE on Eliquis, HTN, HLD, nephrolithiasis, malignant neoplasm of prostate post resection, OA, CAD status post PCI with stenting, CHF, who presented to [facility] ED from his PCP due to concern for critical left lower limb ischemia.  The patient had an artificial urinary sphincter placed 03/09/2022.  His Eliquis was held a week prior to the procedure.  1 to 2 days after being off Eliquis he started having pain in his left lower extremity.  His left lower extremity pain became progressively worse with his skin turning cold and blue and was associated with inability to bear weight on his left lower extremity.  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  Pt then developed bilateral PE and is s/p mechanical thrombectomy and thrombolysis 6/30 with new orders received to continue with OT   Clinical Impression   Chart reviewed, pt cleared by RN to participate in OT re-evaluation. Pt greeted in chair agreeable to re-eval. MIN A required for UB dressing, SET UP for grooming tasks. Pt performed STS with MIN A with RW. HR below 100, spo2 >90% on 4 L via [place] throughout. After second standing attempt (and OT request to return to sitting for pacing) pt with c/o SOB with sats down to low 70s, pt up to 15L via [place] per RN request. RN, RT present to assess. Pt assisted back to bed SPT from chair with MIN A with RW, MAX A sit>supine. Pt in care of team with NRB mask on. RN notified MD of pt status while working with OT. Per verbal request from RN, hold on further therapy at this time. Recommend STR when pt is medically ready. OT will continue to follow acutely.      Recommendations for follow up therapy are one component of a multi-disciplinary discharge planning  process, led by the attending physician.  Recommendations may be updated based on patient status, additional functional criteria and insurance authorization.   Follow Up Recommendations  Skilled nursing-short term rehab (<3 hours/day)    Assistance Recommended at Discharge Frequent or constant Supervision/Assistance  Patient can return home with the following Two people to help with walking and/or transfers;A lot of help with bathing/dressing/bathroom;Assistance with cooking/housework;Assist for transportation;Help with stairs or ramp for entrance    Functional Status Assessment  Patient has had a recent decline in their functional status and demonstrates the ability to make significant improvements in function in a reasonable and predictable amount of time.  Equipment Recommendations  BSC/3in1    Recommendations for Other Services       Precautions / Restrictions Precautions Precautions: Fall Restrictions Weight Bearing Restrictions: Yes LLE Weight Bearing: Non weight bearing Other Position/Activity Restrictions: Recent L BKA      Mobility Bed Mobility   Bed Mobility: Sit to Supine       Sit to supine: Max assist        Transfers Overall transfer level: Needs assistance Equipment used: Rolling walker (2 wheels) Transfers: Sit to/from Stand Sit to Stand: Min assist           General transfer comment: STS 3x with MIN A with RW      Balance Overall balance assessment: Needs assistance Sitting-balance support: Single extremity supported Sitting balance-Leahy Scale: Good     Standing balance  support: Reliant on assistive device for balance, Bilateral upper extremity supported, During functional activity Standing balance-Leahy Scale: Poor                             ADL either performed or assessed with clinical judgement   ADL Overall ADL's : Needs assistance/impaired         Upper Body Bathing: Minimal assistance;Sitting       Upper  Body Dressing : Sitting;Minimal assistance                           Vision Patient Visual Report: No change from baseline       Perception     Praxis      Pertinent Vitals/Pain Pain Assessment Pain Assessment: No/denies pain     Hand Dominance Right   Extremity/Trunk Assessment Upper Extremity Assessment Upper Extremity Assessment: Overall WFL for tasks assessed   Lower Extremity Assessment Lower Extremity Assessment: Generalized weakness;LLE deficits/detail LLE Deficits / Details: s/p L BKA   Cervical / Trunk Assessment Cervical / Trunk Assessment: Normal   Communication Communication Communication: No difficulties   Cognition Arousal/Alertness: Awake/alert Behavior During Therapy: WFL for tasks assessed/performed Overall Cognitive Status: Within Functional Limits for tasks assessed                                       General Comments       Exercises     Shoulder Instructions      Home Living Family/patient expects to be discharged to:: Private residence Living Arrangements: Spouse/significant other Available Help at Discharge: Available 24 hours/day Type of Home: Other(Comment) (condo) Home Access: Stairs to enter Entrance Stairs-Number of Steps: 1   Home Layout: Two level;Able to live on main level with bedroom/bathroom         Bathroom Toilet: Handicapped height     Home Equipment: Conservation officer, nature (2 wheels);Cane - single point;Shower seat          Prior Functioning/Environment Prior Level of Function : Independent/Modified Independent             Mobility Comments: indep without AD ADLs Comments: indep in ADL/IADL, works part time at a golf course, retired Production assistant, radio        OT Problem List:        OT Treatment/Interventions: Field seismologist;Therapeutic exercise;Energy conservation;DME and/or AE instruction;Therapeutic activities;Patient/family education;Balance training    OT  Goals(Current goals can be found in the care plan section) Acute Rehab OT Goals Patient Stated Goal: go to rehab OT Goal Formulation: With patient Time For Goal Achievement: 04/02/22 Potential to Achieve Goals: Good  OT Frequency: Min 3X/week    Co-evaluation              AM-PAC OT "6 Clicks" Daily Activity     Outcome Measure Help from another person eating meals?: None Help from another person taking care of personal grooming?: A Little Help from another person toileting, which includes using toliet, bedpan, or urinal?: A Lot Help from another person bathing (including washing, rinsing, drying)?: A Lot Help from another person to put on and taking off regular upper body clothing?: A Little Help from another person to put on and taking off regular lower body clothing?: A Lot 6 Click Score: 16   End of Session Equipment Utilized During Treatment:  Rolling walker (2 wheels);Gait belt Nurse Communication: Mobility status  Activity Tolerance: Patient tolerated treatment well Patient left: in bed;with call bell/phone within reach;with SCD's reapplied;with nursing/sitter in room (RT in room)  OT Visit Diagnosis: Unsteadiness on feet (R26.81);Repeated falls (R29.6);Muscle weakness (generalized) (M62.81)                Time: 1313-1400 OT Time Calculation (min): 47 min Charges:  OT General Charges $OT Visit: 1 Visit OT Evaluation $OT Re-eval: 1 Re-eval  Shanon Payor, OTD OTR/L  03/19/22, 2:12 PM

## 2022-03-19 NOTE — Progress Notes (Signed)
Physical Therapy Treatment Patient Details Name: Phillip [name] MRN: [id number] DOB: [birth year] Today's Date: 03/19/2022   History of Present Illness Pt is a 76 y.o. male with medical history significant for recurrent DVTs, post operative PE on Eliquis, HTN, HLD, nephrolithiasis, malignant neoplasm of prostate post resection, OA, CAD status post PCI with stenting, CHF, who presented to [facility] ED from his PCP due to concern for critical left lower limb ischemia.  The patient had an artificial urinary sphincter placed 03/09/2022.  His Eliquis was held a week prior to the procedure.  1 to 2 days after being off Eliquis he started having pain in his left lower extremity.  His left lower extremity pain became progressively worse with his skin turning cold and blue and was associated with inability to bear weight on his left lower extremity.  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  Pt then developed bilateral PE and is s/p mechanical thrombectomy and thrombolysis 6/30 with new orders received to continue with physical therapy.    PT Comments    Pt was pleasant and motivated to participate during the session and put forth good effort throughout. New PT orders received after pt diagnosed with bilateral PEs and s/p thrombectomy and thrombolysis on 6/30.  Pt's functional mobility somewhat improved since recent PT evaluation with current goals and discharge recommendations remaining appropriate. Pt required no physical assist with sup to sit but did need significant time and use of the bed rails to come to sitting.  Pt was able to stand with min A and then to shuffle his R foot in various directions and ultimately was able to get to the recliner with only CGA.  Pt was somewhat SOB after both sup to sit and transfers/gait with SpO2 and HR WNL on 4L and with SOB resolving quickly once pt was at rest.  Pt will benefit from PT services in a SNF setting upon discharge to safely address deficits  listed in patient problem list for decreased caregiver assistance and eventual return to PLOF.     Recommendations for follow up therapy are one component of a multi-disciplinary discharge planning process, led by the attending physician.  Recommendations may be updated based on patient status, additional functional criteria and insurance authorization.  Follow Up Recommendations  Skilled nursing-short term rehab (<3 hours/day) Can patient physically be transported by private vehicle: No   Assistance Recommended at Discharge Frequent or constant Supervision/Assistance  Patient can return home with the following A lot of help with walking and/or transfers;A lot of help with bathing/dressing/bathroom;Assistance with cooking/housework;Assist for transportation   Equipment Recommendations  Rolling walker (2 wheels);Wheelchair (measurements PT);Wheelchair cushion (measurements PT)    Recommendations for Other Services       Precautions / Restrictions Precautions Precautions: Fall Restrictions Weight Bearing Restrictions: Yes LLE Weight Bearing: Non weight bearing Other Position/Activity Restrictions: Recent L BKA     Mobility  Bed Mobility Overal bed mobility: Modified Independent             General bed mobility comments: Extra time, effort, and use of bed rails only    Transfers Overall transfer level: Needs assistance Equipment used: Rolling walker (2 wheels) Transfers: Sit to/from Stand Sit to Stand: Min assist, From elevated surface           General transfer comment: Min A to come to standing and for stability from an elevated EOB    Ambulation/Gait Ambulation/Gait assistance: Min guard Gait Distance (Feet):  2 Feet Assistive device: Rolling walker (2 wheels) Gait Pattern/deviations: Shuffle Gait velocity: decreased     General Gait Details: Pt able to shuffle left/right and forwards/backwards with significant effort and BUE assist with the walker; HR and  SpO2 WNL on 4L   Stairs             Wheelchair Mobility    Modified Rankin (Stroke Patients Only)       Balance Overall balance assessment: Needs assistance Sitting-balance support: Feet unsupported, Single extremity supported Sitting balance-Leahy Scale: Good     Standing balance support: Reliant on assistive device for balance, Bilateral upper extremity supported, During functional activity Standing balance-Leahy Scale: Poor                              Cognition Arousal/Alertness: Awake/alert Behavior During Therapy: WFL for tasks assessed/performed Overall Cognitive Status: Within Functional Limits for tasks assessed                                          Exercises Total Joint Exercises Quad Sets: AROM, Strengthening, Left, 5 reps, 10 reps Hip ABduction/ADduction: Strengthening, Left, 10 reps (gentle manual resistance) Straight Leg Raises: Strengthening, Left, 10 reps Long Arc Quad: AROM, Strengthening, Left, 5 reps, 10 reps Knee Flexion: AROM, Strengthening, Left, 5 reps, 10 reps Other Exercises Other Exercises: Educated on positioning and ROM to L knee as tolerated with seated knee flex, LAQs, and QS    General Comments        Pertinent Vitals/Pain Pain Assessment Pain Assessment: 0-10 Pain Score: 5  Pain Location: LLE Pain Descriptors / Indicators: Aching, Sore Pain Intervention(s): Repositioned, Premedicated before session, Monitored during session    Home Living                          Prior Function            PT Goals (current goals can now be found in the care plan section) Progress towards PT goals: Progressing toward goals    Frequency    7X/week      PT Plan Current plan remains appropriate    Co-evaluation              AM-PAC PT "6 Clicks" Mobility   Outcome Measure  Help needed turning from your back to your side while in a flat bed without using bedrails?: A Little Help  needed moving from lying on your back to sitting on the side of a flat bed without using bedrails?: A Little Help needed moving to and from a bed to a chair (including a wheelchair)?: A Little Help needed standing up from a chair using your arms (e.g., wheelchair or bedside chair)?: A Little Help needed to walk in hospital room?: Total Help needed climbing 3-5 steps with a railing? : Total 6 Click Score: 14    End of Session Equipment Utilized During Treatment: Gait belt;Oxygen Activity Tolerance: Patient tolerated treatment well Patient left: in chair;with call bell/phone within reach;with family/visitor present Nurse Communication: Mobility status PT Visit Diagnosis: Other abnormalities of gait and mobility (R26.89);Muscle weakness (generalized) (M62.81);Pain Pain - Right/Left: Left Pain - part of body: Leg     Time: 7124-5809 PT Time Calculation (min) (ACUTE ONLY): 34 min  Charges:  $Therapeutic Exercise: 8-22 mins $Therapeutic Activity: 8-22  mins                     D. Royetta Asal PT, DPT 03/19/22, 12:08 PM

## 2022-03-19 NOTE — Op Note (Signed)
    Patient name: Phillip [name] MRN: [id number] DOB: 1946-08-26 Sex: male  03/19/2022 Pre-operative Diagnosis: DVT Post-operative diagnosis:  Same Surgeon:  Annamarie Major Procedure Performed:  1.  U/s guided access, right femoral vein  2.  IVC venogram  3.  Placement of IVC filter    Indications: This is a 76 year old gentleman who underwent PE thrombectomy yesterday.  He had an acute pulmonary decompensation today while on anticoagulation.  The hospital team is worried about recurrent PE in the setting of anticoagulation and would like an IVC filter placed.  Informed consent was obtained via the son and the patient.  Procedure:  The patient was identified in the holding area and taken to the specials procedure room.  A timeout was called.  Ultrasound was used to evaluate the right common femoral vein which was widely patent and easily compressible.  1% lidocaine was used for local anesthesia.  The right common femoral vein was then cannulated under ultrasound guidance with a micropuncture needle.  A 018 wire was advanced without resistance followed placement of micropuncture sheath.  Next, a 035 wire was inserted and a 5 French sheath was placed.  A pigtail catheter was positioned in the right common iliac vein and a IVC venogram was performed which identified the location of the renal veins.  The IVC was of normal diameter without any anatomic variance.  A Amplatz wire was then inserted.  The filter introducing sheath was then placed up to the level of the renal veins.  A argon filter was then loaded into the sheath and then successfully deployed landing at the level of the renal veins.  The filter was in good position without any tilt.  The sheath was then removed and manual pressure was held for hemostasis.  There were no immediate complications.    Impression:  #1  Successful placement of removable infrarenal IVC filter  #2  Normal venacavogram   V. Annamarie Major, M.D., [facility] [place] Office: [phone] Pager:  [phone]

## 2022-03-19 NOTE — Progress Notes (Signed)
Subjective  - POD #3, s/p Left BKA, POD#!, s/p pulmonary thrombectomy  Breathing is better this am   Physical Exam:  Dressing to BKA changed, stump looks good Right groin soft       Assessment/Plan:  POD #31,  Stable from vascular standpoint Transition to Eliquis Will follow up in office in 4 weeks for staple removal Please call for additional issues CIR eval  Phillip [name] 03/19/2022 12:11 PM --  Vitals:   03/19/22 1000 03/19/22 1100  BP: 127/65 (!) 115/59  Pulse: 95 95  Resp: 18 (!) 22  Temp:    SpO2: 97% 98%    Intake/Output Summary (Last 24 hours) at 03/19/2022 1211 Last data filed at 03/19/2022 0900 Gross per 24 hour  Intake 703.54 ml  Output 950 ml  Net -246.46 ml     Laboratory CBC    Component Value Date/Time   WBC 15.3 (H) 03/19/2022 0453   HGB 8.7 (L) 03/19/2022 0453   HGB 12.7 (L) 10/25/2014 2206   HCT 28.1 (L) 03/19/2022 0453   HCT 40.9 10/25/2014 2206   PLT 395 03/19/2022 0453   PLT 188 10/25/2014 2206    BMET    Component Value Date/Time   NA 135 03/18/2022 0519   NA 141 10/25/2014 2206   K 4.7 03/18/2022 0519   K 4.0 10/25/2014 2206   CL 102 03/18/2022 0519   CL 109 (H) 10/25/2014 2206   CO2 24 03/18/2022 0519   CO2 26 10/25/2014 2206   GLUCOSE 183 (H) 03/18/2022 0519   GLUCOSE 210 (H) 10/25/2014 2206   BUN 34 (H) 03/18/2022 0519   BUN 21 08/15/2016 1618   BUN 15 10/25/2014 2206   CREATININE 1.57 (H) 03/18/2022 0519   CREATININE 1.32 (H) 10/25/2014 2206   CALCIUM 8.5 (L) 03/18/2022 0519   CALCIUM 8.2 (L) 10/25/2014 2206   GFRNONAA 46 (L) 03/18/2022 0519   GFRNONAA 58 (L) 10/25/2014 2206   GFRAA >60 03/27/2019 0637   GFRAA >60 10/25/2014 2206    COAG Lab Results  Component Value Date   INR 1.2 03/11/2022   INR 1.4 (H) 11/21/2021   INR 2.1 (H) 03/26/2021   No results found for: "PTT"  Antibiotics Anti-infectives (From admission, onward)    Start     Dose/Rate Route Frequency Ordered Stop   03/18/22 0943   ceFAZolin (ANCEF) IVPB 2g/100 mL premix        2 g 200 mL/hr over 30 Minutes Intravenous 30 min pre-op 03/18/22 2130 03/18/22 1349   03/17/22 2200  ceFAZolin (ANCEF) IVPB 1 g/50 mL premix       Note to Pharmacy: Send with pt to OR   1 g 100 mL/hr over 30 Minutes Intravenous Every 8 hours 03/17/22 1718 03/18/22 0547   03/16/22 1508  ceFAZolin (ANCEF) 2-4 GM/100ML-% IVPB       Note to Pharmacy: Trudie Reed S: cabinet override      03/16/22 1508 03/16/22 1554   03/16/22 0600  ceFAZolin (ANCEF) IVPB 2g/100 mL premix        2 g 200 mL/hr over 30 Minutes Intravenous On call to O.R. 03/15/22 1315 03/16/22 1544   03/13/22 0600  ceFAZolin (ANCEF) IVPB 2g/100 mL premix       Note to Pharmacy: Send with pt to OR   2 g 200 mL/hr over 30 Minutes Intravenous On call 03/12/22 1948 03/12/22 2017   03/13/22 0400  ceFAZolin (ANCEF) IVPB 2g/100 mL premix  2 g 200 mL/hr over 30 Minutes Intravenous Every 8 hours 03/13/22 0048 03/13/22 1257   03/12/22 0600  ceFAZolin (ANCEF) IVPB 2g/100 mL premix        2 g 200 mL/hr over 30 Minutes Intravenous On call to O.R. 03/11/22 1525 03/11/22 1705   03/11/22 1547  ceFAZolin (ANCEF) 2-4 GM/100ML-% IVPB       Note to Pharmacy: Theadora Rama : cabinet override      03/11/22 1547 03/11/22 1635        V. Leia Alf, M.D., [facility] [place] Office: [phone] Pager:  [phone]

## 2022-03-19 NOTE — Progress Notes (Signed)
[place] for heparin infusion initiation and monitoring Indication: pulmonary embolus  Allergies  Allergen Reactions   Doxazosin Other (See Comments)    Other reaction(s): Unknown    Patient Measurements: Height: '5\' 9"'$  (175.3 cm) Weight: 73.1 kg (161 lb 2.5 oz) IBW/kg (Calculated) : 70.7 Heparin Dosing Weight: 79.3 kg  Vital Signs: Temp: 98.9 F (37.2 C) (07/01 1200) Temp Source: Oral (07/01 1200) BP: 129/65 (07/01 1200) Pulse Rate: 89 (07/01 1200)  Labs: Recent Labs    03/16/22 1448 03/17/22 0604 03/17/22 0604 03/18/22 0519 03/18/22 0939 03/18/22 2209 03/19/22 0453 03/19/22 1312  HGB  --  11.6*   < > 10.5*  --   --  8.7*  --   HCT  --  37.2*  --  34.4*  --   --  28.1*  --   PLT  --  396  --  335  --   --  395  --   APTT  --   --    < > 149*  --  99* 73* 64*  HEPARINUNFRC  --   --   --   --   --   --  0.48 0.36  CREATININE  --  1.20  --  1.57*  --   --   --   --   TROPONINIHS 311*  --   --  2,277* 1,819*  --   --   --    < > = values in this interval not displayed.     Estimated Creatinine Clearance: 40.7 mL/min (A) (by C-G formula based on SCr of 1.57 mg/dL (H)).   Medical History: Past Medical History:  Diagnosis Date   Arthritis    Basal cell carcinoma    L clavicle, L prox forearm- removed years ago    Bladder cancer (HCC)    Bladder neck contracture    CAD (coronary artery disease)    a. 12/2020 NSTEMI/Cath: LM nl, LAD 95ost/p, LCX 50p, RCA nl. EF 45-50%.   Cataract    CKD (chronic kidney disease), stage III [facility])    ED (erectile dysfunction)    Frequency    GERD (gastroesophageal reflux disease)    History of pulmonary embolus (PE) 11/2018   a. Following LE DVT-->Chronic warfarin.   Hyperlipidemia LDL goal <70    Hypertension    Hypothyroidism    Incontinence of urine    sui, s/p cryoablation   Ischemic cardiomyopathy    a. 11/2018 Echo: EF 60-65%; b. 12/2020 LV Gram: EF 45-50% in setting of NSTEMI.    Kidney stones    Neuropathy    feet   Nocturia    Prostate cancer (HCC)    S/P   CRYOABLATION   Right elbow tendinitis    Right Lower Extremity DVT (deep venous thrombosis) (HCC) 11/25/2018   Vertigo    1-2x/yr   Wears glasses     Medications:  Scheduled:   Chlorhexidine Gluconate Cloth  6 each Topical Daily   clopidogrel  75 mg Oral Daily   docusate sodium  100 mg Oral BID   insulin aspart  0-15 Units Subcutaneous TID WC   lactulose  20 g Oral BID   levothyroxine  50 mcg Oral Q0600   melatonin  5 mg Oral QHS   metoprolol tartrate  12.5 mg Oral BID   pantoprazole  40 mg Oral Daily   polyethylene glycol  17 g Oral BID   pravastatin  40 mg Oral Daily  senna-docusate  2 tablet Oral BID   sodium chloride flush  3 mL Intravenous Q12H    Assessment: 76 y.o. 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 Patient recently having discontinued this about 1 week ago in preparation for placement of an artificial urinary sphincter procedure on on 03/09/22 and was instructed to continue to hold Eliquis 48 hours post-op. Presents to ED on 03/11/22 with pain and duskiness in the left foot extending towards the left calf. Pharmacy has been consulted for heparin infusion for VTE.   S/P thrombectomy 6/23, additional thromboembolectomy on 6/24 PM.  Pt was on heparin gtt since 6/24 @ ~ 2000. Pt was on Eliquis 10 mg PO BID. On 6/26 he was transitioned to aggrastat drip x 18 hrs with the hope for improving his forefoot perfusion, which was completed then transitioned to apixaban with last dose administered 0904 03/17/22 now with a new PE   Goal of Therapy:  Heparin level 0.3-0.7 units/ml aPTT 66 - 102 seconds Monitor platelets by anticoagulation protocol: Yes  6/30 0519 aPTT 149, supratherapeutic 6/30 2209 aPTT 99, supratherapeutic 7/01 0453 aPTT 73, HL 0.48, therapeutic 7/01 1312 aPTT 64, HL 0.36- aPTT subtherapeutic, not correlating   Plan:  7/01 1312 aPTT 64, HL 0.36-  barely subtherapeutic increase  Heparin drip to 900 units/hr  Recheck aPTT in 8 hrs, f/u for HL correlation CBC daily while on heparin  Chinita Greenland PharmD Clinical Pharmacist 03/19/2022

## 2022-03-19 NOTE — Progress Notes (Signed)
Pt and family made decision to change code status to DNR/DNI, MD notified he spoke to pt over the phone and confirmed. Updated code status.

## 2022-03-19 NOTE — Plan of Care (Signed)

## 2022-03-19 NOTE — Progress Notes (Signed)
[place] for heparin infusion initiation and monitoring Indication: pulmonary embolus  Allergies  Allergen Reactions   Doxazosin Other (See Comments)    Other reaction(s): Unknown    Patient Measurements: Height: '5\' 9"'$  (175.3 cm) Weight: 73.1 kg (161 lb 2.5 oz) IBW/kg (Calculated) : 70.7 Heparin Dosing Weight: 79.3 kg  Vital Signs: Temp: 98.2 F (36.8 C) (07/01 0600) Temp Source: Axillary (07/01 0600) BP: 109/67 (07/01 0600) Pulse Rate: 79 (07/01 0600)  Labs: Recent Labs    03/16/22 0656 03/16/22 1448 03/17/22 0604 03/17/22 0604 03/18/22 0519 03/18/22 0939 03/18/22 2209 03/19/22 0453  HGB  --   --  11.6*   < > 10.5*  --   --  8.7*  HCT  --   --  37.2*  --  34.4*  --   --  28.1*  PLT  --   --  396  --  335  --   --  395  APTT 84*  --   --   --  149*  --  99* 73*  HEPARINUNFRC 0.46  --   --   --   --   --   --  0.48  CREATININE  --   --  1.20  --  1.57*  --   --   --   TROPONINIHS  --  311*  --   --  2,277* 1,819*  --   --    < > = values in this interval not displayed.     Estimated Creatinine Clearance: 40.7 mL/min (A) (by C-G formula based on SCr of 1.57 mg/dL (H)).   Medical History: Past Medical History:  Diagnosis Date   Arthritis    Basal cell carcinoma    L clavicle, L prox forearm- removed years ago    Bladder cancer (HCC)    Bladder neck contracture    CAD (coronary artery disease)    a. 12/2020 NSTEMI/Cath: LM nl, LAD 95ost/p, LCX 50p, RCA nl. EF 45-50%.   Cataract    CKD (chronic kidney disease), stage III [facility])    ED (erectile dysfunction)    Frequency    GERD (gastroesophageal reflux disease)    History of pulmonary embolus (PE) 11/2018   a. Following LE DVT-->Chronic warfarin.   Hyperlipidemia LDL goal <70    Hypertension    Hypothyroidism    Incontinence of urine    sui, s/p cryoablation   Ischemic cardiomyopathy    a. 11/2018 Echo: EF 60-65%; b. 12/2020 LV Gram: EF 45-50% in setting of NSTEMI.    Kidney stones    Neuropathy    feet   Nocturia    Prostate cancer (HCC)    S/P   CRYOABLATION   Right elbow tendinitis    Right Lower Extremity DVT (deep venous thrombosis) (HCC) 11/25/2018   Vertigo    1-2x/yr   Wears glasses     Medications:  Scheduled:   Chlorhexidine Gluconate Cloth  6 each Topical Daily   clopidogrel  75 mg Oral Daily   docusate sodium  100 mg Oral BID   insulin aspart  0-15 Units Subcutaneous TID WC   lactulose  20 g Oral BID   levothyroxine  50 mcg Oral Q0600   melatonin  5 mg Oral QHS   metoprolol tartrate  12.5 mg Oral BID   pantoprazole  40 mg Oral Daily   pravastatin  40 mg Oral Daily   senna-docusate  2 tablet Oral BID   sodium chloride  flush  3 mL Intravenous Q12H    Assessment: 76 y.o. 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 Patient recently having discontinued this about 1 week ago in preparation for placement of an artificial urinary sphincter procedure on on 03/09/22 and was instructed to continue to hold Eliquis 48 hours post-op. Presents to ED on 03/11/22 with pain and duskiness in the left foot extending towards the left calf. Pharmacy has been consulted for heparin infusion for VTE.   S/P thrombectomy 6/23, additional thromboembolectomy on 6/24 PM.  Pt was on heparin gtt since 6/24 @ ~ 2000. Pt was on Eliquis 10 mg PO BID. On 6/26 he was transitioned to aggrastat drip x 18 hrs with the hope for improving his forefoot perfusion, which was completed then transitioned to apixaban with last dose administered 0904 03/17/22 now with a new PE   Goal of Therapy:  Heparin level 0.3-0.7 units/ml aPTT 66 - 102 seconds Monitor platelets by anticoagulation protocol: Yes  6/30 0519 aPTT 149, supratherapeutic 6/30 2209 aPTT 99, supratherapeutic 7/01 0453 aPTT 73, HL 0.48, therapeutic   Plan:  Continue heparin infusion at 850 units/hr Recheck aPTT and HL in 8 to confirm, then follow HL CBC daily while on heparin  Renda Rolls, PharmD, [facility] 03/19/2022 6:23 AM

## 2022-03-19 NOTE — Progress Notes (Addendum)
Progress Note   Patient: Phillip [name] DOB: [birth year] DOA: 03/11/2022     8 DOS: the patient was seen and examined on 03/19/2022   Brief hospital course: Phillip [name] is a 76 y.o. male with medical history significant for recurrent DVTs, post operative PE on Eliquis, unspecified thrombophilia, hypertension, hyperlipidemia, nephrolithiasis, malignant neoplasm of prostate post resection, bladder cancer, osteoarthritis, coronary artery disease status post PCI with stenting on Plavix, chronic diastolic CHF, who presents to [facility] ED from his PCP's office, Dr. Sabra Heck, due to concern for left critical lower limb ischemia.   Patient is seen by vascular surgery, had a lower extremity bypass surgery on 6/23.  Went back to the OR on 6/24 for L LE thrombectomy.  Patient also placed on heparin drip.  It appears that the patient left leg is nonsalvageable.  BKA scheduled. 6/28.  Developed episode of PSVT, hypoxia.  Appears to be flash pulm edema, given IV Lasix. BKA performed on 6/28. 6/30.  Patient developed hypoxia and tachycardia again with atrial fibrillation.  CT angiogram was performed 6/29 showed bilateral PE.  Thrombectomy performed on 6/30, heparin drip restarted.  Assessment and Plan: Acute hypoxemic respiratory failure Bilateral pulmonary emboli. Flash pulmonary edema secondary to acute on chronic diastolic congestive heart failure. Paroxysmal A fib Patient is status post pulmonary thrombectomy.  Continue heparin drip for another day.  May consider transition to high-dose Eliquis tomorrow. Currently patient still on oxygen, but feels better, will gradually wean off oxygen. Due to large burden of PE, I will obtain right leg duplex ultrasound to see if patient still has significant DVT.   Constipation. Patient had a bowel movement after giving water enema, will start lactulose again for 2 doses.   Elevated troponin Appreciate cardiology consult.  Appear to be secondary to  large PE with heart strain   Left lower extremity ischemia.  Status post BKA Failed effort with revascularization and thrombectomy. Patient had a successful BKA.  Continue pain control. PT/OT.   History of bladder cancer s/p resection. History of prostate cancer. Urinary stress incontinence.  Status post artificial urinary sphincter on 6/21. Patient doing well.     Acute kidney injury. Condition improved.  Coronary artery disease status post PCI and stenting. Continue Plavix, statin.  History of DVT and PE. Reactive thrombocytosis. Acquired hemophilia. Continue heparin drip.  Consider change to Eliquis tomorrow.  Essential hypertension. Continue metoprolol.     1448. Patient had another episode of severe short of breath and hypoxemia after OT standing him up.  Oxygens saturation dropped down to 70%.  But did not develop tachycardia.  Patient was put on 100% on breather.  Stat chest x-ray did not show significant changes. I examined the patient again, lungs have some more mild crackles. Is possible patient has developed another episode of PE while on heparin drip.  Another possibility is a flash pulmonary edema, patient is given 40 mg IV Lasix. Patient is still pending bilateral lower extremity duplex ultrasound to rule out DVT.  If this is still present, may need IVC filter. I will keep patient in the stepdown unit for now.  1539. Reviewed duplex ultrasound, extensive DVT. Patient had another episode of PE while on heparin drip. Notified vascular surgery, patient needs IVC filter.     Subjective:  Patient feels much better.  Still on 4 L oxygen, but no shortness of breath. Still feel constipated, but had a bowel movement after enema.  Physical Exam: Vitals:   03/19/22 0900 03/19/22 1000  03/19/22 1100 03/19/22 1200  BP: 125/78 127/65 (!) 115/59 129/65  Pulse: 87 95 95 89  Resp: 20 18 (!) 22   Temp:    98.9 F (37.2 C)  TempSrc:    Oral  SpO2: 96% 97% 98% 98%   Weight:      Height:       General exam: Appears calm and comfortable  Respiratory system: Clear to auscultation. Respiratory effort normal. Cardiovascular system: S1 & S2 heard, RRR. No JVD, murmurs, rubs, gallops or clicks. No pedal edema. Gastrointestinal system: Abdomen is nondistended, soft and nontender. No organomegaly or masses felt. Normal bowel sounds heard. Central nervous system: Alert and oriented. No focal neurological deficits. Extremities: Left BKA Skin: No rashes, lesions or ulcers Psychiatry: Judgement and insight appear normal. Mood & affect appropriate.   Data Reviewed:  Lab results reviewed  Family Communication: Son updated at bedside.  Disposition: Status is: Inpatient Remains inpatient appropriate because: Severity of disease, IV treatment.  Planned Discharge Destination: Skilled nursing facility    Time spent: 55 minutes  Author: Sharen Hones, MD 03/19/2022 12:38 PM  For on call review [email or web address].

## 2022-03-19 NOTE — Progress Notes (Signed)
Pt was working with OT and when OT sat him down he became very hypoxic, with increased work of breathing sats were in the 70's, increased pts o2 to 15L with improvement into the low 80's, RT came in and put pt on 100% non rebreather, got pt back in bed as well as gave prn dilaudid for increased work of breathing, notified MD, stat cxr and 40 of lasix ordered. Pts o2 sats slowly came back up. Will sat in the mid 90's but with any movement and/or periodically pt will drop down into low 80's. Ultrasound came and did bilateral lower extremity u/s. MD ordered IVC filter be placed. Pt signed consent ( in chart)and explained procedure by MD. Will continue to monitor.

## 2022-03-19 NOTE — Progress Notes (Signed)
[place] for heparin infusion initiation and monitoring Indication: pulmonary embolus  Allergies  Allergen Reactions   Doxazosin Other (See Comments)    Other reaction(s): Unknown    Patient Measurements: Height: '5\' 9"'$  (175.3 cm) Weight: 73.1 kg (161 lb 2.5 oz) IBW/kg (Calculated) : 70.7 Heparin Dosing Weight: 79.3 kg  Vital Signs: Temp: 98.2 F (36.8 C) (07/01 2000) Temp Source: Axillary (07/01 2000) BP: 139/74 (07/01 1900) Pulse Rate: 102 (07/01 2038)  Labs: Recent Labs    03/17/22 0604 03/18/22 0519 03/18/22 2993 03/18/22 2209 03/19/22 0453 03/19/22 1312 03/19/22 2202  HGB 11.6* 10.5*  --   --  8.7*  --   --   HCT 37.2* 34.4*  --   --  28.1*  --   --   PLT 396 335  --   --  395  --   --   APTT  --  149*  --    < > 73* 64* 40*  HEPARINUNFRC  --   --   --   --  0.48 0.36  --   CREATININE 1.20 1.57*  --   --   --   --   --   TROPONINIHS  --  2,277* 1,819*  --   --   --   --    < > = values in this interval not displayed.     Estimated Creatinine Clearance: 40.7 mL/min (A) (by C-G formula based on SCr of 1.57 mg/dL (H)).   Medical History: Past Medical History:  Diagnosis Date   Arthritis    Basal cell carcinoma    L clavicle, L prox forearm- removed years ago    Bladder cancer (HCC)    Bladder neck contracture    CAD (coronary artery disease)    a. 12/2020 NSTEMI/Cath: LM nl, LAD 95ost/p, LCX 50p, RCA nl. EF 45-50%.   Cataract    CKD (chronic kidney disease), stage III [facility])    ED (erectile dysfunction)    Frequency    GERD (gastroesophageal reflux disease)    History of pulmonary embolus (PE) 11/2018   a. Following LE DVT-->Chronic warfarin.   Hyperlipidemia LDL goal <70    Hypertension    Hypothyroidism    Incontinence of urine    sui, s/p cryoablation   Ischemic cardiomyopathy    a. 11/2018 Echo: EF 60-65%; b. 12/2020 LV Gram: EF 45-50% in setting of NSTEMI.   Kidney stones    Neuropathy    feet    Nocturia    Prostate cancer (HCC)    S/P   CRYOABLATION   Right elbow tendinitis    Right Lower Extremity DVT (deep venous thrombosis) (HCC) 11/25/2018   Vertigo    1-2x/yr   Wears glasses     Medications:  Scheduled:   Chlorhexidine Gluconate Cloth  6 each Topical Daily   clopidogrel  75 mg Oral Daily   docusate sodium  100 mg Oral BID   insulin aspart  0-15 Units Subcutaneous TID WC   lactulose  20 g Oral BID   levothyroxine  50 mcg Oral Q0600   melatonin  5 mg Oral QHS   metoprolol tartrate  12.5 mg Oral BID   pantoprazole  40 mg Oral Daily   polyethylene glycol  17 g Oral BID   pravastatin  40 mg Oral Daily   senna-docusate  2 tablet Oral BID   sodium chloride flush  3 mL Intravenous Q12H    Assessment: 76 y.o.  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 Patient recently having discontinued this about 1 week ago in preparation for placement of an artificial urinary sphincter procedure on on 03/09/22 and was instructed to continue to hold Eliquis 48 hours post-op. Presents to ED on 03/11/22 with pain and duskiness in the left foot extending towards the left calf. Pharmacy has been consulted for heparin infusion for VTE.   S/P thrombectomy 6/23, additional thromboembolectomy on 6/24 PM.  Pt was on heparin gtt since 6/24 @ ~ 2000. Pt was on Eliquis 10 mg PO BID. On 6/26 he was transitioned to aggrastat drip x 18 hrs with the hope for improving his forefoot perfusion, which was completed then transitioned to apixaban with last dose administered 0904 03/17/22 now with a new PE   Goal of Therapy:  Heparin level 0.3-0.7 units/ml aPTT 66 - 102 seconds Monitor platelets by anticoagulation protocol: Yes  6/30 0519 aPTT 149, supratherapeutic 6/30 2209 aPTT 99, supratherapeutic 7/01 0453 aPTT 73, HL 0.48, therapeutic 7/01 1312 aPTT 64, HL 0.36- aPTT subtherapeutic, not correlating 7/01 2202 aPTT 40, subtherapeutic   Plan:  B[place] 2400 units x 1 Increase   heparin drip to 1100 units/hr  Recheck aPTT in 8 hrs, f/u for HL correlation CBC daily while on heparin  Renda Rolls, PharmD, [facility] 03/19/2022 11:24 PM

## 2022-03-20 DIAGNOSIS — I5033 Acute on chronic diastolic (congestive) heart failure: Secondary | ICD-10-CM | POA: Diagnosis not present

## 2022-03-20 DIAGNOSIS — I480 Paroxysmal atrial fibrillation: Secondary | ICD-10-CM

## 2022-03-20 DIAGNOSIS — I82413 Acute embolism and thrombosis of femoral vein, bilateral: Secondary | ICD-10-CM

## 2022-03-20 DIAGNOSIS — I7090 Unspecified atherosclerosis: Secondary | ICD-10-CM | POA: Diagnosis not present

## 2022-03-20 DIAGNOSIS — I2699 Other pulmonary embolism without acute cor pulmonale: Secondary | ICD-10-CM | POA: Diagnosis not present

## 2022-03-20 LAB — BASIC METABOLIC PANEL
Anion gap: 8 (ref 5–15)
BUN: 28 mg/dL — ABNORMAL HIGH (ref 8–23)
CO2: 24 mmol/L (ref 22–32)
Calcium: 8.3 mg/dL — ABNORMAL LOW (ref 8.9–10.3)
Chloride: 105 mmol/L (ref 98–111)
Creatinine, Ser: 1.35 mg/dL — ABNORMAL HIGH (ref 0.61–1.24)
GFR, Estimated: 55 mL/min — ABNORMAL LOW (ref >60)
Glucose, Bld: 127 mg/dL — ABNORMAL HIGH (ref 70–99)
Potassium: 4.3 mmol/L (ref 3.5–5.1)
Sodium: 137 mmol/L (ref 135–145)

## 2022-03-20 LAB — FERRITIN: Ferritin: 66 ng/mL (ref 24–336)

## 2022-03-20 LAB — APTT
aPTT: 102 seconds — ABNORMAL HIGH (ref 24–36)
aPTT: 103 seconds — ABNORMAL HIGH (ref 24–36)

## 2022-03-20 LAB — HEPARIN LEVEL (UNFRACTIONATED): Heparin Unfractionated: 0.41 IU/mL (ref 0.30–0.70)

## 2022-03-20 LAB — CBC
HCT: 29.6 % — ABNORMAL LOW (ref 39.0–52.0)
Hemoglobin: 9.3 g/dL — ABNORMAL LOW (ref 13.0–17.0)
MCH: 27.6 pg (ref 26.0–34.0)
MCHC: 31.4 g/dL (ref 30.0–36.0)
MCV: 87.8 fL (ref 80.0–100.0)
Platelets: 581 10*3/uL — ABNORMAL HIGH (ref 150–400)
RBC: 3.37 MIL/uL — ABNORMAL LOW (ref 4.22–5.81)
RDW: 16.5 % — ABNORMAL HIGH (ref 11.5–15.5)
WBC: 15.3 10*3/uL — ABNORMAL HIGH (ref 4.0–10.5)
nRBC: 0.1 % (ref 0.0–0.2)

## 2022-03-20 LAB — GLUCOSE, CAPILLARY
Glucose-Capillary: 120 mg/dL — ABNORMAL HIGH (ref 70–99)
Glucose-Capillary: 140 mg/dL — ABNORMAL HIGH (ref 70–99)
Glucose-Capillary: 164 mg/dL — ABNORMAL HIGH (ref 70–99)
Glucose-Capillary: 171 mg/dL — ABNORMAL HIGH (ref 70–99)

## 2022-03-20 LAB — IRON AND TIBC
Iron: 22 ug/dL — ABNORMAL LOW (ref 45–182)
Saturation Ratios: 12 % — ABNORMAL LOW (ref 17.9–39.5)
TIBC: 192 ug/dL — ABNORMAL LOW (ref 250–450)
UIBC: 170 ug/dL

## 2022-03-20 LAB — VITAMIN B12: Vitamin B-12: 431 pg/mL (ref 180–914)

## 2022-03-20 LAB — MAGNESIUM: Magnesium: 2.4 mg/dL (ref 1.7–2.4)

## 2022-03-20 MED ORDER — POLYSACCHARIDE IRON COMPLEX 150 MG PO CAPS
150.0000 mg | ORAL_CAPSULE | Freq: Every day | ORAL | Status: DC
  Administered 2022-03-20 – 2022-03-28 (×9): 150 mg via ORAL
  Filled 2022-03-20 (×9): qty 1

## 2022-03-20 NOTE — Progress Notes (Signed)
[place] for heparin infusion initiation and monitoring Indication: pulmonary embolus  Allergies  Allergen Reactions   Doxazosin Other (See Comments)    Other reaction(s): Unknown    Patient Measurements: Height: '5\' 9"'$  (175.3 cm) Weight: 73.1 kg (161 lb 2.5 oz) IBW/kg (Calculated) : 70.7 Heparin Dosing Weight: 79.3 kg  Vital Signs: Temp: 99 F (37.2 C) (07/02 0200) Temp Source: Axillary (07/02 0200) BP: 102/54 (07/02 0600) Pulse Rate: 83 (07/02 0600)  Labs: Recent Labs    03/18/22 0519 03/18/22 0939 03/18/22 2209 03/19/22 0453 03/19/22 1312 03/19/22 2202  HGB 10.5*  --   --  8.7*  --   --   HCT 34.4*  --   --  28.1*  --   --   PLT 335  --   --  395  --   --   APTT 149*  --    < > 73* 64* 40*  HEPARINUNFRC  --   --   --  0.48 0.36  --   CREATININE 1.57*  --   --   --   --   --   TROPONINIHS 2,277* 1,819*  --   --   --   --    < > = values in this interval not displayed.     Estimated Creatinine Clearance: 40.7 mL/min (A) (by C-G formula based on SCr of 1.57 mg/dL (H)).   Medical History: Past Medical History:  Diagnosis Date   Arthritis    Basal cell carcinoma    L clavicle, L prox forearm- removed years ago    Bladder cancer (HCC)    Bladder neck contracture    CAD (coronary artery disease)    a. 12/2020 NSTEMI/Cath: LM nl, LAD 95ost/p, LCX 50p, RCA nl. EF 45-50%.   Cataract    CKD (chronic kidney disease), stage III [facility])    ED (erectile dysfunction)    Frequency    GERD (gastroesophageal reflux disease)    History of pulmonary embolus (PE) 11/2018   a. Following LE DVT-->Chronic warfarin.   Hyperlipidemia LDL goal <70    Hypertension    Hypothyroidism    Incontinence of urine    sui, s/p cryoablation   Ischemic cardiomyopathy    a. 11/2018 Echo: EF 60-65%; b. 12/2020 LV Gram: EF 45-50% in setting of NSTEMI.   Kidney stones    Neuropathy    feet   Nocturia    Prostate cancer (HCC)    S/P   CRYOABLATION    Right elbow tendinitis    Right Lower Extremity DVT (deep venous thrombosis) (HCC) 11/25/2018   Vertigo    1-2x/yr   Wears glasses     Medications:  Scheduled:   Chlorhexidine Gluconate Cloth  6 each Topical Daily   clopidogrel  75 mg Oral Daily   docusate sodium  100 mg Oral BID   insulin aspart  0-15 Units Subcutaneous TID WC   lactulose  20 g Oral BID   levothyroxine  50 mcg Oral Q0600   melatonin  5 mg Oral QHS   metoprolol tartrate  12.5 mg Oral BID   pantoprazole  40 mg Oral Daily   polyethylene glycol  17 g Oral BID   pravastatin  40 mg Oral Daily   senna-docusate  2 tablet Oral BID   sodium chloride flush  3 mL Intravenous Q12H    Assessment: 75 y.o. 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 Patient recently having  discontinued this about 1 week ago in preparation for placement of an artificial urinary sphincter procedure on on 03/09/22 and was instructed to continue to hold Eliquis 48 hours post-op. Presents to ED on 03/11/22 with pain and duskiness in the left foot extending towards the left calf. Pharmacy has been consulted for heparin infusion for VTE.   S/P thrombectomy 6/23, additional thromboembolectomy on 6/24 PM.  Pt was on heparin gtt since 6/24 @ ~ 2000. Pt was on Eliquis 10 mg PO BID. On 6/26 he was transitioned to aggrastat drip x 18 hrs with the hope for improving his forefoot perfusion, which was completed then transitioned to apixaban with last dose administered 0904 03/17/22 now with a new PE   Goal of Therapy:  Heparin level 0.3-0.7 units/ml aPTT 66 - 102 seconds Monitor platelets by anticoagulation protocol: Yes   Plan:  aPTT is therapeutic (not correlating well to heparin levels):  continue heparin infusion rate at 1100 units/hr  Recheck aPTT in 8 hours to confirm CBC daily while on heparin  Vallery Sa, PharmD, BCPS  03/20/2022 6:43 AM

## 2022-03-20 NOTE — Progress Notes (Signed)
Progress Note   Patient: Phillip [name] IBB:[id number] DOB: Jan 31, 1946 DOA: 03/11/2022     9 DOS: the patient was seen and examined on 03/20/2022   Brief hospital course: Phillip [name] is a 76 y.o. male with medical history significant for recurrent DVTs, post operative PE on Eliquis, unspecified thrombophilia, hypertension, hyperlipidemia, nephrolithiasis, malignant neoplasm of prostate post resection, bladder cancer, osteoarthritis, coronary artery disease status post PCI with stenting on Plavix, chronic diastolic CHF, who presents to [facility] ED from his PCP's office, Dr. Sabra Heck, due to concern for left critical lower limb ischemia.   Patient is seen by vascular surgery, had a lower extremity bypass surgery on 6/23.  Went back to the OR on 6/24 for L LE thrombectomy.  Patient also placed on heparin drip.  It appears that the patient left leg is nonsalvageable.  BKA scheduled. 6/28.  Developed episode of PSVT, hypoxia.  Appears to be flash pulm edema, given IV Lasix. BKA performed on 6/28. 6/30.  Patient developed hypoxia and tachycardia again with atrial fibrillation.  CT angiogram was performed 6/29 showed bilateral PE.  Thrombectomy performed on 6/30, heparin drip restarted. 7/1.  Patient had another episode of hypoxemia, requiring high flow oxygen.  Duplex ultrasound showed bilateral DVT, IVC filter placed.  Assessment and Plan:  Acute hypoxemic respiratory failure Bilateral pulmonary emboli. Bilateral lower extremity DVT. Flash pulmonary edema secondary to acute on chronic diastolic congestive heart failure. Paroxysmal A fib Patient is status post pulmonary thrombectomy.  IVC filter placed 7/1 due to bilateral lower extremity DVT. Patient appears to have recurrent PE even on therapeutic heparin drip.  As result, IVC filter is placed. Patient still have significant hypoxemia from recurrent PE.  Currently on heated high flow. I will continue heparin drip for now, with IVC filter, PE risk  is reduced. I will hold off PT/OT for now.   Constipation. Condition improving.   Elevated troponin Appreciate cardiology consult.  Appear to be secondary to large PE with heart strain   Left lower extremity ischemia.  Status post BKA Failed effort with revascularization and thrombectomy. Hold off PT OT today due to worsening hypoxemia.   History of bladder cancer s/p resection. History of prostate cancer. Urinary stress incontinence.  Status post artificial urinary sphincter on 6/21. No new issues     Acute kidney injury. Relatively stable, continue to follow  Coronary artery disease status post PCI and stenting. Continue Plavix, statin.  Reactive thrombocytosis. Acquired hemophilia. Continue heparin drip.    Essential hypertension. Continue metoprolol.  CODE STATUS. I have talked with the patient regarding CODE STATUS, patient is firm that he does not want intubation, no resuscitation.  Code changed to DNR status     Subjective:  Patient had a episode of severe short of breath hypoxemia after sitting up with OT.  He was placed on nonrebreather, later switched on heated high flow. He had a IVC filter placed for DVT. This morning, patient is comfortable on heated high flow.  He also had a bowel movement.  Physical Exam: Vitals:   03/20/22 0700 03/20/22 0759 03/20/22 0800 03/20/22 0900  BP: (!) 108/56  (!) 115/48 110/71  Pulse: 79  92 91  Resp: '17  17 18  '$ Temp:   98.1 F (36.7 C)   TempSrc:   Oral   SpO2: 100% 95% 99% 100%  Weight:      Height:       General exam: Appears calm and comfortable  Respiratory system: Clear to auscultation. Respiratory  effort normal. Cardiovascular system: S1 & S2 heard, RRR. No JVD, murmurs, rubs, gallops or clicks. No pedal edema. Gastrointestinal system: Abdomen is nondistended, soft and nontender. No organomegaly or masses felt. Normal bowel sounds heard. Central nervous system: Alert and oriented. No focal neurological  deficits. Extremities:Left BKA Skin: No rashes, lesions or ulcers Psychiatry: Judgement and insight appear normal. Mood & affect appropriate.   Data Reviewed:  Ultrasound images reviewed with tach, reviewed results.  Reviewed all lab results.  Family Communication:   Disposition: Status is: Inpatient Remains inpatient appropriate because: Severity of disease, IV treatment.  Condition still critical  Planned Discharge Destination: Home with Home Health    Time spent: 55 minutes  Author: Sharen Hones, MD 03/20/2022 10:27 AM  For on call review [email or web address].

## 2022-03-20 NOTE — Progress Notes (Signed)
[facility] Cardiology  SUBJECTIVE: Patient laying in bed, with high flow [place], denies chest pain, shortness of breath, palpitations or heart racing   Vitals:   03/20/22 0600 03/20/22 0700 03/20/22 0759 03/20/22 0800  BP: (!) 102/54 (!) 108/56  (!) 115/48  Pulse: 83 79  92  Resp: '18 17  17  '$ Temp:      TempSrc:      SpO2: 100% 100% 95% 99%  Weight:      Height:         Intake/Output Summary (Last 24 hours) at 03/20/2022 0830 Last data filed at 03/20/2022 0600 Gross per 24 hour  Intake 218.69 ml  Output 2251 ml  Net -2032.31 ml      PHYSICAL EXAM  General: Well developed, well nourished, in no acute distress HEENT:  Normocephalic and atramatic Neck:  No JVD.  Lungs: Clear bilaterally to auscultation and percussion. Heart: HRRR . Normal S1 and S2 without gallops or murmurs.  Abdomen: Bowel sounds are positive, abdomen soft and non-tender  Msk:  Back normal, normal gait. Normal strength and tone for age. Extremities: No clubbing, cyanosis or edema.   Neuro: Alert and oriented X 3. Psych:  Good affect, responds appropriately   LABS: Basic Metabolic Panel: Recent Labs    03/18/22 0519  NA 135  K 4.7  CL 102  CO2 24  GLUCOSE 183*  BUN 34*  CREATININE 1.57*  CALCIUM 8.5*  MG 2.2   Liver Function Tests: No results for input(s): "AST", "ALT", "ALKPHOS", "BILITOT", "PROT", "ALBUMIN" in the last 72 hours. No results for input(s): "LIPASE", "AMYLASE" in the last 72 hours. CBC: Recent Labs    03/18/22 0519 03/19/22 0453  WBC 17.9* 15.3*  HGB 10.5* 8.7*  HCT 34.4* 28.1*  MCV 88.9 88.4  PLT 335 395   Cardiac Enzymes: No results for input(s): "CKTOTAL", "CKMB", "CKMBINDEX", "TROPONINI" in the last 72 hours. BNP: Invalid input(s): "POCBNP" D-Dimer: No results for input(s): "DDIMER" in the last 72 hours. Hemoglobin A1C: No results for input(s): "HGBA1C" in the last 72 hours. Fasting Lipid Panel: No results for input(s): "CHOL", "HDL", "LDLCALC", "TRIG", "CHOLHDL",  "LDLDIRECT" in the last 72 hours. Thyroid Function Tests: No results for input(s): "TSH", "T4TOTAL", "T3FREE", "THYROIDAB" in the last 72 hours.  Invalid input(s): "FREET3" Anemia Panel: No results for input(s): "VITAMINB12", "FOLATE", "FERRITIN", "TIBC", "IRON", "RETICCTPCT" in the last 72 hours.  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 The superficial great saphenous vein was also interrogated. Spectral Doppler was utilized to evaluate flow at rest and with distal augmentation maneuvers in the common femoral, femoral and popliteal veins. COMPARISON:  11/21/2021 FINDINGS: RIGHT LOWER EXTREMITY Common Femoral Vein: No evidence of thrombus. Normal compressibility, respiratory phasicity and response to augmentation. Saphenofemoral Junction: No evidence of thrombus. Normal compressibility and flow on color Doppler imaging. Profunda Femoral Vein: No evidence of thrombus. Normal compressibility and flow on color Doppler imaging. Femoral Vein: Diffuse thrombosis. Popliteal Vein: Diffusely thrombosed. Calf Veins: Posterior tibial, peroneal, and gastrocnemius veins are  thrombosed. Venous Reflux:  None. Other Findings:  None. LEFT LOWER EXTREMITY Common Femoral Vein: No evidence of thrombus. Normal compressibility, respiratory phasicity and response to augmentation. Saphenofemoral Junction: No evidence of thrombus. Normal  compressibility and flow on color Doppler imaging. Profunda Femoral Vein: Partially thrombosed. Femoral Vein: No evidence of thrombus. Normal compressibility, respiratory phasicity and response to augmentation. Popliteal Vein: No evidence of thrombus. Normal compressibility, respiratory phasicity and response to augmentation. Other Findings:  None. IMPRESSION: 1. Thrombosis of the right femoral, popliteal, and calf veins. 2. Partially thrombosed left Profunda femoris vein. Electronically Signed   By: Miachel Roux M.D.   On: 03/19/2022 16:20   DG Chest Port 1 View  Result Date: 03/19/2022 CLINICAL DATA:  Shortness of breath, respiratory distress, coronary artery disease, stage III chronic kidney disease, hypertension, prostate cancer EXAM: PORTABLE CHEST 1 VIEW COMPARISON:  Portable exam 1401 hours compared to 03/16/2022 FINDINGS: Mild enlargement of cardiac silhouette. Mediastinal contours and pulmonary vascularity normal. Lungs clear. No acute infiltrate, pleural effusion, or pneumothorax. Osseous structures unremarkable. IMPRESSION: No acute abnormalities. Electronically Signed   By: Lavonia Dana M.D.   On: 03/19/2022 14:19   PERIPHERAL VASCULAR CATHETERIZATION  Result Date: 03/18/2022 See surgical note for result.  ECHOCARDIOGRAM COMPLETE  Result Date: 03/18/2022    ECHOCARDIOGRAM REPORT   Patient Name:   Phillip [name] Date of Exam: 03/18/2022 Medical Rec #:  [id number]        Height:       69.0 in Accession #:    [id number]       Weight:       161.2 lb Date of Birth:  04-Nov-1945        BSA:          1.885 m Patient Age:    76 years         BP:           126/84 mmHg Patient Gender: M                HR:           90 bpm. Exam Location:  [facility] Procedure: 2D Echo, Cardiac Doppler and Color Doppler Indications:     Atrial Fibrillation I48.91  History:         Patient has prior history of Echocardiogram examinations, most                  recent 12/25/2020. CAD; Risk Factors:Hypertension.  Sonographer:      Sherrie Sport Referring Phys:  [id number] Sharen Hones Diagnosing Phys: Isaias Cowman MD  Sonographer Comments: Technically challenging study due to limited acoustic windows, no apical window and no subcostal window. IMPRESSIONS  1. Left ventricular ejection fraction, by estimation, is 60 to 65%. The left ventricle has normal function. The left ventricle has no regional wall motion abnormalities. Left ventricular diastolic parameters are indeterminate.  2. Right ventricular systolic function is normal. The right ventricular size is normal.  3. Large thrombus noted in right atrium.  4. The mitral valve is normal in structure. Mild mitral valve regurgitation. No evidence of mitral stenosis.  5. The aortic valve is normal in structure. Aortic valve regurgitation is not visualized. No aortic stenosis is present.  6. The inferior vena cava is normal in size with greater than 50% respiratory variability, suggesting right atrial pressure of 3 mmHg. FINDINGS  Left Ventricle: RA thrombus noted. Left ventricular ejection fraction, by estimation, is 60 to 65%. The left ventricle has normal function. The  left ventricle has no regional wall motion abnormalities. The left ventricular internal cavity size was normal in size. There is no left ventricular hypertrophy. Left ventricular diastolic parameters are indeterminate. Right Ventricle: The right ventricular size is normal. No increase in right ventricular wall thickness. Right ventricular systolic function is normal. Left Atrium: Left atrial size was normal in size. Right Atrium: Large thrombus noted in right atrium. Right atrial size was normal in size. Pericardium: There is no evidence of pericardial effusion. Mitral Valve: The mitral valve is normal in structure. Mild mitral valve regurgitation. No evidence of mitral valve stenosis. Tricuspid Valve: The tricuspid valve is normal in structure. Tricuspid valve regurgitation is mild . No evidence of tricuspid stenosis. Aortic  Valve: The aortic valve is normal in structure. Aortic valve regurgitation is not visualized. No aortic stenosis is present. Pulmonic Valve: The pulmonic valve was normal in structure. Pulmonic valve regurgitation is not visualized. No evidence of pulmonic stenosis. Aorta: The aortic root is normal in size and structure. Venous: The inferior vena cava is normal in size with greater than 50% respiratory variability, suggesting right atrial pressure of 3 mmHg. IAS/Shunts: No atrial level shunt detected by color flow Doppler.  LEFT VENTRICLE PLAX 2D LVIDd:         3.95 cm LVIDs:         2.60 cm LV PW:         1.30 cm LV IVS:        1.40 cm  Isaias Cowman MD Electronically signed by Isaias Cowman MD Signature Date/Time: 03/18/2022/1:00:12 PM    Final (Updated)      Echo LVEF 60 to 65%, thrombus noted in right atrium and right ventricle (prior to mechanical thrombectomy), 03/18/2022  TELEMETRY: Sinus rhythm 80 bpm:  ASSESSMENT AND PLAN:  Principal Problem:   Arterial occlusion Active Problems:   Bilateral pulmonary embolism (HCC)   HTN (hypertension)   History of prostate cancer   Coronary artery disease   AKI (acute kidney injury) ([place])   Critical limb ischemia of left lower extremity with ulceration of lower leg (HCC)   Acute respiratory failure with hypoxia (HCC)   Acute on chronic diastolic CHF (congestive heart failure) (HCC)   PSVT (paroxysmal supraventricular tachycardia) ([place])    1. 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  Bilateral pulmonary emboli, status post mechanical thrombectomy 03/18/2022, status post IVC filter 03/19/2022 3.  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  Elevated troponin, likely demand supply ischemia, secondary to submassive bilateral pulmonary emboli 5.  CAD, history of NSTEMI status post DES proximal  LAD 12/2020   Recommendations   1.  Agree with current therapy 2.  Continue heparin infusion with plan to transition back to Eliquis 3.  Continue metoprolol tartrate at current dose 4.  Continue clopidogrel 5.  Continue pravastatin   Isaias Cowman, MD, PhD, [facility] 03/20/2022 8:30 AM

## 2022-03-20 NOTE — Progress Notes (Signed)
[place] for heparin infusion initiation and monitoring Indication: pulmonary embolus  Allergies  Allergen Reactions   Doxazosin Other (See Comments)    Other reaction(s): Unknown    Patient Measurements: Height: '5\' 9"'$  (175.3 cm) Weight: 73.1 kg (161 lb 2.5 oz) IBW/kg (Calculated) : 70.7 Heparin Dosing Weight: 79.3 kg  Vital Signs: Temp: 99.1 F (37.3 C) (07/02 1600) Temp Source: Oral (07/02 1600) BP: 107/52 (07/02 1600) Pulse Rate: 83 (07/02 1600)  Labs: Recent Labs    03/18/22 0519 03/18/22 0939 03/18/22 2209 03/19/22 0453 03/19/22 1312 03/19/22 2202 03/20/22 0805 03/20/22 1543  HGB 10.5*  --   --  8.7*  --   --  9.3*  --   HCT 34.4*  --   --  28.1*  --   --  29.6*  --   PLT 335  --   --  395  --   --  581*  --   APTT 149*  --    < > 73* 64* 40* 102* 103*  HEPARINUNFRC  --   --   --  0.48 0.36  --  0.41  --   CREATININE 1.57*  --   --   --   --   --  1.35*  --   TROPONINIHS 2,277* 1,819*  --   --   --   --   --   --    < > = values in this interval not displayed.     Estimated Creatinine Clearance: 47.3 mL/min (A) (by C-G formula based on SCr of 1.35 mg/dL (H)).   Medical History: Past Medical History:  Diagnosis Date   Arthritis    Basal cell carcinoma    L clavicle, L prox forearm- removed years ago    Bladder cancer (HCC)    Bladder neck contracture    CAD (coronary artery disease)    a. 12/2020 NSTEMI/Cath: LM nl, LAD 95ost/p, LCX 50p, RCA nl. EF 45-50%.   Cataract    CKD (chronic kidney disease), stage III [facility])    ED (erectile dysfunction)    Frequency    GERD (gastroesophageal reflux disease)    History of pulmonary embolus (PE) 11/2018   a. Following LE DVT-->Chronic warfarin.   Hyperlipidemia LDL goal <70    Hypertension    Hypothyroidism    Incontinence of urine    sui, s/p cryoablation   Ischemic cardiomyopathy    a. 11/2018 Echo: EF 60-65%; b. 12/2020 LV Gram: EF 45-50% in setting of NSTEMI.    Kidney stones    Neuropathy    feet   Nocturia    Prostate cancer (HCC)    S/P   CRYOABLATION   Right elbow tendinitis    Right Lower Extremity DVT (deep venous thrombosis) (HCC) 11/25/2018   Vertigo    1-2x/yr   Wears glasses     Medications:  Scheduled:   Chlorhexidine Gluconate Cloth  6 each Topical Daily   clopidogrel  75 mg Oral Daily   docusate sodium  100 mg Oral BID   insulin aspart  0-15 Units Subcutaneous TID WC   iron polysaccharides  150 mg Oral Daily   levothyroxine  50 mcg Oral Q0600   melatonin  5 mg Oral QHS   metoprolol tartrate  12.5 mg Oral BID   pantoprazole  40 mg Oral Daily   polyethylene glycol  17 g Oral BID   pravastatin  40 mg Oral Daily   senna-docusate  2 tablet  Oral BID   sodium chloride flush  3 mL Intravenous Q12H    Assessment: 76 y.o. 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 Patient recently having discontinued this about 1 week ago in preparation for placement of an artificial urinary sphincter procedure on on 03/09/22 and was instructed to continue to hold Eliquis 48 hours post-op. Presents to ED on 03/11/22 with pain and duskiness in the left foot extending towards the left calf. Pharmacy has been consulted for heparin infusion for VTE.   S/P thrombectomy 6/23, additional thromboembolectomy on 6/24 PM.  Pt was on heparin gtt since 6/24 @ ~ 2000. Pt was on Eliquis 10 mg PO BID. On 6/26 he was transitioned to aggrastat drip x 18 hrs with the hope for improving his forefoot perfusion, which was completed then transitioned to apixaban with last dose administered 0904 03/17/22 now with a new PE   7/2 0805 aPTT= 102 Therapeutic 7/2 1611 aPTT=103  Slightly supratherapeutic, decrease from 1100 units/hr to 1000 units/hr  Goal of Therapy:  Heparin level 0.3-0.7 units/ml aPTT 66 - 102 seconds Monitor platelets by anticoagulation protocol: Yes   Plan:  7/2 1611 aPTT=103   Slightly supratherapeutic, decrease from 1100  units/hr to 1000 units/hr Recheck aPTT in 8 hours to confirm HL once a day till correlating CBC daily while on heparin  Chinita Greenland PharmD Clinical Pharmacist 03/20/2022

## 2022-03-20 NOTE — Progress Notes (Signed)
PT Cancellation Note  Patient Details Name: Phillip [name] MRN: [id number] DOB: 07/02/1946   Cancelled Treatment:    Reason Eval/Treat Not Completed: Patient not medically ready: Per chart review pt increased to HFNC and required IVC filter placement 03/19/22.  MD requested PT/OT orders to be completed and will await new orders once pt medically appropriate.     Linus Salmons PT, DPT 03/20/22, 9:34 AM

## 2022-03-21 ENCOUNTER — Encounter: Payer: Self-pay | Admitting: Vascular Surgery

## 2022-03-21 DIAGNOSIS — I7090 Unspecified atherosclerosis: Secondary | ICD-10-CM | POA: Diagnosis not present

## 2022-03-21 DIAGNOSIS — I82413 Acute embolism and thrombosis of femoral vein, bilateral: Secondary | ICD-10-CM | POA: Diagnosis not present

## 2022-03-21 DIAGNOSIS — D75838 Other thrombocytosis: Secondary | ICD-10-CM

## 2022-03-21 DIAGNOSIS — I2699 Other pulmonary embolism without acute cor pulmonale: Secondary | ICD-10-CM | POA: Diagnosis not present

## 2022-03-21 LAB — GLUCOSE, CAPILLARY
Glucose-Capillary: 124 mg/dL — ABNORMAL HIGH (ref 70–99)
Glucose-Capillary: 168 mg/dL — ABNORMAL HIGH (ref 70–99)
Glucose-Capillary: 151 mg/dL — ABNORMAL HIGH (ref 70–99)
Glucose-Capillary: 152 mg/dL — ABNORMAL HIGH (ref 70–99)

## 2022-03-21 LAB — CBC
HCT: 30.4 % — ABNORMAL LOW (ref 39.0–52.0)
Hemoglobin: 9.4 g/dL — ABNORMAL LOW (ref 13.0–17.0)
MCH: 27.6 pg (ref 26.0–34.0)
MCHC: 30.9 g/dL (ref 30.0–36.0)
MCV: 89.1 fL (ref 80.0–100.0)
Platelets: 685 10*3/uL — ABNORMAL HIGH (ref 150–400)
RBC: 3.41 MIL/uL — ABNORMAL LOW (ref 4.22–5.81)
RDW: 16.8 % — ABNORMAL HIGH (ref 11.5–15.5)
WBC: 17.9 10*3/uL — ABNORMAL HIGH (ref 4.0–10.5)
nRBC: 0.6 % — ABNORMAL HIGH (ref 0.0–0.2)

## 2022-03-21 LAB — APTT
aPTT: 81 seconds — ABNORMAL HIGH (ref 24–36)
aPTT: 65 seconds — ABNORMAL HIGH (ref 24–36)

## 2022-03-21 LAB — HEPARIN LEVEL (UNFRACTIONATED)
Heparin Unfractionated: 0.31 IU/mL (ref 0.30–0.70)
Heparin Unfractionated: 0.28 IU/mL — ABNORMAL LOW (ref 0.30–0.70)

## 2022-03-21 MED ORDER — QUETIAPINE FUMARATE 25 MG PO TABS
25.0000 mg | ORAL_TABLET | Freq: Every day | ORAL | Status: DC
  Administered 2022-03-21 – 2022-03-27 (×6): 25 mg via ORAL
  Filled 2022-03-21 (×7): qty 1

## 2022-03-21 MED ORDER — CHLORHEXIDINE GLUCONATE CLOTH 2 % EX PADS
6.0000 | MEDICATED_PAD | Freq: Every day | CUTANEOUS | Status: DC
  Administered 2022-03-21 – 2022-03-22 (×2): 6 via TOPICAL

## 2022-03-21 MED ORDER — HEPARIN BOLUS VIA INFUSION
1200.0000 [IU] | Freq: Once | INTRAVENOUS | Status: AC
  Administered 2022-03-21: 1200 [IU] via INTRAVENOUS
  Filled 2022-03-21: qty 1200

## 2022-03-21 NOTE — Progress Notes (Signed)
[facility] Cardiology  SUBJECTIVE:  - Continues to have significant shortness of breath with movement and transfers to use bedside commode.  - Oxygen requirement slowing improving; on 4 L O2.  - Denies chest pain.  - Very emotional regarding hospitalization.    Vitals:   03/21/22 0600 03/21/22 0609 03/21/22 0700 03/21/22 0800  BP: 130/75  126/65 (!) 145/71  Pulse: 87 78 83 82  Resp: 19 20 (!) 21 (!) 22  Temp: 97.8 F (36.6 C)   98.4 F (36.9 C)  TempSrc: Oral   Oral  SpO2: 100% 99% 100% 93%  Weight:      Height:         Intake/Output Summary (Last 24 hours) at 03/21/2022 0912 Last data filed at 03/21/2022 0800 Gross per 24 hour  Intake 477.84 ml  Output 1500 ml  Net -1022.16 ml       PHYSICAL EXAM  General: Well developed, well nourished, in no acute distress HEENT:  Normocephalic and atramatic Neck:  No JVD.  Lungs: Clear bilaterally to auscultation and percussion. On [place] O2. Heart: HRRR . Normal S1 and S2 without gallops or murmurs.  Abdomen: Bowel sounds are positive, abdomen soft and non-tender  Msk:  Back normal, normal gait. Normal strength and tone for age. Extremities: s/p L BKA Neuro: Alert and oriented X 3. Psych:  Good affect, responds appropriately   LABS: Basic Metabolic Panel: Recent Labs    03/20/22 0805  NA 137  K 4.3  CL 105  CO2 24  GLUCOSE 127*  BUN 28*  CREATININE 1.35*  CALCIUM 8.3*  MG 2.4    Liver Function Tests: No results for input(s): "AST", "ALT", "ALKPHOS", "BILITOT", "PROT", "ALBUMIN" in the last 72 hours. No results for input(s): "LIPASE", "AMYLASE" in the last 72 hours. CBC: Recent Labs    03/20/22 0805 03/21/22 0135  WBC 15.3* 17.9*  HGB 9.3* 9.4*  HCT 29.6* 30.4*  MCV 87.8 89.1  PLT 581* 685*    Cardiac Enzymes: No results for input(s): "CKTOTAL", "CKMB", "CKMBINDEX", "TROPONINI" in the last 72 hours. BNP: Invalid input(s): "POCBNP" D-Dimer: No results for input(s): "DDIMER" in the last 72 hours. Hemoglobin  A1C: No results for input(s): "HGBA1C" in the last 72 hours. Fasting Lipid Panel: No results for input(s): "CHOL", "HDL", "LDLCALC", "TRIG", "CHOLHDL", "LDLDIRECT" in the last 72 hours. Thyroid Function Tests: No results for input(s): "TSH", "T4TOTAL", "T3FREE", "THYROIDAB" in the last 72 hours.  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 The superficial great saphenous vein was also interrogated. Spectral Doppler was utilized to evaluate flow at rest and with distal augmentation maneuvers in the common femoral, femoral and popliteal veins. COMPARISON:  11/21/2021 FINDINGS: RIGHT LOWER EXTREMITY Common Femoral Vein: No evidence of thrombus. Normal compressibility, respiratory phasicity and response to augmentation. Saphenofemoral Junction: No evidence of thrombus. Normal compressibility and flow on  color Doppler imaging. Profunda Femoral Vein: No evidence of thrombus. Normal compressibility and flow on color Doppler imaging. Femoral Vein: Diffuse thrombosis. Popliteal Vein: Diffusely thrombosed. Calf Veins: Posterior tibial, peroneal, and gastrocnemius veins are thrombosed. Venous Reflux:  None. Other Findings:  None. LEFT LOWER EXTREMITY Common Femoral Vein: No evidence of thrombus. Normal  compressibility, respiratory phasicity and response to augmentation. Saphenofemoral Junction: No evidence of thrombus. Normal compressibility and flow on color Doppler imaging. Profunda Femoral Vein: Partially thrombosed. Femoral Vein: No evidence of thrombus. Normal compressibility, respiratory phasicity and response to augmentation. Popliteal Vein: No evidence of thrombus. Normal compressibility, respiratory phasicity and response to augmentation. Other Findings:  None. IMPRESSION: 1. Thrombosis of the right femoral, popliteal, and calf veins. 2. Partially thrombosed left Profunda femoris vein. Electronically Signed   By: Miachel Roux M.D.   On: 03/19/2022 16:20   DG Chest Port 1 View  Result Date: 03/19/2022 CLINICAL DATA:  Shortness of breath, respiratory distress, coronary artery disease, stage III chronic kidney disease, hypertension, prostate cancer EXAM: PORTABLE CHEST 1 VIEW COMPARISON:  Portable exam 1401 hours compared to 03/16/2022 FINDINGS: Mild enlargement of cardiac silhouette. Mediastinal contours and pulmonary vascularity normal. Lungs clear. No acute infiltrate, pleural effusion, or pneumothorax. Osseous structures unremarkable. IMPRESSION: No acute abnormalities. Electronically Signed   By: Lavonia Dana M.D.   On: 03/19/2022 14:19     Echo LVEF 60 to 65%, thrombus noted in right atrium and right ventricle (prior to mechanical thrombectomy), 03/18/2022  TELEMETRY: Sinus rhythm 80 bpm:  ASSESSMENT AND PLAN:  Principal Problem:   Arterial occlusion Active Problems:   Bilateral pulmonary embolism (HCC)   HTN (hypertension)   History of prostate cancer   Coronary artery disease   AKI (acute kidney injury) ([place])   Critical limb ischemia of left lower extremity with ulceration of lower leg (HCC)   Acute respiratory failure with hypoxia (HCC)   Acute on chronic diastolic CHF (congestive heart failure) (HCC)   PSVT (paroxysmal supraventricular tachycardia) (HCC)   Paroxysmal atrial  fibrillation with RVR (HCC)   Acute bilateral deep vein thrombosis (DVT) of femoral veins (HCC)   Reactive thrombocytosis    1. 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  Submassive pulmonary embolism, status post mechanical thrombectomy 03/18/2022, status post IVC filter 03/19/2022 3.  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  Elevated troponin, likely demand supply ischemia, secondary to submassive bilateral pulmonary emboli 5.  CAD, history of NSTEMI status post DES proximal LAD 12/2020 6. Acute limb ischemia s/p L BKA 03/16/22   Recommendations   1.  Agree with current therapy 2.  Continue heparin infusion with plan to transition back to Eliquis; defer to vascular surgery regarding timing.  3.  Continue metoprolol tartrate at current dose 4.  Continue clopidogrel 5.  Continue pravastatin   Andrez Grime, MD 03/21/2022 9:12 AM

## 2022-03-21 NOTE — Progress Notes (Signed)
[place] for heparin infusion initiation and monitoring Indication: pulmonary embolus  Allergies  Allergen Reactions   Doxazosin Other (See Comments)    Other reaction(s): Unknown    Patient Measurements: Height: '5\' 9"'$  (175.3 cm) Weight: 73.1 kg (161 lb 2.5 oz) IBW/kg (Calculated) : 70.7 Heparin Dosing Weight: 79.3 kg  Vital Signs: Temp: 99.7 F (37.6 C) (07/02 2100) Temp Source: Oral (07/02 2100) BP: 81/63 (07/03 0000) Pulse Rate: 80 (07/03 0000)  Labs: Recent Labs    03/18/22 0519 03/18/22 6073 03/18/22 2209 03/19/22 0453 03/19/22 1312 03/19/22 2202 03/20/22 0805 03/20/22 1543 03/21/22 0135  HGB 10.5*  --   --  8.7*  --   --  9.3*  --  9.4*  HCT 34.4*  --   --  28.1*  --   --  29.6*  --  30.4*  PLT 335  --   --  395  --   --  581*  --  685*  APTT 149*  --    < > 73* 64*   < > 102* 103* 81*  HEPARINUNFRC  --   --   --  0.48 0.36  --  0.41  --   --   CREATININE 1.57*  --   --   --   --   --  1.35*  --   --   TROPONINIHS 2,277* 1,819*  --   --   --   --   --   --   --    < > = values in this interval not displayed.     Estimated Creatinine Clearance: 47.3 mL/min (A) (by C-G formula based on SCr of 1.35 mg/dL (H)).   Medical History: Past Medical History:  Diagnosis Date   Arthritis    Basal cell carcinoma    L clavicle, L prox forearm- removed years ago    Bladder cancer (HCC)    Bladder neck contracture    CAD (coronary artery disease)    a. 12/2020 NSTEMI/Cath: LM nl, LAD 95ost/p, LCX 50p, RCA nl. EF 45-50%.   Cataract    CKD (chronic kidney disease), stage III [facility])    ED (erectile dysfunction)    Frequency    GERD (gastroesophageal reflux disease)    History of pulmonary embolus (PE) 11/2018   a. Following LE DVT-->Chronic warfarin.   Hyperlipidemia LDL goal <70    Hypertension    Hypothyroidism    Incontinence of urine    sui, s/p cryoablation   Ischemic cardiomyopathy    a. 11/2018 Echo: EF 60-65%; b.  12/2020 LV Gram: EF 45-50% in setting of NSTEMI.   Kidney stones    Neuropathy    feet   Nocturia    Prostate cancer (HCC)    S/P   CRYOABLATION   Right elbow tendinitis    Right Lower Extremity DVT (deep venous thrombosis) (HCC) 11/25/2018   Vertigo    1-2x/yr   Wears glasses     Medications:  Scheduled:   Chlorhexidine Gluconate Cloth  6 each Topical Daily   clopidogrel  75 mg Oral Daily   docusate sodium  100 mg Oral BID   insulin aspart  0-15 Units Subcutaneous TID WC   iron polysaccharides  150 mg Oral Daily   levothyroxine  50 mcg Oral Q0600   melatonin  5 mg Oral QHS   metoprolol tartrate  12.5 mg Oral BID   pantoprazole  40 mg Oral Daily   polyethylene glycol  17 g Oral BID   pravastatin  40 mg Oral Daily   senna-docusate  2 tablet Oral BID   sodium chloride flush  3 mL Intravenous Q12H    Assessment: 76 y.o. 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 Patient recently having discontinued this about 1 week ago in preparation for placement of an artificial urinary sphincter procedure on on 03/09/22 and was instructed to continue to hold Eliquis 48 hours post-op. Presents to ED on 03/11/22 with pain and duskiness in the left foot extending towards the left calf. Pharmacy has been consulted for heparin infusion for VTE.   S/P thrombectomy 6/23, additional thromboembolectomy on 6/24 PM.  Pt was on heparin gtt since 6/24 @ ~ 2000. Pt was on Eliquis 10 mg PO BID. On 6/26 he was transitioned to aggrastat drip x 18 hrs with the hope for improving his forefoot perfusion, which was completed then transitioned to apixaban with last dose administered 0904 03/17/22 now with a new PE   7/2 0805 aPTT= 102 Therapeutic 7/2 1611 aPTT=103  Slightly supratherapeutic, decrease from 1100 units/hr to 1000 units/hr 7/03 0135 aPTT = 81, therapeutic x 1  Goal of Therapy:  Heparin level 0.3-0.7 units/ml aPTT 66 - 102 seconds Monitor platelets by anticoagulation protocol:  Yes   Plan:  Continue heparin infusion at 1000 units/hr Recheck aPTT in 8 hours to confirm HL once a day till correlating CBC daily while on heparin  Renda Rolls, PharmD, [facility] 03/21/2022 2:13 AM

## 2022-03-21 NOTE — Progress Notes (Signed)
Two attempts made to get patient up to chair. First attempt at 11:30 ended with only dangling at the edge of bed. Patient became very short of breath, breathing labored, oxygen saturations dropped to mid 80s on 4 L and stayed that way even when nasal cannula increased to 6L while patient holding himself in almost tripod position on the edge of bed and consciously trying breathing in through his nose and out through his mouth. Patient's oxygen saturations recovered quicker than patient's work of breathing which only calmed when attempt to chair ended and patient helped back fully into bed.  Second attempt at 16:33 resulted in patient getting into chair with two person assist. Patient dropped his saturations again to mid/low 80s but while reporting shortness of breath with movement again, patient appeared slightly less labored than earlier and titrated back to 2L once in chair.   Patient requested back to bed 17:38. Patient back to bed with 2 person assist. Breathing very labored, greater than even at first midday attempt to chair. Patient oxygen saturations dropped to 76 (oxygen titrated to 6L nasal cannula). Took patient longer when back in bed to settle breathing down than earlier attempt.   Patient seems very discouraged about progress breathing and activity (like getting up to chair). Kept repeating in the last transition back to bed, "I just don't have it".

## 2022-03-21 NOTE — Progress Notes (Signed)
Progress Note   Patient: Phillip [name] ZOX:[id number] DOB: [birth year] DOA: 03/11/2022     10 DOS: the patient was seen and examined on 03/21/2022   Brief hospital course: NILS THOR is a 76 y.o. male with medical history significant for recurrent DVTs, post operative PE on Eliquis, unspecified thrombophilia, hypertension, hyperlipidemia, nephrolithiasis, malignant neoplasm of prostate post resection, bladder cancer, osteoarthritis, coronary artery disease status post PCI with stenting on Plavix, chronic diastolic CHF, who presents to [facility] ED from his PCP's office, Dr. Sabra Heck, due to concern for left critical lower limb ischemia.   Patient is seen by vascular surgery, had a lower extremity bypass surgery on 6/23.  Went back to the OR on 6/24 for L LE thrombectomy.  Patient also placed on heparin drip.  It appears that the patient left leg is nonsalvageable.  BKA scheduled. 6/28.  Developed episode of PSVT, hypoxia.  Appears to be flash pulm edema, given IV Lasix. BKA performed on 6/28. 6/30.  Patient developed hypoxia and tachycardia again with atrial fibrillation.  CT angiogram was performed 6/29 showed bilateral PE.  Thrombectomy performed on 6/30, heparin drip restarted. 7/1.  Patient had another episode of hypoxemia, requiring high flow oxygen.  Duplex ultrasound showed bilateral DVT, IVC filter placed.  Assessment and Plan: Acute hypoxemic respiratory failure Bilateral pulmonary emboli. Bilateral lower extremity DVT. Flash pulmonary edema secondary to acute on chronic diastolic congestive heart failure. Paroxysmal A fib Patient is status post pulmonary thrombectomy.  IVC filter placed 7/1 due to bilateral lower extremity DVT. Patient appears to have recurrent PE even on therapeutic heparin drip.  As result, IVC filter is placed. Due to recurrent thromboembolic event, patient will be treated with IV heparin until respiratory status improved.  Then transition to oral Eliquis. Continue  monitor patient closely, wean oxygen.  Insomnia. Has not been able to sleep in the hospital, melatonin does not seem to work.  I will start lower dose Seroquel.  Reviewed patient EKG, no significant QT interval prolongation.   Constipation. Had multiple bowel movements after giving lactulose.  Continue senna.    Elevated troponin Appreciate cardiology consult.  Appear to be secondary to large PE with heart strain   Left lower extremity ischemia.  Status post BKA Failed effort with revascularization and thrombectomy. Patient will be transferred to the chair and sit 2 to 4 hours a day.  We will restart PT/OT tomorrow if respiratory status is more stable.   History of bladder cancer s/p resection. History of prostate cancer. Urinary stress incontinence.  Status post artificial urinary sphincter on 6/21. No new issues     Acute kidney injury. Relatively stable, continue to follow  Coronary artery disease status post PCI and stenting. Continue Plavix, statin.   Reactive thrombocytosis. Acquired hemophilia. Continue heparin drip.    Essential hypertension. Continue metoprolol.      Subjective:  Patient could not sleep at nighttime, otherwise feels better.  No shortness of breath.  Had multiple bowel movements.  Physical Exam: Vitals:   03/21/22 1139 03/21/22 1145 03/21/22 1156 03/21/22 1200  BP:   134/69 121/67  Pulse: 81 81 79 78  Resp: (!) 22 17 (!) 22 18  Temp:   98.5 F (36.9 C)   TempSrc:   Oral   SpO2: 94% 100% 98% 96%  Weight:      Height:       General exam: Appears calm and comfortable  Respiratory system: Clear to auscultation. Respiratory effort normal. Cardiovascular system: S1 & S2  heard, RRR. No JVD, murmurs, rubs, gallops or clicks. No pedal edema. Gastrointestinal system: Abdomen is nondistended, soft and nontender. No organomegaly or masses felt. Normal bowel sounds heard. Central nervous system: Alert and oriented. No focal neurological  deficits. Extremities: Status left BKA. Skin: No rashes, lesions or ulcers Psychiatry: Judgement and insight appear normal. Mood & affect appropriate.   Data Reviewed:  Lab results reviewed.  Family Communication: Son updated at bedside.  Disposition: Status is: Inpatient Remains inpatient appropriate because: Verity of disease, IV heparin.  Planned Discharge Destination:  pending    Time spent: 50 minutes  Author: Sharen Hones, MD 03/21/2022 1:14 PM  For on call review [email or web address].

## 2022-03-21 NOTE — Progress Notes (Signed)
ANTICOAGULATION CONSULT NOTE  Pharmacy Consult for Heparin Infusion Indication: pulmonary embolus  Patient Measurements: Height: '5\' 9"'$  (175.3 cm) Weight: 73.1 kg (161 lb 2.5 oz) IBW/kg (Calculated) : 70.7 Heparin Dosing Weight: 79.3 kg  Labs: Recent Labs    03/19/22 0453 03/19/22 1312 03/19/22 2202 03/20/22 0805 03/20/22 1543 03/21/22 0135 03/21/22 0949  HGB 8.7*  --   --  9.3*  --  9.4*  --   HCT 28.1*  --   --  29.6*  --  30.4*  --   PLT 395  --   --  581*  --  685*  --   APTT 73* 64*   < > 102* 103* 81* 65*  HEPARINUNFRC 0.48 0.36  --  0.41  --   --  0.31  CREATININE  --   --   --  1.35*  --   --   --    < > = values in this interval not displayed.     Estimated Creatinine Clearance: 47.3 mL/min (A) (by C-G formula based on SCr of 1.35 mg/dL (H)).   Medical History: Past Medical History:  Diagnosis Date   Arthritis    Basal cell carcinoma    L clavicle, L prox forearm- removed years ago    Bladder cancer (HCC)    Bladder neck contracture    CAD (coronary artery disease)    a. 12/2020 NSTEMI/Cath: LM nl, LAD 95ost/p, LCX 50p, RCA nl. EF 45-50%.   Cataract    CKD (chronic kidney disease), stage III [facility])    ED (erectile dysfunction)    Frequency    GERD (gastroesophageal reflux disease)    History of pulmonary embolus (PE) 11/2018   a. Following LE DVT-->Chronic warfarin.   Hyperlipidemia LDL goal <70    Hypertension    Hypothyroidism    Incontinence of urine    sui, s/p cryoablation   Ischemic cardiomyopathy    a. 11/2018 Echo: EF 60-65%; b. 12/2020 LV Gram: EF 45-50% in setting of NSTEMI.   Kidney stones    Neuropathy    feet   Nocturia    Prostate cancer (HCC)    S/P   CRYOABLATION   Right elbow tendinitis    Right Lower Extremity DVT (deep venous thrombosis) (HCC) 11/25/2018   Vertigo    1-2x/yr   Wears glasses     Assessment: 76 y.o. 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 Patient recently having discontinued this  about 1 week ago in preparation for placement of an artificial urinary sphincter procedure on on 03/09/22 and was instructed to continue to hold Eliquis 48 hours post-op. Presents to ED on 03/11/22 with pain and duskiness in the left foot extending towards the left calf. Pharmacy has been consulted for heparin infusion for VTE.   S/P thrombectomy 6/23, additional thromboembolectomy on 6/24 PM.  Pt was on heparin gtt since 6/24 @ ~ 2000. Pt was on Eliquis 10 mg PO BID. On 6/26 he was transitioned to aggrastat drip x 18 hrs with the hope for improving his forefoot perfusion, which was completed then transitioned to apixaban with last dose administered 0904 03/17/22 now with a new PE   7/2 0805 aPTT= 102 Therapeutic 7/2 1611 aPTT=103  Supratherapeutic, decrease from 1100 units/hr to 1000 units/hr 7/3 0135 aPTT = 81, therapeutic x 1 7/3 0949 aPTT = 65, HL 0.31, therapeutic at lower limit of range  Goal of Therapy:  Heparin level 0.3-0.7 units/ml Monitor platelets by  anticoagulation protocol: Yes   Plan:  --aPTT slightly subtherapeutic and HL therapeutic at lower limit of goal --Increase heparin infusion to 1050 units/hr --HL and aPTT correlating. Will re-check HL in 8 hours from rate change --Daily CBC per protocol  Benita Gutter  03/21/2022 10:55 AM

## 2022-03-21 NOTE — Progress Notes (Signed)
ANTICOAGULATION CONSULT NOTE  Pharmacy Consult for Heparin Infusion Indication: pulmonary embolus  Patient Measurements: Height: '5\' 9"'$  (175.3 cm) Weight: 73.1 kg (161 lb 2.5 oz) IBW/kg (Calculated) : 70.7 Heparin Dosing Weight: 79.3 kg  Labs: Recent Labs    03/19/22 0453 03/19/22 1312 03/20/22 0805 03/20/22 1543 03/21/22 0135 03/21/22 0949 03/21/22 2008  HGB 8.7*  --  9.3*  --  9.4*  --   --   HCT 28.1*  --  29.6*  --  30.4*  --   --   PLT 395  --  581*  --  685*  --   --   APTT 73*   < > 102* 103* 81* 65*  --   HEPARINUNFRC 0.48   < > 0.41  --   --  0.31 0.28*  CREATININE  --   --  1.35*  --   --   --   --    < > = values in this interval not displayed.     Estimated Creatinine Clearance: 47.3 mL/min (A) (by C-G formula based on SCr of 1.35 mg/dL (H)).   Medical History: Past Medical History:  Diagnosis Date   Arthritis    Basal cell carcinoma    L clavicle, L prox forearm- removed years ago    Bladder cancer (HCC)    Bladder neck contracture    CAD (coronary artery disease)    a. 12/2020 NSTEMI/Cath: LM nl, LAD 95ost/p, LCX 50p, RCA nl. EF 45-50%.   Cataract    CKD (chronic kidney disease), stage III [facility])    ED (erectile dysfunction)    Frequency    GERD (gastroesophageal reflux disease)    History of pulmonary embolus (PE) 11/2018   a. Following LE DVT-->Chronic warfarin.   Hyperlipidemia LDL goal <70    Hypertension    Hypothyroidism    Incontinence of urine    sui, s/p cryoablation   Ischemic cardiomyopathy    a. 11/2018 Echo: EF 60-65%; b. 12/2020 LV Gram: EF 45-50% in setting of NSTEMI.   Kidney stones    Neuropathy    feet   Nocturia    Prostate cancer (HCC)    S/P   CRYOABLATION   Right elbow tendinitis    Right Lower Extremity DVT (deep venous thrombosis) (HCC) 11/25/2018   Vertigo    1-2x/yr   Wears glasses     Assessment: 76 y.o. 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 Patient recently having discontinued  this about 1 week ago in preparation for placement of an artificial urinary sphincter procedure on on 03/09/22 and was instructed to continue to hold Eliquis 48 hours post-op. Presents to ED on 03/11/22 with pain and duskiness in the left foot extending towards the left calf. Pharmacy has been consulted for heparin infusion for VTE.   S/P thrombectomy 6/23, additional thromboembolectomy on 6/24 PM.  Pt was on heparin gtt since 6/24 @ ~ 2000. Pt was on Eliquis 10 mg PO BID. On 6/26 he was transitioned to aggrastat drip x 18 hrs with the hope for improving his forefoot perfusion, which was completed then transitioned to apixaban with last dose administered 0904 03/17/22 now with a new PE   7/2 0805 aPTT= 102 Therapeutic 7/2 1611 aPTT=103  Supratherapeutic, decrease from 1100 units/hr to 1000 units/hr 7/3 0135 aPTT = 81, therapeutic x 1 7/3 0949 aPTT = 65, HL 0.31, therapeutic at lower limit of range 7/3 2008 HL 0.28, subtherapeutic  Goal of Therapy:  Heparin level 0.3-0.7 units/ml Monitor platelets by anticoagulation protocol: Yes   Plan:  --HL slightly subtherapeutic --Bolus 1200 units x 1 --Increase heparin infusion to 1200 units/hr --Will re-check HL with AM labs --Daily CBC per protocol  Pearla Dubonnet  03/21/2022 8:41 PM

## 2022-03-22 DIAGNOSIS — I5033 Acute on chronic diastolic (congestive) heart failure: Secondary | ICD-10-CM | POA: Diagnosis not present

## 2022-03-22 DIAGNOSIS — I7090 Unspecified atherosclerosis: Secondary | ICD-10-CM | POA: Diagnosis not present

## 2022-03-22 DIAGNOSIS — I2699 Other pulmonary embolism without acute cor pulmonale: Secondary | ICD-10-CM | POA: Diagnosis not present

## 2022-03-22 DIAGNOSIS — J9601 Acute respiratory failure with hypoxia: Secondary | ICD-10-CM | POA: Diagnosis not present

## 2022-03-22 LAB — GLUCOSE, CAPILLARY
Glucose-Capillary: 131 mg/dL — ABNORMAL HIGH (ref 70–99)
Glucose-Capillary: 120 mg/dL — ABNORMAL HIGH (ref 70–99)
Glucose-Capillary: 136 mg/dL — ABNORMAL HIGH (ref 70–99)

## 2022-03-22 LAB — BASIC METABOLIC PANEL
Anion gap: 4 — ABNORMAL LOW (ref 5–15)
BUN: 28 mg/dL — ABNORMAL HIGH (ref 8–23)
CO2: 22 mmol/L (ref 22–32)
Calcium: 8 mg/dL — ABNORMAL LOW (ref 8.9–10.3)
Chloride: 113 mmol/L — ABNORMAL HIGH (ref 98–111)
Creatinine, Ser: 1.39 mg/dL — ABNORMAL HIGH (ref 0.61–1.24)
GFR, Estimated: 53 mL/min — ABNORMAL LOW (ref >60)
Glucose, Bld: 147 mg/dL — ABNORMAL HIGH (ref 70–99)
Potassium: 4.3 mmol/L (ref 3.5–5.1)
Sodium: 139 mmol/L (ref 135–145)

## 2022-03-22 LAB — CBC
HCT: 32.7 % — ABNORMAL LOW (ref 39.0–52.0)
Hemoglobin: 9.9 g/dL — ABNORMAL LOW (ref 13.0–17.0)
MCH: 27 pg (ref 26.0–34.0)
MCHC: 30.3 g/dL (ref 30.0–36.0)
MCV: 89.3 fL (ref 80.0–100.0)
Platelets: 791 10*3/uL — ABNORMAL HIGH (ref 150–400)
RBC: 3.66 MIL/uL — ABNORMAL LOW (ref 4.22–5.81)
RDW: 17.7 % — ABNORMAL HIGH (ref 11.5–15.5)
WBC: 19.9 10*3/uL — ABNORMAL HIGH (ref 4.0–10.5)
nRBC: 0.4 % — ABNORMAL HIGH (ref 0.0–0.2)

## 2022-03-22 LAB — HEPARIN LEVEL (UNFRACTIONATED)
Heparin Unfractionated: 0.24 IU/mL — ABNORMAL LOW (ref 0.30–0.70)
Heparin Unfractionated: 0.4 IU/mL (ref 0.30–0.70)
Heparin Unfractionated: 0.42 IU/mL (ref 0.30–0.70)

## 2022-03-22 LAB — MAGNESIUM: Magnesium: 2.2 mg/dL (ref 1.7–2.4)

## 2022-03-22 MED ORDER — LORAZEPAM 0.5 MG PO TABS: 0.5000 mg | ORAL_TABLET | ORAL | Status: DC | PRN

## 2022-03-22 MED ORDER — LORAZEPAM 2 MG/ML IJ SOLN: 0.5000 mg | INTRAMUSCULAR | Status: DC | PRN

## 2022-03-22 MED ORDER — HEPARIN BOLUS VIA INFUSION
1100.0000 [IU] | Freq: Once | INTRAVENOUS | Status: AC
  Administered 2022-03-22: 1100 [IU] via INTRAVENOUS
  Filled 2022-03-22: qty 1100

## 2022-03-22 NOTE — Progress Notes (Signed)
Subjective  -   Breathing is better today although he says he gives out of breath quickly and he has no energy   Physical Exam:  Right groin soft Sats 94% on 2L BKA dressing dry      Assessment/Plan:    Clinically improving.  If he has more issues with SOB would consider repeat CTA chest to eval for PE.    Continue with daily dressing changes to left BKA  Phillip [name] 03/22/2022 11:21 AM --  Vitals:   03/22/22 0600 03/22/22 0823  BP: (!) 153/90   Pulse: 78 84  Resp: (!) 25 (!) 21  Temp:  97.7 F (36.5 C)  SpO2: 97% 99%    Intake/Output Summary (Last 24 hours) at 03/22/2022 1121 Last data filed at 03/22/2022 0902 Gross per 24 hour  Intake 808.48 ml  Output 950 ml  Net -141.52 ml     Laboratory CBC    Component Value Date/Time   WBC 19.9 (H) 03/22/2022 0409   HGB 9.9 (L) 03/22/2022 0409   HGB 12.7 (L) 10/25/2014 2206   HCT 32.7 (L) 03/22/2022 0409   HCT 40.9 10/25/2014 2206   PLT 791 (H) 03/22/2022 0409   PLT 188 10/25/2014 2206    BMET    Component Value Date/Time   NA 139 03/22/2022 0409   NA 141 10/25/2014 2206   K 4.3 03/22/2022 0409   K 4.0 10/25/2014 2206   CL 113 (H) 03/22/2022 0409   CL 109 (H) 10/25/2014 2206   CO2 22 03/22/2022 0409   CO2 26 10/25/2014 2206   GLUCOSE 147 (H) 03/22/2022 0409   GLUCOSE 210 (H) 10/25/2014 2206   BUN 28 (H) 03/22/2022 0409   BUN 21 08/15/2016 1618   BUN 15 10/25/2014 2206   CREATININE 1.39 (H) 03/22/2022 0409   CREATININE 1.32 (H) 10/25/2014 2206   CALCIUM 8.0 (L) 03/22/2022 0409   CALCIUM 8.2 (L) 10/25/2014 2206   GFRNONAA 53 (L) 03/22/2022 0409   GFRNONAA 58 (L) 10/25/2014 2206   GFRAA >60 03/27/2019 0637   GFRAA >60 10/25/2014 2206    COAG Lab Results  Component Value Date   INR 1.2 03/11/2022   INR 1.4 (H) 11/21/2021   INR 2.1 (H) 03/26/2021   No results found for: "PTT"  Antibiotics Anti-infectives (From admission, onward)    Start     Dose/Rate Route Frequency Ordered Stop    03/18/22 0943  ceFAZolin (ANCEF) IVPB 2g/100 mL premix        2 g 200 mL/hr over 30 Minutes Intravenous 30 min pre-op 03/18/22 7412 03/18/22 1349   03/17/22 2200  ceFAZolin (ANCEF) IVPB 1 g/50 mL premix       Note to Pharmacy: Send with pt to OR   1 g 100 mL/hr over 30 Minutes Intravenous Every 8 hours 03/17/22 1718 03/18/22 0547   03/16/22 1508  ceFAZolin (ANCEF) 2-4 GM/100ML-% IVPB       Note to Pharmacy: Trudie Reed S: cabinet override      03/16/22 1508 03/16/22 1554   03/16/22 0600  ceFAZolin (ANCEF) IVPB 2g/100 mL premix        2 g 200 mL/hr over 30 Minutes Intravenous On call to O.R. 03/15/22 1315 03/16/22 1544   03/13/22 0600  ceFAZolin (ANCEF) IVPB 2g/100 mL premix       Note to Pharmacy: Send with pt to OR   2 g 200 mL/hr over 30 Minutes Intravenous On call 03/12/22 1948 03/12/22 2017   03/13/22  0400  ceFAZolin (ANCEF) IVPB 2g/100 mL premix        2 g 200 mL/hr over 30 Minutes Intravenous Every 8 hours 03/13/22 0048 03/13/22 1257   03/12/22 0600  ceFAZolin (ANCEF) IVPB 2g/100 mL premix        2 g 200 mL/hr over 30 Minutes Intravenous On call to O.R. 03/11/22 1525 03/11/22 1705   03/11/22 1547  ceFAZolin (ANCEF) 2-4 GM/100ML-% IVPB       Note to Pharmacy: Theadora Rama : cabinet override      03/11/22 1547 03/11/22 1635        V. Leia Alf, M.D., [facility] [place] Office: [phone] Pager:  [phone]

## 2022-03-22 NOTE — Progress Notes (Signed)
Around 15:00, when rolling Phillip [name] to get him off the bed pan and change the underpad he had another hypoxic and elevated work of breathing episode like yesterday. He struggled a lot while turned with his right side down (no difficulties like this in the morning when we only turned to the left and even during the same event we started with a turn to his left and he was not struggling) and did not seem to be able to catch his breath until he turned onto his back. Patient reported after the event that he was not certain if it was turning to the right side or if it was just that he had to do more turning to get the new pads through that previous activities during the day. He reported he had no pain but that he was unable to catch his breath. Dr. Roosevelt Locks notified and personally came to assess patient again. Since patient did report extreme anxiety when movement, Dr. Roosevelt Locks ordered a prn medication for anxiety for patient.

## 2022-03-22 NOTE — Progress Notes (Signed)
ANTICOAGULATION CONSULT NOTE  Pharmacy Consult for Heparin Infusion Indication: pulmonary embolus  Patient Measurements: Height: '5\' 9"'$  (175.3 cm) Weight: 73.1 kg (161 lb 2.5 oz) IBW/kg (Calculated) : 70.7 Heparin Dosing Weight: 79.3 kg  Labs: Recent Labs    03/20/22 0805 03/20/22 1543 03/21/22 0135 03/21/22 0949 03/21/22 2008 03/22/22 0409 03/22/22 1352  HGB 9.3*  --  9.4*  --   --  9.9*  --   HCT 29.6*  --  30.4*  --   --  32.7*  --   PLT 581*  --  685*  --   --  791*  --   APTT 102* 103* 81* 65*  --   --   --   HEPARINUNFRC 0.41  --   --  0.31 0.28* 0.24* 0.40  CREATININE 1.35*  --   --   --   --  1.39*  --      Estimated Creatinine Clearance: 45.9 mL/min (A) (by C-G formula based on SCr of 1.39 mg/dL (H)).   Medical History: Past Medical History:  Diagnosis Date   Arthritis    Basal cell carcinoma    L clavicle, L prox forearm- removed years ago    Bladder cancer (HCC)    Bladder neck contracture    CAD (coronary artery disease)    a. 12/2020 NSTEMI/Cath: LM nl, LAD 95ost/p, LCX 50p, RCA nl. EF 45-50%.   Cataract    CKD (chronic kidney disease), stage III [facility])    ED (erectile dysfunction)    Frequency    GERD (gastroesophageal reflux disease)    History of pulmonary embolus (PE) 11/2018   a. Following LE DVT-->Chronic warfarin.   Hyperlipidemia LDL goal <70    Hypertension    Hypothyroidism    Incontinence of urine    sui, s/p cryoablation   Ischemic cardiomyopathy    a. 11/2018 Echo: EF 60-65%; b. 12/2020 LV Gram: EF 45-50% in setting of NSTEMI.   Kidney stones    Neuropathy    feet   Nocturia    Prostate cancer (HCC)    S/P   CRYOABLATION   Right elbow tendinitis    Right Lower Extremity DVT (deep venous thrombosis) (HCC) 11/25/2018   Vertigo    1-2x/yr   Wears glasses     Assessment: 76 y.o. 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 Patient recently having discontinued this about 1 week ago in preparation for  placement of an artificial urinary sphincter procedure on on 03/09/22 and was instructed to continue to hold Eliquis 48 hours post-op. Presents to ED on 03/11/22 with pain and duskiness in the left foot extending towards the left calf. Pharmacy has been consulted for heparin infusion for VTE.   S/P thrombectomy 6/23, additional thromboembolectomy on 6/24 PM.  Pt was on heparin gtt since 6/24 @ ~ 2000. Pt was on Eliquis 10 mg PO BID. On 6/26 he was transitioned to aggrastat drip x 18 hrs with the hope for improving his forefoot perfusion, which was completed then transitioned to apixaban with last dose administered 0904 03/17/22 now with a new PE   7/2 0805 aPTT= 102 Therapeutic 7/2 1611 aPTT=103  Supratherapeutic, decrease from 1100 units/hr to 1000 units/hr 7/3 0135 aPTT = 81, therapeutic x 1 7/3 0949 aPTT = 65, HL 0.31, therapeutic at lower limit of range 7/3 2008 HL 0.28, subtherapeutic 7/4 0409 HL 0.24, SUBtherapeutic  7/4 1352 HL 0.40,  therapeutic x1 cont at 1350 units/hr  Goal  of Therapy:  Heparin level 0.3-0.7 units/ml Monitor platelets by anticoagulation protocol: Yes   Plan:  7/4 1352 HL 0.40,  therapeutic Will continue drip at 1350 units/hr.  Will check confirmatory HL in 8 hrs. CBC daily  Bobbi Kozakiewicz A  03/22/2022 3:57 PM

## 2022-03-22 NOTE — Progress Notes (Signed)
   03/22/22 1800  Clinical Encounter Type  Visited With Patient and family together  Visit Type Follow-up   Chaplain provided follow up visit.

## 2022-03-22 NOTE — Progress Notes (Signed)
[facility] Cardiology  SUBJECTIVE:  -Attempted to get in the chair twice yesterday, was able to do so in the afternoon no significant shortness of breath and hypoxia. - This morning states he feels "washed out." - Continues to have shortness of breath with minimal exertion.    Vitals:   03/22/22 0400 03/22/22 0500 03/22/22 0600 03/22/22 0823  BP: (!) 148/75 (!) 164/89 (!) 153/90   Pulse: 77 80 78 84  Resp: (!) 24 (!) 25 (!) 25 (!) 21  Temp: 98 F (36.7 C)   97.7 F (36.5 C)  TempSrc: Oral   Oral  SpO2: 98% 93% 97% 99%  Weight:      Height:         Intake/Output Summary (Last 24 hours) at 03/22/2022 0843 Last data filed at 03/22/2022 0600 Gross per 24 hour  Intake 838.46 ml  Output 950 ml  Net -111.54 ml       PHYSICAL EXAM  General: Well developed, well nourished, in no acute distress HEENT:  Normocephalic and atramatic Neck:  No JVD.  Lungs: Clear bilaterally to auscultation and percussion. On  O2. Heart: HRRR . Normal S1 and S2 without gallops or murmurs.  Abdomen: Bowel sounds are positive, abdomen soft and non-tender  Msk:  Back normal, normal gait. Normal strength and tone for age. Extremities: s/p L BKA Neuro: Alert and oriented X 3. Psych:  Good affect, responds appropriately   LABS: Basic Metabolic Panel: Recent Labs    03/20/22 0805 03/22/22 0409  NA 137 139  K 4.3 4.3  CL 105 113*  CO2 24 22  GLUCOSE 127* 147*  BUN 28* 28*  CREATININE 1.35* 1.39*  CALCIUM 8.3* 8.0*  MG 2.4 2.2    Liver Function Tests: No results for input(s): "AST", "ALT", "ALKPHOS", "BILITOT", "PROT", "ALBUMIN" in the last 72 hours. No results for input(s): "LIPASE", "AMYLASE" in the last 72 hours. CBC: Recent Labs    03/21/22 0135 03/22/22 0409  WBC 17.9* 19.9*  HGB 9.4* 9.9*  HCT 30.4* 32.7*  MCV 89.1 89.3  PLT 685* 791*    Cardiac Enzymes: No results for input(s): "CKTOTAL", "CKMB", "CKMBINDEX", "TROPONINI" in the last 72 hours. BNP: Invalid input(s):  "POCBNP" D-Dimer: No results for input(s): "DDIMER" in the last 72 hours. Hemoglobin A1C: No results for input(s): "HGBA1C" in the last 72 hours. Fasting Lipid Panel: No results for input(s): "CHOL", "HDL", "LDLCALC", "TRIG", "CHOLHDL", "LDLDIRECT" in the last 72 hours. Thyroid Function Tests: No results for input(s): "TSH", "T4TOTAL", "T3FREE", "THYROIDAB" in the last 72 hours.  Invalid input(s): "FREET3" Anemia Panel: Recent Labs    03/20/22 0805  VITAMINB12 431  FERRITIN 66  TIBC 192*  IRON 22*     No results found.   Echo LVEF 60 to 65%, thrombus noted in right atrium and right ventricle (prior to mechanical thrombectomy), 03/18/2022  TELEMETRY: Sinus rhythm 80 bpm:  ASSESSMENT AND PLAN:  Principal Problem:   Arterial occlusion Active Problems:   Bilateral pulmonary embolism (HCC)   HTN (hypertension)   History of prostate cancer   Coronary artery disease   AKI (acute kidney injury) ([place])   Critical limb ischemia of left lower extremity with ulceration of lower leg (HCC)   Acute respiratory failure with hypoxia (HCC)   Acute on chronic diastolic CHF (congestive heart failure) (HCC)   PSVT (paroxysmal supraventricular tachycardia) (HCC)   Paroxysmal atrial fibrillation with RVR (HCC)   Acute bilateral deep vein thrombosis (DVT) of femoral veins (HCC)   Reactive thrombocytosis  1. 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  Submassive pulmonary embolism, status post mechanical thrombectomy 03/18/2022, status post IVC filter 03/19/2022 3.  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  Elevated troponin, likely demand supply ischemia, secondary to submassive bilateral pulmonary emboli 5.  CAD, history of NSTEMI status post DES proximal LAD 12/2020 6. 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 Severe RV dysfunction by echo  03/18/22 8. Acute hypoxic respiratory failure- Persistent following thromectomy, but improving; on 4L O2 on 03/22/22   Recommendations   1.  Agree with current therapy 2.  Continue heparin infusion with plan to transition back to Eliquis; defer to vascular surgery regarding timing.  3.  Continue metoprolol tartrate at current dose 4.  Continue clopidogrel 5.  Continue pravastatin   Andrez Grime, MD 03/22/2022 8:43 AM

## 2022-03-22 NOTE — Progress Notes (Signed)
Pts oxygen saturation decreases to the 70's whenever he gets on the bedpan or with minimal exertion.

## 2022-03-22 NOTE — Progress Notes (Signed)
Progress Note   Patient: Phillip [name] DXA:[id number] DOB: [birth year] DOA: 03/11/2022     11 DOS: the patient was seen and examined on 03/22/2022   Brief hospital course: KAPIL PETROPOULOS is a 76 y.o. male with medical history significant for recurrent DVTs, post operative PE on Eliquis, unspecified thrombophilia, hypertension, hyperlipidemia, nephrolithiasis, malignant neoplasm of prostate post resection, bladder cancer, osteoarthritis, coronary artery disease status post PCI with stenting on Plavix, chronic diastolic CHF, who presents to [facility] ED from his PCP's office, Dr. Sabra Heck, due to concern for left critical lower limb ischemia.   Patient is seen by vascular surgery, had a lower extremity bypass surgery on 6/23.  Went back to the OR on 6/24 for L LE thrombectomy.  Patient also placed on heparin drip.  It appears that the patient left leg is nonsalvageable.  BKA scheduled. 6/28.  Developed episode of PSVT, hypoxia.  Appears to be flash pulm edema, given IV Lasix. BKA performed on 6/28. 6/30.  Patient developed hypoxia and tachycardia again with atrial fibrillation.  CT angiogram was performed 6/29 showed bilateral PE.  Thrombectomy performed on 6/30, heparin drip restarted. 7/1.  Patient had another episode of hypoxemia, requiring high flow oxygen.  Duplex ultrasound showed bilateral DVT, IVC filter placed.  Assessment and Plan: Acute hypoxemic respiratory failure Bilateral pulmonary emboli. Bilateral lower extremity DVT. Flash pulmonary edema secondary to acute on chronic diastolic congestive heart failure. Paroxysmal A fib Patient is status post pulmonary thrombectomy.  IVC filter placed 7/1 due to bilateral lower extremity DVT. Patient appears to have recurrent PE even on therapeutic heparin drip.  As result, IVC filter is placed. Due to recurrent thromboembolic event, patient will be treated with IV heparin until respiratory status improved and no vascular surgery procedure is  needed Have you been talking with vascular surgery, recommend to continue current treatment.  However, patient still have significant short of breath with minimal exertion, has evidence of pulmonary hypertension.  Patient still have a large burden of PE, will need to talk with Dr. Lucky Cowboy on Wednesday, patient may benefit from repeated pulmonary thrombectomy. I will continue keep patient in stepdown unit anticipating vascular surgery procedure. Oxygenation is improving. I will start OT today, if tolerated well, may start PT tomorrow.   Insomnia. Has not been able to sleep in the hospital, melatonin does not seem to work.  I will start lower dose Seroquel.  Reviewed patient EKG, no significant QT interval prolongation. Patient slept better last night.  Constipation. Condition improved.   Elevated troponin Appreciate cardiology consult.  Appear to be secondary to large PE with heart strain/   Left lower extremity ischemia.  Status post BKA Failed effort with revascularization and thrombectomy. Start OT today.   History of bladder cancer s/p resection. History of prostate cancer. Urinary stress incontinence.  Status post artificial urinary sphincter on 6/21. No new issues     Acute kidney injury. Relatively stable, continue to follow  Coronary artery disease status post PCI and stenting. Continue Plavix, statin.   Reactive thrombocytosis. Acquired hemophilia. Continue heparin drip.    Essential hypertension. Continue metoprolol.       Subjective:  Still has significant short of breath with minimal exertion, on 2 L oxygen.  No cough. Slept better last night. Has been having daily bowel movement.  Appetite improving.  Physical Exam: Vitals:   03/22/22 0400 03/22/22 0500 03/22/22 0600 03/22/22 0823  BP: (!) 148/75 (!) 164/89 (!) 153/90   Pulse: 77 80 78 84  Resp: (!) 24 (!) 25 (!) 25 (!) 21  Temp: 98 F (36.7 C)   97.7 F (36.5 C)  TempSrc: Oral   Oral  SpO2: 98% 93%  97% 99%  Weight:      Height:       General exam: Appears calm and comfortable  Respiratory system: Clear to auscultation. Respiratory effort normal. Cardiovascular system: S1 & S2 heard, RRR. P2> A2. No JVD, murmurs, rubs, gallops or clicks. No pedal edema. Gastrointestinal system: Abdomen is nondistended, soft and nontender. No organomegaly or masses felt. Normal bowel sounds heard. Central nervous system: Alert and oriented. No focal neurological deficits. Extremities: Left BKA. Skin: No rashes, lesions or ulcers Psychiatry: Judgement and insight appear normal. Mood & affect appropriate.   Data Reviewed:  Lab results reviewed.  Family Communication:   Disposition: Status is: Inpatient Remains inpatient appropriate because: Severity of disease,  Planned Discharge Destination: Skilled nursing facility    Time spent: 50 minutes  Author: Sharen Hones, MD 03/22/2022 12:01 PM  For on call review [email or web address].

## 2022-03-22 NOTE — Progress Notes (Signed)
ANTICOAGULATION CONSULT NOTE  Pharmacy Consult for Heparin Infusion Indication: pulmonary embolus  Patient Measurements: Height: '5\' 9"'$  (175.3 cm) Weight: 73.1 kg (161 lb 2.5 oz) IBW/kg (Calculated) : 70.7 Heparin Dosing Weight: 79.3 kg  Labs: Recent Labs    03/20/22 0805 03/20/22 1543 03/21/22 0135 03/21/22 0949 03/21/22 2008 03/22/22 0409 03/22/22 1352 03/22/22 2150  HGB 9.3*  --  9.4*  --   --  9.9*  --   --   HCT 29.6*  --  30.4*  --   --  32.7*  --   --   PLT 581*  --  685*  --   --  791*  --   --   APTT 102* 103* 81* 65*  --   --   --   --   HEPARINUNFRC 0.41  --   --  0.31   < > 0.24* 0.40 0.42  CREATININE 1.35*  --   --   --   --  1.39*  --   --    < > = values in this interval not displayed.     Estimated Creatinine Clearance: 45.9 mL/min (A) (by C-G formula based on SCr of 1.39 mg/dL (H)).   Medical History: Past Medical History:  Diagnosis Date   Arthritis    Basal cell carcinoma    L clavicle, L prox forearm- removed years ago    Bladder cancer (HCC)    Bladder neck contracture    CAD (coronary artery disease)    a. 12/2020 NSTEMI/Cath: LM nl, LAD 95ost/p, LCX 50p, RCA nl. EF 45-50%.   Cataract    CKD (chronic kidney disease), stage III [facility])    ED (erectile dysfunction)    Frequency    GERD (gastroesophageal reflux disease)    History of pulmonary embolus (PE) 11/2018   a. Following LE DVT-->Chronic warfarin.   Hyperlipidemia LDL goal <70    Hypertension    Hypothyroidism    Incontinence of urine    sui, s/p cryoablation   Ischemic cardiomyopathy    a. 11/2018 Echo: EF 60-65%; b. 12/2020 LV Gram: EF 45-50% in setting of NSTEMI.   Kidney stones    Neuropathy    feet   Nocturia    Prostate cancer (HCC)    S/P   CRYOABLATION   Right elbow tendinitis    Right Lower Extremity DVT (deep venous thrombosis) (HCC) 11/25/2018   Vertigo    1-2x/yr   Wears glasses     Assessment: 76 y.o. male with past medical history of PE and recurrent DVTs on  Eliquis. Patient recently having discontinued this about 1 week ago in preparation for placement of an artificial urinary sphincter procedure on on 03/09/22 and was instructed to continue to hold Eliquis 48 hours post-op. Presents to ED on 03/11/22 with pain and duskiness in the left foot extending towards the left calf. Pharmacy has been consulted for heparin infusion for VTE.   S/P thrombectomy 6/23, additional thromboembolectomy on 6/24 PM.  Pt was on heparin gtt since 6/24 @ ~ 2000. Pt was on Eliquis 10 mg PO BID. On 6/26 he was transitioned to aggrastat drip x 18 hrs with the hope for improving his forefoot perfusion, which was completed then transitioned to apixaban with last dose administered 0904 03/17/22 now with a new PE   7/2 0805 aPTT= 102 Therapeutic 7/2 1611 aPTT=103  Supratherapeutic, decrease from 1100 units/hr to 1000 units/hr 7/3 0135 aPTT = 81, therapeutic x 1 7/3 0949 aPTT = 65,  HL 0.31, therapeutic at lower limit of range 7/3 2008 HL 0.28, subtherapeutic 7/4 0409 HL 0.24, SUBtherapeutic  7/4 1352 HL 0.40,  therapeutic x1 cont at 1350 units/hr 7/4 2150 HL 0.42, therapeutic X 2   Goal of Therapy:  Heparin level 0.3-0.7 units/ml Monitor platelets by anticoagulation protocol: Yes   Plan:  7/4:  HL @ 2150 = 0.42, therapeutic X 2 Will continue pt on current rate and recheck HL on 7/5 with AM labs.   Phillip [name] D  03/22/2022 10:15 PM

## 2022-03-22 NOTE — Progress Notes (Signed)
ANTICOAGULATION CONSULT NOTE  Pharmacy Consult for Heparin Infusion Indication: pulmonary embolus  Patient Measurements: Height: '5\' 9"'$  (175.3 cm) Weight: 73.1 kg (161 lb 2.5 oz) IBW/kg (Calculated) : 70.7 Heparin Dosing Weight: 79.3 kg  Labs: Recent Labs    03/20/22 0805 03/20/22 1543 03/21/22 0135 03/21/22 0949 03/21/22 2008 03/22/22 0409  HGB 9.3*  --  9.4*  --   --  9.9*  HCT 29.6*  --  30.4*  --   --  32.7*  PLT 581*  --  685*  --   --  791*  APTT 102* 103* 81* 65*  --   --   HEPARINUNFRC 0.41  --   --  0.31 0.28* 0.24*  CREATININE 1.35*  --   --   --   --  1.39*     Estimated Creatinine Clearance: 45.9 mL/min (A) (by C-G formula based on SCr of 1.39 mg/dL (H)).   Medical History: Past Medical History:  Diagnosis Date   Arthritis    Basal cell carcinoma    L clavicle, L prox forearm- removed years ago    Bladder cancer (HCC)    Bladder neck contracture    CAD (coronary artery disease)    a. 12/2020 NSTEMI/Cath: LM nl, LAD 95ost/p, LCX 50p, RCA nl. EF 45-50%.   Cataract    CKD (chronic kidney disease), stage III [facility])    ED (erectile dysfunction)    Frequency    GERD (gastroesophageal reflux disease)    History of pulmonary embolus (PE) 11/2018   a. Following LE DVT-->Chronic warfarin.   Hyperlipidemia LDL goal <70    Hypertension    Hypothyroidism    Incontinence of urine    sui, s/p cryoablation   Ischemic cardiomyopathy    a. 11/2018 Echo: EF 60-65%; b. 12/2020 LV Gram: EF 45-50% in setting of NSTEMI.   Kidney stones    Neuropathy    feet   Nocturia    Prostate cancer (HCC)    S/P   CRYOABLATION   Right elbow tendinitis    Right Lower Extremity DVT (deep venous thrombosis) (HCC) 11/25/2018   Vertigo    1-2x/yr   Wears glasses     Assessment: 76 y.o. 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 Patient recently having discontinued this about 1 week ago in preparation for placement of an artificial urinary sphincter  procedure on on 03/09/22 and was instructed to continue to hold Eliquis 48 hours post-op. Presents to ED on 03/11/22 with pain and duskiness in the left foot extending towards the left calf. Pharmacy has been consulted for heparin infusion for VTE.   S/P thrombectomy 6/23, additional thromboembolectomy on 6/24 PM.  Pt was on heparin gtt since 6/24 @ ~ 2000. Pt was on Eliquis 10 mg PO BID. On 6/26 he was transitioned to aggrastat drip x 18 hrs with the hope for improving his forefoot perfusion, which was completed then transitioned to apixaban with last dose administered 0904 03/17/22 now with a new PE   7/2 0805 aPTT= 102 Therapeutic 7/2 1611 aPTT=103  Supratherapeutic, decrease from 1100 units/hr to 1000 units/hr 7/3 0135 aPTT = 81, therapeutic x 1 7/3 0949 aPTT = 65, HL 0.31, therapeutic at lower limit of range 7/3 2008 HL 0.28, subtherapeutic 7/4 0409 HL 0.24, SUBtherapeutic   Goal of Therapy:  Heparin level 0.3-0.7 units/ml Monitor platelets by anticoagulation protocol: Yes   Plan:  7/4:  HL @ 0409 = 0.24, subtherapeutic Will order heparin 1100  units IV X 1 and increase drip rate 1350 units/hr.  Will recheck HL 8 hrs after rate change.  Phillip [name] D  03/22/2022 5:20 AM

## 2022-03-23 ENCOUNTER — Encounter
Admission: EM | Disposition: A | Discharge status: Skilled Nursing Facility | Payer: Self-pay | Source: Ambulatory Visit | Attending: Internal Medicine

## 2022-03-23 ENCOUNTER — Encounter: Payer: Self-pay | Admitting: Surgery

## 2022-03-23 ENCOUNTER — Inpatient Hospital Stay: Payer: Medicare HMO

## 2022-03-23 DIAGNOSIS — I2609 Other pulmonary embolism with acute cor pulmonale: Secondary | ICD-10-CM | POA: Diagnosis not present

## 2022-03-23 DIAGNOSIS — I82220 Acute embolism and thrombosis of inferior vena cava: Secondary | ICD-10-CM

## 2022-03-23 DIAGNOSIS — I82421 Acute embolism and thrombosis of right iliac vein: Secondary | ICD-10-CM

## 2022-03-23 DIAGNOSIS — Z7189 Other specified counseling: Secondary | ICD-10-CM

## 2022-03-23 HISTORY — PX: PULMONARY THROMBECTOMY: CATH118295

## 2022-03-23 LAB — CBC
HCT: 30.4 % — ABNORMAL LOW (ref 39.0–52.0)
Hemoglobin: 9.2 g/dL — ABNORMAL LOW (ref 13.0–17.0)
MCH: 27.2 pg (ref 26.0–34.0)
MCHC: 30.3 g/dL (ref 30.0–36.0)
MCV: 89.9 fL (ref 80.0–100.0)
Platelets: 768 10*3/uL — ABNORMAL HIGH (ref 150–400)
RBC: 3.38 MIL/uL — ABNORMAL LOW (ref 4.22–5.81)
RDW: 17.6 % — ABNORMAL HIGH (ref 11.5–15.5)
WBC: 20.2 10*3/uL — ABNORMAL HIGH (ref 4.0–10.5)
nRBC: 0.5 % — ABNORMAL HIGH (ref 0.0–0.2)

## 2022-03-23 LAB — BASIC METABOLIC PANEL
Anion gap: 6 (ref 5–15)
BUN: 30 mg/dL — ABNORMAL HIGH (ref 8–23)
CO2: 22 mmol/L (ref 22–32)
Calcium: 8.2 mg/dL — ABNORMAL LOW (ref 8.9–10.3)
Chloride: 111 mmol/L (ref 98–111)
Creatinine, Ser: 1.57 mg/dL — ABNORMAL HIGH (ref 0.61–1.24)
GFR, Estimated: 46 mL/min — ABNORMAL LOW (ref >60)
Glucose, Bld: 135 mg/dL — ABNORMAL HIGH (ref 70–99)
Potassium: 4.3 mmol/L (ref 3.5–5.1)
Sodium: 139 mmol/L (ref 135–145)

## 2022-03-23 LAB — HEPARIN LEVEL (UNFRACTIONATED): Heparin Unfractionated: 0.47 IU/mL (ref 0.30–0.70)

## 2022-03-23 LAB — MAGNESIUM: Magnesium: 2.2 mg/dL (ref 1.7–2.4)

## 2022-03-23 LAB — GLUCOSE, CAPILLARY
Glucose-Capillary: 136 mg/dL — ABNORMAL HIGH (ref 70–99)
Glucose-Capillary: 134 mg/dL — ABNORMAL HIGH (ref 70–99)
Glucose-Capillary: 192 mg/dL — ABNORMAL HIGH (ref 70–99)

## 2022-03-23 SURGERY — PULMONARY THROMBECTOMY
Anesthesia: Moderate Sedation | Laterality: Bilateral

## 2022-03-23 MED ORDER — IODIXANOL 320 MG/ML IV SOLN
INTRAVENOUS | Status: DC | PRN
  Administered 2022-03-23: 95 mL via INTRA_ARTERIAL

## 2022-03-23 MED ORDER — MIDAZOLAM HCL 5 MG/5ML IJ SOLN
INTRAMUSCULAR | Status: AC
  Filled 2022-03-23: qty 5

## 2022-03-23 MED ORDER — SODIUM CHLORIDE 0.9 % IV SOLN
0.1000 mg/kg/h | INTRAVENOUS | Status: DC
  Filled 2022-03-23: qty 250

## 2022-03-23 MED ORDER — BIVALIRUDIN BOLUS VIA INFUSION
0.7500 mg/kg | Freq: Once | INTRAVENOUS | Status: DC
  Administered 2022-03-23: 54.9 mg via INTRAVENOUS
  Filled 2022-03-23: qty 55

## 2022-03-23 MED ORDER — SODIUM CHLORIDE 0.9 % IV SOLN
0.1000 mg/kg/h | INTRAVENOUS | Status: DC
  Administered 2022-03-23: 0.1 mg/kg/h via INTRAVENOUS
  Filled 2022-03-23 (×2): qty 250

## 2022-03-23 MED ORDER — FENTANYL CITRATE (PF) 100 MCG/2ML IJ SOLN
INTRAMUSCULAR | Status: DC | PRN
  Administered 2022-03-23: 50 ug via INTRAVENOUS

## 2022-03-23 MED ORDER — BIVALIRUDIN TRIFLUOROACETATE 250 MG IV SOLR: 0.1000 mg/kg/h | INTRAVENOUS | Status: DC

## 2022-03-23 MED ORDER — FENTANYL CITRATE PF 50 MCG/ML IJ SOSY
PREFILLED_SYRINGE | INTRAMUSCULAR | Status: AC
  Filled 2022-03-23: qty 2

## 2022-03-23 MED ORDER — CHLORHEXIDINE GLUCONATE CLOTH 2 % EX PADS
6.0000 | MEDICATED_PAD | Freq: Every day | CUTANEOUS | Status: DC
  Administered 2022-03-24 – 2022-03-26 (×3): 6 via TOPICAL

## 2022-03-23 MED ORDER — CEFAZOLIN SODIUM-DEXTROSE 2-4 GM/100ML-% IV SOLN
2.0000 g | INTRAVENOUS | Status: AC
  Administered 2022-03-23: 2 g via INTRAVENOUS
  Filled 2022-03-23: qty 100

## 2022-03-23 MED ORDER — HEPARIN SODIUM (PORCINE) 1000 UNIT/ML IJ SOLN
INTRAMUSCULAR | Status: AC
  Filled 2022-03-23: qty 10

## 2022-03-23 MED ORDER — BIVALIRUDIN TRIFLUOROACETATE 250 MG IV SOLR
INTRAVENOUS | Status: AC
  Filled 2022-03-23: qty 250

## 2022-03-23 MED ORDER — MIDAZOLAM HCL 2 MG/2ML IJ SOLN
INTRAMUSCULAR | Status: DC | PRN
  Administered 2022-03-23: 1 mg via INTRAVENOUS

## 2022-03-23 MED ORDER — IOHEXOL 350 MG/ML SOLN
75.0000 mL | Freq: Once | INTRAVENOUS | Status: AC | PRN
  Administered 2022-03-23: 75 mL via INTRAVENOUS

## 2022-03-23 MED ORDER — CEFAZOLIN SODIUM-DEXTROSE 1-4 GM/50ML-% IV SOLN
INTRAVENOUS | Status: DC | PRN
  Administered 2022-03-23: 2 g via INTRAVENOUS

## 2022-03-23 MED ORDER — ARGATROBAN 50 MG/50ML IV SOLN
0.5000 ug/kg/min | INTRAVENOUS | Status: AC
  Administered 2022-03-23 – 2022-03-26 (×4): 0.5 ug/kg/min via INTRAVENOUS
  Filled 2022-03-23 (×4): qty 50

## 2022-03-23 SURGICAL SUPPLY — 21 items
CANISTER PENUMBRA ENGINE (MISCELLANEOUS) ×1 IMPLANT
CATH ANGIO 5F PIGTAIL 100CM (CATHETERS) ×2 IMPLANT
CATH BEACON 5 .035 65 KMP TIP (CATHETERS) ×1 IMPLANT
CATH INDIGO 12XTORQ 100 (CATHETERS) ×1 IMPLANT
CATH INDIGO SEP 12 (CATHETERS) ×2 IMPLANT
CATH INFINITI JR4 5F (CATHETERS) ×1 IMPLANT
CATH SELECT BERN TIP 5F 130 (CATHETERS) ×1 IMPLANT
COVER PROBE U/S 5X48 (MISCELLANEOUS) ×1 IMPLANT
DEVICE SAFEGUARD 24CM (GAUZE/BANDAGES/DRESSINGS) ×1 IMPLANT
GLIDEWIRE ADV .035X260CM (WIRE) ×1 IMPLANT
NDL ENTRY 21GA 7CM ECHOTIP (NEEDLE) IMPLANT
NEEDLE ENTRY 21GA 7CM ECHOTIP (NEEDLE) ×2 IMPLANT
PACK ANGIOGRAPHY (CUSTOM PROCEDURE TRAY) ×2 IMPLANT
SET INTRO CAPELLA COAXIAL (SET/KITS/TRAYS/PACK) ×1 IMPLANT
SHEATH 9FRX11 (SHEATH) ×1 IMPLANT
SHEATH ANSEL 12FRX45CM (SHEATH) ×1 IMPLANT
SHEATH PINNACLE 11FRX10 (SHEATH) ×1 IMPLANT
SUT SILK 0 FSL (SUTURE) ×2 IMPLANT
SYR MEDRAD MARK 7 150ML (SYRINGE) ×1 IMPLANT
TUBING CONTRAST HIGH PRESS 72 (TUBING) ×1 IMPLANT
WIRE AMPLATZ SSTIFF .035X260CM (WIRE) ×1 IMPLANT

## 2022-03-23 NOTE — Progress Notes (Signed)
ANTICOAGULATION CONSULT NOTE  Pharmacy Consult for Heparin Infusion Indication: pulmonary embolus  Patient Measurements: Height: '5\' 9"'$  (175.3 cm) Weight: 73.1 kg (161 lb 2.5 oz) IBW/kg (Calculated) : 70.7 Heparin Dosing Weight: 79.3 kg  Labs: Recent Labs    03/20/22 0805 03/20/22 1543 03/21/22 0135 03/21/22 0949 03/21/22 2008 03/22/22 0409 03/22/22 1352 03/22/22 2150 03/23/22 0501  HGB 9.3*  --  9.4*  --   --  9.9*  --   --  9.2*  HCT 29.6*  --  30.4*  --   --  32.7*  --   --  30.4*  PLT 581*  --  685*  --   --  791*  --   --  768*  APTT 102* 103* 81* 65*  --   --   --   --   --   HEPARINUNFRC 0.41  --   --  0.31   < > 0.24* 0.40 0.42 0.47  CREATININE 1.35*  --   --   --   --  1.39*  --   --  1.57*   < > = values in this interval not displayed.     Estimated Creatinine Clearance: 40.7 mL/min (A) (by C-G formula based on SCr of 1.57 mg/dL (H)).   Medical History: Past Medical History:  Diagnosis Date   Arthritis    Basal cell carcinoma    L clavicle, L prox forearm- removed years ago    Bladder cancer (HCC)    Bladder neck contracture    CAD (coronary artery disease)    a. 12/2020 NSTEMI/Cath: LM nl, LAD 95ost/p, LCX 50p, RCA nl. EF 45-50%.   Cataract    CKD (chronic kidney disease), stage III [facility])    ED (erectile dysfunction)    Frequency    GERD (gastroesophageal reflux disease)    History of pulmonary embolus (PE) 11/2018   a. Following LE DVT-->Chronic warfarin.   Hyperlipidemia LDL goal <70    Hypertension    Hypothyroidism    Incontinence of urine    sui, s/p cryoablation   Ischemic cardiomyopathy    a. 11/2018 Echo: EF 60-65%; b. 12/2020 LV Gram: EF 45-50% in setting of NSTEMI.   Kidney stones    Neuropathy    feet   Nocturia    Prostate cancer (HCC)    S/P   CRYOABLATION   Right elbow tendinitis    Right Lower Extremity DVT (deep venous thrombosis) (HCC) 11/25/2018   Vertigo    1-2x/yr   Wears glasses     Assessment: 76 y.o. male with past  medical history of PE and recurrent DVTs on Eliquis. Patient recently having discontinued this about 1 week ago in preparation for placement of an artificial urinary sphincter procedure on on 03/09/22 and was instructed to continue to hold Eliquis 48 hours post-op. Presents to ED on 03/11/22 with pain and duskiness in the left foot extending towards the left calf. Pharmacy has been consulted for heparin infusion for VTE.   S/P thrombectomy 6/23, additional thromboembolectomy on 6/24 PM.  Pt was on heparin gtt since 6/24 @ ~ 2000. Pt was on Eliquis 10 mg PO BID. On 6/26 he was transitioned to aggrastat drip x 18 hrs with the hope for improving his forefoot perfusion, which was completed then transitioned to apixaban with last dose administered 0904 03/17/22 now with a new PE   7/2 0805 aPTT= 102 Therapeutic 7/2 1611 aPTT=103  Supratherapeutic, decrease from 1100 units/hr to 1000 units/hr 7/3 0135 aPTT =  81, therapeutic x 1 7/3 0949 aPTT = 65, HL 0.31, therapeutic at lower limit of range 7/3 2008 HL 0.28, subtherapeutic 7/4 0409 HL 0.24, SUBtherapeutic  7/4 1352 HL 0.40,  therapeutic x1 cont at 1350 units/hr 7/4 2150 HL 0.42, therapeutic X 2  7/5 0501 HL 0.47, therapeutic X 3  Goal of Therapy:  Heparin level 0.3-0.7 units/ml Monitor platelets by anticoagulation protocol: Yes   Plan:  7/5:  HL @ 0501 = 0.47, therapeutic X 3 Will continue pt on current rate and recheck HL on 7/6 with AM labs.   Breckyn Troyer D  03/23/2022 6:01 AM

## 2022-03-23 NOTE — Progress Notes (Signed)
OT Cancellation Note  Patient Details Name: Phillip [name] MRN: [id number] DOB: 1946/05/21   Cancelled Treatment:    Reason Eval/Treat Not Completed: Medical issues which prohibited therapy. Per secure chat, MD requesting therapy hold today and attempt re-evaluation tomorrow.   Darleen Crocker, MS, OTR/L , CBIS ascom [phone]  03/23/22, 9:11 AM

## 2022-03-23 NOTE — Progress Notes (Signed)
[facility]  SUBJECTIVE:  - feels anxious and like he's not progressing - becomes sob with exertion, although oxygen requirement has decreased from 4L to 2L today - remains in sinus rhythm with rates in the 80s-90s on telemetry   Vitals:   03/23/22 0700 03/23/22 0800 03/23/22 0900 03/23/22 1000  BP: 114/78 132/73 116/67 117/71  Pulse: 75 76 82 80  Resp: 20 (!) 21 (!) 25 (!) 24  Temp:  (!) 97.4 F (36.3 C)    TempSrc:  Oral    SpO2: 100% 100% 100% 100%  Weight:      Height:         Intake/Output Summary (Last 24 hours) at 03/23/2022 1102 Last data filed at 03/23/2022 1000 Gross per 24 hour  Intake 789.57 ml  Output 1300 ml  Net -510.43 ml     PHYSICAL EXAM  General: Elderly Caucasian male, well nourished, in no acute distress.  Sitting upright in ICU bed with his son and other male visitor at bedside. HEENT:  Normocephalic and atraumatic. Neck:   No JVD.  Lungs: Normal respiratory effort on oxygen by nasal cannula. Clear bilaterally to auscultation. No wheezes, crackles, rhonchi.  Heart: HRRR . Normal S1 and S2 without gallops or murmurs.  Abdomen: Non-distended appearing.  Msk: Normal strength and tone for age. Extremities: S/p left BKA Neuro: Alert and oriented X 3. Psych:  Answers questions appropriately. Occasionally tearful.   LABS: Basic Metabolic Panel: Recent Labs    03/22/22 0409 03/23/22 0501  NA 139 139  K 4.3 4.3  CL 113* 111  CO2 22 22  GLUCOSE 147* 135*  BUN 28* 30*  CREATININE 1.39* 1.57*  CALCIUM 8.0* 8.2*  MG 2.2 2.2    Liver Function Tests: No results for input(s): "AST", "ALT", "ALKPHOS", "BILITOT", "PROT", "ALBUMIN" in the last 72 hours. No results for input(s): "LIPASE", "AMYLASE" in the last 72 hours. CBC: Recent Labs    03/22/22 0409 03/23/22 0501  WBC 19.9* 20.2*  HGB 9.9* 9.2*  HCT 32.7* 30.4*  MCV 89.3 89.9  PLT 791* 768*    Cardiac Enzymes: No results for input(s): "CKTOTAL", "CKMB", "CKMBINDEX", "TROPONINI" in the  last 72 hours. BNP: Invalid input(s): "POCBNP" D-Dimer: No results for input(s): "DDIMER" in the last 72 hours. Hemoglobin A1C: No results for input(s): "HGBA1C" in the last 72 hours. Fasting Lipid Panel: No results for input(s): "CHOL", "HDL", "LDLCALC", "TRIG", "CHOLHDL", "LDLDIRECT" in the last 72 hours. Thyroid Function Tests: No results for input(s): "TSH", "T4TOTAL", "T3FREE", "THYROIDAB" in the last 72 hours.  Invalid input(s): "FREET3" Anemia Panel: No results for input(s): "VITAMINB12", "FOLATE", "FERRITIN", "TIBC", "IRON", "RETICCTPCT" in the last 72 hours.   No results found.   Echo LVEF 60 to 65%, thrombus noted in right atrium and right ventricle (prior to mechanical thrombectomy), 03/18/2022  TELEMETRY: Sinus rhythm 80 bpm:  ASSESSMENT AND PLAN:  Principal Problem:   Arterial occlusion Active Problems:   Bilateral pulmonary embolism (HCC)   HTN (hypertension)   History of prostate cancer   Coronary artery disease   AKI (acute kidney injury) ([place])   Critical limb ischemia of left lower extremity with ulceration of lower leg (HCC)   Acute respiratory failure with hypoxia (HCC)   Acute on chronic diastolic CHF (congestive heart failure) (HCC)   PSVT (paroxysmal supraventricular tachycardia) (HCC)   Paroxysmal atrial fibrillation with RVR (HCC)   Acute bilateral deep vein thrombosis (DVT) of femoral veins (HCC)   Reactive thrombocytosis    1. Paroxysmal SVT,  in the setting of recent left BKA on 03/16/2022, and bilateral pulmonary emboli status post mechanical thrombectomy 03/18/2022, currently in sinus rhythm, on metoprolol tartrate 2.  Submassive pulmonary embolism, status post mechanical thrombectomy 03/18/2022, status post IVC filter 03/19/2022 3.  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  Elevated troponin, likely demand supply ischemia, secondary to submassive bilateral pulmonary emboli 5.  CAD, history of NSTEMI status post DES proximal LAD 12/2020 6. 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 Severe RV dysfunction by echo 03/18/22 8. Acute hypoxic respiratory failure- Persistent following thromectomy, but improving; on 4L O2 on 03/22/22   Recommendations   1.  Agree with current therapy 2.  Continue heparin infusion with plan to transition back to Eliquis; defer to vascular surgery regarding timing.  3.  Continue metoprolol tartrate at current dose 4.  Continue clopidogrel 5.  Continue pravastatin  Cardiology with sign off. Please reengage if needed.   This patient's plan of care was discussed and created with Dr. Donnelly Angelica and he is in agreement.    Tristan Schroeder, PA-C 03/23/2022 11:02 AM

## 2022-03-23 NOTE — Op Note (Signed)
[facility]  Percutaneous Study/Intervention Procedural Note   Date of Surgery: 03/23/2022,8:01 PM  Surgeon:Kmarion Rawl, Dolores Lory   Pre-operative Diagnosis: Symptomatic pulmonary emboli with right heart strain and hypoxia  Post-operative diagnosis:  Same  Procedure(s) Performed:  1.  Selective injection right subsegmental pulmonary arteries; right upper lobe, middle lobe and lower lobe pulmonary arteries.  2.  Selective injection left subsegmental pulmonary arteries; left lower lobe pulmonary arteries  3.  Mechanical thrombectomy of the right external iliac vein, common iliac vein and inferior vena cava.  4.  Mechanical thrombectomy bilateral lobar pulmonary arteries for removal of pulmonary emboli using the Penumbra CAT 8 thrombectomy catheter.    Anesthesia: Conscious sedation was administered under my direct supervision by the interventional radiology RN. IV Versed plus fentanyl were utilized. Continuous ECG, pulse oximetry and blood pressure was monitored throughout the entire procedure.  Conscious sedation was administered for a total of 1 hour 21 minutes.  Sheath: 12 French 45 cm Cook sheath right common femoral vein antegrade  Contrast: 95 cc   Fluoroscopy Time: 22 minutes  Indications:  Patient presented to the hospital with hypoxia and shortness of breath. The patient is unable to get out of bed and transfer to a chair without increased dyspnea. The patient's O2 saturations off nasal cannula are in the 80%. CT angiogram demonstrated bilateral pulmonary emboli associated with significant right heart strain. Given the long-term sequela and the patient's symptomatic condition the risks and benefits for angiography with thrombectomy are reviewed all questions are answered, the patient agrees to proceed.  Procedure:  Phillip [name] a 76 y.o. male who was identified and appropriate procedural time out was performed.  The patient was then placed supine on the  table and prepped and draped in the usual sterile fashion.  Ultrasound was used to evaluate the right common femoral vein.  It was poorly compressible and enlarged with hypogenic appearance indicating it is likely filled with acute thrombus.  A digital ultrasound image was acquired for the permanent record.  A micropuncture needle was used to access the right common femoral vein under direct ultrasound guidance.  A microwire was then advanced under fluoroscopic guidance followed by micro-sheath.  A 0.035 J wire was advanced without resistance and a 9 Fr sheath was placed.    Angiomax was ordered.  Initial imaging by injection through the 9 French sheath showed thrombus throughout the right iliac veins as well as a large amount of thrombus within the inferior vena cava up to the level of the filter.  Proximal to the filter the inferior vena cava was widely patent.  Sheath was upsized to an 11 [place] Pinnacle sheath.  At this point I used the penumbra CAT 12 with the separator and performed mechanical thrombectomy of the entire right iliac veins as well as the inferior vena cava below the filter.  After 4 passes follow-up imaging demonstrated resolution of 85 to 90% of all the thrombus and there is now flow through the filter once again.  At this point using a Kumpe catheter to guide the advantage wire along the wall, along the side of the filter and not in tangling the wire through the center of the filter I was then able to advance a 45 cm 12 [place] Cook sheath past the filter without displacing the filter at all this gave me a secure conduit to use the CAT 12 and the pulmonary vasculature as planned.  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  Stiff angled Glidewire was then exchanged for the J-wire and the pigtail catheter  was used to cannulate the pulmonary outflow track was selected.  The right main pulmonary artery was evaluated first.    An Amplatz wire was then exchanged for the stiff angle Glidewire and the pigtail catheter was used to select the main pulmonary artery and then the right pulmonary artery.  Hand injection contrast was utilized to demonstrate the thrombus as well as the segmental pulmonary artery vasculature. This demonstrated thrombus in the distal right main pulmonary essentially occluding it in its entirety.   A select catheter was then advanced over the Amplatz wire into the distal right main pulmonary artery and hand-injection confirmed the thrombus.  The Penumbra Cat 12 extra torque catheter was then advanced into the thrombus in the right main pulmonary artery and then into the lower lobe pulmonary artery.  Hand-injection contrast was used to verify positioning and evaluate the distal anatomy.  Mechanical aspiration was performed using the CAT 12 catheter and a separator.  After multiple passes the catheter was then repositioned into the right middle lobe pulmonary artery and again hand-injection contrast was performed to verify position and evaluate the distal anatomy.  Multiple passes were made using mechanical aspiration in association with a separator.  Lastly, using a combination of the separator catheter and Glidewire the CAT 12 device was negotiated into the right upper lobe pulmonary artery.  Hand-injection of contrast was used to verify positioning and evaluate the distal anatomy.  Beginning back in the main pulmonary artery extending into the right upper lobe multiple passes were then performed using the separator with the CAT 12 penumbra catheter.  Once there was free flow of blood from all 3 lobar arteries the catheter was repositioned to the right main pulmonary artery.  Hand-injection contrast was then used to give an assessment of the effectiveness of thrombectomy.  Satisfied with the  thrombectomy on the right, I then used the penumbra CAT 12 device as well as a Glidewire and an angled catheter to select the left main pulmonary artery  I then utilized the CAT 12 catheter with the Glidewire and the select catheter to select the left main pulmonary artery extending into the left lower lobe artery and noting that it was near occlusive.    The Penumbra Cat 12 extra torque catheter was then advanced into the thrombus in the left lower lobe pulmonary artery.  Hand-injection contrast was used to verify the positioning and evaluate the distal anatomy.  Mechanical aspiration was performed using the CAT 12 catheter and a separator.  After multiple passes the catheter was then repositioned into the left upper lobe pulmonary artery and again hand-injection contrast was performed to verify positioning and evaluate the distal anatomy.  Multiple passes were made using mechanical aspiration in association with a separator.  Once there was free flow of blood from the lower lobe and lobar arteries the catheter was repositioned to the left main pulmonary artery.  Hand-injection contrast was then performed to give an assessment of the left pulmonary vasculature and the effectiveness of thrombectomy.  After review these images the catheter and sheath were removed and pressure held. There were no immediate complications.  Findings:   Right pulmonary artery: Initial imaging of the right pulmonary artery shows that it is occluded essentially from its midportion on  all 3 lobar pulmonary arteries are occluded including the segmental branches.  There essentially is no distal flow.  Following thrombectomy of all 3 lobes selectively as described above there is now visualization of the pulmonary vasculature to the periphery in the upper and lower lobes.  The middle lobe is markedly improved but there is some residual thrombus noted toward the periphery.  Left pulmonary: The left main pulmonary artery is free of  thrombus as is the left upper lobe.  The left lower lobe demonstrates subtotal occlusive thrombus in the main lobar artery.  This is addressed with the penumbra as described above and there is essentially 90% resolution of the thrombus with marked improvement in flow within the lower lobe now filling the periphery              Inferior vena cava and right iliac veins: Initial imaging demonstrates occlusive thrombus throughout the iliac and caval system extending up to the filter.  Proximal to the filter there is patency without evidence of thrombus.  After mechanical thrombectomy there is resolution of 85 to 90% of all thrombus located within the iliac veins as well as the IVC and there is restoration of flow through the filter.    Disposition: Patient was taken to the recovery room in stable condition having tolerated the procedure well.  Phillip [name] 03/23/2022,8:01 PM

## 2022-03-23 NOTE — Progress Notes (Signed)
ANTICOAGULATION CONSULT NOTE  Pharmacy Consult for Argatroban infusion Indication: thromboembolic treatment  Patient Measurements: Height: '5\' 9"'$  (175.3 cm) Weight: 73.1 kg (161 lb 2.5 oz) IBW/kg (Calculated) : 70.7 Heparin Dosing Weight: 79.3 kg  Labs: Recent Labs    03/21/22 0135 03/21/22 0949 03/21/22 2008 03/22/22 0409 03/22/22 1352 03/22/22 2150 03/23/22 0501  HGB 9.4*  --   --  9.9*  --   --  9.2*  HCT 30.4*  --   --  32.7*  --   --  30.4*  PLT 685*  --   --  791*  --   --  768*  APTT 81* 65*  --   --   --   --   --   HEPARINUNFRC  --  0.31   < > 0.24* 0.40 0.42 0.47  CREATININE  --   --   --  1.39*  --   --  1.57*   < > = values in this interval not displayed.     Estimated Creatinine Clearance: 40.7 mL/min (A) (by C-G formula based on SCr of 1.57 mg/dL (H)).   Medical History: Past Medical History:  Diagnosis Date   Arthritis    Basal cell carcinoma    L clavicle, L prox forearm- removed years ago    Bladder cancer (HCC)    Bladder neck contracture    CAD (coronary artery disease)    a. 12/2020 NSTEMI/Cath: LM nl, LAD 95ost/p, LCX 50p, RCA nl. EF 45-50%.   Cataract    CKD (chronic kidney disease), stage III [facility])    ED (erectile dysfunction)    Frequency    GERD (gastroesophageal reflux disease)    History of pulmonary embolus (PE) 11/2018   a. Following LE DVT-->Chronic warfarin.   Hyperlipidemia LDL goal <70    Hypertension    Hypothyroidism    Incontinence of urine    sui, s/p cryoablation   Ischemic cardiomyopathy    a. 11/2018 Echo: EF 60-65%; b. 12/2020 LV Gram: EF 45-50% in setting of NSTEMI.   Kidney stones    Neuropathy    feet   Nocturia    Prostate cancer (HCC)    S/P   CRYOABLATION   Right elbow tendinitis    Right Lower Extremity DVT (deep venous thrombosis) (HCC) 11/25/2018   Vertigo    1-2x/yr   Wears glasses     Assessment: 76 y.o. 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 Patient recently having  discontinued this about 1 week ago in preparation for placement of an artificial urinary sphincter procedure on on 03/09/22 and was instructed to continue to hold Eliquis 48 hours post-op. Presents to ED on 03/11/22 with pain and duskiness in the left foot extending towards the left calf. Pharmacy has been consulted for heparin infusion for VTE.   S/P thrombectomy 6/23, additional thromboembolectomy on 6/24 PM.  Pt was on heparin gtt since 6/24 @ ~ 2000. Pt was on Eliquis 10 mg PO BID. On 6/26 he was transitioned to aggrastat drip x 18 hrs with the hope for improving his forefoot perfusion, which was completed then transitioned to apixaban with last dose administered 0904 03/17/22 now with a new PE  7/05: Patient returned to OR with vascular team for pulmonary thrombectomy. Heparin infusion was stopped and was started on angiomax during procedure, but later transitioned to argatroban.  Goal of Therapy:  aPTT: 50-90 sec Monitor platelets by anticoagulation protocol: Yes   Plan:  - starting  argatroban @ 0.5 mcg/kg/min(~2.'19mg'$ /hr) - will check aPTT in 4 hours and daily while on infusion - CBC daily  Pearla Dubonnet  03/23/2022 8:36 PM

## 2022-03-23 NOTE — Progress Notes (Signed)
PROGRESS NOTE    Phillip [name]  LNL:[id number] DOB: 02/19/1946  DOA: 03/11/2022 Date of Service: 03/23/22 PCP: Phillip Aus, MD     Brief Narrative / Hospital Course:  Phillip [name] is a 76 y.o. male with medical history significant for recurrent DVTs, post operative PE on Eliquis, HTN, HLD, malignant neoplasm of prostate post resection, osteoarthritis, CAD status post PCI with stenting on Plavix, chronic diastolic CHF, who presents to [facility] ED 03/11/22 from his PCP's office, Phillip [name], due to concern for critical left lower limb ischemia.  The patient had an artificial urinary sphincter placed on Wednesday, 03/09/2022.  His Eliquis was held a week prior to the procedure.  1 to 2 days after being off Eliquis he started having pain in his left lower extremity.  His left lower extremity pain became progressively worse, 12/10 in the last 24 hours Patient was seen by vascular surgery, had a lower extremity bypass surgery on 6/23.  Went back to the OR on 6/24 for L LE thrombectomy.  Patient also placed on heparin drip. L leg unsalvageable, BKA scheduled. 6/28.  Developed episode of PSVT, hypoxia.  Appears to be flash pulm edema, given IV Lasix. BKA performed on 6/28. 6/30.  Patient developed hypoxia and tachycardia again with atrial fibrillation.  CT angiogram was performed 6/29 showed bilateral PE.  Thrombectomy performed on 6/30, heparin drip restarted. 7/1.  Patient had another episode of hypoxemia, requiring high flow oxygen.  Duplex ultrasound showed bilateral DVT, IVC filter placed. 7/5: repeat CTA, increased clot burden on R and evidence of R heart strain, L appears improved. Planning for thrombectomy this afternoon. Cardiology has s/o    Consultants:  Vascular Surgery Cardiology   Procedures: 03/16/22: L BKA 03/18/22: thrombectomy bilateral PE 03/19/22: IVC filter placed  [Planned 03/23/22: thrombectomy R PE]    Subjective: Patient reports feeling tired, SOB, no chest  pain, he is nervous about the newer clot. He would like to be full code. See below for [place] discussion.      ASSESSMENT & PLAN:   Principal Problem:   Arterial occlusion Active Problems:   Bilateral pulmonary embolism (HCC)   HTN (hypertension)   History of prostate cancer   Coronary artery disease   AKI (acute kidney injury) ([place])   Critical limb ischemia of left lower extremity with ulceration of lower leg (HCC)   Acute respiratory failure with hypoxia (HCC)   Acute on chronic diastolic CHF (congestive heart failure) (HCC)   PSVT (paroxysmal supraventricular tachycardia) (HCC)   Paroxysmal atrial fibrillation with RVR (HCC)   Acute bilateral deep vein thrombosis (DVT) of femoral veins (HCC)   Reactive thrombocytosis   No problem-specific Assessment & Plan notes found for this encounter.  Acute hypoxemic respiratory failure Bilateral pulmonary emboli. Bilateral lower extremity DVT. Flash pulmonary edema secondary to acute on chronic diastolic congestive heart failure. Paroxysmal A fib Patient is status post pulmonary thrombectomy.  IVC filter placed 7/1 due to bilateral lower extremity DVT. Patient appears to have recurrent PE even on therapeutic heparin drip.  As result, IVC filter was placed, continuin on heparin gtt per vascular  Significant short of breath with minimal exertion, has evidence of pulmonary hypertension.  Patient still have a large burden of PE, may benefit from repeated pulmonary thrombectomy. Patient in stepdown unit anticipating vascular surgery procedure. Oxygenation is improving. OT/PT if clinical improvement   SIRS criteria w/ tachypnea (likely clot burden in pulmonary vasculature) and elevated WBC (likely reactive but slowly trending up/down over  course of admission) 07/01 CXR no infiltrate 07/05 WBC up, likely inflammatory/reactive to increased clot burden. If still going up tomorrow may need repeat blood cultures, UA/UCx, CXR, full skin assessment but  at this poitn no s/s infection   Goals of care:  Today 03/23/22 patient voiced to nurse that he wanted to be made full code again, I came to chat with him before putting in the official orders.  Will go ahead and designate him as full code, he does note that "I would not want to be kept alive on machines indefinitely" and verbalizes that if he were to suffer cardiac/respiratory arrest and need to be placed on life support, but unable to come off of that life support, he would want these measures to be withdrawn and allow for natural death   Elevated troponin Appreciate cardiology consult.  Appear to be secondary to large PE with heart strain  Reactive thrombocytosis. Acquired hemophilia. Continue heparin drip per vascular surgery    Left lower extremity ischemia.  Status post BKA Failed effort with revascularization and thrombectomy. Start OT 07/04.  Insomnia. Has not been able to sleep in the hospital, melatonin does not seem to work. Started lower dose Seroquel.  Reviewed patient EKG, no significant QT interval prolongation. Patient slept better last onSeroquel.   Constipation. Condition improved.   History of bladder cancer s/p resection. History of prostate cancer. Urinary stress incontinence.  Status post artificial urinary sphincter on 6/21. No new issues   Acute kidney injury. Relatively stable, continue to follow  Coronary artery disease status post PCI and stenting. Continue Plavix, statin.   Essential hypertension. Continue metoprolol.   DVT prophylaxis: heparin gtt Code Status: DNR Family Communication: spoke on phone to son, Phillip [name] Disposition Plan / TOC needs: remains in SDU anticipating vascular surgery, either way will need PT/OT and likely placement once more stable  Barriers to discharge / significant pending items: pending thrombectomy procedure. Still on heparin gtt.              Objective: Vitals:   03/23/22 0200 03/23/22 0300 03/23/22 0400  03/23/22 0500  BP: 118/70 115/73 116/72 132/77  Pulse: 81 77 82 83  Resp: (!) 23 (!) 21 (!) 24 (!) 29  Temp: 98.2 F (36.8 C)  97.8 F (36.6 C)   TempSrc: Oral  Oral   SpO2: 99% 100% 100% 98%  Weight:      Height:        Intake/Output Summary (Last 24 hours) at 03/23/2022 0715 Last data filed at 03/23/2022 0550 Gross per 24 hour  Intake 615.56 ml  Output 1300 ml  Net -684.44 ml  Net IO Since Admission: -1,833.47 mL [03/23/22 0721]  Filed Weights   03/11/22 1915 03/16/22 1424 03/18/22 0500  Weight: 79.3 kg 79.3 kg 73.1 kg    Examination:  Constitutional:  VS as above General Appearance: alert, well-developed, well-nourished, NAD Eyes: Normal lids and conjunctive, non-icteric sclera Ears, Nose, Mouth, Throat: Normal appearance Neck: No masses, trachea midline Respiratory: Normal respiratory effort Breath sounds normal, no wheeze/rhonchi/rales Cardiovascular: S1/S2 normal, no murmur/rub/gallop auscultated No lower extremity edema on RLE Gastrointestinal: Nontender, no masses No hepatomegaly, no splenomegaly No hernia appreciated Musculoskeletal:  No clubbing/cyanosis of digits Neurological: No cranial nerve deficit on limited exam Motor and sensation intact and symmetric Psychiatric: Normal judgment/insight Anxious mood and affect       Scheduled Medications:   Chlorhexidine Gluconate Cloth  6 each Topical Daily   clopidogrel  75 mg Oral Daily  insulin aspart  0-15 Units Subcutaneous TID WC   iron polysaccharides  150 mg Oral Daily   levothyroxine  50 mcg Oral Q0600   melatonin  5 mg Oral QHS   metoprolol tartrate  12.5 mg Oral BID   pantoprazole  40 mg Oral Daily   polyethylene glycol  17 g Oral BID   pravastatin  40 mg Oral Daily   QUEtiapine  25 mg Oral QHS   sodium chloride flush  3 mL Intravenous Q12H    Continuous Infusions:  heparin 1,350 Units/hr (03/23/22 0200)    PRN Medications:  acetaminophen **OR** acetaminophen, alum & mag  hydroxide-simeth, guaiFENesin-dextromethorphan, hydrALAZINE, HYDROmorphone (DILAUDID) injection, LORazepam **OR** LORazepam, melatonin, metoprolol tartrate, ondansetron (ZOFRAN) IV, oxyCODONE-acetaminophen, phenol, sodium chloride flush, sorbitol  Antimicrobials:  Anti-infectives (From admission, onward)    Start     Dose/Rate Route Frequency Ordered Stop   03/18/22 0943  ceFAZolin (ANCEF) IVPB 2g/100 mL premix        2 g 200 mL/hr over 30 Minutes Intravenous 30 min pre-op 03/18/22 0943 03/18/22 1349   03/17/22 2200  ceFAZolin (ANCEF) IVPB 1 g/50 mL premix       Note to Pharmacy: Send with pt to OR   1 g 100 mL/hr over 30 Minutes Intravenous Every 8 hours 03/17/22 1718 03/18/22 0547   03/16/22 1508  ceFAZolin (ANCEF) 2-4 GM/100ML-% IVPB       Note to Pharmacy: Trudie Reed S: cabinet override      03/16/22 1508 03/16/22 1554   03/16/22 0600  ceFAZolin (ANCEF) IVPB 2g/100 mL premix        2 g 200 mL/hr over 30 Minutes Intravenous On call to O.R. 03/15/22 1315 03/16/22 1544   03/13/22 0600  ceFAZolin (ANCEF) IVPB 2g/100 mL premix       Note to Pharmacy: Send with pt to OR   2 g 200 mL/hr over 30 Minutes Intravenous On call 03/12/22 1948 03/12/22 2017   03/13/22 0400  ceFAZolin (ANCEF) IVPB 2g/100 mL premix        2 g 200 mL/hr over 30 Minutes Intravenous Every 8 hours 03/13/22 0048 03/13/22 1257   03/12/22 0600  ceFAZolin (ANCEF) IVPB 2g/100 mL premix        2 g 200 mL/hr over 30 Minutes Intravenous On call to O.R. 03/11/22 1525 03/11/22 1705   03/11/22 1547  ceFAZolin (ANCEF) 2-4 GM/100ML-% IVPB       Note to Pharmacy: Theadora Rama : cabinet override      03/11/22 1547 03/11/22 1635       Data Reviewed: I have personally reviewed following labs and imaging studies  CBC: Recent Labs  Lab 03/19/22 0453 03/20/22 0805 03/21/22 0135 03/22/22 0409 03/23/22 0501  WBC 15.3* 15.3* 17.9* 19.9* 20.2*  HGB 8.7* 9.3* 9.4* 9.9* 9.2*  HCT 28.1* 29.6* 30.4* 32.7* 30.4*  MCV 88.4  87.8 89.1 89.3 89.9  PLT 395 581* 685* 791* 573*   Basic Metabolic Panel: Recent Labs  Lab 03/17/22 0604 03/18/22 0519 03/20/22 0805 03/22/22 0409 03/23/22 0501  NA 136 135 137 139 139  K 4.8 4.7 4.3 4.3 4.3  CL 104 102 105 113* 111  CO2 '25 24 24 22 22  '$ GLUCOSE 162* 183* 127* 147* 135*  BUN 29* 34* 28* 28* 30*  CREATININE 1.20 1.57* 1.35* 1.39* 1.57*  CALCIUM 8.5* 8.5* 8.3* 8.0* 8.2*  MG 2.0 2.2 2.4 2.2 2.2   GFR: Estimated Creatinine Clearance: 40.7 mL/min (A) (by C-G formula based on SCr  of 1.57 mg/dL (H)). Liver Function Tests: No results for input(s): "AST", "ALT", "ALKPHOS", "BILITOT", "PROT", "ALBUMIN" in the last 168 hours. No results for input(s): "LIPASE", "AMYLASE" in the last 168 hours. No results for input(s): "AMMONIA" in the last 168 hours. Coagulation Profile: No results for input(s): "INR", "PROTIME" in the last 168 hours. Cardiac Enzymes: No results for input(s): "CKTOTAL", "CKMB", "CKMBINDEX", "TROPONINI" in the last 168 hours. BNP (last 3 results) No results for input(s): "PROBNP" in the last 8760 hours. HbA1C: No results for input(s): "HGBA1C" in the last 72 hours. CBG: Recent Labs  Lab 03/21/22 1600 03/21/22 2137 03/22/22 0803 03/22/22 1214 03/22/22 1705  GLUCAP 151* 152* 131* 120* 136*   Lipid Profile: No results for input(s): "CHOL", "HDL", "LDLCALC", "TRIG", "CHOLHDL", "LDLDIRECT" in the last 72 hours. Thyroid Function Tests: No results for input(s): "TSH", "T4TOTAL", "FREET4", "T3FREE", "THYROIDAB" in the last 72 hours. Anemia Panel: Recent Labs    03/20/22 0805  VITAMINB12 431  FERRITIN 66  TIBC 192*  IRON 22*   Urine analysis:    Component Value Date/Time   COLORURINE YELLOW (A) 02/13/2022 0744   APPEARANCEUR HAZY (A) 02/13/2022 0744   APPEARANCEUR Clear 10/25/2014 2206   LABSPEC 1.018 02/13/2022 0744   LABSPEC 1.016 10/25/2014 2206   PHURINE 5.0 02/13/2022 0744   GLUCOSEU NEGATIVE 02/13/2022 0744   GLUCOSEU >=500  10/25/2014 2206   HGBUR NEGATIVE 02/13/2022 0744   BILIRUBINUR NEGATIVE 02/13/2022 0744   BILIRUBINUR Negative 10/25/2014 2206   KETONESUR NEGATIVE 02/13/2022 0744   PROTEINUR NEGATIVE 02/13/2022 0744   NITRITE NEGATIVE 02/13/2022 0744   LEUKOCYTESUR NEGATIVE 02/13/2022 0744   LEUKOCYTESUR Negative 10/25/2014 2206   Sepsis Labs: '@LABRCNTIP'$ (procalcitonin:4,lacticidven:4)  No results found for this or any previous visit (from the past 240 hour(s)).       Radiology Studies last 96 hours: PERIPHERAL VASCULAR CATHETERIZATION  Result Date: 03/21/2022    Patient name: KAZMIR OKI        MRN: [id number]        DOB: [birth year]          Sex: male  03/19/2022 Pre-operative Diagnosis: DVT Post-operative diagnosis:  Same Surgeon:  Annamarie Major Procedure Performed:            1.  U/s guided access, right femoral vein            2.  IVC venogram            3.  Placement of IVC filter    Indications: This is a 76 year old gentleman who underwent PE thrombectomy yesterday.  He had an acute pulmonary decompensation today while on anticoagulation.  The hospital team is worried about recurrent PE in the setting of anticoagulation and would like an IVC filter placed.  Informed consent was obtained via the son and the patient.  Procedure:  The patient was identified in the holding area and taken to the specials procedure room.  A timeout was called.  Ultrasound was used to evaluate the right common femoral vein which was widely patent and easily compressible.  1% lidocaine was used for local anesthesia.  The right common femoral vein was then cannulated under ultrasound guidance with a micropuncture needle.  A 018 wire was advanced without resistance followed placement of micropuncture sheath.  Next, a 035 wire was inserted and a 5 French sheath was placed.  A pigtail catheter was positioned in the right common iliac vein and a IVC venogram was performed which identified the location of the renal veins.  The IVC  was of normal diameter without any anatomic variance.  A Amplatz wire was then inserted.  The filter introducing sheath was then placed up to the level of the renal veins.  A argon filter was then loaded into the sheath and then successfully deployed landing at the level of the renal veins.  The filter was in good position without any tilt.  The sheath was then removed and manual pressure was held for hemostasis.  There were no immediate complications.    Impression:             #1  Successful placement of removable infrarenal IVC filter             #2  Normal venacavogram   V. Annamarie Major, M.D., FACS [facility] Office: [phone] Pager:  [phone]   US Venous Img Lower Bilateral (DVT)  Result Date: 03/19/2022 CLINICAL DATA:  History of DVT New shortness of breath Status post left below-knee amputation History of bladder and prostate malignancy EXAM: BILATERAL LOWER EXTREMITY VENOUS DOPPLER ULTRASOUND TECHNIQUE: Gray-scale sonography with graded compression, as well as color Doppler and duplex ultrasound were performed to evaluate the lower extremity deep venous systems from the level of the common femoral vein and including the common femoral, femoral, profunda femoral, popliteal and calf veins including the posterior tibial, peroneal and gastrocnemius veins when visible. The superficial great saphenous vein was also interrogated. Spectral Doppler was utilized to evaluate flow at rest and with distal augmentation maneuvers in the common femoral, femoral and popliteal veins. COMPARISON:  11/21/2021 FINDINGS: RIGHT LOWER EXTREMITY Common Femoral Vein: No evidence of thrombus. Normal compressibility, respiratory phasicity and response to augmentation. Saphenofemoral Junction: No evidence of thrombus. Normal compressibility and flow on color Doppler imaging. Profunda Femoral Vein: No evidence of thrombus. Normal compressibility and flow on color Doppler imaging. Femoral Vein:  Diffuse thrombosis. Popliteal Vein: Diffusely thrombosed. Calf Veins: Posterior tibial, peroneal, and gastrocnemius veins are thrombosed. Venous Reflux:  None. Other Findings:  None. LEFT LOWER EXTREMITY Common Femoral Vein: No evidence of thrombus. Normal compressibility, respiratory phasicity and response to augmentation. Saphenofemoral Junction: No evidence of thrombus. Normal compressibility and flow on color Doppler imaging. Profunda Femoral Vein: Partially thrombosed. Femoral Vein: No evidence of thrombus. Normal compressibility, respiratory phasicity and response to augmentation. Popliteal Vein: No evidence of thrombus. Normal compressibility, respiratory phasicity and response to augmentation. Other Findings:  None. IMPRESSION: 1. Thrombosis of the right femoral, popliteal, and calf veins. 2. Partially thrombosed left Profunda femoris vein. Electronically Signed   By: Miachel Roux M.D.   On: 03/19/2022 16:20   DG Chest Port 1 View  Result Date: 03/19/2022 CLINICAL DATA:  Shortness of breath, respiratory distress, coronary artery disease, stage III chronic kidney disease, hypertension, prostate cancer EXAM: PORTABLE CHEST 1 VIEW COMPARISON:  Portable exam 1401 hours compared to 03/16/2022 FINDINGS: Mild enlargement of cardiac silhouette. Mediastinal contours and pulmonary vascularity normal. Lungs clear. No acute infiltrate, pleural effusion, or pneumothorax. Osseous structures unremarkable. IMPRESSION: No acute abnormalities. Electronically Signed   By: Lavonia Dana M.D.   On: 03/19/2022 14:19            LOS: 12 days    Time spent: 50 min    Emeterio Reeve, DO [facility] 03/23/2022, 7:15 AM   Staff may message me via secure chat in [place]  but this may not receive immediate response,  please page for urgent matters!  If 7PM-7AM, please contact night-coverage [email or web address]  Higher education careers adviser  was used to generate the above note. Typos may occur and escape review, as with  typed/written notes. Please contact Dr Sheppard Coil directly for clarity if needed.

## 2022-03-24 ENCOUNTER — Encounter: Payer: Self-pay | Admitting: Vascular Surgery

## 2022-03-24 LAB — CBC
HCT: 27 % — ABNORMAL LOW (ref 39.0–52.0)
Hemoglobin: 8.2 g/dL — ABNORMAL LOW (ref 13.0–17.0)
MCH: 27.5 pg (ref 26.0–34.0)
MCHC: 30.4 g/dL (ref 30.0–36.0)
MCV: 90.6 fL (ref 80.0–100.0)
Platelets: 643 10*3/uL — ABNORMAL HIGH (ref 150–400)
RBC: 2.98 MIL/uL — ABNORMAL LOW (ref 4.22–5.81)
RDW: 17.8 % — ABNORMAL HIGH (ref 11.5–15.5)
WBC: 22.5 10*3/uL — ABNORMAL HIGH (ref 4.0–10.5)
nRBC: 0.9 % — ABNORMAL HIGH (ref 0.0–0.2)

## 2022-03-24 LAB — BLOOD GAS, ARTERIAL
Acid-base deficit: 2.6 mmol/L — ABNORMAL HIGH (ref 0.0–2.0)
Bicarbonate: 21.7 mmol/L (ref 20.0–28.0)
O2 Content: 5 L/min
O2 Saturation: 92.6 %
Patient temperature: 37
pCO2 arterial: 35 mmHg (ref 32–48)
pH, Arterial: 7.4 (ref 7.35–7.45)
pO2, Arterial: 62 mmHg — ABNORMAL LOW (ref 83–108)

## 2022-03-24 LAB — GLUCOSE, CAPILLARY
Glucose-Capillary: 148 mg/dL — ABNORMAL HIGH (ref 70–99)
Glucose-Capillary: 252 mg/dL — ABNORMAL HIGH (ref 70–99)
Glucose-Capillary: 182 mg/dL — ABNORMAL HIGH (ref 70–99)
Glucose-Capillary: 182 mg/dL — ABNORMAL HIGH (ref 70–99)

## 2022-03-24 LAB — ANTITHROMBIN III: AntiThromb III Func: 93 % (ref 75–120)

## 2022-03-24 LAB — APTT
aPTT: 71 seconds — ABNORMAL HIGH (ref 24–36)
aPTT: 63 seconds — ABNORMAL HIGH (ref 24–36)

## 2022-03-24 MED ORDER — TRAZODONE HCL 50 MG PO TABS
50.0000 mg | ORAL_TABLET | Freq: Every evening | ORAL | Status: DC | PRN
  Administered 2022-03-24 – 2022-03-25 (×2): 50 mg via ORAL
  Filled 2022-03-24 (×2): qty 1

## 2022-03-24 NOTE — Progress Notes (Signed)
ANTICOAGULATION CONSULT NOTE  Pharmacy Consult for Argatroban infusion Indication: thromboembolic treatment  Patient Measurements: Height: '5\' 9"'$  (175.3 cm) Weight: 73.1 kg (161 lb 2.5 oz) IBW/kg (Calculated) : 70.7 Heparin Dosing Weight: 79.3 kg  Labs: Recent Labs    03/21/22 0949 03/21/22 2008 03/22/22 0409 03/22/22 1352 03/22/22 2150 03/23/22 0501 03/23/22 2325 03/24/22 0347  HGB  --    < > 9.9*  --   --  9.2*  --  8.2*  HCT  --   --  32.7*  --   --  30.4*  --  27.0*  PLT  --   --  791*  --   --  768*  --  643*  APTT 65*  --   --   --   --   --  71* 63*  HEPARINUNFRC 0.31   < > 0.24* 0.40 0.42 0.47  --   --   CREATININE  --   --  1.39*  --   --  1.57*  --   --    < > = values in this interval not displayed.     Estimated Creatinine Clearance: 40.7 mL/min (A) (by C-G formula based on SCr of 1.57 mg/dL (H)).   Medical History: Past Medical History:  Diagnosis Date   Arthritis    Basal cell carcinoma    L clavicle, L prox forearm- removed years ago    Bladder cancer (HCC)    Bladder neck contracture    CAD (coronary artery disease)    a. 12/2020 NSTEMI/Cath: LM nl, LAD 95ost/p, LCX 50p, RCA nl. EF 45-50%.   Cataract    CKD (chronic kidney disease), stage III [facility])    ED (erectile dysfunction)    Frequency    GERD (gastroesophageal reflux disease)    History of pulmonary embolus (PE) 11/2018   a. Following LE DVT-->Chronic warfarin.   Hyperlipidemia LDL goal <70    Hypertension    Hypothyroidism    Incontinence of urine    sui, s/p cryoablation   Ischemic cardiomyopathy    a. 11/2018 Echo: EF 60-65%; b. 12/2020 LV Gram: EF 45-50% in setting of NSTEMI.   Kidney stones    Neuropathy    feet   Nocturia    Prostate cancer (HCC)    S/P   CRYOABLATION   Right elbow tendinitis    Right Lower Extremity DVT (deep venous thrombosis) (HCC) 11/25/2018   Vertigo    1-2x/yr   Wears glasses     Assessment: 76 y.o. male with past medical history of PE and recurrent  DVTs on Eliquis. Patient recently having discontinued this about 1 week ago in preparation for placement of an artificial urinary sphincter procedure on on 03/09/22 and was instructed to continue to hold Eliquis 48 hours post-op. Presents to ED on 03/11/22 with pain and duskiness in the left foot extending towards the left calf. Pharmacy has been consulted for heparin infusion for VTE.   S/P thrombectomy 6/23, additional thromboembolectomy on 6/24 PM.  Pt was on heparin gtt since 6/24 @ ~ 2000. Pt was on Eliquis 10 mg PO BID. On 6/26 he was transitioned to aggrastat drip x 18 hrs with the hope for improving his forefoot perfusion, which was completed then transitioned to apixaban with last dose administered 0904 03/17/22 now with a new PE  7/05: Patient returned to OR with vascular team for pulmonary thrombectomy. Heparin infusion was stopped and was started on angiomax during procedure, but later transitioned to argatroban.  Goal of Therapy:  aPTT: 50-90 sec Monitor platelets by anticoagulation protocol: Yes   Plan:  7/6: aPTT @ 8102 = 63, therapeutic X 2 Will continue pt on current rate and recheck aPTT on 7/7 with AM labs.   Rebekah Zackery D  03/24/2022 4:45 AM

## 2022-03-24 NOTE — Progress Notes (Incomplete)
[facility]  Daily Progress Note   Subjective  -   ***  Objective Vitals:   03/24/22 2100 03/24/22 2200 03/24/22 2204 03/24/22 2300  BP: 113/67 (!) 85/53  111/69  Pulse: 91 96 88 84  Resp: (!) 22 19 (!) 21 19  Temp:      TempSrc:      SpO2: 100% 99% 100% 100%  Weight:      Height:        Intake/Output Summary (Last 24 hours) at 03/24/2022 2349 Last data filed at 03/24/2022 1859 Gross per 24 hour  Intake 650.12 ml  Output 701 ml  Net -50.88 ml    PULM  CTAB CV  RRR VASC  ***  Laboratory CBC    Component Value Date/Time   WBC 22.5 (H) 03/24/2022 0347   HGB 8.2 (L) 03/24/2022 0347   HGB 12.7 (L) 10/25/2014 2206   HCT 27.0 (L) 03/24/2022 0347   HCT 40.9 10/25/2014 2206   PLT 643 (H) 03/24/2022 0347   PLT 188 10/25/2014 2206    BMET    Component Value Date/Time   NA 139 03/23/2022 0501   NA 141 10/25/2014 2206   K 4.3 03/23/2022 0501   K 4.0 10/25/2014 2206   CL 111 03/23/2022 0501   CL 109 (H) 10/25/2014 2206   CO2 22 03/23/2022 0501   CO2 26 10/25/2014 2206   GLUCOSE 135 (H) 03/23/2022 0501   GLUCOSE 210 (H) 10/25/2014 2206   BUN 30 (H) 03/23/2022 0501   BUN 21 08/15/2016 1618   BUN 15 10/25/2014 2206   CREATININE 1.57 (H) 03/23/2022 0501   CREATININE 1.32 (H) 10/25/2014 2206   CALCIUM 8.2 (L) 03/23/2022 0501   CALCIUM 8.2 (L) 10/25/2014 2206   GFRNONAA 46 (L) 03/23/2022 0501   GFRNONAA 58 (L) 10/25/2014 2206   GFRAA >60 03/27/2019 0637   GFRAA >60 10/25/2014 2206    Assessment/Planning: The patient is post pulmonary thrombectomy.  This is the patient's second pulmonary thrombectomy.  He notes that his breathing is much improved and he is able to exert himself without significant shortness of breath as before.  His groin is clean dry and intact.  Hypercoagulable studies have been ordered.  Kris Hartmann  03/24/2022, 11:49 PM

## 2022-03-24 NOTE — Progress Notes (Signed)
[facility]  Daily Progress Note   Subjective  -   Patient notes shortness of breath is improved.  Having issues with sleeping.  Objective Vitals:   03/24/22 2100 03/24/22 2200 03/24/22 2204 03/24/22 2300  BP: 113/67 (!) 85/53  111/69  Pulse: 91 96 88 84  Resp: (!) 22 19 (!) 21 19  Temp:      TempSrc:      SpO2: 100% 99% 100% 100%  Weight:      Height:        Intake/Output Summary (Last 24 hours) at 03/24/2022 2349 Last data filed at 03/24/2022 1859 Gross per 24 hour  Intake 650.12 ml  Output 701 ml  Net -50.88 ml    PULM  CTAB CV  RRR VASC  dopplerable pedal pulses in right lower extremity, clean dry left BKA  Laboratory CBC    Component Value Date/Time   WBC 22.5 (H) 03/24/2022 0347   HGB 8.2 (L) 03/24/2022 0347   HGB 12.7 (L) 10/25/2014 2206   HCT 27.0 (L) 03/24/2022 0347   HCT 40.9 10/25/2014 2206   PLT 643 (H) 03/24/2022 0347   PLT 188 10/25/2014 2206    BMET    Component Value Date/Time   NA 139 03/23/2022 0501   NA 141 10/25/2014 2206   K 4.3 03/23/2022 0501   K 4.0 10/25/2014 2206   CL 111 03/23/2022 0501   CL 109 (H) 10/25/2014 2206   CO2 22 03/23/2022 0501   CO2 26 10/25/2014 2206   GLUCOSE 135 (H) 03/23/2022 0501   GLUCOSE 210 (H) 10/25/2014 2206   BUN 30 (H) 03/23/2022 0501   BUN 21 08/15/2016 1618   BUN 15 10/25/2014 2206   CREATININE 1.57 (H) 03/23/2022 0501   CREATININE 1.32 (H) 10/25/2014 2206   CALCIUM 8.2 (L) 03/23/2022 0501   CALCIUM 8.2 (L) 10/25/2014 2206   GFRNONAA 46 (L) 03/23/2022 0501   GFRNONAA 58 (L) 10/25/2014 2206   GFRAA >60 03/27/2019 0637   GFRAA >60 10/25/2014 2206    Assessment/Planning: The patient is post pulmonary thrombectomy.  This is the patient's second pulmonary thrombectomy.  He notes that his breathing is much improved and he is able to exert himself without significant shortness of breath as before.  His groin is clean dry and intact.  Hypercoagulable studies have been  ordered.  Kris Hartmann  03/24/2022, 11:49 PM

## 2022-03-24 NOTE — Progress Notes (Signed)
PROGRESS NOTE    Phillip [name]  UUV:[id number] DOB: September 07, 1946  DOA: 03/11/2022 Date of Service: 03/24/22 PCP: Rusty Aus, MD     Brief Narrative / Hospital Course:  Phillip [name] is a 76 y.o. male with medical history significant for recurrent DVTs, post operative PE on Eliquis, HTN, HLD, malignant neoplasm of prostate post resection, osteoarthritis, CAD status post PCI with stenting on Plavix, chronic diastolic CHF, who presents to [facility] ED 03/11/22 from his PCP's office, Dr. Sabra Heck, due to concern for critical left lower limb ischemia.  The patient had an artificial urinary sphincter placed on Wednesday, 03/09/2022.  His Eliquis was held a week prior to the procedure.  1 to 2 days after being off Eliquis he started having pain in his left lower extremity.  His left lower extremity pain became progressively worse, 12/10 in the last 24 hours Patient was seen by vascular surgery, had a lower extremity bypass surgery on 6/23.  Went back to the OR on 6/24 for L LE thrombectomy.  Patient also placed on heparin drip. L leg unsalvageable, BKA scheduled. 6/28.  Developed episode of PSVT, hypoxia.  Appears to be flash pulm edema, given IV Lasix. BKA performed on 6/28. 6/30.  Patient developed hypoxia and tachycardia again with atrial fibrillation.  CT angiogram was performed 6/29 showed bilateral PE.  Thrombectomy performed on 6/30, heparin drip restarted. 7/1.  Patient had another episode of hypoxemia, requiring high flow oxygen.  Duplex ultrasound showed bilateral DVT, IVC filter placed. 7/5: repeat CTA, increased clot burden on R and evidence of R heart strain, L appears improved. Planning for thrombectomy this afternoon. Cardiology has s/o  7/6: BP soft and tachypnea noted, reg HR this AM but increased later in the day, afebrile, SpO2 100% RA. Subjective improvement, no CP/SOB. Remains on argatroban infusion per vascular recs.    Consultants:  Vascular Surgery Cardiology    Procedures: 03/16/22: L BKA 03/18/22: thrombectomy bilateral PE 03/19/22: IVC filter placed  03/23/22: thrombectomy bilateral PE    Subjective: Patient reports feeling more energetic compared to yesterday. Breathing is much easier, no chest pain.      ASSESSMENT & PLAN:   Principal Problem:   Arterial occlusion Active Problems:   Bilateral pulmonary embolism (HCC)   HTN (hypertension)   History of prostate cancer   Coronary artery disease   AKI (acute kidney injury) ([place])   Critical limb ischemia of left lower extremity with ulceration of lower leg (HCC)   Acute respiratory failure with hypoxia (HCC)   Acute on chronic diastolic CHF (congestive heart failure) (HCC)   PSVT (paroxysmal supraventricular tachycardia) (HCC)   Paroxysmal atrial fibrillation with RVR (HCC)   Acute bilateral deep vein thrombosis (DVT) of femoral veins (HCC)   Reactive thrombocytosis   Goals of care, counseling/discussion   No problem-specific Assessment & Plan notes found for this encounter.  Acute hypoxemic respiratory failure Bilateral pulmonary emboli. Bilateral lower extremity DVT. Flash pulmonary edema secondary to acute on chronic diastolic congestive heart failure. Paroxysmal A fib Patient is status post pulmonary thrombectomy.  IVC filter placed 7/1 due to bilateral lower extremity DVT. Patient appears to have recurrent PE even on therapeutic heparin drip.  As result, IVC filter was placed, continuin on heparin gtt per vascular  Significant short of breath with minimal exertion, has evidence of pulmonary hypertension.  Patient still had  a large burden of PE, underwent repeated pulmonary thrombectomy 07/05 and improved as of 07/06 Oxygenation is improving. OT/PT today 07/06  SIRS criteria w/ tachypnea (likely clot burden in pulmonary vasculature) and elevated WBC (likely reactive but slowly trending up/down over course of admission) 07/01 CXR no infiltrate 07/05-07/06 WBC up,  likely inflammatory/reactive to increased clot burden, trend in AM and if still increasing will w/u   Goals of care:  03/23/22 patient voiced to nurse that he wanted to be made full code again, I came to chat with him before putting in the official orders.  Will go ahead and designate him as full code, he does note that "I would not want to be kept alive on machines indefinitely" and verbalizes that if he were to suffer cardiac/respiratory arrest and need to be placed on life support, but unable to come off of that life support, he would want these measures to be withdrawn and allow for natural death   Elevated troponin Appreciate cardiology consult.   secondary to large PE with heart strain  Reactive thrombocytosis. Acquired hemophilia. Continue argatroban drip per vascular surgery    Left lower extremity ischemia.  Status post BKA Failed effort with revascularization and thrombectomy. Start OT 07/04.  Insomnia. Has not been able to sleep in the hospital, melatonin does not seem to work. Started lower dose Seroquel.  Reviewed patient EKG, no significant QT interval prolongation. Patient slept better w/ Seroquel.   Constipation. Condition improved.   History of bladder cancer s/p resection. History of prostate cancer. Urinary stress incontinence.  Status post artificial urinary sphincter on 6/21. No new issues   Acute kidney injury. Relatively stable, continue to follow  Coronary artery disease status post PCI and stenting. Continue Plavix, statin.   Essential hypertension. Continue metoprolol.   DVT prophylaxis: argatroban gtt Code Status: FULL code Family Communication: pt declined call to family today  Disposition Plan / TOC needs: remains in SDU pending vascular clearance to deescalate, will need SNF placement  Barriers to discharge / significant pending items: pending vascular recs re: goals to meet prior to discharge, he is also still SIRS  criteria             Objective: Vitals:   03/24/22 0500 03/24/22 0600 03/24/22 0700 03/24/22 0800  BP: 110/71 109/67 112/69 (!) 95/54  Pulse: 84 82 (!) 101 80  Resp: (!) 24 20 (!) 23 (!) 22  Temp:      TempSrc:      SpO2: 99% 98% 100% 100%  Weight:      Height:        Intake/Output Summary (Last 24 hours) at 03/24/2022 0925 Last data filed at 03/24/2022 0851 Gross per 24 hour  Intake 530.42 ml  Output 1275 ml  Net -744.58 ml   Net IO Since Admission: -2,483.56 mL [03/24/22 0925]  Filed Weights   03/11/22 1915 03/16/22 1424 03/18/22 0500  Weight: 79.3 kg 79.3 kg 73.1 kg    Examination:  Constitutional:  VS as above General Appearance: alert, well-developed, well-nourished, NAD Eyes: Normal lids and conjunctive, non-icteric sclera Ears, Nose, Mouth, Throat: Normal appearance Neck: No masses, trachea midline Respiratory: Normal respiratory effort Breath sounds normal, no wheeze/rhonchi/rales Cardiovascular: S1/S2 normal, no murmur/rub/gallop auscultated No lower extremity edema on RLE Gastrointestinal: Nontender, no masses No hernia appreciated Musculoskeletal:  No clubbing/cyanosis of digits Neurological: No cranial nerve deficit on limited exam Motor and sensation intact and symmetric Psychiatric: Normal judgment/insight Anxious mood and affect       Scheduled Medications:   Chlorhexidine Gluconate Cloth  6 each Topical Daily   clopidogrel  75 mg Oral Daily  insulin aspart  0-15 Units Subcutaneous TID WC   iron polysaccharides  150 mg Oral Daily   levothyroxine  50 mcg Oral Q0600   melatonin  5 mg Oral QHS   metoprolol tartrate  12.5 mg Oral BID   pantoprazole  40 mg Oral Daily   polyethylene glycol  17 g Oral BID   pravastatin  40 mg Oral Daily   QUEtiapine  25 mg Oral QHS   sodium chloride flush  3 mL Intravenous Q12H    Continuous Infusions:  argatroban 0.5 mcg/kg/min (03/24/22 0851)    PRN Medications:  acetaminophen **OR**  acetaminophen, alum & mag hydroxide-simeth, guaiFENesin-dextromethorphan, hydrALAZINE, HYDROmorphone (DILAUDID) injection, LORazepam **OR** LORazepam, melatonin, metoprolol tartrate, ondansetron (ZOFRAN) IV, oxyCODONE-acetaminophen, phenol, sodium chloride flush, sorbitol  Antimicrobials:  Anti-infectives (From admission, onward)    Start     Dose/Rate Route Frequency Ordered Stop   03/23/22 1619  ceFAZolin (ANCEF) IVPB 1 g/50 mL premix  Status:  Discontinued        over 30 Minutes  Continuous PRN 03/23/22 1619 03/23/22 1852   03/23/22 1445  ceFAZolin (ANCEF) IVPB 2g/100 mL premix        2 g 200 mL/hr over 30 Minutes Intravenous On call to O.R. 03/23/22 1434 03/23/22 1924   03/18/22 0943  ceFAZolin (ANCEF) IVPB 2g/100 mL premix        2 g 200 mL/hr over 30 Minutes Intravenous 30 min pre-op 03/18/22 0943 03/18/22 1349   03/17/22 2200  ceFAZolin (ANCEF) IVPB 1 g/50 mL premix       Note to Pharmacy: Send with pt to OR   1 g 100 mL/hr over 30 Minutes Intravenous Every 8 hours 03/17/22 1718 03/18/22 0547   03/16/22 1508  ceFAZolin (ANCEF) 2-4 GM/100ML-% IVPB       Note to Pharmacy: Trudie Reed S: cabinet override      03/16/22 1508 03/16/22 1554   03/16/22 0600  ceFAZolin (ANCEF) IVPB 2g/100 mL premix        2 g 200 mL/hr over 30 Minutes Intravenous On call to O.R. 03/15/22 1315 03/16/22 1544   03/13/22 0600  ceFAZolin (ANCEF) IVPB 2g/100 mL premix       Note to Pharmacy: Send with pt to OR   2 g 200 mL/hr over 30 Minutes Intravenous On call 03/12/22 1948 03/12/22 2017   03/13/22 0400  ceFAZolin (ANCEF) IVPB 2g/100 mL premix        2 g 200 mL/hr over 30 Minutes Intravenous Every 8 hours 03/13/22 0048 03/13/22 1257   03/12/22 0600  ceFAZolin (ANCEF) IVPB 2g/100 mL premix        2 g 200 mL/hr over 30 Minutes Intravenous On call to O.R. 03/11/22 1525 03/11/22 1705   03/11/22 1547  ceFAZolin (ANCEF) 2-4 GM/100ML-% IVPB       Note to Pharmacy: Theadora Rama : cabinet override       03/11/22 1547 03/11/22 1635       Data Reviewed: I have personally reviewed following labs and imaging studies  CBC: Recent Labs  Lab 03/20/22 0805 03/21/22 0135 03/22/22 0409 03/23/22 0501 03/24/22 0347  WBC 15.3* 17.9* 19.9* 20.2* 22.5*  HGB 9.3* 9.4* 9.9* 9.2* 8.2*  HCT 29.6* 30.4* 32.7* 30.4* 27.0*  MCV 87.8 89.1 89.3 89.9 90.6  PLT 581* 685* 791* 768* 643*    Basic Metabolic Panel: Recent Labs  Lab 03/18/22 0519 03/20/22 0805 03/22/22 0409 03/23/22 0501  NA 135 137 139 139  K 4.7 4.3 4.3 4.3  CL 102 105 113* 111  CO2 '24 24 22 22  '$ GLUCOSE 183* 127* 147* 135*  BUN 34* 28* 28* 30*  CREATININE 1.57* 1.35* 1.39* 1.57*  CALCIUM 8.5* 8.3* 8.0* 8.2*  MG 2.2 2.4 2.2 2.2    GFR: Estimated Creatinine Clearance: 40.7 mL/min (A) (by C-G formula based on SCr of 1.57 mg/dL (H)). Liver Function Tests: No results for input(s): "AST", "ALT", "ALKPHOS", "BILITOT", "PROT", "ALBUMIN" in the last 168 hours. No results for input(s): "LIPASE", "AMYLASE" in the last 168 hours. No results for input(s): "AMMONIA" in the last 168 hours. Coagulation Profile: No results for input(s): "INR", "PROTIME" in the last 168 hours. Cardiac Enzymes: No results for input(s): "CKTOTAL", "CKMB", "CKMBINDEX", "TROPONINI" in the last 168 hours. BNP (last 3 results) No results for input(s): "PROBNP" in the last 8760 hours. HbA1C: No results for input(s): "HGBA1C" in the last 72 hours. CBG: Recent Labs  Lab 03/22/22 1705 03/23/22 0720 03/23/22 1230 03/23/22 2121 03/24/22 0745  GLUCAP 136* 136* 134* 192* 148*    Lipid Profile: No results for input(s): "CHOL", "HDL", "LDLCALC", "TRIG", "CHOLHDL", "LDLDIRECT" in the last 72 hours. Thyroid Function Tests: No results for input(s): "TSH", "T4TOTAL", "FREET4", "T3FREE", "THYROIDAB" in the last 72 hours. Anemia Panel: No results for input(s): "VITAMINB12", "FOLATE", "FERRITIN", "TIBC", "IRON", "RETICCTPCT" in the last 72 hours.  Urine  analysis:    Component Value Date/Time   COLORURINE YELLOW (A) 02/13/2022 0744   APPEARANCEUR HAZY (A) 02/13/2022 0744   APPEARANCEUR Clear 10/25/2014 2206   LABSPEC 1.018 02/13/2022 0744   LABSPEC 1.016 10/25/2014 2206   PHURINE 5.0 02/13/2022 0744   GLUCOSEU NEGATIVE 02/13/2022 0744   GLUCOSEU >=500 10/25/2014 2206   HGBUR NEGATIVE 02/13/2022 0744   BILIRUBINUR NEGATIVE 02/13/2022 0744   BILIRUBINUR Negative 10/25/2014 2206   KETONESUR NEGATIVE 02/13/2022 0744   PROTEINUR NEGATIVE 02/13/2022 0744   NITRITE NEGATIVE 02/13/2022 0744   LEUKOCYTESUR NEGATIVE 02/13/2022 0744   LEUKOCYTESUR Negative 10/25/2014 2206   Sepsis Labs: '@LABRCNTIP'$ (procalcitonin:4,lacticidven:4)  No results found for this or any previous visit (from the past 240 hour(s)).       Radiology Studies last 96 hours: PERIPHERAL VASCULAR CATHETERIZATION  Result Date: 03/23/2022 See surgical note for result.  CT Angio Chest Pulmonary Embolism (PE) W or WO Contrast  Result Date: 03/23/2022 CLINICAL DATA:  pulmonary embolism, eval for possible repeat thrombectomy EXAM: CT ANGIOGRAPHY CHEST WITH CONTRAST TECHNIQUE: Multidetector CT imaging of the chest was performed using the standard protocol during bolus administration of intravenous contrast. Multiplanar CT image reconstructions and MIPs were obtained to evaluate the vascular anatomy. RADIATION DOSE REDUCTION: This exam was performed according to the departmental dose-optimization program which includes automated exposure control, adjustment of the mA and/or kV according to patient size and/or use of iterative reconstruction technique. CONTRAST:  73m OMNIPAQUE IOHEXOL 350 MG/ML SOLN COMPARISON:  CTA 03/17/2022, CTA 12/07/2018. FINDINGS: Cardiovascular: Satisfactory opacification of the pulmonary arteries. There is increased embolic burden within the right main and lower lobe pulmonary arteries. There are additional emboli in the right upper and middle lobar and  segmental branches. Additional pulmonary emboli in the left lower lobar, segmental and subsegmental pulmonary arteries as well as segmental and subsegmental branches of the left upper lobe. There is a thin residual strand of clot at the bifurcation of the main pulmonary artery into the right and left pulmonary arteries with overall decreased clot burden in the left upper lobe arteries. There is persistent evidence of right heart strain with RV: LV ratio measuring  up to 1.6, and reflux of contrast into the IVC. Mediastinum/Nodes: No lymphadenopathy. Lungs/Pleura: There are increased peripheral ground-glass opacities in the upper lobes and superior segment of the right lower lobe. Bibasilar hypoventilatory changes. No pleural effusion. No pneumothorax. Upper Abdomen: No acute findings. Musculoskeletal: No acute osseous abnormality. There is a single 8 mm sclerotic lesion within the T4 vertebral body which is most likely a bone island, present since at least March 2020. Review of the MIP images confirms the above findings. IMPRESSION: Increased clot burden within the right pulmonary artery and right lower lobe pulmonary emboli with similar clot burden in the right middle lobe, right upper lobe, and left lower lobe. Thin residual fiber strand/clot at the bifurcation of the pulmonary artery with decreased clot burden in the left upper lobe arterial branches. Persistent right heart strain with RV:LV ratio of 1.6 and reflux of contrast into the IVC. Findings are consistent with at least submassive (intermediate risk) PE. The presence of right heart strain has been associated with an increased risk of morbidity and mortality. Please refer to the "Code PE Focused" order set in EPIC. Increased peripheral ground-glass opacities in the upper lobes and superior segment of the right lower lobe, suspicious for developing pulmonary infarcts. Critical Value/emergent results were called by telephone at the time of interpretation on  03/23/2022 at 11:59 am to provider Emeterio Reeve , who verbally acknowledged these results. Electronically Signed   By: Maurine Simmering M.D.   On: 03/23/2022 12:02            LOS: 13 days       Emeterio Reeve, DO [facility] 03/24/2022, 9:25 AM   Staff may message me via secure chat in [place]  but this may not receive immediate response,  please page for urgent matters!  If 7PM-7AM, please contact night-coverage [email or web address]  Dictation software was used to generate the above note. Typos may occur and escape review, as with typed/written notes. Please contact Dr Sheppard Coil directly for clarity if needed.

## 2022-03-24 NOTE — Progress Notes (Signed)
ANTICOAGULATION CONSULT NOTE  Pharmacy Consult for Argatroban infusion Indication: thromboembolic treatment  Patient Measurements: Height: '5\' 9"'$  (175.3 cm) Weight: 73.1 kg (161 lb 2.5 oz) IBW/kg (Calculated) : 70.7 Heparin Dosing Weight: 79.3 kg  Labs: Recent Labs    03/21/22 0135 03/21/22 0949 03/21/22 2008 03/22/22 0409 03/22/22 1352 03/22/22 2150 03/23/22 0501 03/23/22 2325  HGB 9.4*  --   --  9.9*  --   --  9.2*  --   HCT 30.4*  --   --  32.7*  --   --  30.4*  --   PLT 685*  --   --  791*  --   --  768*  --   APTT 81* 65*  --   --   --   --   --  71*  HEPARINUNFRC  --  0.31   < > 0.24* 0.40 0.42 0.47  --   CREATININE  --   --   --  1.39*  --   --  1.57*  --    < > = values in this interval not displayed.     Estimated Creatinine Clearance: 40.7 mL/min (A) (by C-G formula based on SCr of 1.57 mg/dL (H)).   Medical History: Past Medical History:  Diagnosis Date   Arthritis    Basal cell carcinoma    L clavicle, L prox forearm- removed years ago    Bladder cancer (HCC)    Bladder neck contracture    CAD (coronary artery disease)    a. 12/2020 NSTEMI/Cath: LM nl, LAD 95ost/p, LCX 50p, RCA nl. EF 45-50%.   Cataract    CKD (chronic kidney disease), stage III [facility])    ED (erectile dysfunction)    Frequency    GERD (gastroesophageal reflux disease)    History of pulmonary embolus (PE) 11/2018   a. Following LE DVT-->Chronic warfarin.   Hyperlipidemia LDL goal <70    Hypertension    Hypothyroidism    Incontinence of urine    sui, s/p cryoablation   Ischemic cardiomyopathy    a. 11/2018 Echo: EF 60-65%; b. 12/2020 LV Gram: EF 45-50% in setting of NSTEMI.   Kidney stones    Neuropathy    feet   Nocturia    Prostate cancer (HCC)    S/P   CRYOABLATION   Right elbow tendinitis    Right Lower Extremity DVT (deep venous thrombosis) (HCC) 11/25/2018   Vertigo    1-2x/yr   Wears glasses     Assessment: 76 y.o. male with past medical history of PE and recurrent  DVTs on Eliquis. Patient recently having discontinued this about 1 week ago in preparation for placement of an artificial urinary sphincter procedure on on 03/09/22 and was instructed to continue to hold Eliquis 48 hours post-op. Presents to ED on 03/11/22 with pain and duskiness in the left foot extending towards the left calf. Pharmacy has been consulted for heparin infusion for VTE.   S/P thrombectomy 6/23, additional thromboembolectomy on 6/24 PM.  Pt was on heparin gtt since 6/24 @ ~ 2000. Pt was on Eliquis 10 mg PO BID. On 6/26 he was transitioned to aggrastat drip x 18 hrs with the hope for improving his forefoot perfusion, which was completed then transitioned to apixaban with last dose administered 0904 03/17/22 now with a new PE  7/05: Patient returned to OR with vascular team for pulmonary thrombectomy. Heparin infusion was stopped and was started on angiomax during procedure, but later transitioned to argatroban.  Goal  of Therapy:  aPTT: 50-90 sec Monitor platelets by anticoagulation protocol: Yes   Plan:  7/5:  aPTT @ 2325 = 71, therapeutic X 1 Will continue pt on current rate and recheck aPTT on 7/6 with AM labs.   Siren Porrata D  03/24/2022 12:16 AM

## 2022-03-24 NOTE — Evaluation (Signed)
Occupational Therapy Evaluation Patient Details Name: Phillip [name] MRN: [id number] DOB: 04/07/1946 Today's Date: 03/24/2022   History of Present Illness Pt is a 76 y.o. 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  The patient had an artificial urinary sphincter placed 03/09/2022.  His Eliquis was held a week prior to the procedure.  1 to 2 days after being off Eliquis he started having pain in his left lower extremity.  His left lower extremity pain became progressively worse with his skin turning cold and blue and was associated with inability to bear weight on his left lower extremity.  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  Pt then developed bilateral PE and is s/p mechanical thrombectomy and thrombolysis 6/30 and additional mechanical thrombectomy on 7/5 with new orders placed.   Clinical Impression   Patient presenting with decreased Ind in self care,balance, functional mobility/transfers, endurance, and safety awareness. Patient lives with wife in [place] and is independent at baseline. He has had several complications this admission and new order placed after additional mechanical thrombectomy performed. MD clearing pt to be seen by OT for evaluation with no restrictions places. Pt stands with min A and takes several hops to the L into recliner chair. Pt on RA during session. RR increases to 30's but O2 saturation remains above 90%. Pt unable to engage in any other activity at this time secondary to needing to rest.  Patient will benefit from acute OT to increase overall independence in the areas of ADLs, functional mobility, and safety awareness in order to safely discharge to next venue of care.      Recommendations  for follow up therapy are one component of a multi-disciplinary discharge planning process, led by the attending physician.  Recommendations may be updated based on patient status, additional functional criteria and insurance authorization.   Follow Up Recommendations  Skilled nursing-short term rehab (<3 hours/day)    Assistance Recommended at Discharge Frequent or constant Supervision/Assistance  Patient can return home with the following Two people to help with walking and/or transfers;A lot of help with bathing/dressing/bathroom;Assistance with cooking/housework;Assist for transportation;Help with stairs or ramp for entrance    Functional Status Assessment  Patient has had a recent decline in their functional status and demonstrates the ability to make significant improvements in function in a reasonable and predictable amount of time.  Equipment Recommendations  Other (comment) (defer to next venue of care)       Precautions / Restrictions Precautions Precautions: Fall Precaution Comments: MD contacted via secure chat and cleared therapist to evaluate pt. No restrictions given. Restrictions Weight Bearing Restrictions: Yes LLE Weight Bearing: Non weight bearing Other Position/Activity Restrictions: Recent L BKA      Mobility Bed Mobility Overal bed mobility: Needs Assistance Bed Mobility: Supine to Sit     Supine to sit: Min assist     General bed mobility comments: for trunk support to EOB    Transfers Overall transfer level: Needs assistance Equipment used: Rolling walker (2 wheels) Transfers: Sit to/from Stand, Bed to chair/wheelchair/BSC Sit to Stand: Min assist     Step pivot transfers: Mod assist     General transfer comment: Pt needing assistance to advance/manage RW for several hops with RW into  recliner chair.      Balance Overall balance assessment: Needs assistance Sitting-balance support: Single extremity supported Sitting balance-Leahy Scale:  Good Sitting balance - Comments: pt more comfortable with BUE support   Standing balance support: Reliant on assistive device for balance, Bilateral upper extremity supported, During functional activity Standing balance-Leahy Scale: Poor                             ADL either performed or assessed with clinical judgement      Vision Patient Visual Report: No change from baseline              Pertinent Vitals/Pain Pain Assessment Pain Assessment: Faces Faces Pain Scale: Hurts a little bit Pain Location: LLE Pain Descriptors / Indicators: Discomfort Pain Intervention(s): Monitored during session, Repositioned     Hand Dominance Right   Extremity/Trunk Assessment Upper Extremity Assessment Upper Extremity Assessment: Overall WFL for tasks assessed           Communication Communication Communication: No difficulties   Cognition Arousal/Alertness: Awake/alert Behavior During Therapy: WFL for tasks assessed/performed Overall Cognitive Status: Within Functional Limits for tasks assessed                                 General Comments: Pt is A &O x4 and very pleasant and cooperative                Home Living Family/patient expects to be discharged to:: Private residence Living Arrangements: Spouse/significant other Available Help at Discharge: Available 24 hours/day Type of Home: Other(Comment) (condo) Home Access: Stairs to enter [employer] of Steps: 1 Entrance Stairs-Rails: Right Home Layout: Two level;Able to live on main level with bedroom/bathroom     Bathroom Shower/Tub: Hospital doctor Toilet: Handicapped height     Home Equipment: Conservation officer, nature (2 wheels);Cane - single point;Shower seat          Prior Functioning/Environment Prior Level of Function : Independent/Modified Independent             Mobility Comments: indep without AD ADLs Comments: indep in ADL/IADL, works part time at a  golf course, retired Production assistant, radio        OT Problem List: Decreased strength;Decreased range of motion;Decreased activity tolerance;Impaired balance (sitting and/or standing);Decreased safety awareness;Pain;Decreased knowledge of use of DME or AE      OT Treatment/Interventions: Self-care/ADL training;Therapeutic exercise;Energy conservation;DME and/or AE instruction;Therapeutic activities;Patient/family education;Balance training    OT Goals(Current goals can be found in the care plan section) Acute Rehab OT Goals Patient Stated Goal: to get stronger and go to rehab OT Goal Formulation: With patient Time For Goal Achievement: 04/02/22 Potential to Achieve Goals: Good  OT Frequency: Min 3X/week       AM-PAC OT "6 Clicks" Daily Activity     Outcome Measure Help from another person eating meals?: None Help from another person taking care of personal grooming?: A Little Help from another person toileting, which includes using toliet, bedpan, or urinal?: A Lot Help from another person bathing (including washing, rinsing, drying)?: A Lot Help from another person to put on and taking off regular upper body clothing?: A Little Help from another person to put on and taking off regular lower body clothing?: A Lot 6 Click Score: 16   End of Session Equipment Utilized During Treatment: Rolling walker (2 wheels) Nurse Communication: Mobility status  Activity Tolerance: Patient tolerated treatment well Patient left: with call bell/phone within reach;in chair  OT Visit Diagnosis: Unsteadiness on feet (R26.81);Repeated falls (R29.6);Muscle weakness (generalized) (M62.81)                Time: 4196-2229 OT Time Calculation (min): 13 min Charges:  OT General Charges $OT Visit: 1 Visit OT Evaluation $OT Eval Moderate Complexity: 1 [address] MS, OTR/L , CBIS ascom [phone]  03/24/22, 3:24 PM

## 2022-03-24 NOTE — Plan of Care (Signed)
Pt had uneventful day, VSS, progressing slowly toward goal, remain on RA all day, pt was assisted by two too from bed to chair and sat for 2 hours, pt did not tolerated activity well  increase SOB and took pt very long time to recover from SOB during activity.  This evening pt dangle on the bed per his requested and same as above event. Extreme SOB with activity.

## 2022-03-24 NOTE — Consult Note (Addendum)
   [facility] CM Inpatient Consult   03/24/2022  Phillip [name] 20-Mar-1946 [id number]  [facility] [[facility]] Patient: Phillip [name]  Primary Care Provider:  Rusty Aus, MD, [facility]  Remote review high risk, patient at [facility]  Patient screened for length of stay [12 day] hospitalization with noted extreme high risk score for unplanned readmission risk.  Patient with 1 admission in the past 6 months noted with 4 ED visits.  Reviewed to assess for potential [place] Management service needs for post hospital transition.  Review of patient's medical record reveals patient is in Stepdown level of care at the time of this review.  Plan:  Continue to follow progress and disposition to assess for post hospital care management needs.  Awaiting PT/OT evals, inpatient TOC team review as well.  Patient with ongoing medical/surgical management noted. Addendum: previous recommendation was for SNF- rehab.  For questions contact:   Natividad Brood, RN BSN [facility] Liaison  [phone] business mobile phone Toll free office [phone]  Fax number: [phone] [email or web address]

## 2022-03-24 NOTE — TOC Progression Note (Signed)
Transition of Care [facility]) - Progression Note    Patient Details  Name: Phillip [name] MRN: [id number] Date of Birth: May 31, 1946  Transition of Care [facility]) CM/SW [place], [place] Phone Number: 03/24/2022, 1:54 PM  Clinical Narrative:     CSW spoke with patient's spouse Levada Dy regarding PT recs of SNF, she reports she needs to speak with her family about this and get back with this CSW tomorrow on their decision if they will be in agreement with SNF or not.   Expected Discharge Plan:  (TBD) Barriers to Discharge: Continued Medical Work up  Expected Discharge Plan and Services Expected Discharge Plan:  (TBD)       Living arrangements for the past 2 months: Single Family Home                                       Social Determinants of Health (SDOH) Interventions    Readmission Risk Interventions     No data to display

## 2022-03-25 DIAGNOSIS — Z48816 Encounter for surgical aftercare following surgery on the genitourinary system: Secondary | ICD-10-CM

## 2022-03-25 DIAGNOSIS — I2699 Other pulmonary embolism without acute cor pulmonale: Secondary | ICD-10-CM | POA: Diagnosis not present

## 2022-03-25 DIAGNOSIS — I82413 Acute embolism and thrombosis of femoral vein, bilateral: Secondary | ICD-10-CM | POA: Diagnosis not present

## 2022-03-25 DIAGNOSIS — I70248 Atherosclerosis of native arteries of left leg with ulceration of other part of lower left leg: Secondary | ICD-10-CM | POA: Diagnosis not present

## 2022-03-25 LAB — CBC
HCT: 26 % — ABNORMAL LOW (ref 39.0–52.0)
Hemoglobin: 8 g/dL — ABNORMAL LOW (ref 13.0–17.0)
MCH: 27.5 pg (ref 26.0–34.0)
MCHC: 30.8 g/dL (ref 30.0–36.0)
MCV: 89.3 fL (ref 80.0–100.0)
Platelets: 565 10*3/uL — ABNORMAL HIGH (ref 150–400)
RBC: 2.91 MIL/uL — ABNORMAL LOW (ref 4.22–5.81)
RDW: 18 % — ABNORMAL HIGH (ref 11.5–15.5)
WBC: 23 10*3/uL — ABNORMAL HIGH (ref 4.0–10.5)
nRBC: 0.7 % — ABNORMAL HIGH (ref 0.0–0.2)

## 2022-03-25 LAB — BASIC METABOLIC PANEL
Anion gap: 7 (ref 5–15)
BUN: 49 mg/dL — ABNORMAL HIGH (ref 8–23)
CO2: 20 mmol/L — ABNORMAL LOW (ref 22–32)
Calcium: 7.9 mg/dL — ABNORMAL LOW (ref 8.9–10.3)
Chloride: 107 mmol/L (ref 98–111)
Creatinine, Ser: 2.02 mg/dL — ABNORMAL HIGH (ref 0.61–1.24)
GFR, Estimated: 34 mL/min — ABNORMAL LOW (ref >60)
Glucose, Bld: 151 mg/dL — ABNORMAL HIGH (ref 70–99)
Potassium: 4.4 mmol/L (ref 3.5–5.1)
Sodium: 134 mmol/L — ABNORMAL LOW (ref 135–145)

## 2022-03-25 LAB — GLUCOSE, CAPILLARY
Glucose-Capillary: 160 mg/dL — ABNORMAL HIGH (ref 70–99)
Glucose-Capillary: 174 mg/dL — ABNORMAL HIGH (ref 70–99)
Glucose-Capillary: 159 mg/dL — ABNORMAL HIGH (ref 70–99)
Glucose-Capillary: 200 mg/dL — ABNORMAL HIGH (ref 70–99)

## 2022-03-25 LAB — CARDIOLIPIN ANTIBODIES, IGG, IGM, IGA
Anticardiolipin IgA: <9 APL U/mL (ref 0–11)
Anticardiolipin IgG: <9 GPL U/mL (ref 0–14)
Anticardiolipin IgM: <9 MPL U/mL (ref 0–12)

## 2022-03-25 LAB — RETIC PANEL
Immature Retic Fract: 36 % — ABNORMAL HIGH (ref 2.3–15.9)
RBC.: 3.03 MIL/uL — ABNORMAL LOW (ref 4.22–5.81)
Retic Count, Absolute: 194.8 10*3/uL — ABNORMAL HIGH (ref 19.0–186.0)
Retic Ct Pct: 6.4 % — ABNORMAL HIGH (ref 0.4–3.1)
Reticulocyte Hemoglobin: 26.6 pg — ABNORMAL LOW (ref >27.9)

## 2022-03-25 LAB — BETA-2-GLYCOPROTEIN I ABS, IGG/M/A
Beta-2 Glyco I IgG: <9 GPI IgG units (ref 0–20)
Beta-2-Glycoprotein I IgA: <9 GPI IgA units (ref 0–25)
Beta-2-Glycoprotein I IgM: <9 GPI IgM units (ref 0–32)

## 2022-03-25 LAB — APTT: aPTT: 63 seconds — ABNORMAL HIGH (ref 24–36)

## 2022-03-25 LAB — HOMOCYSTEINE: Homocysteine: 18.8 umol/L (ref 0.0–19.2)

## 2022-03-25 MED ORDER — WARFARIN SODIUM 5 MG PO TABS
5.0000 mg | ORAL_TABLET | Freq: Once | ORAL | Status: AC
Start: 2022-03-25 — End: 2022-03-25
  Administered 2022-03-25: 5 mg via ORAL
  Filled 2022-03-25: qty 1

## 2022-03-25 MED ORDER — WARFARIN - PHARMACIST DOSING INPATIENT: Freq: Every day | Status: DC

## 2022-03-25 NOTE — Progress Notes (Signed)
PROGRESS NOTE    [name]  PFX:[id number] DOB: [birth year]  DOA: 03/11/2022 Date of Service: 03/25/22 PCP: Rusty Aus, MD     Brief Narrative / Hospital Course:  Phillip [name] is a 76 y.o. male with medical history significant for recurrent DVTs, post operative PE on Eliquis, HTN, HLD, malignant neoplasm of prostate post resection, osteoarthritis, CAD status post PCI with stenting on Plavix, chronic diastolic CHF, who presents to [facility] ED 03/11/22 from his PCP's office, Dr. Sabra Heck, due to concern for critical left lower limb ischemia.  The patient had an artificial urinary sphincter placed on Wednesday, 03/09/2022.  His Eliquis was held a week prior to the procedure.  1 to 2 days after being off Eliquis he started having pain in his left lower extremity.  His left lower extremity pain became progressively worse, 12/10 in the last 24 hours Patient was seen by vascular surgery, had a lower extremity bypass surgery on 6/23.  Went back to the OR on 6/24 for L LE thrombectomy.  Patient also placed on heparin drip. L leg unsalvageable, BKA scheduled. 6/28.  Developed episode of PSVT, hypoxia.  Appears to be flash pulm edema, given IV Lasix. BKA performed on 6/28. 6/30.  Patient developed hypoxia and tachycardia again with atrial fibrillation.  CT angiogram was performed 6/29 showed bilateral PE.  Thrombectomy performed on 6/30, heparin drip restarted. 7/1.  Patient had another episode of hypoxemia, requiring high flow oxygen.  Duplex ultrasound showed bilateral DVT, IVC filter placed. 7/5: repeat CTA, increased clot burden on R and evidence of R heart strain, L appears improved. Planning for thrombectomy this afternoon. Cardiology has s/o  7/6: BP soft and tachypnea noted, reg HR this AM but increased later in the day, afebrile, SpO2 100% RA. Subjective improvement, no CP/SOB. Remains on argatroban infusion per vascular recs.  Consult placed to hematology to assist with guidance on  anticoagulation going forward.   Consultants:  Vascular Surgery Cardiology   Procedures: 03/16/22: L BKA 03/18/22: thrombectomy bilateral PE 03/19/22: IVC filter placed  03/23/22: thrombectomy bilateral PE    Subjective: Patient states he is overall feeling well, but better compared to yesterday.  Denies shortness of breath or chest pain.     ASSESSMENT & PLAN:   Principal Problem:   Arterial occlusion Active Problems:   Bilateral pulmonary embolism (HCC)   HTN (hypertension)   History of prostate cancer   Coronary artery disease   AKI (acute kidney injury) ([place])   Critical limb ischemia of left lower extremity with ulceration of lower leg (HCC)   Acute respiratory failure with hypoxia (HCC)   Acute on chronic diastolic CHF (congestive heart failure) (HCC)   PSVT (paroxysmal supraventricular tachycardia) (HCC)   Paroxysmal atrial fibrillation with RVR (HCC)   Acute bilateral deep vein thrombosis (DVT) of femoral veins (HCC)   Reactive thrombocytosis   Goals of care, counseling/discussion   No problem-specific Assessment & Plan notes found for this encounter.  Acute hypoxemic respiratory failure Bilateral pulmonary emboli. Bilateral lower extremity DVT. Flash pulmonary edema secondary to acute on chronic diastolic congestive heart failure. Paroxysmal A fib Patient is status post pulmonary thrombectomy.  IVC filter placed 7/1 due to bilateral lower extremity DVT. Patient appears to have recurrent PE even on therapeutic heparin drip.  As result, IVC filter was placed, continuin on heparin gtt per vascular  Significant short of breath with minimal exertion, has evidence of pulmonary hypertension.  Patient still had  a large burden of PE, underwent  repeated pulmonary thrombectomy 07/05 and improved as of 07/06 Oxygenation is improving. OT/PT recommending SNF, placement pending Hematology consulted regarding anticoagulant plan moving forward  SIRS criteria w/ tachypnea  (likely clot burden in pulmonary vasculature) and elevated WBC (likely reactive but slowly trending up/down over course of admission) 07/01 CXR no infiltrate 07/05-07/06 WBC up, likely inflammatory/reactive to increased clot burden, appreciate hematology input  Goals of care:  03/23/22 patient voiced to nurse that he wanted to be made full code again, I came to chat with him before putting in the official orders.  Will go ahead and designate him as full code, he does note that "I would not want to be kept alive on machines indefinitely" and verbalizes that if he were to suffer cardiac/respiratory arrest and need to be placed on life support, but unable to come off of that life support, he would want these measures to be withdrawn and allow for natural death   Reactive thrombocytosis. Acquired hemophilia. Continue argatroban drip per vascular surgery Hypercoag studies pending Request recs from hematology re: outpatient anticoag plan vs need inpatient hematology consult     Left lower extremity ischemia.  Status post BKA Failed effort with revascularization and thrombectomy. Start OT 07/04.  Insomnia. Patient slept better w/ Seroquel but still insomniac Added trazodone     History of bladder cancer s/p resection. History of prostate cancer. Urinary stress incontinence.  Status post artificial urinary sphincter on 6/21. No new issues   Acute kidney injury. Relatively stable, continue to follow Cr up a bit 07/07 possible effect of recent contrast, will follow w/ am labs again tomorrow   Coronary artery disease status post PCI and stenting. Continue Plavix, statin.   Essential hypertension. Continue metoprolol.   DVT prophylaxis: argatroban gtt Code Status: FULL code Family Communication: pt declined call to family today  Disposition Plan / TOC needs: remains in SDU pending vascular clearance to deescalate, will need SNF placement  Barriers to discharge / significant pending  items: pending vascular and hematology recs re: goals to meet prior to discharge, he is also still SIRS criteria, AKI, WBC up              Objective: Vitals:   03/25/22 0300 03/25/22 0400 03/25/22 0500 03/25/22 0600  BP: (!) 118/53 110/70 127/68 93/63  Pulse: (!) 104 88 87 86  Resp: 19 20 (!) 22 20  Temp:   98 F (36.7 C)   TempSrc:   Oral   SpO2: 100% 100% 100% 99%  Weight:      Height:        Intake/Output Summary (Last 24 hours) at 03/25/2022 0947 Last data filed at 03/25/2022 0900 Gross per 24 hour  Intake 862.06 ml  Output 401 ml  Net 461.06 ml   Net IO Since Admission: -2,022.5 mL [03/25/22 0947]  Filed Weights   03/11/22 1915 03/16/22 1424 03/18/22 0500  Weight: 79.3 kg 79.3 kg 73.1 kg    Examination:  Constitutional:  VS as above General Appearance: alert, well-developed, well-nourished, NAD Eyes: Normal lids and conjunctive, non-icteric sclera Ears, Nose, Mouth, Throat: Normal appearance Neck: No masses, trachea midline Respiratory: Normal respiratory effort Breath sounds normal, no wheeze/rhonchi/rales Cardiovascular: S1/S2 normal, no murmur/rub/gallop auscultated No lower extremity edema on RLE Gastrointestinal: Nontender, no masses No hernia appreciated Musculoskeletal:  No clubbing/cyanosis of digits Neurological: No cranial nerve deficit on limited exam Motor and sensation intact and symmetric Psychiatric: Normal judgment/insight Anxious mood and affect       Scheduled Medications:  Chlorhexidine Gluconate Cloth  6 each Topical Daily   clopidogrel  75 mg Oral Daily   insulin aspart  0-15 Units Subcutaneous TID WC   iron polysaccharides  150 mg Oral Daily   levothyroxine  50 mcg Oral Q0600   melatonin  5 mg Oral QHS   metoprolol tartrate  12.5 mg Oral BID   pantoprazole  40 mg Oral Daily   polyethylene glycol  17 g Oral BID   pravastatin  40 mg Oral Daily   QUEtiapine  25 mg Oral QHS   sodium chloride flush  3 mL  Intravenous Q12H    Continuous Infusions:  argatroban 0.5 mcg/kg/min (03/25/22 0511)    PRN Medications:  acetaminophen **OR** acetaminophen, alum & mag hydroxide-simeth, guaiFENesin-dextromethorphan, hydrALAZINE, HYDROmorphone (DILAUDID) injection, LORazepam **OR** LORazepam, melatonin, metoprolol tartrate, ondansetron (ZOFRAN) IV, oxyCODONE-acetaminophen, phenol, sodium chloride flush, sorbitol, traZODone  Antimicrobials:  Anti-infectives (From admission, onward)    Start     Dose/Rate Route Frequency Ordered Stop   03/23/22 1619  ceFAZolin (ANCEF) IVPB 1 g/50 mL premix  Status:  Discontinued        over 30 Minutes  Continuous PRN 03/23/22 1619 03/23/22 1852   03/23/22 1445  ceFAZolin (ANCEF) IVPB 2g/100 mL premix        2 g 200 mL/hr over 30 Minutes Intravenous On call to O.R. 03/23/22 1434 03/23/22 1924   03/18/22 0943  ceFAZolin (ANCEF) IVPB 2g/100 mL premix        2 g 200 mL/hr over 30 Minutes Intravenous 30 min pre-op 03/18/22 0943 03/18/22 1349   03/17/22 2200  ceFAZolin (ANCEF) IVPB 1 g/50 mL premix       Note to Pharmacy: Send with pt to OR   1 g 100 mL/hr over 30 Minutes Intravenous Every 8 hours 03/17/22 1718 03/18/22 0547   03/16/22 1508  ceFAZolin (ANCEF) 2-4 GM/100ML-% IVPB       Note to Pharmacy: Trudie Reed S: cabinet override      03/16/22 1508 03/16/22 1554   03/16/22 0600  ceFAZolin (ANCEF) IVPB 2g/100 mL premix        2 g 200 mL/hr over 30 Minutes Intravenous On call to O.R. 03/15/22 1315 03/16/22 1544   03/13/22 0600  ceFAZolin (ANCEF) IVPB 2g/100 mL premix       Note to Pharmacy: Send with pt to OR   2 g 200 mL/hr over 30 Minutes Intravenous On call 03/12/22 1948 03/12/22 2017   03/13/22 0400  ceFAZolin (ANCEF) IVPB 2g/100 mL premix        2 g 200 mL/hr over 30 Minutes Intravenous Every 8 hours 03/13/22 0048 03/13/22 1257   03/12/22 0600  ceFAZolin (ANCEF) IVPB 2g/100 mL premix        2 g 200 mL/hr over 30 Minutes Intravenous On call to O.R. 03/11/22  1525 03/11/22 1705   03/11/22 1547  ceFAZolin (ANCEF) 2-4 GM/100ML-% IVPB       Note to Pharmacy: Theadora Rama : cabinet override      03/11/22 1547 03/11/22 1635       Data Reviewed: I have personally reviewed following labs and imaging studies  CBC: Recent Labs  Lab 03/21/22 0135 03/22/22 0409 03/23/22 0501 03/24/22 0347 03/25/22 0443  WBC 17.9* 19.9* 20.2* 22.5* 23.0*  HGB 9.4* 9.9* 9.2* 8.2* 8.0*  HCT 30.4* 32.7* 30.4* 27.0* 26.0*  MCV 89.1 89.3 89.9 90.6 89.3  PLT 685* 791* 768* 643* 565*    Basic Metabolic Panel: Recent Labs  Lab 03/20/22  0805 03/22/22 0409 03/23/22 0501 03/25/22 0443  NA 137 139 139 134*  K 4.3 4.3 4.3 4.4  CL 105 113* 111 107  CO2 '24 22 22 '$ 20*  GLUCOSE 127* 147* 135* 151*  BUN 28* 28* 30* 49*  CREATININE 1.35* 1.39* 1.57* 2.02*  CALCIUM 8.3* 8.0* 8.2* 7.9*  MG 2.4 2.2 2.2  --     GFR: Estimated Creatinine Clearance: 31.6 mL/min (A) (by C-G formula based on SCr of 2.02 mg/dL (H)). Liver Function Tests: No results for input(s): "AST", "ALT", "ALKPHOS", "BILITOT", "PROT", "ALBUMIN" in the last 168 hours. No results for input(s): "LIPASE", "AMYLASE" in the last 168 hours. No results for input(s): "AMMONIA" in the last 168 hours. Coagulation Profile: No results for input(s): "INR", "PROTIME" in the last 168 hours. Cardiac Enzymes: No results for input(s): "CKTOTAL", "CKMB", "CKMBINDEX", "TROPONINI" in the last 168 hours. BNP (last 3 results) No results for input(s): "PROBNP" in the last 8760 hours. HbA1C: No results for input(s): "HGBA1C" in the last 72 hours. CBG: Recent Labs  Lab 03/24/22 0745 03/24/22 1130 03/24/22 1620 03/24/22 2108 03/25/22 0754  GLUCAP 148* 252* 182* 182* 160*    Lipid Profile: No results for input(s): "CHOL", "HDL", "LDLCALC", "TRIG", "CHOLHDL", "LDLDIRECT" in the last 72 hours. Thyroid Function Tests: No results for input(s): "TSH", "T4TOTAL", "FREET4", "T3FREE", "THYROIDAB" in the last 72  hours. Anemia Panel: No results for input(s): "VITAMINB12", "FOLATE", "FERRITIN", "TIBC", "IRON", "RETICCTPCT" in the last 72 hours.  Urine analysis:    Component Value Date/Time   COLORURINE YELLOW (A) 02/13/2022 0744   APPEARANCEUR HAZY (A) 02/13/2022 0744   APPEARANCEUR Clear 10/25/2014 2206   LABSPEC 1.018 02/13/2022 0744   LABSPEC 1.016 10/25/2014 2206   PHURINE 5.0 02/13/2022 0744   GLUCOSEU NEGATIVE 02/13/2022 0744   GLUCOSEU >=500 10/25/2014 2206   HGBUR NEGATIVE 02/13/2022 0744   BILIRUBINUR NEGATIVE 02/13/2022 0744   BILIRUBINUR Negative 10/25/2014 2206   KETONESUR NEGATIVE 02/13/2022 0744   PROTEINUR NEGATIVE 02/13/2022 0744   NITRITE NEGATIVE 02/13/2022 0744   LEUKOCYTESUR NEGATIVE 02/13/2022 0744   LEUKOCYTESUR Negative 10/25/2014 2206   Sepsis Labs: '@LABRCNTIP'$ (procalcitonin:4,lacticidven:4)  No results found for this or any previous visit (from the past 240 hour(s)).       Radiology Studies last 96 hours: PERIPHERAL VASCULAR CATHETERIZATION  Result Date: 03/23/2022 See surgical note for result.  CT Angio Chest Pulmonary Embolism (PE) W or WO Contrast  Result Date: 03/23/2022 CLINICAL DATA:  pulmonary embolism, eval for possible repeat thrombectomy EXAM: CT ANGIOGRAPHY CHEST WITH CONTRAST TECHNIQUE: Multidetector CT imaging of the chest was performed using the standard protocol during bolus administration of intravenous contrast. Multiplanar CT image reconstructions and MIPs were obtained to evaluate the vascular anatomy. RADIATION DOSE REDUCTION: This exam was performed according to the departmental dose-optimization program which includes automated exposure control, adjustment of the mA and/or kV according to patient size and/or use of iterative reconstruction technique. CONTRAST:  71m OMNIPAQUE IOHEXOL 350 MG/ML SOLN COMPARISON:  CTA 03/17/2022, CTA 12/07/2018. FINDINGS: Cardiovascular: Satisfactory opacification of the pulmonary arteries. There is increased  embolic burden within the right main and lower lobe pulmonary arteries. There are additional emboli in the right upper and middle lobar and segmental branches. Additional pulmonary emboli in the left lower lobar, segmental and subsegmental pulmonary arteries as well as segmental and subsegmental branches of the left upper lobe. There is a thin residual strand of clot at the bifurcation of the main pulmonary artery into the right and left pulmonary arteries with overall  decreased clot burden in the left upper lobe arteries. There is persistent evidence of right heart strain with RV: LV ratio measuring up to 1.6, and reflux of contrast into the IVC. Mediastinum/Nodes: No lymphadenopathy. Lungs/Pleura: There are increased peripheral ground-glass opacities in the upper lobes and superior segment of the right lower lobe. Bibasilar hypoventilatory changes. No pleural effusion. No pneumothorax. Upper Abdomen: No acute findings. Musculoskeletal: No acute osseous abnormality. There is a single 8 mm sclerotic lesion within the T4 vertebral body which is most likely a bone island, present since at least March 2020. Review of the MIP images confirms the above findings. IMPRESSION: Increased clot burden within the right pulmonary artery and right lower lobe pulmonary emboli with similar clot burden in the right middle lobe, right upper lobe, and left lower lobe. Thin residual fiber strand/clot at the bifurcation of the pulmonary artery with decreased clot burden in the left upper lobe arterial branches. Persistent right heart strain with RV:LV ratio of 1.6 and reflux of contrast into the IVC. Findings are consistent with at least submassive (intermediate risk) PE. The presence of right heart strain has been associated with an increased risk of morbidity and mortality. Please refer to the "Code PE Focused" order set in EPIC. Increased peripheral ground-glass opacities in the upper lobes and superior segment of the right lower  lobe, suspicious for developing pulmonary infarcts. Critical Value/emergent results were called by telephone at the time of interpretation on 03/23/2022 at 11:59 am to provider Emeterio Reeve , who verbally acknowledged these results. Electronically Signed   By: Maurine Simmering M.D.   On: 03/23/2022 12:02            LOS: 14 days       Emeterio Reeve, DO [facility] 03/25/2022, 9:47 AM   Staff may message me via secure chat in [place]  but this may not receive immediate response,  please page for urgent matters!  If 7PM-7AM, please contact night-coverage [email or web address]  Dictation software was used to generate the above note. Typos may occur and escape review, as with typed/written notes. Please contact Dr Sheppard Coil directly for clarity if needed.

## 2022-03-25 NOTE — Progress Notes (Signed)
[facility]  Daily Progress Note   Subjective  - 2 Days Post-Op  Patient sitting in bed comfortable he is off oxygen and satting 100%.  He is concerned because he was started back on Coumadin today.  Objective Vitals:   03/25/22 1300 03/25/22 1400 03/25/22 1500 03/25/22 1600  BP: 106/71 (!) 109/56 112/64 112/61  Pulse: 84 98 74 79  Resp: 17 (!) 41 18 (!) 22  Temp:      TempSrc:      SpO2: 100% 100% 100% 100%  Weight:      Height:        Intake/Output Summary (Last 24 hours) at 03/25/2022 1956 Last data filed at 03/25/2022 1800 Gross per 24 hour  Intake 1112.47 ml  Output 400 ml  Net 712.47 ml    PULM  Normal effort , no use of accessory muscles CV  No JVD, RRR Abd      No distended, nontender VASC  groin clean dry and intact, left below-knee amputation  Laboratory CBC    Component Value Date/Time   WBC 23.0 (H) 03/25/2022 0443   HGB 8.0 (L) 03/25/2022 0443   HGB 12.7 (L) 10/25/2014 2206   HCT 26.0 (L) 03/25/2022 0443   HCT 40.9 10/25/2014 2206   PLT 565 (H) 03/25/2022 0443   PLT 188 10/25/2014 2206    BMET    Component Value Date/Time   NA 134 (L) 03/25/2022 0443   NA 141 10/25/2014 2206   K 4.4 03/25/2022 0443   K 4.0 10/25/2014 2206   CL 107 03/25/2022 0443   CL 109 (H) 10/25/2014 2206   CO2 20 (L) 03/25/2022 0443   CO2 26 10/25/2014 2206   GLUCOSE 151 (H) 03/25/2022 0443   GLUCOSE 210 (H) 10/25/2014 2206   BUN 49 (H) 03/25/2022 0443   BUN 21 08/15/2016 1618   BUN 15 10/25/2014 2206   CREATININE 2.02 (H) 03/25/2022 0443   CREATININE 1.32 (H) 10/25/2014 2206   CALCIUM 7.9 (L) 03/25/2022 0443   CALCIUM 8.2 (L) 10/25/2014 2206   GFRNONAA 34 (L) 03/25/2022 0443   GFRNONAA 58 (L) 10/25/2014 2206   GFRAA >60 03/27/2019 0637   GFRAA >60 10/25/2014 2206    Assessment/Planning: POD #9 s/p left below-knee amputation; postop day #2 status post redo pulmonary thrombectomy  Clearly patient is much improved status post the pulmonary  thrombectomy and subsequently treatment with Aggrastat and then argatroban.  He is questioning the reinitiation of Coumadin.  He is not an Eliquis failure he actually had done quite well on Eliquis for quite some time.  This all began after he had stopped his Eliquis for nearly a week prior to his surgery and had not yet restarted it.  I would advocate for resumption of his Eliquis at 10 mg p.o. twice daily for 1 week and then 5 mg twice daily thereafter with the addition of either Plavix 75 mg p.o. daily or an 81 mg of aspirin daily.  The patient would much prefer this as well.  Strongly agree with continuing physical therapy.  Okay to wrap his below-knee amputation for support.   [name]  03/25/2022, 7:56 PM

## 2022-03-25 NOTE — NC FL2 (Signed)
[place] LEVEL OF CARE SCREENING TOOL     IDENTIFICATION  Patient Name: Phillip [name] Birthdate: 05/06/1946 Sex: male Admission Date (Current Location): 03/11/2022  [facility] and [place] Number:  Engineering geologist and Address:  [facility], [address] [place], [place] [address]      Provider Number: [id number]  Attending Physician Name and Address:  Emeterio Reeve, DO  Relative Name and Phone Number:  Levada Dy (spouse) [phone]    Current Level of Care: Hospital Recommended Level of Care: [place] Prior Approval Number:    Date Approved/Denied:   PASRR Number: [id number]  Discharge Plan: SNF    Current Diagnoses: Patient Active Problem List   Diagnosis Date Noted   Goals of care, counseling/discussion 03/23/2022   Reactive thrombocytosis 03/21/2022   Paroxysmal atrial fibrillation with RVR ([place]) 03/20/2022   Acute bilateral deep vein thrombosis (DVT) of femoral veins (HCC) 03/20/2022   Acute respiratory failure with hypoxia ([place]) 03/16/2022   Acute on chronic diastolic CHF (congestive heart failure) ([place]) 03/16/2022   PSVT (paroxysmal supraventricular tachycardia) ([place]) 03/16/2022   AKI (acute kidney injury) ([place]) 03/15/2022   Critical limb ischemia of left lower extremity with ulceration of lower leg ([place]) 03/15/2022   Arterial occlusion 03/11/2022   Elevated troponin    Unstable angina ([place]) 01/05/2021   Hyperlipidemia LDL goal <70    CKD (chronic kidney disease), stage III (HCC)    Coronary artery disease    NSTEMI (non-ST elevated myocardial infarction) ([place]) 12/23/2020   Acquired hypothyroidism 04/20/2020   Chemotherapy-induced neuropathy ([place]) 04/20/2020   Nonthrombocytopenic purpura ([place]) 12/03/2019   Hypercoagulable state ([place]) 12/10/2018   Acute deep vein thrombosis (DVT) of calf muscle vein of right lower extremity (HCC)    Bilateral pulmonary embolism (HCC) 12/07/2018   GERD  (gastroesophageal reflux disease) 12/07/2018   HTN (hypertension) 12/07/2018   HLD (hyperlipidemia) 12/07/2018   Prostate cancer ([place]) 12/07/2018   Bladder cancer ([place]) 12/07/2018   DM type 2 with diabetic mixed hyperlipidemia ([place]) 04/04/2018   Pain in right hand 10/04/2017   Tubular adenoma 15/94/5859   Helicobacter pylori (H. pylori) infection 09/04/2017   Medicare annual wellness visit, initial 03/31/2017   History of prostate cancer 10/27/2014   Lower urinary tract symptoms (LUTS) 03/18/2012   Nephrolithiasis 03/18/2012    Orientation RESPIRATION BLADDER Height & Weight     Self, Time, Situation, Place  Normal Incontinent, External catheter Weight: 161 lb 2.5 oz (73.1 kg) Height:  '5\' 9"'$  (175.3 cm)  BEHAVIORAL SYMPTOMS/MOOD NEUROLOGICAL BOWEL NUTRITION STATUS      Incontinent Diet (see discharge summary)  AMBULATORY STATUS COMMUNICATION OF NEEDS Skin   Extensive Assist Verbally Other (Comment) (left groin and left leg closed incision, perineum medial closed incision)                       Personal Care Assistance Level of Assistance  Bathing, Dressing, Total care, Feeding Bathing Assistance: Maximum assistance Feeding assistance: Independent Dressing Assistance: Maximum assistance Total Care Assistance: Maximum assistance   Functional Limitations Info  Sight, Hearing, Speech Sight Info: Impaired Hearing Info: Adequate Speech Info: Adequate    SPECIAL CARE FACTORS FREQUENCY  PT (By licensed PT), OT (By licensed OT)     PT Frequency: min 4x weekly OT Frequency: min 4x weekly            Contractures Contractures Info: Not present    Additional Factors Info  Code Status, Allergies Code Status  Info: full Allergies Info: doxazosin           Current Medications (03/25/2022):  This is the current hospital active medication list Current Facility-Administered Medications  Medication Dose Route Frequency Provider Last Rate Last Admin   acetaminophen  (TYLENOL) tablet 325-650 mg  325-650 mg Oral Q4H PRN Schnier, Dolores Lory, MD   650 mg at 03/23/22 2003   Or   acetaminophen (TYLENOL) suppository 325-650 mg  325-650 mg Rectal Q4H PRN Schnier, Dolores Lory, MD       alum & mag hydroxide-simeth (MAALOX/MYLANTA) 200-200-20 MG/5ML suspension 15-30 mL  15-30 mL Oral Q2H PRN Schnier, Dolores Lory, MD   30 mL at 03/23/22 1225   argatroban 1 mg/mL infusion  0.5 mcg/kg/min Intravenous Continuous Schnier, Dolores Lory, MD 2.19 mL/hr at 03/25/22 0511 0.5 mcg/kg/min at 03/25/22 0511   Chlorhexidine Gluconate Cloth 2 % PADS 6 each  6 each Topical Daily Emeterio Reeve, DO   6 each at 03/24/22 0938   clopidogrel (PLAVIX) tablet 75 mg  75 mg Oral Daily Schnier, Dolores Lory, MD   75 mg at 03/24/22 0930   guaiFENesin-dextromethorphan (ROBITUSSIN DM) 100-10 MG/5ML syrup 15 mL  15 mL Oral Q4H PRN Schnier, Dolores Lory, MD       hydrALAZINE (APRESOLINE) injection 5 mg  5 mg Intravenous Q20 Min PRN Schnier, Dolores Lory, MD       HYDROmorphone (DILAUDID) injection 0.5 mg  0.5 mg Intravenous Q2H PRN Schnier, Dolores Lory, MD   0.5 mg at 03/19/22 1349   insulin aspart (novoLOG) injection 0-15 Units  0-15 Units Subcutaneous TID WC Schnier, Dolores Lory, MD   3 Units at 03/25/22 0810   iron polysaccharides (NIFEREX) capsule 150 mg  150 mg Oral Daily Schnier, Dolores Lory, MD   150 mg at 03/24/22 0930   levothyroxine (SYNTHROID) tablet 50 mcg  50 mcg Oral Q0600 Katha Cabal, MD   50 mcg at 03/25/22 0600   LORazepam (ATIVAN) tablet 0.5 mg  0.5 mg Oral Q4H PRN Schnier, Dolores Lory, MD       Or   LORazepam (ATIVAN) injection 0.5 mg  0.5 mg Intravenous Q4H PRN Schnier, Dolores Lory, MD       melatonin tablet 5 mg  5 mg Oral QHS Schnier, Dolores Lory, MD   5 mg at 03/24/22 2219   melatonin tablet 5 mg  5 mg Oral QHS PRN Schnier, Dolores Lory, MD   5 mg at 03/24/22 0024   metoprolol tartrate (LOPRESSOR) injection 2.5 mg  2.5 mg Intravenous Q6H PRN Schnier, Dolores Lory, MD   2.5 mg at 03/13/22 1715    metoprolol tartrate (LOPRESSOR) tablet 12.5 mg  12.5 mg Oral BID Katha Cabal, MD   12.5 mg at 03/24/22 2104   ondansetron (ZOFRAN) injection 4 mg  4 mg Intravenous Q6H PRN Schnier, Dolores Lory, MD       oxyCODONE-acetaminophen (PERCOCET) 7.5-325 MG per tablet 1-2 tablet  1-2 tablet Oral Q4H PRN Schnier, Dolores Lory, MD   2 tablet at 03/18/22 1541   pantoprazole (PROTONIX) EC tablet 40 mg  40 mg Oral Daily Schnier, Dolores Lory, MD   40 mg at 03/24/22 0930   phenol (CHLORASEPTIC) mouth spray 1 spray  1 spray Mouth/Throat PRN Schnier, Dolores Lory, MD   1 spray at 03/19/22 1159   polyethylene glycol (MIRALAX / GLYCOLAX) packet 17 g  17 g Oral BID Katha Cabal, MD   17 g at 03/20/22 2029   pravastatin (PRAVACHOL) tablet  40 mg  40 mg Oral Daily Schnier, Dolores Lory, MD   40 mg at 03/24/22 0930   QUEtiapine (SEROQUEL) tablet 25 mg  25 mg Oral QHS Schnier, Dolores Lory, MD   25 mg at 03/24/22 2219   sodium chloride flush (NS) 0.9 % injection 3 mL  3 mL Intravenous Q12H Schnier, Dolores Lory, MD   3 mL at 03/24/22 2106   sodium chloride flush (NS) 0.9 % injection 3 mL  3 mL Intravenous PRN Schnier, Dolores Lory, MD       sorbitol 70 % solution 30 mL  30 mL Oral Daily PRN Schnier, Dolores Lory, MD       traZODone (DESYREL) tablet 50 mg  50 mg Oral QHS PRN Emeterio Reeve, DO   50 mg at 03/24/22 2104     Discharge Medications: Please see discharge summary for a list of discharge medications.  Relevant Imaging Results:  Relevant Lab Results:   Additional Information FAO:[id number]  Al[place]teen Sam, LCSW

## 2022-03-25 NOTE — Consult Note (Signed)
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    Estimated Creatinine Clearance: 31.6 mL/min (A) (by C-G formula based on SCr of 2.02 mg/dL (H)).   Medical History: Past Medical History:  Diagnosis Date   Arthritis    Basal cell carcinoma    L clavicle, L prox forearm- removed years ago    Bladder cancer (HCC)    Bladder neck contracture    CAD (coronary artery disease)    a. 12/2020 NSTEMI/Cath: LM nl, LAD 95ost/p, LCX 50p, RCA nl. EF 45-50%.   Cataract    CKD (chronic kidney disease), stage III [facility])    ED (erectile dysfunction)    Frequency    GERD (gastroesophageal reflux disease)    History of pulmonary embolus (PE) 11/2018   a. Following LE DVT-->Chronic warfarin.   Hyperlipidemia LDL goal <70    Hypertension    Hypothyroidism    Incontinence of urine    sui, s/p cryoablation   Ischemic cardiomyopathy    a. 11/2018 Echo: EF 60-65%; b. 12/2020 LV Gram: EF 45-50% in setting of NSTEMI.   Kidney stones    Neuropathy    feet   Nocturia    Prostate cancer (HCC)    S/P   CRYOABLATION   Right elbow tendinitis    Right Lower Extremity DVT (deep venous thrombosis) (HCC) 11/25/2018   Vertigo    1-2x/yr   Wears glasses     Medications:  History of warfarin anticoagulation (appears warfarin regimen was 4 mg daily except for 2 mg on MoTh at one point = TWD 24  mg) Apixaban 5 mg BID prior to admission which is on hold Plavix 75 mg daily continued inpatient  Assessment: 76 y.o. 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 Patient recently discontinued apixaban about 1 week prior to placement of an artificial urinary sphincter procedure on on 03/09/22 and was instructed to continue to hold anticoagulation 48 hours post-op. Presented to the ED on 03/11/22 with lower limb ischemia. 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 Post-operatively, patient became hypoxic and was found to have acute submassive PE on 6/29. He underwent pulmonary thrombectomy on 6/30 and IVC filter on 7/1. Hypoxia persisted despite thrombectomy and anticoagulation and patient was re-scanned on 7/5 which showed increased clot burden. He underwent pulmonary thrombolysis and thrombectomy on 7/5.  Patient was on IV heparin throughout admission except for procedural interruptions. Given worsening clot burden despite anticoagulation with IV heparin, patient was transitioned to argatroban.  Goal of Therapy:  aPTT  50 - 90 seconds INR 4 - 6 Monitor platelets by anticoagulation protocol: Yes   Plan:   Argatroban --aPTT this AM was 63s (therapeutic x 3) --Maintain argatroban at 0.5 mcg/kg/min --Re-check aPTT and CBC tomorrow AM --Administer argatroban and warfarin together for at least 5 days and until INR > 4 for 24 hours --Once INR > 4, stop argatroban and re-check INR 4-6 hours later. If INR is therapeutic, continue with warfarin monotherapy. If INR is subtherapeutic, re-start argatroban  Warfarin --Give warfarin 5 mg x 1 tonight --Daily INR per protocol  Benita Gutter 03/25/2022,3:37 PM

## 2022-03-25 NOTE — Consult Note (Signed)
Hematology/Oncology Consult note [phone] [phone]      Patient Care Team: Rusty Aus, MD as PCP - General (Internal Medicine) Yolonda Kida, MD as PCP - Cardiology (Cardiology)   Name of the patient: Kedarius Aloisi  [id number]  1945-10-31   Date of visit: 03/25/22 REASON FOR COSULTATION:  Anticoagulation patient with significant thrombosis despite previously on anticoagulation. History of presenting illness-  76 y.o. male with PMH listed at below who is currently admitted in ICU due to recurrent venous thrombosis, left lower extremity ischemia/arterial occlusive disease status post BKA, PE and DVT.  Patient has significant history of previous DVT and PE.   Patient was seen by me in March 2020 and declined further follow-up. 2013 history of prostate cancer treated with cryo therapy  01/03/2016 history of superficial venous thrombosis demonstrated in the greater saphenous vein-.  Also history of superficial thrombophlebitis of segment  of left the basal leg vein in the upper arm-08/24/2017.  No anticoagulation was recommended at that time due to the SVT features. 11/25/2018, right lower extremity acute DVT, started on Lovenox for about a week, 12/05/2018, patient underwent TURBT and intravesical instillation of gemcitabine for noninvasive low-grade papillary urothelial carcinoma.  Lovenox was held for 2 days prior and for 24 hours after procedure, and also started on Eliquis 12/15/2018, developed acute nonocclusive segmental and subsegmental pulmonary emboli within the right upper lobe, right middle lobe, right lower lobe pulmonary branches.  Patient was admitted to the hospital and started on heparin drip.  Patient was seen by me during his admission at that time.  Lovenox 1 mg/kg twice daily was recommended at discharge.  He declined further follow-up with me in the clinic.  His anticoagulation was managed by primary care provider Dr. Sabra Heck.  Patient was  on Coumadin for anticoagulation until Feb 2023.  Patient did not check INR since September 2022.  Patient also developed supratherapeutic INR due to interaction between Coumadin and Diflucan for treatment of esophageal candidiasis.  Patient was off Coumadin on 11/17/2021.  11/21/2021 developed acute nonocclusive DVT, started on Lovenox decision was made to switch to Eliquis.  03/09/2022, artificial urinary sphincter placement.  His Eliquis was held for 1 week prior to the procedure.  He has noticed left lower extremity pain 1 to 2 days after holding Eliquis. 03/11/2022, ER visit due to progressively worsening left lower extremity pain. CT angio aortobifemoral with and without contrast showed eccentric thrombus in the left outflow vessels with distal occlusion of left renal vessels.  Delayed filling of the right popliteal artery is concerning for underlying occlusions and thromboembolic disease in the right runoff vessels Started on heparin drip in ED. 03/11/2022, vascular surgeon lower extremity bypass surgery 03/12/2022, severe left lower extremity pain, return to the OR for left lower extremity thrombectomy.  Continued on heparin drip. 03/16/2022, left lower extremity extremity nonsalvageable.  Status post BKA.  Per my discussion with pharmacy, heparin was held around 9 AM on 03/16/2022 and resumed on 03/17/2022 9:30 PM. . 03/17/2022, acute respiratory failure, CT chest angiogram showed bilateral central pulm emboli involving the main pulmonary arteries and extending into all lobar branches with nearly occlusive clot to the right lower lobe, left upper lobe, several segmental branches.  CT evidence of right heart strain.Mildly increased sclerotic focus   03/18/2022, thrombolysis, mechanical thrombectomy.  Heparin gtt restarted after procedure. 03/19/2022, heparin gtt was continued. 03/19/2022, bilateral lower extremity ultrasound showed thrombosis of right femoral popliteal and calf veins.  Partially thrombosed left  profunda femoris vein. 03/19/2022 IVC filter placement. 03/20/2022, still have significant hypoxia.  On heparin gtt. 03/21/2022 -03/22/2022, on  heparin gtt. 03/23/2022, CT angio chest.showed increased clot burden within right pulmonary artery and the right lower lobe pulmonary emboli with similar clot burden in the right middle lobe, right upper lobe, left lower lobe.  Single residual fibrin strand/clot at the bifurcation of the pulmonary artery with decreased clot burden in the left upper lobe artery branches.  Persistent right heart strain.  Findings suspicious for developing pulmonary infarcts. 03/23/2022, mechanical embolectomy of pulmonary arteries as well as right external iliac vein, common iliac vein and inferior vena cava.  Given that patient developed recurrent PE/DVT despite being therapeutic on heparin GTT, post thrombectomy, patient was switched to Angiomax and later started on Argatroban drip  03/25/2022, patient's respiratory status has significantly improved.   Currently patient reports feeling well.  Shortness of breath improved.  He is breathing comfortably on room air.  Review of Systems  Constitutional:  Negative for appetite change, chills, fatigue, fever and unexpected weight change.  HENT:   Negative for hearing loss and voice change.   Eyes:  Negative for eye problems and icterus.  Respiratory:  Positive for shortness of breath. Negative for chest tightness and cough.        SOB has significantly improved  Cardiovascular:  Negative for chest pain and leg swelling.  Gastrointestinal:  Negative for abdominal distention and abdominal pain.  Endocrine: Negative for hot flashes.  Genitourinary:  Negative for difficulty urinating, dysuria and frequency.   Musculoskeletal:  Negative for arthralgias.       Status post left BKA.  Skin:  Negative for itching and rash.  Neurological:  Negative for light-headedness and numbness.  Hematological:  Negative for adenopathy.   Psychiatric/Behavioral:  Negative for confusion.     Allergies  Allergen Reactions   Doxazosin Other (See Comments)    Other reaction(s): Unknown    Patient Active Problem List   Diagnosis Date Noted   Goals of care, counseling/discussion 03/23/2022   Reactive thrombocytosis 03/21/2022   Paroxysmal atrial fibrillation with RVR ([place]) 03/20/2022   Acute bilateral deep vein thrombosis (DVT) of femoral veins (HCC) 03/20/2022   Acute respiratory failure with hypoxia (HCC) 03/16/2022   Acute on chronic diastolic CHF (congestive heart failure) ([place]) 03/16/2022   PSVT (paroxysmal supraventricular tachycardia) ([place]) 03/16/2022   AKI (acute kidney injury) ([place]) 03/15/2022   Critical limb ischemia of left lower extremity with ulceration of lower leg ([place]) 03/15/2022   Arterial occlusion 03/11/2022   Elevated troponin    Unstable angina ([place]) 01/05/2021   Hyperlipidemia LDL goal <70    CKD (chronic kidney disease), stage III (HCC)    Coronary artery disease    NSTEMI (non-ST elevated myocardial infarction) ([place]) 12/23/2020   Acquired hypothyroidism 04/20/2020   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/20/2020   Nonthrombocytopenic purpura ([place]) 12/03/2019   Hypercoagulable state ([place]) 12/10/2018   Acute deep vein thrombosis (DVT) of calf muscle vein of right lower extremity (HCC)    Bilateral pulmonary embolism ([place]) 12/07/2018   GERD (gastroesophageal reflux disease) 12/07/2018   HTN (hypertension) 12/07/2018   HLD (hyperlipidemia) 12/07/2018   Prostate cancer ([place]) 12/07/2018   Bladder cancer ([place]) 12/07/2018   DM type 2 with diabetic mixed hyperlipidemia ([place]) 04/04/2018   Pain in right hand 10/04/2017   Tubular adenoma 25/85/2778   Helicobacter pylori (H. pylori) infection 09/04/2017   Medicare annual wellness visit, initial 03/31/2017   History of prostate cancer 10/27/2014  Lower urinary tract symptoms (LUTS) 03/18/2012   Nephrolithiasis 03/18/2012     Past Medical  History:  Diagnosis Date   Arthritis    Basal cell carcinoma    L clavicle, L prox forearm- removed years ago    Bladder cancer [facility])    Bladder neck contracture    CAD (coronary artery disease)    a. 12/2020 NSTEMI/Cath: LM nl, LAD 95ost/p, LCX 50p, RCA nl. EF 45-50%.   Cataract    CKD (chronic kidney disease), stage III [facility]    ED (erectile dysfunction)    Frequency    GERD (gastroesophageal reflux disease)    History of pulmonary embolus (PE) 11/2018   a. Following LE DVT-->Chronic warfarin.   Hyperlipidemia LDL goal <70    Hypertension    Hypothyroidism    Incontinence of urine    sui, s/p cryoablation   Ischemic cardiomyopathy    a. 11/2018 Echo: EF 60-65%; b. 12/2020 LV Gram: EF 45-50% in setting of NSTEMI.   Kidney stones    Neuropathy    feet   Nocturia    Prostate cancer ([place])    S/P   CRYOABLATION   Right elbow tendinitis    Right Lower Extremity DVT (deep venous thrombosis) ([place]) 11/25/2018   Vertigo    1-2x/yr   Wears glasses      Past Surgical History:  Procedure Laterality Date   AMPUTATION Left 03/16/2022   Procedure: AMPUTATION BELOW KNEE;  Surgeon: Katha Cabal, MD;  Location: [facility];  Service: Vascular;  Laterality: Left;   ARTHROPLASTY AND TENDON REPAIR LEFT THUMB  JAN 2014   CATARACT EXTRACTION W/PHACO Left 11/16/2020   Procedure: CATARACT EXTRACTION PHACO AND INTRAOCULAR LENS PLACEMENT (IOC) LEFT  7.09 00:56.4 12.6%;  Surgeon: Leandrew Koyanagi, MD;  Location: [place];  Service: Ophthalmology;  Laterality: Left;   CATARACT EXTRACTION W/PHACO Right 12/02/2020   Procedure: CATARACT EXTRACTION PHACO AND INTRAOCULAR LENS PLACEMENT (IOC) RIGHT MALYUGIN 5.52 00:49.7 11.1%;  Surgeon: Leandrew Koyanagi, MD;  Location: [place];  Service: Ophthalmology;  Laterality: Right;   COLONOSCOPY     COLONOSCOPY WITH PROPOFOL N/A 08/09/2017   Procedure: COLONOSCOPY WITH PROPOFOL;  Surgeon: Manya Silvas, MD;  Location: [facility]  ENDOSCOPY;  Service: Endoscopy;  Laterality: N/A;   CORONARY ANGIOGRAPHY N/A 12/28/2020   Procedure: CORONARY ANGIOGRAPHY;  Surgeon: Sherren Mocha, MD;  Location: [place] CV [facility];  Service: Cardiovascular;  Laterality: N/A;   CORONARY STENT INTERVENTION N/A 12/28/2020   Procedure: CORONARY STENT INTERVENTION;  Surgeon: Sherren Mocha, MD;  Location: [place] [facility];  Service: Cardiovascular;  Laterality: N/A;   ESOPHAGOGASTRODUODENOSCOPY (EGD) WITH PROPOFOL N/A 08/09/2017   Procedure: ESOPHAGOGASTRODUODENOSCOPY (EGD) WITH PROPOFOL;  Surgeon: Manya Silvas, MD;  Location: [facility] ENDOSCOPY;  Service: Endoscopy;  Laterality: N/A;   GREEN LIGHT LASER TURP (TRANSURETHRAL RESECTION OF PROSTATE N/A 12/14/2015   Procedure: GREEN LIGHT LASER TURP (TRANSURETHRAL RESECTION OF PROSTATE) LASER OF BLADDER NECK CONTRACTURE;  Surgeon: Carolan Clines, MD;  Location: [place];  Service: Urology;  Laterality: N/A;   IVC FILTER INSERTION N/A 03/19/2022   Procedure: IVC FILTER INSERTION;  Surgeon: Serafina Mitchell, MD;  Location: [place] [facility];  Service: Cardiovascular;  Laterality: N/A;   LEFT HEART CATH AND CORONARY ANGIOGRAPHY N/A 12/24/2020   Procedure: LEFT HEART CATH AND CORONARY ANGIOGRAPHY and possible PCI and stent;  Surgeon: Yolonda Kida, MD;  Location: [place] [facility];  Service: Cardiovascular;  Laterality: N/A;   LEFT HEART  CATH AND CORONARY ANGIOGRAPHY N/A 01/06/2021   Procedure: LEFT HEART CATH AND CORONARY ANGIOGRAPHY;  Surgeon: Troy Sine, MD;  Location: [place] [facility];  Service: Cardiovascular;  Laterality: N/A;   LOWER EXTREMITY ANGIOGRAPHY Left 03/11/2022   Procedure: Lower Extremity Angiography;  Surgeon: Katha Cabal, MD;  Location: [place] [facility];  Service: Cardiovascular;  Laterality: Left;   PENILE PROSTHESIS IMPLANT N/A 12/06/2013   Procedure: PENILE PROTHESIS INFLATABLE;  Surgeon: Ailene Rud, MD;  Location: [facility];  Service: Urology;  Laterality: N/A;   PROSTATE CRYOABLATION  06-01-2012  [facility]   PULMONARY THROMBECTOMY N/A 03/18/2022   Procedure: PULMONARY THROMBECTOMY;  Surgeon: Algernon Huxley, MD;  Location: [place] [facility];  Service: Cardiovascular;  Laterality: N/A;   PULMONARY THROMBECTOMY Bilateral 03/23/2022   Procedure: PULMONARY THROMBECTOMY;  Surgeon: Katha Cabal, MD;  Location: [place] [facility];  Service: Cardiovascular;  Laterality: Bilateral;   THROMBECTOMY ILIAC ARTERY Left 03/12/2022   Procedure: THROMBECTOMY LEG;  Surgeon: Conrad Brookston, MD;  Location: [facility];  Service: Vascular;  Laterality: Left;   TRANSURETHRAL RESECTION OF BLADDER NECK N/A 03/20/2015   Procedure: RELEASE BLADDER NECK CONTRACTURE  WITH WOLF BUTTON ELECTRODE ;  Surgeon: Carolan Clines, MD;  Location: [place];  Service: Urology;  Laterality: N/A;   TRANSURETHRAL RESECTION OF BLADDER TUMOR N/A 12/05/2018   Procedure: TRANSURETHRAL RESECTION OF BLADDER TUMOR (TURBT)/CYSTOSCOPY/  INSTILLATION OF Rodell Perna;  Surgeon: Ceasar Mons, MD;  Location[facility];  Service: Urology;  Laterality: N/A;   TRANSURETHRAL RESECTION OF BLADDER TUMOR N/A 01/15/2020   Procedure: TRANSURETHRAL RESECTION OF BLADDER TUMOR (TURBT)/ CYSTOSCOPY;  Surgeon: Ceasar Mons, MD;  Location: [facility];  Service: Urology;  Laterality: N/A;   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      Social History   Socioeconomic History   Marital status: Married    Spouse name: Zimri Brennen   Number of children: Not on file   Years of education: Not on file   Highest education level: Not on file  Occupational History   Not on file  Tobacco Use   Smoking status: Never   Smokeless tobacco: Never  Vaping Use   Vaping Use: Never used  Substance and Sexual Activity   Alcohol use: No   Drug use: No   Sexual activity: Not Currently     Birth control/protection: None  Other Topics Concern   Not on file  Social History Narrative   Lives locally with wife.  Exercises regularly - gym/golf.  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   Social Determinants of Health   Financial Resource Strain: Not on file  Food Insecurity: Not on file  Transportation Needs: Not on file  Physical Activity: Not on file  Stress: Not on file  Social Connections: Not on file  Intimate Partner Violence: Not on file     Family History  Problem Relation Age of Onset   CAD Father 64     Current Facility-Administered Medications:    acetaminophen (TYLENOL) tablet 325-650 mg, 325-650 mg, Oral, Q4H PRN, 650 mg at 03/23/22 2003 **OR** acetaminophen (TYLENOL) suppository 325-650 mg, 325-650 mg, Rectal, Q4H PRN, Schnier, Dolores Lory, MD   alum & mag hydroxide-simeth (MAALOX/MYLANTA) 200-200-20 MG/5ML suspension 15-30 mL, 15-30 mL, Oral, Q2H PRN, Schnier, Dolores Lory, MD, 30 mL at 03/23/22 1225   argatroban 1 mg/mL infusion, 0.5  mcg/kg/min, Intravenous, Continuous, Schnier, Dolores Lory, MD, Last Rate: 2.19 mL/hr at 03/25/22 1000, 0.5 mcg/kg/min at 03/25/22 1000   Chlorhexidine Gluconate Cloth 2 % PADS 6 each, 6 each, Topical, Daily, Emeterio Reeve, DO, 6 each at 03/25/22 6160   clopidogrel (PLAVIX) tablet 75 mg, 75 mg, Oral, Daily, Schnier, Dolores Lory, MD, 75 mg at 03/25/22 0919   guaiFENesin-dextromethorphan (ROBITUSSIN DM) 100-10 MG/5ML syrup 15 mL, 15 mL, Oral, Q4H PRN, Schnier, Dolores Lory, MD   hydrALAZINE (APRESOLINE) injection 5 mg, 5 mg, Intravenous, Q20 Min PRN, Schnier, Dolores Lory, MD   HYDROmorphone (DILAUDID) injection 0.5 mg, 0.5 mg, Intravenous, Q2H PRN, Schnier, Dolores Lory, MD, 0.5 mg at 03/19/22 1349   insulin aspart (novoLOG) injection 0-15 Units, 0-15 Units, Subcutaneous, TID WC, Schnier, Dolores Lory, MD, 3 Units at 03/25/22 1219   iron polysaccharides (NIFEREX) capsule 150 mg, 150 mg, Oral, Daily, Schnier,  Dolores Lory, MD, 150 mg at 03/25/22 7371   levothyroxine (SYNTHROID) tablet 50 mcg, 50 mcg, Oral, Q0600, Schnier, Dolores Lory, MD, 50 mcg at 03/25/22 0600   LORazepam (ATIVAN) tablet 0.5 mg, 0.5 mg, Oral, Q4H PRN **OR** LORazepam (ATIVAN) injection 0.5 mg, 0.5 mg, Intravenous, Q4H PRN, Schnier, Dolores Lory, MD   melatonin tablet 5 mg, 5 mg, Oral, QHS, Schnier, Dolores Lory, MD, 5 mg at 03/24/22 2219   melatonin tablet 5 mg, 5 mg, Oral, QHS PRN, Schnier, Dolores Lory, MD, 5 mg at 03/24/22 0024   metoprolol tartrate (LOPRESSOR) injection 2.5 mg, 2.5 mg, Intravenous, Q6H PRN, Schnier, Dolores Lory, MD, 2.5 mg at 03/13/22 1715   metoprolol tartrate (LOPRESSOR) tablet 12.5 mg, 12.5 mg, Oral, BID, Schnier, Dolores Lory, MD, 12.5 mg at 03/25/22 0918   ondansetron (ZOFRAN) injection 4 mg, 4 mg, Intravenous, Q6H PRN, Schnier, Dolores Lory, MD   oxyCODONE-acetaminophen (PERCOCET) 7.5-325 MG per tablet 1-2 tablet, 1-2 tablet, Oral, Q4H PRN, Schnier, Dolores Lory, MD, 2 tablet at 03/18/22 1541   pantoprazole (PROTONIX) EC tablet 40 mg, 40 mg, Oral, Daily, Schnier, Dolores Lory, MD, 40 mg at 03/25/22 0918   phenol (CHLORASEPTIC) mouth spray 1 spray, 1 spray, Mouth/Throat, PRN, Schnier, Dolores Lory, MD, 1 spray at 03/19/22 1159   polyethylene glycol (MIRALAX / GLYCOLAX) packet 17 g, 17 g, Oral, BID, Schnier, Dolores Lory, MD, 17 g at 03/20/22 2029   pravastatin (PRAVACHOL) tablet 40 mg, 40 mg, Oral, Daily, Schnier, Dolores Lory, MD, 40 mg at 03/25/22 0626   QUEtiapine (SEROQUEL) tablet 25 mg, 25 mg, Oral, QHS, Schnier, Dolores Lory, MD, 25 mg at 03/24/22 2219   sodium chloride flush (NS) 0.9 % injection 3 mL, 3 mL, Intravenous, Q12H, Schnier, Dolores Lory, MD, 3 mL at 03/25/22 0919   sodium chloride flush (NS) 0.9 % injection 3 mL, 3 mL, Intravenous, PRN, Schnier, Dolores Lory, MD   sorbitol 70 % solution 30 mL, 30 mL, Oral, Daily PRN, Schnier, Dolores Lory, MD   traZODone (DESYREL) tablet 50 mg, 50 mg, Oral, QHS PRN, Emeterio Reeve, DO, 50 mg at 03/24/22  2104   Physical exam:  Vitals:   03/25/22 0300 03/25/22 0400 03/25/22 0500 03/25/22 0600  BP: (!) 118/53 110/70 127/68 93/63  Pulse: (!) 104 88 87 86  Resp: 19 20 (!) 22 20  Temp:   98 F (36.7 C)   TempSrc:   Oral   SpO2: 100% 100% 100% 99%  Weight:      Height:       Physical Exam Constitutional:      General: He is  not in acute distress.    Appearance: He is not diaphoretic.  HENT:     Head: Normocephalic and atraumatic.     Nose: Nose normal.     Mouth/Throat:     Pharynx: No oropharyngeal exudate.  Eyes:     General: No scleral icterus.    Pupils: Pupils are equal, round, and reactive to light.  Cardiovascular:     Rate and Rhythm: Regular rhythm. Tachycardia present.     Heart sounds: No murmur heard. Pulmonary:     Effort: Pulmonary effort is normal. No respiratory distress.     Breath sounds: No rales.  Chest:     Chest wall: No tenderness.  Abdominal:     General: There is no distension.     Palpations: Abdomen is soft.     Tenderness: There is no abdominal tenderness.  Musculoskeletal:        General: Normal range of motion.     Cervical back: Normal range of motion and neck supple.     Comments: Status post left BKA,   Skin:    General: Skin is warm and dry.     Findings: No erythema.  Neurological:     Mental Status: He is alert and oriented to person, place, and time.     Cranial Nerves: No cranial nerve deficit.     Motor: No abnormal muscle tone.     Coordination: Coordination normal.  Psychiatric:        Mood and Affect: Mood and affect normal.      Labs    Latest Ref Rng & Units 03/25/2022    4:43 AM 03/24/2022    3:47 AM 03/23/2022    5:01 AM  CBC  WBC 4.0 - 10.5 K/uL 23.0  22.5  20.2   Hemoglobin 13.0 - 17.0 g/dL 8.0  8.2  9.2   Hematocrit 39.0 - 52.0 % 26.0  27.0  30.4   Platelets 150 - 400 K/uL 565  643  768       Latest Ref Rng & Units 03/25/2022    4:43 AM 03/23/2022    5:01 AM 03/22/2022    4:09 AM  CMP  Glucose 70 - 99 mg/dL 151   135  147   BUN 8 - 23 mg/dL 49  30  28   Creatinine 0.61 - 1.24 mg/dL 2.02  1.57  1.39   Sodium 135 - 145 mmol/L 134  139  139   Potassium 3.5 - 5.1 mmol/L 4.4  4.3  4.3   Chloride 98 - 111 mmol/L 107  111  113   CO2 22 - 32 mmol/L '20  22  22   '$ Calcium 8.9 - 10.3 mg/dL 7.9  8.2  8.0       RADIOGRAPHIC STUDIES: I have personally reviewed the radiological images as listed and agreed with the findings in the report. PERIPHERAL VASCULAR CATHETERIZATION  Result Date: 03/23/2022 See surgical note for result.  CT Angio Chest Pulmonary Embolism (PE) W or WO Contrast  Result Date: 03/23/2022 CLINICAL DATA:  pulmonary embolism, eval for possible repeat thrombectomy EXAM: CT ANGIOGRAPHY CHEST WITH CONTRAST TECHNIQUE: Multidetector CT imaging of the chest was performed using the standard protocol during bolus administration of intravenous contrast. Multiplanar CT image reconstructions and MIPs were obtained to evaluate the vascular anatomy. RADIATION DOSE REDUCTION: This exam was performed according to the departmental dose-optimization program which includes automated exposure control, adjustment of the mA and/or kV according to patient size and/or use of  iterative reconstruction technique. CONTRAST:  38m OMNIPAQUE IOHEXOL 350 MG/ML SOLN COMPARISON:  CTA 03/17/2022, CTA 12/07/2018. FINDINGS: Cardiovascular: Satisfactory opacification of the pulmonary arteries. There is increased embolic burden within the right main and lower lobe pulmonary arteries. There are additional emboli in the right upper and middle lobar and segmental branches. Additional pulmonary emboli in the left lower lobar, segmental and subsegmental pulmonary arteries as well as segmental and subsegmental branches of the left upper lobe. There is a thin residual strand of clot at the bifurcation of the main pulmonary artery into the right and left pulmonary arteries with overall decreased clot burden in the left upper lobe arteries. There is  persistent evidence of right heart strain with RV: LV ratio measuring up to 1.6, and reflux of contrast into the IVC. Mediastinum/Nodes: No lymphadenopathy. Lungs/Pleura: There are increased peripheral ground-glass opacities in the upper lobes and superior segment of the right lower lobe. Bibasilar hypoventilatory changes. No pleural effusion. No pneumothorax. Upper Abdomen: No acute findings. Musculoskeletal: No acute osseous abnormality. There is a single 8 mm sclerotic lesion within the T4 vertebral body which is most likely a bone island, present since at least March 2020. Review of the MIP images confirms the above findings. IMPRESSION: Increased clot burden within the right pulmonary artery and right lower lobe pulmonary emboli with similar clot burden in the right middle lobe, right upper lobe, and left lower lobe. Thin residual fiber strand/clot at the bifurcation of the pulmonary artery with decreased clot burden in the left upper lobe arterial branches. Persistent right heart strain with RV:LV ratio of 1.6 and reflux of contrast into the IVC. Findings are consistent with at least submassive (intermediate risk) PE. The presence of right heart strain has been associated with an increased risk of morbidity and mortality. Please refer to the "Code PE Focused" order set in EPIC. Increased peripheral ground-glass opacities in the upper lobes and superior segment of the right lower lobe, suspicious for developing pulmonary infarcts. Critical Value/emergent results were called by telephone at the time of interpretation on 03/23/2022 at 11:59 am to provider NEmeterio Reeve, who verbally acknowledged these results. Electronically Signed   By: JMaurine SimmeringM.D.   On: 03/23/2022 12:02   PERIPHERAL VASCULAR CATHETERIZATION  Result Date: 03/21/2022    Patient name: KKEMOND AMORIN       MRN: [id number]       DOB: [birth year]         Sex: male  03/19/2022 Pre-operative Diagnosis: DVT Post-operative diagnosis:  Same  Surgeon:  WAnnamarie MajorProcedure Performed:            1.  U/s guided access, right femoral vein            2.  IVC venogram            3.  Placement of IVC filter    Indications: This is a 76year old gentleman who underwent PE thrombectomy yesterday.  He had an acute pulmonary decompensation today while on anticoagulation.  The hospital team is worried about recurrent PE in the setting of anticoagulation and would like an IVC filter placed.  Informed consent was obtained via the son and the patient.  Procedure:  The patient was identified in the holding area and taken to the specials procedure room.  A timeout was called.  Ultrasound was used to evaluate the right common femoral vein which was widely patent and easily compressible.  1% lidocaine was used for local anesthesia.  The right common femoral vein was then cannulated under ultrasound guidance with a micropuncture needle.  A 018 wire was advanced without resistance followed placement of micropuncture sheath.  Next, a 035 wire was inserted and a 5 French sheath was placed.  A pigtail catheter was positioned in the right common iliac vein and a IVC venogram was performed which identified the location of the renal veins.  The IVC was of normal diameter without any anatomic variance.  A Amplatz wire was then inserted.  The filter introducing sheath was then placed up to the level of the renal veins.  A argon filter was then loaded into the sheath and then successfully deployed landing at the level of the renal veins.  The filter was in good position without any tilt.  The sheath was then removed and manual pressure was held for hemostasis.  There were no immediate complications.    Impression:             #1  Successful placement of removable infrarenal IVC filter             #2  Normal venacavogram   V. 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 The superficial great saphenous vein was also interrogated. Spectral Doppler was utilized to evaluate flow at rest and with distal augmentation maneuvers in the common femoral, femoral and popliteal veins. COMPARISON:  11/21/2021 FINDINGS: RIGHT LOWER EXTREMITY Common Femoral Vein: No evidence of thrombus. Normal compressibility, respiratory phasicity and response to augmentation. Saphenofemoral Junction: No evidence of thrombus. Normal compressibility and flow on color Doppler imaging. Profunda Femoral Vein: No evidence of thrombus. Normal compressibility and flow on color Doppler imaging. Femoral Vein: Diffuse thrombosis. Popliteal Vein: Diffusely thrombosed. Calf Veins: Posterior tibial, peroneal, and gastrocnemius veins are thrombosed. Venous Reflux:  None. Other Findings:  None. LEFT LOWER EXTREMITY Common Femoral Vein: No evidence of thrombus. Normal compressibility, respiratory phasicity and response to augmentation. Saphenofemoral Junction: No evidence of thrombus. Normal compressibility and flow on color Doppler imaging. Profunda Femoral Vein: Partially thrombosed. Femoral Vein: No evidence of thrombus. Normal compressibility, respiratory phasicity and response to augmentation. Popliteal Vein: No evidence of thrombus. Normal compressibility, respiratory phasicity and response to augmentation. Other Findings:   None. IMPRESSION: 1. Thrombosis of the right femoral, popliteal, and calf veins. 2. Partially thrombosed left Profunda femoris vein. Electronically Signed   By: Miachel Roux M.D.   On: 03/19/2022 16:20   DG Chest Port 1 View  Result Date: 03/19/2022 CLINICAL DATA:  Shortness of breath, respiratory distress, coronary artery disease, stage III chronic kidney disease, hypertension, prostate cancer EXAM: PORTABLE CHEST 1 VIEW COMPARISON:  Portable exam 1401 hours compared to 03/16/2022 FINDINGS: Mild enlargement of cardiac silhouette. Mediastinal contours and pulmonary vascularity normal. Lungs clear. No acute infiltrate, pleural effusion, or pneumothorax. Osseous structures unremarkable. IMPRESSION: No acute abnormalities. Electronically Signed   By: Lavonia Dana M.D.   On:  03/19/2022 14:19   PERIPHERAL VASCULAR CATHETERIZATION  Result Date: 03/18/2022 See surgical note for result.  ECHOCARDIOGRAM COMPLETE  Result Date: 03/18/2022    ECHOCARDIOGRAM REPORT   Patient Name:   DAUNTE OESTREICH Date of Exam: 03/18/2022 Medical Rec #:  [id number]        Height:       69.0 in Accession #:    [id number]       Weight:       161.2 lb Date of Birth:  1946-04-09        BSA:          1.885 m Patient Age:    32 years         BP:           126/84 mmHg Patient Gender: M                HR:           90 bpm. Exam Location:  [facility] Procedure: 2D Echo, Cardiac Doppler and Color Doppler Indications:     Atrial Fibrillation I48.91  History:         Patient has prior history of Echocardiogram examinations, most                  recent 12/25/2020. CAD; Risk Factors:Hypertension.  Sonographer:     Sherrie Sport Referring Phys:  [id number] Sharen Hones Diagnosing Phys: Isaias Cowman MD  Sonographer Comments: Technically challenging study due to limited acoustic windows, no apical window and no subcostal window. IMPRESSIONS  1. Left ventricular ejection fraction, by estimation, is 60 to 65%. The left ventricle has normal function. The left  ventricle has no regional wall motion abnormalities. Left ventricular diastolic parameters are indeterminate.  2. Right ventricular systolic function is normal. The right ventricular size is normal.  3. Large thrombus noted in right atrium.  4. The mitral valve is normal in structure. Mild mitral valve regurgitation. No evidence of mitral stenosis.  5. The aortic valve is normal in structure. Aortic valve regurgitation is not visualized. No aortic stenosis is present.  6. The inferior vena cava is normal in size with greater than 50% respiratory variability, suggesting right atrial pressure of 3 mmHg. FINDINGS  Left Ventricle: RA thrombus noted. Left ventricular ejection fraction, by estimation, is 60 to 65%. The left ventricle has normal function. The left ventricle has no regional wall motion abnormalities. The left ventricular internal cavity size was normal in size. There is no left ventricular hypertrophy. Left ventricular diastolic parameters are indeterminate. Right Ventricle: The right ventricular size is normal. No increase in right ventricular wall thickness. Right ventricular systolic function is normal. Left Atrium: Left atrial size was normal in size. Right Atrium: Large thrombus noted in right atrium. Right atrial size was normal in size. Pericardium: There is no evidence of pericardial effusion. Mitral Valve: The mitral valve is normal in structure. Mild mitral valve regurgitation. No evidence of mitral valve stenosis. Tricuspid Valve: The tricuspid valve is normal in structure. Tricuspid valve regurgitation is mild . No evidence of tricuspid stenosis. Aortic Valve: The aortic valve is normal in structure. Aortic valve regurgitation is not visualized. No aortic stenosis is present. Pulmonic Valve: The pulmonic valve was normal in structure. Pulmonic valve regurgitation is not visualized. No evidence of pulmonic stenosis. Aorta: The aortic root is normal in size and structure. Venous: The inferior vena  cava is normal in size with greater than 50% respiratory variability, suggesting right atrial pressure of 3 mmHg.  IAS/Shunts: No atrial level shunt detected by color flow Doppler.  LEFT VENTRICLE PLAX 2D LVIDd:         3.95 cm LVIDs:         2.60 cm LV PW:         1.30 cm LV IVS:        1.40 cm  Isaias Cowman MD Electronically signed by Isaias Cowman MD Signature Date/Time: 03/18/2022/1:00:12 PM    Final (Updated)    CT Angio Chest Pulmonary Embolism (PE) W or WO Contrast  Addendum Date: 03/17/2022   ADDENDUM REPORT: 03/17/2022 20:06 ADDENDUM: These results were called by telephone at the time of interpretation on 03/17/2022 at 8:06 pm to provider NP BRENDA MORRRISON, who verbally acknowledged these results. Electronically Signed   By: Fidela Salisbury M.D.   On: 03/17/2022 20:06   Result Date: 03/17/2022 CLINICAL DATA:  Shortness of breath. EXAM: CT ANGIOGRAPHY CHEST WITH CONTRAST TECHNIQUE: Multidetector CT imaging of the chest was performed using the standard protocol during bolus administration of intravenous contrast. Multiplanar CT image reconstructions and MIPs were obtained to evaluate the vascular anatomy. RADIATION DOSE REDUCTION: This exam was performed according to the departmental dose-optimization program which includes automated exposure control, adjustment of the mA and/or kV according to patient size and/or use of iterative reconstruction technique. CONTRAST:  41m OMNIPAQUE IOHEXOL 350 MG/ML SOLN COMPARISON:  Feb 11, 2022 FINDINGS: Cardiovascular: Satisfactory opacification of the pulmonary arteries to the segmental level. Bilateral central pulmonary emboli involving the main pulmonary arteries and extending to wall lobar branches with nearly occlusive clot to the right lower lobe, left upper lobe and several segmental branches. RV/LV ratio of 1.6, significant for right heart strain. Mediastinum/Nodes: No enlarged mediastinal, hilar, or axillary lymph nodes. Thyroid gland,  trachea, and esophagus demonstrate no significant findings. Lungs/Pleura: Scattered areas of ground-glass opacities throughout the lung parenchyma likely representing hypoperfusion. Atelectasis versus developing pulmonary infarcts in the bilateral lower lobes dependently. 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 Musculoskeletal: Sclerotic focus within T4, minimally enlarged since July 2022. Review of the MIP images confirms the above findings. IMPRESSION: 1. Bilateral central pulmonary emboli involving the main pulmonary arteries and extending to all lobar branches with nearly occlusive clot to the right lower lobe, left upper lobe and several segmental branches. 2. Positive for acute PE with CT evidence of right heart strain (RV/LV Ratio = 1.6) consistent with at least submassive (intermediate risk) PE. The presence of right heart strain has been associated with an increased risk of morbidity and mortality. 3. Scattered areas of ground-glass opacities throughout the lung parenchyma likely representing hypoperfusion. 4. Atelectasis versus developing pulmonary infarcts in the bilateral lower lobes dependently. 5. Mildly increased sclerotic focus within T4 vertebral body. Please correlate clinically. Electronically Signed: By: DFidela SalisburyM.D. On: 03/17/2022 19:40   DG Chest 1 View  Result Date: 03/16/2022 CLINICAL DATA:  Provided history: Shortness of breath. EXAM: CHEST  1 VIEW COMPARISON:  CT angiogram chest 02/11/2022. Prior chest radiographs 02/11/2022 and earlier. FINDINGS: Heart size within normal limits. Linear opacities within the left mid-to-lower lung field. No appreciable airspace consolidation within the right lung. No evidence of pleural effusion or pneumothorax. No acute bony abnormality identified. IMPRESSION: Linear opacities within the left mid-to-lower lung field, with an appearance favoring atelectasis. No definite airspace  consolidation. Electronically Signed   By: KKellie SimmeringD.O.   On: 03/16/2022 12:53   CT ANGIO LOW EXTREM LEFT  W &/OR WO CONTRAST  Result Date: 03/12/2022 CLINICAL DATA:  Persistent ischemia of the left lower extremity. EXAM: CT ANGIOGRAPHY LOWER LEFT EXTREMITY TECHNIQUE: Multidetector CT imaging of the abdomen, pelvis and left lower extremities was performed using the standard protocol during bolus administration of intravenous contrast. Multiplanar CT image reconstructions and MIPs were obtained to evaluate the vascular anatomy. Multidetector CT imaging of the abdomen, pelvis and lower extremities was performed using the standard protocol during bolus administration of intravenous contrast. Multiplanar CT image reconstructions and MIPs were obtained to evaluate the vascular anatomy. CONTRAST:  136m OMNIPAQUE IOHEXOL 350 MG/ML SOLN COMPARISON:  CT angiography of abdominal aorta with iliofemoral runoff dated 03/11/2022. FINDINGS: VASCULAR Aorta: Normal caliber without aneurysm or dissection. Scattered atherosclerotic calcifications. No significant stenosis. IMA: Stenosis at the origin due to aortic plaque. IMA is patent. LEFT Lower Extremity Inflow: Common, internal and external iliac arteries are patent without evidence of aneurysm, dissection, vasculitis or significant stenosis. Outflow: Left common femoral artery is patent. Left profunda femoral arteries are patent. Left SFA is patent with mild atherosclerotic disease. Nonocclusive filling defect in the distal left SFA on Image 161/4. Interval placement of vascular stent within left popliteal artery at the level of the knee across the site of previous intraluminal thrombus. The stented portion of the popliteal artery appears patent. Runoff: As noted previously, the proximal and mid anterior tibial artery are patent. Signs of thromboembolic disease in the runoff vessels of the left lower extremity are again noted including: -Persistent abrupt occlusion in the  distal anterior tibial artery, image 313/4. -Persistent distal thrombus in the posterior tibial artery noted, image 308/4. -persistent distal occlusive thrombus in the peroneal artery with signs of distal reconstitution, image 320/4. Veins: No obvious venous abnormality within the limitations of this arterial phase study. Review of the MIP images confirms the above findings. Non-vascular Bowel: Scattered colonic diverticula. No acute abnormality involving the appendix. No evidence for bowel dilatation or focal bowel inflammation. Lymphatic: No lymph node enlargement in the abdomen or pelvis. Reproductive: Patient has penile implants within old fluid reservoir on right side and a new fluid reservoir on the left. Subcutaneous gas around this new left device is compatible with recent surgery. There is probably a small prostate with postoperative changes. Other: Negative for free fluid. Negative for free air. Subcutaneous gas extending into the left scrotum compatible with prior surgery. Cannot exclude a small amount of fluid in left scrotum. Musculoskeletal: No acute bone abnormality. IMPRESSION: 1. Interval placement of vascular stent within the left popliteal artery at the level of the knee across the site of previous intraluminal thrombus. The stented portion of the popliteal artery appears patent. 2. Persistent occlusion of the distal left anterior tibial, posterior tibial arteries and peroneal artery compatible with thromboembolic disease. Distal reconstitution of the peroneal artery as noted previously. 3. Since the previous exam there are no signs to suggest acute proximal occlusion to account for the progressive left foot ischemia. 4. Nonocclusive filling defect within the distal left SFA. 5. Aortic Atherosclerosis (ICD10-I70.0). Electronically Signed   By: TKerby MoorsM.D.   On: 03/12/2022 19:04   PERIPHERAL VASCULAR CATHETERIZATION  Result Date: 03/11/2022 See surgical note for result.  CT Angio  Aortobifemoral W and/or Wo Contrast  Result Date: 03/11/2022 CLINICAL DATA:  Left leg that is getting worse. Concern for ischemia. Reportedly, the patient had recent urologic surgery. EXAM: CT ANGIOGRAPHY OF ABDOMINAL AORTA WITH ILIOFEMORAL RUNOFF TECHNIQUE: Multidetector CT imaging of the abdomen, pelvis and lower extremities  was performed using the standard protocol during bolus administration of intravenous contrast. Multiplanar CT image reconstructions and MIPs were obtained to evaluate the vascular anatomy. RADIATION DOSE REDUCTION: This exam was performed according to the departmental dose-optimization program which includes automated exposure control, adjustment of the mA and/or kV according to patient size and/or use of iterative reconstruction technique. CONTRAST:  136m OMNIPAQUE IOHEXOL 350 MG/ML SOLN COMPARISON:  CT abdomen and pelvis 02/05/2022 FINDINGS: VASCULAR Aorta: Normal caliber without aneurysm or dissection. Scattered atherosclerotic calcifications. No significant stenosis. Celiac: Patent without evidence of aneurysm, dissection, vasculitis or significant stenosis. Splenic artery is heavily calcified. SMA: Patent without evidence of aneurysm or significant stenosis. There is a small linear density in the mid SMA on sequence 4 image 66 and difficult to exclude focal dissection or intimal flap in this area. No other evidence for dissection or intimal flap. Renals: Mixed plaque at the origin and proximal aspect of the right renal artery causing approximately 50% stenosis. Left renal artery is patent without significant stenosis. No evidence for aneurysm or dissection involving renal arteries. IMA: Stenosis at the origin due to aortic plaque.  IMA is patent. RIGHT Lower Extremity Inflow: Common, internal and external iliac arteries are patent without evidence of aneurysm, dissection, vasculitis or significant stenosis. Outflow: Right common femoral artery is patent. Proximal right profunda femoral  arteries are patent. Proximal right SFA is patent. Decreased opacification in the mid and distal right SFA on the initial set of images. The right popliteal artery is not opacified with contrast on the initial set of images compared to the left lower extremity but there is flow in the right popliteal artery on the delayed images suggesting slow flow in the right popliteal artery. Runoff: Limited evaluation but there may be a small amount of flow the proximal runoff vessels. LEFT Lower Extremity Inflow: Common, internal and external iliac arteries are patent without evidence of aneurysm, dissection, vasculitis or significant stenosis. Outflow: Left common femoral artery is patent. Left profunda femoral arteries are patent. Left SFA is patent with mild atherosclerotic disease. Nonocclusive filling defect in the distal left SFA on sequence 4, image 234 and there is eccentric thrombus in the left popliteal artery above the knee causing severe stenosis on sequence 4 image 284. Additional intraluminal thrombus in the popliteal artery below the knee on image 310. Configuration of this thrombus is concerning for embolic disease. Runoff: The proximal and mid anterior tibial artery is patent but there is an abrupt occlusion in the distal anterior tibial artery. Distal thrombus in the posterior tibial artery. Distal occlusion in the peroneal artery with distal reconstitution. Findings are most compatible with thromboembolic disease in the runoff vessels. Veins: No obvious venous abnormality within the limitations of this arterial phase study. Review of the MIP images confirms the above findings. NON-VASCULAR Lower chest: Calcified granuloma in the left lower lobe. No pleural effusions. Hepatobiliary: Normal appearance of the liver and gallbladder. No biliary dilatation. Pancreas: Unremarkable. No pancreatic ductal dilatation or surrounding inflammatory changes. Spleen: Normal in size without focal abnormality. Adrenals/Urinary  Tract: Normal appearance of the adrenal glands. Bilateral renal cysts without hydronephrosis. Again noted is a small exophytic hyperdense structure in the inferior left kidney that measures up to 1.1 cm on sequence 4 image 88. 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 Tiny focus of gas in the urinary bladder which may be iatrogenic. No ureter dilatation. Focal low-density near the base of  the bladder likely related to previous bladder or urethral surgery. Stomach/Bowel: Scattered colonic diverticula. No acute abnormality involving the appendix. No evidence for bowel dilatation or focal bowel inflammation. Lymphatic: No lymph node enlargement in the abdomen or pelvis. Reproductive: Patient has penile implants within old fluid reservoir on right side and a new fluid reservoir on the left. Subcutaneous gas around this new left device is compatible with recent surgery. There is probably a small prostate with postoperative changes. Other: Negative for free fluid. Negative for free air. Subcutaneous gas extending into the left scrotum compatible with prior surgery. Cannot exclude a small amount of fluid in left scrotum. Musculoskeletal: No acute bone abnormality. IMPRESSION: VASCULAR 1. Asymmetric flow to the lower extremities with delayed flow in the right lower extremity. Evidence for eccentric thrombus in the left outflow vessels with distal occlusions in left runoff vessels. Findings are concerning for thromboembolic disease based on the disease configuration. Delayed filling of the right popliteal artery is concerning for underlying occlusions and thromboembolic disease in the right runoff vessels. 2.  Aortic Atherosclerosis (ICD10-I70.0). 3. 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 4. Question small intimal flap or focal dissection in the SMA. 5. Stenosis at the origin of the IMA related to aortic plaque. NON-VASCULAR 1. New  postoperative changes in the pelvis and left scrotum associated with penile prosthesis. 2. Bilateral renal cysts. 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 These results were called by telephone at the time of interpretation on 03/11/2022 at 2:57 pm to provider [facility] , who verbally acknowledged these results. Electronically Signed   By: Markus Daft M.D.   On: 03/11/2022 15:05    Assessment and plan-   #Recurrent submassive PE/bilateral lower extremity DVT, status post mechanical thrombectomy  Despite being therapeutic heparin drip.  Currently on argatroban drip. Previously on Coumadin for several years and switched to Eliquis in early March 2023. He has been doing well with no recurrent VTE continue Eliquis being held for urology procedure. I recommend bridging argatroban to Coumadin.  Discussed with ICU pharmacist Cristie Hem. Both warfarin and argatroban elevate the INR, please follow transition protocol- overlap Argatroban and coumadin for 4-5 days, once INR reaches >4, hold argatroban and check INR 4-6 hours later. Goal of INR 2-3.  Patient needs to follow-up with primary care provider after discharge for Coumadin dosing. Discussed with patient that he will need perioperative bridging in the future for surgery/procedures. Check factor V Leiden mutation, prothrombin gene mutation, anticardiolipin antibodies, lupus anticoagulant.  Beta-2 glycoprotein antibodies, JAK2 mutation with reflex. Additional hypercoagulable work-up will be checked outpatient once acute phase has resolved. Outpatient follow-up with hematology clinic 1 to 2 weeks after discharge. Thank you for allowing me to participate in the care of this patient.  Dr. Rogue Bussing is on-call over the weekend.. Plan was discussed with pharmacist and Dr. Sheppard Coil.  Earlie Server, MD, PhD Hematology Oncology  03/25/2022

## 2022-03-25 NOTE — Progress Notes (Signed)
ANTICOAGULATION CONSULT NOTE  Pharmacy Consult for Argatroban infusion Indication: thromboembolic treatment  Patient Measurements: Height: '5\' 9"'$  (175.3 cm) Weight: 73.1 kg (161 lb 2.5 oz) IBW/kg (Calculated) : 70.7 Heparin Dosing Weight: 79.3 kg  Labs: Recent Labs    03/22/22 1352 03/22/22 2150 03/23/22 0501 03/23/22 0501 03/23/22 2325 03/24/22 0347 03/25/22 0443  HGB  --   --  9.2*   < >  --  8.2* 8.0*  HCT  --   --  30.4*  --   --  27.0* 26.0*  PLT  --   --  768*  --   --  643* 565*  APTT  --   --   --   --  71* 63* 63*  HEPARINUNFRC 0.40 0.42 0.47  --   --   --   --   CREATININE  --   --  1.57*  --   --   --   --    < > = values in this interval not displayed.     Estimated Creatinine Clearance: 40.7 mL/min (A) (by C-G formula based on SCr of 1.57 mg/dL (H)).   Medical History: Past Medical History:  Diagnosis Date   Arthritis    Basal cell carcinoma    L clavicle, L prox forearm- removed years ago    Bladder cancer (HCC)    Bladder neck contracture    CAD (coronary artery disease)    a. 12/2020 NSTEMI/Cath: LM nl, LAD 95ost/p, LCX 50p, RCA nl. EF 45-50%.   Cataract    CKD (chronic kidney disease), stage III [facility])    ED (erectile dysfunction)    Frequency    GERD (gastroesophageal reflux disease)    History of pulmonary embolus (PE) 11/2018   a. Following LE DVT-->Chronic warfarin.   Hyperlipidemia LDL goal <70    Hypertension    Hypothyroidism    Incontinence of urine    sui, s/p cryoablation   Ischemic cardiomyopathy    a. 11/2018 Echo: EF 60-65%; b. 12/2020 LV Gram: EF 45-50% in setting of NSTEMI.   Kidney stones    Neuropathy    feet   Nocturia    Prostate cancer (HCC)    S/P   CRYOABLATION   Right elbow tendinitis    Right Lower Extremity DVT (deep venous thrombosis) (HCC) 11/25/2018   Vertigo    1-2x/yr   Wears glasses     Assessment: 76 y.o. 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 Patient recently having  discontinued this about 1 week ago in preparation for placement of an artificial urinary sphincter procedure on on 03/09/22 and was instructed to continue to hold Eliquis 48 hours post-op. Presents to ED on 03/11/22 with pain and duskiness in the left foot extending towards the left calf. Pharmacy has been consulted for heparin infusion for VTE.   S/P thrombectomy 6/23, additional thromboembolectomy on 6/24 PM.  Pt was on heparin gtt since 6/24 @ ~ 2000. Pt was on Eliquis 10 mg PO BID. On 6/26 he was transitioned to aggrastat drip x 18 hrs with the hope for improving his forefoot perfusion, which was completed then transitioned to apixaban with last dose administered 0904 03/17/22 now with a new PE  7/05: Patient returned to OR with vascular team for pulmonary thrombectomy. Heparin infusion was stopped and was started on angiomax during procedure, but later transitioned to argatroban.  Goal of Therapy:  aPTT: 50-90 sec Monitor platelets by anticoagulation protocol: Yes  Plan:  7/7: aPTT @ 7158 = 63, therapeutic X 3 Will continue pt on current rate and recheck aPTT on 7/8 with AM labs.   Ahmed Inniss D  03/25/2022 5:48 AM

## 2022-03-25 NOTE — Evaluation (Addendum)
Physical Therapy Evaluation Patient Details Name: Phillip [name] MRN: [id number] DOB: 08-10-1946 Today's Date: 03/25/2022  History of Present Illness  Pt is a 76 y.o. male with medical history significant for recurrent DVTs, post operative PE on Eliquis, HTN, HLD, nephrolithiasis, malignant neoplasm of prostate post resection, OA, CAD status post PCI with stenting, CHF, who presented to [facility] ED from his PCP due to concern for critical left lower limb ischemia.  The patient had an artificial urinary sphincter placed 03/09/2022.  His Eliquis was held a week prior to the procedure.  1 to 2 days after being off Eliquis he started having pain in his left lower extremity.  His left lower extremity pain became progressively worse with his skin turning cold and blue and was associated with inability to bear weight on his left lower extremity.  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  Pt then developed bilateral PE and is s/p mechanical thrombectomy and thrombolysis 6/30 and additional mechanical thrombectomy on 7/5 with new orders placed.  Clinical Impression  Patient sleeping upon arrival to room, but easily awakens to voice (endorses recent difficulty sleeping at night).  Currently resting RA; vitals stable and WFL.  Endorses very minimal pain to bilat LEs this date; agreeable to mobility efforts as able.  R groin site clean, dry and intact (PAD removed). Generally weak and deconditioned due to prolonged hospitalization, but no focal weakness appreciated. Maintains good ROM and positioning of L LE; kerlix wrap in place to residual limb.  Currently able to complete bed mobility with min assist; unsupported sitting balance with close sup; sit/stand and standing balance with RW, mod assist.   Tolerates x10-15 seconds, then needs seated rest (moderate SOB, generalized fatigue, mild nausea).  Attempted orthostatics, but movement impacted ability to monitor (will plan to assess next session as  needed).  Completed stand x2, then unable to tolerate additional activity or OOB/chair due to generalized fatigue with activity.  Will continue to assess/progress as medically appropriate and available in subsequent sessions. Moderate SOB noted with very minimal activity levels; patient manages well with pursed-lip breathing to slow and calm breathing/RR.  Sats >95% throughout on RA despite SOB. Would benefit from skilled PT to address above deficits and promote optimal return to PLOF.; recommend transition to STR upon discharge from acute hospitalization.      Recommendations for follow up therapy are one component of a multi-disciplinary discharge planning process, led by the attending physician.  Recommendations may be updated based on patient status, additional functional criteria and insurance authorization.  Follow Up Recommendations Skilled nursing-short term rehab (<3 hours/day) Can patient physically be transported by private vehicle: No    Assistance Recommended at Discharge Frequent or constant Supervision/Assistance  Patient can return home with the following  A lot of help with walking and/or transfers;A lot of help with bathing/dressing/bathroom;Assistance with cooking/housework;Assist for transportation;Help with stairs or ramp for entrance    Equipment Recommendations Rolling walker (2 wheels);Wheelchair (measurements PT);Wheelchair cushion (measurements PT)  Recommendations for Other Services       Functional Status Assessment Patient has had a recent decline in their functional status and demonstrates the ability to make significant improvements in function in a reasonable and predictable amount of time.     Precautions / Restrictions Precautions Precautions: Fall Restrictions Weight Bearing Restrictions: No      Mobility  Bed Mobility Overal bed mobility: Needs Assistance Bed Mobility: Supine to Sit     Supine to  sit: Min assist Sit to supine: Min assist    General bed mobility comments: increased time/effort required    Transfers Overall transfer level: Needs assistance Equipment used: Rolling walker (2 wheels) Transfers: Sit to/from Stand Sit to Stand: Min assist, Mod assist           General transfer comment: sit/stand x2 with RW, min/mod assist for lift off, standing balance.  Tolerates x10-15 seconds, then needs seated rest (moderate SOB, generalized fatigue, mild nausea).  Attempted orthostatics, but movement impacted ability to monitor (will plan to assess next session as needed)    Ambulation/Gait               General Gait Details: deferred at this time  Stairs            Wheelchair Mobility    Modified Rankin (Stroke Patients Only)       Balance Overall balance assessment: Needs assistance Sitting-balance support: No upper extremity supported, Feet supported Sitting balance-Leahy Scale: Good     Standing balance support: Bilateral upper extremity supported Standing balance-Leahy Scale: Poor Standing balance comment: heavy bilat UE support required; tolerates only 15-20 seconds                             Pertinent Vitals/Pain Pain Assessment Pain Assessment: No/denies pain    Home Living Family/patient expects to be discharged to:: Private residence Living Arrangements: Spouse/significant other Available Help at Discharge: Available 24 hours/day Type of Home: Other(Comment) Home Access: Stairs to enter Entrance Stairs-Rails: Right Entrance Stairs-Number of Steps: 1   Home Layout: Two level;Able to live on main level with bedroom/bathroom Home Equipment: Rolling Walker (2 wheels);Cane - single point;Shower seat      Prior Function Prior Level of Function : Independent/Modified Independent             Mobility Comments: indep without AD ADLs Comments: indep in ADL/IADL, works part time at a golf course, retired Teacher, English as a foreign language Dominance   Dominant Hand:  Right    Extremity/Trunk Assessment   Upper Extremity Assessment Upper Extremity Assessment: Overall WFL for tasks assessed    Lower Extremity Assessment Lower Extremity Assessment: Generalized weakness (R LE grossly 4-/5 throughout; L LE grossly 4-/5 at hip/knee (BKA). Kerlix dressing intact to L residual limb, very mild drainaged noted)       Communication   Communication: No difficulties  Cognition Arousal/Alertness: Awake/alert Behavior During Therapy: WFL for tasks assessed/performed Overall Cognitive Status: Within Functional Limits for tasks assessed                                 General Comments: Pt is A &O x4 and very pleasant and cooperative; motivated to participate and progress as able        General Comments      Exercises Other Exercises Other Exercises: Introduced education on limb desensitization techniques, positioning of  L LE; patient voiced understanding of information.  Patient with questions about shaping, wrapping; message sent to vascular to clear for initiation of ace wraps.  Will continue to follow for recommendations.   Assessment/Plan    PT Assessment Patient needs continued PT services  PT Problem List Decreased strength;Decreased range of motion;Decreased activity tolerance;Decreased balance;Decreased mobility;Decreased coordination;Decreased knowledge of use of DME;Decreased safety awareness;Decreased knowledge of precautions;Decreased skin integrity;Pain       PT Treatment Interventions DME  instruction;Gait training;Stair training;Functional mobility training;Therapeutic activities;Therapeutic exercise;Balance training;Patient/family education;Neuromuscular re-education    PT Goals (Current goals can be found in the Care Plan section)  Acute Rehab PT Goals Patient Stated Goal: to get past this and continue to be active PT Goal Formulation: With patient Time For Goal Achievement: 04/08/22 Potential to Achieve Goals: Good     Frequency Min 2X/week     Co-evaluation               AM-PAC PT "6 Clicks" Mobility  Outcome Measure Help needed turning from your back to your side while in a flat bed without using bedrails?: A Little Help needed moving from lying on your back to sitting on the side of a flat bed without using bedrails?: A Little Help needed moving to and from a bed to a chair (including a wheelchair)?: A Lot Help needed standing up from a chair using your arms (e.g., wheelchair or bedside chair)?: A Lot Help needed to walk in hospital room?: A Lot Help needed climbing 3-5 steps with a railing? : Total 6 Click Score: 13    End of Session Equipment Utilized During Treatment: Gait belt Activity Tolerance: Patient tolerated treatment well Patient left: in bed;with call bell/phone within reach Nurse Communication: Mobility status PT Visit Diagnosis: Other abnormalities of gait and mobility (R26.89);Muscle weakness (generalized) (M62.81);Pain Pain - Right/Left: Left Pain - part of body: Leg    Time: 1340-1406 PT Time Calculation (min) (ACUTE ONLY): 26 min   Charges:   PT Evaluation $PT Re-evaluation: 1 Re-eval PT Treatments $Therapeutic Activity: 8-22 mins   Neno [place] H. Owens Shark, PT, DPT, NCS 03/25/22, 4:44 PM [phone]

## 2022-03-25 NOTE — TOC Progression Note (Signed)
Transition of Care [facility]) - Progression Note    Patient Details  Name: Phillip [name] MRN: [id number] Date of Birth: 1946-03-27  Transition of Care [facility]) CM/SW [place], [place] Phone Number: 03/25/2022, 8:56 AM  Clinical Narrative:     Patient's spouse Levada Dy called this CSW back, reports after speaking with family they would like to move forward with SNF.   Agreeable for CSW to send out referrals. CSW has sent referrals pending bed offers.     Expected Discharge Plan:  (TBD) Barriers to Discharge: Continued Medical Work up  Expected Discharge Plan and Services Expected Discharge Plan:  (TBD)       Living arrangements for the past 2 months: Single Family Home                                       Social Determinants of Health (SDOH) Interventions    Readmission Risk Interventions     No data to display

## 2022-03-25 NOTE — Progress Notes (Signed)
   03/25/22 0900  Clinical Encounter Type  Visited With Patient  Visit Type Follow-up   Chaplain provided follow up visit. Found patient to be quite tired at the time.

## 2022-03-26 LAB — CBC
HCT: 25.5 % — ABNORMAL LOW (ref 39.0–52.0)
Hemoglobin: 7.8 g/dL — ABNORMAL LOW (ref 13.0–17.0)
MCH: 27.9 pg (ref 26.0–34.0)
MCHC: 30.6 g/dL (ref 30.0–36.0)
MCV: 91.1 fL (ref 80.0–100.0)
Platelets: 529 10*3/uL — ABNORMAL HIGH (ref 150–400)
RBC: 2.8 MIL/uL — ABNORMAL LOW (ref 4.22–5.81)
RDW: 18.6 % — ABNORMAL HIGH (ref 11.5–15.5)
WBC: 21.8 10*3/uL — ABNORMAL HIGH (ref 4.0–10.5)
nRBC: 0.7 % — ABNORMAL HIGH (ref 0.0–0.2)

## 2022-03-26 LAB — BASIC METABOLIC PANEL
Anion gap: 6 (ref 5–15)
BUN: 42 mg/dL — ABNORMAL HIGH (ref 8–23)
CO2: 20 mmol/L — ABNORMAL LOW (ref 22–32)
Calcium: 8 mg/dL — ABNORMAL LOW (ref 8.9–10.3)
Chloride: 106 mmol/L (ref 98–111)
Creatinine, Ser: 1.73 mg/dL — ABNORMAL HIGH (ref 0.61–1.24)
GFR, Estimated: 41 mL/min — ABNORMAL LOW (ref >60)
Glucose, Bld: 142 mg/dL — ABNORMAL HIGH (ref 70–99)
Potassium: 4.8 mmol/L (ref 3.5–5.1)
Sodium: 132 mmol/L — ABNORMAL LOW (ref 135–145)

## 2022-03-26 LAB — PROTIME-INR
INR: 2 — ABNORMAL HIGH (ref 0.8–1.2)
Prothrombin Time: 22.9 seconds — ABNORMAL HIGH (ref 11.4–15.2)

## 2022-03-26 LAB — BETA-2-GLYCOPROTEIN I ABS, IGG/M/A
Beta-2 Glyco I IgG: <9 GPI IgG units (ref 0–20)
Beta-2-Glycoprotein I IgA: <9 GPI IgA units (ref 0–25)
Beta-2-Glycoprotein I IgM: <9 GPI IgM units (ref 0–32)

## 2022-03-26 LAB — PROTEIN C, TOTAL: Protein C, Total: 75 % (ref 60–150)

## 2022-03-26 LAB — GLUCOSE, CAPILLARY
Glucose-Capillary: 132 mg/dL — ABNORMAL HIGH (ref 70–99)
Glucose-Capillary: 175 mg/dL — ABNORMAL HIGH (ref 70–99)
Glucose-Capillary: 156 mg/dL — ABNORMAL HIGH (ref 70–99)
Glucose-Capillary: 189 mg/dL — ABNORMAL HIGH (ref 70–99)

## 2022-03-26 LAB — APTT: aPTT: 64 seconds — ABNORMAL HIGH (ref 24–36)

## 2022-03-26 MED ORDER — WARFARIN SODIUM 5 MG PO TABS
5.0000 mg | ORAL_TABLET | Freq: Once | ORAL | Status: DC
  Filled 2022-03-26: qty 1

## 2022-03-26 MED ORDER — APIXABAN 5 MG PO TABS: 5.0000 mg | ORAL_TABLET | Freq: Two times a day (BID) | ORAL | Status: DC

## 2022-03-26 MED ORDER — APIXABAN 5 MG PO TABS
10.0000 mg | ORAL_TABLET | Freq: Two times a day (BID) | ORAL | Status: DC
  Administered 2022-03-26 – 2022-03-28 (×4): 10 mg via ORAL
  Filled 2022-03-26 (×4): qty 2

## 2022-03-26 NOTE — Progress Notes (Signed)
RN and NT assisted patient with out of bed mobility.  Patient able to get self to edge of bed and required minimal assist with the walker to stand at edge of bed.   Patient became became very short of breath, dizzy and weak after standing for approximately 1-2 minutes and required sitting back on edge of the bed.  Patient advised RN and NT that he needed to have a BM.   Assisted patient on to [facility], patient very weak and required maximum assist to transport to [facility] and then maximum assist to transfer from [facility] to chair. Patient's oxygen saturations stable throughout mobility but work of breathing increased and increased shortness of breath during mobility.

## 2022-03-26 NOTE — Progress Notes (Signed)
Physical Therapy Treatment Patient Details Name: Phillip [name] MRN: [id number] DOB: 09-Feb-1946 Today's Date: 03/26/2022   History of Present Illness Pt is a 76 y.o. male with medical history significant for recurrent DVTs, post operative PE on Eliquis, HTN, HLD, nephrolithiasis, malignant neoplasm of prostate post resection, OA, CAD status post PCI with stenting, CHF, who presented to [facility] ED from his PCP due to concern for critical left lower limb ischemia.  The patient had an artificial urinary sphincter placed 03/09/2022.  His Eliquis was held a week prior to the procedure.  1 to 2 days after being off Eliquis he started having pain in his left lower extremity.  His left lower extremity pain became progressively worse with his skin turning cold and blue and was associated with inability to bear weight on his left lower extremity.  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  Pt then developed bilateral PE and is s/p mechanical thrombectomy and thrombolysis 6/30 and additional mechanical thrombectomy on 7/5 with new orders placed.    PT Comments    PT/OT co treat 2/2 to pt's limited activity tolerance and need for +2 for safety. Pt is A and O and agreeable to session. He endorses getting very SOB earlier in the day with RN staff getting him to/from [facility]. Pt does endorse slight pain in lower back that did not impact session progression. Pt was able to stand 3 x from recliner > RW for ~ 20 sec prior to requiring prolonged seated rest. SOB noted with minimal activity. Refer to OT note for vitals response to activity. Overall pt is progressing. Author issued HEP for amputee exercises and educated pt/pt's son on expectations going forward. Pt does endorse phantom limb pain at times but not constantly. Overall pt tolerated session well. Recommend DC to SNF to address deficits while maximizing independence with ADLs.     Recommendations for follow up therapy are one component of a  multi-disciplinary discharge planning process, led by the attending physician.  Recommendations may be updated based on patient status, additional functional criteria and insurance authorization.  Follow Up Recommendations  Skilled nursing-short term rehab (<3 hours/day) (if pt can demonstrate improve activity tolerance, may be good CIR candidate.)     Assistance Recommended at Discharge Frequent or constant Supervision/Assistance  Patient can return home with the following A lot of help with walking and/or transfers;A lot of help with bathing/dressing/bathroom;Assistance with cooking/housework;Assist for transportation;Help with stairs or ramp for entrance   Equipment Recommendations  Rolling walker (2 wheels);Wheelchair (measurements PT);Wheelchair cushion (measurements PT)    Recommendations for Other Services       Precautions / Restrictions Precautions Precautions: Fall Restrictions Weight Bearing Restrictions: Yes LLE Weight Bearing: Non weight bearing Other Position/Activity Restrictions: Recent L BKA     Mobility  Bed Mobility Overal bed mobility: Needs Assistance Bed Mobility: Sit to Supine  Sit to supine: Supervision    Transfers Overall transfer level: Needs assistance Equipment used: Rolling walker (2 wheels) Transfers: Sit to/from Stand Sit to Stand: Min assist, +2 safety/equipment Stand pivot transfers: Min assist, +2 safety/equipment    General transfer comment: pt was able to stand 3 x from recliner> RW ~ 20 sec each trial prior to stand pivot back to bed. pt becomes SOB quickly. poor pleth reading throughout. Attempted placement of pulse oximeter sensor ear and forehead with still poor pleth reliability    Ambulation/Gait    General Gait Details: Was able to pivot on  RLE with BUE support. Endurance/SOB limits pt's abilities to "hop" away from EOB/recliner not strength.    Balance Overall balance assessment: Needs assistance Sitting-balance support: No  upper extremity supported, Feet supported Sitting balance-Leahy Scale: Good     Standing balance support: Bilateral upper extremity supported Standing balance-Leahy Scale: Poor Standing balance comment: pt is high fall risk due to limited standing tolerance and not having prosthesis yet       Cognition Arousal/Alertness: Awake/alert Behavior During Therapy: WFL for tasks assessed/performed Overall Cognitive Status: Within Functional Limits for tasks assessed      General Comments: Pt is A &O x4 and very pleasant and cooperative; motivated to participate and progress as able           General Comments General comments (skin integrity, edema, etc.): Author issued HEP and discussed at length exercises and stretching to be performing on LLE. Pt's son was present. Author answers all questions. elected not to wrap stump due to pt just had dressing change. refer to OT notes for pt's vital response to activity      Pertinent Vitals/Pain Pain Assessment Pain Assessment: Faces Faces Pain Scale: Hurts a little bit Pain Location: lower back Pain Descriptors / Indicators: Discomfort Pain Intervention(s): Limited activity within patient's tolerance, Monitored during session, Premedicated before session, Repositioned     PT Goals (current goals can now be found in the care plan section) Acute Rehab PT Goals Patient Stated Goal: get back to golfing Progress towards PT goals: Progressing toward goals    Frequency    Min 2X/week      PT Plan Current plan remains appropriate    Co-evaluation   Reason for Co-Treatment: Complexity of the patient's impairments (multi-system involvement);Necessary to address cognition/behavior during functional activity;For patient/therapist safety;To address functional/ADL transfers   OT goals addressed during session: ADL's and self-care      AM-PAC PT "6 Clicks" Mobility   Outcome Measure  Help needed turning from your back to your side while  in a flat bed without using bedrails?: A Little Help needed moving from lying on your back to sitting on the side of a flat bed without using bedrails?: A Little Help needed moving to and from a bed to a chair (including a wheelchair)?: A Little Help needed standing up from a chair using your arms (e.g., wheelchair or bedside chair)?: A Lot Help needed to walk in hospital room?: A Lot Help needed climbing 3-5 steps with a railing? : Total 6 Click Score: 14    End of Session   Activity Tolerance: Patient limited by fatigue;Treatment limited secondary to medical complications (Comment) (Limited by poor activity tolerance. Minimal standing endurance due to SOB) Patient left: in bed;with call bell/phone within reach Nurse Communication: Mobility status PT Visit Diagnosis: Other abnormalities of gait and mobility (R26.89);Muscle weakness (generalized) (M62.81);Pain Pain - Right/Left: Left Pain - part of body: Leg     Time: 1425-1450 PT Time Calculation (min) (ACUTE ONLY): 25 min  Charges:  $Therapeutic Activity: 8-22 mins                     Julaine Fusi PTA 03/26/22, 4:47 PM

## 2022-03-26 NOTE — Progress Notes (Signed)
Occupational Therapy Treatment Patient Details Name: Phillip [name] MRN: [id number] DOB: [birth year] Today's Date: 03/26/2022   History of present illness Pt is a 76 y.o. 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  The patient had an artificial urinary sphincter placed 03/09/2022.  His Eliquis was held a week prior to the procedure.  1 to 2 days after being off Eliquis he started having pain in his left lower extremity.  His left lower extremity pain became progressively worse with his skin turning cold and blue and was associated with inability to bear weight on his left lower extremity.  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  Pt then developed bilateral PE and is s/p mechanical thrombectomy and thrombolysis 6/30 and additional mechanical thrombectomy on 7/5 with new orders placed.   OT comments  Pt greeted in room with son present, co tx completed with PT on this date. Tx session targeted improving activity tolerance and functional mobility for improved ADL completion. Slight improvements noted in tolerance for STS and eventual pivot to bedside chair. Pt continues with report of SOB with exertion. Good carry over of PLB to manage increased RR with activity. Spo2 >90% on finger pulse ox following activity on RA. HR below 100 throughout tx session. Pt is left in care of PT to provide HEP. Discharge recommendation remains appropriate, OT will continue to follow acutely.    Recommendations for follow up therapy are one component of a multi-disciplinary discharge planning process, led by the attending physician.  Recommendations may be updated based on patient status, additional functional criteria and insurance authorization.     Follow Up Recommendations  Skilled nursing-short term rehab (<3 hours/day)    Assistance Recommended at Discharge Frequent or constant Supervision/Assistance  Patient can return home with the following  Two people to help with walking and/or transfers;A lot of help with bathing/dressing/bathroom;Assistance with cooking/housework;Assist for transportation;Help with stairs or ramp for entrance   Equipment Recommendations  Other (comment) (per next venue of care)    Recommendations for Other Services      Precautions / Restrictions Precautions Precautions: Fall Restrictions Weight Bearing Restrictions: Yes LLE Weight Bearing: Non weight bearing Other Position/Activity Restrictions: Recent L BKA       Mobility Bed Mobility Overal bed mobility: Needs Assistance Bed Mobility: Sit to Supine     Supine to sit: Supervision Sit to supine: Supervision        Transfers Overall transfer level: Needs assistance Equipment used: Rolling walker (2 wheels) Transfers: Sit to/from Stand Sit to Stand: Min assist, Mod assist, +2 safety/equipment (3 attempts vcs for body mechanics) Stand pivot transfers: CGA-Min assist, +2 safety/equipment               Balance Overall balance assessment: Needs assistance Sitting-balance support: No upper extremity supported, Feet supported Sitting balance-Leahy Scale: Good     Standing balance support: Bilateral upper extremity supported Standing balance-Leahy Scale: Poor                             ADL either performed or assessed with clinical judgement   ADL  General ADL Comments: SET UP for grooming tasks in chair, simulated BSC transfer bedside chair>bed with CGA-MIN A +2    Extremity/Trunk Assessment              Vision       Perception     Praxis      Cognition Arousal/Alertness: Awake/alert   Overall Cognitive Status: Within Functional Limits for  tasks assessed                                          Exercises      Shoulder Instructions       General Comments BP 109/69 HR 93 seated in chair, BP 96/69 HR 95 back in bed after mobility; Pt did endorse generalized nausea/dizziness throughout; spo2 appeared above >90% on RA with finger pulse ox, ear pulse ox however difficulties with reading. RN notified. Pt RR up to 31, quickly down to 11-15 with PLB initiated by pt following each mobility attempt.    Pertinent Vitals/ Pain       Pain Assessment Pain Assessment: Faces Faces Pain Scale: Hurts a little bit Pain Location: lower back Pain Descriptors / Indicators: Discomfort Pain Intervention(s): Limited activity within patient's tolerance, Monitored during session, Repositioned  Home Living                                          Prior Functioning/Environment              Frequency  Min 3X/week        Progress Toward Goals  OT Goals(current goals can now be found in the care plan section)  Progress towards OT goals: Progressing toward goals     Plan Discharge plan needs to be updated;Frequency needs to be updated    Co-evaluation    PT/OT/SLP Co-Evaluation/Treatment: Yes Reason for Co-Treatment: Complexity of the patient's impairments (multi-system involvement);For patient/therapist safety   OT goals addressed during session: ADL's and self-care      AM-PAC OT "6 Clicks" Daily Activity     Outcome Measure   Help from another person eating meals?: None Help from another person taking care of personal grooming?: A Little Help from another person toileting, which includes using toliet, bedpan, or urinal?: A Lot Help from another person bathing (including washing, rinsing, drying)?: A Lot Help from another person to put on and taking off regular upper body clothing?: A Little Help from another person to put on and taking off regular lower body clothing?: A Lot 6 Click  Score: 16    End of Session Equipment Utilized During Treatment: Rolling walker (2 wheels)  OT Visit Diagnosis: Unsteadiness on feet (R26.81);Repeated falls (R29.6);Muscle weakness (generalized) (M62.81)   Activity Tolerance Patient tolerated treatment well   Patient Left in bed;with family/visitor present;with call bell/phone within reach   Nurse Communication Mobility status        Time: 1425-1450 OT Time Calculation (min): 25 min  Charges: OT General Charges $OT Visit: 1 Visit OT Treatments $Therapeutic Activity: 8-22 mins  Shanon Payor, OTD OTR/L  03/26/22, 3:27 PM

## 2022-03-26 NOTE — Consult Note (Signed)
[place] for Argatroban transition to Apixaban Indication:  Acute DVT / PE  in setting of history of VTE on chronic anticoagulation  Patient Measurements: Height: '5\' 9"'$  (175.3 cm) Weight: 69.3 kg (152 lb 12.5 oz) IBW/kg (Calculated) : 70.7  Labs: Recent Labs    03/24/22 0347 03/25/22 0443 03/26/22 0433  HGB 8.2* 8.0* 7.8*  HCT 27.0* 26.0* 25.5*  PLT 643* 565* 529*  APTT 63* 63* 64*  LABPROT  --   --  22.9*  INR  --   --  2.0*  CREATININE  --  2.02* 1.73*     Estimated Creatinine Clearance: 36.2 mL/min (A) (by C-G formula based on SCr of 1.73 mg/dL (H)).   Medical History: Past Medical History:  Diagnosis Date   Arthritis    Basal cell carcinoma    L clavicle, L prox forearm- removed years ago    Bladder cancer (HCC)    Bladder neck contracture    CAD (coronary artery disease)    a. 12/2020 NSTEMI/Cath: LM nl, LAD 95ost/p, LCX 50p, RCA nl. EF 45-50%.   Cataract    CKD (chronic kidney disease), stage III [facility])    ED (erectile dysfunction)    Frequency    GERD (gastroesophageal reflux disease)    History of pulmonary embolus (PE) 11/2018   a. Following LE DVT-->Chronic warfarin.   Hyperlipidemia LDL goal <70    Hypertension    Hypothyroidism    Incontinence of urine    sui, s/p cryoablation   Ischemic cardiomyopathy    a. 11/2018 Echo: EF 60-65%; b. 12/2020 LV Gram: EF 45-50% in setting of NSTEMI.   Kidney stones    Neuropathy    feet   Nocturia    Prostate cancer (HCC)    S/P   CRYOABLATION   Right elbow tendinitis    Right Lower Extremity DVT (deep venous thrombosis) (HCC) 11/25/2018   Vertigo    1-2x/yr   Wears glasses     Medications:  History of warfarin anticoagulation (appears warfarin regimen was 4 mg daily except for 2 mg on MoTh at one point = TWD 24 mg) Apixaban 5 mg BID prior to admission which is on hold Plavix 75 mg daily continued inpatient  Assessment: 76 y.o. male with past medical history of PE  and recurrent DVTs on Eliquis. Patient recently discontinued apixaban about 1 week prior to placement of an artificial urinary sphincter procedure on on 03/09/22 and was instructed to continue to hold anticoagulation 48 hours post-op. Presented to the ED on 03/11/22 with lower limb ischemia. 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 Post-operatively, patient became hypoxic and was found to have acute submassive PE on 6/29. He underwent pulmonary thrombectomy on 6/30 and IVC filter on 7/1. Hypoxia persisted despite thrombectomy and anticoagulation and patient was re-scanned on 7/5 which showed increased clot burden. He underwent pulmonary thrombolysis and thrombectomy on 7/5.  Patient was on IV heparin throughout admission except for procedural interruptions. Given worsening clot burden despite anticoagulation with IV heparin, patient was transitioned to argatroban.  Goal of Therapy:  aPTT 50 - 90 seconds INR 4 - 6 Monitor platelets by anticoagulation protocol: Yes   Plan:   Argatroban - discontinue 7/8 at 22:00   Apixaban to begin 7/8 at 22:00, '10mg'$  BID x 7 days followed by '5mg'$  bid.  Paulina Fusi, PharmD, BCPS 03/26/2022 4:46 PM

## 2022-03-26 NOTE — Consult Note (Signed)
[place] for Argatroban bridge to Warfarin Indication:  Acute DVT / PE  in setting of history of VTE on chronic anticoagulation  Patient Measurements: Height: '5\' 9"'$  (175.3 cm) Weight: 73.1 kg (161 lb 2.5 oz) IBW/kg (Calculated) : 70.7  Labs: Recent Labs    03/24/22 0347 03/25/22 0443 03/26/22 0433  HGB 8.2* 8.0* 7.8*  HCT 27.0* 26.0* 25.5*  PLT 643* 565* 529*  APTT 63* 63* 64*  LABPROT  --   --  22.9*  INR  --   --  2.0*  CREATININE  --  2.02* 1.73*     Estimated Creatinine Clearance: 36.9 mL/min (A) (by C-G formula based on SCr of 1.73 mg/dL (H)).   Medical History: Past Medical History:  Diagnosis Date   Arthritis    Basal cell carcinoma    L clavicle, L prox forearm- removed years ago    Bladder cancer (HCC)    Bladder neck contracture    CAD (coronary artery disease)    a. 12/2020 NSTEMI/Cath: LM nl, LAD 95ost/p, LCX 50p, RCA nl. EF 45-50%.   Cataract    CKD (chronic kidney disease), stage III [facility])    ED (erectile dysfunction)    Frequency    GERD (gastroesophageal reflux disease)    History of pulmonary embolus (PE) 11/2018   a. Following LE DVT-->Chronic warfarin.   Hyperlipidemia LDL goal <70    Hypertension    Hypothyroidism    Incontinence of urine    sui, s/p cryoablation   Ischemic cardiomyopathy    a. 11/2018 Echo: EF 60-65%; b. 12/2020 LV Gram: EF 45-50% in setting of NSTEMI.   Kidney stones    Neuropathy    feet   Nocturia    Prostate cancer (HCC)    S/P   CRYOABLATION   Right elbow tendinitis    Right Lower Extremity DVT (deep venous thrombosis) (HCC) 11/25/2018   Vertigo    1-2x/yr   Wears glasses     Medications:  History of warfarin anticoagulation (appears warfarin regimen was 4 mg daily except for 2 mg on MoTh at one point = TWD 24 mg) Apixaban 5 mg BID prior to admission which is on hold Plavix 75 mg daily continued inpatient  Assessment: 76 y.o. male with past medical history of PE and  recurrent DVTs on Eliquis. Patient recently discontinued apixaban about 1 week prior to placement of an artificial urinary sphincter procedure on on 03/09/22 and was instructed to continue to hold anticoagulation 48 hours post-op. Presented to the ED on 03/11/22 with lower limb ischemia. 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 Post-operatively, patient became hypoxic and was found to have acute submassive PE on 6/29. He underwent pulmonary thrombectomy on 6/30 and IVC filter on 7/1. Hypoxia persisted despite thrombectomy and anticoagulation and patient was re-scanned on 7/5 which showed increased clot burden. He underwent pulmonary thrombolysis and thrombectomy on 7/5.  Patient was on IV heparin throughout admission except for procedural interruptions. Given worsening clot burden despite anticoagulation with IV heparin, patient was transitioned to argatroban.  Goal of Therapy:  aPTT 50 - 90 seconds INR 4 - 6 Monitor platelets by anticoagulation protocol: Yes   Plan:   Argatroban   --aPTT this AM was 64s (therapeutic x 4) --Maintain argatroban at 0.5 mcg/kg/min --Re-check aPTT and CBC tomorrow AM --Administer argatroban and warfarin together for at least 5 days and until INR > 4 for 24 hours --Once  INR > 4, stop argatroban and re-check INR 4-6 hours later. If INR is therapeutic, continue with warfarin monotherapy. If INR is subtherapeutic, re-start argatroban  Warfarin   7/8: INR @ 0433 = 2.0 , subtherapeutic --Give warfarin 5 mg x 1 tonight --Daily INR per protocol  Phillip [name] D 03/26/2022,6:02 AM

## 2022-03-26 NOTE — TOC Progression Note (Addendum)
Transition of Care [facility]) - Progression Note    Patient Details  Name: Phillip [name] MRN: [id number] Date of Birth: 1945-09-24  Transition of Care [facility]) CM/SW [place], [place] Phone Number: 03/26/2022, 9:13 AM  Clinical Narrative:     Update: family reports they would like to hear back from [facility] prior to moving forward with bed offers they do have.   Bed offers provided to patient's spouse Levada Dy who reports she will review and get back to this CSW with choice decision today.   Expected Discharge Plan:  (TBD) Barriers to Discharge: Continued Medical Work up  Expected Discharge Plan and Services Expected Discharge Plan:  (TBD)       Living arrangements for the past 2 months: Single Family Home                                       Social Determinants of Health (SDOH) Interventions    Readmission Risk Interventions     No data to display

## 2022-03-26 NOTE — Progress Notes (Signed)
Clinically stable, after his multiple events, leading to below-knee amputation on the left, and PE, recurrent.  Currently on Argatroban with bridge to Coumadin.  Dr. Delana Meyer had discussed with the patient last night the fact that he likely was not truly an Eliquis failure, but was taken off for his urologic sphincter procedure.  He had proposed Eliquis plus Plavix.  We will touch base with pharmacy regarding this option, may need to reevaluate with hematology input as well.  No limitations on mobility from a surgical standpoint.

## 2022-03-26 NOTE — Progress Notes (Signed)
PROGRESS NOTE    Phillip [name]  YCX:[id number] DOB: Apr 15, 1946  DOA: 03/11/2022 Date of Service: 03/26/22 PCP: Rusty Aus, MD     Brief Narrative / Hospital Course:  Phillip [name] is a 76 y.o. male with medical history significant for recurrent DVTs, post operative PE on Eliquis (see detailed hematology note 03/25/2022), HTN, HLD, malignant neoplasm of prostate post resection, osteoarthritis, CAD status post PCI with stenting on Plavix, chronic diastolic CHF, who presents to [facility] ED 03/11/22 from his PCP's office, Dr. Sabra Heck, due to concern for critical left lower limb ischemia.  The patient had an artificial urinary sphincter placed on Wednesday, 03/09/2022.  His Eliquis was held a week prior to the procedure.  1 to 2 days after being off Eliquis he started having pain in his left lower extremity.  His left lower extremity pain became progressively worse, 12/10 in the last 24 hours Patient was seen by vascular surgery, had a lower extremity bypass surgery on 6/23.  Went back to the OR on 6/24 for L LE thrombectomy.  Patient also placed on heparin drip. L leg unsalvageable, BKA scheduled. 6/28.  Developed episode of PSVT, hypoxia.  Appears to be flash pulm edema, given IV Lasix. BKA performed on 6/28. 6/30.  Patient developed hypoxia and tachycardia again with atrial fibrillation.  CT angiogram was performed 6/29 showed bilateral PE.  Thrombectomy performed on 6/30, heparin drip restarted. 7/1.  Patient had another episode of hypoxemia, requiring high flow oxygen.  Duplex ultrasound showed bilateral DVT, IVC filter placed. 7/5: repeat CTA, increased clot burden on R and evidence of R heart strain, L appears improved. Planning for thrombectomy this afternoon. Cardiology has s/o  7/6: BP soft and tachypnea noted, reg HR this AM but increased later in the day, afebrile, SpO2 100% RA. Subjective improvement, no CP/SOB. Remains on argatroban infusion per vascular recs.   7/7: Consult placed  to hematology to assist with guidance on anticoagulation going forward: recommend bridge argatroban to coumadin which was initiated w/ pharmacy consult (Dr Tasia Catchings d/w pharmacist Cristie Hem). Pending hypercoag w/u. Outpatient f/u hematology in 1-2 weeks after discharge  7/8: Chat with vascular and hematology, plan at this point is to place on Eliquis.  Discussion today with patient and his wife and son regarding options for Coumadin versus Eliquis, he states he would rather do Eliquis (I did mention that there is a small but nonzero chance there may be a problem with Eliquis if he has antiphospholipid antibody syndrome but hematology believes this risk is low and find Eliquis to be an acceptable option)    Consultants:  Vascular Surgery Cardiology   Procedures: 03/16/22: L BKA 03/18/22: thrombectomy bilateral PE 03/19/22: IVC filter placed  03/23/22: thrombectomy bilateral PE    Subjective: Patient seen early morning, was having some abdominal cramping "gas pain" which resolved by the time I saw him again in the afternoon.  States breathing is good, does not feel short of breath or any chest pain.     ASSESSMENT & PLAN:   Principal Problem:   Arterial occlusion Active Problems:   Bilateral pulmonary embolism (HCC)   HTN (hypertension)   History of prostate cancer   Coronary artery disease   AKI (acute kidney injury) ([place])   Critical limb ischemia of left lower extremity with ulceration of lower leg (HCC)   Acute respiratory failure with hypoxia (HCC)   Acute on chronic diastolic CHF (congestive heart failure) (HCC)   PSVT (paroxysmal supraventricular tachycardia) (HCC)   Paroxysmal  atrial fibrillation with RVR (HCC)   Acute bilateral deep vein thrombosis (DVT) of femoral veins (HCC)   Reactive thrombocytosis   Goals of care, counseling/discussion   No problem-specific Assessment & Plan notes found for this encounter.  Acute hypoxemic respiratory failure Bilateral pulmonary  emboli. Bilateral lower extremity DVT. Flash pulmonary edema secondary to acute on chronic diastolic congestive heart failure. Paroxysmal A fib Patient is status post pulmonary thrombectomy.  IVC filter placed 7/1 due to bilateral lower extremity DVT. Patient appears to have recurrent PE even on therapeutic heparin drip.  As result, IVC filter was placed, continuin on heparin gtt per vascular  Patient still had  a large burden of PE, underwent repeated pulmonary thrombectomy 07/05 and improved as of 07/06 Oxygenation is stable. OT/PT recommending SNF, placement pending Hematology consulted 07/07-07/08, ok to start Eliquis   SIRS criteria w/ tachypnea (likely clot burden in pulmonary vasculature) and elevated WBC (likely reactive but slowly trending up/down over course of admission) 07/01 CXR no infiltrate 07/05-07/06 WBC up, likely inflammatory/reactive to increased clot burden, appreciate hematology input 07/08 WBC trending down   Goals of care:  03/23/22 patient voiced to nurse that he wanted to be made full code again, I came to chat with him before putting in the official orders.  Will go ahead and designate him as full code, he does note that "I would not want to be kept alive on machines indefinitely" and verbalizes that if he were to suffer cardiac/respiratory arrest and need to be placed on life support, but unable to come off of that life support, he would want these measures to be withdrawn and allow for natural death   Reactive thrombocytosis. Acquired hemophilia. argatroban drip  --> ELiquis Hypercoag studies pending See above re: hematology recs   Left lower extremity ischemia.  Status post BKA Failed effort with revascularization and thrombectomy. SNF pending for rehab   Insomnia. Patient slept better w/ Seroquel but still insomniac Added trazodone     History of bladder cancer s/p resection. History of prostate cancer. Urinary stress incontinence.  Status post  artificial urinary sphincter on 6/21. No new issues   Acute kidney injury. Relatively stable, continue to follow Cr up a bit 07/07 possible effect of recent contrast, will follow w/ am labs again tomorrow   Coronary artery disease status post PCI and stenting. Continue Plavix, statin.   Essential hypertension. Continue metoprolol.   DVT prophylaxis: argatroban gtt bridging to coumadin  Code Status: FULL code Family Communication: wife and son present on rounds (wife in AM, son in PM)  Disposition Plan / TOC needs: remains in SDU pending vascular clearance to deescalate but still bridging anticoag as above, will need SNF placement  Barriers to discharge / significant pending items: bridging to coumadin, will d/w pharmacy but will likely be here through the weekens             Objective: Vitals:   03/26/22 0630 03/26/22 0635 03/26/22 0700 03/26/22 0800  BP:  119/62 104/65 120/64  Pulse:  81 86 79  Resp:  (!) 21 (!) 22 19  Temp:      TempSrc:      SpO2:  99% 99% 100%  Weight: 69.3 kg     Height:        Intake/Output Summary (Last 24 hours) at 03/26/2022 0907 Last data filed at 03/26/2022 0700 Gross per 24 hour  Intake 1278.51 ml  Output 1200 ml  Net 78.51 ml   Net IO Since Admission: -  1,943.99 mL [03/26/22 0907]  Filed Weights   03/16/22 1424 03/18/22 0500 03/26/22 0630  Weight: 79.3 kg 73.1 kg 69.3 kg    Examination:  Constitutional:  VS as above General Appearance: alert, well-developed, well-nourished, NAD Eyes: Normal lids and conjunctive, non-icteric sclera Ears, Nose, Mouth, Throat: Normal appearance Neck: No masses, trachea midline Respiratory: Normal respiratory effort Breath sounds normal, no wheeze/rhonchi/rales Cardiovascular: S1/S2 normal, no murmur/rub/gallop auscultated No lower extremity edema on RLE Gastrointestinal: Nontender, no masses No hernia appreciated Musculoskeletal:  No clubbing/cyanosis of digits Neurological: No  cranial nerve deficit on limited exam Motor and sensation intact and symmetric Psychiatric: Normal judgment/insight Anxious mood and affect       Scheduled Medications:   Chlorhexidine Gluconate Cloth  6 each Topical Daily   clopidogrel  75 mg Oral Daily   insulin aspart  0-15 Units Subcutaneous TID WC   iron polysaccharides  150 mg Oral Daily   levothyroxine  50 mcg Oral Q0600   melatonin  5 mg Oral QHS   metoprolol tartrate  12.5 mg Oral BID   pantoprazole  40 mg Oral Daily   polyethylene glycol  17 g Oral BID   pravastatin  40 mg Oral Daily   QUEtiapine  25 mg Oral QHS   sodium chloride flush  3 mL Intravenous Q12H   warfarin  5 mg Oral ONCE-1600   Warfarin - Pharmacist Dosing Inpatient   Does not apply q1600    Continuous Infusions:  argatroban 0.5 mcg/kg/min (03/26/22 0700)    PRN Medications:  acetaminophen **OR** acetaminophen, alum & mag hydroxide-simeth, guaiFENesin-dextromethorphan, hydrALAZINE, HYDROmorphone (DILAUDID) injection, LORazepam **OR** LORazepam, melatonin, metoprolol tartrate, ondansetron (ZOFRAN) IV, oxyCODONE-acetaminophen, phenol, sodium chloride flush, sorbitol, traZODone  Antimicrobials:  Anti-infectives (From admission, onward)    Start     Dose/Rate Route Frequency Ordered Stop   03/23/22 1619  ceFAZolin (ANCEF) IVPB 1 g/50 mL premix  Status:  Discontinued        over 30 Minutes  Continuous PRN 03/23/22 1619 03/23/22 1852   03/23/22 1445  ceFAZolin (ANCEF) IVPB 2g/100 mL premix        2 g 200 mL/hr over 30 Minutes Intravenous On call to O.R. 03/23/22 1434 03/23/22 1924   03/18/22 0943  ceFAZolin (ANCEF) IVPB 2g/100 mL premix        2 g 200 mL/hr over 30 Minutes Intravenous 30 min pre-op 03/18/22 0943 03/18/22 1349   03/17/22 2200  ceFAZolin (ANCEF) IVPB 1 g/50 mL premix       Note to Pharmacy: Send with pt to OR   1 g 100 mL/hr over 30 Minutes Intravenous Every 8 hours 03/17/22 1718 03/18/22 0547   03/16/22 1508  ceFAZolin (ANCEF) 2-4  GM/100ML-% IVPB       Note to Pharmacy: Trudie Reed S: cabinet override      03/16/22 1508 03/16/22 1554   03/16/22 0600  ceFAZolin (ANCEF) IVPB 2g/100 mL premix        2 g 200 mL/hr over 30 Minutes Intravenous On call to O.R. 03/15/22 1315 03/16/22 1544   03/13/22 0600  ceFAZolin (ANCEF) IVPB 2g/100 mL premix       Note to Pharmacy: Send with pt to OR   2 g 200 mL/hr over 30 Minutes Intravenous On call 03/12/22 1948 03/12/22 2017   03/13/22 0400  ceFAZolin (ANCEF) IVPB 2g/100 mL premix        2 g 200 mL/hr over 30 Minutes Intravenous Every 8 hours 03/13/22 0048 03/13/22 1257   03/12/22  0600  ceFAZolin (ANCEF) IVPB 2g/100 mL premix        2 g 200 mL/hr over 30 Minutes Intravenous On call to O.R. 03/11/22 1525 03/11/22 1705   03/11/22 1547  ceFAZolin (ANCEF) 2-4 GM/100ML-% IVPB       Note to Pharmacy: Theadora Rama : cabinet override      03/11/22 1547 03/11/22 1635       Data Reviewed: I have personally reviewed following labs and imaging studies  CBC: Recent Labs  Lab 03/22/22 0409 03/23/22 0501 03/24/22 0347 03/25/22 0443 03/26/22 0433  WBC 19.9* 20.2* 22.5* 23.0* 21.8*  HGB 9.9* 9.2* 8.2* 8.0* 7.8*  HCT 32.7* 30.4* 27.0* 26.0* 25.5*  MCV 89.3 89.9 90.6 89.3 91.1  PLT 791* 768* 643* 565* 529*    Basic Metabolic Panel: Recent Labs  Lab 03/20/22 0805 03/22/22 0409 03/23/22 0501 03/25/22 0443 03/26/22 0433  NA 137 139 139 134* 132*  K 4.3 4.3 4.3 4.4 4.8  CL 105 113* 111 107 106  CO2 '24 22 22 '$ 20* 20*  GLUCOSE 127* 147* 135* 151* 142*  BUN 28* 28* 30* 49* 42*  CREATININE 1.35* 1.39* 1.57* 2.02* 1.73*  CALCIUM 8.3* 8.0* 8.2* 7.9* 8.0*  MG 2.4 2.2 2.2  --   --     GFR: Estimated Creatinine Clearance: 36.2 mL/min (A) (by C-G formula based on SCr of 1.73 mg/dL (H)). Liver Function Tests: No results for input(s): "AST", "ALT", "ALKPHOS", "BILITOT", "PROT", "ALBUMIN" in the last 168 hours. No results for input(s): "LIPASE", "AMYLASE" in the last 168  hours. No results for input(s): "AMMONIA" in the last 168 hours. Coagulation Profile: Recent Labs  Lab 03/26/22 0433  INR 2.0*   Cardiac Enzymes: No results for input(s): "CKTOTAL", "CKMB", "CKMBINDEX", "TROPONINI" in the last 168 hours. BNP (last 3 results) No results for input(s): "PROBNP" in the last 8760 hours. HbA1C: No results for input(s): "HGBA1C" in the last 72 hours. CBG: Recent Labs  Lab 03/25/22 0754 03/25/22 1119 03/25/22 1544 03/25/22 2120 03/26/22 0736  GLUCAP 160* 174* 159* 200* 132*    Lipid Profile: No results for input(s): "CHOL", "HDL", "LDLCALC", "TRIG", "CHOLHDL", "LDLDIRECT" in the last 72 hours. Thyroid Function Tests: No results for input(s): "TSH", "T4TOTAL", "FREET4", "T3FREE", "THYROIDAB" in the last 72 hours. Anemia Panel: Recent Labs    03/25/22 1521  RETICCTPCT 6.4*    Urine analysis:    Component Value Date/Time   COLORURINE YELLOW (A) 02/13/2022 0744   APPEARANCEUR HAZY (A) 02/13/2022 0744   APPEARANCEUR Clear 10/25/2014 2206   LABSPEC 1.018 02/13/2022 0744   LABSPEC 1.016 10/25/2014 2206   PHURINE 5.0 02/13/2022 0744   GLUCOSEU NEGATIVE 02/13/2022 0744   GLUCOSEU >=500 10/25/2014 2206   HGBUR NEGATIVE 02/13/2022 0744   BILIRUBINUR NEGATIVE 02/13/2022 0744   BILIRUBINUR Negative 10/25/2014 2206   KETONESUR NEGATIVE 02/13/2022 0744   PROTEINUR NEGATIVE 02/13/2022 0744   NITRITE NEGATIVE 02/13/2022 0744   LEUKOCYTESUR NEGATIVE 02/13/2022 0744   LEUKOCYTESUR Negative 10/25/2014 2206   Sepsis Labs: '@LABRCNTIP'$ (procalcitonin:4,lacticidven:4)  No results found for this or any previous visit (from the past 240 hour(s)).       Radiology Studies last 96 hours: PERIPHERAL VASCULAR CATHETERIZATION  Result Date: 03/23/2022 See surgical note for result.  CT Angio Chest Pulmonary Embolism (PE) W or WO Contrast  Result Date: 03/23/2022 CLINICAL DATA:  pulmonary embolism, eval for possible repeat thrombectomy EXAM: CT ANGIOGRAPHY  CHEST WITH CONTRAST TECHNIQUE: Multidetector CT imaging of the chest was performed using the standard protocol  during bolus administration of intravenous contrast. Multiplanar CT image reconstructions and MIPs were obtained to evaluate the vascular anatomy. RADIATION DOSE REDUCTION: This exam was performed according to the departmental dose-optimization program which includes automated exposure control, adjustment of the mA and/or kV according to patient size and/or use of iterative reconstruction technique. CONTRAST:  60m OMNIPAQUE IOHEXOL 350 MG/ML SOLN COMPARISON:  CTA 03/17/2022, CTA 12/07/2018. FINDINGS: Cardiovascular: Satisfactory opacification of the pulmonary arteries. There is increased embolic burden within the right main and lower lobe pulmonary arteries. There are additional emboli in the right upper and middle lobar and segmental branches. Additional pulmonary emboli in the left lower lobar, segmental and subsegmental pulmonary arteries as well as segmental and subsegmental branches of the left upper lobe. There is a thin residual strand of clot at the bifurcation of the main pulmonary artery into the right and left pulmonary arteries with overall decreased clot burden in the left upper lobe arteries. There is persistent evidence of right heart strain with RV: LV ratio measuring up to 1.6, and reflux of contrast into the IVC. Mediastinum/Nodes: No lymphadenopathy. Lungs/Pleura: There are increased peripheral ground-glass opacities in the upper lobes and superior segment of the right lower lobe. Bibasilar hypoventilatory changes. No pleural effusion. No pneumothorax. Upper Abdomen: No acute findings. Musculoskeletal: No acute osseous abnormality. There is a single 8 mm sclerotic lesion within the T4 vertebral body which is most likely a bone island, present since at least March 2020. Review of the MIP images confirms the above findings. IMPRESSION: Increased clot burden within the right pulmonary  artery and right lower lobe pulmonary emboli with similar clot burden in the right middle lobe, right upper lobe, and left lower lobe. Thin residual fiber strand/clot at the bifurcation of the pulmonary artery with decreased clot burden in the left upper lobe arterial branches. Persistent right heart strain with RV:LV ratio of 1.6 and reflux of contrast into the IVC. Findings are consistent with at least submassive (intermediate risk) PE. The presence of right heart strain has been associated with an increased risk of morbidity and mortality. Please refer to the "Code PE Focused" order set in EPIC. Increased peripheral ground-glass opacities in the upper lobes and superior segment of the right lower lobe, suspicious for developing pulmonary infarcts. Critical Value/emergent results were called by telephone at the time of interpretation on 03/23/2022 at 11:59 am to provider NEmeterio Reeve, who verbally acknowledged these results. Electronically Signed   By: JMaurine SimmeringM.D.   On: 03/23/2022 12:02            LOS: 15 days       NEmeterio Reeve DO [facility] 03/26/2022, 9:07 AM   Staff may message me via secure chat in [employer] but this may not receive immediate response,  please page for urgent matters!  If 7PM-7AM, please contact night-coverage [email or web address]  Dictation software was used to generate the above note. Typos may occur and escape review, as with typed/written notes. Please contact Dr ASheppard Coildirectly for clarity if needed.

## 2022-03-27 LAB — BASIC METABOLIC PANEL
Anion gap: 7 (ref 5–15)
BUN: 47 mg/dL — ABNORMAL HIGH (ref 8–23)
CO2: 18 mmol/L — ABNORMAL LOW (ref 22–32)
Calcium: 7.9 mg/dL — ABNORMAL LOW (ref 8.9–10.3)
Chloride: 107 mmol/L (ref 98–111)
Creatinine, Ser: 1.89 mg/dL — ABNORMAL HIGH (ref 0.61–1.24)
GFR, Estimated: 37 mL/min — ABNORMAL LOW (ref >60)
Glucose, Bld: 146 mg/dL — ABNORMAL HIGH (ref 70–99)
Potassium: 4.6 mmol/L (ref 3.5–5.1)
Sodium: 132 mmol/L — ABNORMAL LOW (ref 135–145)

## 2022-03-27 LAB — GLUCOSE, CAPILLARY
Glucose-Capillary: 146 mg/dL — ABNORMAL HIGH (ref 70–99)
Glucose-Capillary: 175 mg/dL — ABNORMAL HIGH (ref 70–99)
Glucose-Capillary: 133 mg/dL — ABNORMAL HIGH (ref 70–99)

## 2022-03-27 LAB — CBC
HCT: 24.6 % — ABNORMAL LOW (ref 39.0–52.0)
Hemoglobin: 7.5 g/dL — ABNORMAL LOW (ref 13.0–17.0)
MCH: 27.9 pg (ref 26.0–34.0)
MCHC: 30.5 g/dL (ref 30.0–36.0)
MCV: 91.4 fL (ref 80.0–100.0)
Platelets: 531 10*3/uL — ABNORMAL HIGH (ref 150–400)
RBC: 2.69 MIL/uL — ABNORMAL LOW (ref 4.22–5.81)
RDW: 18.9 % — ABNORMAL HIGH (ref 11.5–15.5)
WBC: 21.3 10*3/uL — ABNORMAL HIGH (ref 4.0–10.5)
nRBC: 0.8 % — ABNORMAL HIGH (ref 0.0–0.2)

## 2022-03-27 LAB — PROTEIN S, TOTAL: Protein S Ag, Total: 144 % (ref 60–150)

## 2022-03-27 LAB — DRVVT MIX: dRVVT Mix: 85 s — ABNORMAL HIGH (ref 0.0–40.4)

## 2022-03-27 LAB — CARDIOLIPIN ANTIBODIES, IGG, IGM, IGA
Anticardiolipin IgA: <9 APL U/mL (ref 0–11)
Anticardiolipin IgG: <9 GPL U/mL (ref 0–14)
Anticardiolipin IgM: <9 MPL U/mL (ref 0–12)

## 2022-03-27 LAB — HEXAGONAL PHASE PHOSPHOLIPID: Hexagonal Phase Phospholipid: 1 s (ref 0–11)

## 2022-03-27 LAB — DRVVT CONFIRM: dRVVT Confirm: 0.9 ratio (ref 0.8–1.2)

## 2022-03-27 LAB — APTT: aPTT: 70 seconds — ABNORMAL HIGH (ref 24–36)

## 2022-03-27 LAB — LUPUS ANTICOAGULANT PANEL
DRVVT: 100 s — ABNORMAL HIGH (ref 0.0–47.0)
PTT Lupus Anticoagulant: 70.6 s — ABNORMAL HIGH (ref 0.0–43.5)

## 2022-03-27 LAB — PROTEIN S ACTIVITY: Protein S Activity: >280 % — ABNORMAL HIGH (ref 63–140)

## 2022-03-27 LAB — PROTIME-INR
INR: 2.8 — ABNORMAL HIGH (ref 0.8–1.2)
Prothrombin Time: 29.3 seconds — ABNORMAL HIGH (ref 11.4–15.2)

## 2022-03-27 LAB — PTT-LA MIX: PTT-LA Mix: 68.5 s — ABNORMAL HIGH (ref 0.0–40.5)

## 2022-03-27 LAB — PROTEIN C ACTIVITY: Protein C Activity: 101 % (ref 73–180)

## 2022-03-27 NOTE — Progress Notes (Signed)
PROGRESS NOTE    Phillip [name]  SAY:[id number] DOB: 1946/08/21  DOA: 03/11/2022 Date of Service: 03/27/22 PCP: Rusty Aus, MD     Brief Narrative / Hospital Course:  Phillip [name] is a 76 y.o. male with medical history significant for recurrent DVTs, post operative PE on Eliquis (see detailed hematology note 03/25/2022), HTN, HLD, malignant neoplasm of prostate post resection, osteoarthritis, CAD status post PCI with stenting on Plavix, chronic diastolic CHF, who presents to [facility] ED 03/11/22 from his PCP's office, Dr. Sabra Heck, due to concern for critical left lower limb ischemia.  The patient had an artificial urinary sphincter placed on Wednesday, 03/09/2022.  His Eliquis was held a week prior to the procedure.  1 to 2 days after being off Eliquis he started having pain in his left lower extremity.  His left lower extremity pain became progressively worse, 12/10 in the last 24 hours Patient was seen by vascular surgery, had a lower extremity bypass surgery on 6/23.  Went back to the OR on 6/24 for L LE thrombectomy.  Patient also placed on heparin drip. L leg unsalvageable, BKA scheduled. 6/28.  Developed episode of PSVT, hypoxia.  Appears to be flash pulm edema, given IV Lasix. BKA performed on 6/28. 6/30.  Patient developed hypoxia and tachycardia again with atrial fibrillation.  CT angiogram was performed 6/29 showed bilateral PE.  Thrombectomy performed on 6/30, heparin drip restarted. 7/1.  Patient had another episode of hypoxemia, requiring high flow oxygen.  Duplex ultrasound showed bilateral DVT, IVC filter placed. 7/5: repeat CTA, increased clot burden on R and evidence of R heart strain, L appears improved. Planning for thrombectomy this afternoon. Cardiology has s/o  7/6: BP soft and tachypnea noted, reg HR this AM but increased later in the day, afebrile, SpO2 100% RA. Subjective improvement, no CP/SOB. Remains on argatroban infusion per vascular recs.   7/7: Consult placed  to hematology to assist with guidance on anticoagulation going forward: recommend bridge argatroban to coumadin which was initiated w/ pharmacy consult (Dr Tasia Catchings d/w pharmacist Cristie Hem). Pending hypercoag w/u. Outpatient f/u hematology in 1-2 weeks after discharge  7/8: Chat with vascular and hematology, plan at this point is to place on Eliquis.  Discussion today with patient and his wife and son regarding options for Coumadin versus Eliquis, he states he would rather do Eliquis (I did mention that there is a small but nonzero chance there may be a problem with Eliquis if he has antiphospholipid antibody syndrome but hematology believes this risk is low and find Eliquis to be an acceptable option)  7/9: pff drip, moved out of SDU to med-surg w/ tele. Plan continue eliquis and pending placement to SNF hopefully tomorrow    Consultants:  Vascular Surgery Cardiology   Procedures: 03/16/22: L BKA 03/18/22: thrombectomy bilateral PE 03/19/22: IVC filter placed  03/23/22: thrombectomy bilateral PE    Subjective: Patient has no complaints today other than frustrated that he is not seem to be doing very well on his incentive spirometry, takes in a lot of effort.  But he is otherwise not short of breath, no issues at rest but has not much ambulated (of course he is SP L BKA)     ASSESSMENT & PLAN:   Principal Problem:   Arterial occlusion Active Problems:   Bilateral pulmonary embolism (HCC)   HTN (hypertension)   History of prostate cancer   Coronary artery disease   AKI (acute kidney injury) ([place])   Critical limb ischemia of left  lower extremity with ulceration of lower leg (HCC)   Acute respiratory failure with hypoxia (HCC)   Acute on chronic diastolic CHF (congestive heart failure) (HCC)   PSVT (paroxysmal supraventricular tachycardia) (HCC)   Paroxysmal atrial fibrillation with RVR (HCC)   Acute bilateral deep vein thrombosis (DVT) of femoral veins (HCC)   Reactive thrombocytosis    Goals of care, counseling/discussion   No problem-specific Assessment & Plan notes found for this encounter.  Acute hypoxemic respiratory failure Bilateral pulmonary emboli. Bilateral lower extremity DVT. Flash pulmonary edema secondary to acute on chronic diastolic congestive heart failure. Paroxysmal A fib Patient is status post pulmonary thrombectomy.  IVC filter placed 7/1 due to bilateral lower extremity DVT. Patient appears to have recurrent PE even on therapeutic heparin drip.  As result, IVC filter was placed, continuin on heparin gtt per vascular  Patient still had  a large burden of PE, underwent repeated pulmonary thrombectomy 07/05 and improved as of 07/06 Oxygenation is stable. OT/PT recommending SNF, placement pending Hematology consulted 07/07-07/08, ok to start Eliquis, discontinued argatroban drip  SIRS criteria w/ tachypnea (likely clot burden in pulmonary vasculature) and elevated WBC (likely reactive but slowly trending up/down over course of admission) 07/01 CXR no infiltrate 07/05-07/06 WBC up, likely inflammatory/reactive to increased clot burden, appreciate hematology input 07/08 WBC trending down   Goals of care:  03/23/22 patient voiced to nurse that he wanted to be made full code again, I came to chat with him before putting in the official orders.  Will go ahead and designate him as full code, he does note that "I would not want to be kept alive on machines indefinitely" and verbalizes that if he were to suffer cardiac/respiratory arrest and need to be placed on life support, but unable to come off of that life support, he would want these measures to be withdrawn and allow for natural death   Reactive thrombocytosis. Acquired hemophilia. argatroban drip  --> ELiquis Hypercoag studies pending See above re: hematology recs   Left lower extremity ischemia.  Status post BKA Failed effort with revascularization and thrombectomy. SNF pending for rehab    Insomnia. Patient slept better w/ Seroquel but still insomniac Added trazodone     History of bladder cancer s/p resection. History of prostate cancer. Urinary stress incontinence.  Status post artificial urinary sphincter on 6/21. No new issues   Acute kidney injury. Relatively stable, continue to follow Cr up a bit 07/07 possible effect of recent contrast, will follow w/ am labs again tomorrow   Coronary artery disease status post PCI and stenting. Continue Plavix, statin.   Essential hypertension. Continue metoprolol.   DVT prophylaxis: Eliquis Code Status: FULL code Family Communication: wife and daughter present on rounds  Disposition Plan / TOC needs: to med-rug telemetry today, hopefully SNF tomorrow/soon  Barriers to discharge / significant pending items: placement pending              Objective: Vitals:   03/27/22 0600 03/27/22 0700 03/27/22 0800 03/27/22 1000  BP: 132/62  105/60 105/64  Pulse: 79 84 79 79  Resp: '20 13 13 14  '$ Temp:    98.9 F (37.2 C)  TempSrc:    Oral  SpO2: 98% 95% 94% 94%  Weight:      Height:        Intake/Output Summary (Last 24 hours) at 03/27/2022 1057 Last data filed at 03/27/2022 0400 Gross per 24 hour  Intake 373.89 ml  Output 1010 ml  Net -636.11 ml  Net IO Since Admission: -2,333.53 mL [03/27/22 1057]  Filed Weights   03/16/22 1424 03/18/22 0500 03/26/22 0630  Weight: 79.3 kg 73.1 kg 69.3 kg    Examination:  Constitutional:  VS as above General Appearance: alert, well-developed, well-nourished, NAD Eyes: Normal lids and conjunctive, non-icteric sclera Ears, Nose, Mouth, Throat: Normal appearance Neck: No masses, trachea midline Respiratory: Normal respiratory effort Breath sounds normal, no wheeze/rhonchi/rales Cardiovascular: S1/S2 normal, no murmur/rub/gallop auscultated No lower extremity edema on RLE Gastrointestinal: Nontender, no masses No hernia appreciated Musculoskeletal:  No  clubbing/cyanosis of digits Neurological: No cranial nerve deficit on limited exam Motor and sensation intact and symmetric Psychiatric: Normal judgment/insight Anxious mood and affect       Scheduled Medications:   apixaban  10 mg Oral BID   Followed by   Derrill Memo ON 04/02/2022] apixaban  5 mg Oral BID   Chlorhexidine Gluconate Cloth  6 each Topical Daily   clopidogrel  75 mg Oral Daily   insulin aspart  0-15 Units Subcutaneous TID WC   iron polysaccharides  150 mg Oral Daily   levothyroxine  50 mcg Oral Q0600   melatonin  5 mg Oral QHS   metoprolol tartrate  12.5 mg Oral BID   pantoprazole  40 mg Oral Daily   polyethylene glycol  17 g Oral BID   pravastatin  40 mg Oral Daily   QUEtiapine  25 mg Oral QHS   sodium chloride flush  3 mL Intravenous Q12H    Continuous Infusions:    PRN Medications:  acetaminophen **OR** acetaminophen, alum & mag hydroxide-simeth, guaiFENesin-dextromethorphan, hydrALAZINE, HYDROmorphone (DILAUDID) injection, LORazepam **OR** LORazepam, melatonin, metoprolol tartrate, ondansetron (ZOFRAN) IV, oxyCODONE-acetaminophen, phenol, sodium chloride flush, sorbitol, traZODone  Antimicrobials:  Anti-infectives (From admission, onward)    Start     Dose/Rate Route Frequency Ordered Stop   03/23/22 1619  ceFAZolin (ANCEF) IVPB 1 g/50 mL premix  Status:  Discontinued        over 30 Minutes  Continuous PRN 03/23/22 1619 03/23/22 1852   03/23/22 1445  ceFAZolin (ANCEF) IVPB 2g/100 mL premix        2 g 200 mL/hr over 30 Minutes Intravenous On call to O.R. 03/23/22 1434 03/23/22 1924   03/18/22 0943  ceFAZolin (ANCEF) IVPB 2g/100 mL premix        2 g 200 mL/hr over 30 Minutes Intravenous 30 min pre-op 03/18/22 0943 03/18/22 1349   03/17/22 2200  ceFAZolin (ANCEF) IVPB 1 g/50 mL premix       Note to Pharmacy: Send with pt to OR   1 g 100 mL/hr over 30 Minutes Intravenous Every 8 hours 03/17/22 1718 03/18/22 0547   03/16/22 1508  ceFAZolin (ANCEF) 2-4  GM/100ML-% IVPB       Note to Pharmacy: Trudie Reed S: cabinet override      03/16/22 1508 03/16/22 1554   03/16/22 0600  ceFAZolin (ANCEF) IVPB 2g/100 mL premix        2 g 200 mL/hr over 30 Minutes Intravenous On call to O.R. 03/15/22 1315 03/16/22 1544   03/13/22 0600  ceFAZolin (ANCEF) IVPB 2g/100 mL premix       Note to Pharmacy: Send with pt to OR   2 g 200 mL/hr over 30 Minutes Intravenous On call 03/12/22 1948 03/12/22 2017   03/13/22 0400  ceFAZolin (ANCEF) IVPB 2g/100 mL premix        2 g 200 mL/hr over 30 Minutes Intravenous Every 8 hours 03/13/22 0048 03/13/22 1257   03/12/22  0600  ceFAZolin (ANCEF) IVPB 2g/100 mL premix        2 g 200 mL/hr over 30 Minutes Intravenous On call to O.R. 03/11/22 1525 03/11/22 1705   03/11/22 1547  ceFAZolin (ANCEF) 2-4 GM/100ML-% IVPB       Note to Pharmacy: Theadora Rama : cabinet override      03/11/22 1547 03/11/22 1635       Data Reviewed: I have personally reviewed following labs and imaging studies  CBC: Recent Labs  Lab 03/23/22 0501 03/24/22 0347 03/25/22 0443 03/26/22 0433 03/27/22 0518  WBC 20.2* 22.5* 23.0* 21.8* 21.3*  HGB 9.2* 8.2* 8.0* 7.8* 7.5*  HCT 30.4* 27.0* 26.0* 25.5* 24.6*  MCV 89.9 90.6 89.3 91.1 91.4  PLT 768* 643* 565* 529* 531*    Basic Metabolic Panel: Recent Labs  Lab 03/22/22 0409 03/23/22 0501 03/25/22 0443 03/26/22 0433 03/27/22 0518  NA 139 139 134* 132* 132*  K 4.3 4.3 4.4 4.8 4.6  CL 113* 111 107 106 107  CO2 22 22 20* 20* 18*  GLUCOSE 147* 135* 151* 142* 146*  BUN 28* 30* 49* 42* 47*  CREATININE 1.39* 1.57* 2.02* 1.73* 1.89*  CALCIUM 8.0* 8.2* 7.9* 8.0* 7.9*  MG 2.2 2.2  --   --   --     GFR: Estimated Creatinine Clearance: 33.1 mL/min (A) (by C-G formula based on SCr of 1.89 mg/dL (H)). Liver Function Tests: No results for input(s): "AST", "ALT", "ALKPHOS", "BILITOT", "PROT", "ALBUMIN" in the last 168 hours. No results for input(s): "LIPASE", "AMYLASE" in the last 168  hours. No results for input(s): "AMMONIA" in the last 168 hours. Coagulation Profile: Recent Labs  Lab 03/26/22 0433 03/27/22 0518  INR 2.0* 2.8*    Cardiac Enzymes: No results for input(s): "CKTOTAL", "CKMB", "CKMBINDEX", "TROPONINI" in the last 168 hours. BNP (last 3 results) No results for input(s): "PROBNP" in the last 8760 hours. HbA1C: No results for input(s): "HGBA1C" in the last 72 hours. CBG: Recent Labs  Lab 03/25/22 2120 03/26/22 0736 03/26/22 1141 03/26/22 1609 03/26/22 2151  GLUCAP 200* 132* 175* 156* 189*    Lipid Profile: No results for input(s): "CHOL", "HDL", "LDLCALC", "TRIG", "CHOLHDL", "LDLDIRECT" in the last 72 hours. Thyroid Function Tests: No results for input(s): "TSH", "T4TOTAL", "FREET4", "T3FREE", "THYROIDAB" in the last 72 hours. Anemia Panel: Recent Labs    03/25/22 1521  RETICCTPCT 6.4*    Urine analysis:    Component Value Date/Time   COLORURINE YELLOW (A) 02/13/2022 0744   APPEARANCEUR HAZY (A) 02/13/2022 0744   APPEARANCEUR Clear 10/25/2014 2206   LABSPEC 1.018 02/13/2022 0744   LABSPEC 1.016 10/25/2014 2206   PHURINE 5.0 02/13/2022 0744   GLUCOSEU NEGATIVE 02/13/2022 0744   GLUCOSEU >=500 10/25/2014 2206   HGBUR NEGATIVE 02/13/2022 0744   BILIRUBINUR NEGATIVE 02/13/2022 0744   BILIRUBINUR Negative 10/25/2014 2206   KETONESUR NEGATIVE 02/13/2022 0744   PROTEINUR NEGATIVE 02/13/2022 0744   NITRITE NEGATIVE 02/13/2022 0744   LEUKOCYTESUR NEGATIVE 02/13/2022 0744   LEUKOCYTESUR Negative 10/25/2014 2206   Sepsis Labs: '@LABRCNTIP'$ (procalcitonin:4,lacticidven:4)  No results found for this or any previous visit (from the past 240 hour(s)).       Radiology Studies last 96 hours: PERIPHERAL VASCULAR CATHETERIZATION  Result Date: 03/23/2022 See surgical note for result.  CT Angio Chest Pulmonary Embolism (PE) W or WO Contrast  Result Date: 03/23/2022 CLINICAL DATA:  pulmonary embolism, eval for possible repeat thrombectomy  EXAM: CT ANGIOGRAPHY CHEST WITH CONTRAST TECHNIQUE: Multidetector CT imaging of the chest  was performed using the standard protocol during bolus administration of intravenous contrast. Multiplanar CT image reconstructions and MIPs were obtained to evaluate the vascular anatomy. RADIATION DOSE REDUCTION: This exam was performed according to the departmental dose-optimization program which includes automated exposure control, adjustment of the mA and/or kV according to patient size and/or use of iterative reconstruction technique. CONTRAST:  74m OMNIPAQUE IOHEXOL 350 MG/ML SOLN COMPARISON:  CTA 03/17/2022, CTA 12/07/2018. FINDINGS: Cardiovascular: Satisfactory opacification of the pulmonary arteries. There is increased embolic burden within the right main and lower lobe pulmonary arteries. There are additional emboli in the right upper and middle lobar and segmental branches. Additional pulmonary emboli in the left lower lobar, segmental and subsegmental pulmonary arteries as well as segmental and subsegmental branches of the left upper lobe. There is a thin residual strand of clot at the bifurcation of the main pulmonary artery into the right and left pulmonary arteries with overall decreased clot burden in the left upper lobe arteries. There is persistent evidence of right heart strain with RV: LV ratio measuring up to 1.6, and reflux of contrast into the IVC. Mediastinum/Nodes: No lymphadenopathy. Lungs/Pleura: There are increased peripheral ground-glass opacities in the upper lobes and superior segment of the right lower lobe. Bibasilar hypoventilatory changes. No pleural effusion. No pneumothorax. Upper Abdomen: No acute findings. Musculoskeletal: No acute osseous abnormality. There is a single 8 mm sclerotic lesion within the T4 vertebral body which is most likely a bone island, present since at least March 2020. Review of the MIP images confirms the above findings. IMPRESSION: Increased clot burden within the  right pulmonary artery and right lower lobe pulmonary emboli with similar clot burden in the right middle lobe, right upper lobe, and left lower lobe. Thin residual fiber strand/clot at the bifurcation of the pulmonary artery with decreased clot burden in the left upper lobe arterial branches. Persistent right heart strain with RV:LV ratio of 1.6 and reflux of contrast into the IVC. Findings are consistent with at least submassive (intermediate risk) PE. The presence of right heart strain has been associated with an increased risk of morbidity and mortality. Please refer to the "Code PE Focused" order set in EPIC. Increased peripheral ground-glass opacities in the upper lobes and superior segment of the right lower lobe, suspicious for developing pulmonary infarcts. Critical Value/emergent results were called by telephone at the time of interpretation on 03/23/2022 at 11:59 am to provider NEmeterio Reeve, who verbally acknowledged these results. Electronically Signed   By: JMaurine SimmeringM.D.   On: 03/23/2022 12:02            LOS: 16 days       NEmeterio Reeve DO [facility] 03/27/2022, 10:57 AM   Staff may message me via secure chat in [place] but this may not receive immediate response,  please page for urgent matters!  If 7PM-7AM, please contact night-coverage [email or web address]  Dictation software was used to generate the above note. Typos may occur and escape review, as with typed/written notes. Please contact Dr ASheppard Coildirectly for clarity if needed.

## 2022-03-28 DIAGNOSIS — R5381 Other malaise: Secondary | ICD-10-CM | POA: Diagnosis not present

## 2022-03-28 DIAGNOSIS — Z48812 Encounter for surgical aftercare following surgery on the circulatory system: Secondary | ICD-10-CM | POA: Diagnosis not present

## 2022-03-28 DIAGNOSIS — I5032 Chronic diastolic (congestive) heart failure: Secondary | ICD-10-CM | POA: Diagnosis not present

## 2022-03-28 DIAGNOSIS — E039 Hypothyroidism, unspecified: Secondary | ICD-10-CM | POA: Diagnosis not present

## 2022-03-28 DIAGNOSIS — D649 Anemia, unspecified: Secondary | ICD-10-CM | POA: Diagnosis not present

## 2022-03-28 DIAGNOSIS — I130 Hypertensive heart and chronic kidney disease with heart failure and stage 1 through stage 4 chronic kidney disease, or unspecified chronic kidney disease: Secondary | ICD-10-CM | POA: Diagnosis not present

## 2022-03-28 DIAGNOSIS — Z743 Need for continuous supervision: Secondary | ICD-10-CM | POA: Diagnosis not present

## 2022-03-28 DIAGNOSIS — I2510 Atherosclerotic heart disease of native coronary artery without angina pectoris: Secondary | ICD-10-CM | POA: Diagnosis not present

## 2022-03-28 DIAGNOSIS — I2699 Other pulmonary embolism without acute cor pulmonale: Secondary | ICD-10-CM | POA: Diagnosis not present

## 2022-03-28 DIAGNOSIS — F5101 Primary insomnia: Secondary | ICD-10-CM | POA: Diagnosis not present

## 2022-03-28 DIAGNOSIS — I82402 Acute embolism and thrombosis of unspecified deep veins of left lower extremity: Secondary | ICD-10-CM | POA: Diagnosis not present

## 2022-03-28 DIAGNOSIS — I10 Essential (primary) hypertension: Secondary | ICD-10-CM | POA: Diagnosis not present

## 2022-03-28 DIAGNOSIS — M1990 Unspecified osteoarthritis, unspecified site: Secondary | ICD-10-CM | POA: Diagnosis not present

## 2022-03-28 DIAGNOSIS — J95821 Acute postprocedural respiratory failure: Secondary | ICD-10-CM | POA: Diagnosis not present

## 2022-03-28 DIAGNOSIS — Z89512 Acquired absence of left leg below knee: Secondary | ICD-10-CM | POA: Diagnosis not present

## 2022-03-28 DIAGNOSIS — M6281 Muscle weakness (generalized): Secondary | ICD-10-CM | POA: Diagnosis not present

## 2022-03-28 DIAGNOSIS — I7090 Unspecified atherosclerosis: Secondary | ICD-10-CM | POA: Diagnosis not present

## 2022-03-28 DIAGNOSIS — J9601 Acute respiratory failure with hypoxia: Secondary | ICD-10-CM | POA: Diagnosis not present

## 2022-03-28 DIAGNOSIS — I480 Paroxysmal atrial fibrillation: Secondary | ICD-10-CM | POA: Diagnosis not present

## 2022-03-28 DIAGNOSIS — Z4781 Encounter for orthopedic aftercare following surgical amputation: Secondary | ICD-10-CM | POA: Diagnosis not present

## 2022-03-28 DIAGNOSIS — I824Y3 Acute embolism and thrombosis of unspecified deep veins of proximal lower extremity, bilateral: Secondary | ICD-10-CM | POA: Diagnosis not present

## 2022-03-28 DIAGNOSIS — F419 Anxiety disorder, unspecified: Secondary | ICD-10-CM | POA: Diagnosis not present

## 2022-03-28 DIAGNOSIS — K219 Gastro-esophageal reflux disease without esophagitis: Secondary | ICD-10-CM | POA: Diagnosis not present

## 2022-03-28 DIAGNOSIS — N183 Chronic kidney disease, stage 3 unspecified: Secondary | ICD-10-CM | POA: Diagnosis not present

## 2022-03-28 DIAGNOSIS — Z95828 Presence of other vascular implants and grafts: Secondary | ICD-10-CM | POA: Diagnosis not present

## 2022-03-28 LAB — BASIC METABOLIC PANEL
Anion gap: 6 (ref 5–15)
BUN: 45 mg/dL — ABNORMAL HIGH (ref 8–23)
CO2: 19 mmol/L — ABNORMAL LOW (ref 22–32)
Calcium: 8 mg/dL — ABNORMAL LOW (ref 8.9–10.3)
Chloride: 109 mmol/L (ref 98–111)
Creatinine, Ser: 1.81 mg/dL — ABNORMAL HIGH (ref 0.61–1.24)
GFR, Estimated: 39 mL/min — ABNORMAL LOW (ref >60)
Glucose, Bld: 121 mg/dL — ABNORMAL HIGH (ref 70–99)
Potassium: 4.7 mmol/L (ref 3.5–5.1)
Sodium: 134 mmol/L — ABNORMAL LOW (ref 135–145)

## 2022-03-28 LAB — GLUCOSE, CAPILLARY
Glucose-Capillary: 107 mg/dL — ABNORMAL HIGH (ref 70–99)
Glucose-Capillary: 198 mg/dL — ABNORMAL HIGH (ref 70–99)

## 2022-03-28 LAB — PROTIME-INR
INR: 2.9 — ABNORMAL HIGH (ref 0.8–1.2)
Prothrombin Time: 29.8 seconds — ABNORMAL HIGH (ref 11.4–15.2)

## 2022-03-28 MED ORDER — APIXABAN 5 MG PO TABS
10.0000 mg | ORAL_TABLET | Freq: Two times a day (BID) | ORAL | 0 refills | Status: DC
Start: 2022-03-28 — End: 2022-07-28

## 2022-03-28 MED ORDER — MELATONIN 5 MG PO TABS: 5.0000 mg | ORAL_TABLET | Freq: Every day | ORAL | 0 refills | Status: DC

## 2022-03-28 MED ORDER — APIXABAN 5 MG PO TABS: 5.0000 mg | ORAL_TABLET | Freq: Two times a day (BID) | ORAL | 0 refills | Status: DC

## 2022-03-28 MED ORDER — TRAZODONE HCL 50 MG PO TABS: 50.0000 mg | ORAL_TABLET | Freq: Every evening | ORAL | 0 refills | Status: DC | PRN

## 2022-03-28 MED ORDER — METOPROLOL TARTRATE 25 MG PO TABS: 12.5000 mg | ORAL_TABLET | Freq: Two times a day (BID) | ORAL | 0 refills | Status: AC

## 2022-03-28 MED ORDER — POLYETHYLENE GLYCOL 3350 17 G PO PACK: 17.0000 g | PACK | Freq: Two times a day (BID) | ORAL | 0 refills | Status: DC | PRN

## 2022-03-28 NOTE — TOC Transition Note (Signed)
Transition of Care [facility]) - CM/SW Discharge Note   Patient Details  Name: Phillip [name] MRN: [id number] Date of Birth: Aug 26, 1946  [facility]) CM/SW Contact:  Alberteen Sam, LCSW Phone Number: 03/28/2022, 1:15 PM   Clinical Narrative:     Patient will DC to: [facility] Anticipated DC date: 03/28/22 Family notified: Levada Dy Transport by: Johnanna Schneiders  Per MD patient ready for DC to [facility] . RN, patient, patient's family, and facility notified of DC. Discharge Summary sent to facility. RN given number for report  [phone]. DC packet on chart. Ambulance transport requested for patient.  CSW signing off.  Pricilla Riffle, LCSW    Final next level of care: Skilled Nursing Facility Barriers to Discharge: No Barriers Identified   Patient Goals and CMS Choice Patient states their goals for this hospitalization and ongoing recovery are:: to go home      Discharge Placement                       Discharge Plan and Services                                     Social Determinants of Health (SDOH) Interventions     Readmission Risk Interventions     No data to display

## 2022-03-28 NOTE — Progress Notes (Signed)
Physical Therapy Treatment Patient Details Name: Phillip [name] MRN: [id number] DOB: 1946/09/03 Today's Date: 03/28/2022   History of Present Illness Pt is a 76 y.o. 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  The patient had an artificial urinary sphincter placed 03/09/2022.  His Eliquis was held a week prior to the procedure.  1 to 2 days after being off Eliquis he started having pain in his left lower extremity.  His left lower extremity pain became progressively worse with his skin turning cold and blue and was associated with inability to bear weight on his left lower extremity.  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  Pt then developed bilateral PE and is s/p mechanical thrombectomy and thrombolysis 6/30 and additional mechanical thrombectomy on 7/5 with new orders placed.    PT Comments    Patient agreeable to PT. Education provided on stretching and positioning to prevent against knee and hip contracture with manual stretching of left hip flexor provided. Mobility training performed in bed with tips for increasing independence with routine repositioning in bed. Patient required assistance to get OOB to chair using rolling walker with standing activity tolerance limited by shortness of breath and limited endurance. Increased time and effort required with all activity with extra time required for rest breaks between bouts of activity. The patient remains motivated and would benefit from continued PT to maximize independence.    Recommendations for follow up therapy are one component of a multi-disciplinary discharge planning process, led by the attending physician.  Recommendations may be updated based on patient status,  additional functional criteria and insurance authorization.  Follow Up Recommendations  Skilled nursing-short term rehab (<3 hours/day) (CIR if activity tolerance improves) Can patient physically be transported by private vehicle: Yes   Assistance Recommended at Discharge Frequent or constant Supervision/Assistance  Patient can return home with the following A lot of help with walking and/or transfers;A lot of help with bathing/dressing/bathroom;Assistance with cooking/housework;Assist for transportation;Help with stairs or ramp for entrance   Equipment Recommendations  Rolling walker (2 wheels);Wheelchair (measurements PT);Wheelchair cushion (measurements PT)    Recommendations for Other Services       Precautions / Restrictions Precautions Precautions: Fall Restrictions Weight Bearing Restrictions: Yes LLE Weight Bearing: Non weight bearing     Mobility  Bed Mobility Overal bed mobility: Needs Assistance Bed Mobility: Rolling Rolling: Min assist (for rolling to right Min A, Min guard for rolling to left)   Supine to sit: Min guard Sit to supine: Min guard   General bed mobility comments: patient asked for tips to be more independent with bed mobility. educated patient on technique for rolling to left and right using bed rails to assist. increased time and effort required with all activity. patient initially sat up for ~ 3 minutes and returned to bed, requiring a rest break before sitting up again and getting to chair with assistance    Transfers Overall transfer level: Needs assistance Equipment used: Rolling walker (2 wheels) Transfers: Bed to chair/wheelchair/BSC     Step pivot transfers: Min assist       General transfer comment: verbal cues for technique using rolling walker, safe hand placement. increased time and effort required for step transfer to chair. increased work  of breathing while standing with activity tolerance limited by fatigue    Ambulation/Gait                General Gait Details: unable to progress to walking attempts due to limited standing endurance and fatigue with minimal activity in standing   Stairs             Wheelchair Mobility    Modified Rankin (Stroke Patients Only)       Balance   Sitting-balance support: No upper extremity supported, Feet supported Sitting balance-Leahy Scale: Good     Standing balance support: Bilateral upper extremity supported Standing balance-Leahy Scale: Poor Standing balance comment: external support required to maintain standing balance                            Cognition Arousal/Alertness: Awake/alert Behavior During Therapy: WFL for tasks assessed/performed Overall Cognitive Status: Within Functional Limits for tasks assessed                                 General Comments: cooperative, complains of feeling tired from limited sleep. able to follow single step commands with increased time        Exercises Total Joint Exercises Quad Sets: AROM, Strengthening, Left, 5 reps Other Exercises Other Exercises: manual left hip flexor stretching performed in mild hip extension while in sidelying position. educated patient on positioning and stretching techniques to prevent against contracture    General Comments General comments (skin integrity, edema, etc.): patient provided with HEP for BKA.      Pertinent Vitals/Pain Pain Assessment Pain Assessment: No/denies pain    Home Living                          Prior Function            PT Goals (current goals can now be found in the care plan section) Acute Rehab PT Goals Patient Stated Goal: to be as independent as possible PT Goal Formulation: With patient Time For Goal Achievement: 04/08/22 Potential to Achieve Goals: Good Progress towards PT goals: Progressing toward goals    Frequency    Min 2X/week      PT Plan Current plan remains appropriate     Co-evaluation              AM-PAC PT "6 Clicks" Mobility   Outcome Measure  Help needed turning from your back to your side while in a flat bed without using bedrails?: A Little Help needed moving from lying on your back to sitting on the side of a flat bed without using bedrails?: A Little Help needed moving to and from a bed to a chair (including a wheelchair)?: A Little Help needed standing up from a chair using your arms (e.g., wheelchair or bedside chair)?: A Lot Help needed to walk in hospital room?: A Lot Help needed climbing 3-5 steps with a railing? : Total 6 Click Score: 14    End of Session   Activity Tolerance: Patient limited by fatigue Patient left: in chair;with call bell/phone within reach;with chair alarm set Nurse Communication: Mobility status PT Visit Diagnosis: Other abnormalities of gait and mobility (R26.89);Muscle weakness (generalized) (M62.81);Pain     Time: 6222-9798 PT Time Calculation (min) (ACUTE ONLY): 54 min  Charges:  $Therapeutic Exercise: 23-37 mins $Therapeutic Activity: 23-37 mins  Minna Merritts, PT, MPT    Phillip [name] 03/28/2022, 1:04 PM

## 2022-03-28 NOTE — Progress Notes (Signed)
Report called to [facility] 

## 2022-03-28 NOTE — Plan of Care (Signed)
  Problem: Education: Goal: Knowledge of General Education information will improve Description: Including pain rating scale, medication(s)/side effects and non-pharmacologic comfort measures Outcome: Progressing   Problem: Health Behavior/Discharge Planning: Goal: Ability to manage health-related needs will improve Outcome: Progressing   Problem: Clinical Measurements: Goal: Ability to maintain clinical measurements within normal limits will improve Outcome: Progressing Goal: Will remain free from infection Outcome: Progressing Goal: Diagnostic test results will improve Outcome: Progressing Goal: Respiratory complications will improve Outcome: Progressing Goal: Cardiovascular complication will be avoided Outcome: Progressing   Problem: Activity: Goal: Risk for activity intolerance will decrease Outcome: Progressing   Problem: Nutrition: Goal: Adequate nutrition will be maintained Outcome: Progressing   Problem: Coping: Goal: Level of anxiety will decrease Outcome: Progressing   Problem: Elimination: Goal: Will not experience complications related to bowel motility Outcome: Progressing   Problem: Pain Managment: Goal: General experience of comfort will improve Outcome: Progressing   Problem: Safety: Goal: Ability to remain free from injury will improve Outcome: Progressing   Problem: Skin Integrity: Goal: Risk for impaired skin integrity will decrease Outcome: Progressing   Problem: Education: Goal: Understanding of CV disease, CV risk reduction, and recovery process will improve Outcome: Progressing   Problem: Activity: Goal: Ability to return to baseline activity level will improve Outcome: Progressing   Problem: Cardiovascular: Goal: Ability to achieve and maintain adequate cardiovascular perfusion will improve Outcome: Progressing Goal: Vascular access site(s) Level 0-1 will be maintained Outcome: Progressing   Problem: Health Behavior/Discharge  Planning: Goal: Ability to safely manage health-related needs after discharge will improve Outcome: Progressing   Problem: Education: Goal: Ability to describe self-care measures that may prevent or decrease complications (Diabetes Survival Skills Education) will improve Outcome: Progressing   Problem: Coping: Goal: Ability to adjust to condition or change in health will improve Outcome: Progressing   Problem: Fluid Volume: Goal: Ability to maintain a balanced intake and output will improve Outcome: Progressing   Problem: Health Behavior/Discharge Planning: Goal: Ability to identify and utilize available resources and services will improve Outcome: Progressing Goal: Ability to manage health-related needs will improve Outcome: Progressing   Problem: Metabolic: Goal: Ability to maintain appropriate glucose levels will improve Outcome: Progressing   Problem: Nutritional: Goal: Maintenance of adequate nutrition will improve Outcome: Progressing Goal: Progress toward achieving an optimal weight will improve Outcome: Progressing   Problem: Skin Integrity: Goal: Risk for impaired skin integrity will decrease Outcome: Progressing   Problem: Tissue Perfusion: Goal: Adequacy of tissue perfusion will improve Outcome: Progressing

## 2022-03-28 NOTE — Care Management Important Message (Signed)
Important Message  Patient Details  Name: Phillip [name] MRN: [id number] Date of Birth: 21-Mar-1946   Medicare Important Message Given:  Yes     Juliann Pulse A Hilton Saephan 03/28/2022, 2:36 PM

## 2022-03-28 NOTE — Discharge Summary (Signed)
Physician Discharge Summary   Patient: Phillip [name] MRN: [id number]  DOB: August 04, 1946   Admit:     Date of Admission: 03/11/2022 Admitted from: home   Discharge: Date of discharge: 03/28/22 Disposition: Skilled nursing facility Condition at discharge: good  CODE STATUS: FULL    Discharge Physician: Emeterio Reeve, DO [facility]     PCP: Rusty Aus, MD  Recommendations for Outpatient Follow-up:  Follow up with PCP Rusty Aus, MD in 1 weeks Please obtain labs/tests: BMP in 1 week at the latest, ideally needs BMP in 2-3 days from PCP or SNF attending provider  Please follow up on the following pending results: hypercoagulable workup with hematology Please confirm follow-up in place w/ HemOnc in 1-2 weeks   Discharge Instructions     Diet - low sodium heart healthy   Complete by: As directed    Discharge instructions   Complete by: As directed    Needs BMP in 2-3 days to monitor resolution/improvement of AKI. Continue to encourage po intake fluids.   Needs follow-up with Hematology team to review hypercoagulable labs which are pending.   Increase activity slowly   Complete by: As directed    No wound care   Complete by: As directed          Hospital Course:  Phillip [name] is a 76 y.o. male with medical history significant for recurrent DVTs, post operative PE on Eliquis (see detailed hematology note 03/25/2022), HTN, HLD, malignant neoplasm of prostate post resection, osteoarthritis, CAD status post PCI with stenting on Plavix, chronic diastolic CHF, who presents to [facility] ED 03/11/22 from his PCP's office, Dr. Sabra Heck, due to concern for critical left lower limb ischemia.  The patient had an artificial urinary sphincter placed on Wednesday, 03/09/2022.  His Eliquis was held a week prior to the procedure.  1 to 2 days after being off Eliquis he started having pain in his left lower extremity.  His left lower extremity pain became progressively  worse, 12/10 in the last 24 hours Patient was seen by vascular surgery, had a lower extremity bypass surgery on 6/23.  Went back to the OR on 6/24 for L LE thrombectomy.  Patient also placed on heparin drip. L leg unsalvageable, BKA scheduled. 6/28.  Developed episode of PSVT, hypoxia.  Appears to be flash pulm edema, given IV Lasix. BKA performed on 6/28. 6/30.  Patient developed hypoxia and tachycardia again with atrial fibrillation.  CT angiogram was performed 6/29 showed bilateral PE.  Thrombectomy performed on 6/30, heparin drip restarted. 7/1.  Patient had another episode of hypoxemia, requiring high flow oxygen.  Duplex ultrasound showed bilateral DVT, IVC filter placed. 7/5: repeat CTA, increased clot burden on R and evidence of R heart strain, L appears improved. Planning for repeat thrombectomy this afternoon. Cardiology has s/o  7/6: BP soft and tachypnea noted, reg HR this AM but increased later in the day, afebrile, SpO2 100% RA. Subjective improvement, no CP/SOB. Remains on argatroban infusion per vascular recs.   7/7: Consult placed to hematology to assist with guidance on anticoagulation going forward: recommend bridge argatroban to coumadin which was initiated w/ pharmacy consult (Dr Tasia Catchings d/w pharmacist Cristie Hem). Pending hypercoag w/u. Outpatient f/u hematology in 1-2 weeks after discharge  7/8: Chat with vascular and hematology, plan at this point is to place on Eliquis.  Discussion today with patient and his wife and son regarding options for Coumadin versus Eliquis, he states he would rather do Eliquis (  I did mention that there is a small but nonzero chance there may be a problem with Eliquis if he has antiphospholipid antibody syndrome but hematology believes this risk is low and find Eliquis to be an acceptable option)  7/9: off argatroban drip, moved out of SDU to med-surg w/ tele. Plan continue eliquis and pending placement to SNF hopefully tomorrow  7/10: remains stable and has bed  offer for [place]      Consultants:  Vascular Surgery Cardiology    Procedures: 03/11/22: LLE bypass surgery  06/24: LLE thrombectomy 03/16/22: L BKA 03/18/22: thrombectomy bilateral PE 03/19/22: IVC filter placed  03/23/22: thrombectomy bilateral PE    Discharge Diagnoses: Principal Problem:   Arterial occlusion Active Problems:   Bilateral pulmonary embolism ([place])   HTN (hypertension)   History of prostate cancer   Coronary artery disease   AKI (acute kidney injury) ([place])   Critical limb ischemia of left lower extremity with ulceration of lower leg ([place])   Acute respiratory failure with hypoxia (HCC)   Acute on chronic diastolic CHF (congestive heart failure) (HCC)   PSVT (paroxysmal supraventricular tachycardia) (HCC)   Paroxysmal atrial fibrillation with RVR ([place])   Acute bilateral deep vein thrombosis (DVT) of femoral veins (HCC)   Reactive thrombocytosis   Goals of care, counseling/discussion    Assessment & Plan:   Acute hypoxemic respiratory failure due to Bilateral pulmonary emboli from bilateral lower extremity DVT. Flash pulmonary edema secondary to acute on chronic diastolic congestive heart failure. Paroxysmal A fib Patient is status post pulmonary thrombectomy 06/30.  IVC filter placed 7/1 due to bilateral lower extremity DVT. Patient still had  a large burden of PE, underwent repeated pulmonary thrombectomy 07/05 and improved as of 07/06 Oxygenation has been stable on room air  OT/PT recommending SNF Hematology consulted 07/07-07/08, ok to start Eliquis Hypercoagulable workup pending, follow w/ hematology outpatient    SIRS criteria w/ tachypnea (likely clot burden in pulmonary vasculature) and elevated WBC (likely reactive but slowly trending up/down over course of admission) 07/01 CXR no infiltrate 07/05-07/06 WBC up, likely inflammatory/reactive to increased clot burden, appreciate hematology input 07/08 WBC trending down  Will need  serial CBC outpatient  Left lower extremity ischemia.  Status post BKA Failed effort with revascularization and thrombectomy. SNF pending for rehab   Acute kidney injury. Relatively stable, continue to follow Cr peaked 2.02 07/06 and has been trending down slowly Encourage po intake fluids  Will need serial BMP outpatient   Goals of care:  03/23/22 patient voiced to nurse that he wanted to be made full code again, I came to chat with him before putting in the official orders.  Will go ahead and designate him as full code, he does note that "I would not want to be kept alive on machines indefinitely" and verbalizes that if he were to suffer cardiac/respiratory arrest and need to be placed on life support, but unable to come off of that life support, he would want these measures to be withdrawn and allow for natural death    Insomnia. Patient slept better w/ Seroquel but still insomniac Added trazodone prn, will    History of bladder cancer s/p resection. History of prostate cancer. Urinary stress incontinence.  Status post artificial urinary sphincter on 6/21. No new issues   Coronary artery disease status post PCI and stenting. Continue Plavix, statin.   Essential hypertension. Continue metoprolol.      Discharge Instructions  Allergies as of 03/28/2022  Reactions   Doxazosin Other (See Comments)   Other reaction(s): Unknown        Medication List     STOP taking these medications    amLODipine 5 MG tablet Commonly known as: NORVASC   Aspirin Low Dose 81 MG tablet Generic drug: aspirin EC   atorvastatin 80 MG tablet Commonly known as: LIPITOR   cholecalciferol 25 MCG (1000 UNIT) tablet Commonly known as: VITAMIN D3   Chromium 1 MG Caps   isosorbide mononitrate 30 MG 24 hr tablet Commonly known as: IMDUR   loratadine 10 MG tablet Commonly known as: CLARITIN   methocarbamol 500 MG tablet Commonly known as: ROBAXIN   niacin 500 MG tablet    potassium phosphate (monobasic) 500 MG tablet Commonly known as: K-PHOS ORIGINAL   predniSONE 10 MG tablet Commonly known as: DELTASONE   sulfamethoxazole-trimethoprim 800-160 MG tablet Commonly known as: BACTRIM DS   testosterone cypionate 200 MG/ML injection Commonly known as: DEPOTESTOSTERONE CYPIONATE   triamcinolone cream 0.1 % Commonly known as: KENALOG   TURMERIC PO       TAKE these medications    acetaminophen 500 MG tablet Commonly known as: TYLENOL Take 500 mg by mouth every 6 (six) hours as needed.   apixaban 5 MG Tabs tablet Commonly known as: ELIQUIS Take 2 tablets (10 mg total) by mouth 2 (two) times daily for 9 doses. Start in evening 03/28/2022 (had AM dose is hospital) What changed:  how much to take additional instructions   apixaban 5 MG Tabs tablet Commonly known as: ELIQUIS Take 1 tablet (5 mg total) by mouth 2 (two) times daily. Start taking on: April 03, 2022 What changed: You were already taking a medication with the same name, and this prescription was added. Make sure you understand how and when to take each.   ascorbic acid 500 MG tablet Commonly known as: VITAMIN C Take 500 mg by mouth daily.   cetirizine 10 MG tablet Commonly known as: ZYRTEC Take 10 mg by mouth daily.   clopidogrel 75 MG tablet Commonly known as: PLAVIX Take 1 tablet (75 mg total) by mouth daily with breakfast.   GLUCOSAMINE CHONDR 1500 COMPLX PO Take 1 tablet by mouth every evening.   levothyroxine 50 MCG tablet Commonly known as: SYNTHROID Take 50 mcg by mouth.   melatonin 5 MG Tabs Take 1 tablet (5 mg total) by mouth at bedtime.   metoprolol tartrate 25 MG tablet Commonly known as: LOPRESSOR Take 0.5 tablets (12.5 mg total) by mouth 2 (two) times daily.   multivitamin tablet Take 1 tablet by mouth daily.   nitroGLYCERIN 0.4 MG SL tablet Commonly known as: NITROSTAT Place 1 tablet (0.4 mg total) under the tongue every 5 (five) minutes x 3 doses as  needed for chest pain.   pantoprazole 40 MG tablet Commonly known as: PROTONIX Take 1 tablet (40 mg total) by mouth daily.   polyethylene glycol 17 g packet Commonly known as: MIRALAX / GLYCOLAX Take 17 g by mouth 2 (two) times daily as needed for moderate constipation or mild constipation.   pravastatin 40 MG tablet Commonly known as: PRAVACHOL Take 40 mg by mouth daily.   traMADol 50 MG tablet Commonly known as: ULTRAM Take 50 mg by mouth every 6 (six) hours as needed for mild pain.   traZODone 50 MG tablet Commonly known as: DESYREL Take 1-2 tablets (50-100 mg total) by mouth at bedtime as needed for sleep.  Allergies  Allergen Reactions   Doxazosin Other (See Comments)    Other reaction(s): Unknown     Subjective: pt reports doing well today, no SOB, no chest pain.    Discharge Exam: Vitals:   03/28/22 0754 03/28/22 1155  BP: 112/62 121/60  Pulse: 80 90  Resp: 20 20  Temp: 98.5 F (36.9 C) (!) 97.2 F (36.2 C)  SpO2: 97%    General: Pt is alert, awake, not in acute distress Cardiovascular: RRR, S1/S2 +, no rubs, no gallops Respiratory: CTA bilaterally, no wheezing, no rhonchi Abdominal: Soft, NT, ND, bowel sounds + Extremities: no edema, no cyanosis     The results of significant diagnostics from this hospitalization (including imaging, microbiology, ancillary and laboratory) are listed below for reference.     Microbiology: No results found for this or any previous visit (from the past 240 hour(s)).   Labs: BNP (last 3 results) Recent Labs    03/17/22 0604  BNP 829.9*   Basic Metabolic Panel: Recent Labs  Lab 03/22/22 0409 03/23/22 0501 03/25/22 0443 03/26/22 0433 03/27/22 0518 03/28/22 0338  NA 139 139 134* 132* 132* 134*  K 4.3 4.3 4.4 4.8 4.6 4.7  CL 113* 111 107 106 107 109  CO2 22 22 20* 20* 18* 19*  GLUCOSE 147* 135* 151* 142* 146* 121*  BUN 28* 30* 49* 42* 47* 45*  CREATININE 1.39* 1.57* 2.02* 1.73* 1.89* 1.81*   CALCIUM 8.0* 8.2* 7.9* 8.0* 7.9* 8.0*  MG 2.2 2.2  --   --   --   --    Liver Function Tests: No results for input(s): "AST", "ALT", "ALKPHOS", "BILITOT", "PROT", "ALBUMIN" in the last 168 hours. No results for input(s): "LIPASE", "AMYLASE" in the last 168 hours. No results for input(s): "AMMONIA" in the last 168 hours. CBC: Recent Labs  Lab 03/23/22 0501 03/24/22 0347 03/25/22 0443 03/26/22 0433 03/27/22 0518  WBC 20.2* 22.5* 23.0* 21.8* 21.3*  HGB 9.2* 8.2* 8.0* 7.8* 7.5*  HCT 30.4* 27.0* 26.0* 25.5* 24.6*  MCV 89.9 90.6 89.3 91.1 91.4  PLT 768* 643* 565* 529* 531*   Cardiac Enzymes: No results for input(s): "CKTOTAL", "CKMB", "CKMBINDEX", "TROPONINI" in the last 168 hours. BNP: Invalid input(s): "POCBNP" CBG: Recent Labs  Lab 03/27/22 1145 03/27/22 1734 03/27/22 2126 03/28/22 0747 03/28/22 1133  GLUCAP 146* 175* 133* 107* 198*   D-Dimer No results for input(s): "DDIMER" in the last 72 hours. Hgb A1c No results for input(s): "HGBA1C" in the last 72 hours. Lipid Profile No results for input(s): "CHOL", "HDL", "LDLCALC", "TRIG", "CHOLHDL", "LDLDIRECT" in the last 72 hours. Thyroid function studies No results for input(s): "TSH", "T4TOTAL", "T3FREE", "THYROIDAB" in the last 72 hours.  Invalid input(s): "FREET3" Anemia work up Recent Labs    03/25/22 1521  RETICCTPCT 6.4*   Urinalysis    Component Value Date/Time   COLORURINE YELLOW (A) 02/13/2022 0744   APPEARANCEUR HAZY (A) 02/13/2022 0744   APPEARANCEUR Clear 10/25/2014 2206   LABSPEC 1.018 02/13/2022 0744   LABSPEC 1.016 10/25/2014 2206   PHURINE 5.0 02/13/2022 [place] 02/13/2022 0744   GLUCOSEU >=500 10/25/2014 2206   [place] 02/13/2022 [place] NEGATIVE 02/13/2022 0744   [place] Negative 10/25/2014 2206   [place] 02/13/2022 [place] NEGATIVE 02/13/2022 0744   NITRITE NEGATIVE 02/13/2022 0744   LEUKOCYTESUR NEGATIVE 02/13/2022 0744    LEUKOCYTESUR Negative 10/25/2014 2206   Sepsis Labs Recent Labs  Lab 03/24/22 3716 03/25/22 0443 03/26/22 9678  03/27/22 0518  WBC 22.5* 23.0* 21.8* 21.3*   Microbiology No results found for this or any previous visit (from the past 240 hour(s)). Imaging PERIPHERAL VASCULAR CATHETERIZATION  Result Date: 03/21/2022    Patient name: AJAI HARVILLE        MRN: [id number]        DOB: 1945/11/30          Sex: male  03/19/2022 Pre-operative Diagnosis: DVT Post-operative diagnosis:  Same Surgeon:  Annamarie Major Procedure Performed:            1.  U/s guided access, right femoral vein            2.  IVC venogram            3.  Placement of IVC filter    Indications: This is a 76 year old gentleman who underwent PE thrombectomy yesterday.  He had an acute pulmonary decompensation today while on anticoagulation.  The hospital team is worried about recurrent PE in the setting of anticoagulation and would like an IVC filter placed.  Informed consent was obtained via the son and the patient.  Procedure:  The patient was identified in the holding area and taken to the specials procedure room.  A timeout was called.  Ultrasound was used to evaluate the right common femoral vein which was widely patent and easily compressible.  1% lidocaine was used for local anesthesia.  The right common femoral vein was then cannulated under ultrasound guidance with a micropuncture needle.  A 018 wire was advanced without resistance followed placement of micropuncture sheath.  Next, a 035 wire was inserted and a 5 French sheath was placed.  A pigtail catheter was positioned in the right common iliac vein and a IVC venogram was performed which identified the location of the renal veins.  The IVC was of normal diameter without any anatomic variance.  A Amplatz wire was then inserted.  The filter introducing sheath was then placed up to the level of the renal veins.  A argon filter was then loaded into the sheath and then successfully  deployed landing at the level of the renal veins.  The filter was in good position without any tilt.  The sheath was then removed and manual pressure was held for hemostasis.  There were no immediate complications.    Impression:             #1  Successful placement of removable infrarenal IVC filter             #2  Normal venacavogram   V. Annamarie Major, M.D., FACS [facility] Office: [phone] Pager:  [phone]   US Venous Img Lower Bilateral (DVT)  Result Date: 03/19/2022 CLINICAL DATA:  History of DVT New shortness of breath Status post left below-knee amputation History of bladder and prostate malignancy EXAM: BILATERAL LOWER EXTREMITY VENOUS DOPPLER ULTRASOUND TECHNIQUE: Gray-scale sonography with graded compression, as well as color Doppler and duplex ultrasound were performed to evaluate the lower extremity deep venous systems from the level of the common femoral vein and including the common femoral, femoral, profunda femoral, popliteal and calf veins including the posterior tibial, peroneal and gastrocnemius veins when visible. The superficial great saphenous vein was also interrogated. Spectral Doppler was utilized to evaluate flow at rest and with distal augmentation maneuvers in the common femoral, femoral and popliteal veins. COMPARISON:  11/21/2021 FINDINGS: RIGHT LOWER EXTREMITY Common Femoral Vein: No evidence of thrombus. Normal compressibility, respiratory phasicity and response  to augmentation. Saphenofemoral Junction: No evidence of thrombus. Normal compressibility and flow on color Doppler imaging. Profunda Femoral Vein: No evidence of thrombus. Normal compressibility and flow on color Doppler imaging. Femoral Vein: Diffuse thrombosis. Popliteal Vein: Diffusely thrombosed. Calf Veins: Posterior tibial, peroneal, and gastrocnemius veins are thrombosed. Venous Reflux:  None. Other Findings:  None. LEFT LOWER EXTREMITY Common Femoral Vein: No evidence of  thrombus. Normal compressibility, respiratory phasicity and response to augmentation. Saphenofemoral Junction: No evidence of thrombus. Normal compressibility and flow on color Doppler imaging. Profunda Femoral Vein: Partially thrombosed. Femoral Vein: No evidence of thrombus. Normal compressibility, respiratory phasicity and response to augmentation. Popliteal Vein: No evidence of thrombus. Normal compressibility, respiratory phasicity and response to augmentation. Other Findings:  None. IMPRESSION: 1. Thrombosis of the right femoral, popliteal, and calf veins. 2. Partially thrombosed left Profunda femoris vein. Electronically Signed   By: Miachel Roux M.D.   On: 03/19/2022 16:20   DG Chest Port 1 View  Result Date: 03/19/2022 CLINICAL DATA:  Shortness of breath, respiratory distress, coronary artery disease, stage III chronic kidney disease, hypertension, prostate cancer EXAM: PORTABLE CHEST 1 VIEW COMPARISON:  Portable exam 1401 hours compared to 03/16/2022 FINDINGS: Mild enlargement of cardiac silhouette. Mediastinal contours and pulmonary vascularity normal. Lungs clear. No acute infiltrate, pleural effusion, or pneumothorax. Osseous structures unremarkable. IMPRESSION: No acute abnormalities. Electronically Signed   By: Lavonia Dana M.D.   On: 03/19/2022 14:19   PERIPHERAL VASCULAR CATHETERIZATION  Result Date: 03/18/2022 See surgical note for result.  ECHOCARDIOGRAM COMPLETE  Result Date: 03/18/2022    ECHOCARDIOGRAM REPORT   Patient Name:   JESE COMELLA Date of Exam: 03/18/2022 Medical Rec #:  [id number]        Height:       69.0 in Accession #:    [id number]       Weight:       161.2 lb Date of Birth:  03/15/1946        BSA:          1.885 m Patient Age:    65 years         BP:           126/84 mmHg Patient Gender: M                HR:           90 bpm. Exam Location:  [facility] Procedure: 2D Echo, Cardiac Doppler and Color Doppler Indications:     Atrial Fibrillation I48.91  History:         Patient  has prior history of Echocardiogram examinations, most                  recent 12/25/2020. CAD; Risk Factors:Hypertension.  Sonographer:     Sherrie Sport Referring Phys:  [id number] Sharen Hones Diagnosing Phys: Isaias Cowman MD  Sonographer Comments: Technically challenging study due to limited acoustic windows, no apical window and no subcostal window. IMPRESSIONS  1. Left ventricular ejection fraction, by estimation, is 60 to 65%. The left ventricle has normal function. The left ventricle has no regional wall motion abnormalities. Left ventricular diastolic parameters are indeterminate.  2. Right ventricular systolic function is normal. The right ventricular size is normal.  3. Large thrombus noted in right atrium.  4. The mitral valve is normal in structure. Mild mitral valve regurgitation. No evidence of mitral stenosis.  5. The aortic valve is normal in structure. Aortic valve regurgitation is not visualized. No aortic stenosis is  present.  6. The inferior vena cava is normal in size with greater than 50% respiratory variability, suggesting right atrial pressure of 3 mmHg. FINDINGS  Left Ventricle: RA thrombus noted. Left ventricular ejection fraction, by estimation, is 60 to 65%. The left ventricle has normal function. The left ventricle has no regional wall motion abnormalities. The left ventricular internal cavity size was normal in size. There is no left ventricular hypertrophy. Left ventricular diastolic parameters are indeterminate. Right Ventricle: The right ventricular size is normal. No increase in right ventricular wall thickness. Right ventricular systolic function is normal. Left Atrium: Left atrial size was normal in size. Right Atrium: Large thrombus noted in right atrium. Right atrial size was normal in size. Pericardium: There is no evidence of pericardial effusion. Mitral Valve: The mitral valve is normal in structure. Mild mitral valve regurgitation. No evidence of mitral valve stenosis.  Tricuspid Valve: The tricuspid valve is normal in structure. Tricuspid valve regurgitation is mild . No evidence of tricuspid stenosis. Aortic Valve: The aortic valve is normal in structure. Aortic valve regurgitation is not visualized. No aortic stenosis is present. Pulmonic Valve: The pulmonic valve was normal in structure. Pulmonic valve regurgitation is not visualized. No evidence of pulmonic stenosis. Aorta: The aortic root is normal in size and structure. Venous: The inferior vena cava is normal in size with greater than 50% respiratory variability, suggesting right atrial pressure of 3 mmHg. IAS/Shunts: No atrial level shunt detected by color flow Doppler.  LEFT VENTRICLE PLAX 2D LVIDd:         3.95 cm LVIDs:         2.60 cm LV PW:         1.30 cm LV IVS:        1.40 cm  Isaias Cowman MD Electronically signed by Isaias Cowman MD Signature Date/Time: 03/18/2022/1:00:12 PM    Final (Updated)       Time coordinating discharge: Over 30 minutes  SIGNED:  Emeterio Reeve DO [facility]

## 2022-03-30 ENCOUNTER — Telehealth: Payer: Self-pay | Admitting: Oncology

## 2022-03-30 LAB — DRVVT MIX: dRVVT Mix: 66.3 s — ABNORMAL HIGH (ref 0.0–40.4)

## 2022-03-30 LAB — PROTHROMBIN GENE MUTATION

## 2022-03-30 LAB — FACTOR 5 LEIDEN

## 2022-03-30 LAB — DRVVT CONFIRM: dRVVT Confirm: 0.8 ratio (ref 0.8–1.2)

## 2022-03-30 LAB — LUPUS ANTICOAGULANT PANEL
DRVVT: 78.7 s — ABNORMAL HIGH (ref 0.0–47.0)
PTT Lupus Anticoagulant: 67.1 s — ABNORMAL HIGH (ref 0.0–43.5)

## 2022-03-30 LAB — PTT-LA MIX: PTT-LA Mix: 67.8 s — ABNORMAL HIGH (ref 0.0–40.5)

## 2022-03-30 LAB — HEXAGONAL PHASE PHOSPHOLIPID: Hexagonal Phase Phospholipid: 10 s (ref 0–11)

## 2022-03-30 NOTE — Telephone Encounter (Signed)
Early Osmond from [place] called in to have pt scheduled for hospital fu, pt has been scheduled for MD appt only

## 2022-04-01 DIAGNOSIS — K219 Gastro-esophageal reflux disease without esophagitis: Secondary | ICD-10-CM | POA: Diagnosis not present

## 2022-04-01 DIAGNOSIS — M6281 Muscle weakness (generalized): Secondary | ICD-10-CM | POA: Diagnosis not present

## 2022-04-01 DIAGNOSIS — I480 Paroxysmal atrial fibrillation: Secondary | ICD-10-CM | POA: Diagnosis not present

## 2022-04-01 DIAGNOSIS — J95821 Acute postprocedural respiratory failure: Secondary | ICD-10-CM | POA: Diagnosis not present

## 2022-04-01 DIAGNOSIS — E039 Hypothyroidism, unspecified: Secondary | ICD-10-CM | POA: Diagnosis not present

## 2022-04-01 DIAGNOSIS — Z89512 Acquired absence of left leg below knee: Secondary | ICD-10-CM | POA: Diagnosis not present

## 2022-04-01 DIAGNOSIS — I2510 Atherosclerotic heart disease of native coronary artery without angina pectoris: Secondary | ICD-10-CM | POA: Diagnosis not present

## 2022-04-01 DIAGNOSIS — I10 Essential (primary) hypertension: Secondary | ICD-10-CM | POA: Diagnosis not present

## 2022-04-01 DIAGNOSIS — I82402 Acute embolism and thrombosis of unspecified deep veins of left lower extremity: Secondary | ICD-10-CM | POA: Diagnosis not present

## 2022-04-01 LAB — JAK2 V617F RFX CALR/MPL/E12-15: JAK2 V617F %: 24.74 %

## 2022-04-01 LAB — PROTHROMBIN GENE MUTATION

## 2022-04-01 LAB — FACTOR 5 LEIDEN

## 2022-04-04 DIAGNOSIS — K219 Gastro-esophageal reflux disease without esophagitis: Secondary | ICD-10-CM | POA: Diagnosis not present

## 2022-04-04 DIAGNOSIS — I10 Essential (primary) hypertension: Secondary | ICD-10-CM | POA: Diagnosis not present

## 2022-04-04 DIAGNOSIS — I82402 Acute embolism and thrombosis of unspecified deep veins of left lower extremity: Secondary | ICD-10-CM | POA: Diagnosis not present

## 2022-04-04 DIAGNOSIS — M6281 Muscle weakness (generalized): Secondary | ICD-10-CM | POA: Diagnosis not present

## 2022-04-04 DIAGNOSIS — J95821 Acute postprocedural respiratory failure: Secondary | ICD-10-CM | POA: Diagnosis not present

## 2022-04-04 DIAGNOSIS — E039 Hypothyroidism, unspecified: Secondary | ICD-10-CM | POA: Diagnosis not present

## 2022-04-04 DIAGNOSIS — Z89512 Acquired absence of left leg below knee: Secondary | ICD-10-CM | POA: Diagnosis not present

## 2022-04-04 DIAGNOSIS — I480 Paroxysmal atrial fibrillation: Secondary | ICD-10-CM | POA: Diagnosis not present

## 2022-04-04 DIAGNOSIS — I2510 Atherosclerotic heart disease of native coronary artery without angina pectoris: Secondary | ICD-10-CM | POA: Diagnosis not present

## 2022-04-05 ENCOUNTER — Other Ambulatory Visit (INDEPENDENT_AMBULATORY_CARE_PROVIDER_SITE_OTHER): Payer: Self-pay | Admitting: Vascular Surgery

## 2022-04-05 DIAGNOSIS — I10 Essential (primary) hypertension: Secondary | ICD-10-CM | POA: Diagnosis not present

## 2022-04-05 DIAGNOSIS — Z4781 Encounter for orthopedic aftercare following surgical amputation: Secondary | ICD-10-CM | POA: Diagnosis not present

## 2022-04-05 DIAGNOSIS — I480 Paroxysmal atrial fibrillation: Secondary | ICD-10-CM | POA: Diagnosis not present

## 2022-04-05 DIAGNOSIS — M6281 Muscle weakness (generalized): Secondary | ICD-10-CM | POA: Diagnosis not present

## 2022-04-05 DIAGNOSIS — K219 Gastro-esophageal reflux disease without esophagitis: Secondary | ICD-10-CM | POA: Diagnosis not present

## 2022-04-05 DIAGNOSIS — Z89512 Acquired absence of left leg below knee: Secondary | ICD-10-CM | POA: Diagnosis not present

## 2022-04-05 DIAGNOSIS — I2510 Atherosclerotic heart disease of native coronary artery without angina pectoris: Secondary | ICD-10-CM | POA: Diagnosis not present

## 2022-04-05 DIAGNOSIS — I7090 Unspecified atherosclerosis: Secondary | ICD-10-CM

## 2022-04-05 DIAGNOSIS — F5101 Primary insomnia: Secondary | ICD-10-CM | POA: Diagnosis not present

## 2022-04-05 DIAGNOSIS — I824Y3 Acute embolism and thrombosis of unspecified deep veins of proximal lower extremity, bilateral: Secondary | ICD-10-CM

## 2022-04-05 DIAGNOSIS — I82402 Acute embolism and thrombosis of unspecified deep veins of left lower extremity: Secondary | ICD-10-CM | POA: Diagnosis not present

## 2022-04-06 ENCOUNTER — Ambulatory Visit (INDEPENDENT_AMBULATORY_CARE_PROVIDER_SITE_OTHER): Payer: Medicare HMO

## 2022-04-06 ENCOUNTER — Ambulatory Visit (INDEPENDENT_AMBULATORY_CARE_PROVIDER_SITE_OTHER): Payer: Medicare HMO | Admitting: Nurse Practitioner

## 2022-04-06 ENCOUNTER — Encounter (INDEPENDENT_AMBULATORY_CARE_PROVIDER_SITE_OTHER): Payer: Self-pay | Admitting: Nurse Practitioner

## 2022-04-06 ENCOUNTER — Ambulatory Visit (INDEPENDENT_AMBULATORY_CARE_PROVIDER_SITE_OTHER): Payer: Self-pay

## 2022-04-06 VITALS — BP 119/71 | HR 81 | Resp 16

## 2022-04-06 DIAGNOSIS — I824Y3 Acute embolism and thrombosis of unspecified deep veins of proximal lower extremity, bilateral: Secondary | ICD-10-CM

## 2022-04-06 DIAGNOSIS — Z89512 Acquired absence of left leg below knee: Secondary | ICD-10-CM

## 2022-04-06 DIAGNOSIS — I82413 Acute embolism and thrombosis of femoral vein, bilateral: Secondary | ICD-10-CM

## 2022-04-06 DIAGNOSIS — I7090 Unspecified atherosclerosis: Secondary | ICD-10-CM

## 2022-04-08 DIAGNOSIS — I10 Essential (primary) hypertension: Secondary | ICD-10-CM | POA: Diagnosis not present

## 2022-04-08 DIAGNOSIS — M6281 Muscle weakness (generalized): Secondary | ICD-10-CM | POA: Diagnosis not present

## 2022-04-08 DIAGNOSIS — I82402 Acute embolism and thrombosis of unspecified deep veins of left lower extremity: Secondary | ICD-10-CM | POA: Diagnosis not present

## 2022-04-08 DIAGNOSIS — I2510 Atherosclerotic heart disease of native coronary artery without angina pectoris: Secondary | ICD-10-CM | POA: Diagnosis not present

## 2022-04-08 DIAGNOSIS — K219 Gastro-esophageal reflux disease without esophagitis: Secondary | ICD-10-CM | POA: Diagnosis not present

## 2022-04-08 DIAGNOSIS — F5101 Primary insomnia: Secondary | ICD-10-CM | POA: Diagnosis not present

## 2022-04-08 DIAGNOSIS — Z89512 Acquired absence of left leg below knee: Secondary | ICD-10-CM | POA: Diagnosis not present

## 2022-04-08 DIAGNOSIS — I480 Paroxysmal atrial fibrillation: Secondary | ICD-10-CM | POA: Diagnosis not present

## 2022-04-08 DIAGNOSIS — J95821 Acute postprocedural respiratory failure: Secondary | ICD-10-CM | POA: Diagnosis not present

## 2022-04-11 DIAGNOSIS — F5101 Primary insomnia: Secondary | ICD-10-CM | POA: Diagnosis not present

## 2022-04-11 DIAGNOSIS — Z89512 Acquired absence of left leg below knee: Secondary | ICD-10-CM | POA: Diagnosis not present

## 2022-04-11 DIAGNOSIS — I2510 Atherosclerotic heart disease of native coronary artery without angina pectoris: Secondary | ICD-10-CM | POA: Diagnosis not present

## 2022-04-11 DIAGNOSIS — J95821 Acute postprocedural respiratory failure: Secondary | ICD-10-CM | POA: Diagnosis not present

## 2022-04-11 DIAGNOSIS — K219 Gastro-esophageal reflux disease without esophagitis: Secondary | ICD-10-CM | POA: Diagnosis not present

## 2022-04-11 DIAGNOSIS — M6281 Muscle weakness (generalized): Secondary | ICD-10-CM | POA: Diagnosis not present

## 2022-04-11 DIAGNOSIS — I82402 Acute embolism and thrombosis of unspecified deep veins of left lower extremity: Secondary | ICD-10-CM | POA: Diagnosis not present

## 2022-04-11 DIAGNOSIS — I10 Essential (primary) hypertension: Secondary | ICD-10-CM | POA: Diagnosis not present

## 2022-04-11 DIAGNOSIS — I480 Paroxysmal atrial fibrillation: Secondary | ICD-10-CM | POA: Diagnosis not present

## 2022-04-14 DIAGNOSIS — I350 Nonrheumatic aortic (valve) stenosis: Secondary | ICD-10-CM | POA: Diagnosis not present

## 2022-04-14 DIAGNOSIS — I480 Paroxysmal atrial fibrillation: Secondary | ICD-10-CM | POA: Diagnosis not present

## 2022-04-14 DIAGNOSIS — N182 Chronic kidney disease, stage 2 (mild): Secondary | ICD-10-CM | POA: Diagnosis not present

## 2022-04-14 DIAGNOSIS — I2510 Atherosclerotic heart disease of native coronary artery without angina pectoris: Secondary | ICD-10-CM | POA: Diagnosis not present

## 2022-04-14 DIAGNOSIS — I5033 Acute on chronic diastolic (congestive) heart failure: Secondary | ICD-10-CM | POA: Diagnosis not present

## 2022-04-14 DIAGNOSIS — I471 Supraventricular tachycardia: Secondary | ICD-10-CM | POA: Diagnosis not present

## 2022-04-14 DIAGNOSIS — D62 Acute posthemorrhagic anemia: Secondary | ICD-10-CM | POA: Diagnosis not present

## 2022-04-14 DIAGNOSIS — Z4781 Encounter for orthopedic aftercare following surgical amputation: Secondary | ICD-10-CM | POA: Diagnosis not present

## 2022-04-14 DIAGNOSIS — I130 Hypertensive heart and chronic kidney disease with heart failure and stage 1 through stage 4 chronic kidney disease, or unspecified chronic kidney disease: Secondary | ICD-10-CM | POA: Diagnosis not present

## 2022-04-18 ENCOUNTER — Encounter (INDEPENDENT_AMBULATORY_CARE_PROVIDER_SITE_OTHER): Payer: Self-pay | Admitting: Nurse Practitioner

## 2022-04-18 ENCOUNTER — Ambulatory Visit (INDEPENDENT_AMBULATORY_CARE_PROVIDER_SITE_OTHER): Payer: Medicare HMO | Admitting: Nurse Practitioner

## 2022-04-18 VITALS — BP 109/70 | HR 76 | Resp 16

## 2022-04-18 DIAGNOSIS — Z89512 Acquired absence of left leg below knee: Secondary | ICD-10-CM

## 2022-04-18 NOTE — Progress Notes (Signed)
Subjective:    Patient ID: Phillip [name], male    DOB: [birth year], 76 y.o.   MRN: [id number] Chief Complaint  Patient presents with   Follow-up    Ultrasound follow up    Phillip [name] is a 76 year old male who returns today for follow-up evaluation after multiple vascular events following a recent hospitalization.  The patient underwent bladder surgery for which she had to stop his Eliquis for several days prior.  He presented to [facility] with a ischemic left foot.  He underwent intervention on 03/11/2022 with subsequent reintervention due to restenosis on 03/13/2022.  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  Shortly following his amputation the patient developed bilateral DVTs with subsequent pulmonary embolism with pulmonary thrombectomy on 03/18/2022.  Due to the clot burden he underwent IVC placement on 03/19/2022.  The patient subsequently had a worsening clot development and underwent additional pulmonary thrombectomy on 03/23/2022.  The patient has also had hypercoagulable work-up studies done, and continues to follow-up with heme-onc.  He presents today for staple removal of his left above-knee amputation.  The patient has been working with rehab and is making great strides.    Review of Systems  Neurological:  Positive for weakness.  All other systems reviewed and are negative.      Objective:   Physical Exam Vitals reviewed.  HENT:     Head: Normocephalic.  Cardiovascular:     Rate and Rhythm: Normal rate.  Pulmonary:     Effort: Pulmonary effort is normal.  Musculoskeletal:     Left lower leg: Edema present.     Left Lower Extremity: Left leg is amputated below knee.  Skin:    General: Skin is warm and dry.  Neurological:     Mental Status: He is alert and oriented to person, place, and time.  Psychiatric:        Mood and Affect: Mood normal.         Behavior: Behavior normal.        Thought Content: Thought content normal.        Judgment: Judgment normal.     BP 119/71 (BP Location: Left Arm)   Pulse 81   Resp 16   Past Medical History:  Diagnosis Date   Arthritis    Basal cell carcinoma    L clavicle, L prox forearm- removed years ago    Bladder cancer (HCC)    Bladder neck contracture    CAD (coronary artery disease)    a. 12/2020 NSTEMI/Cath: LM nl, LAD 95ost/p, LCX 50p, RCA nl. EF 45-50%.   Cataract    CKD (chronic kidney disease), stage III [facility])    ED (erectile dysfunction)    Frequency    GERD (gastroesophageal reflux disease)    History of pulmonary embolus (PE) 11/2018   a. Following LE DVT-->Chronic warfarin.   Hyperlipidemia LDL goal <70    Hypertension    Hypothyroidism    Incontinence of urine    sui, s/p cryoablation   Ischemic cardiomyopathy    a. 11/2018 Echo: EF 60-65%; b. 12/2020 LV Gram: EF 45-50% in setting of NSTEMI.   Kidney stones    Neuropathy    feet   Nocturia    Prostate cancer ([place])    S/P   CRYOABLATION   Right elbow tendinitis    Right Lower Extremity DVT (deep venous thrombosis) ([place]) 11/25/2018   Vertigo  1-2x/yr   Wears glasses     Social History   Socioeconomic History   Marital status: Married    Spouse name: Phillip [name]   Number of children: Not on file   Years of education: Not on file   Highest education level: Not on file  Occupational History   Not on file  Tobacco Use   Smoking status: Never   Smokeless tobacco: Never  Vaping Use   Vaping Use: Never used  Substance and Sexual Activity   Alcohol use: No   Drug use: No   Sexual activity: Not Currently    Birth control/protection: None  Other Topics Concern   Not on file  Social History Narrative   Lives locally with wife.  Exercises regularly - gym/golf.  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   Social Determinants of Health   Financial Resource Strain: Not on  file  Food Insecurity: Not on file  Transportation Needs: Not on file  Physical Activity: Not on file  Stress: Not on file  Social Connections: Not on file  Intimate Partner Violence: Not on file    Past Surgical History:  Procedure Laterality Date   AMPUTATION Left 03/16/2022   Procedure: AMPUTATION BELOW KNEE;  Surgeon: Katha Cabal, MD;  Location: [facility];  Service: Vascular;  Laterality: Left;   ARTHROPLASTY AND TENDON REPAIR LEFT THUMB  JAN 2014   CATARACT EXTRACTION W/PHACO Left 11/16/2020   Procedure: CATARACT EXTRACTION PHACO AND INTRAOCULAR LENS PLACEMENT (IOC) LEFT  7.09 00:56.4 12.6%;  Surgeon: Leandrew Koyanagi, MD;  Location: [place];  Service: Ophthalmology;  Laterality: Left;   CATARACT EXTRACTION W/PHACO Right 12/02/2020   Procedure: CATARACT EXTRACTION PHACO AND INTRAOCULAR LENS PLACEMENT (IOC) RIGHT MALYUGIN 5.52 00:49.7 11.1%;  Surgeon: Leandrew Koyanagi, MD;  Location: [place];  Service: Ophthalmology;  Laterality: Right;   COLONOSCOPY     COLONOSCOPY WITH PROPOFOL N/A 08/09/2017   Procedure: COLONOSCOPY WITH PROPOFOL;  Surgeon: Manya Silvas, MD;  Location: [facility] END[facility]COPY;  Service: Endoscopy;  Laterality: N/A;   CORONARY ANGIOGRAPHY N/A 12/28/2020   Procedure: CORONARY ANGIOGRAPHY;  Surgeon: Sherren Mocha, MD;  Location: [place] CV LAB;  Service: Cardiovascular;  Laterality: N/A;   CORONARY STENT INTERVENTION N/A 12/28/2020   Procedure: CORONARY STENT INTERVENTION;  Surgeon: Sherren Mocha, MD;  Location: [place] [facility];  Service: Cardiovascular;  Laterality: N/A;   ESOPHAGOGASTRODUODENOSCOPY (EGD) WITH PROPOFOL N/A 08/09/2017   Procedure: ESOPHAGOGASTRODUODENOSCOPY (EGD) WITH PROPOFOL;  Surgeon: Manya Silvas, MD;  Location: [facility] ENDOSCOPY;  Service: Endoscopy;  Laterality: N/A;   GREEN LIGHT LASER TURP (TRANSURETHRAL RESECTION OF PROSTATE N/A 12/14/2015   Procedure: GREEN LIGHT LASER TURP (TRANSURETHRAL RESECTION OF  PROSTATE) LASER OF BLADDER NECK CONTRACTURE;  Surgeon: Carolan Clines, MD;  Location: [place];  Service: Urology;  Laterality: N/A;   IVC FILTER INSERTION N/A 03/19/2022   Procedure: IVC FILTER INSERTION;  Surgeon: Serafina Mitchell, MD;  Location: [place] [facility];  Service: Cardiovascular;  Laterality: N/A;   LEFT HEART CATH AND CORONARY ANGIOGRAPHY N/A 12/24/2020   Procedure: LEFT HEART CATH AND CORONARY ANGIOGRAPHY and possible PCI and stent;  Surgeon: Yolonda Kida, MD;  Location: [place] [facility];  Service: Cardiovascular;  Laterality: N/A;   LEFT HEART CATH AND CORONARY ANGIOGRAPHY N/A 01/06/2021   Procedure: LEFT HEART CATH AND CORONARY ANGIOGRAPHY;  Surgeon: Troy Sine, MD;  Location: [place] [facility];  Service: Cardiovascular;  Laterality:  N/A;   LOWER EXTREMITY ANGIOGRAPHY Left 03/11/2022   Procedure: Lower Extremity Angiography;  Surgeon: Katha Cabal, MD;  Location: [place] [facility];  Service: Cardiovascular;  Laterality: Left;   PENILE PROSTHESIS IMPLANT N/A 12/06/2013   Procedure: PENILE PROTHESIS INFLATABLE;  Surgeon: Ailene Rud, MD;  Location: [facility];  Service: Urology;  Laterality: N/A;   PROSTATE CRYOABLATION  06-01-2012  [facility]   PULMONARY THROMBECTOMY N/A 03/18/2022   Procedure: PULMONARY THROMBECTOMY;  Surgeon: Algernon Huxley, MD;  Location: [place] [facility];  Service: Cardiovascular;  Laterality: N/A;   PULMONARY THROMBECTOMY Bilateral 03/23/2022   Procedure: PULMONARY THROMBECTOMY;  Surgeon: Katha Cabal, MD;  Location: [place] [facility];  Service: Cardiovascular;  Laterality: Bilateral;   THROMBECTOMY ILIAC ARTERY Left 03/12/2022   Procedure: THROMBECTOMY LEG;  Surgeon: Conrad Audubon, MD;  Location: [facility];  Service: Vascular;  Laterality: Left;   TRANSURETHRAL RESECTION OF BLADDER NECK N/A 03/20/2015   Procedure: RELEASE BLADDER NECK CONTRACTURE  WITH WOLF BUTTON ELECTRODE ;  Surgeon: Carolan Clines, MD;  Location: [place];  Service: Urology;  Laterality: N/A;   TRANSURETHRAL RESECTION OF BLADDER TUMOR N/A 12/05/2018   Procedure: TRANSURETHRAL RESECTION OF BLADDER TUMOR (TURBT)/CYSTOSCOPY/  INSTILLATION OF Rodell Perna;  Surgeon: Ceasar Mons, MD;  Location: [facility];  Service: Urology;  Laterality: N/A;   TRANSURETHRAL RESECTION OF BLADDER TUMOR N/A 01/15/2020   Procedure: TRANSURETHRAL RESECTION OF BLADDER TUMOR (TURBT)/ CYSTOSCOPY;  Surgeon: Ceasar Mons, MD;  Location: [facility];  Service: Urology;  Laterality: N/A;   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      Family History  Problem Relation Age of Onset   CAD Father 77    Allergies  Allergen Reactions   Doxazosin Other (See Comments)    Other reaction(s): Unknown       Latest Ref Rng & Units 03/27/2022    5:18 AM 03/26/2022    4:33 AM 03/25/2022    4:43 AM  CBC  WBC 4.0 - 10.5 K/uL 21.3  21.8  23.0   Hemoglobin 13.0 - 17.0 g/dL 7.5  7.8  8.0   Hematocrit 39.0 - 52.0 % 24.6  25.5  26.0   Platelets 150 - 400 K/uL 531  529  565       CMP     Component Value Date/Time   NA 134 (L) 03/28/2022 0338   NA 141 10/25/2014 2206   K 4.7 03/28/2022 0338   K 4.0 10/25/2014 2206   CL 109 03/28/2022 0338   CL 109 (H) 10/25/2014 2206   CO2 19 (L) 03/28/2022 0338   CO2 26 10/25/2014 2206   GLUCOSE 121 (H) 03/28/2022 0338   GLUCOSE 210 (H) 10/25/2014 2206   BUN 45 (H) 03/28/2022 0338   BUN 21 08/15/2016 1618   BUN 15 10/25/2014 2206   CREATININE 1.81 (H) 03/28/2022 0338   CREATININE 1.32 (H) 10/25/2014 2206   CALCIUM 8.0 (L) 03/28/2022 0338   CALCIUM 8.2 (L) 10/25/2014 2206   PROT 5.9 (L) 03/13/2022 0114   PROT 7.0 10/25/2014 2206   ALBUMIN 3.2 (L) 03/13/2022 0114   ALBUMIN 3.2 (L) 10/25/2014 2206   AST 23 03/13/2022 0114   AST 20 10/25/2014 2206   ALT 19 03/13/2022 0114   ALT 22 10/25/2014 2206   ALKPHOS 43  03/13/2022 0114   ALKPHOS 58 10/25/2014 2206   BILITOT 0.7 03/13/2022 0114   BILITOT  0.2 10/25/2014 2206   GFRNONAA 39 (L) 03/28/2022 0338   GFRNONAA 58 (L) 10/25/2014 2206   GFRAA >60 03/27/2019 0637   GFRAA >60 10/25/2014 2206     No results found.     Assessment & Plan:   1. Acute bilateral deep vein thrombosis (DVT) of femoral veins (HCC) Today noninvasive studies show findings consistent with a subacute thrombus in the bilateral lower extremities.  Studies are slightly improved from initial detection several weeks ago.  Patient will continue with anticoagulation as well as continue follow-up with heme-onc for hypercoagulable work-up.  2. Hx of BKA, left ([place]) Other staples removed today.  Patient tolerated this well.  We will have the patient return in 1 to 2 weeks for further staple removal.   Current Outpatient Medications on File Prior to Visit  Medication Sig Dispense Refill   acetaminophen (TYLENOL) 500 MG tablet Take 500 mg by mouth every 6 (six) hours as needed.     apixaban (ELIQUIS) 5 MG TABS tablet Take 2 tablets (10 mg total) by mouth 2 (two) times daily for 9 doses. Start in evening 03/28/2022 (had AM dose is hospital) 18 tablet 0   apixaban (ELIQUIS) 5 MG TABS tablet Take 1 tablet (5 mg total) by mouth 2 (two) times daily. 60 tablet 0   ascorbic acid (VITAMIN C) 500 MG tablet Take 500 mg by mouth daily.     cetirizine (ZYRTEC) 10 MG tablet Take 10 mg by mouth daily.     clopidogrel (PLAVIX) 75 MG tablet Take 1 tablet (75 mg total) by mouth daily with breakfast. 90 tablet 3   Glucosamine-Chondroit-Vit C-Mn (GLUCOSAMINE CHONDR 1500 COMPLX PO) Take 1 tablet by mouth every evening.     levothyroxine (SYNTHROID) 50 MCG tablet Take 50 mcg by mouth.     melatonin 5 MG TABS Take 1 tablet (5 mg total) by mouth at bedtime. 30 tablet 0   metoprolol tartrate (LOPRESSOR) 25 MG tablet Take 0.5 tablets (12.5 mg total) by mouth 2 (two) times daily. 60 tablet 0   Multiple Vitamin  (MULTIVITAMIN) tablet Take 1 tablet by mouth daily.     nitroGLYCERIN (NITROSTAT) 0.4 MG SL tablet Place 1 tablet (0.4 mg total) under the tongue every 5 (five) minutes x 3 doses as needed for chest pain. 25 tablet 3   pantoprazole (PROTONIX) 40 MG tablet Take 1 tablet (40 mg total) by mouth daily. 90 tablet 3   polyethylene glycol (MIRALAX / GLYCOLAX) 17 g packet Take 17 g by mouth 2 (two) times daily as needed for moderate constipation or mild constipation. 14 each 0   pravastatin (PRAVACHOL) 40 MG tablet Take 40 mg by mouth daily.     traMADol (ULTRAM) 50 MG tablet Take 50 mg by mouth every 6 (six) hours as needed for mild pain.     traZODone (DESYREL) 50 MG tablet Take 1-2 tablets (50-100 mg total) by mouth at bedtime as needed for sleep. 60 tablet 0   No current facility-administered medications on file prior to visit.    There are no Patient Instructions on file for this visit. No follow-ups on file.   Kris Hartmann, NP

## 2022-04-19 ENCOUNTER — Encounter (INDEPENDENT_AMBULATORY_CARE_PROVIDER_SITE_OTHER): Payer: Self-pay | Admitting: Nurse Practitioner

## 2022-04-19 NOTE — Progress Notes (Signed)
Subjective:    Patient ID: Phillip [name], male    DOB: 1946-08-10, 76 y.o.   MRN: [id number] No chief complaint on file.   Phillip [name] is a 76 year old male who returns today for follow-up evaluation after multiple vascular events following a recent hospitalization.  The patient underwent bladder surgery for which she had to stop his Eliquis for several days prior.  He presented to [facility] with a ischemic left foot.  He underwent intervention on 03/11/2022 with subsequent reintervention due to restenosis on 03/13/2022.  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  Shortly following his amputation the patient developed bilateral DVTs with subsequent pulmonary embolism with pulmonary thrombectomy on 03/18/2022.  Due to the clot burden he underwent IVC placement on 03/19/2022.  The patient subsequently had a worsening clot development and underwent additional pulmonary thrombectomy on 03/23/2022.  The patient has also had hypercoagulable work-up studies done, and continues to follow-up with heme-onc.  He presents today for staple removal of his left above-knee amputation.  The patient has returned home from rehab and continues to do well.  Following previous debridement with no dehiscence and the wound continues to heal very well.    Review of Systems  Skin:  Positive for wound.  All other systems reviewed and are negative.      Objective:   Physical Exam Vitals reviewed.  HENT:     Head: Normocephalic.  Cardiovascular:     Rate and Rhythm: Normal rate.     Pulses:          Dorsalis pedis pulses are 1+ on the right side.       Posterior tibial pulses are 1+ on the right side.  Pulmonary:     Effort: Pulmonary effort is normal.  Skin:    General: Skin is warm and dry.  Neurological:     Mental Status: He is alert and oriented to person, place, and time.   Psychiatric:        Mood and Affect: Mood normal.        Behavior: Behavior normal.        Thought Content: Thought content normal.        Judgment: Judgment normal.     BP 109/70 (BP Location: Left Arm)   Pulse 76   Resp 16   Past Medical History:  Diagnosis Date   Arthritis    Basal cell carcinoma    L clavicle, L prox forearm- removed years ago    Bladder cancer (HCC)    Bladder neck contracture    CAD (coronary artery disease)    a. 12/2020 NSTEMI/Cath: LM nl, LAD 95ost/p, LCX 50p, RCA nl. EF 45-50%.   Cataract    CKD (chronic kidney disease), stage III [facility])    ED (erectile dysfunction)    Frequency    GERD (gastroesophageal reflux disease)    History of pulmonary embolus (PE) 11/2018   a. Following LE DVT-->Chronic warfarin.   Hyperlipidemia LDL goal <70    Hypertension    Hypothyroidism    Incontinence of urine    sui, s/p cryoablation   Ischemic cardiomyopathy    a. 11/2018 Echo: EF 60-65%; b. 12/2020 LV Gram: EF 45-50% in setting of NSTEMI.   Kidney stones    Neuropathy    feet   Nocturia    Prostate cancer ([place])    S/P   CRYOABLATION   Right elbow  tendinitis    Right Lower Extremity DVT (deep venous thrombosis) (HCC) 11/25/2018   Vertigo    1-2x/yr   Wears glasses     Social History   Socioeconomic History   Marital status: Married    Spouse name: Phillip [name]   Number of children: Not on file   Years of education: Not on file   Highest education level: Not on file  Occupational History   Not on file  Tobacco Use   Smoking status: Never   Smokeless tobacco: Never  Vaping Use   Vaping Use: Never used  Substance and Sexual Activity   Alcohol use: No   Drug use: No   Sexual activity: Not Currently    Birth control/protection: None  Other Topics Concern   Not on file  Social History Narrative   Lives locally with wife.  Exercises regularly - gym/golf.  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   Social  Determinants of Health   Financial Resource Strain: Not on file  Food Insecurity: Not on file  Transportation Needs: Not on file  Physical Activity: Not on file  Stress: Not on file  Social Connections: Not on file  Intimate Partner Violence: Not on file    Past Surgical History:  Procedure Laterality Date   AMPUTATION Left 03/16/2022   Procedure: AMPUTATION BELOW KNEE;  Surgeon: Katha Cabal, MD;  Location: [facility];  Service: Vascular;  Laterality: Left;   ARTHROPLASTY AND TENDON REPAIR LEFT THUMB  JAN 2014   CATARACT EXTRACTION W/PHACO Left 11/16/2020   Procedure: CATARACT EXTRACTION PHACO AND INTRAOCULAR LENS PLACEMENT (IOC) LEFT  7.09 00:56.4 12.6%;  Surgeon: Leandrew Koyanagi, MD;  Location: [place];  Service: Ophthalmology;  Laterality: Left;   CATARACT EXTRACTION W/PHACO Right 12/02/2020   Procedure: CATARACT EXTRACTION PHACO AND INTRAOCULAR LENS PLACEMENT (IOC) RIGHT MALYUGIN 5.52 00:49.7 11.1%;  Surgeon: Leandrew Koyanagi, MD;  Location: [place];  Service: Ophthalmology;  Laterality: Right;   COLONOSCOPY     COLONOSCOPY WITH PROPOFOL N/A 08/09/2017   Procedure: COLONOSCOPY WITH PROPOFOL;  Surgeon: Manya Silvas, MD;  Location: [facility];  Service: Endoscopy;  Laterality: N/A;   CORONARY ANGIOGRAPHY N/A 12/28/2020   Procedure: CORONARY ANGIOGRAPHY;  Surgeon: Sherren Mocha, MD;  Location: [place] [facility];  Service: Cardiovascular;  Laterality: N/A;   CORONARY STENT INTERVENTION N/A 12/28/2020   Procedure: CORONARY STENT INTERVENTION;  Surgeon: Sherren Mocha, MD;  Location: [place] [facility];  Service: Cardiovascular;  Laterality: N/A;   ESOPHAGOGASTRODUODENOSCOPY (EGD) WITH PROPOFOL N/A 08/09/2017   Procedure: ESOPHAGOGASTRODUODENOSCOPY (EGD) WITH PROPOFOL;  Surgeon: Manya Silvas, MD;  Location: [facility];  Service: Endoscopy;  Laterality: N/A;   GREEN LIGHT LASER TURP (TRANSURETHRAL RESECTION OF PROSTATE N/A 12/14/2015    Procedure: GREEN LIGHT LASER TURP (TRANSURETHRAL RESECTION OF PROSTATE) LASER OF BLADDER NECK CONTRACTURE;  Surgeon: Carolan Clines, MD;  Location: [place];  Service: Urology;  Laterality: N/A;   IVC FILTER INSERTION N/A 03/19/2022   Procedure: IVC FILTER INSERTION;  Surgeon: Serafina Mitchell, MD;  Location: [place] [facility];  Service: Cardiovascular;  Laterality: N/A;   LEFT HEART CATH AND CORONARY ANGIOGRAPHY N/A 12/24/2020   Procedure: LEFT HEART CATH AND CORONARY ANGIOGRAPHY and possible PCI and stent;  Surgeon: Yolonda Kida, MD;  Location: [place] [facility];  Service: Cardiovascular;  Laterality: N/A;   LEFT HEART CATH AND CORONARY ANGIOGRAPHY N/A 01/06/2021   Procedure: LEFT HEART CATH AND  CORONARY ANGIOGRAPHY;  Surgeon: Troy Sine, MD;  Location: [place] [facility];  Service: Cardiovascular;  Laterality: N/A;   LOWER EXTREMITY ANGIOGRAPHY Left 03/11/2022   Procedure: Lower Extremity Angiography;  Surgeon: Katha Cabal, MD;  Location: [place] [facility];  Service: Cardiovascular;  Laterality: Left;   PENILE PROSTHESIS IMPLANT N/A 12/06/2013   Procedure: PENILE PROTHESIS INFLATABLE;  Surgeon: Ailene Rud, MD;  Location: [facility];  Service: Urology;  Laterality: N/A;   PROSTATE CRYOABLATION  06-01-2012  [facility]   PULMONARY THROMBECTOMY N/A 03/18/2022   Procedure: PULMONARY THROMBECTOMY;  Surgeon: Algernon Huxley, MD;  Location: [place] [facility];  Service: Cardiovascular;  Laterality: N/A;   PULMONARY THROMBECTOMY Bilateral 03/23/2022   Procedure: PULMONARY THROMBECTOMY;  Surgeon: Katha Cabal, MD;  Location: [place] [facility];  Service: Cardiovascular;  Laterality: Bilateral;   THROMBECTOMY ILIAC ARTERY Left 03/12/2022   Procedure: THROMBECTOMY LEG;  Surgeon: Conrad Fountain, MD;  Location: [facility];  Service: Vascular;  Laterality: Left;   TRANSURETHRAL RESECTION OF BLADDER NECK N/A 03/20/2015   Procedure: RELEASE BLADDER  NECK CONTRACTURE  WITH WOLF BUTTON ELECTRODE ;  Surgeon: Carolan Clines, MD;  Location: [place];  Service: Urology;  Laterality: N/A;   TRANSURETHRAL RESECTION OF BLADDER TUMOR N/A 12/05/2018   Procedure: TRANSURETHRAL RESECTION OF BLADDER TUMOR (TURBT)/CYSTOSCOPY/  INSTILLATION OF Rodell Perna;  Surgeon: Ceasar Mons, MD;  Location: [facility];  Service: Urology;  Laterality: N/A;   TRANSURETHRAL RESECTION OF BLADDER TUMOR N/A 01/15/2020   Procedure: TRANSURETHRAL RESECTION OF BLADDER TUMOR (TURBT)/ CYSTOSCOPY;  Surgeon: Ceasar Mons, MD;  Location: [facility];  Service: Urology;  Laterality: N/A;   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      Family History  Problem Relation Age of Onset   CAD Father 68    Allergies  Allergen Reactions   Doxazosin Other (See Comments)    Other reaction(s): Unknown       Latest Ref Rng & Units 03/27/2022    5:18 AM 03/26/2022    4:33 AM 03/25/2022    4:43 AM  CBC  WBC 4.0 - 10.5 K/uL 21.3  21.8  23.0   Hemoglobin 13.0 - 17.0 g/dL 7.5  7.8  8.0   Hematocrit 39.0 - 52.0 % 24.6  25.5  26.0   Platelets 150 - 400 K/uL 531  529  565       CMP     Component Value Date/Time   NA 134 (L) 03/28/2022 0338   NA 141 10/25/2014 2206   K 4.7 03/28/2022 0338   K 4.0 10/25/2014 2206   CL 109 03/28/2022 0338   CL 109 (H) 10/25/2014 2206   CO2 19 (L) 03/28/2022 0338   CO2 26 10/25/2014 2206   GLUCOSE 121 (H) 03/28/2022 0338   GLUCOSE 210 (H) 10/25/2014 2206   BUN 45 (H) 03/28/2022 0338   BUN 21 08/15/2016 1618   BUN 15 10/25/2014 2206   CREATININE 1.81 (H) 03/28/2022 0338   CREATININE 1.32 (H) 10/25/2014 2206   CALCIUM 8.0 (L) 03/28/2022 0338   CALCIUM 8.2 (L) 10/25/2014 2206   PROT 5.9 (L) 03/13/2022 0114   PROT 7.0 10/25/2014 2206   ALBUMIN 3.2 (L) 03/13/2022 0114   ALBUMIN 3.2 (L) 10/25/2014 2206   AST 23 03/13/2022 0114   AST 20 10/25/2014 2206    ALT 19 03/13/2022 0114   ALT 22 10/25/2014 2206  ALKPHOS 43 03/13/2022 0114   ALKPHOS 58 10/25/2014 2206   BILITOT 0.7 03/13/2022 0114   BILITOT 0.2 10/25/2014 2206   GFRNONAA 39 (L) 03/28/2022 0338   GFRNONAA 58 (L) 10/25/2014 2206   GFRAA >60 03/27/2019 0637   GFRAA >60 10/25/2014 2206     No results found.     Assessment & Plan:   1. Hx of BKA, left ([place]) The rest of the staples and the left below-knee amputation were removed.  The patient tolerated this well.  The wound was wrapped with Ace bandage.  We will anticipate the patient returning in 2 to 3 weeks for wound reevaluation and ABIs.  I anticipate that the wound will be fully healed and at that point we can send referral to [facility] clinic for shrinker sock as well as prosthetic work-up.   Current Outpatient Medications on File Prior to Visit  Medication Sig Dispense Refill   acetaminophen (TYLENOL) 500 MG tablet Take 500 mg by mouth every 6 (six) hours as needed.     apixaban (ELIQUIS) 5 MG TABS tablet Take 1 tablet (5 mg total) by mouth 2 (two) times daily. 60 tablet 0   ascorbic acid (VITAMIN C) 500 MG tablet Take 500 mg by mouth daily.     cetirizine (ZYRTEC) 10 MG tablet Take 10 mg by mouth daily.     clopidogrel (PLAVIX) 75 MG tablet Take 1 tablet (75 mg total) by mouth daily with breakfast. 90 tablet 3   Glucosamine-Chondroit-Vit C-Mn (GLUCOSAMINE CHONDR 1500 COMPLX PO) Take 1 tablet by mouth every evening.     melatonin 5 MG TABS Take 1 tablet (5 mg total) by mouth at bedtime. 30 tablet 0   metoprolol tartrate (LOPRESSOR) 25 MG tablet Take 0.5 tablets (12.5 mg total) by mouth 2 (two) times daily. 60 tablet 0   Multiple Vitamin (MULTIVITAMIN) tablet Take 1 tablet by mouth daily.     nitroGLYCERIN (NITROSTAT) 0.4 MG SL tablet Place 1 tablet (0.4 mg total) under the tongue every 5 (five) minutes x 3 doses as needed for chest pain. 25 tablet 3   pantoprazole (PROTONIX) 40 MG tablet Take 1 tablet (40 mg total) by mouth  daily. 90 tablet 3   polyethylene glycol (MIRALAX / GLYCOLAX) 17 g packet Take 17 g by mouth 2 (two) times daily as needed for moderate constipation or mild constipation. 14 each 0   pravastatin (PRAVACHOL) 40 MG tablet Take 40 mg by mouth daily.     traMADol (ULTRAM) 50 MG tablet Take 50 mg by mouth every 6 (six) hours as needed for mild pain.     traZODone (DESYREL) 50 MG tablet Take 1-2 tablets (50-100 mg total) by mouth at bedtime as needed for sleep. 60 tablet 0   apixaban (ELIQUIS) 5 MG TABS tablet Take 2 tablets (10 mg total) by mouth 2 (two) times daily for 9 doses. Start in evening 03/28/2022 (had AM dose is hospital) 18 tablet 0   levothyroxine (SYNTHROID) 50 MCG tablet Take 50 mcg by mouth.     No current facility-administered medications on file prior to visit.    There are no Patient Instructions on file for this visit. No follow-ups on file.   Kris Hartmann, NP

## 2022-04-20 DIAGNOSIS — D6851 Activated protein C resistance: Secondary | ICD-10-CM | POA: Diagnosis not present

## 2022-04-20 DIAGNOSIS — D500 Iron deficiency anemia secondary to blood loss (chronic): Secondary | ICD-10-CM | POA: Diagnosis not present

## 2022-04-20 DIAGNOSIS — Z89512 Acquired absence of left leg below knee: Secondary | ICD-10-CM | POA: Diagnosis not present

## 2022-04-20 DIAGNOSIS — I2699 Other pulmonary embolism without acute cor pulmonale: Secondary | ICD-10-CM | POA: Diagnosis not present

## 2022-04-20 DIAGNOSIS — N184 Chronic kidney disease, stage 4 (severe): Secondary | ICD-10-CM | POA: Diagnosis not present

## 2022-04-20 DIAGNOSIS — Z86711 Personal history of pulmonary embolism: Secondary | ICD-10-CM | POA: Diagnosis not present

## 2022-04-20 DIAGNOSIS — I739 Peripheral vascular disease, unspecified: Secondary | ICD-10-CM | POA: Diagnosis not present

## 2022-04-21 ENCOUNTER — Inpatient Hospital Stay: Payer: Medicare HMO | Attending: Oncology | Admitting: Oncology

## 2022-04-21 DIAGNOSIS — Z8551 Personal history of malignant neoplasm of bladder: Secondary | ICD-10-CM | POA: Insufficient documentation

## 2022-04-21 DIAGNOSIS — Z7901 Long term (current) use of anticoagulants: Secondary | ICD-10-CM | POA: Insufficient documentation

## 2022-04-21 DIAGNOSIS — Z86718 Personal history of other venous thrombosis and embolism: Secondary | ICD-10-CM | POA: Insufficient documentation

## 2022-04-21 DIAGNOSIS — Z8546 Personal history of malignant neoplasm of prostate: Secondary | ICD-10-CM | POA: Insufficient documentation

## 2022-04-21 DIAGNOSIS — Z86711 Personal history of pulmonary embolism: Secondary | ICD-10-CM | POA: Insufficient documentation

## 2022-04-29 DIAGNOSIS — N182 Chronic kidney disease, stage 2 (mild): Secondary | ICD-10-CM | POA: Diagnosis not present

## 2022-04-29 DIAGNOSIS — I5033 Acute on chronic diastolic (congestive) heart failure: Secondary | ICD-10-CM | POA: Diagnosis not present

## 2022-04-29 DIAGNOSIS — Z4781 Encounter for orthopedic aftercare following surgical amputation: Secondary | ICD-10-CM | POA: Diagnosis not present

## 2022-04-29 DIAGNOSIS — I471 Supraventricular tachycardia: Secondary | ICD-10-CM | POA: Diagnosis not present

## 2022-04-29 DIAGNOSIS — D62 Acute posthemorrhagic anemia: Secondary | ICD-10-CM | POA: Diagnosis not present

## 2022-04-29 DIAGNOSIS — I480 Paroxysmal atrial fibrillation: Secondary | ICD-10-CM | POA: Diagnosis not present

## 2022-04-29 DIAGNOSIS — I130 Hypertensive heart and chronic kidney disease with heart failure and stage 1 through stage 4 chronic kidney disease, or unspecified chronic kidney disease: Secondary | ICD-10-CM | POA: Diagnosis not present

## 2022-05-02 ENCOUNTER — Encounter (INDEPENDENT_AMBULATORY_CARE_PROVIDER_SITE_OTHER): Payer: Self-pay | Admitting: Nurse Practitioner

## 2022-05-02 ENCOUNTER — Ambulatory Visit (INDEPENDENT_AMBULATORY_CARE_PROVIDER_SITE_OTHER): Payer: Medicare HMO | Admitting: Nurse Practitioner

## 2022-05-02 VITALS — BP 107/58 | HR 74 | Resp 16

## 2022-05-02 DIAGNOSIS — I7090 Unspecified atherosclerosis: Secondary | ICD-10-CM

## 2022-05-02 DIAGNOSIS — Z89512 Acquired absence of left leg below knee: Secondary | ICD-10-CM

## 2022-05-02 DIAGNOSIS — I824Y3 Acute embolism and thrombosis of unspecified deep veins of proximal lower extremity, bilateral: Secondary | ICD-10-CM

## 2022-05-02 MED ORDER — CYCLOBENZAPRINE HCL 5 MG PO TABS: 5.0000 mg | ORAL_TABLET | Freq: Three times a day (TID) | ORAL | 0 refills | Status: DC | PRN

## 2022-05-04 DIAGNOSIS — N393 Stress incontinence (female) (male): Secondary | ICD-10-CM | POA: Diagnosis not present

## 2022-05-06 ENCOUNTER — Other Ambulatory Visit: Payer: Self-pay | Admitting: Nurse Practitioner

## 2022-05-06 ENCOUNTER — Other Ambulatory Visit (HOSPITAL_COMMUNITY): Payer: Self-pay | Admitting: Nurse Practitioner

## 2022-05-06 DIAGNOSIS — T839XXA Unspecified complication of genitourinary prosthetic device, implant and graft, initial encounter: Secondary | ICD-10-CM

## 2022-05-13 ENCOUNTER — Ambulatory Visit
Admission: RE | Admit: 2022-05-13 | Discharge: 2022-05-13 | Disposition: A | Discharge status: Home / Self Care | Payer: Medicare HMO | Source: Ambulatory Visit | Attending: Nurse Practitioner | Admitting: Nurse Practitioner

## 2022-05-13 DIAGNOSIS — Y839 Surgical procedure, unspecified as the cause of abnormal reaction of the patient, or of later complication, without mention of misadventure at the time of the procedure: Secondary | ICD-10-CM | POA: Insufficient documentation

## 2022-05-13 DIAGNOSIS — D494 Neoplasm of unspecified behavior of bladder: Secondary | ICD-10-CM | POA: Diagnosis not present

## 2022-05-13 DIAGNOSIS — Z8551 Personal history of malignant neoplasm of bladder: Secondary | ICD-10-CM | POA: Diagnosis not present

## 2022-05-13 DIAGNOSIS — T83410A Breakdown (mechanical) of penile (implanted) prosthesis, initial encounter: Secondary | ICD-10-CM | POA: Diagnosis not present

## 2022-05-13 DIAGNOSIS — I700 Atherosclerosis of aorta: Secondary | ICD-10-CM | POA: Diagnosis not present

## 2022-05-13 DIAGNOSIS — Y738 Miscellaneous gastroenterology and urology devices associated with adverse incidents, not elsewhere classified: Secondary | ICD-10-CM | POA: Diagnosis not present

## 2022-05-13 DIAGNOSIS — T839XXA Unspecified complication of genitourinary prosthetic device, implant and graft, initial encounter: Secondary | ICD-10-CM | POA: Insufficient documentation

## 2022-05-13 MED ORDER — IOHEXOL 300 MG/ML  SOLN
100.0000 mL | Freq: Once | INTRAMUSCULAR | Status: AC | PRN
  Administered 2022-05-13: 100 mL via INTRAVENOUS

## 2022-05-15 ENCOUNTER — Encounter (INDEPENDENT_AMBULATORY_CARE_PROVIDER_SITE_OTHER): Payer: Self-pay | Admitting: Nurse Practitioner

## 2022-05-15 NOTE — Progress Notes (Signed)
Subjective:    Patient ID: Phillip [name], male    DOB: 09-12-1946, 76 y.o.   MRN: [id number] Chief Complaint  Patient presents with   Wound Check    2 weeks wound check     Phillip [name] is a 76 year old male who returns today for follow-up evaluation after multiple vascular events following a recent hospitalization.  The patient underwent bladder surgery for which she had to stop his Eliquis for several days prior.  He presented to [facility] with a ischemic left foot.  He underwent intervention on 03/11/2022 with subsequent reintervention due to restenosis on 03/13/2022.  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  Shortly following his amputation the patient developed bilateral DVTs with subsequent pulmonary embolism with pulmonary thrombectomy on 03/18/2022.  Due to the clot burden he underwent IVC placement on 03/19/2022.  The patient subsequently had a worsening clot development and underwent additional pulmonary thrombectomy on 03/23/2022.  The patient has also had hypercoagulable work-up studies done, and continues to follow-up with heme-onc.  He presents today for evaluation of his left below-knee amputation.  Today the wound is well-healed.  The patient has a good range of motion within the knee itself.  He is highly motivated to get his prosthetic.  He notes referral has been made to [place] [facility].    Review of Systems  Musculoskeletal:  Positive for gait problem.  All other systems reviewed and are negative.      Objective:   Physical Exam Vitals reviewed.  HENT:     Head: Normocephalic.  Cardiovascular:     Rate and Rhythm: Normal rate.     Pulses:          Dorsalis pedis pulses are 1+ on the right side.  Pulmonary:     Effort: Pulmonary effort is normal.  Musculoskeletal:     Left Lower Extremity: Left leg is amputated above knee.  Skin:    General:  Skin is warm and dry.  Neurological:     Mental Status: He is alert and oriented to person, place, and time.  Psychiatric:        Mood and Affect: Mood normal.        Behavior: Behavior normal.        Thought Content: Thought content normal.        Judgment: Judgment normal.     BP (!) 107/58 (BP Location: Left Arm)   Pulse 74   Resp 16   Past Medical History:  Diagnosis Date   Arthritis    Basal cell carcinoma    L clavicle, L prox forearm- removed years ago    Bladder cancer (HCC)    Bladder neck contracture    CAD (coronary artery disease)    a. 12/2020 NSTEMI/Cath: LM nl, LAD 95ost/p, LCX 50p, RCA nl. EF 45-50%.   Cataract    CKD (chronic kidney disease), stage III [facility])    ED (erectile dysfunction)    Frequency    GERD (gastroesophageal reflux disease)    History of pulmonary embolus (PE) 11/2018   a. Following LE DVT-->Chronic warfarin.   Hyperlipidemia LDL goal <70    Hypertension    Hypothyroidism    Incontinence of urine    sui, s/p cryoablation   Ischemic cardiomyopathy    a. 11/2018 Echo: EF 60-65%; b. 12/2020 LV Gram: EF 45-50% in setting of NSTEMI.   Kidney stones  Neuropathy    feet   Nocturia    Prostate cancer (HCC)    S/P   CRYOABLATION   Right elbow tendinitis    Right Lower Extremity DVT (deep venous thrombosis) (HCC) 11/25/2018   Vertigo    1-2x/yr   Wears glasses     Social History   Socioeconomic History   Marital status: Married    Spouse name: Almin Livingstone   Number of children: Not on file   Years of education: Not on file   Highest education level: Not on file  Occupational History   Not on file  Tobacco Use   Smoking status: Never   Smokeless tobacco: Never  Vaping Use   Vaping Use: Never used  Substance and Sexual Activity   Alcohol use: No   Drug use: No   Sexual activity: Not Currently    Birth control/protection: None  Other Topics Concern   Not on file  Social History Narrative   Lives locally with wife.   Exercises regularly - gym/golf.  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   Social Determinants of Health   Financial Resource Strain: Not on file  Food Insecurity: Not on file  Transportation Needs: Not on file  Physical Activity: Not on file  Stress: Not on file  Social Connections: Not on file  Intimate Partner Violence: Not on file    Past Surgical History:  Procedure Laterality Date   AMPUTATION Left 03/16/2022   Procedure: AMPUTATION BELOW KNEE;  Surgeon: Katha Cabal, MD;  Location: [facility];  Service: Vascular;  Laterality: Left;   ARTHROPLASTY AND TENDON REPAIR LEFT THUMB  JAN 2014   CATARACT EXTRACTION W/PHACO Left 11/16/2020   Procedure: CATARACT EXTRACTION PHACO AND INTRAOCULAR LENS PLACEMENT (IOC) LEFT  7.09 00:56.4 12.6%;  Surgeon: Leandrew Koyanagi, MD;  Location: [place];  Service: Ophthalmology;  Laterality: Left;   CATARACT EXTRACTION W/PHACO Right 12/02/2020   Procedure: CATARACT EXTRACTION PHACO AND INTRAOCULAR LENS PLACEMENT (IOC) RIGHT MALYUGIN 5.52 00:49.7 11.1%;  Surgeon: Leandrew Koyanagi, MD;  Location: [place];  Service: Ophthalmology;  Laterality: Right;   COLONOSCOPY     COLONOSCOPY WITH PROPOFOL N/A 08/09/2017   Procedure: COLONOSCOPY WITH PROPOFOL;  Surgeon: Manya Silvas, MD;  [facility]: [facility];  Service: Endoscopy;  Laterality: N/A;   CORONARY ANGIOGRAPHY N/A 12/28/2020   Procedure: CORONARY ANGIOGRAPHY;  Surgeon: Sherren Mocha, MD;  Location: [place] CV LAB;  Service: Cardiovascular;  Laterality: N/A;   CORONARY STENT INTERVENTION N/A 12/28/2020   Procedure: CORONARY STENT INTERVENTION;  Surgeon: Sherren Mocha, MD;  Location: [place] CV LAB;  Service: Cardiovascular;  Laterality: N/A;   ESOPHAGOGASTRODUODENOSCOPY (EGD) WITH PROPOFOL N/A 08/09/2017   Procedure: ESOPHAGOGASTRODUODENOSCOPY (EGD) WITH PROPOFOL;  Surgeon: Manya Silvas, MD;  Location: [facility] END[facility]COPY;   Service: Endoscopy;  Laterality: N/A;   GREEN LIGHT LASER TURP (TRANSURETHRAL RESECTION OF PROSTATE N/A 12/14/2015   Procedure: GREEN LIGHT LASER TURP (TRANSURETHRAL RESECTION OF PROSTATE) LASER OF BLADDER NECK CONTRACTURE;  Surgeon: Carolan Clines, MD;  Location: [place];  Service: Urology;  Laterality: N/A;   IVC FILTER INSERTION N/A 03/19/2022   Procedure: IVC FILTER INSERTION;  Surgeon: Serafina Mitchell, MD;  Location: [place] CV LAB;  Service: Cardiovascular;  Laterality: N/A;   LEFT HEART CATH AND CORONARY ANGIOGRAPHY N/A 12/24/2020   Procedure: LEFT HEART CATH AND CORONARY ANGIOGRAPHY and possible PCI and stent;  Surgeon: Yolonda Kida, MD;  Location: [place]  CV LAB;  Service: Cardiovascular;  Laterality: N/A;   LEFT HEART CATH AND CORONARY ANGIOGRAPHY N/A 01/06/2021   Procedure: LEFT HEART CATH AND CORONARY ANGIOGRAPHY;  Surgeon: Troy Sine, MD;  Location: [place] CV LAB;  Service: Cardiovascular;  Laterality: N/A;   LOWER EXTREMITY ANGIOGRAPHY Left 03/11/2022   Procedure: Lower Extremity Angiography;  Surgeon: Katha Cabal, MD;  Location: [place] CV LAB;  Service: Cardiovascular;  Laterality: Left;   PENILE PROSTHESIS IMPLANT N/A 12/06/2013   Procedure: PENILE PROTHESIS INFLATABLE;  Surgeon: Ailene Rud, MD;  Location: [facility];  Service: Urology;  Laterality: N/A;   PROSTATE CRYOABLATION  06-01-2012  [facility]   PULMONARY THROMBECTOMY N/A 03/18/2022   Procedure: PULMONARY THROMBECTOMY;  Surgeon: Algernon Huxley, MD;  Location: [place] CV LAB;  Service: Cardiovascular;  Laterality: N/A;   PULMONARY THROMBECTOMY Bilateral 03/23/2022   Procedure: PULMONARY THROMBECTOMY;  Surgeon: Katha Cabal, MD;  Location: [place] CV LAB;  Service: Cardiovascular;  Laterality: Bilateral;   THROMBECTOMY ILIAC ARTERY Left 03/12/2022   Procedure: THROMBECTOMY LEG;  Surgeon: Conrad G[place], MD;  Location: [facility];  Service:  Vascular;  Laterality: Left;   TRANSURETHRAL RESECTION OF BLADDER NECK N/A 03/20/2015   Procedure: RELEASE BLADDER NECK CONTRACTURE  WITH WOLF BUTTON ELECTRODE ;  Surgeon: Carolan Clines, MD;  Location: [place];  Service: Urology;  Laterality: N/A;   TRANSURETHRAL RESECTION OF BLADDER TUMOR N/A 12/05/2018   Procedure: TRANSURETHRAL RESECTION OF BLADDER TUMOR (TURBT)/CYSTOSCOPY/  INSTILLATION OF Rodell Perna;  Surgeon: Ceasar Mons, MD;  Location: [facility];  Service: Urology;  Laterality: N/A;   TRANSURETHRAL RESECTION OF BLADDER TUMOR N/A 01/15/2020   Procedure: TRANSURETHRAL RESECTION OF BLADDER TUMOR (TURBT)/ CYSTOSCOPY;  Surgeon: Ceasar Mons, MD;  Location: [facility];  Service: Urology;  Laterality: N/A;   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      Family History  Problem Relation Age of Onset   CAD Father 41    Allergies  Allergen Reactions   Doxazosin Other (See Comments)    Other reaction(s): Unknown       Latest Ref Rng & Units 03/27/2022    5:18 AM 03/26/2022    4:33 AM 03/25/2022    4:43 AM  CBC  WBC 4.0 - 10.5 K/uL 21.3  21.8  23.0   Hemoglobin 13.0 - 17.0 g/dL 7.5  7.8  8.0   Hematocrit 39.0 - 52.0 % 24.6  25.5  26.0   Platelets 150 - 400 K/uL 531  529  565       CMP     Component Value Date/Time   NA 134 (L) 03/28/2022 0338   NA 141 10/25/2014 2206   K 4.7 03/28/2022 0338   K 4.0 10/25/2014 2206   CL 109 03/28/2022 0338   CL 109 (H) 10/25/2014 2206   CO2 19 (L) 03/28/2022 0338   CO2 26 10/25/2014 2206   GLUCOSE 121 (H) 03/28/2022 0338   GLUCOSE 210 (H) 10/25/2014 2206   BUN 45 (H) 03/28/2022 0338   BUN 21 08/15/2016 1618   BUN 15 10/25/2014 2206   CREATININE 1.81 (H) 03/28/2022 0338   CREATININE 1.32 (H) 10/25/2014 2206   CALCIUM 8.0 (L) 03/28/2022 0338   CALCIUM 8.2 (L) 10/25/2014 2206   PROT 5.9 (L) 03/13/2022 0114   PROT 7.0 10/25/2014 2206    ALBUMIN 3.2 (L) 03/13/2022 0114   ALBUMIN 3.2 (L) 10/25/2014 2206  AST 23 03/13/2022 0114   AST 20 10/25/2014 2206   ALT 19 03/13/2022 0114   ALT 22 10/25/2014 2206   ALKPHOS 43 03/13/2022 0114   ALKPHOS 58 10/25/2014 2206   BILITOT 0.7 03/13/2022 0114   BILITOT 0.2 10/25/2014 2206   GFRNONAA 39 (L) 03/28/2022 0338   GFRNONAA 58 (L) 10/25/2014 2206   GFRAA >60 03/27/2019 0637   GFRAA >60 10/25/2014 2206     No results found.     Assessment & Plan:   1. Hx of BKA, left ([place]) The patient's left below-knee amputation is well-healed and patient is ready to progress to a prosthetic.  Patient has already received referral to [facility] clinic. 2. Deep vein thrombosis (DVT) of proximal vein of both lower extremities, unspecified chronicity (HCC) Previous studies indicate continued thrombus.  He will continue with Eliquis.  We will likely plan on having patient remain on Eliquis for at least 6 months possibly longer depending on his hypercoagulable studies.  3. Arterial occlusion The patient's right lower extremity had triphasic waveforms noted in his DVT studies.  He also had normal ABIs.  The patient is concerned about some of the perfusion due to the cold sensation we will check ABIs when he returns, however the patient does have notable palpable pulses.   Current Outpatient Medications on File Prior to Visit  Medication Sig Dispense Refill   acetaminophen (TYLENOL) 500 MG tablet Take 500 mg by mouth every 6 (six) hours as needed.     apixaban (ELIQUIS) 5 MG TABS tablet Take 1 tablet (5 mg total) by mouth 2 (two) times daily. 60 tablet 0   ascorbic acid (VITAMIN C) 500 MG tablet Take 500 mg by mouth daily.     cetirizine (ZYRTEC) 10 MG tablet Take 10 mg by mouth daily.     clopidogrel (PLAVIX) 75 MG tablet Take 1 tablet (75 mg total) by mouth daily with breakfast. 90 tablet 3   Glucosamine-Chondroit-Vit C-Mn (GLUCOSAMINE CHONDR 1500 COMPLX PO) Take 1 tablet by mouth every evening.      melatonin 5 MG TABS Take 1 tablet (5 mg total) by mouth at bedtime. 30 tablet 0   metoprolol tartrate (LOPRESSOR) 25 MG tablet Take 0.5 tablets (12.5 mg total) by mouth 2 (two) times daily. 60 tablet 0   Multiple Vitamin (MULTIVITAMIN) tablet Take 1 tablet by mouth daily.     nitroGLYCERIN (NITROSTAT) 0.4 MG SL tablet Place 1 tablet (0.4 mg total) under the tongue every 5 (five) minutes x 3 doses as needed for chest pain. 25 tablet 3   pantoprazole (PROTONIX) 40 MG tablet Take 1 tablet (40 mg total) by mouth daily. 90 tablet 3   polyethylene glycol (MIRALAX / GLYCOLAX) 17 g packet Take 17 g by mouth 2 (two) times daily as needed for moderate constipation or mild constipation. 14 each 0   pravastatin (PRAVACHOL) 40 MG tablet Take 40 mg by mouth daily.     traMADol (ULTRAM) 50 MG tablet Take 50 mg by mouth every 6 (six) hours as needed for mild pain.     traZODone (DESYREL) 50 MG tablet Take 1-2 tablets (50-100 mg total) by mouth at bedtime as needed for sleep. 60 tablet 0   apixaban (ELIQUIS) 5 MG TABS tablet Take 2 tablets (10 mg total) by mouth 2 (two) times daily for 9 doses. Start in evening 03/28/2022 (had AM dose is hospital) 18 tablet 0   levothyroxine (SYNTHROID) 50 MCG tablet Take 50 mcg by mouth.  No current facility-administered medications on file prior to visit.    There are no Patient Instructions on file for this visit. No follow-ups on file.   Kris Hartmann, NP

## 2022-05-19 ENCOUNTER — Telehealth: Payer: Self-pay

## 2022-05-19 ENCOUNTER — Inpatient Hospital Stay (HOSPITAL_BASED_OUTPATIENT_CLINIC_OR_DEPARTMENT_OTHER): Payer: Medicare HMO | Admitting: Oncology

## 2022-05-19 ENCOUNTER — Encounter: Payer: Self-pay | Admitting: Oncology

## 2022-05-19 VITALS — BP 128/75 | HR 71 | Temp 98.2°F | Resp 18 | Wt 159.0 lb

## 2022-05-19 DIAGNOSIS — Z86711 Personal history of pulmonary embolism: Secondary | ICD-10-CM | POA: Diagnosis not present

## 2022-05-19 DIAGNOSIS — D471 Chronic myeloproliferative disease: Secondary | ICD-10-CM | POA: Insufficient documentation

## 2022-05-19 DIAGNOSIS — E538 Deficiency of other specified B group vitamins: Secondary | ICD-10-CM | POA: Diagnosis not present

## 2022-05-19 DIAGNOSIS — I82409 Acute embolism and thrombosis of unspecified deep veins of unspecified lower extremity: Secondary | ICD-10-CM

## 2022-05-19 DIAGNOSIS — Z8546 Personal history of malignant neoplasm of prostate: Secondary | ICD-10-CM | POA: Diagnosis not present

## 2022-05-19 DIAGNOSIS — Z1589 Genetic susceptibility to other disease: Secondary | ICD-10-CM

## 2022-05-19 DIAGNOSIS — D473 Essential (hemorrhagic) thrombocythemia: Secondary | ICD-10-CM | POA: Insufficient documentation

## 2022-05-19 DIAGNOSIS — Z7901 Long term (current) use of anticoagulants: Secondary | ICD-10-CM | POA: Diagnosis not present

## 2022-05-19 DIAGNOSIS — Z8551 Personal history of malignant neoplasm of bladder: Secondary | ICD-10-CM

## 2022-05-19 DIAGNOSIS — D6859 Other primary thrombophilia: Secondary | ICD-10-CM | POA: Diagnosis not present

## 2022-05-19 DIAGNOSIS — Z86718 Personal history of other venous thrombosis and embolism: Secondary | ICD-10-CM | POA: Diagnosis not present

## 2022-05-19 DIAGNOSIS — E1169 Type 2 diabetes mellitus with other specified complication: Secondary | ICD-10-CM | POA: Diagnosis not present

## 2022-05-19 DIAGNOSIS — E782 Mixed hyperlipidemia: Secondary | ICD-10-CM | POA: Diagnosis not present

## 2022-05-19 DIAGNOSIS — Z125 Encounter for screening for malignant neoplasm of prostate: Secondary | ICD-10-CM | POA: Diagnosis not present

## 2022-05-19 NOTE — Assessment & Plan Note (Signed)
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

## 2022-05-19 NOTE — Assessment & Plan Note (Addendum)
Recommend work-up for myeloproliferative disease. Obtain bone marrow biopsy.  He agrees with the plan.

## 2022-05-19 NOTE — Progress Notes (Signed)
Hematology/Oncology Progress note [phone] [phone]            Patient Care Team: Rusty Aus, MD as PCP - General (Internal Medicine) Yolonda Kida, MD as PCP - Cardiology (Cardiology)  REFERRING PROVIDER: Rusty Aus, MD   CHIEF COMPLAINTS/REASON FOR VISIT:  Post hospitalization follow up  Pertinent hematology oncology history   Patient has significant history of previous DVT and PE.   Patient was seen by me in March 2020 and declined further follow-up. 2013 history of prostate cancer treated with cryo therapy  01/03/2016 history of superficial venous thrombosis demonstrated in the greater saphenous vein-.  Also history of superficial thrombophlebitis of segment  of left the basal leg vein in the upper arm-08/24/2017.  No anticoagulation was recommended at that time due to the SVT features. 11/25/2018, right lower extremity acute DVT, started on Lovenox for about a week, 12/05/2018, patient underwent TURBT and intravesical instillation of gemcitabine for noninvasive low-grade papillary urothelial carcinoma.  Lovenox was held for 2 days prior and for 24 hours after procedure, and also started on Eliquis 12/15/2018, developed acute nonocclusive segmental and subsegmental pulmonary emboli within the right upper lobe, right middle lobe, right lower lobe pulmonary branches.  Patient was admitted to the hospital and started on heparin drip.  Patient was seen by me during his admission at that time.  Lovenox 1 mg/kg twice daily was recommended at discharge.  He declined further follow-up with me in the clinic.   His anticoagulation was managed by primary care provider Dr. Sabra Heck.  Patient was on Coumadin for anticoagulation until Feb 2023.  Patient did not check INR since September 2022.  Patient also developed supratherapeutic INR due to interaction between Coumadin and Diflucan for treatment of esophageal candidiasis.  Patient was off Coumadin on 11/17/2021.   11/21/2021 developed acute nonocclusive DVT, started on Lovenox decision was made to switch to Eliquis.   03/09/2022, artificial urinary sphincter placement.  His Eliquis was held for 1 week prior to the procedure.  He has noticed left lower extremity pain 1 to 2 days after holding Eliquis. 03/11/2022, ER visit due to progressively worsening left lower extremity pain. CT angio aortobifemoral with and without contrast showed eccentric thrombus in the left outflow vessels with distal occlusion of left renal vessels.  Delayed filling of the right popliteal artery is concerning for underlying occlusions and thromboembolic disease in the right runoff vessels Started on heparin drip in ED. 03/11/2022, vascular surgeon lower extremity bypass surgery 03/12/2022, severe left lower extremity pain, return to the OR for left lower extremity thrombectomy.  Continued on heparin drip. 03/16/2022, left lower extremity extremity nonsalvageable.  Status post BKA.  Per my discussion with pharmacy, heparin was held around 9 AM on 03/16/2022 and resumed on 03/17/2022 9:30 PM. . 03/17/2022, acute respiratory failure, CT chest angiogram showed bilateral central pulm emboli involving the main pulmonary arteries and extending into all lobar branches with nearly occlusive clot to the right lower lobe, left upper lobe, several segmental branches.  CT evidence of right heart strain.Mildly increased sclerotic focus    03/18/2022, thrombolysis, mechanical thrombectomy.  Heparin gtt restarted after procedure. 03/19/2022, heparin gtt was continued. 03/19/2022, bilateral lower extremity ultrasound showed thrombosis of right femoral popliteal and calf veins.  Partially thrombosed left profunda femoris vein. 03/19/2022 IVC filter placement. 03/20/2022, still have significant hypoxia.  On heparin gtt. 03/21/2022 -03/22/2022, on  heparin gtt. 03/23/2022, CT angio chest.showed increased clot burden within right pulmonary artery and the right  lower lobe  pulmonary emboli with similar clot burden in the right middle lobe, right upper lobe, left lower lobe.  Single residual fibrin strand/clot at the bifurcation of the pulmonary artery with decreased clot burden in the left upper lobe artery branches.  Persistent right heart strain.  Findings suspicious for developing pulmonary infarcts. 03/23/2022, mechanical embolectomy of pulmonary arteries as well as right external iliac vein, common iliac vein and inferior vena cava.   Given that patient developed recurrent PE/DVT despite being therapeutic on heparin GTT, post thrombectomy, patient was switched to Angiomax and later started on Argatroban drip  Patient had  hypercoagulable work-up done during the admission. Work-up showed negative factor V Leiden mutation, prothrombin gene imaging.  Negative anticardiolipin antibodies, lupus anticoagulants.  Negative beta-2 glycoprotein antibodies Positive JAK2 V6 45F mutation   Patient was discharged on Eliquis.  He reports feeling well.  He denies shortness of breath, leg swelling and tenderness.  Left lower extremity BKA wound heals well.   MEDICAL HISTORY:  Past Medical History:  Diagnosis Date   Arthritis    Basal cell carcinoma    L clavicle, L prox forearm- removed years ago    Bladder cancer (HCC)    Bladder neck contracture    CAD (coronary artery disease)    a. 12/2020 NSTEMI/Cath: LM nl, LAD 95ost/p, LCX 50p, RCA nl. EF 45-50%.   Cataract    CKD (chronic kidney disease), stage III [facility])    ED (erectile dysfunction)    Frequency    GERD (gastroesophageal reflux disease)    History of pulmonary embolus (PE) 11/2018   a. Following LE DVT-->Chronic warfarin.   Hyperlipidemia LDL goal <70    Hypertension    Hypothyroidism    Incontinence of urine    sui, s/p cryoablation   Ischemic cardiomyopathy    a. 11/2018 Echo: EF 60-65%; b. 12/2020 LV Gram: EF 45-50% in setting of NSTEMI.   Kidney stones    Neuropathy    feet   Nocturia    Prostate  cancer ([place])    S/P   CRYOABLATION   Right elbow tendinitis    Right Lower Extremity DVT (deep venous thrombosis) ([place]) 11/25/2018   Vertigo    1-2x/yr   Wears glasses     SURGICAL HISTORY: Past Surgical History:  Procedure Laterality Date   AMPUTATION Left 03/16/2022   Procedure: AMPUTATION BELOW KNEE;  Surgeon: Katha Cabal, MD;  Location: [facility];  Service: Vascular;  Laterality: Left;   ARTHROPLASTY AND TENDON REPAIR LEFT THUMB  JAN 2014   CATARACT EXTRACTION W/PHACO Left 11/16/2020   Procedure: CATARACT EXTRACTION PHACO AND INTRAOCULAR LENS PLACEMENT (IOC) LEFT  7.09 00:56.4 12.6%;  Surgeon: Leandrew Koyanagi, MD;  Location: [place];  Service: Ophthalmology;  Laterality: Left;   CATARACT EXTRACTION W/PHACO Right 12/02/2020   Procedure: CATARACT EXTRACTION PHACO AND INTRAOCULAR LENS PLACEMENT (IOC) RIGHT MALYUGIN 5.52 00:49.7 11.1%;  Surgeon: Leandrew Koyanagi, MD;  Location: [place];  Service: Ophthalmology;  Laterality: Right;   COLONOSCOPY     COLONOSCOPY WITH PROPOFOL N/A 08/09/2017   Procedure: COLONOSCOPY WITH PROPOFOL;  Surgeon: Manya Silvas, MD;  Location: [facility];  Service: Endoscopy;  Laterality: N/A;   CORONARY ANGIOGRAPHY N/A 12/28/2020   Procedure: CORONARY ANGIOGRAPHY;  Surgeon: Sherren Mocha, MD;  Location: [place] [facility];  Service: Cardiovascular;  Laterality: N/A;   CORONARY STENT INTERVENTION N/A 12/28/2020   Procedure: CORONARY STENT INTERVENTION;  Surgeon: Sherren Mocha, MD;  Location: [place] [facility];  Service: Cardiovascular;  Laterality: N/A;   ESOPHAGOGASTRODUODENOSCOPY (EGD) WITH PROPOFOL N/A 08/09/2017   Procedure: ESOPHAGOGASTRODUODENOSCOPY (EGD) WITH PROPOFOL;  Surgeon: Manya Silvas, MD;  Location: [facility] ENDOSCOPY;  Service: Endoscopy;  Laterality: N/A;   GREEN LIGHT LASER TURP (TRANSURETHRAL RESECTION OF PROSTATE N/A 12/14/2015   Procedure: GREEN LIGHT LASER TURP (TRANSURETHRAL RESECTION OF  PROSTATE) LASER OF BLADDER NECK CONTRACTURE;  Surgeon: Carolan Clines, MD;  Location: [place];  Service: Urology;  Laterality: N/A;   IVC FILTER INSERTION N/A 03/19/2022   Procedure: IVC FILTER INSERTION;  Surgeon: Serafina Mitchell, MD;  Location: [place] [facility];  Service: Cardiovascular;  Laterality: N/A;   LEFT HEART CATH AND CORONARY ANGIOGRAPHY N/A 12/24/2020   Procedure: LEFT HEART CATH AND CORONARY ANGIOGRAPHY and possible PCI and stent;  Surgeon: Yolonda Kida, MD;  Location: [place] [facility];  Service: Cardiovascular;  Laterality: N/A;   LEFT HEART CATH AND CORONARY ANGIOGRAPHY N/A 01/06/2021   Procedure: LEFT HEART CATH AND CORONARY ANGIOGRAPHY;  Surgeon: Troy Sine, MD;  Location: [place] [facility];  Service: Cardiovascular;  Laterality: N/A;   LOWER EXTREMITY ANGIOGRAPHY Left 03/11/2022   Procedure: Lower Extremity Angiography;  Surgeon: Katha Cabal, MD;  Location: [place] [facility];  Service: Cardiovascular;  Laterality: Left;   PENILE PROSTHESIS IMPLANT N/A 12/06/2013   Procedure: PENILE PROTHESIS INFLATABLE;  Surgeon: Ailene Rud, MD;  Location: [facility];  Service: Urology;  Laterality: N/A;   PROSTATE CRYOABLATION  06-01-2012  [facility]   PULMONARY THROMBECTOMY N/A 03/18/2022   Procedure: PULMONARY THROMBECTOMY;  Surgeon: Algernon Huxley, MD;  Location: [place] [facility];  Service: Cardiovascular;  Laterality: N/A;   PULMONARY THROMBECTOMY Bilateral 03/23/2022   Procedure: PULMONARY THROMBECTOMY;  Surgeon: Katha Cabal, MD;  Location: [place] [facility];  Service: Cardiovascular;  Laterality: Bilateral;   THROMBECTOMY ILIAC ARTERY Left 03/12/2022   Procedure: THROMBECTOMY LEG;  Surgeon: Conrad Pinehurst, MD;  Location: [facility];  Service: Vascular;  Laterality: Left;   TRANSURETHRAL RESECTION OF BLADDER NECK N/A 03/20/2015   Procedure: RELEASE BLADDER NECK CONTRACTURE  WITH WOLF BUTTON ELECTRODE ;  Surgeon: Carolan Clines, MD;  Location: [place];  Service: Urology;  Laterality: N/A;   TRANSURETHRAL RESECTION OF BLADDER TUMOR N/A 12/05/2018   Procedure: TRANSURETHRAL RESECTION OF BLADDER TUMOR (TURBT)/CYSTOSCOPY/  INSTILLATION OF Rodell Perna;  Surgeon: Ceasar Mons, MD;  Location: [facility];  Service: Urology;  Laterality: N/A;   TRANSURETHRAL RESECTION OF BLADDER TUMOR N/A 01/15/2020   Procedure: TRANSURETHRAL RESECTION OF BLADDER TUMOR (TURBT)/ CYSTOSCOPY;  Surgeon: Ceasar Mons, MD;  Location: [facility];  Service: Urology;  Laterality: N/A;   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      SOCIAL HISTORY: Social History   Socioeconomic History   Marital status: Married    Spouse name: Donevin Sainsbury   Number of children: Not on file   Years of education: Not on file   Highest education level: Not on file  Occupational History   Not on file  Tobacco Use   Smoking status: Never   Smokeless tobacco: Never  Vaping Use   Vaping Use: Never used  Substance and Sexual Activity   Alcohol use: No   Drug use: No   Sexual activity: Not Currently    Birth control/protection: None  Other Topics Concern   Not on file  Social History Narrative   Lives locally  with wife.  Exercises regularly - gym/golf.  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   Social Determinants of Health   Financial Resource Strain: Not on file  Food Insecurity: Not on file  Transportation Needs: Not on file  Physical Activity: Not on file  Stress: Not on file  Social Connections: Not on file  Intimate Partner Violence: Not on file    FAMILY HISTORY: Family History  Problem Relation Age of Onset   CAD Father 42    ALLERGIES:  is allergic to doxazosin.  MEDICATIONS:  Current Outpatient Medications  Medication Sig Dispense Refill   acetaminophen (TYLENOL) 500 MG tablet Take 500 mg by  mouth every 6 (six) hours as needed.     apixaban (ELIQUIS) 5 MG TABS tablet Take 2 tablets (10 mg total) by mouth 2 (two) times daily for 9 doses. Start in evening 03/28/2022 (had AM dose is hospital) 18 tablet 0   ascorbic acid (VITAMIN C) 500 MG tablet Take 500 mg by mouth daily.     cetirizine (ZYRTEC) 10 MG tablet Take 10 mg by mouth daily.     clopidogrel (PLAVIX) 75 MG tablet Take 1 tablet (75 mg total) by mouth daily with breakfast. 90 tablet 3   co-enzyme Q-10 30 MG capsule Take 30 mg by mouth daily.     levothyroxine (SYNTHROID) 50 MCG tablet Take 50 mcg by mouth.     melatonin 5 MG TABS Take 1 tablet (5 mg total) by mouth at bedtime. 30 tablet 0   metoprolol tartrate (LOPRESSOR) 25 MG tablet Take 0.5 tablets (12.5 mg total) by mouth 2 (two) times daily. 60 tablet 0   Multiple Vitamin (MULTIVITAMIN) tablet Take 1 tablet by mouth daily.     pantoprazole (PROTONIX) 40 MG tablet Take 1 tablet (40 mg total) by mouth daily. 90 tablet 3   pravastatin (PRAVACHOL) 40 MG tablet Take 40 mg by mouth daily.     Turmeric (QC TUMERIC COMPLEX PO) Take by mouth.     apixaban (ELIQUIS) 5 MG TABS tablet Take 1 tablet (5 mg total) by mouth 2 (two) times daily. 60 tablet 0   cyclobenzaprine (FLEXERIL) 5 MG tablet Take 1 tablet (5 mg total) by mouth 3 (three) times daily as needed for muscle spasms. (Patient not taking: Reported on 05/19/2022) 30 tablet 0   Glucosamine-Chondroit-Vit C-Mn (GLUCOSAMINE CHONDR 1500 COMPLX PO) Take 1 tablet by mouth every evening. (Patient not taking: Reported on 05/19/2022)     nitroGLYCERIN (NITROSTAT) 0.4 MG SL tablet Place 1 tablet (0.4 mg total) under the tongue every 5 (five) minutes x 3 doses as needed for chest pain. (Patient not taking: Reported on 05/19/2022) 25 tablet 3   polyethylene glycol (MIRALAX / GLYCOLAX) 17 g packet Take 17 g by mouth 2 (two) times daily as needed for moderate constipation or mild constipation. (Patient not taking: Reported on 05/19/2022) 14 each 0    traMADol (ULTRAM) 50 MG tablet Take 50 mg by mouth every 6 (six) hours as needed for mild pain. (Patient not taking: Reported on 05/19/2022)     traZODone (DESYREL) 50 MG tablet Take 1-2 tablets (50-100 mg total) by mouth at bedtime as needed for sleep. (Patient not taking: Reported on 05/19/2022) 60 tablet 0   No current facility-administered medications for this visit.    Review of Systems  Constitutional:  Negative for appetite change, chills, fatigue, fever and unexpected weight change.  HENT:   Negative for hearing loss and  voice change.   Eyes:  Negative for eye problems and icterus.  Respiratory:  Negative for chest tightness, cough and shortness of breath.   Cardiovascular:  Negative for chest pain and leg swelling.  Gastrointestinal:  Negative for abdominal distention and abdominal pain.  Endocrine: Negative for hot flashes.  Genitourinary:  Negative for difficulty urinating, dysuria and frequency.   Musculoskeletal:  Negative for arthralgias.       Left BKA  Skin:  Negative for itching and rash.  Neurological:  Negative for light-headedness and numbness.  Hematological:  Negative for adenopathy. Does not bruise/bleed easily.  Psychiatric/Behavioral:  Negative for confusion.    PHYSICAL EXAMINATION:  Vitals:   05/19/22 1056  BP: 128/75  Pulse: 71  Resp: 18  Temp: 98.2 F (36.8 C)  SpO2: 98%   Filed Weights   05/19/22 1056  Weight: 159 lb (72.1 kg)    Physical Exam Constitutional:      General: He is not in acute distress. HENT:     Head: Normocephalic and atraumatic.  Eyes:     General: No scleral icterus. Cardiovascular:     Rate and Rhythm: Normal rate and regular rhythm.     Heart sounds: Normal heart sounds.  Pulmonary:     Effort: Pulmonary effort is normal. No respiratory distress.     Breath sounds: No wheezing.  Abdominal:     General: Bowel sounds are normal. There is no distension.     Palpations: Abdomen is soft.  Musculoskeletal:         General: Normal range of motion.     Cervical back: Normal range of motion and neck supple.     Comments: Status post left BKA  Skin:    General: Skin is warm and dry.     Findings: No erythema or rash.  Neurological:     Mental Status: He is alert and oriented to person, place, and time. Mental status is at baseline.     Cranial Nerves: No cranial nerve deficit.     Coordination: Coordination normal.  Psychiatric:        Mood and Affect: Mood normal.     LABORATORY DATA:  I have reviewed the data as listed    Latest Ref Rng & Units 03/27/2022    5:18 AM 03/26/2022    4:33 AM 03/25/2022    4:43 AM  CBC  WBC 4.0 - 10.5 K/uL 21.3  21.8  23.0   Hemoglobin 13.0 - 17.0 g/dL 7.5  7.8  8.0   Hematocrit 39.0 - 52.0 % 24.6  25.5  26.0   Platelets 150 - 400 K/uL 531  529  565       Latest Ref Rng & Units 03/28/2022    3:38 AM 03/27/2022    5:18 AM 03/26/2022    4:33 AM  CMP  Glucose 70 - 99 mg/dL 121  146  142   BUN 8 - 23 mg/dL 45  47  42   Creatinine 0.61 - 1.24 mg/dL 1.81  1.89  1.73   Sodium 135 - 145 mmol/L 134  132  132   Potassium 3.5 - 5.1 mmol/L 4.7  4.6  4.8   Chloride 98 - 111 mmol/L 109  107  106   CO2 22 - 32 mmol/L 19  18  20    Calcium 8.9 - 10.3 mg/dL 8.0  7.9  8.0       RADIOGRAPHIC STUDIES: I have personally reviewed the radiological images as listed and agreed  with the findings in the report. CT PELVIS W CONTRAST  Result Date: 05/15/2022 CLINICAL DATA:  Malfunctioning penile prosthesis. EXAM: CT PELVIS WITH CONTRAST TECHNIQUE: Multidetector CT imaging of the pelvis was performed using the standard protocol following the bolus administration of intravenous contrast. RADIATION DOSE REDUCTION: This exam was performed according to the departmental dose-optimization program which includes automated exposure control, adjustment of the mA and/or kV according to patient size and/or use of iterative reconstruction technique. CONTRAST:  123m OMNIPAQUE IOHEXOL 300 MG/ML  SOLN  COMPARISON:  Feb 13, 2022. FINDINGS: Urinary Tract:  No abnormality visualized. Bowel:  Unremarkable visualized pelvic bowel loops. Vascular/Lymphatic: Atherosclerosis of visualized abdominal aorta is noted. No definite adenopathy is noted. Reproductive: 2 penile prosthesis reservoirs are noted anteriorly in the pelvis and appear grossly intact. Patient has reportedly undergone transurethral resection of bladder tumor. Other:  No hernia or ascites is noted. Musculoskeletal: No suspicious bone lesions identified. IMPRESSION: Two penile prosthesis reservoirs are noted anteriorly in the pelvis and appear grossly intact. No acute abnormality seen in the abdomen or pelvis. Electronically Signed   By: JMarijo ConceptionM.D.   On: 05/15/2022 10:10   VAS [place]OWER EXTREMITY VENOUS (DVT)  Result Date: 04/07/2022  Lower Venous DVT Study Patient Name:  Phillip [name] Date of Exam:   04/06/2022 Medical Rec #: [id number]        Accession #:    [id number] of Birth: 2June 10, 1947        Patient Gender: M Patient Age:   89years Exam Location:  [facility] Procedure:      VAS [place]OWER EXTREMITY VENOUS (DVT) Referring Phys: GHortencia Pilar--------------------------------------------------------------------------------  Indications: Hx of DVT and PE.  Risk Factors: Surgery 03/11/2022: Lt Arterial Thrombectomy/Stent. 03/13/2022: Left Fem/Popliteal and Tibial Thrombectomy. 03/16/2022: Lt BKA. 03/19/2022: IVC Filter placement. 03/23/2022: Pulmonary Thrombectomy. Comparison Study: 03/19/2022 Performing Technologist: SAlmira CoasterRVS  Examination Guidelines: A complete evaluation includes B-mode imaging, spectral Doppler, color Doppler, and power Doppler as needed of all accessible portions of each vessel. Bilateral testing is considered an integral part of a complete examination. Limited examinations for reoccurring indications may be performed as noted. The reflux portion of the exam is performed with the patient  in reverse Trendelenburg.  +---------+---------------+---------+-----------+----------+--------------+ RIGHT    CompressibilityPhasicitySpontaneityPropertiesThrombus Aging +---------+---------------+---------+-----------+----------+--------------+ CFV      None           No       No                                  +---------+---------------+---------+-----------+----------+--------------+ SFJ      Full           Yes      Yes                                 +---------+---------------+---------+-----------+----------+--------------+ FV Prox  None           No       No                                  +---------+---------------+---------+-----------+----------+--------------+ FV Mid   None           No       No                                  +---------+---------------+---------+-----------+----------+--------------+  FV DistalNone           No       No                                  +---------+---------------+---------+-----------+----------+--------------+ PFV      None           No       No                                  +---------+---------------+---------+-----------+----------+--------------+ POP      None           No       No                                  +---------+---------------+---------+-----------+----------+--------------+ PTV      Full           No       No                                  +---------+---------------+---------+-----------+----------+--------------+ PERO     None           No       No                                  +---------+---------------+---------+-----------+----------+--------------+ GSV      Full           Yes      Yes                                 +---------+---------------+---------+-----------+----------+--------------+   +---------+---------------+---------+-----------+----------+--------------+ LEFT     CompressibilityPhasicitySpontaneityPropertiesThrombus Aging  +---------+---------------+---------+-----------+----------+--------------+ CFV      None           No       No                                  +---------+---------------+---------+-----------+----------+--------------+ SFJ      Full           Yes      Yes                                 +---------+---------------+---------+-----------+----------+--------------+ FV Prox  None           No       No                                  +---------+---------------+---------+-----------+----------+--------------+ FV Mid   None           No       No                                  +---------+---------------+---------+-----------+----------+--------------+ FV DistalNone           No  No                                  +---------+---------------+---------+-----------+----------+--------------+ PFV      None           No       No                                  +---------+---------------+---------+-----------+----------+--------------+ Lt BKA.    Summary: RIGHT: - Findings consistent with acute deep vein thrombosis involving the right common femoral vein, right femoral vein, right proximal profunda vein, right popliteal vein, and right peroneal veins. - Incidental Note: Triphasic PTA, ATA and Peroneal Artery flow seen.  LEFT: - Findings consistent with acute deep vein thrombosis involving the left common femoral vein, left femoral vein, and left proximal profunda vein.  *See table(s) above for measurements and observations. Electronically signed by [name] MD on 04/07/2022 at 5:02:36 PM.    Final    PERIPHERAL VASCULAR CATHETERIZATION  Result Date: 03/23/2022 See surgical note for result.  CT Angio Chest Pulmonary Embolism (PE) W or WO Contrast  Result Date: 03/23/2022 CLINICAL DATA:  pulmonary embolism, eval for possible repeat thrombectomy EXAM: CT ANGIOGRAPHY CHEST WITH CONTRAST TECHNIQUE: Multidetector CT imaging of the chest was performed using the standard  protocol during bolus administration of intravenous contrast. Multiplanar CT image reconstructions and MIPs were obtained to evaluate the vascular anatomy. RADIATION DOSE REDUCTION: This exam was performed according to the departmental dose-optimization program which includes automated exposure control, adjustment of the mA and/or kV according to patient size and/or use of iterative reconstruction technique. CONTRAST:  82m OMNIPAQUE IOHEXOL 350 MG/ML SOLN COMPARISON:  CTA 03/17/2022, CTA 12/07/2018. FINDINGS: Cardiovascular: Satisfactory opacification of the pulmonary arteries. There is increased embolic burden within the right main and lower lobe pulmonary arteries. There are additional emboli in the right upper and middle lobar and segmental branches. Additional pulmonary emboli in the left lower lobar, segmental and subsegmental pulmonary arteries as well as segmental and subsegmental branches of the left upper lobe. There is a thin residual strand of clot at the bifurcation of the main pulmonary artery into the right and left pulmonary arteries with overall decreased clot burden in the left upper lobe arteries. There is persistent evidence of right heart strain with RV: LV ratio measuring up to 1.6, and reflux of contrast into the IVC. Mediastinum/Nodes: No lymphadenopathy. Lungs/Pleura: There are increased peripheral ground-glass opacities in the upper lobes and superior segment of the right lower lobe. Bibasilar hypoventilatory changes. No pleural effusion. No pneumothorax. Upper Abdomen: No acute findings. Musculoskeletal: No acute osseous abnormality. There is a single 8 mm sclerotic lesion within the T4 vertebral body which is most likely a bone island, present since at least March 2020. Review of the MIP images confirms the above findings. IMPRESSION: Increased clot burden within the right pulmonary artery and right lower lobe pulmonary emboli with similar clot burden in the right middle lobe, right upper  lobe, and left lower lobe. Thin residual fiber strand/clot at the bifurcation of the pulmonary artery with decreased clot burden in the left upper lobe arterial branches. Persistent right heart strain with RV:LV ratio of 1.6 and reflux of contrast into the IVC. Findings are consistent with at least submassive (intermediate risk) PE. The presence of right heart strain has been associated with an increased risk  of morbidity and mortality. Please refer to the "Code PE Focused" order set in [facility]. Increased peripheral ground-glass opacities in the upper lobes and superior segment of the right lower lobe, suspicious for developing pulmonary infarcts. Critical Value/emergent results were called by telephone at the time of interpretation on 03/23/2022 at 11:59 am to provider Emeterio Reeve , who verbally acknowledged these results. Electronically Signed   By: Maurine Simmering M.D.   On: 03/23/2022 12:02   PERIPHERAL VASCULAR CATHETERIZATION  Result Date: 03/21/2022    Patient name: Phillip [name]        MRN: [id number]        DOB: Dec 31, 1945          Sex: male  03/19/2022 Pre-operative Diagnosis: DVT Post-operative diagnosis:  Same Surgeon:  Annamarie Major Procedure Performed:            1.  U/s guided access, right femoral vein            2.  IVC venogram            3.  Placement of IVC filter    Indications: This is a 76 year old gentleman who underwent PE thrombectomy yesterday.  He had an acute pulmonary decompensation today while on anticoagulation.  The hospital team is worried about recurrent PE in the setting of anticoagulation and would like an IVC filter placed.  Informed consent was obtained via the son and the patient.  Procedure:  The patient was identified in the holding area and taken to the specials procedure room.  A timeout was called.  Ultrasound was used to evaluate the right common femoral vein which was widely patent and easily compressible.  1% lidocaine was used for local anesthesia.  The right common  femoral vein was then cannulated under ultrasound guidance with a micropuncture needle.  A 018 wire was advanced without resistance followed placement of micropuncture sheath.  Next, a 035 wire was inserted and a 5 French sheath was placed.  A pigtail catheter was positioned in the right common iliac vein and a IVC venogram was performed which identified the location of the renal veins.  The IVC was of normal diameter without any anatomic variance.  A Amplatz wire was then inserted.  The filter introducing sheath was then placed up to the level of the renal veins.  A argon filter was then loaded into the sheath and then successfully deployed landing at the level of the renal veins.  The filter was in good position without any tilt.  The sheath was then removed and manual pressure was held for hemostasis.  There were no immediate complications.    Impression:             #1  Successful placement of removable infrarenal IVC filter             #2  Normal venacavogram   V. 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 The superficial great saphenous vein was also interrogated. Spectral Doppler was utilized to evaluate flow at rest and with distal augmentation maneuvers in the common femoral, femoral and popliteal veins.  COMPARISON:  11/21/2021 FINDINGS: RIGHT LOWER EXTREMITY Common Femoral Vein: No evidence of thrombus. Normal compressibility, respiratory phasicity and response to augmentation. Saphenofemoral Junction: No evidence of thrombus. Normal compressibility and flow on color Doppler imaging. Profunda Femoral Vein: No evidence of thrombus. Normal compressibility and flow on color Doppler imaging. Femoral Vein: Diffuse thrombosis. Popliteal Vein: Diffusely thrombosed. Calf Veins: Posterior tibial, peroneal, and gastrocnemius veins are thrombosed. Venous Reflux:  None. Other Findings:  None. LEFT LOWER EXTREMITY Common Femoral Vein: No evidence of thrombus. Normal compressibility, respiratory phasicity and response to augmentation. Saphenofemoral Junction: No evidence of thrombus. Normal compressibility and flow on color Doppler imaging. Profunda Femoral Vein: Partially thrombosed. Femoral Vein: No evidence of thrombus. Normal compressibility, respiratory phasicity and response to augmentation. Popliteal Vein: No evidence of thrombus. Normal compressibility, respiratory phasicity and response to augmentation. Other Findings:  None. IMPRESSION: 1. Thrombosis of the right femoral, popliteal, and calf veins. 2. Partially thrombosed left Profunda femoris vein. Electronically Signed   By: Miachel Roux M.D.   On: 03/19/2022 16:20   DG Chest Port 1 View  Result Date: 03/19/2022 CLINICAL DATA:  Shortness of breath, respiratory distress, coronary artery disease, stage III chronic kidney disease, hypertension, prostate cancer EXAM: PORTABLE CHEST 1 VIEW COMPARISON:  Portable exam 1401 hours compared to 03/16/2022 FINDINGS: Mild enlargement of cardiac silhouette. Mediastinal contours and pulmonary vascularity normal. Lungs clear. No acute infiltrate, pleural effusion, or pneumothorax. Osseous structures unremarkable. IMPRESSION: No acute abnormalities. Electronically Signed   By: Lavonia Dana M.D.   On: 03/19/2022 14:19   PERIPHERAL  VASCULAR CATHETERIZATION  Result Date: 03/18/2022 See surgical note for result.  ECHOCARDIOGRAM COMPLETE  Result Date: 03/18/2022    ECHOCARDIOGRAM REPORT   Patient Name:   Phillip [name] Date of Exam: 03/18/2022 Medical Rec #:  [id number]        Height:       69.0 in Accession #:    [id number]       Weight:       161.2 lb Date of Birth:  Jan 16, 1946        BSA:          1.885 m Patient Age:    36 years         BP:           126/84 mmHg Patient Gender: M                HR:           90 bpm. Exam Location:  [facility] Procedure: 2D Echo, Cardiac Doppler and Color Doppler Indications:     Atrial Fibrillation I48.91  History:         Patient has prior history of Echocardiogram examinations, most                  recent 12/25/2020. CAD; Risk Factors:Hypertension.  Sonographer:     Sherrie Sport Referring Phys:  [id number] Sharen Hones Diagnosing Phys: Isaias Cowman MD  Sonographer Comments: Technically challenging study due to limited acoustic windows, no apical window and no subcostal window. IMPRESSIONS  1. Left ventricular ejection fraction, by estimation, is 60 to 65%. The left ventricle has normal function. The left ventricle has no regional wall motion abnormalities. Left ventricular diastolic parameters are indeterminate.  2. Right ventricular systolic function  is normal. The right ventricular size is normal.  3. Large thrombus noted in right atrium.  4. The mitral valve is normal in structure. Mild mitral valve regurgitation. No evidence of mitral stenosis.  5. The aortic valve is normal in structure. Aortic valve regurgitation is not visualized. No aortic stenosis is present.  6. The inferior vena cava is normal in size with greater than 50% respiratory variability, suggesting right atrial pressure of 3 mmHg. FINDINGS  Left Ventricle: RA thrombus noted. Left ventricular ejection fraction, by estimation, is 60 to 65%. The left ventricle has normal function. The left ventricle has no regional wall motion  abnormalities. The left ventricular internal cavity size was normal in size. There is no left ventricular hypertrophy. Left ventricular diastolic parameters are indeterminate. Right Ventricle: The right ventricular size is normal. No increase in right ventricular wall thickness. Right ventricular systolic function is normal. Left Atrium: Left atrial size was normal in size. Right Atrium: Large thrombus noted in right atrium. Right atrial size was normal in size. Pericardium: There is no evidence of pericardial effusion. Mitral Valve: The mitral valve is normal in structure. Mild mitral valve regurgitation. No evidence of mitral valve stenosis. Tricuspid Valve: The tricuspid valve is normal in structure. Tricuspid valve regurgitation is mild . No evidence of tricuspid stenosis. Aortic Valve: The aortic valve is normal in structure. Aortic valve regurgitation is not visualized. No aortic stenosis is present. Pulmonic Valve: The pulmonic valve was normal in structure. Pulmonic valve regurgitation is not visualized. No evidence of pulmonic stenosis. Aorta: The aortic root is normal in size and structure. Venous: The inferior vena cava is normal in size with greater than 50% respiratory variability, suggesting right atrial pressure of 3 mmHg. IAS/Shunts: No atrial level shunt detected by color flow Doppler.  LEFT VENTRICLE PLAX 2D LVIDd:         3.95 cm LVIDs:         2.60 cm LV PW:         1.30 cm LV IVS:        1.40 cm  Isaias Cowman MD Electronically signed by Isaias Cowman MD Signature Date/Time: 03/18/2022/1:00:12 PM    Final (Updated)    CT Angio Chest Pulmonary Embolism (PE) W or WO Contrast  Addendum Date: 03/17/2022   ADDENDUM REPORT: 03/17/2022 20:06 ADDENDUM: These results were called by telephone at the time of interpretation on 03/17/2022 at 8:06 pm to provider NP BRENDA MORRRISON, who verbally acknowledged these results. Electronically Signed   By: Fidela Salisbury M.D.   On: 03/17/2022  20:06   Result Date: 03/17/2022 CLINICAL DATA:  Shortness of breath. EXAM: CT ANGIOGRAPHY CHEST WITH CONTRAST TECHNIQUE: Multidetector CT imaging of the chest was performed using the standard protocol during bolus administration of intravenous contrast. Multiplanar CT image reconstructions and MIPs were obtained to evaluate the vascular anatomy. RADIATION DOSE REDUCTION: This exam was performed according to the departmental dose-optimization program which includes automated exposure control, adjustment of the mA and/or kV according to patient size and/or use of iterative reconstruction technique. CONTRAST:  8m OMNIPAQUE IOHEXOL 350 MG/ML SOLN COMPARISON:  Feb 11, 2022 FINDINGS: Cardiovascular: Satisfactory opacification of the pulmonary arteries to the segmental level. Bilateral central pulmonary emboli involving the main pulmonary arteries and extending to wall lobar branches with nearly occlusive clot to the right lower lobe, left upper lobe and several segmental branches. RV/LV ratio of 1.6, significant for right heart strain. Mediastinum/Nodes: No enlarged mediastinal, hilar, or axillary lymph nodes. Thyroid gland, trachea,  and esophagus demonstrate no significant findings. Lungs/Pleura: Scattered areas of ground-glass opacities throughout the lung parenchyma likely representing hypoperfusion. Atelectasis versus developing pulmonary infarcts in the bilateral lower lobes dependently. 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 Musculoskeletal: Sclerotic focus within T4, minimally enlarged since July 2022. Review of the MIP images confirms the above findings. IMPRESSION: 1. Bilateral central pulmonary emboli involving the main pulmonary arteries and extending to all lobar branches with nearly occlusive clot to the right lower lobe, left upper lobe and several segmental branches. 2. Positive for acute PE with CT evidence of right heart strain (RV/LV  Ratio = 1.6) consistent with at least submassive (intermediate risk) PE. The presence of right heart strain has been associated with an increased risk of morbidity and mortality. 3. Scattered areas of ground-glass opacities throughout the lung parenchyma likely representing hypoperfusion. 4. Atelectasis versus developing pulmonary infarcts in the bilateral lower lobes dependently. 5. Mildly increased sclerotic focus within T4 vertebral body. Please correlate clinically. Electronically Signed: By: Fidela Salisbury M.D. On: 03/17/2022 19:40   DG Chest 1 View  Result Date: 03/16/2022 CLINICAL DATA:  Provided history: Shortness of breath. EXAM: CHEST  1 VIEW COMPARISON:  CT angiogram chest 02/11/2022. Prior chest radiographs 02/11/2022 and earlier. FINDINGS: Heart size within normal limits. Linear opacities within the left mid-to-lower lung field. No appreciable airspace consolidation within the right lung. No evidence of pleural effusion or pneumothorax. No acute bony abnormality identified. IMPRESSION: Linear opacities within the left mid-to-lower lung field, with an appearance favoring atelectasis. No definite airspace consolidation. Electronically Signed   By: Kellie Simmering D.O.   On: 03/16/2022 12:53   CT ANGIO LOW EXTREM LEFT W &/OR WO CONTRAST  Result Date: 03/12/2022 CLINICAL DATA:  Persistent ischemia of the left lower extremity. EXAM: CT ANGIOGRAPHY LOWER LEFT EXTREMITY TECHNIQUE: Multidetector CT imaging of the abdomen, pelvis and left lower extremities was performed using the standard protocol during bolus administration of intravenous contrast. Multiplanar CT image reconstructions and MIPs were obtained to evaluate the vascular anatomy. Multidetector CT imaging of the abdomen, pelvis and lower extremities was performed using the standard protocol during bolus administration of intravenous contrast. Multiplanar CT image reconstructions and MIPs were obtained to evaluate the vascular anatomy.  CONTRAST:  131m OMNIPAQUE IOHEXOL 350 MG/ML SOLN COMPARISON:  CT angiography of abdominal aorta with iliofemoral runoff dated 03/11/2022. FINDINGS: VASCULAR Aorta: Normal caliber without aneurysm or dissection. Scattered atherosclerotic calcifications. No significant stenosis. IMA: Stenosis at the origin due to aortic plaque. IMA is patent. LEFT Lower Extremity Inflow: Common, internal and external iliac arteries are patent without evidence of aneurysm, dissection, vasculitis or significant stenosis. Outflow: Left common femoral artery is patent. Left profunda femoral arteries are patent. Left SFA is patent with mild atherosclerotic disease. Nonocclusive filling defect in the distal left SFA on Image 161/4. Interval placement of vascular stent within left popliteal artery at the level of the knee across the site of previous intraluminal thrombus. The stented portion of the popliteal artery appears patent. Runoff: As noted previously, the proximal and mid anterior tibial artery are patent. Signs of thromboembolic disease in the runoff vessels of the left lower extremity are again noted including: -Persistent abrupt occlusion in the distal anterior tibial artery, image 313/4. -Persistent distal thrombus in the posterior tibial artery noted, image 308/4. -persistent distal occlusive thrombus in the peroneal artery with signs of distal reconstitution, image 320/4. Veins: No obvious venous abnormality within the limitations of  this arterial phase study. Review of the MIP images confirms the above findings. Non-vascular Bowel: Scattered colonic diverticula. No acute abnormality involving the appendix. No evidence for bowel dilatation or focal bowel inflammation. Lymphatic: No lymph node enlargement in the abdomen or pelvis. Reproductive: Patient has penile implants within old fluid reservoir on right side and a new fluid reservoir on the left. Subcutaneous gas around this new left device is compatible with recent  surgery. There is probably a small prostate with postoperative changes. Other: Negative for free fluid. Negative for free air. Subcutaneous gas extending into the left scrotum compatible with prior surgery. Cannot exclude a small amount of fluid in left scrotum. Musculoskeletal: No acute bone abnormality. IMPRESSION: 1. Interval placement of vascular stent within the left popliteal artery at the level of the knee across the site of previous intraluminal thrombus. The stented portion of the popliteal artery appears patent. 2. Persistent occlusion of the distal left anterior tibial, posterior tibial arteries and peroneal artery compatible with thromboembolic disease. Distal reconstitution of the peroneal artery as noted previously. 3. Since the previous exam there are no signs to suggest acute proximal occlusion to account for the progressive left foot ischemia. 4. Nonocclusive filling defect within the distal left SFA. 5. Aortic Atherosclerosis (ICD10-I70.0). Electronically Signed   By: Kerby Moors M.D.   On: 03/12/2022 19:04   PERIPHERAL VASCULAR CATHETERIZATION  Result Date: 03/11/2022 See surgical note for result.  CT Angio Aortobifemoral W and/or Wo Contrast  Result Date: 03/11/2022 CLINICAL DATA:  Left leg that is getting worse. Concern for ischemia. Reportedly, the patient had recent urologic surgery. EXAM: CT ANGIOGRAPHY OF ABDOMINAL AORTA WITH ILIOFEMORAL RUNOFF TECHNIQUE: Multidetector CT imaging of the abdomen, pelvis and lower extremities was performed using the standard protocol during bolus administration of intravenous contrast. Multiplanar CT image reconstructions and MIPs were obtained to evaluate the vascular anatomy. RADIATION DOSE REDUCTION: This exam was performed according to the departmental dose-optimization program which includes automated exposure control, adjustment of the mA and/or kV according to patient size and/or use of iterative reconstruction technique. CONTRAST:  132m  OMNIPAQUE IOHEXOL 350 MG/ML SOLN COMPARISON:  CT abdomen and pelvis 02/05/2022 FINDINGS: VASCULAR Aorta: Normal caliber without aneurysm or dissection. Scattered atherosclerotic calcifications. No significant stenosis. Celiac: Patent without evidence of aneurysm, dissection, vasculitis or significant stenosis. Splenic artery is heavily calcified. SMA: Patent without evidence of aneurysm or significant stenosis. There is a small linear density in the mid SMA on sequence 4 image 66 and difficult to exclude focal dissection or intimal flap in this area. No other evidence for dissection or intimal flap. Renals: Mixed plaque at the origin and proximal aspect of the right renal artery causing approximately 50% stenosis. Left renal artery is patent without significant stenosis. No evidence for aneurysm or dissection involving renal arteries. IMA: Stenosis at the origin due to aortic plaque.  IMA is patent. RIGHT Lower Extremity Inflow: Common, internal and external iliac arteries are patent without evidence of aneurysm, dissection, vasculitis or significant stenosis. Outflow: Right common femoral artery is patent. Proximal right profunda femoral arteries are patent. Proximal right SFA is patent. Decreased opacification in the mid and distal right SFA on the initial set of images. The right popliteal artery is not opacified with contrast on the initial set of images compared to the left lower extremity but there is flow in the right popliteal artery on the delayed images suggesting slow flow in the right popliteal artery. Runoff: Limited evaluation but there may be a small  amount of flow the proximal runoff vessels. LEFT Lower Extremity Inflow: Common, internal and external iliac arteries are patent without evidence of aneurysm, dissection, vasculitis or significant stenosis. Outflow: Left common femoral artery is patent. Left profunda femoral arteries are patent. Left SFA is patent with mild atherosclerotic disease.  Nonocclusive filling defect in the distal left SFA on sequence 4, image 234 and there is eccentric thrombus in the left popliteal artery above the knee causing severe stenosis on sequence 4 image 284. Additional intraluminal thrombus in the popliteal artery below the knee on image 310. Configuration of this thrombus is concerning for embolic disease. Runoff: The proximal and mid anterior tibial artery is patent but there is an abrupt occlusion in the distal anterior tibial artery. Distal thrombus in the posterior tibial artery. Distal occlusion in the peroneal artery with distal reconstitution. Findings are most compatible with thromboembolic disease in the runoff vessels. Veins: No obvious venous abnormality within the limitations of this arterial phase study. Review of the MIP images confirms the above findings. NON-VASCULAR Lower chest: Calcified granuloma in the left lower lobe. No pleural effusions. Hepatobiliary: Normal appearance of the liver and gallbladder. No biliary dilatation. Pancreas: Unremarkable. No pancreatic ductal dilatation or surrounding inflammatory changes. Spleen: Normal in size without focal abnormality. Adrenals/Urinary Tract: Normal appearance of the adrenal glands. Bilateral renal cysts without hydronephrosis. Again noted is a small exophytic hyperdense structure in the inferior left kidney that measures up to 1.1 cm on sequence 4 image 88. 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 Tiny focus of gas in the urinary bladder which may be iatrogenic. No ureter dilatation. Focal low-density near the base of the bladder likely related to previous bladder or urethral surgery. Stomach/Bowel: Scattered colonic diverticula. No acute abnormality involving the appendix. No evidence for bowel dilatation or focal bowel inflammation. Lymphatic: No lymph node enlargement in the abdomen or pelvis. Reproductive: Patient has  penile implants within old fluid reservoir on right side and a new fluid reservoir on the left. Subcutaneous gas around this new left device is compatible with recent surgery. There is probably a small prostate with postoperative changes. Other: Negative for free fluid. Negative for free air. Subcutaneous gas extending into the left scrotum compatible with prior surgery. Cannot exclude a small amount of fluid in left scrotum. Musculoskeletal: No acute bone abnormality. IMPRESSION: VASCULAR 1. Asymmetric flow to the lower extremities with delayed flow in the right lower extremity. Evidence for eccentric thrombus in the left outflow vessels with distal occlusions in left runoff vessels. Findings are concerning for thromboembolic disease based on the disease configuration. Delayed filling of the right popliteal artery is concerning for underlying occlusions and thromboembolic disease in the right runoff vessels. 2.  Aortic Atherosclerosis (ICD10-I70.0). 3. 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 4. Question small intimal flap or focal dissection in the SMA. 5. Stenosis at the origin of the IMA related to aortic plaque. NON-VASCULAR 1. New postoperative changes in the pelvis and left scrotum associated with penile prosthesis. 2. Bilateral renal cysts. 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 These results were called by telephone at the time of interpretation on 03/11/2022 at 2:57 pm to provider [facility] , who verbally acknowledged these results. Electronically Signed   By: Markus Daft M.D.   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 Recommend long-term anticoagulation. He is at high risk of recurrent thrombosis. I recommend bridging perioperatively if he needs to have any elective procedure.  JAK2 V617F mutation Recommend  work-up for myeloproliferative disease. Obtain bone marrow biopsy.  He agrees with the plan.  History of prostate cancer Diagnosed in 2013,treated with cryoablation.  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    Answer:   [facility]    Order Specific Question:   Radiology Contrast Protocol - do NOT remove file path    Answer:   [email or web address]   Follow-up 2 weeks after bone marrow biopsy. All questions were answered. The patient knows to call the clinic with any problems, questions or concerns. cc Rusty Aus, MD  Thank you for this kind referral and the opportunity to participate in the care of this patient. A copy of today's note is routed to referring provider   Earlie Server, MD, PhD [facility] Hematology Oncology 05/19/2022

## 2022-05-19 NOTE — Assessment & Plan Note (Signed)
Recurrent DVT and embolism Continue Eliquis, patient is clinically doing well. Recommend long-term anticoagulation. He is at high risk of recurrent thrombosis. I recommend bridging perioperatively if he needs to have any elective procedure.

## 2022-05-19 NOTE — Assessment & Plan Note (Addendum)
Diagnosed in 2013,treated with cryoablation.  Follow up with urology

## 2022-05-19 NOTE — Telephone Encounter (Signed)
Per LOS on 8/31:   Bone marrow biopsy JAK2 mutation positive.   MD 2 week after biopsy.

## 2022-05-20 ENCOUNTER — Encounter (INDEPENDENT_AMBULATORY_CARE_PROVIDER_SITE_OTHER): Payer: Self-pay | Admitting: Nurse Practitioner

## 2022-05-20 ENCOUNTER — Ambulatory Visit (INDEPENDENT_AMBULATORY_CARE_PROVIDER_SITE_OTHER): Payer: Medicare HMO | Admitting: Nurse Practitioner

## 2022-05-20 ENCOUNTER — Ambulatory Visit (INDEPENDENT_AMBULATORY_CARE_PROVIDER_SITE_OTHER): Payer: Medicare HMO

## 2022-05-20 VITALS — BP 138/69 | HR 66 | Resp 17

## 2022-05-20 DIAGNOSIS — I7090 Unspecified atherosclerosis: Secondary | ICD-10-CM

## 2022-05-20 DIAGNOSIS — Z1589 Genetic susceptibility to other disease: Secondary | ICD-10-CM

## 2022-05-20 DIAGNOSIS — Z89512 Acquired absence of left leg below knee: Secondary | ICD-10-CM

## 2022-05-20 NOTE — Telephone Encounter (Signed)
pt scheduled for biopsy on Wed 9/13 at 8:30a with an arrival of 7:30a. Pt informed of biopsy appt.    Please schedule MD only approx 2 weeks after biopsy and inform pt of appt. Thanks.

## 2022-05-21 ENCOUNTER — Encounter (INDEPENDENT_AMBULATORY_CARE_PROVIDER_SITE_OTHER): Payer: Self-pay | Admitting: Nurse Practitioner

## 2022-05-21 NOTE — Progress Notes (Signed)
Subjective:    Patient ID: Phillip [name], male    DOB: 09/09/1946, 76 y.o.   MRN: [id number] No chief complaint on file.    Phillip [name] is a 76 year old male who returns today for follow-up evaluation after multiple vascular events following a recent hospitalization.  The patient underwent bladder surgery for which she had to stop his Eliquis for several days prior.  He presented to [facility] with a ischemic left foot.  He underwent intervention on 03/11/2022 with subsequent reintervention due to restenosis on 03/13/2022.  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  Shortly following his amputation the patient developed bilateral DVTs with subsequent pulmonary embolism with pulmonary thrombectomy on 03/18/2022.  Due to the clot burden he underwent IVC placement on 03/19/2022.  The patient subsequently had a worsening clot development and underwent additional pulmonary thrombectomy on 03/23/2022.  The left below-knee amputation is well-healed.  The patient is very motivated to begin fitting for his prosthesis.  He has a shrinker sock that it fits well and it is also shaped very well.  There was concern for possible perfusion issues and the patient had ABIs done today.  He has a right ABI of 1.28 and TBI 0.87.  Waveforms are triphasic in the tibial arteries.  Toe waveforms are normal.  Recent follow-up showed JAK2 V617F mutation.  There is concern for possible myeloproliferative disease.  He is supposed to have an upcoming bone marrow biopsy.    Review of Systems  Musculoskeletal:  Positive for gait problem.  All other systems reviewed and are negative.      Objective:   Physical Exam Vitals reviewed.  HENT:     Head: Normocephalic.  Cardiovascular:     Rate and Rhythm: Normal rate.  Pulmonary:     Effort: Pulmonary effort is normal.  Musculoskeletal:     Left Lower  Extremity: Left leg is amputated below knee.  Skin:    General: Skin is warm and dry.  Neurological:     Mental Status: He is alert and oriented to person, place, and time.     Gait: Gait abnormal.  Psychiatric:        Mood and Affect: Mood normal.        Behavior: Behavior normal.        Thought Content: Thought content normal.        Judgment: Judgment normal.     BP 138/69 (BP Location: Left Arm)   Pulse 66   Resp 17   Past Medical History:  Diagnosis Date   Arthritis    Basal cell carcinoma    L clavicle, L prox forearm- removed years ago    Bladder cancer (HCC)    Bladder neck contracture    CAD (coronary artery disease)    a. 12/2020 NSTEMI/Cath: LM nl, LAD 95ost/p, LCX 50p, RCA nl. EF 45-50%.   Cataract    CKD (chronic kidney disease), stage III [facility])    ED (erectile dysfunction)    Frequency    GERD (gastroesophageal reflux disease)    History of pulmonary embolus (PE) 11/2018   a. Following LE DVT-->Chronic warfarin.   Hyperlipidemia LDL goal <70    Hypertension    Hypothyroidism    Incontinence of urine    sui, s/p cryoablation   Ischemic cardiomyopathy    a. 11/2018 Echo: EF 60-65%; b. 12/2020 LV Gram: EF 45-50% in setting of NSTEMI.  Kidney stones    Neuropathy    feet   Nocturia    Prostate cancer (HCC)    S/P   CRYOABLATION   Right elbow tendinitis    Right Lower Extremity DVT (deep venous thrombosis) ([place]) 11/25/2018   Vertigo    1-2x/yr   Wears glasses     Social History   Socioeconomic History   Marital status: Married    Spouse name: Kj Imbert   Number of children: Not on file   Years of education: Not on file   Highest education level: Not on file  Occupational History   Not on file  Tobacco Use   Smoking status: Never   Smokeless tobacco: Never  Vaping Use   Vaping Use: Never used  Substance and Sexual Activity   Alcohol use: No   Drug use: No   Sexual activity: Not Currently    Birth control/protection: None  Other  Topics Concern   Not on file  Social History Narrative   Lives locally with wife.  Exercises regularly - gym/golf.  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   Social Determinants of Health   Financial Resource Strain: Not on file  Food Insecurity: Not on file  Transportation Needs: Not on file  Physical Activity: Not on file  Stress: Not on file  Social Connections: Not on file  Intimate Partner Violence: Not on file    Past Surgical History:  Procedure Laterality Date   AMPUTATION Left 03/16/2022   Procedure: AMPUTATION BELOW KNEE;  Surgeon: Katha Cabal, MD;  Location: [facility];  Service: Vascular;  Laterality: Left;   ARTHROPLASTY AND TENDON REPAIR LEFT THUMB  JAN 2014   CATARACT EXTRACTION W/PHACO Left 11/16/2020   Procedure: CATARACT EXTRACTION PHACO AND INTRAOCULAR LENS PLACEMENT (IOC) LEFT  7.09 00:56.4 12.6%;  Surgeon: Leandrew Koyanagi, MD;  Location: [place];  Service: Ophthalmology;  Laterality: Left;   CATARACT EXTRACTION W/PHACO Right 12/02/2020   Procedure: CATARACT EXTRACTION PHACO AND INTRAOCULAR LENS PLACEMENT (IOC) RIGHT MALY[place] 5.52 00:49.7 11.1%;  Surgeon: Leandrew Koyanagi, MD;  Location: [place];  Service: Ophthalmology;  Laterality: Right;   COLONOSCOPY     COLONOSCOPY WITH PROPOFOL N/A 08/09/2017   Procedure: COLONOSCOPY WITH PROPOFOL;  Surgeon: Manya Silvas, MD;  Location: [facility];  Service: Endoscopy;  Laterality: N/A;   CORONARY ANGIOGRAPHY N/A 12/28/2020   Procedure: CORONARY ANGIOGRAPHY;  Surgeon: Sherren Mocha, MD;  Location: [place] [facility];  Service: Cardiovascular;  Laterality: N/A;   CORONARY STENT INTERVENTION N/A 12/28/2020   Procedure: CORONARY STENT INTERVENTION;  Surgeon: Sherren Mocha, MD;  Location: [place] [facility];  Service: Cardiovascular;  Laterality: N/A;   ESOPHAGOGASTRODUODENOSCOPY (EGD) WITH PROPOFOL N/A 08/09/2017   Procedure:  ESOPHAGOGASTRODUODENOSCOPY (EGD) WITH PROPOFOL;  Surgeon: Manya Silvas, MD;  Location: [facility]COPY;  Service: Endoscopy;  Laterality: N/A;   GREEN LIGHT LASER TURP (TRANSURETHRAL RESECTION OF PROSTATE N/A 12/14/2015   Procedure: GREEN LIGHT LASER TURP (TRANSURETHRAL RESECTION OF PROSTATE) LASER OF BLADDER NECK CONTRACTURE;  Surgeon: Carolan Clines, MD;  Location: [place];  Service: Urology;  Laterality: N/A;   IVC FILTER INSERTION N/A 03/19/2022   Procedure: IVC FILTER INSERTION;  Surgeon: Serafina Mitchell, MD;  Location: [place] [facility];  Service: Cardiovascular;  Laterality: N/A;   LEFT HEART CATH AND CORONARY ANGIOGRAPHY N/A 12/24/2020   Procedure: LEFT HEART CATH AND CORONARY ANGIOGRAPHY and possible PCI and stent;  Surgeon: Yolonda Kida,  MD;  Location: [place] [facility];  Service: Cardiovascular;  Laterality: N/A;   LEFT HEART CATH AND CORONARY ANGIOGRAPHY N/A 01/06/2021   Procedure: LEFT HEART CATH AND CORONARY ANGIOGRAPHY;  Surgeon: Troy Sine, MD;  Location: [place] [facility];  Service: Cardiovascular;  Laterality: N/A;   LOWER EXTREMITY ANGIOGRAPHY Left 03/11/2022   Procedure: Lower Extremity Angiography;  Surgeon: Katha Cabal, MD;  Location: [place] [facility];  Service: Cardiovascular;  Laterality: Left;   PENILE PROSTHESIS IMPLANT N/A 12/06/2013   Procedure: PENILE PROTHESIS INFLATABLE;  Surgeon: Ailene Rud, MD;  Location: [facility];  Service: Urology;  Laterality: N/A;   PROSTATE CRYOABLATION  06-01-2012  [facility]   PULMONARY THROMBECTOMY N/A 03/18/2022   Procedure: PULMONARY THROMBECTOMY;  Surgeon: Algernon Huxley, MD;  Location: [place] [facility];  Service: Cardiovascular;  Laterality: N/A;   PULMONARY THROMBECTOMY Bilateral 03/23/2022   Procedure: PULMONARY THROMBECTOMY;  Surgeon: Katha Cabal, MD;  Location: [place] [facility];  Service: Cardiovascular;  Laterality: Bilateral;   THROMBECTOMY ILIAC  ARTERY Left 03/12/2022   Procedure: THROMBECTOMY LEG;  Surgeon: Conrad Clarendon, MD;  Location: [facility];  Service: Vascular;  Laterality: Left;   TRANSURETHRAL RESECTION OF BLADDER NECK N/A 03/20/2015   Procedure: RELEASE BLADDER NECK CONTRACTURE  WITH WOLF BUTTON ELECTRODE ;  Surgeon: Carolan Clines, MD;  Location: [place];  Service: Urology;  Laterality: N/A;   TRANSURETHRAL RESECTION OF BLADDER TUMOR N/A 12/05/2018   Procedure: TRANSURETHRAL RESECTION OF BLADDER TUMOR (TURBT)/CYSTOSCOPY/  INSTILLATION OF Rodell Perna;  Surgeon: Ceasar Mons, MD;  Location: [facility];  Service: Urology;  Laterality: N/A;   TRANSURETHRAL RESECTION OF BLADDER TUMOR N/A 01/15/2020   Procedure: TRANSURETHRAL RESECTION OF BLADDER TUMOR (TURBT)/ CYSTOSCOPY;  Surgeon: Ceasar Mons, MD;  Location: [facility];  Service: Urology;  Laterality: N/A;   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      Family History  Problem Relation Age of Onset   CAD Father 22    Allergies  Allergen Reactions   Doxazosin Other (See Comments)    Other reaction(s): Unknown       Latest Ref Rng & Units 03/27/2022    5:18 AM 03/26/2022    4:33 AM 03/25/2022    4:43 AM  CBC  WBC 4.0 - 10.5 K/uL 21.3  21.8  23.0   Hemoglobin 13.0 - 17.0 g/dL 7.5  7.8  8.0   Hematocrit 39.0 - 52.0 % 24.6  25.5  26.0   Platelets 150 - 400 K/uL 531  529  565       CMP     Component Value Date/Time   NA 134 (L) 03/28/2022 0338   NA 141 10/25/2014 2206   K 4.7 03/28/2022 0338   K 4.0 10/25/2014 2206   CL 109 03/28/2022 0338   CL 109 (H) 10/25/2014 2206   CO2 19 (L) 03/28/2022 0338   CO2 26 10/25/2014 2206   GLUCOSE 121 (H) 03/28/2022 0338   GLUCOSE 210 (H) 10/25/2014 2206   BUN 45 (H) 03/28/2022 0338   BUN 21 08/15/2016 1618   BUN 15 10/25/2014 2206   CREATININE 1.81 (H) 03/28/2022 0338   CREATININE 1.32 (H) 10/25/2014 2206   CALCIUM 8.0 (L)  03/28/2022 0338   CALCIUM 8.2 (L) 10/25/2014 2206   PROT 5.9 (L) 03/13/2022 0114   PROT 7.0 10/25/2014 2206   ALBUMIN 3.2 (L) 03/13/2022 0114   ALBUMIN  3.2 (L) 10/25/2014 2206   AST 23 03/13/2022 0114   AST 20 10/25/2014 2206   ALT 19 03/13/2022 0114   ALT 22 10/25/2014 2206   ALKPHOS 43 03/13/2022 0114   ALKPHOS 58 10/25/2014 2206   BILITOT 0.7 03/13/2022 0114   BILITOT 0.2 10/25/2014 2206   GFRNONAA 39 (L) 03/28/2022 0338   GFRNONAA 58 (L) 10/25/2014 2206   GFRAA >60 03/27/2019 0637   GFRAA >60 10/25/2014 2206     No results found.     Assessment & Plan:   1. Hx of BKA, left [facility]) Mr. Pellegrino Kennard was seen for further evaluation for left below knee prosthesis.  The is a 76 year old male who had amputation on 03/16/2022.  At the time he is well healed and ready for fitting of new below knee prosthesis.  He is a highly motivated individual and should do well once fitted with prosthesis.  She has no problem returning to a K2 ambulator, which will allow him to walk inside and outside of his home and overload level barriers.   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  He will be having a bone marrow biopsy.  We will continue to have the patient keep his IVC filter for now as there may be further test.  We will have the patient return in 3 months to discuss filter removal.   Current Outpatient Medications on File Prior to Visit  Medication Sig Dispense Refill   acetaminophen (TYLENOL) 500 MG tablet Take 500 mg by mouth every 6 (six) hours as needed.     ascorbic acid (VITAMIN C) 500 MG tablet Take 500 mg by mouth daily.     baclofen (LIORESAL) 10 MG tablet Take 10 mg by mouth 2 (two) times daily.     cetirizine (ZYRTEC) 10 MG tablet Take 10 mg by mouth daily.     clopidogrel (PLAVIX) 75 MG tablet Take 1 tablet (75 mg total) by mouth daily with breakfast. 90 tablet 3   co-enzyme Q-10 30 MG capsule Take 30 mg by mouth daily.     melatonin  5 MG TABS Take 1 tablet (5 mg total) by mouth at bedtime. 30 tablet 0   metoprolol tartrate (LOPRESSOR) 25 MG tablet Take 0.5 tablets (12.5 mg total) by mouth 2 (two) times daily. 60 tablet 0   Multiple Vitamin (MULTIVITAMIN) tablet Take 1 tablet by mouth daily.     pantoprazole (PROTONIX) 40 MG tablet Take 1 tablet (40 mg total) by mouth daily. 90 tablet 3   pravastatin (PRAVACHOL) 40 MG tablet Take 40 mg by mouth daily.     apixaban (ELIQUIS) 5 MG TABS tablet Take 2 tablets (10 mg total) by mouth 2 (two) times daily for 9 doses. Start in evening 03/28/2022 (had AM dose is hospital) 18 tablet 0   apixaban (ELIQUIS) 5 MG TABS tablet Take 1 tablet (5 mg total) by mouth 2 (two) times daily. 60 tablet 0   cyclobenzaprine (FLEXERIL) 5 MG tablet Take 1 tablet (5 mg total) by mouth 3 (three) times daily as needed for muscle spasms. (Patient not taking: Reported on 05/19/2022) 30 tablet 0   Glucosamine-Chondroit-Vit C-Mn (GLUCOSAMINE CHONDR 1500 COMPLX PO) Take 1 tablet by mouth every evening. (Patient not taking: Reported on 05/19/2022)     levothyroxine (SYNTHROID) 50 MCG tablet Take 50 mcg by mouth.     nitroGLYCERIN (NITROSTAT) 0.4 MG SL tablet Place 1 tablet (0.4 mg total) under the tongue every 5 (five) minutes  x 3 doses as needed for chest pain. (Patient not taking: Reported on 05/19/2022) 25 tablet 3   polyethylene glycol (MIRALAX / GLYCOLAX) 17 g packet Take 17 g by mouth 2 (two) times daily as needed for moderate constipation or mild constipation. (Patient not taking: Reported on 05/19/2022) 14 each 0   traMADol (ULTRAM) 50 MG tablet Take 50 mg by mouth every 6 (six) hours as needed for mild pain. (Patient not taking: Reported on 05/19/2022)     traZODone (DESYREL) 50 MG tablet Take 1-2 tablets (50-100 mg total) by mouth at bedtime as needed for sleep. (Patient not taking: Reported on 05/19/2022) 60 tablet 0   Turmeric (QC TUMERIC COMPLEX PO) Take by mouth. (Patient not taking: Reported on 05/20/2022)      No current facility-administered medications on file prior to visit.    There are no Patient Instructions on file for this visit. No follow-ups on file.   Kris Hartmann, NP

## 2022-05-26 DIAGNOSIS — Z0000 Encounter for general adult medical examination without abnormal findings: Secondary | ICD-10-CM | POA: Diagnosis not present

## 2022-05-26 DIAGNOSIS — E538 Deficiency of other specified B group vitamins: Secondary | ICD-10-CM | POA: Diagnosis not present

## 2022-05-26 DIAGNOSIS — D6859 Other primary thrombophilia: Secondary | ICD-10-CM | POA: Diagnosis not present

## 2022-05-26 DIAGNOSIS — E119 Type 2 diabetes mellitus without complications: Secondary | ICD-10-CM | POA: Diagnosis not present

## 2022-05-26 DIAGNOSIS — Z89512 Acquired absence of left leg below knee: Secondary | ICD-10-CM | POA: Diagnosis not present

## 2022-05-26 DIAGNOSIS — E782 Mixed hyperlipidemia: Secondary | ICD-10-CM | POA: Diagnosis not present

## 2022-05-30 NOTE — Progress Notes (Signed)
Spoke with patient 05/30/22 @ 11:30. Went over pre-procedure instructions to include the need to arrive at 7:30 for 8:30 procedure, need to be NPO after midnight on night prior to procedure, need to hold Eiquis and Plavix morning of procedure and need for driver post-procedure. Patient verbalized understanding.

## 2022-05-31 ENCOUNTER — Other Ambulatory Visit: Payer: Self-pay | Admitting: Internal Medicine

## 2022-05-31 DIAGNOSIS — Z1589 Genetic susceptibility to other disease: Secondary | ICD-10-CM

## 2022-05-31 NOTE — H&P (Signed)
Chief Complaint: Patient was seen in consultation today for bone marrow aspiration/biopsy  Referring Physician(s): Yu,Zhou  Supervising Physician: Daryll Brod  Patient Status: [facility] - Out-pt  History of Present Illness: Phillip [name] is a 76 y.o. 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 Phillip [name] has had multiple DVTs/PEs since 2020 and has been on multiple anticoagulation medications. Most recently he required thrombectomy and lower extremity bypass 03/12/22 due to DVT however his limb was later determined to be non salvageable and he underwent a left BKA. He then developed PE 03/17/22 which required thrombolysis and mechanical thrombectomy. Heparin gtt was continued. He was found to have RLE DVT 03/19/22 and underwent IVC filter placement 03/19/22. He was then found to have recurrent PE 03/23/22 while on heparin gtt which required mechanical thrombectomy  of the pulmonary arteries and the iliac veins/IVC that same day. He was then switched to Angiomax and later Argatroban gtt. During this admission he underwent hypercoagulability work up which was positive for JAK2 V6 63F mutation. He was discharged on Eliquis with hematology follow up. IR has been consulted for a bone marrow biopsy as part of work-up for myeloproliferative disease.  Past Medical History:  Diagnosis Date   Arthritis    Basal cell carcinoma    L clavicle, L prox forearm- removed years ago    Bladder cancer (HCC)    Bladder neck contracture    CAD (coronary artery disease)    a. 12/2020 NSTEMI/Cath: LM nl, LAD 95ost/p, LCX 50p, RCA nl. EF 45-50%.   Cataract    CKD (chronic kidney disease), stage III [facility])    ED (erectile dysfunction)    Frequency    GERD (gastroesophageal reflux disease)    History of pulmonary embolus (PE) 11/2018    a. Following LE DVT-->Chronic warfarin.   Hyperlipidemia LDL goal <70    Hypertension    Hypothyroidism    Incontinence of urine    sui, s/p cryoablation   Ischemic cardiomyopathy    a. 11/2018 Echo: EF 60-65%; b. 12/2020 LV Gram: EF 45-50% in setting of NSTEMI.   Kidney stones    Neuropathy    feet   Nocturia    Prostate cancer ([place])    S/P   CRYOABLATION   Right elbow tendinitis    Right Lower Extremity DVT (deep venous thrombosis) ([place]) 11/25/2018   Vertigo    1-2x/yr   Wears glasses     Past Surgical History:  Procedure Laterality Date   AMPUTATION Left 03/16/2022   Procedure: AMPUTATION BELOW KNEE;  Surgeon: Katha Cabal, MD;  Location: [facility] ORS;  Service: Vascular;  Laterality: Left;   ARTHROPLASTY AND TENDON REPAIR LEFT THUMB  JAN 2014   CATARACT EXTRACTION W/PHACO Left 11/16/2020   Procedure: CATARACT EXTRACTION PHACO AND INTRAOCULAR LENS PLACEMENT (IOC) LEFT  7.09 00:56.4 12.6%;  Surgeon: Leandrew Koyanagi, MD;  Location: [place];  Service: Ophthalmology;  Laterality: Left;   CATARACT EXTRACTION W/PHACO Right 12/02/2020   Procedure: CATARACT EXTRACTION PHACO AND INTRAOCULAR LENS PLACEMENT (IOC) RIGHT MALYUGIN 5.52 00:49.7 11.1%;  Surgeon: Leandrew Koyanagi, MD;  Location: [place];  Service: Ophthalmology;  Laterality: Right;   COLONOSCOPY     COLONOSCOPY WITH PROPOFOL N/A 08/09/2017   Procedure: COLONOSCOPY WITH PROPOFOL;  Surgeon: Manya Silvas, MD;  Location: [facility] ENDOSCOPY;  Service: Endoscopy;  Laterality: N/A;  CORONARY ANGIOGRAPHY N/A 12/28/2020   Procedure: CORONARY ANGIOGRAPHY;  Surgeon: Sherren Mocha, MD;  Location: [place] CV LAB;  Service: Cardiovascular;  Laterality: N/A;   CORONARY STENT INTERVENTION N/A 12/28/2020   Procedure: CORONARY STENT INTERVENTION;  Surgeon: Sherren Mocha, MD;  Location: [place] CV LAB;  Service: Cardiovascular;  Laterality: N/A;   ESOPHAGOGASTRODUODENOSCOPY (EGD) WITH PROPOFOL N/A  08/09/2017   Procedure: ESOPHAGOGASTRODUODENOSCOPY (EGD) WITH PROPOFOL;  Surgeon: Manya Silvas, MD;  Location: [facility] ENDOSCOPY;  Service: Endoscopy;  Laterality: N/A;   GREEN LIGHT LASER TURP (TRANSURETHRAL RESECTION OF PROSTATE N/A 12/14/2015   Procedure: GREEN LIGHT LASER TURP (TRANSURETHRAL RESECTION OF PROSTATE) LASER OF BLADDER NECK CONTRACTURE;  Surgeon: Carolan Clines, MD;  Location: [place];  Service: Urology;  Laterality: N/A;   IVC FILTER INSERTION N/A 03/19/2022   Procedure: IVC FILTER INSERTION;  Surgeon: Serafina Mitchell, MD;  Location: [place] CV LAB;  Service: Cardiovascular;  Laterality: N/A;   LEFT HEART CATH AND CORONARY ANGIOGRAPHY N/A 12/24/2020   Procedure: LEFT HEART CATH AND CORONARY ANGIOGRAPHY and possible PCI and stent;  Surgeon: Yolonda Kida, MD;  Location: [place] CV LAB;  Service: Cardiovascular;  Laterality: N/A;   LEFT HEART CATH AND CORONARY ANGIOGRAPHY N/A 01/06/2021   Procedure: LEFT HEART CATH AND CORONARY ANGIOGRAPHY;  Surgeon: Troy Sine, MD;  Location: [place] CV LAB;  Service: Cardiovascular;  Laterality: N/A;   LOWER EXTREMITY ANGIOGRAPHY Left 03/11/2022   Procedure: Lower Extremity Angiography;  Surgeon: Katha Cabal, MD;  Location: [place] CV LAB;  Service: Cardiovascular;  Laterality: Left;   PENILE PROSTHESIS IMPLANT N/A 12/06/2013   Procedure: PENILE PROTHESIS INFLATABLE;  Surgeon: Ailene Rud, MD;  Location: [facility];  Service: Urology;  Laterality: N/A;   PROSTATE CRYOABLATION  06-01-2012  [place]   PULMONARY THROMBECTOMY N/A 03/18/2022   Procedure: PULMONARY THROMBECTOMY;  Surgeon: Algernon Huxley, MD;  Location: [place] CV LAB;  Service: Cardiovascular;  Laterality: N/A;   PULMONARY THROMBECTOMY Bilateral 03/23/2022   Procedure: PULMONARY THROMBECTOMY;  Surgeon: Katha Cabal, MD;  Location: [place] CV LAB;  Service: Cardiovascular;  Laterality: Bilateral;    THROMBECTOMY ILIAC ARTERY Left 03/12/2022   Procedure: THROMBECTOMY LEG;  Surgeon: Conrad Fox Chase, MD;  Location: [facility] ORS;  Service: Vascular;  Laterality: Left;   TRANSURETHRAL RESECTION OF BLADDER NECK N/A 03/20/2015   Procedure: RELEASE BLADDER NECK CONTRACTURE  WITH WOLF BUTTON ELECTRODE ;  Surgeon: Carolan Clines, MD;  Location: [place];  Service: Urology;  Laterality: N/A;   TRANSURETHRAL RESECTION OF BLADDER TUMOR N/A 12/05/2018   Procedure: TRANSURETHRAL RESECTION OF BLADDER TUMOR (TURBT)/CYSTOSCOPY/  INSTILLATION OF Rodell Perna;  Surgeon: Ceasar Mons, MD;  Location: [facility];  Service: Urology;  Laterality: N/A;   TRANSURETHRAL RESECTION OF BLADDER TUMOR N/A 01/15/2020   Procedure: TRANSURETHRAL RESECTION OF BLADDER TUMOR (TURBT)/ CYSTOSCOPY;  Surgeon: Ceasar Mons, MD;  Location: [facility];  Service: Urology;  Laterality: N/A;   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      Allergies: Doxazosin  Medications: Prior to Admission medications   Medication Sig Start Date End Date Taking? Authorizing Provider  acetaminophen (TYLENOL) 500 MG tablet Take 500 mg by mouth every 6 (six) hours as needed.    [provider]  apixaban (ELIQUIS) 5 MG TABS tablet Take 2 tablets (10 mg total) by mouth 2 (two) times daily for 9 doses. Start in evening  03/28/2022 (had AM dose is hospital) 03/28/22 05/19/22  Emeterio Reeve, DO  apixaban (ELIQUIS) 5 MG TABS tablet Take 1 tablet (5 mg total) by mouth 2 (two) times daily. 04/03/22 05/03/22  Emeterio Reeve, DO  ascorbic acid (VITAMIN C) 500 MG tablet Take 500 mg by mouth daily.    [provider]  baclofen (LIORESAL) 10 MG tablet Take 10 mg by mouth 2 (two) times daily. 05/09/22   [provider]  cetirizine (ZYRTEC) 10 MG tablet Take 10 mg by mouth daily.    [provider]  clopidogrel (PLAVIX) 75 MG tablet Take 1  tablet (75 mg total) by mouth daily with breakfast. 12/29/20   Kroeger, Lorelee Cover., PA-C  co-enzyme Q-10 30 MG capsule Take 30 mg by mouth daily.    [provider]  cyclobenzaprine (FLEXERIL) 5 MG tablet Take 1 tablet (5 mg total) by mouth 3 (three) times daily as needed for muscle spasms. Patient not taking: Reported on 05/19/2022 05/02/22   Kris Hartmann, NP  Glucosamine-Chondroit-Vit C-Mn (GLUCOSAMINE CHONDR 1500 COMPLX PO) Take 1 tablet by mouth every evening. Patient not taking: Reported on 05/19/2022    [provider]  levothyroxine (SYNTHROID) 50 MCG tablet Take 50 mcg by mouth. 04/20/20 05/19/22  [provider]  melatonin 5 MG TABS Take 1 tablet (5 mg total) by mouth at bedtime. 03/28/22   Emeterio Reeve, DO  metoprolol tartrate (LOPRESSOR) 25 MG tablet Take 0.5 tablets (12.5 mg total) by mouth 2 (two) times daily. 03/28/22   Emeterio Reeve, DO  Multiple Vitamin (MULTIVITAMIN) tablet Take 1 tablet by mouth daily.    [provider]  nitroGLYCERIN (NITROSTAT) 0.4 MG SL tablet Place 1 tablet (0.4 mg total) under the tongue every 5 (five) minutes x 3 doses as needed for chest pain. Patient not taking: Reported on 05/19/2022 12/29/20   Abigail Butts., PA-C  pantoprazole (PROTONIX) 40 MG tablet Take 1 tablet (40 mg total) by mouth daily. 12/29/20   Kroeger, Lorelee Cover., PA-C  polyethylene glycol (MIRALAX / GLYCOLAX) 17 g packet Take 17 g by mouth 2 (two) times daily as needed for moderate constipation or mild constipation. Patient not taking: Reported on 05/19/2022 03/28/22   Emeterio Reeve, DO  pravastatin (PRAVACHOL) 40 MG tablet Take 40 mg by mouth daily. 12/26/21   [provider]  traMADol (ULTRAM) 50 MG tablet Take 50 mg by mouth every 6 (six) hours as needed for mild pain. Patient not taking: Reported on 05/19/2022 03/08/22   [provider]  traZODone (DESYREL) 50 MG tablet Take 1-2 tablets (50-100 mg total) by mouth at bedtime as  needed for sleep. Patient not taking: Reported on 05/19/2022 03/28/22   Emeterio Reeve, DO  Turmeric (QC TUMERIC COMPLEX PO) Take by mouth. Patient not taking: Reported on 05/20/2022    [provider]     Family History  Problem Relation Age of Onset   CAD Father 36    Social History   Socioeconomic History   Marital status: Married    Spouse name: Terique Kawabata   Number of children: Not on file   Years of education: Not on file   Highest education level: Not on file  Occupational History   Not on file  Tobacco Use   Smoking status: Never   Smokeless tobacco: Never  Vaping Use   Vaping Use: Never used  Substance and Sexual Activity   Alcohol use: No   Drug use: No   Sexual activity: Not Currently  Birth control/protection: None  Other Topics Concern   Not on file  Social History Narrative   Lives locally with wife.  Exercises regularly - gym/golf.  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   Social Determinants of Health   Financial Resource Strain: Not on file  Food Insecurity: Not on file  Transportation Needs: Not on file  Physical Activity: Not on file  Stress: Not on file  Social Connections: Not on file     Review of Systems: A 12 point ROS discussed and pertinent positives are indicated in the HPI above.  All other systems are negative.  Review of Systems  Vital Signs: There were no vitals taken for this visit.  Physical Exam       Imaging: VAS [place] ABI WITH/WO TBI  Result Date: 05/27/2022  LOWER EXTREMITY DOPPLER STUDY Patient Name:  Phillip [name]  Date of Exam:   05/20/2022 Medical Rec #: [id number]         Accession #:    [id number] Date of Birth: [birth year]         Patient Gender: M Patient Age:   68 years Exam Location:  [facility] Procedure:      VAS [place] ABI WITH/WO TBI Referring Phys: GREGORY SCHNIER --------------------------------------------------------------------------------  High Risk  Factors: Hypertension, hyperlipidemia, Diabetes, no history of                    smoking, prior MI.  Vascular Interventions: Left below knee amputation 03/16/2022. Performing Technologist: Delorise Shiner RVT  Examination Guidelines: A complete evaluation includes at minimum, Doppler waveform signals and systolic blood pressure reading at the level of bilateral brachial, anterior tibial, and posterior tibial arteries, when vessel segments are accessible. Bilateral testing is considered an integral part of a complete examination. Photoelectric Plethysmograph (PPG) waveforms and toe systolic pressure readings are included as required and additional duplex testing as needed. Limited examinations for reoccurring indications may be performed as noted.  ABI Findings: +---------+------------------+-----+---------+--------+ Right    Rt Pressure (mmHg)IndexWaveform Comment  +---------+------------------+-----+---------+--------+ Brachial 157                                      +---------+------------------+-----+---------+--------+ ATA      174               1.09 triphasic         +---------+------------------+-----+---------+--------+ PTA      204               1.28 triphasic         +---------+------------------+-----+---------+--------+ Great Toe139               0.87                   +---------+------------------+-----+---------+--------+ +--------+------------------+-----+--------+-------+ Left    Lt Pressure (mmHg)IndexWaveformComment +--------+------------------+-----+--------+-------+ [id number]                                    +--------+------------------+-----+--------+-------+ +-------+-----------+-----------+------------+------------+ ABI/TBIToday's ABIToday's TBIPrevious ABIPrevious TBI +-------+-----------+-----------+------------+------------+ Right  1.28       0.87                                 +-------+-----------+-----------+------------+------------+  Summary: Right: Resting right ankle-brachial  index is within normal range. The right toe-brachial index is normal. *See table(s) above for measurements and observations.  Electronically signed by Leotis Pain MD on 05/27/2022 at 8:52:00 AM.    Final    CT PELVIS W CONTRAST  Result Date: 05/15/2022 CLINICAL DATA:  Malfunctioning penile prosthesis. EXAM: CT PELVIS WITH CONTRAST TECHNIQUE: Multidetector CT imaging of the pelvis was performed using the standard protocol following the bolus administration of intravenous contrast. RADIATION DOSE REDUCTION: This exam was performed according to the departmental dose-optimization program which includes automated exposure control, adjustment of the mA and/or kV according to patient size and/or use of iterative reconstruction technique. CONTRAST:  174m OMNIPAQUE IOHEXOL 300 MG/ML  SOLN COMPARISON:  Feb 13, 2022. FINDINGS: Urinary Tract:  No abnormality visualized. Bowel:  Unremarkable visualized pelvic bowel loops. Vascular/Lymphatic: Atherosclerosis of visualized abdominal aorta is noted. No definite adenopathy is noted. Reproductive: 2 penile prosthesis reservoirs are noted anteriorly in the pelvis and appear grossly intact. Patient has reportedly undergone transurethral resection of bladder tumor. Other:  No hernia or ascites is noted. Musculoskeletal: No suspicious bone lesions identified. IMPRESSION: Two penile prosthesis reservoirs are noted anteriorly in the pelvis and appear grossly intact. No acute abnormality seen in the abdomen or pelvis. Electronically Signed   By: JMarijo ConceptionM.D.   On: 05/15/2022 10:10    Labs:  CBC: Recent Labs    03/24/22 0347 03/25/22 0443 03/26/22 0433 03/27/22 0518  WBC 22.5* 23.0* 21.8* 21.3*  HGB 8.2* 8.0* 7.8* 7.5*  HCT 27.0* 26.0* 25.5* 24.6*  PLT 643* 565* 529* 531*    COAGS: Recent Labs    03/11/22 1258 03/13/22 0114 03/24/22 0347 03/25/22 0443  03/26/22 0433 03/27/22 0518 03/28/22 0338  INR 1.2  --   --   --  2.0* 2.8* 2.9*  APTT 33   < > 63* 63* 64* 70*  --    < > = values in this interval not displayed.    BMP: Recent Labs    03/25/22 0443 03/26/22 0433 03/27/22 0518 03/28/22 0338  NA 134* 132* 132* 134*  K 4.4 4.8 4.6 4.7  CL 107 106 107 109  CO2 20* 20* 18* 19*  GLUCOSE 151* 142* 146* 121*  BUN 49* 42* 47* 45*  CALCIUM 7.9* 8.0* 7.9* 8.0*  CREATININE 2.02* 1.73* 1.89* 1.81*  GFRNONAA 34* 41* 37* 39*    LIVER FUNCTION TESTS: Recent Labs    11/21/21 1124 02/13/22 0744 03/11/22 1258 03/13/22 0114  BILITOT 0.7 0.9 0.9 0.7  AST 16 21 33 23  ALT 22 20 25 19   ALKPHOS 37* 47 48 43  PROT 6.0* 6.9 7.1 5.9*  ALBUMIN 3.3* 3.6 3.8 3.2*    TUMOR MARKERS: No results for input(s): "AFPTM", "CEA", "CA199", "CHROMGRNA" in the last 8760 hours.  Assessment and Plan:  76y/o M with history of multiple DVT/PE while on anti-coagulation found to have a JAK2 V6 52F mutation during hypercoagulable workup who presents today for a bone marrow aspiration/biopsy as part of work-up for myeloproliferative disease.  Risks and benefits of bone marrow aspiration/biopsy was discussed with the patient and/or patient's family including, but not limited to bleeding, infection, damage to adjacent structures or low yield requiring additional tests.  All of the questions were answered and there is agreement to proceed.  Consent signed and in chart.   Thank you for this interesting consult.  I greatly enjoyed meeting KKANEN MOTTOLAand look forward to participating in their care.  A copy  of this report was sent to the requesting provider on this date.  Electronically Signed: Joaquim Nam, PA-C 05/31/2022, 10:58 AM   I spent a total of 30 Minutes  in face to face in clinical consultation, greater than 50% of which was counseling/coordinating care for bone marrow aspiration/biopsy.

## 2022-06-01 ENCOUNTER — Other Ambulatory Visit: Payer: Self-pay

## 2022-06-01 ENCOUNTER — Ambulatory Visit
Admission: RE | Admit: 2022-06-01 | Discharge: 2022-06-01 | Disposition: A | Discharge status: Home / Self Care | Payer: Medicare HMO | Source: Ambulatory Visit | Attending: Oncology | Admitting: Oncology

## 2022-06-01 ENCOUNTER — Telehealth (INDEPENDENT_AMBULATORY_CARE_PROVIDER_SITE_OTHER): Payer: Self-pay | Admitting: Nurse Practitioner

## 2022-06-01 DIAGNOSIS — Z1589 Genetic susceptibility to other disease: Secondary | ICD-10-CM | POA: Diagnosis not present

## 2022-06-01 DIAGNOSIS — D471 Chronic myeloproliferative disease: Secondary | ICD-10-CM | POA: Diagnosis not present

## 2022-06-01 DIAGNOSIS — D630 Anemia in neoplastic disease: Secondary | ICD-10-CM | POA: Diagnosis not present

## 2022-06-01 DIAGNOSIS — D75839 Thrombocytosis, unspecified: Secondary | ICD-10-CM | POA: Diagnosis not present

## 2022-06-01 DIAGNOSIS — D72829 Elevated white blood cell count, unspecified: Secondary | ICD-10-CM | POA: Insufficient documentation

## 2022-06-01 DIAGNOSIS — D649 Anemia, unspecified: Secondary | ICD-10-CM | POA: Diagnosis not present

## 2022-06-01 DIAGNOSIS — D4989 Neoplasm of unspecified behavior of other specified sites: Secondary | ICD-10-CM | POA: Diagnosis not present

## 2022-06-01 LAB — CBC WITH DIFFERENTIAL/PLATELET
Abs Immature Granulocytes: 0.06 10*3/uL (ref 0.00–0.07)
Basophils Absolute: 0.1 10*3/uL (ref 0.0–0.1)
Basophils Relative: 1 %
Eosinophils Absolute: 0.3 10*3/uL (ref 0.0–0.5)
Eosinophils Relative: 3 %
HCT: 36.3 % — ABNORMAL LOW (ref 39.0–52.0)
Hemoglobin: 10.6 g/dL — ABNORMAL LOW (ref 13.0–17.0)
Immature Granulocytes: 1 %
Lymphocytes Relative: 24 %
Lymphs Abs: 2.9 10*3/uL (ref 0.7–4.0)
MCH: 24.7 pg — ABNORMAL LOW (ref 26.0–34.0)
MCHC: 29.2 g/dL — ABNORMAL LOW (ref 30.0–36.0)
MCV: 84.6 fL (ref 80.0–100.0)
Monocytes Absolute: 0.8 10*3/uL (ref 0.1–1.0)
Monocytes Relative: 7 %
Neutro Abs: 7.9 10*3/uL — ABNORMAL HIGH (ref 1.7–7.7)
Neutrophils Relative %: 64 %
Platelets: 831 10*3/uL — ABNORMAL HIGH (ref 150–400)
RBC: 4.29 MIL/uL (ref 4.22–5.81)
RDW: 16.2 % — ABNORMAL HIGH (ref 11.5–15.5)
WBC: 12.1 10*3/uL — ABNORMAL HIGH (ref 4.0–10.5)
nRBC: 0 % (ref 0.0–0.2)

## 2022-06-01 MED ORDER — SODIUM CHLORIDE 0.9 % IV SOLN: INTRAVENOUS | Status: DC

## 2022-06-01 MED ORDER — MIDAZOLAM HCL 2 MG/2ML IJ SOLN
INTRAMUSCULAR | Status: AC | PRN
  Administered 2022-06-01: 1 mg via INTRAVENOUS

## 2022-06-01 MED ORDER — FENTANYL CITRATE (PF) 100 MCG/2ML IJ SOLN
INTRAMUSCULAR | Status: AC
  Filled 2022-06-01: qty 2

## 2022-06-01 MED ORDER — HEPARIN SOD (PORK) LOCK FLUSH 100 UNIT/ML IV SOLN
INTRAVENOUS | Status: AC
  Filled 2022-06-01: qty 5

## 2022-06-01 MED ORDER — MIDAZOLAM HCL 2 MG/2ML IJ SOLN
INTRAMUSCULAR | Status: AC
  Filled 2022-06-01: qty 4

## 2022-06-01 MED ORDER — FENTANYL CITRATE (PF) 100 MCG/2ML IJ SOLN
INTRAMUSCULAR | Status: AC | PRN
  Administered 2022-06-01: 50 ug via INTRAVENOUS

## 2022-06-01 NOTE — Procedures (Signed)
Vascular and Interventional Radiology Procedure Note  Patient: Phillip [name] DOB: 1946-01-07 Medical Record Number: [id number] Note Date/Time: 06/01/22 8:56 AM   Performing Physician: Michaelle Birks, MD Assistant(s): None  Diagnosis: JAK-2 mutation  Procedure: BONE MARROW ASPIRATION and BIOPSY  Anesthesia: Conscious Sedation Complications: None Estimated Blood Loss: Minimal Specimens: Sent for Pathology  Findings:  Successful CT-guided bone marrow aspiration and biopsy A total of 2 cores were obtained. Hemostasis of the tract was achieved using Manual Pressure.  Plan: Bed rest for 2 hours.  See detailed procedure note with images in PACS. The patient tolerated the procedure well without incident or complication and was returned to Recovery in stable condition.    Michaelle Birks, MD [facility] Radiology   Pager. [place]

## 2022-06-01 NOTE — Telephone Encounter (Signed)
Mitch LVM stating [facility] did not get prescription for prosthetic leg for patient.   Please advise

## 2022-06-01 NOTE — Telephone Encounter (Signed)
sent 

## 2022-06-06 LAB — SURGICAL PATHOLOGY

## 2022-06-08 ENCOUNTER — Encounter (HOSPITAL_COMMUNITY): Payer: Self-pay | Admitting: Oncology

## 2022-06-09 DIAGNOSIS — Z23 Encounter for immunization: Secondary | ICD-10-CM | POA: Diagnosis not present

## 2022-06-09 DIAGNOSIS — D759 Disease of blood and blood-forming organs, unspecified: Secondary | ICD-10-CM | POA: Diagnosis not present

## 2022-06-09 DIAGNOSIS — Z89512 Acquired absence of left leg below knee: Secondary | ICD-10-CM | POA: Diagnosis not present

## 2022-06-09 DIAGNOSIS — R200 Anesthesia of skin: Secondary | ICD-10-CM | POA: Diagnosis not present

## 2022-06-10 DIAGNOSIS — Z8546 Personal history of malignant neoplasm of prostate: Secondary | ICD-10-CM | POA: Diagnosis not present

## 2022-06-10 DIAGNOSIS — N5237 Erectile dysfunction following prostate ablative therapy: Secondary | ICD-10-CM | POA: Diagnosis not present

## 2022-06-10 DIAGNOSIS — N3289 Other specified disorders of bladder: Secondary | ICD-10-CM | POA: Diagnosis not present

## 2022-06-10 DIAGNOSIS — N393 Stress incontinence (female) (male): Secondary | ICD-10-CM | POA: Diagnosis not present

## 2022-06-10 DIAGNOSIS — N281 Cyst of kidney, acquired: Secondary | ICD-10-CM | POA: Diagnosis not present

## 2022-06-10 DIAGNOSIS — R7989 Other specified abnormal findings of blood chemistry: Secondary | ICD-10-CM | POA: Diagnosis not present

## 2022-06-10 DIAGNOSIS — C679 Malignant neoplasm of bladder, unspecified: Secondary | ICD-10-CM | POA: Diagnosis not present

## 2022-06-10 DIAGNOSIS — E1169 Type 2 diabetes mellitus with other specified complication: Secondary | ICD-10-CM | POA: Diagnosis not present

## 2022-06-10 DIAGNOSIS — T83490D Other mechanical complication of penile (implanted) prosthesis, subsequent encounter: Secondary | ICD-10-CM | POA: Diagnosis not present

## 2022-06-15 ENCOUNTER — Ambulatory Visit: Payer: Medicare HMO | Admitting: Oncology

## 2022-06-20 ENCOUNTER — Encounter: Payer: Self-pay | Admitting: Oncology

## 2022-06-20 ENCOUNTER — Inpatient Hospital Stay: Payer: Medicare HMO

## 2022-06-20 ENCOUNTER — Inpatient Hospital Stay: Payer: Medicare HMO | Attending: Oncology | Admitting: Oncology

## 2022-06-20 DIAGNOSIS — Z7189 Other specified counseling: Secondary | ICD-10-CM | POA: Diagnosis not present

## 2022-06-20 DIAGNOSIS — Z8546 Personal history of malignant neoplasm of prostate: Secondary | ICD-10-CM | POA: Diagnosis not present

## 2022-06-20 DIAGNOSIS — C674 Malignant neoplasm of posterior wall of bladder: Secondary | ICD-10-CM | POA: Diagnosis not present

## 2022-06-20 DIAGNOSIS — Z86711 Personal history of pulmonary embolism: Secondary | ICD-10-CM | POA: Diagnosis not present

## 2022-06-20 DIAGNOSIS — D508 Other iron deficiency anemias: Secondary | ICD-10-CM | POA: Diagnosis not present

## 2022-06-20 DIAGNOSIS — D509 Iron deficiency anemia, unspecified: Secondary | ICD-10-CM | POA: Diagnosis not present

## 2022-06-20 DIAGNOSIS — Z8551 Personal history of malignant neoplasm of bladder: Secondary | ICD-10-CM | POA: Insufficient documentation

## 2022-06-20 DIAGNOSIS — Z7901 Long term (current) use of anticoagulants: Secondary | ICD-10-CM | POA: Diagnosis not present

## 2022-06-20 DIAGNOSIS — D471 Chronic myeloproliferative disease: Secondary | ICD-10-CM | POA: Diagnosis not present

## 2022-06-20 DIAGNOSIS — I82409 Acute embolism and thrombosis of unspecified deep veins of unspecified lower extremity: Secondary | ICD-10-CM

## 2022-06-20 DIAGNOSIS — Z86718 Personal history of other venous thrombosis and embolism: Secondary | ICD-10-CM | POA: Diagnosis not present

## 2022-06-20 DIAGNOSIS — Z1589 Genetic susceptibility to other disease: Secondary | ICD-10-CM

## 2022-06-20 LAB — COMPREHENSIVE METABOLIC PANEL
ALT: 15 U/L (ref 0–44)
AST: 16 U/L (ref 15–41)
Albumin: 3.6 g/dL (ref 3.5–5.0)
Alkaline Phosphatase: 57 U/L (ref 38–126)
Anion gap: 3 — ABNORMAL LOW (ref 5–15)
BUN: 18 mg/dL (ref 8–23)
CO2: 25 mmol/L (ref 22–32)
Calcium: 8.5 mg/dL — ABNORMAL LOW (ref 8.9–10.3)
Chloride: 112 mmol/L — ABNORMAL HIGH (ref 98–111)
Creatinine, Ser: 1.15 mg/dL (ref 0.61–1.24)
GFR, Estimated: >60 mL/min (ref >60)
Glucose, Bld: 189 mg/dL — ABNORMAL HIGH (ref 70–99)
Potassium: 4 mmol/L (ref 3.5–5.1)
Sodium: 140 mmol/L (ref 135–145)
Total Bilirubin: 0.5 mg/dL (ref 0.3–1.2)
Total Protein: 6.9 g/dL (ref 6.5–8.1)

## 2022-06-20 LAB — CBC WITH DIFFERENTIAL/PLATELET
Abs Immature Granulocytes: 0.05 10*3/uL (ref 0.00–0.07)
Basophils Absolute: 0.1 10*3/uL (ref 0.0–0.1)
Basophils Relative: 1 %
Eosinophils Absolute: 0.2 10*3/uL (ref 0.0–0.5)
Eosinophils Relative: 3 %
HCT: 40.2 % (ref 39.0–52.0)
Hemoglobin: 11.9 g/dL — ABNORMAL LOW (ref 13.0–17.0)
Immature Granulocytes: 1 %
Lymphocytes Relative: 20 %
Lymphs Abs: 1.9 10*3/uL (ref 0.7–4.0)
MCH: 25.3 pg — ABNORMAL LOW (ref 26.0–34.0)
MCHC: 29.6 g/dL — ABNORMAL LOW (ref 30.0–36.0)
MCV: 85.4 fL (ref 80.0–100.0)
Monocytes Absolute: 0.6 10*3/uL (ref 0.1–1.0)
Monocytes Relative: 6 %
Neutro Abs: 6.6 10*3/uL (ref 1.7–7.7)
Neutrophils Relative %: 69 %
Platelets: 777 10*3/uL — ABNORMAL HIGH (ref 150–400)
RBC: 4.71 MIL/uL (ref 4.22–5.81)
RDW: 19 % — ABNORMAL HIGH (ref 11.5–15.5)
WBC: 9.4 10*3/uL (ref 4.0–10.5)
nRBC: 0 % (ref 0.0–0.2)

## 2022-06-20 LAB — IRON AND TIBC
Iron: 39 ug/dL — ABNORMAL LOW (ref 45–182)
Saturation Ratios: 12 % — ABNORMAL LOW (ref 17.9–39.5)
TIBC: 330 ug/dL (ref 250–450)
UIBC: 291 ug/dL

## 2022-06-20 LAB — FERRITIN: Ferritin: 10 ng/mL — ABNORMAL LOW (ref 24–336)

## 2022-06-20 MED ORDER — HYDROXYUREA 500 MG PO CAPS: 500.0000 mg | ORAL_CAPSULE | Freq: Every day | ORAL | 0 refills | Status: DC

## 2022-06-20 NOTE — Assessment & Plan Note (Addendum)
Recurrent DVT and embolism Continue long term anticoagulation with Eliquis '5mg'$  BID [ patient gets refills through PCP's office]. She is also on Plavix I recommend bridging perioperatively if he needs to have any elective procedure.

## 2022-06-20 NOTE — Assessment & Plan Note (Signed)
Discussed with patient

## 2022-06-20 NOTE — Assessment & Plan Note (Addendum)
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  Recommend Hydroxyurea 575m daily. Rational and potential side effects were reviewed with patient and he agrees with the plan.

## 2022-06-20 NOTE — Assessment & Plan Note (Signed)
Diagnosed in 2013,treated with cryoablation.  Follow up with urology

## 2022-06-20 NOTE — Assessment & Plan Note (Addendum)
Non invasive bladder cancer s/p  tx'd with TURBT in 2020 and then BCG, follow up with urology 

## 2022-06-20 NOTE — Progress Notes (Signed)
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  Recommend Hydroxyurea 576m daily. Rational and potential side effects were reviewed with patient and he agrees with the plan.    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 She is also on Plavix I recommend bridging perioperatively if he needs to have any elective procedure.  [place]  tx'd with TURBT in 2020 and then BCG, follow up with urology  History of prostate cancer Diagnosed in 2013,treated with cryoablation.  Follow up with urology  Iron deficiency anemia Continue oral iron supplementation. Option of IV venofer can be considered as well.   Goals of care, counseling/discussion Discussed with patient.   Orders Placed This Encounter  Procedures   CBC with Differential/Platelet    Standing Status:   Future    Number of Occurrences:   1    Standing Expiration Date:   06/21/2023   Comprehensive metabolic panel    Standing Status:   Future    Number of Occurrences:   1    Standing Expiration Date:   06/20/2023   Iron and TIBC    Standing Status:   Future    Number of Occurrences:   1    Standing Expiration Date:   06/21/2023   Ferritin    Standing Status:   Future    Number of Occurrences:   1    Standing Expiration Date:   06/21/2023   CBC with Differential/Platelet    Standing Status:   Standing    Number of Occurrences:   2    Standing Expiration Date:   06/21/2023   Comprehensive  metabolic panel    Standing Status:   Standing    Number of Occurrences:   2    Standing Expiration Date:   06/21/2023   Follow up  2 weeks lab 4 weeks lab MD  All questions were answered. The patient knows to call the clinic with any problems, questions or concerns.  ZhEarlie ServerMD, PhD [facility] 06/20/2022    CHIEF COMPLAINTS/REASON FOR VISIT:  DVT/PE  Pertinent hematology oncology history   Patient has significant history of previous DVT and PE.   Patient was seen by me in March 2020 and declined further follow-up. 2013 history of prostate cancer treated with cryo therapy  01/03/2016 history of superficial venous thrombosis demonstrated in the greater saphenous vein-.  Also history of superficial thrombophlebitis of segment  of left the basal leg vein in the upper arm-08/24/2017.  No anticoagulation was recommended at that time due to the SVT features. 11/25/2018, right lower extremity acute DVT, started on Lovenox for about a week, 12/05/2018, patient underwent TURBT and intravesical instillation of gemcitabine for noninvasive low-grade papillary urothelial carcinoma.  Lovenox was held for 2 days prior and for 24 hours after procedure, and also started on Eliquis 12/15/2018, developed acute nonocclusive segmental and subsegmental pulmonary emboli within the right upper lobe, right middle lobe, right lower lobe pulmonary branches.  Patient was admitted to the hospital and started on heparin drip.  Patient was seen by me during his admission at that time.  Lovenox 1 mg/kg twice daily was recommended at discharge.  He declined further follow-up with me in the clinic.   His anticoagulation was managed by primary care provider Dr. Sabra Heck.  Patient was on Coumadin for anticoagulation until Feb 2023.  Patient did not check INR since September 2022.  Patient also developed supratherapeutic INR due to interaction between Coumadin and Diflucan for treatment of esophageal  candidiasis.  Patient was off Coumadin on 11/17/2021.  11/21/2021 developed acute nonocclusive DVT, started on Lovenox decision was made to switch to Eliquis.   03/09/2022, artificial urinary sphincter placement.  His Eliquis was held for 1 week prior to the procedure.  He has noticed left lower extremity pain 1 to 2 days after holding Eliquis. 03/11/2022, ER visit due to progressively worsening left lower extremity pain. CT angio aortobifemoral with and without contrast showed eccentric thrombus in the left outflow vessels with distal occlusion of left renal vessels.  Delayed filling of the right popliteal artery is concerning for underlying occlusions and thromboembolic disease in the right runoff vessels Started on heparin drip in ED. 03/11/2022, vascular surgeon lower extremity bypass surgery 03/12/2022, severe left lower extremity pain, return to the OR for left lower extremity thrombectomy.  Continued on heparin drip. 03/16/2022, left lower extremity extremity nonsalvageable.  Status post BKA.  Per my discussion with pharmacy, heparin was held around 9 AM on 03/16/2022 and resumed on 03/17/2022 9:30 PM. . 03/17/2022, acute respiratory failure, CT chest angiogram showed bilateral central pulm emboli involving the main pulmonary arteries and extending into all lobar branches with nearly occlusive clot to the right lower lobe, left upper lobe, several segmental branches.  CT evidence of right heart strain.Mildly increased sclerotic focus    03/18/2022, thrombolysis, mechanical thrombectomy.  Heparin gtt restarted after procedure. 03/19/2022, heparin gtt was continued. 03/19/2022, bilateral lower extremity ultrasound showed thrombosis of right femoral popliteal and calf veins.  Partially thrombosed left profunda femoris vein. 03/19/2022 IVC filter placement. 03/20/2022, still have significant hypoxia.  On heparin gtt. 03/21/2022 -03/22/2022, on  heparin gtt. 03/23/2022, CT angio chest.showed increased clot burden within  right pulmonary artery and the right lower lobe pulmonary emboli with similar clot burden in the right middle lobe, right upper lobe, left lower lobe.  Single residual fibrin strand/clot at the bifurcation of the pulmonary artery with decreased clot burden in the left upper lobe artery branches.  Persistent right heart strain.  Findings suspicious for developing pulmonary infarcts. 03/23/2022, mechanical embolectomy of pulmonary arteries as well as right external iliac vein, common iliac vein and inferior vena cava.   Given that patient developed recurrent PE/DVT despite being therapeutic on heparin GTT, post thrombectomy, patient was switched to Angiomax and later started on Argatroban drip  Patient had  hypercoagulable work-up done during the admission. Work-up showed negative factor V Leiden mutation, prothrombin gene imaging.  Negative anticardiolipin antibodies, lupus anticoagulants.  Negative beta-2 glycoprotein antibodies Positive JAK2 V6 93F mutation   Patient was discharged on Eliquis.  He reports feeling well.  He denies shortness of breath, leg swelling and tenderness.  Left lower extremity BKA wound heals well.   INTERVAL HISTORY Phillip [name] is a 76 y.o. male who has above history reviewed by me today presents for follow up visit for  Recurrent thrombosis, Jak2 V693F mutation myeloproliferative disease He takes Eliquis and plavix, tolerates well.  He had bone marrow biopsy and presents to discuss results.   06/01/22 Bone  marrow biopsy showed  BONE MARROW, ASPIRATE, CLOT, CORE:  -Hypercellular marrow (60%) involved by a JAK2 mutated myeloproliferative neoplasm (see comment) Comment: The overall findings are consistent with a myeloproliferative neoplasm. Diagnostic considerations include essential thrombocythemia, polycythemia vera and pre- fibrotic primary myelofibrosis. Importantly, there is no overt increase in blasts or fibrosis.  However, correlation with erythropoietin  levels, iron studies, presence/absence of splenomegaly and serum LDH along with cytogenetic studies would be  helpful for a more definitive assessment.   PERIPHERAL BLOOD:   -Thrombocytosis  - Mild leukocytosis  - Normocytic normochromic anemia   Cytogenetics normal  MEDICAL HISTORY:  Past Medical History:  Diagnosis Date   Arthritis    Basal cell carcinoma    L clavicle, L prox forearm- removed years ago    Bladder cancer (HCC)    Bladder neck contracture    CAD (coronary artery disease)    a. 12/2020 NSTEMI/Cath: LM nl, LAD 95ost/p, LCX 50p, RCA nl. EF 45-50%.   Cataract    CKD (chronic kidney disease), stage III [facility])    ED (erectile dysfunction)    Frequency    GERD (gastroesophageal reflux disease)    History of pulmonary embolus (PE) 11/2018   a. Following LE DVT-->Chronic warfarin.   Hyperlipidemia LDL goal <70    Hypertension    Hypothyroidism    Incontinence of urine    sui, s/p cryoablation   Ischemic cardiomyopathy    a. 11/2018 Echo: EF 60-65%; b. 12/2020 LV Gram: EF 45-50% in setting of NSTEMI.   Kidney stones    Neuropathy    feet   Nocturia    Prostate cancer ([place])    S/P   CRYOABLATION   Right elbow tendinitis    Right Lower Extremity DVT (deep venous thrombosis) ([place]) 11/25/2018   Vertigo    1-2x/yr   Wears glasses     SURGICAL HISTORY: Past Surgical History:  Procedure Laterality Date   AMPUTATION Left 03/16/2022   Procedure: AMPUTATION BELOW KNEE;  Surgeon: Katha Cabal, MD;  Location: [facility] ORS;  Service: Vascular;  Laterality: Left;   ARTHROPLASTY AND TENDON REPAIR LEFT THUMB  JAN 2014   CATARACT EXTRACTION W/PHACO Left 11/16/2020   Procedure: CATARACT EXTRACTION PHACO AND INTRAOCULAR LENS PLACEMENT (IOC) LEFT  7.09 00:56.4 12.6%;  Surgeon: Leandrew Koyanagi, MD;  Location: [place];  Service: Ophthalmology;  Laterality: Left;   CATARACT EXTRACTION W/PHACO Right 12/02/2020   Procedure: CATARACT EXTRACTION PHACO AND INTRAOCULAR  LENS PLACEMENT (IOC) RIGHT MALYUGIN 5.52 00:49.7 11.1%;  Surgeon: Leandrew Koyanagi, MD;  Location: [place];  Service: Ophthalmology;  Laterality: Right;   COLONOSCOPY     COLONOSCOPY WITH PROPOFOL N/A 08/09/2017   Procedure: COLONOSCOPY WITH PROPOFOL;  Surgeon: Manya Silvas, MD;  Location: [facility] ENDOSCOPY;  Service: Endoscopy;  Laterality: N/A;   CORONARY ANGIOGRAPHY N/A 12/28/2020   Procedure: CORONARY ANGIOGRAPHY;  Surgeon: Sherren Mocha, MD;  Location: [place] CV LAB;  Service: Cardiovascular;  Laterality: N/A;   CORONARY STENT INTERVENTION N/A 12/28/2020   Procedure: CORONARY STENT INTERVENTION;  Surgeon: Sherren Mocha, MD;  Location: [place] CV LAB;  Service: Cardiovascular;  Laterality: N/A;   ESOPHAGOGASTRODUODENOSCOPY (EGD) WITH PROPOFOL N/A 08/09/2017   Procedure: ESOPHAGOGASTRODUODENOSCOPY (EGD) WITH PROPOFOL;  Surgeon: Manya Silvas, MD;  Location: [facility] ENDOSCOPY;  Service: Endoscopy;  Laterality: N/A;   GREEN LIGHT LASER TURP (TRANSURETHRAL RESECTION OF PROSTATE N/A 12/14/2015   Procedure: GREEN LIGHT LASER TURP (TRANSURETHRAL RESECTION OF PROSTATE) LASER OF BLADDER NECK CONTRACTURE;  Surgeon:  Carolan Clines, MD;  Location[facility];  Service: Urology;  Laterality: N/A;   IVC FILTER INSERTION N/A 03/19/2022   Procedure: IVC FILTER INSERTION;  Surgeon: Serafina Mitchell, MD;  Location: [place] CV LAB;  Service: Cardiovascular;  Laterality: N/A;   LEFT HEART CATH AND CORONARY ANGIOGRAPHY N/A 12/24/2020   Procedure: LEFT HEART CATH AND CORONARY ANGIOGRAPHY and possible PCI and stent;  Surgeon: Yolonda Kida, MD;  Location: [place] CV LAB;  Service: Cardiovascular;  Laterality: N/A;   LEFT HEART CATH AND CORONARY ANGIOGRAPHY N/A 01/06/2021   Procedure: LEFT HEART CATH AND CORONARY ANGIOGRAPHY;  Surgeon: Troy Sine, MD;  Location: [place] CV LAB;  Service: Cardiovascular;  Laterality: N/A;   LOWER EXTREMITY  ANGIOGRAPHY Left 03/11/2022   Procedure: Lower Extremity Angiography;  Surgeon: Katha Cabal, MD;  Location: [place] CV LAB;  Service: Cardiovascular;  Laterality: Left;   PENILE PROSTHESIS IMPLANT N/A 12/06/2013   Procedure: PENILE PROTHESIS INFLATABLE;  Surgeon: Ailene Rud, MD;  Location: [facility];  Service: Urology;  Laterality: N/A;   PROSTATE CRYOABLATION  06-01-2012  [facility]   PULMONARY THROMBECTOMY N/A 03/18/2022   Procedure: PULMONARY THROMBECTOMY;  Surgeon: Algernon Huxley, MD;  Location: [place] CV LAB;  Service: Cardiovascular;  Laterality: N/A;   PULMONARY THROMBECTOMY Bilateral 03/23/2022   Procedure: PULMONARY THROMBECTOMY;  Surgeon: Katha Cabal, MD;  Location: [place] CV LAB;  Service: Cardiovascular;  Laterality: Bilateral;   THROMBECTOMY ILIAC ARTERY Left 03/12/2022   Procedure: THROMBECTOMY LEG;  Surgeon: Conrad Perrysburg, MD;  Location: [facility] ORS;  Service: Vascular;  Laterality: Left;   TRANSURETHRAL RESECTION OF BLADDER NECK N/A 03/20/2015   Procedure: RELEASE BLADDER NECK CONTRACTURE  WITH WOLF BUTTON ELECTRODE ;  Surgeon: Carolan Clines, MD;  Location: [place];  Service: Urology;  Laterality: N/A;   TRANSURETHRAL RESECTION OF BLADDER TUMOR N/A 12/05/2018   Procedure: TRANSURETHRAL RESECTION OF BLADDER TUMOR (TURBT)/CYSTOSCOPY/  INSTILLATION OF Rodell Perna;  Surgeon: Ceasar Mons, MD;  Location[facility];  Service: Urology;  Laterality: N/A;   TRANSURETHRAL RESECTION OF BLADDER TUMOR N/A 01/15/2020   Procedure: TRANSURETHRAL RESECTION OF BLADDER TUMOR (TURBT)/ CYSTOSCOPY;  Surgeon: Ceasar Mons, MD;  Location: [facility];  Service: Urology;  Laterality: N/A;   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      SOCIAL HISTORY: Social History   Socioeconomic History   Marital status: Married    Spouse name: Anwar Sakata   Number of  children: 3   Years of education: Not on file   Highest education level: Not on file  Occupational History   Not on file  Tobacco Use   Smoking status: Never   Smokeless tobacco: Never  Vaping Use   Vaping Use: Never used  Substance and Sexual Activity   Alcohol use: No   Drug use: No   Sexual activity: Not Currently    Birth control/protection: None  Other Topics Concern   Not on file  Social History Narrative   Lives locally with wife.  Exercises regularly - gym/golf.  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   3 sons: 1 is a Marine scientist.    Social Determinants of Health   Financial Resource Strain: Not on file  Food Insecurity: Not on file  Transportation Needs: Not on file  Physical Activity: Not on file  Stress: Not on file  Social  Connections: Not on file  Intimate Partner Violence: Not on file    FAMILY HISTORY: Family History  Problem Relation Age of Onset   CAD Father 28    ALLERGIES:  is allergic to doxazosin.  MEDICATIONS:  Current Outpatient Medications  Medication Sig Dispense Refill   acetaminophen (TYLENOL) 500 MG tablet Take 500 mg by mouth every 6 (six) hours as needed.     apixaban (ELIQUIS) 5 MG TABS tablet Take 2 tablets (10 mg total) by mouth 2 (two) times daily for 9 doses. Start in evening 03/28/2022 (had AM dose is hospital) 18 tablet 0   ascorbic acid (VITAMIN C) 500 MG tablet Take 500 mg by mouth daily.     baclofen (LIORESAL) 10 MG tablet Take 10 mg by mouth 2 (two) times daily.     cetirizine (ZYRTEC) 10 MG tablet Take 10 mg by mouth daily.     clopidogrel (PLAVIX) 75 MG tablet Take 1 tablet (75 mg total) by mouth daily with breakfast. 90 tablet 3   co-enzyme Q-10 30 MG capsule Take 30 mg by mouth daily.     Glucosamine-Chondroit-Vit C-Mn (GLUCOSAMINE CHONDR 1500 COMPLX PO) Take 1 tablet by mouth every evening.     hydroxyurea (HYDREA) 500 MG capsule Take 1 capsule (500 mg total) by mouth daily. May take with food  to minimize GI side effects. 30 capsule 0   levothyroxine (SYNTHROID) 50 MCG tablet Take 50 mcg by mouth.     melatonin 5 MG TABS Take 1 tablet (5 mg total) by mouth at bedtime. 30 tablet 0   metoprolol tartrate (LOPRESSOR) 25 MG tablet Take 0.5 tablets (12.5 mg total) by mouth 2 (two) times daily. 60 tablet 0   Multiple Vitamin (MULTIVITAMIN) tablet Take 1 tablet by mouth daily.     pantoprazole (PROTONIX) 40 MG tablet Take 1 tablet (40 mg total) by mouth daily. 90 tablet 3   pravastatin (PRAVACHOL) 40 MG tablet Take 40 mg by mouth daily.     Turmeric (QC TUMERIC COMPLEX PO) Take by mouth.     apixaban (ELIQUIS) 5 MG TABS tablet Take 1 tablet (5 mg total) by mouth 2 (two) times daily. 60 tablet 0   nitroGLYCERIN (NITROSTAT) 0.4 MG SL tablet Place 1 tablet (0.4 mg total) under the tongue every 5 (five) minutes x 3 doses as needed for chest pain. (Patient not taking: Reported on 05/19/2022) 25 tablet 3   polyethylene glycol (MIRALAX / GLYCOLAX) 17 g packet Take 17 g by mouth 2 (two) times daily as needed for moderate constipation or mild constipation. (Patient not taking: Reported on 05/19/2022) 14 each 0   traMADol (ULTRAM) 50 MG tablet Take 50 mg by mouth every 6 (six) hours as needed for mild pain. (Patient not taking: Reported on 05/19/2022)     No current facility-administered medications for this visit.    Review of Systems  Constitutional:  Negative for appetite change, chills, fatigue, fever and unexpected weight change.  HENT:   Negative for hearing loss and voice change.   Eyes:  Negative for eye problems and icterus.  Respiratory:  Negative for chest tightness, cough and shortness of breath.   Cardiovascular:  Negative for chest pain and leg swelling.  Gastrointestinal:  Negative for abdominal distention and abdominal pain.  Endocrine: Negative for hot flashes.  Genitourinary:  Negative for difficulty urinating, dysuria and frequency.   Musculoskeletal:  Negative for arthralgias.        Left BKA  Skin:  Negative for itching  and rash.  Neurological:  Negative for light-headedness and numbness.  Hematological:  Negative for adenopathy. Does not bruise/bleed easily.  Psychiatric/Behavioral:  Negative for confusion.    PHYSICAL EXAMINATION:  Vitals:   06/20/22 1013  BP: 134/79  Pulse: 70  Resp: 18  Temp: 98 F (36.7 C)   Filed Weights   06/20/22 1013  Weight: 159 lb 1.6 oz (72.2 kg)    Physical Exam Constitutional:      General: He is not in acute distress. HENT:     Head: Normocephalic and atraumatic.  Eyes:     General: No scleral icterus. Cardiovascular:     Rate and Rhythm: Normal rate and regular rhythm.     Heart sounds: Normal heart sounds.  Pulmonary:     Effort: Pulmonary effort is normal. No respiratory distress.     Breath sounds: No wheezing.  Abdominal:     General: Bowel sounds are normal. There is no distension.     Palpations: Abdomen is soft.  Musculoskeletal:        General: Normal range of motion.     Cervical back: Normal range of motion and neck supple.     Comments: Status post left BKA  Skin:    General: Skin is warm and dry.     Findings: No erythema or rash.  Neurological:     Mental Status: He is alert and oriented to person, place, and time. Mental status is at baseline.     Cranial Nerves: No cranial nerve deficit.     Coordination: Coordination normal.  Psychiatric:        Mood and Affect: Mood normal.     LABORATORY DATA:  I have reviewed the data as listed    Latest Ref Rng & Units 06/20/2022   10:57 AM 06/01/2022    7:54 AM 03/27/2022    5:18 AM  CBC  WBC 4.0 - 10.5 K/uL 9.4  12.1  21.3   Hemoglobin 13.0 - 17.0 g/dL 11.9  10.6  7.5   Hematocrit 39.0 - 52.0 % 40.2  36.3  24.6   Platelets 150 - 400 K/uL 777  831  531       Latest Ref Rng & Units 06/20/2022   10:57 AM 03/28/2022    3:38 AM 03/27/2022    5:18 AM  CMP  Glucose 70 - 99 mg/dL 189  121  146   BUN 8 - 23 mg/dL 18  45  47   Creatinine 0.61 - 1.24  mg/dL 1.15  1.81  1.89   Sodium 135 - 145 mmol/L 140  134  132   Potassium 3.5 - 5.1 mmol/L 4.0  4.7  4.6   Chloride 98 - 111 mmol/L 112  109  107   CO2 22 - 32 mmol/L 25  19  18    Calcium 8.9 - 10.3 mg/dL 8.5  8.0  7.9   Total Protein 6.5 - 8.1 g/dL 6.9     Total Bilirubin 0.3 - 1.2 mg/dL 0.5     Alkaline Phos 38 - 126 U/L 57     AST 15 - 41 U/L 16     ALT 0 - 44 U/L 15         RADIOGRAPHIC STUDIES: I have personally reviewed the radiological images as listed and agreed with the findings in the report. CT BONE MARROW BIOPSY & ASPIRATION  Result Date: 06/01/2022 INDICATION: JAK2 mutation. EXAM: CT GUIDED BONE MARROW ASPIRATION AND CORE BIOPSY MEDICATIONS: None. ANESTHESIA/SEDATION: Moderate (  conscious) sedation was employed during this procedure. A total of Versed 1 mg and Fentanyl 50 mcg was administered intravenously. Moderate Sedation Time: 20 minutes. The patient's level of consciousness and vital signs were monitored continuously by radiology nursing throughout the procedure under my direct supervision. FLUOROSCOPY TIME:  CT dose in mGy was not provided. COMPLICATIONS: None immediate. Estimated blood loss: <5 mL PROCEDURE: RADIATION DOSE REDUCTION: This exam was performed according to the departmental dose-optimization program which includes automated exposure control, adjustment of the mA and/or kV according to patient size and/or use of iterative reconstruction technique. Informed written consent was obtained from the patient after a thorough discussion of the procedural risks, benefits and alternatives. All questions were addressed. Maximal Sterile Barrier Technique was utilized including caps, mask, sterile gowns, sterile gloves, sterile drape, hand hygiene and skin antiseptic. A timeout was performed prior to the initiation of the procedure. The patient was positioned prone and non-contrast localization CT was performed of the pelvis to demonstrate the iliac marrow spaces. Maximal  barrier sterile technique utilized including caps, mask, sterile gowns, sterile gloves, large sterile drape, hand hygiene, and chlorhexidine prep. Under sterile conditions and local anesthesia, an 11 gauge coaxial bone biopsy needle was advanced into the RIGHT iliac marrow space. Needle position was confirmed with CT imaging. Initially, bone marrow aspiration was performed. Next, the 11 gauge outer cannula was utilized to obtain a 2 iliac bone marrow core biopsy. Needle was removed. Hemostasis was obtained with compression. The patient tolerated the procedure well. Samples were prepared with the cytotechnologist. IMPRESSION: Successful CT-guided bone marrow aspiration and biopsy, as above. Michaelle Birks, MD [facility] Radiology Electronically Signed   By: Michaelle Birks M.D.   On: 06/01/2022 10:51   VAS [place] ABI WITH/WO TBI  Result Date: 05/27/2022  LOWER EXTREMITY DOPPLER STUDY Patient Name:  ESTANISLADO SURGEON  Date of Exam:   05/20/2022 Medical Rec #: [id number]         Accession #:    [id number] Date of Birth: [birth year]         Patient Gender: M Patient Age:   5 years Exam Location:  [facility] Procedure:      VAS [place] ABI WITH/WO TBI Referring Phys: GREGORY SCHNIER --------------------------------------------------------------------------------  High Risk Factors: Hypertension, hyperlipidemia, Diabetes, no history of                    smoking, prior MI.  Vascular Interventions: Left below knee amputation 03/16/2022. Performing Technologist: Delorise Shiner RVT  Examination Guidelines: A complete evaluation includes at minimum, Doppler waveform signals and systolic blood pressure reading at the level of bilateral brachial, anterior tibial, and posterior tibial arteries, when vessel segments are accessible. Bilateral testing is considered an integral part of a complete examination. Photoelectric Plethysmograph (PPG) waveforms and toe systolic pressure  readings are included as required and additional duplex testing as needed. Limited examinations for reoccurring indications may be performed as noted.  ABI Findings: +---------+------------------+-----+---------+--------+ Right    Rt Pressure (mmHg)IndexWaveform Comment  +---------+------------------+-----+---------+--------+ Brachial 157                                      +---------+------------------+-----+---------+--------+ ATA      174               1.09 triphasic         +---------+------------------+-----+---------+--------+ PTA  204               1.28 triphasic         +---------+------------------+-----+---------+--------+ Great Toe139               0.87                   +---------+------------------+-----+---------+--------+ +--------+------------------+-----+--------+-------+ Left    Lt Pressure (mmHg)IndexWaveformComment +--------+------------------+-----+--------+-------+ [id number]                                    +--------+------------------+-----+--------+-------+ +-------+-----------+-----------+------------+------------+ ABI/TBIToday's ABIToday's TBIPrevious ABIPrevious TBI +-------+-----------+-----------+------------+------------+ Right  1.28       0.87                                +-------+-----------+-----------+------------+------------+  Summary: Right: Resting right ankle-brachial index is within normal range. The right toe-brachial index is normal. *See table(s) above for measurements and observations.  Electronically signed by Leotis Pain MD on 05/27/2022 at 8:52:00 AM.    Final    CT PELVIS W CONTRAST  Result Date: 05/15/2022 CLINICAL DATA:  Malfunctioning penile prosthesis. EXAM: CT PELVIS WITH CONTRAST TECHNIQUE: Multidetector CT imaging of the pelvis was performed using the standard protocol following the bolus administration of intravenous contrast. RADIATION DOSE REDUCTION: This exam was performed according to the  departmental dose-optimization program which includes automated exposure control, adjustment of the mA and/or kV according to patient size and/or use of iterative reconstruction technique. CONTRAST:  123m OMNIPAQUE IOHEXOL 300 MG/ML  SOLN COMPARISON:  Feb 13, 2022. FINDINGS: Urinary Tract:  No abnormality visualized. Bowel:  Unremarkable visualized pelvic bowel loops. Vascular/Lymphatic: Atherosclerosis of visualized abdominal aorta is noted. No definite adenopathy is noted. Reproductive: 2 penile prosthesis reservoirs are noted anteriorly in the pelvis and appear grossly intact. Patient has reportedly undergone transurethral resection of bladder tumor. Other:  No hernia or ascites is noted. Musculoskeletal: No suspicious bone lesions identified. IMPRESSION: Two penile prosthesis reservoirs are noted anteriorly in the pelvis and appear grossly intact. No acute abnormality seen in the abdomen or pelvis. Electronically Signed   By: JMarijo ConceptionM.D.   On: 05/15/2022 10:10   VAS [place]OWER EXTREMITY VENOUS (DVT)  Result Date: 04/07/2022  Lower Venous DVT Study Patient Name:  KTAITUM ALMS Date of Exam:   04/06/2022 Medical Rec #: [id number]        Accession #:    [id number] of Birth: 221-Dec-1947        Patient Gender: M Patient Age:   89years Exam Location:  [facility] Procedure:      VAS [place]OWER EXTREMITY VENOUS (DVT) Referring Phys: GHortencia Pilar--------------------------------------------------------------------------------  Indications: Hx of DVT and PE.  Risk Factors: Surgery 03/11/2022: Lt Arterial Thrombectomy/Stent. 03/13/2022: Left Fem/Popliteal and Tibial Thrombectomy. 03/16/2022: Lt BKA. 03/19/2022: IVC Filter placement. 03/23/2022: Pulmonary Thrombectomy. Comparison Study: 03/19/2022 Performing Technologist: SAlmira CoasterRVS  Examination Guidelines: A complete evaluation includes B-mode imaging, spectral Doppler, color Doppler, and power Doppler as needed of all accessible  portions of each vessel. Bilateral testing is considered an integral part of a complete examination. Limited examinations for reoccurring indications may be performed as noted. The reflux portion of the exam is performed with the patient in reverse Trendelenburg.  +---------+---------------+---------+-----------+----------+--------------+ RIGHT    CompressibilityPhasicitySpontaneityPropertiesThrombus Aging +---------+---------------+---------+-----------+----------+--------------+ CFV  None           No       No                                  +---------+---------------+---------+-----------+----------+--------------+ SFJ      Full           Yes      Yes                                 +---------+---------------+---------+-----------+----------+--------------+ FV Prox  None           No       No                                  +---------+---------------+---------+-----------+----------+--------------+ FV Mid   None           No       No                                  +---------+---------------+---------+-----------+----------+--------------+ FV DistalNone           No       No                                  +---------+---------------+---------+-----------+----------+--------------+ PFV      None           No       No                                  +---------+---------------+---------+-----------+----------+--------------+ POP      None           No       No                                  +---------+---------------+---------+-----------+----------+--------------+ PTV      Full           No       No                                  +---------+---------------+---------+-----------+----------+--------------+ PERO     None           No       No                                  +---------+---------------+---------+-----------+----------+--------------+ GSV      Full           Yes      Yes                                  +---------+---------------+---------+-----------+----------+--------------+   +---------+---------------+---------+-----------+----------+--------------+ LEFT     CompressibilityPhasicitySpontaneityPropertiesThrombus Aging +---------+---------------+---------+-----------+----------+--------------+ CFV      None           No  No                                  +---------+---------------+---------+-----------+----------+--------------+ SFJ      Full           Yes      Yes                                 +---------+---------------+---------+-----------+----------+--------------+ FV Prox  None           No       No                                  +---------+---------------+---------+-----------+----------+--------------+ FV Mid   None           No       No                                  +---------+---------------+---------+-----------+----------+--------------+ FV DistalNone           No       No                                  +---------+---------------+---------+-----------+----------+--------------+ PFV      None           No       No                                  +---------+---------------+---------+-----------+----------+--------------+ Lt BKA.    Summary: RIGHT: - Findings consistent with acute deep vein thrombosis involving the right common femoral vein, right femoral vein, right proximal profunda vein, right popliteal vein, and right peroneal veins. - Incidental Note: Triphasic PTA, ATA and Peroneal Artery flow seen.  LEFT: - Findings consistent with acute deep vein thrombosis involving the left common femoral vein, left femoral vein, and left proximal profunda vein.  *See table(s) above for measurements and observations. Electronically signed by [name] MD on 04/07/2022 at 5:02:36 PM.    Final    PERIPHERAL VASCULAR CATHETERIZATION  Result Date: 03/23/2022 See surgical note for result.  CT Angio Chest Pulmonary Embolism (PE) W or WO  Contrast  Result Date: 03/23/2022 CLINICAL DATA:  pulmonary embolism, eval for possible repeat thrombectomy EXAM: CT ANGIOGRAPHY CHEST WITH CONTRAST TECHNIQUE: Multidetector CT imaging of the chest was performed using the standard protocol during bolus administration of intravenous contrast. Multiplanar CT image reconstructions and MIPs were obtained to evaluate the vascular anatomy. RADIATION DOSE REDUCTION: This exam was performed according to the departmental dose-optimization program which includes automated exposure control, adjustment of the mA and/or kV according to patient size and/or use of iterative reconstruction technique. CONTRAST:  66m OMNIPAQUE IOHEXOL 350 MG/ML SOLN COMPARISON:  CTA 03/17/2022, CTA 12/07/2018. FINDINGS: Cardiovascular: Satisfactory opacification of the pulmonary arteries. There is increased embolic burden within the right main and lower lobe pulmonary arteries. There are additional emboli in the right upper and middle lobar and segmental branches. Additional pulmonary emboli in the left lower lobar, segmental and subsegmental pulmonary arteries as well as segmental and subsegmental branches of the left upper lobe. There  is a thin residual strand of clot at the bifurcation of the main pulmonary artery into the right and left pulmonary arteries with overall decreased clot burden in the left upper lobe arteries. There is persistent evidence of right heart strain with RV: LV ratio measuring up to 1.6, and reflux of contrast into the IVC. Mediastinum/Nodes: No lymphadenopathy. Lungs/Pleura: There are increased peripheral ground-glass opacities in the upper lobes and superior segment of the right lower lobe. Bibasilar hypoventilatory changes. No pleural effusion. No pneumothorax. Upper Abdomen: No acute findings. Musculoskeletal: No acute osseous abnormality. There is a single 8 mm sclerotic lesion within the T4 vertebral body which is most likely a bone island, present since at least  March 2020. Review of the MIP images confirms the above findings. IMPRESSION: Increased clot burden within the right pulmonary artery and right lower lobe pulmonary emboli with similar clot burden in the right middle lobe, right upper lobe, and left lower lobe. Thin residual fiber strand/clot at the bifurcation of the pulmonary artery with decreased clot burden in the left upper lobe arterial branches. Persistent right heart strain with RV:LV ratio of 1.6 and reflux of contrast into the IVC. Findings are consistent with at least submassive (intermediate risk) PE. The presence of right heart strain has been associated with an increased risk of morbidity and mortality. Please refer to the "Code PE Focused" order set in EPIC. Increased peripheral ground-glass opacities in the upper lobes and superior segment of the right lower lobe, suspicious for developing pulmonary infarcts. Critical Value/emergent results were called by telephone at the time of interpretation on 03/23/2022 at 11:59 am to provider Emeterio Reeve , who verbally acknowledged these results. Electronically Signed   By: Maurine Simmering M.D.   On: 03/23/2022 12:02

## 2022-06-20 NOTE — Assessment & Plan Note (Signed)
Continue oral iron supplementation. Option of IV venofer can be considered as well.

## 2022-06-21 ENCOUNTER — Telehealth: Payer: Self-pay

## 2022-06-21 MED ORDER — VITRON-C 65-125 MG PO TABS: 1.0000 | ORAL_TABLET | Freq: Every day | ORAL | 1 refills | Status: DC

## 2022-06-21 NOTE — Telephone Encounter (Signed)
-----   Message from Earlie Server, MD sent at 06/20/2022  7:11 PM EDT ----- Please let patient know that his iron level is low. Recommend oral iron supplementation.  If he would like me to send a Rx, please send vitron c daily.

## 2022-06-21 NOTE — Telephone Encounter (Signed)
Pt informed and verbalized understanding. Rx for Vitron C sent to [facility].

## 2022-06-22 ENCOUNTER — Other Ambulatory Visit (INDEPENDENT_AMBULATORY_CARE_PROVIDER_SITE_OTHER): Payer: Self-pay | Admitting: Nurse Practitioner

## 2022-06-22 ENCOUNTER — Telehealth (INDEPENDENT_AMBULATORY_CARE_PROVIDER_SITE_OTHER): Payer: Self-pay | Admitting: Nurse Practitioner

## 2022-06-22 DIAGNOSIS — Z89512 Acquired absence of left leg below knee: Secondary | ICD-10-CM | POA: Diagnosis not present

## 2022-06-22 NOTE — Telephone Encounter (Signed)
sent 

## 2022-06-24 ENCOUNTER — Encounter: Payer: Self-pay | Admitting: Physical Therapy

## 2022-06-24 ENCOUNTER — Ambulatory Visit: Payer: Medicare HMO | Attending: Nurse Practitioner | Admitting: Physical Therapy

## 2022-06-24 DIAGNOSIS — Z89512 Acquired absence of left leg below knee: Secondary | ICD-10-CM | POA: Diagnosis not present

## 2022-06-24 DIAGNOSIS — R269 Unspecified abnormalities of gait and mobility: Secondary | ICD-10-CM | POA: Insufficient documentation

## 2022-06-24 DIAGNOSIS — M6281 Muscle weakness (generalized): Secondary | ICD-10-CM | POA: Insufficient documentation

## 2022-06-24 NOTE — Patient Instructions (Signed)
Access Code: [email or web address] URL: [email or web address] Date: 06/24/2022 Prepared by: Dorcas Carrow  Exercises - Supine Hip Extension with Towel Roll (BKA)  - 1 x daily - 5 x weekly - 1 sets - 20 reps - Sidelying Hip Abduction wtih Flexion and Extension (BKA)  - 1 x daily - 5 x weekly - 1 sets - 20 reps - Prone Glute Set with Block (BKA)  - 1 x daily - 5 x weekly - 1 sets - 10 reps - Prone Hip Extension with Residual Limb - Knee Bent (BKA)  - 1 x daily - 5 x weekly - 1 sets - 20 reps - Sit to Stand with Counter Support  - 1 x daily - 5 x weekly - 1 sets - 10 reps - Standing March with Counter Support  - 1 x daily - 5 x weekly - 1 sets - 20 reps

## 2022-06-25 NOTE — Therapy (Addendum)
OUTPATIENT PHYSICAL THERAPY LOWER EXTREMITY EVALUATION   Patient Name: Phillip [name] MRN: [id number] DOB:[birth year], 76 y.o., male Today's Date: 06/25/2022   PT End of Session - 06/25/22 1521     Visit Number 1    Number of Visits 9    Date for PT Re-Evaluation 08/19/22    PT Start Time 0810    PT Stop Time 0909    PT Time Calculation (min) 59 min    Activity Tolerance Patient tolerated treatment well             Past Medical History:  Diagnosis Date   Arthritis    Basal cell carcinoma    L clavicle, L prox forearm- removed years ago    Bladder cancer ([place])    Bladder neck contracture    CAD (coronary artery disease)    a. 12/2020 NSTEMI/Cath: LM nl, LAD 95ost/p, LCX 50p, RCA nl. EF 45-50%.   Cataract    CKD (chronic kidney disease), stage III [facility])    ED (erectile dysfunction)    Frequency    GERD (gastroesophageal reflux disease)    History of pulmonary embolus (PE) 11/2018   a. Following LE DVT-->Chronic warfarin.   Hyperlipidemia LDL goal <70    Hypertension    Hypothyroidism    Incontinence of urine    sui, s/p cryoablation   Ischemic cardiomyopathy    a. 11/2018 Echo: EF 60-65%; b. 12/2020 LV Gram: EF 45-50% in setting of NSTEMI.   Kidney stones    Neuropathy    feet   Nocturia    Prostate cancer ([place])    S/P   CRYOABLATION   Right elbow tendinitis    Right Lower Extremity DVT (deep venous thrombosis) ([place]) 11/25/2018   Vertigo    1-2x/yr   Wears glasses    Past Surgical History:  Procedure Laterality Date   AMPUTATION Left 03/16/2022   Procedure: AMPUTATION BELOW KNEE;  Surgeon: Katha Cabal, MD;  Location: [facility];  Service: Vascular;  Laterality: Left;   ARTHROPLASTY AND TENDON REPAIR LEFT THUMB  JAN 2014   CATARACT EXTRACTION W/PHACO Left 11/16/2020   Procedure: CATARACT EXTRACTION PHACO AND INTRAOCULAR LENS PLACEMENT (IOC) LEFT  7.09 00:56.4 12.6%;  Surgeon: Leandrew Koyanagi, MD;  Location: [place];  Service:  Ophthalmology;  Laterality: Left;   CATARACT EXTRACTION W/PHACO Right 12/02/2020   Procedure: CATARACT EXTRACTION PHACO AND INTRAOCULAR LENS PLACEMENT (IOC) RIGHT MALYUGIN 5.52 00:49.7 11.1%;  Surgeon: Leandrew Koyanagi, MD;  Location: [place];  Service: Ophthalmology;  Laterality: Right;   COLONOSCOPY     COLONOSCOPY WITH PROPOFOL N/A 08/09/2017   Procedure: COLONOSCOPY WITH PROPOFOL;  Surgeon: Manya Silvas, MD;  Location: [facility] ENDOSCOPY;  Service: Endoscopy;  Laterality: N/A;   CORONARY ANGIOGRAPHY N/A 12/28/2020   Procedure: CORONARY ANGIOGRAPHY;  Surgeon: Sherren Mocha, MD;  Location: [place] CV LAB;  Service: Cardiovascular;  Laterality: N/A;   CORONARY STENT INTERVENTION N/A 12/28/2020   Procedure: CORONARY STENT INTERVENTION;  Surgeon: Sherren Mocha, MD;  Location: [place] [facility];  Service: Cardiovascular;  Laterality: N/A;   ESOPHAGOGASTRODUODENOSCOPY (EGD) WITH PROPOFOL N/A 08/09/2017   Procedure: ESOPHAGOGASTRODUODENOSCOPY (EGD) WITH PROPOFOL;  Surgeon: Manya Silvas, MD;  Location: [facility];  Service: Endoscopy;  Laterality: N/A;   GREEN LIGHT LASER TURP (TRANSURETHRAL RESECTION OF PROSTATE N/A 12/14/2015   Procedure: GREEN LIGHT LASER TURP (TRANSURETHRAL RESECTION OF PROSTATE) LASER OF BLADDER NECK CONTRACTURE;  Surgeon: Carolan Clines, MD;  Location: [place];  Service: Urology;  Laterality: N/A;   IVC FILTER INSERTION N/A 03/19/2022   Procedure: IVC FILTER INSERTION;  Surgeon: Serafina Mitchell, MD;  Location: [place] [facility];  Service: Cardiovascular;  Laterality: N/A;   LEFT HEART CATH AND CORONARY ANGIOGRAPHY N/A 12/24/2020   Procedure: LEFT HEART CATH AND CORONARY ANGIOGRAPHY and possible PCI and stent;  Surgeon: Yolonda Kida, MD;  Location: [place] [facility];  Service: Cardiovascular;  Laterality: N/A;   LEFT HEART CATH AND CORONARY ANGIOGRAPHY N/A 01/06/2021   Procedure: LEFT HEART CATH AND CORONARY  ANGIOGRAPHY;  Surgeon: Troy Sine, MD;  Location: [place] [facility];  Service: Cardiovascular;  Laterality: N/A;   LOWER EXTREMITY ANGIOGRAPHY Left 03/11/2022   Procedure: Lower Extremity Angiography;  Surgeon: Katha Cabal, MD;  Location: [place] [facility];  Service: Cardiovascular;  Laterality: Left;   PENILE PROSTHESIS IMPLANT N/A 12/06/2013   Procedure: PENILE PROTHESIS INFLATABLE;  Surgeon: Ailene Rud, MD;  Location: [facility];  Service: Urology;  Laterality: N/A;   PROSTATE CRYOABLATION  06-01-2012  [facility]   PULMONARY THROMBECTOMY N/A 03/18/2022   Procedure: PULMONARY THROMBECTOMY;  Surgeon: Algernon Huxley, MD;  Location: [place] [facility];  Service: Cardiovascular;  Laterality: N/A;   PULMONARY THROMBECTOMY Bilateral 03/23/2022   Procedure: PULMONARY THROMBECTOMY;  Surgeon: Katha Cabal, MD;  Location: [place] [facility];  Service: Cardiovascular;  Laterality: Bilateral;   THROMBECTOMY ILIAC ARTERY Left 03/12/2022   Procedure: THROMBECTOMY LEG;  Surgeon: Conrad [place], MD;  Location: [facility];  Service: Vascular;  Laterality: Left;   TRANSURETHRAL RESECTION OF BLADDER NECK N/A 03/20/2015   Procedure: RELEASE BLADDER NECK CONTRACTURE  WITH WOLF BUTTON ELECTRODE ;  Surgeon: Carolan Clines, MD;  Location: [place];  Service: Urology;  Laterality: N/A;   TRANSURETHRAL RESECTION OF BLADDER TUMOR N/A 12/05/2018   Procedure: TRANSURETHRAL RESECTION OF BLADDER TUMOR (TURBT)/CYSTOSCOPY/  INSTILLATION OF Rodell Perna;  Surgeon: Ceasar Mons, MD;  Location[facility];  Service: Urology;  Laterality: N/A;   TRANSURETHRAL RESECTION OF BLADDER TUMOR N/A 01/15/2020   Procedure: TRANSURETHRAL RESECTION OF BLADDER TUMOR (TURBT)/ CYSTOSCOPY;  Surgeon: Ceasar Mons, MD;  Location: [facility];  Service: Urology;  Laterality: N/A;   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      Patient Active Problem List   Diagnosis Date Noted   Iron deficiency anemia 06/20/2022   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   History of bladder cancer 05/19/2022   Goals of care, counseling/discussion 03/23/2022   Reactive thrombocytosis 03/21/2022   Paroxysmal atrial fibrillation with RVR ([place]) 03/20/2022   Acute bilateral deep vein thrombosis (DVT) of femoral veins (HCC) 03/20/2022   Acute respiratory failure with hypoxia (HCC) 03/16/2022   Acute on chronic diastolic CHF (congestive heart failure) ([place]) 03/16/2022   PSVT (paroxysmal supraventricular tachycardia) 03/16/2022   AKI (acute kidney injury) ([place]) 03/15/2022   Critical limb ischemia of left lower extremity with ulceration of lower leg ([place]) 03/15/2022   Arterial occlusion 03/11/2022   Elevated troponin    Unstable angina ([place]) 01/05/2021   Hyperlipidemia LDL goal <70    CKD (chronic kidney disease), stage III (HCC)    Coronary artery disease    NSTEMI (non-ST elevated myocardial infarction) ([place]) 12/23/2020   Acquired hypothyroidism 04/20/2020   Chemotherapy-induced neuropathy ([place]) 04/20/2020   Nonthrombocytopenic purpura ([place]) 12/03/2019   Hypercoagulable state ([place]) 12/10/2018   Recurrent deep vein thrombosis (DVT) (HCC)    Bilateral pulmonary  embolism ([place]) 12/07/2018   GERD (gastroesophageal reflux disease) 12/07/2018   HTN (hypertension) 12/07/2018   HLD (hyperlipidemia) 12/07/2018   Prostate cancer ([place]) 12/07/2018   Bladder cancer ([place]) 12/07/2018   DM type 2 with diabetic mixed hyperlipidemia ([place]) 04/04/2018   Pain in right hand 10/04/2017   Tubular adenoma 16/60/6301   Helicobacter pylori (H. pylori) infection 09/04/2017   Medicare annual wellness visit, initial 03/31/2017   History of prostate cancer 10/27/2014   Lower urinary tract symptoms (LUTS) 03/18/2012   Nephrolithiasis 03/18/2012    PCP: Rusty Aus, MD  REFERRING PROVIDER: Kris Hartmann, NP  REFERRING DIAG: Left below knee  amputee  THERAPY DIAG:  Left below-knee amputee [facility])  Gait difficulty  Muscle weakness (generalized)  Rationale for Evaluation and Treatment Rehabilitation  ONSET DATE: 03/16/22  SUBJECTIVE:   SUBJECTIVE STATEMENT: Pt. S/p R BKA after blood clots in lower leg.  Pt. Reports minimum phantom limb symptoms and occasional muscle spasms.  Pt. Takes Baclofen to manage symptoms.  Pt. Received prosthetic leg on Wednesday from [facility]  Pt. Has h/o low back pain and has received spinal epidurals in past (2 years ago).    PERTINENT HISTORY: 1. Hx of BKA, left [facility]) Mr. Kayon Dozier was seen for further evaluation for left below knee prosthesis.  The is a 76 year old male who had amputation on 03/16/2022.  At the time he is well healed and ready for fitting of new below knee prosthesis.  He is a highly motivated individual and should do well once fitted with prosthesis.  She has no problem returning to a K2 ambulator, which will allow him to walk inside and outside of his home and overload level barriers.    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  He will be having a bone marrow biopsy.  We will continue to have the patient keep his IVC filter for now as there may be further test.  We will have the patient return in 3 months to discuss filter removal.    PAIN:  Are you having pain? No  PRECAUTIONS: None  WEIGHT BEARING RESTRICTIONS No  FALLS:  Has patient fallen in last 6 months? No  LIVING ENVIRONMENT: Lives with: lives with their spouse Lives in: House/apartment Stairs: Yes: Internal: 13+ steps; on right going up Has following equipment at home: Single point cane and Walker - 2 wheeled  OCCUPATION: Retired  PLOF: [place] with walking and return to playing golf by spring.     OBJECTIVE:   PATIENT SURVEYS:  FOTO initial 40/ goal 76.    COGNITION:  Overall cognitive status: Within  functional limits for tasks assessed     SENSATION: WFL  EDEMA:  Circumferential: L/R knee joint line (34/36 cm.), calf (30.5/ 33 cm), distal quad (35/36 cm).    POSTURE: rounded shoulders and forward head  PALPATION: No tenderness/ good incision healing on residual limb  LOWER EXTREMITY ROM:  B LE WFL (all planes).  L knee 0-130 deg.   LOWER EXTREMITY MMT:  R LE muscle strength grossly 5/5 MMT except hip flexion 4+/5 MMT.  L LE strength grossly 5/5 MMT except hip flexion 4/5 MMT and hip abduction 4+/5 MMT.    FUNCTIONAL TESTS:  5 times sit to stand: 14.1 sec. And requires R UE assist.  Able to stand from chair with addition of 2" Airex pad.    GAIT: Distance walked: in clinic/ //-bars Assistive device utilized:  Walker - 2 wheeled Level of assistance: Min A Comments: Ambulates in clinic with RW and min. A/ cuing for proper technique and step pattern.  Pt. Able to ambulate in //-bars with R UE assist only and 2-point gait pattern.      TODAY'S TREATMENT: Evaluation/ reviewed HEP/ gait training.  Donning/ doffing prosthetic leg.     PATIENT EDUCATION:  Education details: Reviewed HEP Person educated: Patient Education method: Explanation, Demonstration, and Handouts Education comprehension: verbalized understanding and returned demonstration   HOME EXERCISE PROGRAM: See handouts.   ASSESSMENT:  CLINICAL IMPRESSION: Patient is a pleasant 76 y.o. male who was seen today for physical therapy evaluation and treatment for L BKA.  Pt. Presents with good B LE AROM, esp. L knee AROM 0-130 deg.  Moderate hip muscle weakness and pt. Very motivated to improve standing/ walking independence.  Pt. Able to don/ doff prosthetic leg with mod. I and stand with UE assist for safety.  Pt. Ambulates short distances with use of RW and consistent recip. Step pattern.  Pt. Will benefit from skilled PT services to increase LE muscle strength to improve independence with walking/ return to  playing golf.     OBJECTIVE IMPAIRMENTS Abnormal gait, decreased activity tolerance, decreased balance, decreased endurance, decreased mobility, difficulty walking, decreased strength, impaired flexibility, improper body mechanics, postural dysfunction, and pain.   ACTIVITY LIMITATIONS carrying, lifting, bending, standing, squatting, stairs, transfers, toileting, dressing, and locomotion level  PARTICIPATION LIMITATIONS: driving, community activity, and yard work  PERSONAL FACTORS Fitness and Past/current experiences are also affecting patient's functional outcome.   REHAB POTENTIAL: Good  CLINICAL DECISION MAKING: Stable/uncomplicated  EVALUATION COMPLEXITY: Low   GOALS: Goals reviewed with patient? Yes  SHORT TERM GOALS: Target date: 07/22/22 Pt. Independent with HEP to increase B hip strength 1/2 muscle grade to improve standing/walking mod. Independence.   Baseline:  see above Goal status: INITIAL    LONG TERM GOALS: Target date: 08/19/22  Pt. Will increase FOTO to 59 to improve functional mobility.   Baseline:  initial FOTO 40 Goal status: INITIAL  2.  Pt. Able to ascend/descend 13 stairs with R handrail and consistent step pattern to improve pts. Ability to go to 2nd floor of home.   Baseline:  Goal status: INITIAL  3.  Pt. Will ambulate with mod. I and SPC with consistent 2-point gait pattern and no loss of balance to improve independence at home/ community.   Baseline:  Goal status: INITIAL  PLAN: PT FREQUENCY: 2x/week  PT DURATION: 8 weeks  PLANNED INTERVENTIONS: Therapeutic exercises, Therapeutic activity, Neuromuscular re-education, Balance training, Gait training, Patient/Family education, Self Care, Joint mobilization, Stair training, Prosthetic training, Manual therapy, and Re-evaluation  PLAN FOR NEXT SESSION: Progress to [place], PT, DPT # [phone] 06/25/2022, 3:23 PM

## 2022-06-27 ENCOUNTER — Ambulatory Visit: Payer: Medicare HMO | Admitting: Physical Therapy

## 2022-06-27 DIAGNOSIS — M6281 Muscle weakness (generalized): Secondary | ICD-10-CM | POA: Diagnosis not present

## 2022-06-27 DIAGNOSIS — D62 Acute posthemorrhagic anemia: Secondary | ICD-10-CM | POA: Diagnosis not present

## 2022-06-27 DIAGNOSIS — I5033 Acute on chronic diastolic (congestive) heart failure: Secondary | ICD-10-CM | POA: Diagnosis not present

## 2022-06-27 DIAGNOSIS — I480 Paroxysmal atrial fibrillation: Secondary | ICD-10-CM | POA: Diagnosis not present

## 2022-06-27 DIAGNOSIS — R269 Unspecified abnormalities of gait and mobility: Secondary | ICD-10-CM

## 2022-06-27 DIAGNOSIS — I130 Hypertensive heart and chronic kidney disease with heart failure and stage 1 through stage 4 chronic kidney disease, or unspecified chronic kidney disease: Secondary | ICD-10-CM | POA: Diagnosis not present

## 2022-06-27 DIAGNOSIS — Z4781 Encounter for orthopedic aftercare following surgical amputation: Secondary | ICD-10-CM | POA: Diagnosis not present

## 2022-06-27 DIAGNOSIS — I4719 Other supraventricular tachycardia: Secondary | ICD-10-CM | POA: Diagnosis not present

## 2022-06-27 DIAGNOSIS — N182 Chronic kidney disease, stage 2 (mild): Secondary | ICD-10-CM | POA: Diagnosis not present

## 2022-06-27 DIAGNOSIS — Z89512 Acquired absence of left leg below knee: Secondary | ICD-10-CM

## 2022-06-30 DIAGNOSIS — Z86711 Personal history of pulmonary embolism: Secondary | ICD-10-CM | POA: Diagnosis not present

## 2022-06-30 DIAGNOSIS — Z955 Presence of coronary angioplasty implant and graft: Secondary | ICD-10-CM | POA: Diagnosis not present

## 2022-06-30 DIAGNOSIS — E119 Type 2 diabetes mellitus without complications: Secondary | ICD-10-CM | POA: Diagnosis not present

## 2022-06-30 DIAGNOSIS — E1169 Type 2 diabetes mellitus with other specified complication: Secondary | ICD-10-CM | POA: Diagnosis not present

## 2022-06-30 DIAGNOSIS — E782 Mixed hyperlipidemia: Secondary | ICD-10-CM | POA: Diagnosis not present

## 2022-06-30 DIAGNOSIS — R42 Dizziness and giddiness: Secondary | ICD-10-CM | POA: Diagnosis not present

## 2022-06-30 DIAGNOSIS — I10 Essential (primary) hypertension: Secondary | ICD-10-CM | POA: Diagnosis not present

## 2022-06-30 DIAGNOSIS — K219 Gastro-esophageal reflux disease without esophagitis: Secondary | ICD-10-CM | POA: Diagnosis not present

## 2022-06-30 DIAGNOSIS — I2510 Atherosclerotic heart disease of native coronary artery without angina pectoris: Secondary | ICD-10-CM | POA: Diagnosis not present

## 2022-07-01 ENCOUNTER — Ambulatory Visit: Payer: Medicare HMO | Admitting: Physical Therapy

## 2022-07-01 DIAGNOSIS — M6281 Muscle weakness (generalized): Secondary | ICD-10-CM

## 2022-07-01 DIAGNOSIS — R269 Unspecified abnormalities of gait and mobility: Secondary | ICD-10-CM | POA: Diagnosis not present

## 2022-07-01 DIAGNOSIS — Z89512 Acquired absence of left leg below knee: Secondary | ICD-10-CM | POA: Diagnosis not present

## 2022-07-02 NOTE — Therapy (Signed)
OUTPATIENT PHYSICAL THERAPY LOWER EXTREMITY TREATMENT   Patient Name: Phillip [name] MRN: [id number] DOB:[birth year], 76 y.o., male Today's Date: 06/27/2022   PT End of Session - 07/02/22 1645     Visit Number 2    Number of Visits 9    Date for PT Re-Evaluation 08/19/22    PT Start Time 1343    PT Stop Time 1433    PT Time Calculation (min) 50 min    Activity Tolerance Patient tolerated treatment well             Past Medical History:  Diagnosis Date   Arthritis    Basal cell carcinoma    L clavicle, L prox forearm- removed years ago    Bladder cancer ([place])    Bladder neck contracture    CAD (coronary artery disease)    a. 12/2020 NSTEMI/Cath: LM nl, LAD 95ost/p, LCX 50p, RCA nl. EF 45-50%.   Cataract    CKD (chronic kidney disease), stage III [facility])    ED (erectile dysfunction)    Frequency    GERD (gastroesophageal reflux disease)    History of pulmonary embolus (PE) 11/2018   a. Following LE DVT-->Chronic warfarin.   Hyperlipidemia LDL goal <70    Hypertension    Hypothyroidism    Incontinence of urine    sui, s/p cryoablation   Ischemic cardiomyopathy    a. 11/2018 Echo: EF 60-65%; b. 12/2020 LV Gram: EF 45-50% in setting of NSTEMI.   Kidney stones    Neuropathy    feet   Nocturia    Prostate cancer ([place])    S/P   CRYOABLATION   Right elbow tendinitis    Right Lower Extremity DVT (deep venous thrombosis) ([place]) 11/25/2018   Vertigo    1-2x/yr   Wears glasses    Past Surgical History:  Procedure Laterality Date   AMPUTATION Left 03/16/2022   Procedure: AMPUTATION BELOW KNEE;  Surgeon: Katha Cabal, MD;  Location: [facility];  Service: Vascular;  Laterality: Left;   ARTHROPLASTY AND TENDON REPAIR LEFT THUMB  JAN 2014   CATARACT EXTRACTION W/PHACO Left 11/16/2020   Procedure: CATARACT EXTRACTION PHACO AND INTRAOCULAR LENS PLACEMENT (IOC) LEFT  7.09 00:56.4 12.6%;  Surgeon: Leandrew Koyanagi, MD;  Location: [place];  Service:  Ophthalmology;  Laterality: Left;   CATARACT EXTRACTION W/PHACO Right 12/02/2020   Procedure: CATARACT EXTRACTION PHACO AND INTRAOCULAR LENS PLACEMENT (IOC) RIGHT MALYUGIN 5.52 00:49.7 11.1%;  Surgeon: Leandrew Koyanagi, MD;  Location: [place];  Service: Ophthalmology;  Laterality: Right;   COLONOSCOPY     COLONOSCOPY WITH PROPOFOL N/A 08/09/2017   Procedure: COLONOSCOPY WITH PROPOFOL;  Surgeon: Manya Silvas, MD;  Location: [facility] ENDOSCOPY;  Service: Endoscopy;  Laterality: N/A;   CORONARY ANGIOGRAPHY N/A 12/28/2020   Procedure: CORONARY ANGIOGRAPHY;  Surgeon: Sherren Mocha, MD;  Location: [place] CV LAB;  Service: Cardiovascular;  Laterality: N/A;   CORONARY STENT INTERVENTION N/A 12/28/2020   Procedure: CORONARY STENT INTERVENTION;  Surgeon: Sherren Mocha, MD;  Location: [place] [facility] LAB;  Service: Cardiovascular;  Laterality: N/A;   ESOPHAGOGASTRODUODENOSCOPY (EGD) WITH PROPOFOL N/A 08/09/2017   Procedure: ESOPHAGOGASTRODUODENOSCOPY (EGD) WITH PROPOFOL;  Surgeon: Manya Silvas, MD;  Location: [facility] ENDOSCOPY;  Service: Endoscopy;  Laterality: N/A;   GREEN LIGHT LASER TURP (TRANSURETHRAL RESECTION OF PROSTATE N/A 12/14/2015   Procedure: GREEN LIGHT LASER TURP (TRANSURETHRAL RESECTION OF PROSTATE) LASER OF BLADDER NECK CONTRACTURE;  Surgeon: Carolan Clines, MD;  Location: [place];  Service: Urology;  Laterality: N/A;   IVC FILTER INSERTION N/A 03/19/2022   Procedure: IVC FILTER INSERTION;  Surgeon: Serafina Mitchell, MD;  Location: [place] [facility];  Service: Cardiovascular;  Laterality: N/A;   LEFT HEART CATH AND CORONARY ANGIOGRAPHY N/A 12/24/2020   Procedure: LEFT HEART CATH AND CORONARY ANGIOGRAPHY and possible PCI and stent;  Surgeon: Yolonda Kida, MD;  Location: [place] [facility];  Service: Cardiovascular;  Laterality: N/A;   LEFT HEART CATH AND CORONARY ANGIOGRAPHY N/A 01/06/2021   Procedure: LEFT HEART CATH AND CORONARY  ANGIOGRAPHY;  Surgeon: Troy Sine, MD;  Location: [place] [facility];  Service: Cardiovascular;  Laterality: N/A;   LOWER EXTREMITY ANGIOGRAPHY Left 03/11/2022   Procedure: Lower Extremity Angiography;  Surgeon: Katha Cabal, MD;  Location: [place] [facility];  Service: Cardiovascular;  Laterality: Left;   PENILE PROSTHESIS IMPLANT N/A 12/06/2013   Procedure: PENILE PROTHESIS INFLATABLE;  Surgeon: Ailene Rud, MD;  Location: [facility];  Service: Urology;  Laterality: N/A;   PROSTATE CRYOABLATION  06-01-2012  [facility]   PULMONARY THROMBECTOMY N/A 03/18/2022   Procedure: PULMONARY THROMBECTOMY;  Surgeon: Algernon Huxley, MD;  Location: [place] [facility];  Service: Cardiovascular;  Laterality: N/A;   PULMONARY THROMBECTOMY Bilateral 03/23/2022   Procedure: PULMONARY THROMBECTOMY;  Surgeon: Katha Cabal, MD;  Location: [place] [facility];  Service: Cardiovascular;  Laterality: Bilateral;   THROMBECTOMY ILIAC ARTERY Left 03/12/2022   Procedure: THROMBECTOMY LEG;  Surgeon: Conrad [place], MD;  Location: [facility];  Service: Vascular;  Laterality: Left;   TRANSURETHRAL RESECTION OF BLADDER NECK N/A 03/20/2015   Procedure: RELEASE BLADDER NECK CONTRACTURE  WITH WOLF BUTTON ELECTRODE ;  Surgeon: Carolan Clines, MD;  Location: [place];  Service: Urology;  Laterality: N/A;   TRANSURETHRAL RESECTION OF BLADDER TUMOR N/A 12/05/2018   Procedure: TRANSURETHRAL RESECTION OF BLADDER TUMOR (TURBT)/CYSTOSCOPY/  INSTILLATION OF Rodell Perna;  Surgeon: Ceasar Mons, MD;  Location: [facility];  Service: Urology;  Laterality: N/A;   TRANSURETHRAL RESECTION OF BLADDER TUMOR N/A 01/15/2020   Procedure: TRANSURETHRAL RESECTION OF BLADDER TUMOR (TURBT)/ CYSTOSCOPY;  Surgeon: Ceasar Mons, MD;  Location: [facility];  Service: Urology;  Laterality: N/A;   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      Patient Active Problem List   Diagnosis Date Noted   Iron deficiency anemia 06/20/2022   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   History of bladder cancer 05/19/2022   Goals of care, counseling/discussion 03/23/2022   Reactive thrombocytosis 03/21/2022   Paroxysmal atrial fibrillation with RVR ([place]) 03/20/2022   Acute bilateral deep vein thrombosis (DVT) of femoral veins (HCC) 03/20/2022   Acute respiratory failure with hypoxia (HCC) 03/16/2022   Acute on chronic diastolic CHF (congestive heart failure) ([place]) 03/16/2022   PSVT (paroxysmal supraventricular tachycardia) 03/16/2022   AKI (acute kidney injury) ([place]) 03/15/2022   Critical limb ischemia of left lower extremity with ulceration of lower leg ([place]) 03/15/2022   Arterial occlusion 03/11/2022   Elevated troponin    Unstable angina ([place]) 01/05/2021   Hyperlipidemia LDL goal <70    CKD (chronic kidney disease), stage III (HCC)    Coronary artery disease    NSTEMI (non-ST elevated myocardial infarction) ([place]) 12/23/2020   Acquired hypothyroidism 04/20/2020   Chemotherapy-induced neuropathy ([place]) 04/20/2020   Nonthrombocytopenic purpura ([place]) 12/03/2019   Hypercoagulable state ([place]) 12/10/2018   Recurrent deep vein thrombosis (DVT) (HCC)    Bilateral pulmonary  embolism ([place]) 12/07/2018   GERD (gastroesophageal reflux disease) 12/07/2018   HTN (hypertension) 12/07/2018   HLD (hyperlipidemia) 12/07/2018   Prostate cancer ([place]) 12/07/2018   Bladder cancer ([place]) 12/07/2018   DM type 2 with diabetic mixed hyperlipidemia ([place]) 04/04/2018   Pain in right hand 10/04/2017   Tubular adenoma 85/27/7824   Helicobacter pylori (H. pylori) infection 09/04/2017   Medicare annual wellness visit, initial 03/31/2017   History of prostate cancer 10/27/2014   Lower urinary tract symptoms (LUTS) 03/18/2012   Nephrolithiasis 03/18/2012    PCP: Rusty Aus, MD  REFERRING PROVIDER: Kris Hartmann, NP  REFERRING DIAG: Left below knee  amputee  THERAPY DIAG:  Left below-knee amputee [facility])  Gait difficulty  Muscle weakness (generalized)  Rationale for Evaluation and Treatment Rehabilitation  ONSET DATE: 03/16/22  SUBJECTIVE:   SUBJECTIVE STATEMENT:   EVALUATION Pt. S/p R BKA after blood clots in lower leg.  Pt. Reports minimum phantom limb symptoms and occasional muscle spasms.  Pt. Takes Baclofen to manage symptoms.  Pt. Received prosthetic leg on Wednesday from [facility].  Pt. Has h/o low back pain and has received spinal epidurals in past (2 years ago).    PERTINENT HISTORY: 1. Hx of BKA, left [facility]) Mr. Joakim Huesman was seen for further evaluation for left below knee prosthesis.  The is a 76 year old male who had amputation on 03/16/2022.  At the time he is well healed and ready for fitting of new below knee prosthesis.  He is a highly motivated individual and should do well once fitted with prosthesis.  She has no problem returning to a K2 ambulator, which will allow him to walk inside and outside of his home and overload level barriers.    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  He will be having a bone marrow biopsy.  We will continue to have the patient keep his IVC filter for now as there may be further test.  We will have the patient return in 3 months to discuss filter removal.    PAIN:  Are you having pain? No  PRECAUTIONS: None  WEIGHT BEARING RESTRICTIONS No  FALLS:  Has patient fallen in last 6 months? No  LIVING ENVIRONMENT: Lives with: lives with their spouse Lives in: House/apartment Stairs: Yes: Internal: 13+ steps; on right going up Has following equipment at home: Single point cane and Walker - 2 wheeled  OCCUPATION: Retired  PLOF: [place] with walking and return to playing golf by spring.     OBJECTIVE:   PATIENT SURVEYS:  FOTO initial 40/ goal 64.    COGNITION:  Overall cognitive status:  Within functional limits for tasks assessed     SENSATION: WFL  EDEMA:  Circumferential: L/R knee joint line (34/36 cm.), calf (30.5/ 33 cm), distal quad (35/36 cm).    POSTURE: rounded shoulders and forward head  PALPATION: No tenderness/ good incision healing on residual limb  LOWER EXTREMITY ROM:  B LE WFL (all planes).  L knee 0-130 deg.   LOWER EXTREMITY MMT:  R LE muscle strength grossly 5/5 MMT except hip flexion 4+/5 MMT.  L LE strength grossly 5/5 MMT except hip flexion 4/5 MMT and hip abduction 4+/5 MMT.    FUNCTIONAL TESTS:  5 times sit to stand: 14.1 sec. And requires R UE assist.  Able to stand from chair with addition of 2" Airex pad.    GAIT: Distance walked: in clinic/ //-bars  Assistive device utilized: Environmental consultant - 2 wheeled Level of assistance: Min A Comments: Ambulates in clinic with RW and min. A/ cuing for proper technique and step pattern.  Pt. Able to ambulate in //-bars with R UE assist only and 2-point gait pattern.      TODAY'S TREATMENT:  06/27/22:  Subjective: Pt. Reports no new issues and confirmed prosthetic wear times (averaging 2 hours in morning and  2 hours in afternoon).  No c/o pain and pt. Entered PT with prosthetic leg donned properly and use of RW.    There.ex.:  STS from gray chair with Airex pad 10x.  Working on proper wt. Shifting/ standing posture.    Reviewed HEP  Nustep L4 10 min. B UE/LE (consistent cadence)  Gait training:  Amb. In //-bars with B UE progressing to R UE only and focused on BOS/ recip. Gait pattern.  Forward/ lateral/ backwards walking.  4 laps.  SPC in //-bars with cuing to increase R LE step length/ heel strike (mirror feedback).  Walking around PT gym with use of SPC and min./mod. A for safety and verbal cuing.      PATIENT EDUCATION:  Education details: Reviewed HEP Person educated: Patient Education method: Explanation, Demonstration, and Handouts Education comprehension: verbalized understanding  and returned demonstration   HOME EXERCISE PROGRAM: See handouts.   ASSESSMENT:  CLINICAL IMPRESSION: Patient is highly motivated and hard working during standing/ walking tasks today.  Pt. Able to demonstrate a recip. Gait pattern with use of SPC but cuing to increase R LE step length/ heel strike.  Pt. Requires cuing to correct upright posture/ head position during standing and walking tasks.  Benefits from use of mirror for feedback.  Pt. Will benefit from skilled PT services to increase LE muscle strength to improve independence with walking/ return to playing golf.     OBJECTIVE IMPAIRMENTS Abnormal gait, decreased activity tolerance, decreased balance, decreased endurance, decreased mobility, difficulty walking, decreased strength, impaired flexibility, improper body mechanics, postural dysfunction, and pain.   ACTIVITY LIMITATIONS carrying, lifting, bending, standing, squatting, stairs, transfers, toileting, dressing, and locomotion level  PARTICIPATION LIMITATIONS: driving, community activity, and yard work  PERSONAL FACTORS Fitness and Past/current experiences are also affecting patient's functional outcome.   REHAB POTENTIAL: Good  CLINICAL DECISION MAKING: Stable/uncomplicated  EVALUATION COMPLEXITY: Low   GOALS: Goals reviewed with patient? Yes  SHORT TERM GOALS: Target date: 07/22/22 Pt. Independent with HEP to increase B hip strength 1/2 muscle grade to improve standing/walking mod. Independence.   Baseline:  see above Goal status: INITIAL    LONG TERM GOALS: Target date: 08/19/22  Pt. Will increase FOTO to 59 to improve functional mobility.   Baseline:  initial FOTO 40 Goal status: INITIAL  2.  Pt. Able to ascend/descend 13 stairs with R handrail and consistent step pattern to improve pts. Ability to go to 2nd floor of home.   Baseline:  Goal status: INITIAL  3.  Pt. Will ambulate with mod. I and SPC with consistent 2-point gait pattern and no loss of  balance to improve independence at home/ community.   Baseline:  Goal status: INITIAL  PLAN: PT FREQUENCY: 2x/week  PT DURATION: 8 weeks  PLANNED INTERVENTIONS: Therapeutic exercises, Therapeutic activity, Neuromuscular re-education, Balance training, Gait training, Patient/Family education, Self Care, Joint mobilization, Stair training, Prosthetic training, Manual therapy, and Re-evaluation  PLAN FOR NEXT SESSION: Progress to [place], PT, DPT # [phone] 07/02/2022, 4:46 PM

## 2022-07-02 NOTE — Addendum Note (Signed)
Addended by: Pura Spice on: 07/02/2022 04:44 PM   Modules accepted: Orders

## 2022-07-04 ENCOUNTER — Inpatient Hospital Stay: Payer: Medicare HMO

## 2022-07-04 DIAGNOSIS — Z8551 Personal history of malignant neoplasm of bladder: Secondary | ICD-10-CM | POA: Diagnosis not present

## 2022-07-04 DIAGNOSIS — Z86711 Personal history of pulmonary embolism: Secondary | ICD-10-CM | POA: Diagnosis not present

## 2022-07-04 DIAGNOSIS — D509 Iron deficiency anemia, unspecified: Secondary | ICD-10-CM | POA: Diagnosis not present

## 2022-07-04 DIAGNOSIS — Z7901 Long term (current) use of anticoagulants: Secondary | ICD-10-CM | POA: Diagnosis not present

## 2022-07-04 DIAGNOSIS — Z1589 Genetic susceptibility to other disease: Secondary | ICD-10-CM

## 2022-07-04 DIAGNOSIS — D471 Chronic myeloproliferative disease: Secondary | ICD-10-CM | POA: Diagnosis not present

## 2022-07-04 DIAGNOSIS — Z86718 Personal history of other venous thrombosis and embolism: Secondary | ICD-10-CM | POA: Diagnosis not present

## 2022-07-04 LAB — COMPREHENSIVE METABOLIC PANEL
ALT: 18 U/L (ref 0–44)
AST: 20 U/L (ref 15–41)
Albumin: 3.8 g/dL (ref 3.5–5.0)
Alkaline Phosphatase: 50 U/L (ref 38–126)
Anion gap: 5 (ref 5–15)
BUN: 22 mg/dL (ref 8–23)
CO2: 26 mmol/L (ref 22–32)
Calcium: 8.9 mg/dL (ref 8.9–10.3)
Chloride: 111 mmol/L (ref 98–111)
Creatinine, Ser: 1.31 mg/dL — ABNORMAL HIGH (ref 0.61–1.24)
GFR, Estimated: 57 mL/min — ABNORMAL LOW (ref >60)
Glucose, Bld: 150 mg/dL — ABNORMAL HIGH (ref 70–99)
Potassium: 4.4 mmol/L (ref 3.5–5.1)
Sodium: 142 mmol/L (ref 135–145)
Total Bilirubin: 0.6 mg/dL (ref 0.3–1.2)
Total Protein: 7.2 g/dL (ref 6.5–8.1)

## 2022-07-04 LAB — CBC WITH DIFFERENTIAL/PLATELET
Abs Immature Granulocytes: 0.04 10*3/uL (ref 0.00–0.07)
Basophils Absolute: 0.1 10*3/uL (ref 0.0–0.1)
Basophils Relative: 1 %
Eosinophils Absolute: 0.3 10*3/uL (ref 0.0–0.5)
Eosinophils Relative: 3 %
HCT: 43.5 % (ref 39.0–52.0)
Hemoglobin: 13 g/dL (ref 13.0–17.0)
Immature Granulocytes: 0 %
Lymphocytes Relative: 26 %
Lymphs Abs: 2.3 10*3/uL (ref 0.7–4.0)
MCH: 26.5 pg (ref 26.0–34.0)
MCHC: 29.9 g/dL — ABNORMAL LOW (ref 30.0–36.0)
MCV: 88.8 fL (ref 80.0–100.0)
Monocytes Absolute: 0.6 10*3/uL (ref 0.1–1.0)
Monocytes Relative: 7 %
Neutro Abs: 5.7 10*3/uL (ref 1.7–7.7)
Neutrophils Relative %: 63 %
Platelets: 577 10*3/uL — ABNORMAL HIGH (ref 150–400)
RBC: 4.9 MIL/uL (ref 4.22–5.81)
RDW: 22.4 % — ABNORMAL HIGH (ref 11.5–15.5)
WBC: 9.1 10*3/uL (ref 4.0–10.5)
nRBC: 0 % (ref 0.0–0.2)

## 2022-07-05 ENCOUNTER — Ambulatory Visit: Payer: Medicare HMO | Admitting: Physical Therapy

## 2022-07-05 DIAGNOSIS — M6281 Muscle weakness (generalized): Secondary | ICD-10-CM

## 2022-07-05 DIAGNOSIS — R948 Abnormal results of function studies of other organs and systems: Secondary | ICD-10-CM | POA: Diagnosis not present

## 2022-07-05 DIAGNOSIS — R269 Unspecified abnormalities of gait and mobility: Secondary | ICD-10-CM | POA: Diagnosis not present

## 2022-07-05 DIAGNOSIS — Z89512 Acquired absence of left leg below knee: Secondary | ICD-10-CM

## 2022-07-05 DIAGNOSIS — E291 Testicular hypofunction: Secondary | ICD-10-CM | POA: Diagnosis not present

## 2022-07-06 ENCOUNTER — Telehealth: Payer: Self-pay | Admitting: *Deleted

## 2022-07-07 ENCOUNTER — Ambulatory Visit: Payer: Medicare HMO | Admitting: Physical Therapy

## 2022-07-07 DIAGNOSIS — Z89512 Acquired absence of left leg below knee: Secondary | ICD-10-CM | POA: Diagnosis not present

## 2022-07-07 DIAGNOSIS — M6281 Muscle weakness (generalized): Secondary | ICD-10-CM | POA: Diagnosis not present

## 2022-07-07 DIAGNOSIS — R269 Unspecified abnormalities of gait and mobility: Secondary | ICD-10-CM | POA: Diagnosis not present

## 2022-07-07 DIAGNOSIS — Z961 Presence of intraocular lens: Secondary | ICD-10-CM | POA: Diagnosis not present

## 2022-07-08 DIAGNOSIS — Z8546 Personal history of malignant neoplasm of prostate: Secondary | ICD-10-CM | POA: Diagnosis not present

## 2022-07-08 DIAGNOSIS — Z8551 Personal history of malignant neoplasm of bladder: Secondary | ICD-10-CM | POA: Diagnosis not present

## 2022-07-10 NOTE — Therapy (Signed)
OUTPATIENT PHYSICAL THERAPY LOWER EXTREMITY TREATMENT   Patient Name: Phillip [name] MRN: [id number] DOB:1946-07-05, 76 y.o., male Today's Date: 07/01/2022   PT End of Session - 07/10/22 1758     Visit Number 3    Number of Visits 9    Date for PT Re-Evaluation 08/19/22    PT Start Time 0808    PT Stop Time 0901    PT Time Calculation (min) 53 min    Activity Tolerance Patient tolerated treatment well             Past Medical History:  Diagnosis Date   Arthritis    Basal cell carcinoma    L clavicle, L prox forearm- removed years ago    Bladder cancer ([place])    Bladder neck contracture    CAD (coronary artery disease)    a. 12/2020 NSTEMI/Cath: LM nl, LAD 95ost/p, LCX 50p, RCA nl. EF 45-50%.   Cataract    CKD (chronic kidney disease), stage III [facility])    ED (erectile dysfunction)    Frequency    GERD (gastroesophageal reflux disease)    History of pulmonary embolus (PE) 11/2018   a. Following LE DVT-->Chronic warfarin.   Hyperlipidemia LDL goal <70    Hypertension    Hypothyroidism    Incontinence of urine    sui, s/p cryoablation   Ischemic cardiomyopathy    a. 11/2018 Echo: EF 60-65%; b. 12/2020 LV Gram: EF 45-50% in setting of NSTEMI.   Kidney stones    Neuropathy    feet   Nocturia    Prostate cancer ([place])    S/P   CRYOABLATION   Right elbow tendinitis    Right Lower Extremity DVT (deep venous thrombosis) ([place]) 11/25/2018   Vertigo    1-2x/yr   Wears glasses    Past Surgical History:  Procedure Laterality Date   AMPUTATION Left 03/16/2022   Procedure: AMPUTATION BELOW KNEE;  Surgeon: Katha Cabal, MD;  Location: [facility];  Service: Vascular;  Laterality: Left;   ARTHROPLASTY AND TENDON REPAIR LEFT THUMB  JAN 2014   CATARACT EXTRACTION W/PHACO Left 11/16/2020   Procedure: CATARACT EXTRACTION PHACO AND INTRAOCULAR LENS PLACEMENT (IOC) LEFT  7.09 00:56.4 12.6%;  Surgeon: Leandrew Koyanagi, MD;  Location: [place];  Service:  Ophthalmology;  Laterality: Left;   CATARACT EXTRACTION W/PHACO Right 12/02/2020   Procedure: CATARACT EXTRACTION PHACO AND INTRAOCULAR LENS PLACEMENT (IOC) RIGHT MALYUGIN 5.52 00:49.7 11.1%;  Surgeon: Leandrew Koyanagi, MD;  Location: [place];  Service: Ophthalmology;  Laterality: Right;   COLONOSCOPY     COLONOSCOPY WITH PROPOFOL N/A 08/09/2017   Procedure: COLONOSCOPY WITH PROPOFOL;  Surgeon: Manya Silvas, MD;  Location: [facility] ENDOSCOPY;  Service: Endoscopy;  Laterality: N/A;   CORONARY ANGIOGRAPHY N/A 12/28/2020   Procedure: CORONARY ANGIOGRAPHY;  Surgeon: Sherren Mocha, MD;  Location: [place] CV LAB;  Service: Cardiovascular;  Laterality: N/A;   CORONARY STENT INTERVENTION N/A 12/28/2020   Procedure: CORONARY STENT INTERVENTION;  Surgeon: Sherren Mocha, MD;  Location: [place] [facility];  Service: Cardiovascular;  Laterality: N/A;   ESOPHAGOGASTRODUODENOSCOPY (EGD) WITH PROPOFOL N/A 08/09/2017   Procedure: ESOPHAGOGASTRODUODENOSCOPY (EGD) WITH PROPOFOL;  Surgeon: Manya Silvas, MD;  Location: [facility]COPY;  Service: Endoscopy;  Laterality: N/A;   GREEN LIGHT LASER TURP (TRANSURETHRAL RESECTION OF PROSTATE N/A 12/14/2015   Procedure: GREEN LIGHT LASER TURP (TRANSURETHRAL RESECTION OF PROSTATE) LASER OF BLADDER NECK CONTRACTURE;  Surgeon: Carolan Clines, MD;  Location: [place];  Service: Urology;  Laterality: N/A;   IVC FILTER INSERTION N/A 03/19/2022   Procedure: IVC FILTER INSERTION;  Surgeon: Serafina Mitchell, MD;  Location: [place] [facility];  Service: Cardiovascular;  Laterality: N/A;   LEFT HEART CATH AND CORONARY ANGIOGRAPHY N/A 12/24/2020   Procedure: LEFT HEART CATH AND CORONARY ANGIOGRAPHY and possible PCI and stent;  Surgeon: Yolonda Kida, MD;  Location: [place] [facility];  Service: Cardiovascular;  Laterality: N/A;   LEFT HEART CATH AND CORONARY ANGIOGRAPHY N/A 01/06/2021   Procedure: LEFT HEART CATH AND CORONARY  ANGIOGRAPHY;  Surgeon: Troy Sine, MD;  Location: [place] [facility];  Service: Cardiovascular;  Laterality: N/A;   LOWER EXTREMITY ANGIOGRAPHY Left 03/11/2022   Procedure: Lower Extremity Angiography;  Surgeon: Katha Cabal, MD;  Location: [place] [facility];  Service: Cardiovascular;  Laterality: Left;   PENILE PROSTHESIS IMPLANT N/A 12/06/2013   Procedure: PENILE PROTHESIS INFLATABLE;  Surgeon: Ailene Rud, MD;  Location: [facility];  Service: Urology;  Laterality: N/A;   PROSTATE CRYOABLATION  06-01-2012  [facility]   PULMONARY THROMBECTOMY N/A 03/18/2022   Procedure: PULMONARY THROMBECTOMY;  Surgeon: Algernon Huxley, MD;  Location: [place] [facility];  Service: Cardiovascular;  Laterality: N/A;   PULMONARY THROMBECTOMY Bilateral 03/23/2022   Procedure: PULMONARY THROMBECTOMY;  Surgeon: Katha Cabal, MD;  Location: [place] [facility];  Service: Cardiovascular;  Laterality: Bilateral;   THROMBECTOMY ILIAC ARTERY Left 03/12/2022   Procedure: THROMBECTOMY LEG;  Surgeon: Conrad [place], MD;  Location: [facility];  Service: Vascular;  Laterality: Left;   TRANSURETHRAL RESECTION OF BLADDER NECK N/A 03/20/2015   Procedure: RELEASE BLADDER NECK CONTRACTURE  WITH WOLF BUTTON ELECTRODE ;  Surgeon: Carolan Clines, MD;  Location: [place];  Service: Urology;  Laterality: N/A;   TRANSURETHRAL RESECTION OF BLADDER TUMOR N/A 12/05/2018   Procedure: TRANSURETHRAL RESECTION OF BLADDER TUMOR (TURBT)/CYSTOSCOPY/  INSTILLATION OF Rodell Perna;  Surgeon: Ceasar Mons, MD;  Location[facility];  Service: Urology;  Laterality: N/A;   TRANSURETHRAL RESECTION OF BLADDER TUMOR N/A 01/15/2020   Procedure: TRANSURETHRAL RESECTION OF BLADDER TUMOR (TURBT)/ CYSTOSCOPY;  Surgeon: Ceasar Mons, MD;  Location: [facility];  Service: Urology;  Laterality: N/A;   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      Patient Active Problem List   Diagnosis Date Noted   Iron deficiency anemia 06/20/2022   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   History of bladder cancer 05/19/2022   Goals of care, counseling/discussion 03/23/2022   Reactive thrombocytosis 03/21/2022   Paroxysmal atrial fibrillation with RVR ([place]) 03/20/2022   Acute bilateral deep vein thrombosis (DVT) of femoral veins (HCC) 03/20/2022   Acute respiratory failure with hypoxia (HCC) 03/16/2022   Acute on chronic diastolic CHF (congestive heart failure) ([place]) 03/16/2022   PSVT (paroxysmal supraventricular tachycardia) 03/16/2022   AKI (acute kidney injury) ([place]) 03/15/2022   Critical limb ischemia of left lower extremity with ulceration of lower leg ([place]) 03/15/2022   Arterial occlusion 03/11/2022   Elevated troponin    Unstable angina ([place]) 01/05/2021   Hyperlipidemia LDL goal <70    CKD (chronic kidney disease), stage III (HCC)    Coronary artery disease    NSTEMI (non-ST elevated myocardial infarction) ([place]) 12/23/2020   Acquired hypothyroidism 04/20/2020   Chemotherapy-induced neuropathy ([place]) 04/20/2020   Nonthrombocytopenic purpura ([place]) 12/03/2019   Hypercoagulable state ([place]) 12/10/2018   Recurrent deep vein thrombosis (DVT) (HCC)    Bilateral pulmonary  embolism ([place]) 12/07/2018   GERD (gastroesophageal reflux disease) 12/07/2018   HTN (hypertension) 12/07/2018   HLD (hyperlipidemia) 12/07/2018   Prostate cancer ([place]) 12/07/2018   Bladder cancer ([place]) 12/07/2018   DM type 2 with diabetic mixed hyperlipidemia ([place]) 04/04/2018   Pain in right hand 10/04/2017   Tubular adenoma 85/27/7824   Helicobacter pylori (H. pylori) infection 09/04/2017   Medicare annual wellness visit, initial 03/31/2017   History of prostate cancer 10/27/2014   Lower urinary tract symptoms (LUTS) 03/18/2012   Nephrolithiasis 03/18/2012    PCP: Rusty Aus, MD  REFERRING PROVIDER: Kris Hartmann, NP  REFERRING DIAG: Left below knee  amputee  THERAPY DIAG:  Left below-knee amputee [facility])  Gait difficulty  Muscle weakness (generalized)  Rationale for Evaluation and Treatment Rehabilitation  ONSET DATE: 03/16/22  SUBJECTIVE:   SUBJECTIVE STATEMENT:   EVALUATION Pt. S/p R BKA after blood clots in lower leg.  Pt. Reports minimum phantom limb symptoms and occasional muscle spasms.  Pt. Takes Baclofen to manage symptoms.  Pt. Received prosthetic leg on Wednesday from [facility].  Pt. Has h/o low back pain and has received spinal epidurals in past (2 years ago).    PERTINENT HISTORY: 1. Hx of BKA, left [facility]) Mr. Joakim Huesman was seen for further evaluation for left below knee prosthesis.  The is a 76 year old male who had amputation on 03/16/2022.  At the time he is well healed and ready for fitting of new below knee prosthesis.  He is a highly motivated individual and should do well once fitted with prosthesis.  She has no problem returning to a K2 ambulator, which will allow him to walk inside and outside of his home and overload level barriers.    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  He will be having a bone marrow biopsy.  We will continue to have the patient keep his IVC filter for now as there may be further test.  We will have the patient return in 3 months to discuss filter removal.    PAIN:  Are you having pain? No  PRECAUTIONS: None  WEIGHT BEARING RESTRICTIONS No  FALLS:  Has patient fallen in last 6 months? No  LIVING ENVIRONMENT: Lives with: lives with their spouse Lives in: House/apartment Stairs: Yes: Internal: 13+ steps; on right going up Has following equipment at home: Single point cane and Walker - 2 wheeled  OCCUPATION: Retired  PLOF: [place] with walking and return to playing golf by spring.     OBJECTIVE:   PATIENT SURVEYS:  FOTO initial 40/ goal 64.    COGNITION:  Overall cognitive status:  Within functional limits for tasks assessed     SENSATION: WFL  EDEMA:  Circumferential: L/R knee joint line (34/36 cm.), calf (30.5/ 33 cm), distal quad (35/36 cm).    POSTURE: rounded shoulders and forward head  PALPATION: No tenderness/ good incision healing on residual limb  LOWER EXTREMITY ROM:  B LE WFL (all planes).  L knee 0-130 deg.   LOWER EXTREMITY MMT:  R LE muscle strength grossly 5/5 MMT except hip flexion 4+/5 MMT.  L LE strength grossly 5/5 MMT except hip flexion 4/5 MMT and hip abduction 4+/5 MMT.    FUNCTIONAL TESTS:  5 times sit to stand: 14.1 sec. And requires R UE assist.  Able to stand from chair with addition of 2" Airex pad.    GAIT: Distance walked: in clinic/ //-bars  Assistive device utilized: Environmental consultant - 2 wheeled Level of assistance: Min A Comments: Ambulates in clinic with RW and min. A/ cuing for proper technique and step pattern.  Pt. Able to ambulate in //-bars with R UE assist only and 2-point gait pattern.      TODAY'S TREATMENT:  07/01/22:  Subjective: Pt states he wore L prosthetic leg too long yesterday.  Pt. Presents with good residual leg healing/ no swelling with minimal soreness.    There.ex.:  STS from mat table (varying heights) 10x.  Working on proper wt. Shifting/ standing posture.    Standing marching/ hip abduction/ wt.shifting in //-bars with mirror feedback.   Nustep L4 10 min. B UE/LE (consistent cadence)   Gait training:  Amb. In //-bars with B UE progressing to R UE only and focused on BOS/ recip. Gait pattern.  Forward/ lateral/ backwards walking.  4 laps.  SPC in //-bars with cuing to increase R LE step length/ heel strike (mirror feedback).  Walking around PT gym with use of SPC and min./mod. A for safety and verbal cuing.   Step ups/ downs at 6" step with handrail assist.       PATIENT EDUCATION:  Education details: Reviewed HEP Person educated: Patient Education method: Explanation, Demonstration, and  Handouts Education comprehension: verbalized understanding and returned demonstration   HOME EXERCISE PROGRAM: See handouts.   ASSESSMENT:  CLINICAL IMPRESSION: Patient is highly motivated and hard working during standing/ walking tasks today.  Pt. Able to demonstrate a recip. Gait pattern with use of SPC but cuing to increase R LE step length/ heel strike.  Pt. Requires cuing to correct upright posture/ head position during standing and walking tasks.  Benefits from use of mirror for feedback.  No change in HEP and pt. Instructed to avoid wearing prosthetic leg too long.  Pt. Will benefit from skilled PT services to increase LE muscle strength to improve independence with walking/ return to playing golf.     OBJECTIVE IMPAIRMENTS Abnormal gait, decreased activity tolerance, decreased balance, decreased endurance, decreased mobility, difficulty walking, decreased strength, impaired flexibility, improper body mechanics, postural dysfunction, and pain.   ACTIVITY LIMITATIONS carrying, lifting, bending, standing, squatting, stairs, transfers, toileting, dressing, and locomotion level  PARTICIPATION LIMITATIONS: driving, community activity, and yard work  PERSONAL FACTORS Fitness and Past/current experiences are also affecting patient's functional outcome.   REHAB POTENTIAL: Good  CLINICAL DECISION MAKING: Stable/uncomplicated  EVALUATION COMPLEXITY: Low   GOALS: Goals reviewed with patient? Yes  SHORT TERM GOALS: Target date: 07/22/22 Pt. Independent with HEP to increase B hip strength 1/2 muscle grade to improve standing/walking mod. Independence.   Baseline:  see above Goal status: INITIAL    LONG TERM GOALS: Target date: 08/19/22  Pt. Will increase FOTO to 59 to improve functional mobility.   Baseline:  initial FOTO 40 Goal status: INITIAL  2.  Pt. Able to ascend/descend 13 stairs with R handrail and consistent step pattern to improve pts. Ability to go to 2nd floor of home.    Baseline:  Goal status: INITIAL  3.  Pt. Will ambulate with mod. I and SPC with consistent 2-point gait pattern and no loss of balance to improve independence at home/ community.   Baseline:  Goal status: INITIAL  PLAN: PT FREQUENCY: 2x/week  PT DURATION: 8 weeks  PLANNED INTERVENTIONS: Therapeutic exercises, Therapeutic activity, Neuromuscular re-education, Balance training, Gait training, Patient/Family education, Self Care, Joint mobilization, Stair training, Prosthetic training, Manual therapy, and Re-evaluation  PLAN FOR NEXT SESSION: Progress to  SPC/ stair climbing.   Pura Spice, PT, DPT # [id number] 07/10/2022, 5:59 PM

## 2022-07-10 NOTE — Therapy (Signed)
OUTPATIENT PHYSICAL THERAPY LOWER EXTREMITY TREATMENT   Patient Name: Phillip [name] MRN: [id number] DOB:Feb 04, 1946, 76 y.o., male Today's Date: 07/05/2022   PT End of Session - 07/10/22 1916     Visit Number 4    Number of Visits 9    Date for PT Re-Evaluation 08/19/22    PT Start Time 6761    PT Stop Time 1816    PT Time Calculation (min) 49 min    Activity Tolerance Patient tolerated treatment well             Past Medical History:  Diagnosis Date   Arthritis    Basal cell carcinoma    L clavicle, L prox forearm- removed years ago    Bladder cancer ([place])    Bladder neck contracture    CAD (coronary artery disease)    a. 12/2020 NSTEMI/Cath: LM nl, LAD 95ost/p, LCX 50p, RCA nl. EF 45-50%.   Cataract    CKD (chronic kidney disease), stage III [facility])    ED (erectile dysfunction)    Frequency    GERD (gastroesophageal reflux disease)    History of pulmonary embolus (PE) 11/2018   a. Following LE DVT-->Chronic warfarin.   Hyperlipidemia LDL goal <70    Hypertension    Hypothyroidism    Incontinence of urine    sui, s/p cryoablation   Ischemic cardiomyopathy    a. 11/2018 Echo: EF 60-65%; b. 12/2020 LV Gram: EF 45-50% in setting of NSTEMI.   Kidney stones    Neuropathy    feet   Nocturia    Prostate cancer ([place])    S/P   CRYOABLATION   Right elbow tendinitis    Right Lower Extremity DVT (deep venous thrombosis) ([place]) 11/25/2018   Vertigo    1-2x/yr   Wears glasses    Past Surgical History:  Procedure Laterality Date   AMPUTATION Left 03/16/2022   Procedure: AMPUTATION BELOW KNEE;  Surgeon: Katha Cabal, MD;  Location: [facility];  Service: Vascular;  Laterality: Left;   ARTHROPLASTY AND TENDON REPAIR LEFT THUMB  JAN 2014   CATARACT EXTRACTION W/PHACO Left 11/16/2020   Procedure: CATARACT EXTRACTION PHACO AND INTRAOCULAR LENS PLACEMENT (IOC) LEFT  7.09 00:56.4 12.6%;  Surgeon: Leandrew Koyanagi, MD;  Location: [place];  Service:  Ophthalmology;  Laterality: Left;   CATARACT EXTRACTION W/PHACO Right 12/02/2020   Procedure: CATARACT EXTRACTION PHACO AND INTRAOCULAR LENS PLACEMENT (IOC) RIGHT MALYUGIN 5.52 00:49.7 11.1%;  Surgeon: Leandrew Koyanagi, MD;  Location: [place];  Service: Ophthalmology;  Laterality: Right;   COLONOSCOPY     COLONOSCOPY WITH PROPOFOL N/A 08/09/2017   Procedure: COLONOSCOPY WITH PROPOFOL;  Surgeon: Manya Silvas, MD;  Location: [facility] ENDOSCOPY;  Service: Endoscopy;  Laterality: N/A;   CORONARY ANGIOGRAPHY N/A 12/28/2020   Procedure: CORONARY ANGIOGRAPHY;  Surgeon: Sherren Mocha, MD;  Location: [place] CV LAB;  Service: Cardiovascular;  Laterality: N/A;   CORONARY STENT INTERVENTION N/A 12/28/2020   Procedure: CORONARY STENT INTERVENTION;  Surgeon: Sherren Mocha, MD;  Location: [place] CV LAB;  Service: Cardiovascular;  Laterality: N/A;   ESOPHAGOGASTRODUODENOSCOPY (EGD) WITH PROPOFOL N/A 08/09/2017   Procedure: ESOPHAGOGASTRODUODENOSCOPY (EGD) WITH PROPOFOL;  Surgeon: Manya Silvas, MD;  Location: [facility] ENDOSCOPY;  Service: Endoscopy;  Laterality: N/A;   GREEN LIGHT LASER TURP (TRANSURETHRAL RESECTION OF PROSTATE N/A 12/14/2015   Procedure: GREEN LIGHT LASER TURP (TRANSURETHRAL RESECTION OF PROSTATE) LASER OF BLADDER NECK CONTRACTURE;  Surgeon: Carolan Clines, MD;  Location: [place];  Service: Urology;  Laterality: N/A;   IVC FILTER INSERTION N/A 03/19/2022   Procedure: IVC FILTER INSERTION;  Surgeon: Serafina Mitchell, MD;  Location: [place] CV LAB;  Service: Cardiovascular;  Laterality: N/A;   LEFT HEART CATH AND CORONARY ANGIOGRAPHY N/A 12/24/2020   Procedure: LEFT HEART CATH AND CORONARY ANGIOGRAPHY and possible PCI and stent;  Surgeon: Yolonda Kida, MD;  Location: [place] CV LAB;  Service: Cardiovascular;  Laterality: N/A;   LEFT HEART CATH AND CORONARY ANGIOGRAPHY N/A 01/06/2021   Procedure: LEFT HEART CATH AND CORONARY  ANGIOGRAPHY;  Surgeon: Troy Sine, MD;  Location: [place] CV LAB;  Service: Cardiovascular;  Laterality: N/A;   LOWER EXTREMITY ANGIOGRAPHY Left 03/11/2022   Procedure: Lower Extremity Angiography;  Surgeon: Katha Cabal, MD;  Location: [place] CV LAB;  Service: Cardiovascular;  Laterality: Left;   PENILE PROSTHESIS IMPLANT N/A 12/06/2013   Procedure: PENILE PROTHESIS INFLATABLE;  Surgeon: Ailene Rud, MD;  Location: [facility];  Service: Urology;  Laterality: N/A;   PROSTATE CRYOABLATION  06-01-2012  [facility]   PULMONARY THROMBECTOMY N/A 03/18/2022   Procedure: PULMONARY THROMBECTOMY;  Surgeon: Algernon Huxley, MD;  Location: [place] CV LAB;  Service: Cardiovascular;  Laterality: N/A;   PULMONARY THROMBECTOMY Bilateral 03/23/2022   Procedure: PULMONARY THROMBECTOMY;  Surgeon: Katha Cabal, MD;  Location: [place] CV LAB;  Service: Cardiovascular;  Laterality: Bilateral;   THROMBECTOMY ILIAC ARTERY Left 03/12/2022   Procedure: THROMBECTOMY LEG;  Surgeon: Conrad [place], MD;  Location: [facility];  Service: Vascular;  Laterality: Left;   TRANSURETHRAL RESECTION OF BLADDER NECK N/A 03/20/2015   Procedure: RELEASE BLADDER NECK CONTRACTURE  WITH WOLF BUTTON ELECTRODE ;  Surgeon: Carolan Clines, MD;  Location: [place];  Service: Urology;  Laterality: N/A;   TRANSURETHRAL RESECTION OF BLADDER TUMOR N/A 12/05/2018   Procedure: TRANSURETHRAL RESECTION OF BLADDER TUMOR (TURBT)/CYSTOSCOPY/  INSTILLATION OF Rodell Perna;  Surgeon: Ceasar Mons, MD;  Location[facility];  Service: Urology;  Laterality: N/A;   TRANSURETHRAL RESECTION OF BLADDER TUMOR N/A 01/15/2020   Procedure: TRANSURETHRAL RESECTION OF BLADDER TUMOR (TURBT)/ CYSTOSCOPY;  Surgeon: Ceasar Mons, MD;  Location: [facility];  Service: Urology;  Laterality: N/A;   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      Patient Active Problem List   Diagnosis Date Noted   Iron deficiency anemia 06/20/2022   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   History of bladder cancer 05/19/2022   Goals of care, counseling/discussion 03/23/2022   Reactive thrombocytosis 03/21/2022   Paroxysmal atrial fibrillation with RVR ([place]) 03/20/2022   Acute bilateral deep vein thrombosis (DVT) of femoral veins (HCC) 03/20/2022   Acute respiratory failure with hypoxia (HCC) 03/16/2022   Acute on chronic diastolic CHF (congestive heart failure) ([place]) 03/16/2022   PSVT (paroxysmal supraventricular tachycardia) 03/16/2022   AKI (acute kidney injury) ([place]) 03/15/2022   Critical limb ischemia of left lower extremity with ulceration of lower leg ([place]) 03/15/2022   Arterial occlusion 03/11/2022   Elevated troponin    Unstable angina ([place]) 01/05/2021   Hyperlipidemia LDL goal <70    CKD (chronic kidney disease), stage III (HCC)    Coronary artery disease    NSTEMI (non-ST elevated myocardial infarction) ([place]) 12/23/2020   Acquired hypothyroidism 04/20/2020   Chemotherapy-induced neuropathy ([place]) 04/20/2020   Nonthrombocytopenic purpura ([place]) 12/03/2019   Hypercoagulable state ([place]) 12/10/2018   Recurrent deep vein thrombosis (DVT) (HCC)    Bilateral pulmonary  embolism ([place]) 12/07/2018   GERD (gastroesophageal reflux disease) 12/07/2018   HTN (hypertension) 12/07/2018   HLD (hyperlipidemia) 12/07/2018   Prostate cancer ([place]) 12/07/2018   Bladder cancer ([place]) 12/07/2018   DM type 2 with diabetic mixed hyperlipidemia ([place]) 04/04/2018   Pain in right hand 10/04/2017   Tubular adenoma 85/27/7824   Helicobacter pylori (H. pylori) infection 09/04/2017   Medicare annual wellness visit, initial 03/31/2017   History of prostate cancer 10/27/2014   Lower urinary tract symptoms (LUTS) 03/18/2012   Nephrolithiasis 03/18/2012    PCP: Rusty Aus, MD  REFERRING PROVIDER: Kris Hartmann, NP  REFERRING DIAG: Left below knee  amputee  THERAPY DIAG:  Left below-knee amputee [facility])  Gait difficulty  Muscle weakness (generalized)  Rationale for Evaluation and Treatment Rehabilitation  ONSET DATE: 03/16/22  SUBJECTIVE:   SUBJECTIVE STATEMENT:   EVALUATION Pt. S/p R BKA after blood clots in lower leg.  Pt. Reports minimum phantom limb symptoms and occasional muscle spasms.  Pt. Takes Baclofen to manage symptoms.  Pt. Received prosthetic leg on Wednesday from [facility].  Pt. Has h/o low back pain and has received spinal epidurals in past (2 years ago).    PERTINENT HISTORY: 1. Hx of BKA, left [facility]) Phillip [name] was seen for further evaluation for left below knee prosthesis.  The is a 76 year old male who had amputation on 03/16/2022.  At the time he is well healed and ready for fitting of new below knee prosthesis.  He is a highly motivated individual and should do well once fitted with prosthesis.  She has no problem returning to a K2 ambulator, which will allow him to walk inside and outside of his home and overload level barriers.    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  He will be having a bone marrow biopsy.  We will continue to have the patient keep his IVC filter for now as there may be further test.  We will have the patient return in 3 months to discuss filter removal.    PAIN:  Are you having pain? No  PRECAUTIONS: None  WEIGHT BEARING RESTRICTIONS No  FALLS:  Has patient fallen in last 6 months? No  LIVING ENVIRONMENT: Lives with: lives with their spouse Lives in: House/apartment Stairs: Yes: Internal: 13+ steps; on right going up Has following equipment at home: Single point cane and Walker - 2 wheeled  OCCUPATION: Retired  PLOF: [place] with walking and return to playing golf by spring.     OBJECTIVE:   PATIENT SURVEYS:  FOTO initial 40/ goal 64.    COGNITION:  Overall cognitive status:  Within functional limits for tasks assessed     SENSATION: WFL  EDEMA:  Circumferential: L/R knee joint line (34/36 cm.), calf (30.5/ 33 cm), distal quad (35/36 cm).    POSTURE: rounded shoulders and forward head  PALPATION: No tenderness/ good incision healing on residual limb  LOWER EXTREMITY ROM:  B LE WFL (all planes).  L knee 0-130 deg.   LOWER EXTREMITY MMT:  R LE muscle strength grossly 5/5 MMT except hip flexion 4+/5 MMT.  L LE strength grossly 5/5 MMT except hip flexion 4/5 MMT and hip abduction 4+/5 MMT.    FUNCTIONAL TESTS:  5 times sit to stand: 14.1 sec. And requires R UE assist.  Able to stand from chair with addition of 2" Airex pad.    GAIT: Distance walked: in clinic/ //-bars  Assistive device utilized: Environmental consultant - 2 wheeled Level of assistance: Min A Comments: Ambulates in clinic with RW and min. A/ cuing for proper technique and step pattern.  Pt. Able to ambulate in //-bars with R UE assist only and 2-point gait pattern.      TODAY'S TREATMENT:  07/05/22:  Subjective: Pt. Reports a small knot on distal lateral aspect of residual limb.  PT assessed and no concerns.    There.ex.:  Nustep L5 10 min. B UE/LE (consistent cadence)  STS from gray chair (needed UE assist)- 10x.    STS from varying heights of blue mat table.  After numerous attempts, pt. Able to stand from 17.5" height chair with no UE assist.    Gait training:  Amb. In //-bars with B UE progressing to R UE only and focused on BOS/ recip. Gait pattern.  Forward/ lateral/ backwards walking.  4 laps.  Added high marching with forward walking.   SPC in //-bars with cuing to increase R LE step length/ heel strike (mirror feedback).  Walking around PT gym with use of SPC and min./mod. A for safety and verbal cuing.  Progressing to outside walking/ curbs/ grass walking with SPC.  Pt. Requires CGA for safety/ verbal cuing.  No LOB.       PATIENT EDUCATION:  Education details: Reviewed  HEP Person educated: Patient Education method: Explanation, Demonstration, and Handouts Education comprehension: verbalized understanding and returned demonstration   HOME EXERCISE PROGRAM: See handouts.   ASSESSMENT:  CLINICAL IMPRESSION: Patient is highly motivated and hard working during standing/ walking tasks today.  Pt. Able to demonstrate a recip. Gait pattern with use of SPC but cuing to increase R LE step length/ heel strike.  Pt. Demonstrates ability to amb. On grass/ outside terrain with [facility] and CGA for safety.  No LOB during tx. session.  Pt. Will benefit from skilled PT services to increase LE muscle strength to improve independence with walking/ return to playing golf.     OBJECTIVE IMPAIRMENTS Abnormal gait, decreased activity tolerance, decreased balance, decreased endurance, decreased mobility, difficulty walking, decreased strength, impaired flexibility, improper body mechanics, postural dysfunction, and pain.   ACTIVITY LIMITATIONS carrying, lifting, bending, standing, squatting, stairs, transfers, toileting, dressing, and locomotion level  PARTICIPATION LIMITATIONS: driving, community activity, and yard work  PERSONAL FACTORS Fitness and Past/current experiences are also affecting patient's functional outcome.   REHAB POTENTIAL: Good  CLINICAL DECISION MAKING: Stable/uncomplicated  EVALUATION COMPLEXITY: Low   GOALS: Goals reviewed with patient? Yes  SHORT TERM GOALS: Target date: 07/22/22 Pt. Independent with HEP to increase B hip strength 1/2 muscle grade to improve standing/walking mod. Independence.   Baseline:  see above Goal status: INITIAL    LONG TERM GOALS: Target date: 08/19/22  Pt. Will increase FOTO to 59 to improve functional mobility.   Baseline:  initial FOTO 40 Goal status: INITIAL  2.  Pt. Able to ascend/descend 13 stairs with R handrail and consistent step pattern to improve pts. Ability to go to 2nd floor of home.   Baseline:  Goal  status: INITIAL  3.  Pt. Will ambulate with mod. I and SPC with consistent 2-point gait pattern and no loss of balance to improve independence at home/ community.   Baseline:  Goal status: INITIAL  PLAN: PT FREQUENCY: 2x/week  PT DURATION: 8 weeks  PLANNED INTERVENTIONS: Therapeutic exercises, Therapeutic activity, Neuromuscular re-education, Balance training, Gait training, Patient/Family education, Self Care, Joint mobilization, Stair training, Prosthetic training, Manual therapy, and Re-evaluation  PLAN FOR  NEXT SESSION: Progress to SPC/ stair climbing.   Pura Spice, PT, DPT # [phone] 07/10/2022, 7:21 PM

## 2022-07-10 NOTE — Therapy (Signed)
OUTPATIENT PHYSICAL THERAPY LOWER EXTREMITY TREATMENT   Patient Name: Phillip [name] MRN: [id number] DOB:12/26/1945, 76 y.o., male Today's Date: 07/07/2022   PT End of Session - 07/10/22 1957     Visit Number 5    Number of Visits 9    Date for PT Re-Evaluation 08/19/22    PT Start Time 1640    PT Stop Time 1731    PT Time Calculation (min) 51 min    Activity Tolerance Patient tolerated treatment well             Past Medical History:  Diagnosis Date   Arthritis    Basal cell carcinoma    L clavicle, L prox forearm- removed years ago    Bladder cancer (HCC)    Bladder neck contracture    CAD (coronary artery disease)    a. 12/2020 NSTEMI/Cath: LM nl, LAD 95ost/p, LCX 50p, RCA nl. EF 45-50%.   Cataract    CKD (chronic kidney disease), stage III [facility])    ED (erectile dysfunction)    Frequency    GERD (gastroesophageal reflux disease)    History of pulmonary embolus (PE) 11/2018   a. Following LE DVT-->Chronic warfarin.   Hyperlipidemia LDL goal <70    Hypertension    Hypothyroidism    Incontinence of urine    sui, s/p cryoablation   Ischemic cardiomyopathy    a. 11/2018 Echo: EF 60-65%; b. 12/2020 LV Gram: EF 45-50% in setting of NSTEMI.   Kidney stones    Neuropathy    feet   Nocturia    Prostate cancer ([place])    S/P   CRYOABLATION   Right elbow tendinitis    Right Lower Extremity DVT (deep venous thrombosis) ([place]) 11/25/2018   Vertigo    1-2x/yr   Wears glasses    Past Surgical History:  Procedure Laterality Date   AMPUTATION Left 03/16/2022   Procedure: AMPUTATION BELOW KNEE;  Surgeon: Katha Cabal, MD;  Location: [facility];  Service: Vascular;  Laterality: Left;   ARTHROPLASTY AND TENDON REPAIR LEFT THUMB  JAN 2014   CATARACT EXTRACTION W/PHACO Left 11/16/2020   Procedure: CATARACT EXTRACTION PHACO AND INTRAOCULAR LENS PLACEMENT (IOC) LEFT  7.09 00:56.4 12.6%;  Surgeon: Leandrew Koyanagi, MD;  Location: [place];  Service:  Ophthalmology;  Laterality: Left;   CATARACT EXTRACTION W/PHACO Right 12/02/2020   Procedure: CATARACT EXTRACTION PHACO AND INTRAOCULAR LENS PLACEMENT (IOC) RIGHT MALYUGIN 5.52 00:49.7 11.1%;  Surgeon: Leandrew Koyanagi, MD;  Location: [place];  Service: Ophthalmology;  Laterality: Right;   COLONOSCOPY     COLONOSCOPY WITH PROPOFOL N/A 08/09/2017   Procedure: COLONOSCOPY WITH PROPOFOL;  Surgeon: Manya Silvas, MD;  Location: [facility] ENDOSCOPY;  Service: Endoscopy;  Laterality: N/A;   CORONARY ANGIOGRAPHY N/A 12/28/2020   Procedure: CORONARY ANGIOGRAPHY;  Surgeon: Sherren Mocha, MD;  Location: [place] CV [facility];  Service: Cardiovascular;  Laterality: N/A;   CORONARY STENT INTERVENTION N/A 12/28/2020   Procedure: CORONARY STENT INTERVENTION;  Surgeon: Sherren Mocha, MD;  Location: [place] [facility];  Service: Cardiovascular;  Laterality: N/A;   ESOPHAGOGASTRODUODENOSCOPY (EGD) WITH PROPOFOL N/A 08/09/2017   Procedure: ESOPHAGOGASTRODUODENOSCOPY (EGD) WITH PROPOFOL;  Surgeon: Manya Silvas, MD;  Location: [facility] ENDOSCOPY;  Service: Endoscopy;  Laterality: N/A;   GREEN LIGHT LASER TURP (TRANSURETHRAL RESECTION OF PROSTATE N/A 12/14/2015   Procedure: GREEN LIGHT LASER TURP (TRANSURETHRAL RESECTION OF PROSTATE) LASER OF BLADDER NECK CONTRACTURE;  Surgeon: Carolan Clines, MD;  Location: [place];  Service: Urology;  Laterality: N/A;   IVC FILTER INSERTION N/A 03/19/2022   Procedure: IVC FILTER INSERTION;  Surgeon: Serafina Mitchell, MD;  Location: [place] [facility];  Service: Cardiovascular;  Laterality: N/A;   LEFT HEART CATH AND CORONARY ANGIOGRAPHY N/A 12/24/2020   Procedure: LEFT HEART CATH AND CORONARY ANGIOGRAPHY and possible PCI and stent;  Surgeon: Yolonda Kida, MD;  Location: [place] [facility];  Service: Cardiovascular;  Laterality: N/A;   LEFT HEART CATH AND CORONARY ANGIOGRAPHY N/A 01/06/2021   Procedure: LEFT HEART CATH AND CORONARY  ANGIOGRAPHY;  Surgeon: Troy Sine, MD;  Location: [place] [facility];  Service: Cardiovascular;  Laterality: N/A;   LOWER EXTREMITY ANGIOGRAPHY Left 03/11/2022   Procedure: Lower Extremity Angiography;  Surgeon: Katha Cabal, MD;  Location: [place] [facility];  Service: Cardiovascular;  Laterality: Left;   PENILE PROSTHESIS IMPLANT N/A 12/06/2013   Procedure: PENILE PROTHESIS INFLATABLE;  Surgeon: Ailene Rud, MD;  Location: [facility];  Service: Urology;  Laterality: N/A;   PROSTATE CRYOABLATION  06-01-2012  [facility]   PULMONARY THROMBECTOMY N/A 03/18/2022   Procedure: PULMONARY THROMBECTOMY;  Surgeon: Algernon Huxley, MD;  Location: [place] [facility];  Service: Cardiovascular;  Laterality: N/A;   PULMONARY THROMBECTOMY Bilateral 03/23/2022   Procedure: PULMONARY THROMBECTOMY;  Surgeon: Katha Cabal, MD;  Location: [place] [facility];  Service: Cardiovascular;  Laterality: Bilateral;   THROMBECTOMY ILIAC ARTERY Left 03/12/2022   Procedure: THROMBECTOMY LEG;  Surgeon: Conrad [place], MD;  Location: [facility];  Service: Vascular;  Laterality: Left;   TRANSURETHRAL RESECTION OF BLADDER NECK N/A 03/20/2015   Procedure: RELEASE BLADDER NECK CONTRACTURE  WITH WOLF BUTTON ELECTRODE ;  Surgeon: Carolan Clines, MD;  Location: [place];  Service: Urology;  Laterality: N/A;   TRANSURETHRAL RESECTION OF BLADDER TUMOR N/A 12/05/2018   Procedure: TRANSURETHRAL RESECTION OF BLADDER TUMOR (TURBT)/CYSTOSCOPY/  INSTILLATION OF Rodell Perna;  Surgeon: Ceasar Mons, MD;  Location: [facility];  Service: Urology;  Laterality: N/A;   TRANSURETHRAL RESECTION OF BLADDER TUMOR N/A 01/15/2020   Procedure: TRANSURETHRAL RESECTION OF BLADDER TUMOR (TURBT)/ CYSTOSCOPY;  Surgeon: Ceasar Mons, MD;  Location: [facility];  Service: Urology;  Laterality: N/A;   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      Patient Active Problem List   Diagnosis Date Noted   Iron deficiency anemia 06/20/2022   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   History of bladder cancer 05/19/2022   Goals of care, counseling/discussion 03/23/2022   Reactive thrombocytosis 03/21/2022   Paroxysmal atrial fibrillation with RVR ([place]) 03/20/2022   Acute bilateral deep vein thrombosis (DVT) of femoral veins (HCC) 03/20/2022   Acute respiratory failure with hypoxia (HCC) 03/16/2022   Acute on chronic diastolic CHF (congestive heart failure) ([place]) 03/16/2022   PSVT (paroxysmal supraventricular tachycardia) 03/16/2022   AKI (acute kidney injury) ([place]) 03/15/2022   Critical limb ischemia of left lower extremity with ulceration of lower leg ([place]) 03/15/2022   Arterial occlusion 03/11/2022   Elevated troponin    Unstable angina ([place]) 01/05/2021   Hyperlipidemia LDL goal <70    CKD (chronic kidney disease), stage III (HCC)    Coronary artery disease    NSTEMI (non-ST elevated myocardial infarction) ([place]) 12/23/2020   Acquired hypothyroidism 04/20/2020   Chemotherapy-induced neuropathy ([place]) 04/20/2020   Nonthrombocytopenic purpura ([place]) 12/03/2019   Hypercoagulable state ([place]) 12/10/2018   Recurrent deep vein thrombosis (DVT) (HCC)    Bilateral pulmonary  embolism ([place]) 12/07/2018   GERD (gastroesophageal reflux disease) 12/07/2018   HTN (hypertension) 12/07/2018   HLD (hyperlipidemia) 12/07/2018   Prostate cancer ([place]) 12/07/2018   Bladder cancer ([place]) 12/07/2018   DM type 2 with diabetic mixed hyperlipidemia ([place]) 04/04/2018   Pain in right hand 10/04/2017   Tubular adenoma 85/27/7824   Helicobacter pylori (H. pylori) infection 09/04/2017   Medicare annual wellness visit, initial 03/31/2017   History of prostate cancer 10/27/2014   Lower urinary tract symptoms (LUTS) 03/18/2012   Nephrolithiasis 03/18/2012    PCP: Rusty Aus, MD  REFERRING PROVIDER: Kris Hartmann, NP  REFERRING DIAG: Left below knee  amputee  THERAPY DIAG:  Left below-knee amputee [facility])  Gait difficulty  Muscle weakness (generalized)  Rationale for Evaluation and Treatment Rehabilitation  ONSET DATE: 03/16/22  SUBJECTIVE:   SUBJECTIVE STATEMENT:   EVALUATION Pt. S/p R BKA after blood clots in lower leg.  Pt. Reports minimum phantom limb symptoms and occasional muscle spasms.  Pt. Takes Baclofen to manage symptoms.  Pt. Received prosthetic leg on Wednesday from [facility].  Pt. Has h/o low back pain and has received spinal epidurals in past (2 years ago).    PERTINENT HISTORY: 1. Hx of BKA, left [facility]) Mr. Joakim Huesman was seen for further evaluation for left below knee prosthesis.  The is a 76 year old male who had amputation on 03/16/2022.  At the time he is well healed and ready for fitting of new below knee prosthesis.  He is a highly motivated individual and should do well once fitted with prosthesis.  She has no problem returning to a K2 ambulator, which will allow him to walk inside and outside of his home and overload level barriers.    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  He will be having a bone marrow biopsy.  We will continue to have the patient keep his IVC filter for now as there may be further test.  We will have the patient return in 3 months to discuss filter removal.    PAIN:  Are you having pain? No  PRECAUTIONS: None  WEIGHT BEARING RESTRICTIONS No  FALLS:  Has patient fallen in last 6 months? No  LIVING ENVIRONMENT: Lives with: lives with their spouse Lives in: House/apartment Stairs: Yes: Internal: 13+ steps; on right going up Has following equipment at home: Single point cane and Walker - 2 wheeled  OCCUPATION: Retired  PLOF: [place] with walking and return to playing golf by spring.     OBJECTIVE:   PATIENT SURVEYS:  FOTO initial 40/ goal 64.    COGNITION:  Overall cognitive status:  Within functional limits for tasks assessed     SENSATION: WFL  EDEMA:  Circumferential: L/R knee joint line (34/36 cm.), calf (30.5/ 33 cm), distal quad (35/36 cm).    POSTURE: rounded shoulders and forward head  PALPATION: No tenderness/ good incision healing on residual limb  LOWER EXTREMITY ROM:  B LE WFL (all planes).  L knee 0-130 deg.   LOWER EXTREMITY MMT:  R LE muscle strength grossly 5/5 MMT except hip flexion 4+/5 MMT.  L LE strength grossly 5/5 MMT except hip flexion 4/5 MMT and hip abduction 4+/5 MMT.    FUNCTIONAL TESTS:  5 times sit to stand: 14.1 sec. And requires R UE assist.  Able to stand from chair with addition of 2" Airex pad.    GAIT: Distance walked: in clinic/ //-bars  Assistive device utilized: Environmental consultant - 2 wheeled Level of assistance: Min A Comments: Ambulates in clinic with RW and min. A/ cuing for proper technique and step pattern.  Pt. Able to ambulate in //-bars with R UE assist only and 2-point gait pattern.      TODAY'S TREATMENT:  07/07/22:  Subjective: Pt. Entered PT with use of SPC and demonstrates ability to amb. With no SPC in gym.  No c/o pain.      There.ex.:  Nustep L4 10 min. B LE only (consistent cadence)  STS with light to no UE assist from gray chair 15x.    Tandem stance: <4 sec. With L foot in back of R foot (//-bars for safety).  >20 sec. With L foot in front of R.    Gait training:  Ascending/ descending stairs with step to pattern/ progressing to recip. Stairs climbing in descending with UE assist on handrails.  Standing wt. Shifting and heel strike on steps with no UE assist.   Amb. In //-bars with B UE progressing to R UE only and focused on BOS/ recip. Gait pattern.  Forward/ lateral/ backwards walking.  4 laps.  Added high marching with forward walking.   Amb. With/ without SPC in gym with cuing to increase R LE step length/ heel strike (mirror feedback).  Walking outside on varying terrain/ curbs/ grass with  SPC.  Pt. Requires CGA for safety/ verbal cuing.  No LOB.       PATIENT EDUCATION:  Education details: Reviewed HEP Person educated: Patient Education method: Explanation, Demonstration, and Handouts Education comprehension: verbalized understanding and returned demonstration   HOME EXERCISE PROGRAM: See handouts.   ASSESSMENT:  CLINICAL IMPRESSION: Patient is highly motivated and hard working during standing/ walking tasks today.  Pt. Able to demonstrate a recip. Gait pattern with use of SPC but cuing to increase R LE step length/ heel strike.  Pt. Demonstrates ability to amb. On grass/ outside terrain with [facility] and CGA for safety.  Difficulty with tandem stance with L foot behind R.  Pt. Demonstrates ability to progress gait with no assistive device.  Pt. Will benefit from skilled PT services to increase LE muscle strength to improve independence with walking/ return to playing golf.     OBJECTIVE IMPAIRMENTS Abnormal gait, decreased activity tolerance, decreased balance, decreased endurance, decreased mobility, difficulty walking, decreased strength, impaired flexibility, improper body mechanics, postural dysfunction, and pain.   ACTIVITY LIMITATIONS carrying, lifting, bending, standing, squatting, stairs, transfers, toileting, dressing, and locomotion level  PARTICIPATION LIMITATIONS: driving, community activity, and yard work  PERSONAL FACTORS Fitness and Past/current experiences are also affecting patient's functional outcome.   REHAB POTENTIAL: Good  CLINICAL DECISION MAKING: Stable/uncomplicated  EVALUATION COMPLEXITY: Low   GOALS: Goals reviewed with patient? Yes  SHORT TERM GOALS: Target date: 07/22/22 Pt. Independent with HEP to increase B hip strength 1/2 muscle grade to improve standing/walking mod. Independence.   Baseline:  see above Goal status: INITIAL    LONG TERM GOALS: Target date: 08/19/22  Pt. Will increase FOTO to 59 to improve functional mobility.    Baseline:  initial FOTO 40 Goal status: INITIAL  2.  Pt. Able to ascend/descend 13 stairs with R handrail and consistent step pattern to improve pts. Ability to go to 2nd floor of home.   Baseline:  Goal status: INITIAL  3.  Pt. Will ambulate with mod. I and SPC with consistent 2-point gait pattern and no loss of balance to improve independence at home/ community.  Baseline:  Goal status: INITIAL  PLAN: PT FREQUENCY: 2x/week  PT DURATION: 8 weeks  PLANNED INTERVENTIONS: Therapeutic exercises, Therapeutic activity, Neuromuscular re-education, Balance training, Gait training, Patient/Family education, Self Care, Joint mobilization, Stair training, Prosthetic training, Manual therapy, and Re-evaluation  PLAN FOR NEXT SESSION: Progress gait with least assistive device.    Pura Spice, PT, DPT # [phone] 07/10/2022, 7:59 PM

## 2022-07-11 ENCOUNTER — Ambulatory Visit: Payer: Medicare HMO | Admitting: Physical Therapy

## 2022-07-11 ENCOUNTER — Encounter: Payer: Self-pay | Admitting: Physical Therapy

## 2022-07-11 DIAGNOSIS — R269 Unspecified abnormalities of gait and mobility: Secondary | ICD-10-CM | POA: Diagnosis not present

## 2022-07-11 DIAGNOSIS — Z89512 Acquired absence of left leg below knee: Secondary | ICD-10-CM | POA: Diagnosis not present

## 2022-07-11 DIAGNOSIS — M6281 Muscle weakness (generalized): Secondary | ICD-10-CM | POA: Diagnosis not present

## 2022-07-11 NOTE — Therapy (Signed)
OUTPATIENT PHYSICAL THERAPY LOWER EXTREMITY TREATMENT   Patient Name: Phillip [name] MRN: [id number] DOB:04-18-1946, 76 y.o., male Today's Date: 07/11/2022   PT End of Session - 07/11/22 0946     Visit Number 6    Number of Visits 9    Date for PT Re-Evaluation 08/19/22    PT Start Time 0946    PT Stop Time 5188    PT Time Calculation (min) 49 min    Activity Tolerance Patient tolerated treatment well             Past Medical History:  Diagnosis Date   Arthritis    Basal cell carcinoma    L clavicle, L prox forearm- removed years ago    Bladder cancer ([place])    Bladder neck contracture    CAD (coronary artery disease)    a. 12/2020 NSTEMI/Cath: LM nl, LAD 95ost/p, LCX 50p, RCA nl. EF 45-50%.   Cataract    CKD (chronic kidney disease), stage III [facility])    ED (erectile dysfunction)    Frequency    GERD (gastroesophageal reflux disease)    History of pulmonary embolus (PE) 11/2018   a. Following LE DVT-->Chronic warfarin.   Hyperlipidemia LDL goal <70    Hypertension    Hypothyroidism    Incontinence of urine    sui, s/p cryoablation   Ischemic cardiomyopathy    a. 11/2018 Echo: EF 60-65%; b. 12/2020 LV Gram: EF 45-50% in setting of NSTEMI.   Kidney stones    Neuropathy    feet   Nocturia    Prostate cancer ([place])    S/P   CRYOABLATION   Right elbow tendinitis    Right Lower Extremity DVT (deep venous thrombosis) ([place]) 11/25/2018   Vertigo    1-2x/yr   Wears glasses    Past Surgical History:  Procedure Laterality Date   AMPUTATION Left 03/16/2022   Procedure: AMPUTATION BELOW KNEE;  Surgeon: Katha Cabal, MD;  Location: [facility] ORS;  Service: Vascular;  Laterality: Left;   ARTHROPLASTY AND TENDON REPAIR LEFT THUMB  JAN 2014   CATARACT EXTRACTION W/PHACO Left 11/16/2020   Procedure: CATARACT EXTRACTION PHACO AND INTRAOCULAR LENS PLACEMENT (IOC) LEFT  7.09 00:56.4 12.6%;  Surgeon: Leandrew Koyanagi, MD;  Location: [place];  Service:  Ophthalmology;  Laterality: Left;   CATARACT EXTRACTION W/PHACO Right 12/02/2020   Procedure: CATARACT EXTRACTION PHACO AND INTRAOCULAR LENS PLACEMENT (IOC) RIGHT MALYUGIN 5.52 00:49.7 11.1%;  Surgeon: Leandrew Koyanagi, MD;  Location: [place];  Service: Ophthalmology;  Laterality: Right;   COLONOSCOPY     COLONOSCOPY WITH PROPOFOL N/A 08/09/2017   Procedure: COLONOSCOPY WITH PROPOFOL;  Surgeon: Manya Silvas, MD;  Location[facility] ENDOSCOPY;  Service: Endoscopy;  Laterality: N/A;   CORONARY ANGIOGRAPHY N/A 12/28/2020   Procedure: CORONARY ANGIOGRAPHY;  Surgeon: Sherren Mocha, MD;  Location: [place] CV LAB;  Service: Cardiovascular;  Laterality: N/A;   CORONARY STENT INTERVENTION N/A 12/28/2020   Procedure: CORONARY STENT INTERVENTION;  Surgeon: Sherren Mocha, MD;  Location: [place] CV LAB;  Service: Cardiovascular;  Laterality: N/A;   ESOPHAGOGASTRODUODENOSCOPY (EGD) WITH PROPOFOL N/A 08/09/2017   Procedure: ESOPHAGOGASTRODUODENOSCOPY (EGD) WITH PROPOFOL;  Surgeon: Manya Silvas, MD;  Location: [facility] ENDOSCOPY;  Service: Endoscopy;  Laterality: N/A;   GREEN LIGHT LASER TURP (TRANSURETHRAL RESECTION OF PROSTATE N/A 12/14/2015   Procedure: GREEN LIGHT LASER TURP (TRANSURETHRAL RESECTION OF PROSTATE) LASER OF BLADDER NECK CONTRACTURE;  Surgeon: Carolan Clines, MD;  Location: [place];  Service: Urology;  Laterality: N/A;   IVC FILTER INSERTION N/A 03/19/2022   Procedure: IVC FILTER INSERTION;  Surgeon: Serafina Mitchell, MD;  Location: [place] CV LAB;  Service: Cardiovascular;  Laterality: N/A;   LEFT HEART CATH AND CORONARY ANGIOGRAPHY N/A 12/24/2020   Procedure: LEFT HEART CATH AND CORONARY ANGIOGRAPHY and possible PCI and stent;  Surgeon: Yolonda Kida, MD;  Location: [place] CV LAB;  Service: Cardiovascular;  Laterality: N/A;   LEFT HEART CATH AND CORONARY ANGIOGRAPHY N/A 01/06/2021   Procedure: LEFT HEART CATH AND CORONARY  ANGIOGRAPHY;  Surgeon: Troy Sine, MD;  Location: [place] CV LAB;  Service: Cardiovascular;  Laterality: N/A;   LOWER EXTREMITY ANGIOGRAPHY Left 03/11/2022   Procedure: Lower Extremity Angiography;  Surgeon: Katha Cabal, MD;  Location: [place] CV LAB;  Service: Cardiovascular;  Laterality: Left;   PENILE PROSTHESIS IMPLANT N/A 12/06/2013   Procedure: PENILE PROTHESIS INFLATABLE;  Surgeon: Ailene Rud, MD;  Location: [facility];  Service: Urology;  Laterality: N/A;   PROSTATE CRYOABLATION  06-01-2012  [facility]   PULMONARY THROMBECTOMY N/A 03/18/2022   Procedure: PULMONARY THROMBECTOMY;  Surgeon: Algernon Huxley, MD;  Location: [place] CV LAB;  Service: Cardiovascular;  Laterality: N/A;   PULMONARY THROMBECTOMY Bilateral 03/23/2022   Procedure: PULMONARY THROMBECTOMY;  Surgeon: Katha Cabal, MD;  Location: [place] CV LAB;  Service: Cardiovascular;  Laterality: Bilateral;   THROMBECTOMY ILIAC ARTERY Left 03/12/2022   Procedure: THROMBECTOMY LEG;  Surgeon: Conrad [place], MD;  Location: [facility] ORS;  Service: Vascular;  Laterality: Left;   TRANSURETHRAL RESECTION OF BLADDER NECK N/A 03/20/2015   Procedure: RELEASE BLADDER NECK CONTRACTURE  WITH WOLF BUTTON ELECTRODE ;  Surgeon: Carolan Clines, MD;  Location: [place];  Service: Urology;  Laterality: N/A;   TRANSURETHRAL RESECTION OF BLADDER TUMOR N/A 12/05/2018   Procedure: TRANSURETHRAL RESECTION OF BLADDER TUMOR (TURBT)/CYSTOSCOPY/  INSTILLATION OF Rodell Perna;  Surgeon: Ceasar Mons, MD;  Location[facility];  Service: Urology;  Laterality: N/A;   TRANSURETHRAL RESECTION OF BLADDER TUMOR N/A 01/15/2020   Procedure: TRANSURETHRAL RESECTION OF BLADDER TUMOR (TURBT)/ CYSTOSCOPY;  Surgeon: Ceasar Mons, MD;  Location: [facility];  Service: Urology;  Laterality: N/A;   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      Patient Active Problem List   Diagnosis Date Noted   Iron deficiency anemia 06/20/2022   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   History of bladder cancer 05/19/2022   Goals of care, counseling/discussion 03/23/2022   Reactive thrombocytosis 03/21/2022   Paroxysmal atrial fibrillation with RVR ([place]) 03/20/2022   Acute bilateral deep vein thrombosis (DVT) of femoral veins (HCC) 03/20/2022   Acute respiratory failure with hypoxia (HCC) 03/16/2022   Acute on chronic diastolic CHF (congestive heart failure) ([place]) 03/16/2022   PSVT (paroxysmal supraventricular tachycardia) 03/16/2022   AKI (acute kidney injury) ([place]) 03/15/2022   Critical limb ischemia of left lower extremity with ulceration of lower leg ([place]) 03/15/2022   Arterial occlusion 03/11/2022   Elevated troponin    Unstable angina ([place]) 01/05/2021   Hyperlipidemia LDL goal <70    CKD (chronic kidney disease), stage III (HCC)    Coronary artery disease    NSTEMI (non-ST elevated myocardial infarction) ([place]) 12/23/2020   Acquired hypothyroidism 04/20/2020   Chemotherapy-induced neuropathy ([place]) 04/20/2020   Nonthrombocytopenic purpura ([place]) 12/03/2019   Hypercoagulable state ([place]) 12/10/2018   Recurrent deep vein thrombosis (DVT) (HCC)    Bilateral pulmonary  embolism ([place]) 12/07/2018   GERD (gastroesophageal reflux disease) 12/07/2018   HTN (hypertension) 12/07/2018   HLD (hyperlipidemia) 12/07/2018   Prostate cancer ([place]) 12/07/2018   Bladder cancer ([place]) 12/07/2018   DM type 2 with diabetic mixed hyperlipidemia ([place]) 04/04/2018   Pain in right hand 10/04/2017   Tubular adenoma 85/27/7824   Helicobacter pylori (H. pylori) infection 09/04/2017   Medicare annual wellness visit, initial 03/31/2017   History of prostate cancer 10/27/2014   Lower urinary tract symptoms (LUTS) 03/18/2012   Nephrolithiasis 03/18/2012    PCP: Rusty Aus, MD  REFERRING PROVIDER: Kris Hartmann, NP  REFERRING DIAG: Left below knee  amputee  THERAPY DIAG:  Left below-knee amputee [facility])  Gait difficulty  Muscle weakness (generalized)  Rationale for Evaluation and Treatment Rehabilitation  ONSET DATE: 03/16/22  SUBJECTIVE:   SUBJECTIVE STATEMENT:   EVALUATION Pt. S/p R BKA after blood clots in lower leg.  Pt. Reports minimum phantom limb symptoms and occasional muscle spasms.  Pt. Takes Baclofen to manage symptoms.  Pt. Received prosthetic leg on Wednesday from [facility].  Pt. Has h/o low back pain and has received spinal epidurals in past (2 years ago).    PERTINENT HISTORY: 1. Hx of BKA, left [facility]) Mr. Joakim Huesman was seen for further evaluation for left below knee prosthesis.  The is a 76 year old male who had amputation on 03/16/2022.  At the time he is well healed and ready for fitting of new below knee prosthesis.  He is a highly motivated individual and should do well once fitted with prosthesis.  She has no problem returning to a K2 ambulator, which will allow him to walk inside and outside of his home and overload level barriers.    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  He will be having a bone marrow biopsy.  We will continue to have the patient keep his IVC filter for now as there may be further test.  We will have the patient return in 3 months to discuss filter removal.    PAIN:  Are you having pain? No  PRECAUTIONS: None  WEIGHT BEARING RESTRICTIONS No  FALLS:  Has patient fallen in last 6 months? No  LIVING ENVIRONMENT: Lives with: lives with their spouse Lives in: House/apartment Stairs: Yes: Internal: 13+ steps; on right going up Has following equipment at home: Single point cane and Walker - 2 wheeled  OCCUPATION: Retired  PLOF: [place] with walking and return to playing golf by spring.     OBJECTIVE:   PATIENT SURVEYS:  FOTO initial 40/ goal 64.    COGNITION:  Overall cognitive status:  Within functional limits for tasks assessed     SENSATION: WFL  EDEMA:  Circumferential: L/R knee joint line (34/36 cm.), calf (30.5/ 33 cm), distal quad (35/36 cm).    POSTURE: rounded shoulders and forward head  PALPATION: No tenderness/ good incision healing on residual limb  LOWER EXTREMITY ROM:  B LE WFL (all planes).  L knee 0-130 deg.   LOWER EXTREMITY MMT:  R LE muscle strength grossly 5/5 MMT except hip flexion 4+/5 MMT.  L LE strength grossly 5/5 MMT except hip flexion 4/5 MMT and hip abduction 4+/5 MMT.    FUNCTIONAL TESTS:  5 times sit to stand: 14.1 sec. And requires R UE assist.  Able to stand from chair with addition of 2" Airex pad.    GAIT: Distance walked: in clinic/ //-bars  Assistive device utilized: Environmental consultant - 2 wheeled Level of assistance: Min A Comments: Ambulates in clinic with RW and min. A/ cuing for proper technique and step pattern.  Pt. Able to ambulate in //-bars with R UE assist only and 2-point gait pattern.      TODAY'S TREATMENT:  07/11/22:  Subjective: Pt. Entered PT with use of SPC and demonstrates ability to amb. With no SPC in gym.  No c/o pain.   Pt. Wearing prosthetic leg increase times over weekend with church/ lunch/ household tasks.    There.ex.:  Resisted gait 2BTB 5x all 4-planes with light UE assist and mirror feedback.    Nustep L4 10 min. B LE only (consistent cadence)  STS with light to no UE assist from gray chair 10x.     Simulate golf swing outside of //-bars with no UE assist.  Mirror feedback.      Gait training:  Amb. In //-bars with B UE progressing to no UE and focused on BOS/ recip. Gait pattern.  Forward/ lateral/ backwards walking.  4 laps.  Added high marching with forward walking.   Walking with SPC in gym maneuvering cones (narrow BOS to challenge balance).  Attempted without SPC but requires CGA for safety with narrow openings.    Ascending/ descending stairs with step to pattern/ progressing to recip.  Stairs climbing in descending with UE assist on handrails.  Standing wt. Shifting and heel strike on steps with no UE assist.   Amb. With/ without SPC in gym with cuing to increase R LE step length/ heel strike (mirror feedback).  Walking outside on varying terrain/ curbs/ grass with SPC.  Pt. Requires CGA for safety/ verbal cuing.  No LOB.       PATIENT EDUCATION:  Education details: Reviewed HEP Person educated: Patient Education method: Explanation, Demonstration, and Handouts Education comprehension: verbalized understanding and returned demonstration   HOME EXERCISE PROGRAM: See handouts.   ASSESSMENT:  CLINICAL IMPRESSION: Patient is highly motivated and hard working during standing/ walking tasks today.  Pt. Able to demonstrate a recip. Gait pattern with use of SPC but cuing to increase R LE step length/ heel strike.  Pt. Demonstrates ability to amb. On grass/ outside terrain with [facility] and CGA for safety.  Difficulty with tandem stance with L foot behind R.  Pt. Demonstrates ability to progress gait with no assistive device.  Pt. Will benefit from skilled PT services to increase LE muscle strength to improve independence with walking/ return to playing golf.     OBJECTIVE IMPAIRMENTS Abnormal gait, decreased activity tolerance, decreased balance, decreased endurance, decreased mobility, difficulty walking, decreased strength, impaired flexibility, improper body mechanics, postural dysfunction, and pain.   ACTIVITY LIMITATIONS carrying, lifting, bending, standing, squatting, stairs, transfers, toileting, dressing, and locomotion level  PARTICIPATION LIMITATIONS: driving, community activity, and yard work  PERSONAL FACTORS Fitness and Past/current experiences are also affecting patient's functional outcome.   REHAB POTENTIAL: Good  CLINICAL DECISION MAKING: Stable/uncomplicated  EVALUATION COMPLEXITY: Low   GOALS: Goals reviewed with patient? Yes  SHORT TERM GOALS:  Target date: 07/22/22 Pt. Independent with HEP to increase B hip strength 1/2 muscle grade to improve standing/walking mod. Independence.   Baseline:  see above Goal status: INITIAL    LONG TERM GOALS: Target date: 08/19/22  Pt. Will increase FOTO to 59 to improve functional mobility.   Baseline:  initial FOTO 40 Goal status: INITIAL  2.  Pt. Able to ascend/descend 13 stairs with R handrail and consistent step pattern to  improve pts. Ability to go to 2nd floor of home.   Baseline:  Goal status: INITIAL  3.  Pt. Will ambulate with mod. I and SPC with consistent 2-point gait pattern and no loss of balance to improve independence at home/ community.   Baseline:  Goal status: INITIAL  PLAN: PT FREQUENCY: 2x/week  PT DURATION: 8 weeks  PLANNED INTERVENTIONS: Therapeutic exercises, Therapeutic activity, Neuromuscular re-education, Balance training, Gait training, Patient/Family education, Self Care, Joint mobilization, Stair training, Prosthetic training, Manual therapy, and Re-evaluation  PLAN FOR NEXT SESSION: Progress gait with least assistive device.    Pura Spice, PT, DPT # [phone] 07/11/2022, 7:22 PM

## 2022-07-13 ENCOUNTER — Ambulatory Visit (INDEPENDENT_AMBULATORY_CARE_PROVIDER_SITE_OTHER): Payer: Medicare HMO | Admitting: Dermatology

## 2022-07-13 DIAGNOSIS — D229 Melanocytic nevi, unspecified: Secondary | ICD-10-CM

## 2022-07-13 DIAGNOSIS — L821 Other seborrheic keratosis: Secondary | ICD-10-CM | POA: Diagnosis not present

## 2022-07-13 DIAGNOSIS — L570 Actinic keratosis: Secondary | ICD-10-CM

## 2022-07-13 DIAGNOSIS — L814 Other melanin hyperpigmentation: Secondary | ICD-10-CM | POA: Diagnosis not present

## 2022-07-13 DIAGNOSIS — L578 Other skin changes due to chronic exposure to nonionizing radiation: Secondary | ICD-10-CM

## 2022-07-13 DIAGNOSIS — Z85828 Personal history of other malignant neoplasm of skin: Secondary | ICD-10-CM

## 2022-07-13 DIAGNOSIS — L820 Inflamed seborrheic keratosis: Secondary | ICD-10-CM | POA: Diagnosis not present

## 2022-07-13 DIAGNOSIS — Z1283 Encounter for screening for malignant neoplasm of skin: Secondary | ICD-10-CM

## 2022-07-13 DIAGNOSIS — D692 Other nonthrombocytopenic purpura: Secondary | ICD-10-CM

## 2022-07-13 NOTE — Patient Instructions (Signed)
Recommend Dermend lotion for bruising of arms.  Cryotherapy Aftercare  Wash gently with soap and water everyday.   Apply Vaseline and Band-Aid daily until healed.    Due to recent changes in healthcare laws, you may see results of your pathology and/or laboratory studies on MyChart before the doctors have had a chance to review them. We understand that in some cases there may be results that are confusing or concerning to you. Please understand that not all results are received at the same time and often the doctors may need to interpret multiple results in order to provide you with the best plan of care or course of treatment. Therefore, we ask that you please give [place] 2 business days to thoroughly review all your results before contacting the office for clarification. Should we see a critical lab result, you will be contacted sooner.   If You Need Anything After Your Visit  If you have any questions or concerns for your doctor, please call our main line at [phone] and press option 4 to reach your doctor's medical assistant. If no one answers, please leave a voicemail as directed and we will return your call as soon as possible. Messages left after 4 pm will be answered the following business day.   You may also send [place] a message via [place]. We typically respond to MyChart messages within 1-2 business days.  For prescription refills, please ask your pharmacy to contact our office. Our fax number is [phone].  If you have an urgent issue when the clinic is closed that cannot wait until the next business day, you can page your doctor at the number below.    Please note that while we do our best to be available for urgent issues outside of office hours, we are not available 24/7.   If you have an urgent issue and are unable to reach [place], you may choose to seek medical care at your doctor's office, retail clinic, urgent care center, or emergency room.  If you have a medical emergency,  please immediately call 911 or go to the emergency department.  Pager Numbers  - Dr. Nehemiah Massed: [phone]  - Dr. Laurence Ferrari: [phone]  - Dr. Nicole Kindred: [phone]  In the event of inclement weather, please call our main line at [phone] for an update on the status of any delays or closures.  Dermatology Medication Tips: Please keep the boxes that topical medications come in in order to help keep track of the instructions about where and how to use these. Pharmacies typically print the medication instructions only on the boxes and not directly on the medication tubes.   If your medication is too expensive, please contact our office at [phone] option 4 or send [place] a message through [place].   We are unable to tell what your co-pay for medications will be in advance as this is different depending on your insurance coverage. However, we may be able to find a substitute medication at lower cost or fill out paperwork to get insurance to cover a needed medication.   If a prior authorization is required to get your medication covered by your insurance company, please allow [place] 1-2 business days to complete this process.  Drug prices often vary depending on where the prescription is filled and some pharmacies may offer cheaper prices.  The website www.goodrx.com contains coupons for medications through different pharmacies. The prices here do not account for what the cost may be with help from insurance (it may be cheaper  with your insurance), but the website can give you the price if you did not use any insurance.  - You can print the associated coupon and take it with your prescription to the pharmacy.  - You may also stop by our office during regular business hours and pick up a GoodRx coupon card.  - If you need your prescription sent electronically to a different pharmacy, notify our office through [facility] or by phone at [phone] option 4.     Si Usted Necesita Algo  Despus de Su Visita  Tambin puede enviarnos un mensaje a travs de Pharmacist, community. Por lo general respondemos a los mensajes de MyChart en el transcurso de 1 a 2 das hbiles.  Para renovar recetas, por favor pida a su farmacia que se ponga en contacto con nuestra oficina. Harland Dingwall de fax es [place] [phone].  Si tiene un asunto urgente cuando la clnica est cerrada y que no puede esperar hasta el siguiente da hbil, puede llamar/localizar a su doctor(a) al nmero que aparece a continuacin.   Por favor, tenga en cuenta que aunque hacemos todo lo posible para estar disponibles para asuntos urgentes fuera del horario de [place], no estamos disponibles las 24 horas del da, los 7 das de la [place].   Si tiene un problema urgente y no puede comunicarse con nosotros, puede optar por buscar atencin mdica  en el consultorio de su doctor(a), en una clnica privada, en un centro de atencin urgente o en una sala de emergencias.  Si tiene Engineering geologist, por favor llame inmediatamente al 911 o vaya a la sala de emergencias.  Nmeros de bper  - Dr. Nehemiah Massed: [phone]  - Dra. Moye: [phone]  - Dra. Nicole Kindred: [phone]  En caso de inclemencias del [place], por favor llame a Johnsie Kindred principal al [phone] para una actualizacin sobre el [place] de cualquier retraso o cierre.  Consejos para la medicacin en dermatologa: Por favor, guarde las cajas en las que vienen los medicamentos de uso tpico para ayudarle a seguir las instrucciones sobre dnde y cmo usarlos. Las farmacias generalmente imprimen las instrucciones del medicamento slo en las cajas y no directamente en los tubos del [place].   Si su medicamento es muy caro, por favor, pngase en contacto con Zigmund Daniel llamando al [phone] y presione la opcin 4 o envenos un mensaje a travs de Pharmacist, community.   No podemos decirle cul ser su copago por los medicamentos por adelantado ya que esto es diferente  dependiendo de la cobertura de su seguro. Sin embargo, es posible que podamos encontrar un medicamento sustituto a Electrical engineer un formulario para que el seguro cubra el medicamento que se considera necesario.   Si se requiere una autorizacin previa para que su compaa de seguros [place] su medicamento, por favor permtanos de 1 a 2 das hbiles para completar este proceso.  Los precios de los medicamentos varan con frecuencia dependiendo del Environmental consultant de dnde se surte la receta y alguna farmacias pueden ofrecer precios ms baratos.  El sitio web www.goodrx.com tiene cupones para medicamentos de Airline pilot. Los precios aqu no tienen en cuenta lo que podra costar con la ayuda del seguro (puede ser ms barato con su seguro), pero el sitio web puede darle el precio si no utiliz Research scientist (physical sciences).  - Puede imprimir el cupn correspondiente y llevarlo con su receta a la farmacia.  - Tambin puede pasar por nuestra oficina durante el horario de atencin regular y Corporate treasurer  tarjeta de cupones de GoodRx.  - Si necesita que su receta se enve electrnicamente a una farmacia diferente, informe a nuestra oficina a travs de MyChart de [place] o por telfono llamando al [phone] y presione la opcin 4.

## 2022-07-13 NOTE — Progress Notes (Signed)
Follow-Up Visit   Subjective  Phillip [name] is a 76 y.o. male who presents for the following: Annual Exam (History of BCC - The patient presents for Total-Body Skin Exam (TBSE) for skin cancer screening and mole check.  The patient has spots, moles and lesions to be evaluated, some may be new or changing and the patient has concerns that these could be cancer./).  The following portions of the chart were reviewed this encounter and updated as appropriate:   Tobacco  Allergies  Meds  Problems  Med Hx  Surg Hx  Fam Hx     Review of Systems:  No other skin or systemic complaints except as noted in HPI or Assessment and Plan.  Objective  Well appearing patient in no apparent distress; mood and affect are within normal limits.  A full examination was performed including scalp, head, eyes, ears, nose, lips, neck, chest, axillae, abdomen, back, buttocks, bilateral upper extremities, bilateral lower extremities, hands, feet, fingers, toes, fingernails, and toenails. All findings within normal limits unless otherwise noted below.  Left temple x 2, left ear x 1 (3) Erythematous thin papules/macules with gritty scale.   Neck x 7, right thigh x 1 (8) Erythematous stuck-on, waxy papule or plaque   Assessment & Plan   History of Basal Cell Carcinoma of the Skin - No evidence of recurrence today - Recommend regular full body skin exams - Recommend daily broad spectrum sunscreen SPF 30+ to sun-exposed areas, reapply every 2 hours as needed.  - Call if any new or changing lesions are noted between office visits  Purpura - Chronic; persistent and recurrent.  Treatable, but not curable. - Violaceous macules and patches - Benign - Related to trauma, age, sun damage and/or use of blood thinners, chronic use of topical and/or oral steroids - Observe - Can use OTC arnica containing moisturizer such as Dermend Bruise Formula if desired - Call for worsening or other concerns  Lentigines -  Scattered tan macules - Due to sun exposure - Benign-appearing, observe - Recommend daily broad spectrum sunscreen SPF 30+ to sun-exposed areas, reapply every 2 hours as needed. - Call for any changes  Seborrheic Keratoses - Stuck-on, waxy, tan-brown papules and/or plaques  - Benign-appearing - Discussed benign etiology and prognosis. - Observe - Call for any changes  Melanocytic Nevi - Tan-brown and/or pink-flesh-colored symmetric macules and papules - Benign appearing on exam today - Observation - Call clinic for new or changing moles - Recommend daily use of broad spectrum spf 30+ sunscreen to sun-exposed areas.   Hemangiomas - Red papules - Discussed benign nature - Observe - Call for any changes  Actinic Damage - Chronic condition, secondary to cumulative UV/sun exposure - diffuse scaly erythematous macules with underlying dyspigmentation - Recommend daily broad spectrum sunscreen SPF 30+ to sun-exposed areas, reapply every 2 hours as needed.  - Staying in the shade or wearing long sleeves, sun glasses (UVA+UVB protection) and wide brim hats (4-inch brim around the entire circumference of the hat) are also recommended for sun protection.  - Call for new or changing lesions.  Skin cancer screening performed today.  AK (actinic keratosis) (3) Left temple x 2, left ear x 1  Destruction of lesion - Left temple x 2, left ear x 1 Complexity: simple   Destruction method: cryotherapy   Informed consent: discussed and consent obtained   Timeout:  patient name, date of birth, surgical site, and procedure verified Lesion destroyed using liquid nitrogen: Yes  Region frozen until ice ball extended beyond lesion: Yes   Outcome: patient tolerated procedure well with no complications   Post-procedure details: wound care instructions given    Inflamed seborrheic keratosis (8) Neck x 7, right thigh x 1  Destruction of lesion - Neck x 7, right thigh x 1 Complexity: simple    Destruction method: cryotherapy   Informed consent: discussed and consent obtained   Timeout:  patient name, date of birth, surgical site, and procedure verified Lesion destroyed using liquid nitrogen: Yes   Region frozen until ice ball extended beyond lesion: Yes   Outcome: patient tolerated procedure well with no complications   Post-procedure details: wound care instructions given     Return in about 1 year (around 07/14/2023) for TBSE.  I, Ashok Cordia, CMA, am acting as scribe for Sarina Ser, MD . Documentation: I have reviewed the above documentation for accuracy and completeness, and I agree with the above.  Sarina Ser, MD

## 2022-07-14 ENCOUNTER — Ambulatory Visit: Payer: Medicare HMO | Admitting: Physical Therapy

## 2022-07-14 DIAGNOSIS — Z89512 Acquired absence of left leg below knee: Secondary | ICD-10-CM | POA: Diagnosis not present

## 2022-07-14 DIAGNOSIS — M6281 Muscle weakness (generalized): Secondary | ICD-10-CM

## 2022-07-14 DIAGNOSIS — R269 Unspecified abnormalities of gait and mobility: Secondary | ICD-10-CM

## 2022-07-14 NOTE — Therapy (Signed)
OUTPATIENT PHYSICAL THERAPY LOWER EXTREMITY TREATMENT   Patient Name: Phillip [name] MRN: [id number] DOB:Dec 22, 1945, 76 y.o., male Today's Date: 07/14/2022   PT End of Session - 07/14/22 0858     Visit Number 7    Number of Visits 9    Date for PT Re-Evaluation 08/19/22    PT Start Time 0858    PT Stop Time 0949    PT Time Calculation (min) 51 min    Activity Tolerance Patient tolerated treatment well             Past Medical History:  Diagnosis Date   Arthritis    Basal cell carcinoma    L clavicle, L prox forearm- removed years ago    Bladder cancer [facility])    Bladder neck contracture    CAD (coronary artery disease)    a. 12/2020 NSTEMI/Cath: LM nl, LAD 95ost/p, LCX 50p, RCA nl. EF 45-50%.   Cataract    CKD (chronic kidney disease), stage III [facility])    ED (erectile dysfunction)    Frequency    GERD (gastroesophageal reflux disease)    History of pulmonary embolus (PE) 11/2018   a. Following LE DVT-->Chronic warfarin.   Hyperlipidemia LDL goal <70    Hypertension    Hypothyroidism    Incontinence of urine    sui, s/p cryoablation   Ischemic cardiomyopathy    a. 11/2018 Echo: EF 60-65%; b. 12/2020 LV Gram: EF 45-50% in setting of NSTEMI.   Kidney stones    Neuropathy    feet   Nocturia    Prostate cancer ([place])    S/P   CRYOABLATION   Right elbow tendinitis    Right Lower Extremity DVT (deep venous thrombosis) ([place]) 11/25/2018   Vertigo    1-2x/yr   Wears glasses    Past Surgical History:  Procedure Laterality Date   AMPUTATION Left 03/16/2022   Procedure: AMPUTATION BELOW KNEE;  Surgeon: Katha Cabal, MD;  Location: [facility] ORS;  Service: Vascular;  Laterality: Left;   ARTHROPLASTY AND TENDON REPAIR LEFT THUMB  JAN 2014   CATARACT EXTRACTION W/PHACO Left 11/16/2020   Procedure: CATARACT EXTRACTION PHACO AND INTRAOCULAR LENS PLACEMENT (IOC) LEFT  7.09 00:56.4 12.6%;  Surgeon: Leandrew Koyanagi, MD;  Location: [place];  Service:  Ophthalmology;  Laterality: Left;   CATARACT EXTRACTION W/PHACO Right 12/02/2020   Procedure: CATARACT EXTRACTION PHACO AND INTRAOCULAR LENS PLACEMENT (IOC) RIGHT MALYUGIN 5.52 00:49.7 11.1%;  Surgeon: Leandrew Koyanagi, MD;  Location: [place];  Service: Ophthalmology;  Laterality: Right;   COLONOSCOPY     COLONOSCOPY WITH PROPOFOL N/A 08/09/2017   Procedure: COLONOSCOPY WITH PROPOFOL;  Surgeon: Manya Silvas, MD;  Location: [facility] ENDOSCOPY;  Service: Endoscopy;  Laterality: N/A;   CORONARY ANGIOGRAPHY N/A 12/28/2020   Procedure: CORONARY ANGIOGRAPHY;  Surgeon: Sherren Mocha, MD;  Location: [place] CV LAB;  Service: Cardiovascular;  Laterality: N/A;   CORONARY STENT INTERVENTION N/A 12/28/2020   Procedure: CORONARY STENT INTERVENTION;  Surgeon: Sherren Mocha, MD;  Location: [place] CV LAB;  Service: Cardiovascular;  Laterality: N/A;   ESOPHAGOGASTRODUODENOSCOPY (EGD) WITH PROPOFOL N/A 08/09/2017   Procedure: ESOPHAGOGASTRODUODENOSCOPY (EGD) WITH PROPOFOL;  Surgeon: Manya Silvas, MD;  Location: [facility] ENDOSCOPY;  Service: Endoscopy;  Laterality: N/A;   GREEN LIGHT LASER TURP (TRANSURETHRAL RESECTION OF PROSTATE N/A 12/14/2015   Procedure: GREEN LIGHT LASER TURP (TRANSURETHRAL RESECTION OF PROSTATE) LASER OF BLADDER NECK CONTRACTURE;  Surgeon: Carolan Clines, MD;  Location: [place];  Service: Urology;  Laterality: N/A;   IVC FILTER INSERTION N/A 03/19/2022   Procedure: IVC FILTER INSERTION;  Surgeon: Serafina Mitchell, MD;  Location: [place] CV LAB;  Service: Cardiovascular;  Laterality: N/A;   LEFT HEART CATH AND CORONARY ANGIOGRAPHY N/A 12/24/2020   Procedure: LEFT HEART CATH AND CORONARY ANGIOGRAPHY and possible PCI and stent;  Surgeon: Yolonda Kida, MD;  Location: [place] CV LAB;  Service: Cardiovascular;  Laterality: N/A;   LEFT HEART CATH AND CORONARY ANGIOGRAPHY N/A 01/06/2021   Procedure: LEFT HEART CATH AND CORONARY  ANGIOGRAPHY;  Surgeon: Troy Sine, MD;  Location: [place] CV LAB;  Service: Cardiovascular;  Laterality: N/A;   LOWER EXTREMITY ANGIOGRAPHY Left 03/11/2022   Procedure: Lower Extremity Angiography;  Surgeon: Katha Cabal, MD;  Location: [place] CV LAB;  Service: Cardiovascular;  Laterality: Left;   PENILE PROSTHESIS IMPLANT N/A 12/06/2013   Procedure: PENILE PROTHESIS INFLATABLE;  Surgeon: Ailene Rud, MD;  Location: [facility];  Service: Urology;  Laterality: N/A;   PROSTATE CRYOABLATION  06-01-2012  [facility]   PULMONARY THROMBECTOMY N/A 03/18/2022   Procedure: PULMONARY THROMBECTOMY;  Surgeon: Algernon Huxley, MD;  Location: [place] CV LAB;  Service: Cardiovascular;  Laterality: N/A;   PULMONARY THROMBECTOMY Bilateral 03/23/2022   Procedure: PULMONARY THROMBECTOMY;  Surgeon: Katha Cabal, MD;  Location: [place] CV LAB;  Service: Cardiovascular;  Laterality: Bilateral;   THROMBECTOMY ILIAC ARTERY Left 03/12/2022   Procedure: THROMBECTOMY LEG;  Surgeon: Conrad [place], MD;  Location: [facility] ORS;  Service: Vascular;  Laterality: Left;   TRANSURETHRAL RESECTION OF BLADDER NECK N/A 03/20/2015   Procedure: RELEASE BLADDER NECK CONTRACTURE  WITH WOLF BUTTON ELECTRODE ;  Surgeon: Carolan Clines, MD;  Location: [place];  Service: Urology;  Laterality: N/A;   TRANSURETHRAL RESECTION OF BLADDER TUMOR N/A 12/05/2018   Procedure: TRANSURETHRAL RESECTION OF BLADDER TUMOR (TURBT)/CYSTOSCOPY/  INSTILLATION OF Rodell Perna;  Surgeon: Ceasar Mons, MD;  Location[facility];  Service: Urology;  Laterality: N/A;   TRANSURETHRAL RESECTION OF BLADDER TUMOR N/A 01/15/2020   Procedure: TRANSURETHRAL RESECTION OF BLADDER TUMOR (TURBT)/ CYSTOSCOPY;  Surgeon: Ceasar Mons, MD;  Location: [facility];  Service: Urology;  Laterality: N/A;   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      Patient Active Problem List   Diagnosis Date Noted   Iron deficiency anemia 06/20/2022   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   History of bladder cancer 05/19/2022   Goals of care, counseling/discussion 03/23/2022   Reactive thrombocytosis 03/21/2022   Paroxysmal atrial fibrillation with RVR ([place]) 03/20/2022   Acute bilateral deep vein thrombosis (DVT) of femoral veins (HCC) 03/20/2022   Acute respiratory failure with hypoxia (HCC) 03/16/2022   Acute on chronic diastolic CHF (congestive heart failure) ([place]) 03/16/2022   PSVT (paroxysmal supraventricular tachycardia) 03/16/2022   AKI (acute kidney injury) ([place]) 03/15/2022   Critical limb ischemia of left lower extremity with ulceration of lower leg ([place]) 03/15/2022   Arterial occlusion 03/11/2022   Elevated troponin    Unstable angina ([place]) 01/05/2021   Hyperlipidemia LDL goal <70    CKD (chronic kidney disease), stage III (HCC)    Coronary artery disease    NSTEMI (non-ST elevated myocardial infarction) ([place]) 12/23/2020   Acquired hypothyroidism 04/20/2020   Chemotherapy-induced neuropathy ([place]) 04/20/2020   Nonthrombocytopenic purpura ([place]) 12/03/2019   Hypercoagulable state ([place]) 12/10/2018   Recurrent deep vein thrombosis (DVT) (HCC)    Bilateral pulmonary  embolism ([place]) 12/07/2018   GERD (gastroesophageal reflux disease) 12/07/2018   HTN (hypertension) 12/07/2018   HLD (hyperlipidemia) 12/07/2018   Prostate cancer ([place]) 12/07/2018   Bladder cancer ([place]) 12/07/2018   DM type 2 with diabetic mixed hyperlipidemia ([place]) 04/04/2018   Pain in right hand 10/04/2017   Tubular adenoma 85/27/7824   Helicobacter pylori (H. pylori) infection 09/04/2017   Medicare annual wellness visit, initial 03/31/2017   History of prostate cancer 10/27/2014   Lower urinary tract symptoms (LUTS) 03/18/2012   Nephrolithiasis 03/18/2012    PCP: Rusty Aus, MD  REFERRING PROVIDER: Kris Hartmann, NP  REFERRING DIAG: Left below knee  amputee  THERAPY DIAG:  Left below-knee amputee [facility])  Gait difficulty  Muscle weakness (generalized)  Rationale for Evaluation and Treatment Rehabilitation  ONSET DATE: 03/16/22  SUBJECTIVE:   SUBJECTIVE STATEMENT:   EVALUATION Pt. S/p R BKA after blood clots in lower leg.  Pt. Reports minimum phantom limb symptoms and occasional muscle spasms.  Pt. Takes Baclofen to manage symptoms.  Pt. Received prosthetic leg on Wednesday from [facility].  Pt. Has h/o low back pain and has received spinal epidurals in past (2 years ago).    PERTINENT HISTORY: 1. Hx of BKA, left [facility]) Mr. Joakim Huesman was seen for further evaluation for left below knee prosthesis.  The is a 76 year old male who had amputation on 03/16/2022.  At the time he is well healed and ready for fitting of new below knee prosthesis.  He is a highly motivated individual and should do well once fitted with prosthesis.  She has no problem returning to a K2 ambulator, which will allow him to walk inside and outside of his home and overload level barriers.    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  He will be having a bone marrow biopsy.  We will continue to have the patient keep his IVC filter for now as there may be further test.  We will have the patient return in 3 months to discuss filter removal.    PAIN:  Are you having pain? No  PRECAUTIONS: None  WEIGHT BEARING RESTRICTIONS No  FALLS:  Has patient fallen in last 6 months? No  LIVING ENVIRONMENT: Lives with: lives with their spouse Lives in: House/apartment Stairs: Yes: Internal: 13+ steps; on right going up Has following equipment at home: Single point cane and Walker - 2 wheeled  OCCUPATION: Retired  PLOF: [place] with walking and return to playing golf by spring.     OBJECTIVE:   PATIENT SURVEYS:  FOTO initial 40/ goal 64.    COGNITION:  Overall cognitive status:  Within functional limits for tasks assessed     SENSATION: WFL  EDEMA:  Circumferential: L/R knee joint line (34/36 cm.), calf (30.5/ 33 cm), distal quad (35/36 cm).    POSTURE: rounded shoulders and forward head  PALPATION: No tenderness/ good incision healing on residual limb  LOWER EXTREMITY ROM:  B LE WFL (all planes).  L knee 0-130 deg.   LOWER EXTREMITY MMT:  R LE muscle strength grossly 5/5 MMT except hip flexion 4+/5 MMT.  L LE strength grossly 5/5 MMT except hip flexion 4/5 MMT and hip abduction 4+/5 MMT.    FUNCTIONAL TESTS:  5 times sit to stand: 14.1 sec. And requires R UE assist.  Able to stand from chair with addition of 2" Airex pad.    GAIT: Distance walked: in clinic/ //-bars  Assistive device utilized: Environmental consultant - 2 wheeled Level of assistance: Min A Comments: Ambulates in clinic with RW and min. A/ cuing for proper technique and step pattern.  Pt. Able to ambulate in //-bars with R UE assist only and 2-point gait pattern.      TODAY'S TREATMENT:  07/14/22:  Subjective: Pt. Entered PT with use of SPC and demonstrates ability to amb. With no SPC in gym.  Pt. Had f/u with Staci Righter and instructed in different donning of liner/ ply socks.  Pt. Reports residual limb is sore with increase walking.  Pt. States he did a little golf ball chipping.      There.ex.:  Nustep L4 10 min. B LE only (consistent cadence)  Resisted gait 2BTB 5x all 4-planes with light UE assist and mirror feedback.  Slight increase in L residual limb discomfort.    1/2 bolster standing in //-bars (wt. Shifting/ balance).  Light UE assist for safety.     Airex pad: step ups/ overs in //-bars with 1 UE assist and progressing to no UE assist. CGA for safety.     Gait training:  Amb. In //-bars with B UE progressing to no UE and focused on BOS/ recip. Gait pattern.  Forward/ lateral/ backwards walking.  5 laps.     Walking in clinic/ gym with no assistive device and working on recip.  Pattern/ heel strike.  Use of mirror for posture correction.    Ascending/ descending stairs with step to pattern/ progressing to recip. Stairs climbing in descending with UE assist on handrails.  Standing wt. Shifting and heel strike on steps with no UE assist.   Walking outside on varying terrain/ curbs/ grass with SPC.  Pt. Requires CGA for safety/ verbal cuing.  No LOB.       PATIENT EDUCATION:  Education details: Reviewed HEP Person educated: Patient Education method: Explanation, Demonstration, and Handouts Education comprehension: verbalized understanding and returned demonstration   HOME EXERCISE PROGRAM: See handouts.   ASSESSMENT:  CLINICAL IMPRESSION: Patient is highly motivated and hard working during standing/ walking tasks today.  Pt. Able to demonstrate a recip. Gait pattern with use of SPC but cuing to increase R LE step length/ heel strike.  Pt. Demonstrates ability to amb. On grass/ outside terrain with [facility] and CGA for safety.  Pt. Progressing to gait without UE assist and good balance. Pt. Will benefit from skilled PT services to increase LE muscle strength to improve independence with walking/ return to playing golf.     OBJECTIVE IMPAIRMENTS Abnormal gait, decreased activity tolerance, decreased balance, decreased endurance, decreased mobility, difficulty walking, decreased strength, impaired flexibility, improper body mechanics, postural dysfunction, and pain.   ACTIVITY LIMITATIONS carrying, lifting, bending, standing, squatting, stairs, transfers, toileting, dressing, and locomotion level  PARTICIPATION LIMITATIONS: driving, community activity, and yard work  PERSONAL FACTORS Fitness and Past/current experiences are also affecting patient's functional outcome.   REHAB POTENTIAL: Good  CLINICAL DECISION MAKING: Stable/uncomplicated  EVALUATION COMPLEXITY: Low   GOALS: Goals reviewed with patient? Yes  SHORT TERM GOALS: Target date: 07/22/22 Pt.  Independent with HEP to increase B hip strength 1/2 muscle grade to improve standing/walking mod. Independence.   Baseline:  see above Goal status: INITIAL    LONG TERM GOALS: Target date: 08/19/22  Pt. Will increase FOTO to 59 to improve functional mobility.   Baseline:  initial FOTO 40 Goal status: INITIAL  2.  Pt. Able to ascend/descend 13 stairs with R handrail and consistent step pattern to improve  pts. Ability to go to 2nd floor of home.   Baseline:  Goal status: INITIAL  3.  Pt. Will ambulate with mod. I and SPC with consistent 2-point gait pattern and no loss of balance to improve independence at home/ community.   Baseline:  Goal status: INITIAL  PLAN: PT FREQUENCY: 2x/week  PT DURATION: 8 weeks  PLANNED INTERVENTIONS: Therapeutic exercises, Therapeutic activity, Neuromuscular re-education, Balance training, Gait training, Patient/Family education, Self Care, Joint mobilization, Stair training, Prosthetic training, Manual therapy, and Re-evaluation  PLAN FOR NEXT SESSION: Progress gait with least assistive device.  Simulate golf swing  Pura Spice, PT, DPT # [phone] 07/14/2022, 5:42 PM

## 2022-07-18 ENCOUNTER — Encounter (INDEPENDENT_AMBULATORY_CARE_PROVIDER_SITE_OTHER): Payer: Self-pay

## 2022-07-18 ENCOUNTER — Encounter: Payer: Self-pay | Admitting: Dermatology

## 2022-07-20 ENCOUNTER — Inpatient Hospital Stay: Payer: Medicare HMO | Attending: Oncology

## 2022-07-20 ENCOUNTER — Encounter: Payer: Self-pay | Admitting: Oncology

## 2022-07-20 ENCOUNTER — Inpatient Hospital Stay (HOSPITAL_BASED_OUTPATIENT_CLINIC_OR_DEPARTMENT_OTHER): Payer: Medicare HMO | Admitting: Oncology

## 2022-07-20 DIAGNOSIS — Z1589 Genetic susceptibility to other disease: Secondary | ICD-10-CM

## 2022-07-20 DIAGNOSIS — D508 Other iron deficiency anemias: Secondary | ICD-10-CM

## 2022-07-20 DIAGNOSIS — C674 Malignant neoplasm of posterior wall of bladder: Secondary | ICD-10-CM

## 2022-07-20 DIAGNOSIS — Z8551 Personal history of malignant neoplasm of bladder: Secondary | ICD-10-CM | POA: Diagnosis not present

## 2022-07-20 DIAGNOSIS — Z7901 Long term (current) use of anticoagulants: Secondary | ICD-10-CM | POA: Diagnosis not present

## 2022-07-20 DIAGNOSIS — I82409 Acute embolism and thrombosis of unspecified deep veins of unspecified lower extremity: Secondary | ICD-10-CM | POA: Diagnosis not present

## 2022-07-20 DIAGNOSIS — D471 Chronic myeloproliferative disease: Secondary | ICD-10-CM

## 2022-07-20 DIAGNOSIS — Z86711 Personal history of pulmonary embolism: Secondary | ICD-10-CM | POA: Insufficient documentation

## 2022-07-20 DIAGNOSIS — Z86718 Personal history of other venous thrombosis and embolism: Secondary | ICD-10-CM | POA: Insufficient documentation

## 2022-07-20 LAB — IRON AND TIBC
Iron: 63 ug/dL (ref 45–182)
Saturation Ratios: 19 % (ref 17.9–39.5)
TIBC: 332 ug/dL (ref 250–450)
UIBC: 269 ug/dL

## 2022-07-20 LAB — COMPREHENSIVE METABOLIC PANEL
ALT: 19 U/L (ref 0–44)
AST: 20 U/L (ref 15–41)
Albumin: 4 g/dL (ref 3.5–5.0)
Alkaline Phosphatase: 55 U/L (ref 38–126)
Anion gap: 7 (ref 5–15)
BUN: 21 mg/dL (ref 8–23)
CO2: 25 mmol/L (ref 22–32)
Calcium: 9 mg/dL (ref 8.9–10.3)
Chloride: 108 mmol/L (ref 98–111)
Creatinine, Ser: 1.22 mg/dL (ref 0.61–1.24)
GFR, Estimated: >60 mL/min (ref >60)
Glucose, Bld: 126 mg/dL — ABNORMAL HIGH (ref 70–99)
Potassium: 4.1 mmol/L (ref 3.5–5.1)
Sodium: 140 mmol/L (ref 135–145)
Total Bilirubin: 0.6 mg/dL (ref 0.3–1.2)
Total Protein: 7.4 g/dL (ref 6.5–8.1)

## 2022-07-20 LAB — CBC WITH DIFFERENTIAL/PLATELET
Abs Immature Granulocytes: 0.03 10*3/uL (ref 0.00–0.07)
Basophils Absolute: 0.1 10*3/uL (ref 0.0–0.1)
Basophils Relative: 1 %
Eosinophils Absolute: 0.2 10*3/uL (ref 0.0–0.5)
Eosinophils Relative: 2 %
HCT: 46.8 % (ref 39.0–52.0)
Hemoglobin: 14.2 g/dL (ref 13.0–17.0)
Immature Granulocytes: 0 %
Lymphocytes Relative: 24 %
Lymphs Abs: 1.7 10*3/uL (ref 0.7–4.0)
MCH: 27.8 pg (ref 26.0–34.0)
MCHC: 30.3 g/dL (ref 30.0–36.0)
MCV: 91.6 fL (ref 80.0–100.0)
Monocytes Absolute: 0.5 10*3/uL (ref 0.1–1.0)
Monocytes Relative: 7 %
Neutro Abs: 4.6 10*3/uL (ref 1.7–7.7)
Neutrophils Relative %: 66 %
Platelets: 475 10*3/uL — ABNORMAL HIGH (ref 150–400)
RBC: 5.11 MIL/uL (ref 4.22–5.81)
RDW: 23.8 % — ABNORMAL HIGH (ref 11.5–15.5)
Smear Review: NORMAL
WBC: 7.1 10*3/uL (ref 4.0–10.5)
nRBC: 0 % (ref 0.0–0.2)

## 2022-07-20 LAB — RETIC PANEL
Immature Retic Fract: 22.6 % — ABNORMAL HIGH (ref 2.3–15.9)
RBC.: 5.14 MIL/uL (ref 4.22–5.81)
Retic Count, Absolute: 140.8 10*3/uL (ref 19.0–186.0)
Retic Ct Pct: 2.7 % (ref 0.4–3.1)
Reticulocyte Hemoglobin: 35.1 pg (ref >27.9)

## 2022-07-20 LAB — FERRITIN: Ferritin: 17 ng/mL — ABNORMAL LOW (ref 24–336)

## 2022-07-20 MED ORDER — HYDROXYUREA 500 MG PO CAPS: 500.0000 mg | ORAL_CAPSULE | Freq: Every day | ORAL | 1 refills | Status: DC

## 2022-07-20 NOTE — Assessment & Plan Note (Signed)
Non invasive bladder cancer s/p  tx'd with TURBT in 2020 and then BCG, follow up with urology

## 2022-07-20 NOTE — Assessment & Plan Note (Signed)
Iron panel has improved, still low.  Recommend patient to take Vitron C every other day.

## 2022-07-20 NOTE — Assessment & Plan Note (Addendum)
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 He tolerates hydroxyurea well.  Continue Hydroxyurea '500mg'$  daily. Refill was sent.

## 2022-07-20 NOTE — Assessment & Plan Note (Addendum)
Recurrent DVT and embolism, s/p IVC filter Continue long term anticoagulation with Eliquis '5mg'$  BID [ patient gets refills through PCP's office]. Also on Plavix.  I recommend bridging perioperatively if he needs to have any elective procedure.

## 2022-07-20 NOTE — Progress Notes (Addendum)
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 Also on Plavix.  I recommend bridging perioperatively if he needs to have any elective procedure.  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 He tolerates hydroxyurea well.  Continue Hydroxyurea 5037mdaily. Refill was sent.   Iron deficiency anemia Iron panel has improved, still low.  Recommend patient to take Vitron C every other day.   Orders Placed This Encounter  Procedures   CBC with Differential/Platelet    Standing Status:   Future    Standing Expiration Date:   07/21/2023   CBC with Differential/Platelet    Standing Status:   Future    Standing Expiration Date:   07/21/2023   Comprehensive metabolic panel    Standing Status:   Future    Standing Expiration Date:   07/20/2023   Iron and TIBC    Standing Status:   Future    Standing Expiration Date:   07/21/2023   Ferritin    Standing Status:   Future    Standing Expiration Date:   07/21/2023   Retic Panel    Standing Status:   Future    Standing Expiration Date:   07/21/2023   Follow up  6 weeks lab 3 months lab MD  All questions were answered. The patient knows to call the clinic with any problems, questions or concerns.  ZhEarlie ServerMD, PhD [facility] Hemat[facility] Oncology 07/20/2022    CHIEF COMPLAINTS/REASON FOR VISIT:  DVT/PE  Pertinent hematology oncology  history   Patient has significant history of previous DVT and PE.   Patient was seen by me in March 2020 and declined further follow-up. 2013 history of prostate cancer treated with cryo therapy  01/03/2016 history of superficial venous thrombosis demonstrated in the greater saphenous vein-.  Also history of superficial thrombophlebitis of segment  of left the basal leg vein in the upper arm-08/24/2017.  No anticoagulation was recommended at that time due to the SVT features. 11/25/2018, right lower extremity acute DVT, started on Lovenox for about a week, 12/05/2018, patient underwent TURBT and intravesical instillation of gemcitabine for noninvasive low-grade papillary urothelial carcinoma.  Lovenox was held for 2 days prior and for 24 hours after procedure, and also started on Eliquis 12/15/2018, developed acute nonocclusive segmental and subsegmental pulmonary emboli within the right upper lobe, right middle lobe, right lower lobe pulmonary branches.  Patient was admitted to the hospital and started on heparin drip.  Patient was seen by me during his admission at that time.  Lovenox 1 mg/kg twice daily was recommended at discharge.  He declined further follow-up with me in the clinic.   His anticoagulation was managed by primary care provider Dr. MiSabra Heck Patient was on Coumadin for anticoagulation until Feb 2023.  Patient did not check INR since September 2022.  Patient also developed supratherapeutic INR due to interaction between Coumadin and Diflucan for treatment of esophageal candidiasis.  Patient was  off Coumadin on 11/17/2021.  11/21/2021 developed acute nonocclusive DVT, started on Lovenox decision was made to switch to Eliquis.   03/09/2022, artificial urinary sphincter placement.  His Eliquis was held for 1 week prior to the procedure.  He has noticed left lower extremity pain 1 to 2 days after holding Eliquis. 03/11/2022, ER visit due to progressively worsening left lower extremity pain. CT angio  aortobifemoral with and without contrast showed eccentric thrombus in the left outflow vessels with distal occlusion of left renal vessels.  Delayed filling of the right popliteal artery is concerning for underlying occlusions and thromboembolic disease in the right runoff vessels Started on heparin drip in ED. 03/11/2022, vascular surgeon lower extremity bypass surgery 03/12/2022, severe left lower extremity pain, return to the OR for left lower extremity thrombectomy.  Continued on heparin drip. 03/16/2022, left lower extremity extremity nonsalvageable.  Status post BKA.  Per my discussion with pharmacy, heparin was held around 9 AM on 03/16/2022 and resumed on 03/17/2022 9:30 PM. . 03/17/2022, acute respiratory failure, CT chest angiogram showed bilateral central pulm emboli involving the main pulmonary arteries and extending into all lobar branches with nearly occlusive clot to the right lower lobe, left upper lobe, several segmental branches.  CT evidence of right heart strain.Mildly increased sclerotic focus    03/18/2022, thrombolysis, mechanical thrombectomy.  Heparin gtt restarted after procedure. 03/19/2022, heparin gtt was continued. 03/19/2022, bilateral lower extremity ultrasound showed thrombosis of right femoral popliteal and calf veins.  Partially thrombosed left profunda femoris vein. 03/19/2022 IVC filter placement. 03/20/2022, still have significant hypoxia.  On heparin gtt. 03/21/2022 -03/22/2022, on  heparin gtt. 03/23/2022, CT angio chest.showed increased clot burden within right pulmonary artery and the right lower lobe pulmonary emboli with similar clot burden in the right middle lobe, right upper lobe, left lower lobe.  Single residual fibrin strand/clot at the bifurcation of the pulmonary artery with decreased clot burden in the left upper lobe artery branches.  Persistent right heart strain.  Findings suspicious for developing pulmonary infarcts. 03/23/2022, mechanical embolectomy of pulmonary  arteries as well as right external iliac vein, common iliac vein and inferior vena cava.   Given that patient developed recurrent PE/DVT despite being therapeutic on heparin GTT, post thrombectomy, patient was switched to Angiomax and later started on Argatroban drip  Patient had  hypercoagulable work-up done during the admission. Work-up showed negative factor V Leiden mutation, prothrombin gene imaging.  Negative anticardiolipin antibodies, lupus anticoagulants.  Negative beta-2 glycoprotein antibodies Positive JAK2 V6 51F mutation Patient was discharged on Eliquis.     06/01/22 Bone marrow biopsy showed  BONE MARROW, ASPIRATE, CLOT, CORE:  -Hypercellular marrow (60%) involved by a JAK2 mutated myeloproliferative neoplasm (see comment) Comment: The overall findings are consistent with a myeloproliferative neoplasm. Diagnostic considerations include essential thrombocythemia, polycythemia vera and pre- fibrotic primary myelofibrosis. Importantly, there is no overt increase in blasts or fibrosis.  However, correlation with erythropoietin levels, iron studies, presence/absence of splenomegaly and serum LDH along with cytogenetic studies would be  helpful for a more definitive assessment.   PERIPHERAL BLOOD:   -Thrombocytosis  - Mild leukocytosis  - Normocytic normochromic anemia   Cytogenetics normal   INTERVAL HISTORY JERMAR COLTER is a 76 y.o. male who has above history reviewed by me today presents for follow up visit for  recurrent thrombosis, Jak2 V651F mutation myeloproliferative disease  Patient tolerates Hydroxyurea.  No new complaints. He has a new left lower extremity prosthetics.    MEDICAL HISTORY:  Past  Medical History:  Diagnosis Date   Arthritis    Basal cell carcinoma    L clavicle, L prox forearm- removed years ago    Bladder cancer (HCC)    Bladder neck contracture    CAD (coronary artery disease)    a. 12/2020 NSTEMI/Cath: LM nl, LAD 95ost/p, LCX 50p,  RCA nl. EF 45-50%.   Cataract    CKD (chronic kidney disease), stage III [facility])    ED (erectile dysfunction)    Frequency    GERD (gastroesophageal reflux disease)    History of pulmonary embolus (PE) 11/2018   a. Following LE DVT-->Chronic warfarin.   Hyperlipidemia LDL goal <70    Hypertension    Hypothyroidism    Incontinence of urine    sui, s/p cryoablation   Ischemic cardiomyopathy    a. 11/2018 Echo: EF 60-65%; b. 12/2020 LV Gram: EF 45-50% in setting of NSTEMI.   Kidney stones    Neuropathy    feet   Nocturia    Prostate cancer ([place])    S/P   CRYOABLATION   Right elbow tendinitis    Right Lower Extremity DVT (deep venous thrombosis) ([place]) 11/25/2018   Vertigo    1-2x/yr   Wears glasses     SURGICAL HISTORY: Past Surgical History:  Procedure Laterality Date   AMPUTATION Left 03/16/2022   Procedure: AMPUTATION BELOW KNEE;  Surgeon: Katha Cabal, MD;  Location: [facility]S;  Service: Vascular;  Laterality: Left;   ARTHROPLASTY AND TENDON REPAIR LEFT THUMB  JAN 2014   CATARACT EXTRACTION W/PHACO Left 11/16/2020   Procedure: CATARACT EXTRACTION PHACO AND INTRAOCULAR LENS PLACEMENT (IOC) LEFT  7.09 00:56.4 12.6%;  Surgeon: Leandrew Koyanagi, MD;  Location: [place];  Service: Ophthalmology;  Laterality: Left;   CATARACT EXTRACTION W/PHACO Right 12/02/2020   Procedure: CATARACT EXTRACTION PHACO AND INTRAOCULAR LENS PLACEMENT (IOC) RIGHT MALYUGIN 5.52 00:49.7 11.1%;  Surgeon: Leandrew Koyanagi, MD;  Location: [place];  Service: Ophthalmology;  Laterality: Right;   COLONOSCOPY     COLONOSCOPY WITH PROPOFOL N/A 08/09/2017   Procedure: COLONOSCOPY WITH PROPOFOL;  Surgeon: Manya Silvas, MD;  Location: [facility]COPY;  Service: Endoscopy;  Laterality: N/A;   CORONARY ANGIOGRAPHY N/A 12/28/2020   Procedure: CORONARY ANGIOGRAPHY;  Surgeon: Sherren Mocha, MD;  Location: [place] CV LAB;  Service: Cardiovascular;  Laterality: N/A;   CORONARY STENT  INTERVENTION N/A 12/28/2020   Procedure: CORONARY STENT INTERVENTION;  Surgeon: Sherren Mocha, MD;  Location: [place] CV LAB;  Service: Cardiovascular;  Laterality: N/A;   ESOPHAGOGASTRODUODENOSCOPY (EGD) WITH PROPOFOL N/A 08/09/2017   Procedure: ESOPHAGOGASTRODUODENOSCOPY (EGD) WITH PROPOFOL;  Surgeon: Manya Silvas, MD;  Location[facility];  Service: Endoscopy;  Laterality: N/A;   GREEN LIGHT LASER TURP (TRANSURETHRAL RESECTION OF PROSTATE N/A 12/14/2015   Procedure: GREEN LIGHT LASER TURP (TRANSURETHRAL RESECTION OF PROSTATE) LASER OF BLADDER NECK CONTRACTURE;  Surgeon: Carolan Clines, MD;  Location: [place];  Service: Urology;  Laterality: N/A;   IVC FILTER INSERTION N/A 03/19/2022   Procedure: IVC FILTER INSERTION;  Surgeon: Serafina Mitchell, MD;  Location: [place] CV LAB;  Service: Cardiovascular;  Laterality: N/A;   LEFT HEART CATH AND CORONARY ANGIOGRAPHY N/A 12/24/2020   Procedure: LEFT HEART CATH AND CORONARY ANGIOGRAPHY and possible PCI and stent;  Surgeon: Yolonda Kida, MD;  Location: [place] CV LAB;  Service: Cardiovascular;  Laterality: N/A;   LEFT HEART CATH AND CORONARY ANGIOGRAPHY N/A 01/06/2021   Procedure: LEFT HEART CATH AND CORONARY  ANGIOGRAPHY;  Surgeon: Troy Sine, MD;  Location: [place] CV LAB;  Service: Cardiovascular;  Laterality: N/A;   LOWER EXTREMITY ANGIOGRAPHY Left 03/11/2022   Procedure: Lower Extremity Angiography;  Surgeon: Katha Cabal, MD;  Location: [place] CV LAB;  Service: Cardiovascular;  Laterality: Left;   PENILE PROSTHESIS IMPLANT N/A 12/06/2013   Procedure: PENILE PROTHESIS INFLATABLE;  Surgeon: Ailene Rud, MD;  Location[facility];  Service: Urology;  Laterality: N/A;   PROSTATE CRYOABLATION  06-01-2012  [place]   PULMONARY THROMBECTOMY N/A 03/18/2022   Procedure: PULMONARY THROMBECTOMY;  Surgeon: Algernon Huxley, MD;  Location: [place] CV LAB;  Service:  Cardiovascular;  Laterality: N/A;   PULMONARY THROMBECTOMY Bilateral 03/23/2022   Procedure: PULMONARY THROMBECTOMY;  Surgeon: Katha Cabal, MD;  Location: [place] CV LAB;  Service: Cardiovascular;  Laterality: Bilateral;   THROMBECTOMY ILIAC ARTERY Left 03/12/2022   Procedure: THROMBECTOMY LEG;  Surgeon: Conrad Cimarron, MD;  Location: [facility] ORS;  Service: Vascular;  Laterality: Left;   TRANSURETHRAL RESECTION OF BLADDER NECK N/A 03/20/2015   Procedure: RELEASE BLADDER NECK CONTRACTURE  WITH WOLF BUTTON ELECTRODE ;  Surgeon: Carolan Clines, MD;  Location: [place];  Service: Urology;  Laterality: N/A;   TRANSURETHRAL RESECTION OF BLADDER TUMOR N/A 12/05/2018   Procedure: TRANSURETHRAL RESECTION OF BLADDER TUMOR (TURBT)/CYSTOSCOPY/  INSTILLATION OF Rodell Perna;  Surgeon: Ceasar Mons, MD;  Location: [place];  Service: Urology;  Laterality: N/A;   TRANSURETHRAL RESECTION OF BLADDER TUMOR N/A 01/15/2020   Procedure: TRANSURETHRAL RESECTION OF BLADDER TUMOR (TURBT)/ CYSTOSCOPY;  Surgeon: Ceasar Mons, MD;  Location: [facility];  Service: Urology;  Laterality: N/A;   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      SOCIAL HISTORY: Social History   Socioeconomic History   Marital status: Married    Spouse name: Haruki Arnold   Number of children: 3   Years of education: Not on file   Highest education level: Not on file  Occupational History   Not on file  Tobacco Use   Smoking status: Never   Smokeless tobacco: Never  Vaping Use   Vaping Use: Never used  Substance and Sexual Activity   Alcohol use: No   Drug use: No   Sexual activity: Not Currently    Birth control/protection: None  Other Topics Concern   Not on file  Social History Narrative   Lives locally with wife.  Exercises regularly - gym/golf.  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   3  sons: 1 is a Marine scientist.    Social Determinants of Health   Financial Resource Strain: Not on file  Food Insecurity: Not on file  Transportation Needs: Not on file  Physical Activity: Not on file  Stress: Not on file  Social Connections: Not on file  Intimate Partner Violence: Not on file    FAMILY HISTORY: Family History  Problem Relation Age of Onset   CAD Father 38    ALLERGIES:  is allergic to doxazosin.  MEDICATIONS:  Current Outpatient Medications  Medication Sig Dispense Refill   acetaminophen (TYLENOL) 500 MG tablet Take 500 mg by mouth every 6 (six) hours as needed.     apixaban (ELIQUIS) 5 MG TABS tablet Take 1 tablet (5 mg total) by mouth 2 (two) times daily. 60 tablet 0   ascorbic acid (VITAMIN C) 500 MG tablet Take 500 mg by mouth  daily.     baclofen (LIORESAL) 10 MG tablet Take 10 mg by mouth 2 (two) times daily.     cetirizine (ZYRTEC) 10 MG tablet Take 10 mg by mouth daily.     clopidogrel (PLAVIX) 75 MG tablet Take 1 tablet (75 mg total) by mouth daily with breakfast. 90 tablet 3   co-enzyme Q-10 30 MG capsule Take 30 mg by mouth daily.     Glucosamine-Chondroit-Vit C-Mn (GLUCOSAMINE CHONDR 1500 COMPLX PO) Take 1 tablet by mouth every evening.     hydrochlorothiazide (HYDRODIURIL) 25 MG tablet Take 25 mg by mouth daily as needed.     Iron-Vitamin C (VITRON-C) 65-125 MG TABS Take 1 tablet by mouth daily. 90 tablet 1   melatonin 5 MG TABS Take 1 tablet (5 mg total) by mouth at bedtime. 30 tablet 0   metoprolol tartrate (LOPRESSOR) 25 MG tablet Take 0.5 tablets (12.5 mg total) by mouth 2 (two) times daily. 60 tablet 0   Multiple Vitamin (MULTIVITAMIN) tablet Take 1 tablet by mouth daily.     pantoprazole (PROTONIX) 40 MG tablet Take 1 tablet (40 mg total) by mouth daily. 90 tablet 3   pravastatin (PRAVACHOL) 40 MG tablet Take 40 mg by mouth daily.     traMADol (ULTRAM) 50 MG tablet Take 50 mg by mouth every 6 (six) hours as needed for mild pain.     TRUE METRIX BLOOD  GLUCOSE TEST test strip SMARTSIG:Via Meter     Turmeric (QC TUMERIC COMPLEX PO) Take by mouth.     apixaban (ELIQUIS) 5 MG TABS tablet Take 2 tablets (10 mg total) by mouth 2 (two) times daily for 9 doses. Start in evening 03/28/2022 (had AM dose is hospital) (Patient not taking: Reported on 07/20/2022) 18 tablet 0   hydroxyurea (HYDREA) 500 MG capsule Take 1 capsule (500 mg total) by mouth daily. May take with food to minimize GI side effects. 90 capsule 1   levothyroxine (SYNTHROID) 50 MCG tablet Take 50 mcg by mouth.     nitroGLYCERIN (NITROSTAT) 0.4 MG SL tablet Place 1 tablet (0.4 mg total) under the tongue every 5 (five) minutes x 3 doses as needed for chest pain. (Patient not taking: Reported on 05/19/2022) 25 tablet 3   polyethylene glycol (MIRALAX / GLYCOLAX) 17 g packet Take 17 g by mouth 2 (two) times daily as needed for moderate constipation or mild constipation. (Patient not taking: Reported on 07/20/2022) 14 each 0   No current facility-administered medications for this visit.    Review of Systems  Constitutional:  Negative for appetite change, chills, fatigue, fever and unexpected weight change.  HENT:   Negative for hearing loss and voice change.   Eyes:  Negative for eye problems and icterus.  Respiratory:  Negative for chest tightness, cough and shortness of breath.   Cardiovascular:  Negative for chest pain and leg swelling.  Gastrointestinal:  Negative for abdominal distention and abdominal pain.  Endocrine: Negative for hot flashes.  Genitourinary:  Negative for difficulty urinating, dysuria and frequency.   Musculoskeletal:  Negative for arthralgias.       Left BKA  Skin:  Negative for itching and rash.  Neurological:  Negative for light-headedness and numbness.  Hematological:  Negative for adenopathy. Does not bruise/bleed easily.  Psychiatric/Behavioral:  Negative for confusion.    PHYSICAL EXAMINATION:  Vitals:   07/20/22 1010  BP: 124/78  Pulse: 62  Resp: 16   Temp: (!) 95 F (35 C)  SpO2: 99%   Filed  Weights   07/20/22 1010  Weight: 166 lb 4.8 oz (75.4 kg)    Physical Exam Constitutional:      General: He is not in acute distress.    Comments: He walks with cane.   HENT:     Head: Normocephalic and atraumatic.  Eyes:     General: No scleral icterus. Cardiovascular:     Rate and Rhythm: Normal rate.  Pulmonary:     Effort: Pulmonary effort is normal. No respiratory distress.     Breath sounds: No wheezing.  Abdominal:     General: Bowel sounds are normal. There is no distension.     Palpations: Abdomen is soft.  Musculoskeletal:        General: Normal range of motion.     Cervical back: Normal range of motion and neck supple.     Comments: Status post left BKA, + left lower limb prosthetics.   Skin:    General: Skin is warm and dry.     Findings: No erythema or rash.  Neurological:     Mental Status: He is alert and oriented to person, place, and time. Mental status is at baseline.     Cranial Nerves: No cranial nerve deficit.     Coordination: Coordination normal.  Psychiatric:        Mood and Affect: Mood normal.     LABORATORY DATA:  I have reviewed the data as listed    Latest Ref Rng & Units 07/20/2022    9:39 AM 07/04/2022    8:57 AM 06/20/2022   10:57 AM  CBC  WBC 4.0 - 10.5 K/uL 7.1  9.1  9.4   Hemoglobin 13.0 - 17.0 g/dL 14.2  13.0  11.9   Hematocrit 39.0 - 52.0 % 46.8  43.5  40.2   Platelets 150 - 400 K/uL 475  577  777       Latest Ref Rng & Units 07/20/2022    9:39 AM 07/04/2022    8:57 AM 06/20/2022   10:57 AM  CMP  Glucose 70 - 99 mg/dL 126  150  189   BUN 8 - 23 mg/dL _0 Creatinine 0.61 - 1.24 mg/dL 1.22  1.31  1.15   Sodium 135 - 145 mmol/L 140  142  140   Potassium 3.5 - 5.1 mmol/L 4.1  4.4  4.0   Chloride 98 - 111 mmol/L 108  111  112   CO2 22 - 32 mmol/L _1 Calcium 8.9 - 10.3 mg/dL 9.0  8.9  8.5   Total Protein 6.5 - 8.1 g/dL 7.4  7.2  6.9   Total Bilirubin 0.3 - 1.2  mg/dL 0.6  0.6  0.5   Alkaline Phos 38 - 126 U/L 55  50  57   AST 15 - 41 U/L _2 ALT 0 - 44 U/L _3 RADIOGRAPHIC STUDIES: I have personally reviewed the radiological images as listed and agreed with the findings in the report. CT BONE MARROW BIOPSY & ASPIRATION  Result Date: 06/01/2022 INDICATION: JAK2 mutation. EXAM: CT GUIDED BONE MARROW ASPIRATION AND CORE BIOPSY MEDICATIONS: None. ANESTHESIA/SEDATION: Moderate (conscious) sedation was employed during this procedure. A total of Versed 1 mg and Fentanyl 50 mcg was administered intravenously. Moderate Sedation Time: 20 minutes. The patient's level of consciousness and vital signs were monitored continuously by radiology nursing throughout the  procedure under my direct supervision. FLUOROSCOPY TIME:  CT dose in mGy was not provided. COMPLICATIONS: None immediate. Estimated blood loss: <5 mL PROCEDURE: RADIATION DOSE REDUCTION: This exam was performed according to the departmental dose-optimization program which includes automated exposure control, adjustment of the mA and/or kV according to patient size and/or use of iterative reconstruction technique. Informed written consent was obtained from the patient after a thorough discussion of the procedural risks, benefits and alternatives. All questions were addressed. Maximal Sterile Barrier Technique was utilized including caps, mask, sterile gowns, sterile gloves, sterile drape, hand hygiene and skin antiseptic. A timeout was performed prior to the initiation of the procedure. The patient was positioned prone and non-contrast localization CT was performed of the pelvis to demonstrate the iliac marrow spaces. Maximal barrier sterile technique utilized including caps, mask, sterile gowns, sterile gloves, large sterile drape, hand hygiene, and chlorhexidine prep. Under sterile conditions and local anesthesia, an 11 gauge coaxial bone biopsy needle was advanced into the RIGHT iliac  marrow space. Needle position was confirmed with CT imaging. Initially, bone marrow aspiration was performed. Next, the 11 gauge outer cannula was utilized to obtain a 2 iliac bone marrow core biopsy. Needle was removed. Hemostasis was obtained with compression. The patient tolerated the procedure well. Samples were prepared with the cytotechnologist. IMPRESSION: Successful CT-guided bone marrow aspiration and biopsy, as above. Michaelle Birks, MD [facility] Radiology Electronically Signed   By: Michaelle Birks M.D.   On: 06/01/2022 10:51   VAS [place] ABI WITH/WO TBI  Result Date: 05/27/2022  LOWER EXTREMITY DOPPLER STUDY Patient Name:  HEIDI LEMAY  Date of Exam:   05/20/2022 Medical Rec #: [id number]         Accession #:    [id number] Date of Birth: 01-21-1946         Patient Gender: M Patient Age:   76 years Exam Location:  [facility] Procedure:      VAS [place] ABI WITH/WO TBI Referring Phys: GREGORY SCHNIER --------------------------------------------------------------------------------  High Risk Factors: Hypertension, hyperlipidemia, Diabetes, no history of                    smoking, prior MI.  Vascular Interventions: Left below knee amputation 03/16/2022. Performing Technologist: Delorise Shiner RVT  Examination Guidelines: A complete evaluation includes at minimum, Doppler waveform signals and systolic blood pressure reading at the level of bilateral brachial, anterior tibial, and posterior tibial arteries, when vessel segments are accessible. Bilateral testing is considered an integral part of a complete examination. Photoelectric Plethysmograph (PPG) waveforms and toe systolic pressure readings are included as required and additional duplex testing as needed. Limited examinations for reoccurring indications may be performed as noted.  ABI Findings: +---------+------------------+-----+---------+--------+ Right    Rt Pressure (mmHg)IndexWaveform  Comment  +---------+------------------+-----+---------+--------+ Brachial 157                                      +---------+------------------+-----+---------+--------+ ATA      174               1.09 triphasic         +---------+------------------+-----+---------+--------+ PTA      204               1.28 triphasic         +---------+------------------+-----+---------+--------+ Meyer Cory  0.87                   +---------+------------------+-----+---------+--------+ +--------+------------------+-----+--------+-------+ Left    Lt Pressure (mmHg)IndexWaveformComment +--------+------------------+-----+--------+-------+ [id number]                                    +--------+------------------+-----+--------+-------+ +-------+-----------+-----------+------------+------------+ ABI/TBIToday's ABIToday's TBIPrevious ABIPrevious TBI +-------+-----------+-----------+------------+------------+ Right  1.28       0.87                                +-------+-----------+-----------+------------+------------+  Summary: Right: Resting right ankle-brachial index is within normal range. The right toe-brachial index is normal. *See table(s) above for measurements and observations.  Electronically signed by Leotis Pain MD on 05/27/2022 at 8:52:00 AM.    Final    CT PELVIS W CONTRAST  Result Date: 05/15/2022 CLINICAL DATA:  Malfunctioning penile prosthesis. EXAM: CT PELVIS WITH CONTRAST TECHNIQUE: Multidetector CT imaging of the pelvis was performed using the standard protocol following the bolus administration of intravenous contrast. RADIATION DOSE REDUCTION: This exam was performed according to the departmental dose-optimization program which includes automated exposure control, adjustment of the mA and/or kV according to patient size and/or use of iterative reconstruction technique. CONTRAST:  180m OMNIPAQUE IOHEXOL 300 MG/ML  SOLN COMPARISON:  Feb 13, 2022.  FINDINGS: Urinary Tract:  No abnormality visualized. Bowel:  Unremarkable visualized pelvic bowel loops. Vascular/Lymphatic: Atherosclerosis of visualized abdominal aorta is noted. No definite adenopathy is noted. Reproductive: 2 penile prosthesis reservoirs are noted anteriorly in the pelvis and appear grossly intact. Patient has reportedly undergone transurethral resection of bladder tumor. Other:  No hernia or ascites is noted. Musculoskeletal: No suspicious bone lesions identified. IMPRESSION: Two penile prosthesis reservoirs are noted anteriorly in the pelvis and appear grossly intact. No acute abnormality seen in the abdomen or pelvis. Electronically Signed   By: JMarijo ConceptionM.D.   On: 05/15/2022 10:10

## 2022-07-21 DIAGNOSIS — D500 Iron deficiency anemia secondary to blood loss (chronic): Secondary | ICD-10-CM | POA: Diagnosis not present

## 2022-07-21 DIAGNOSIS — E1169 Type 2 diabetes mellitus with other specified complication: Secondary | ICD-10-CM | POA: Diagnosis not present

## 2022-07-21 DIAGNOSIS — E782 Mixed hyperlipidemia: Secondary | ICD-10-CM | POA: Diagnosis not present

## 2022-07-21 DIAGNOSIS — R1030 Lower abdominal pain, unspecified: Secondary | ICD-10-CM | POA: Diagnosis not present

## 2022-07-21 DIAGNOSIS — Z89512 Acquired absence of left leg below knee: Secondary | ICD-10-CM | POA: Diagnosis not present

## 2022-07-21 NOTE — Telephone Encounter (Signed)
-----   Message from Earlie Server, MD sent at 07/20/2022  8:41 PM EDT ----- He prefers a call after iron panel results.  Please let him know that his iron level has improved, still low. Recommend him to take iron supplementation every other day

## 2022-07-21 NOTE — Telephone Encounter (Signed)
Pt sent mychart message asking about iron levels so replied to pt via mychart.

## 2022-07-25 ENCOUNTER — Ambulatory Visit: Payer: Medicare HMO | Attending: Nurse Practitioner | Admitting: Physical Therapy

## 2022-07-25 DIAGNOSIS — M6281 Muscle weakness (generalized): Secondary | ICD-10-CM | POA: Diagnosis not present

## 2022-07-25 DIAGNOSIS — R269 Unspecified abnormalities of gait and mobility: Secondary | ICD-10-CM | POA: Insufficient documentation

## 2022-07-25 DIAGNOSIS — Z89512 Acquired absence of left leg below knee: Secondary | ICD-10-CM | POA: Diagnosis not present

## 2022-07-25 NOTE — Therapy (Signed)
OUTPATIENT PHYSICAL THERAPY LOWER EXTREMITY TREATMENT   Patient Name: Phillip [name] MRN: [id number] DOB:05-14-1946, 76 y.o., male Today's Date: 07/25/2022   PT End of Session - 07/25/22 0754     Visit Number 8    Number of Visits 9    Date for PT Re-Evaluation 08/19/22    PT Start Time 0811    PT Stop Time 0902    PT Time Calculation (min) 51 min    Activity Tolerance Patient tolerated treatment well             Past Medical History:  Diagnosis Date   Arthritis    Basal cell carcinoma    L clavicle, L prox forearm- removed years ago    Bladder cancer [facility])    Bladder neck contracture    CAD (coronary artery disease)    a. 12/2020 NSTEMI/Cath: LM nl, LAD 95ost/p, LCX 50p, RCA nl. EF 45-50%.   Cataract    CKD (chronic kidney disease), stage III [facility])    ED (erectile dysfunction)    Frequency    GERD (gastroesophageal reflux disease)    History of pulmonary embolus (PE) 11/2018   a. Following LE DVT-->Chronic warfarin.   Hyperlipidemia LDL goal <70    Hypertension    Hypothyroidism    Incontinence of urine    sui, s/p cryoablation   Ischemic cardiomyopathy    a. 11/2018 Echo: EF 60-65%; b. 12/2020 LV Gram: EF 45-50% in setting of NSTEMI.   Kidney stones    Neuropathy    feet   Nocturia    Prostate cancer ([place])    S/P   CRYOABLATION   Right elbow tendinitis    Right Lower Extremity DVT (deep venous thrombosis) ([place]) 11/25/2018   Vertigo    1-2x/yr   Wears glasses    Past Surgical History:  Procedure Laterality Date   AMPUTATION Left 03/16/2022   Procedure: AMPUTATION BELOW KNEE;  Surgeon: Katha Cabal, MD;  Location: [facility] ORS;  Service: Vascular;  Laterality: Left;   ARTHROPLASTY AND TENDON REPAIR LEFT THUMB  JAN 2014   CATARACT EXTRACTION W/PHACO Left 11/16/2020   Procedure: CATARACT EXTRACTION PHACO AND INTRAOCULAR LENS PLACEMENT (IOC) LEFT  7.09 00:56.4 12.6%;  Surgeon: Leandrew Koyanagi, MD;  Location: [place];  Service:  Ophthalmology;  Laterality: Left;   CATARACT EXTRACTION W/PHACO Right 12/02/2020   Procedure: CATARACT EXTRACTION PHACO AND INTRAOCULAR LENS PLACEMENT (IOC) RIGHT MALYUGIN 5.52 00:49.7 11.1%;  Surgeon: Leandrew Koyanagi, MD;  Location: [place];  Service: Ophthalmology;  Laterality: Right;   COLONOSCOPY     COLONOSCOPY WITH PROPOFOL N/A 08/09/2017   Procedure: COLONOSCOPY WITH PROPOFOL;  Surgeon: Manya Silvas, MD;  Location: [facility] ENDOSCOPY;  Service: Endoscopy;  Laterality: N/A;   CORONARY ANGIOGRAPHY N/A 12/28/2020   Procedure: CORONARY ANGIOGRAPHY;  Surgeon: Sherren Mocha, MD;  Location: [place] CV LAB;  Service: Cardiovascular;  Laterality: N/A;   CORONARY STENT INTERVENTION N/A 12/28/2020   Procedure: CORONARY STENT INTERVENTION;  Surgeon: Sherren Mocha, MD;  Location: [place] [facility] LAB;  Service: Cardiovascular;  Laterality: N/A;   ESOPHAGOGASTRODUODENOSCOPY (EGD) WITH PROPOFOL N/A 08/09/2017   Procedure: ESOPHAGOGASTRODUODENOSCOPY (EGD) WITH PROPOFOL;  Surgeon: Manya Silvas, MD;  Location: [facility]COPY;  Service: Endoscopy;  Laterality: N/A;   GREEN LIGHT LASER TURP (TRANSURETHRAL RESECTION OF PROSTATE N/A 12/14/2015   Procedure: GREEN LIGHT LASER TURP (TRANSURETHRAL RESECTION OF PROSTATE) LASER OF BLADDER NECK CONTRACTURE;  Surgeon: Carolan Clines, MD;  Location: [place];  Service: Urology;  Laterality: N/A;   IVC FILTER INSERTION N/A 03/19/2022   Procedure: IVC FILTER INSERTION;  Surgeon: Serafina Mitchell, MD;  Location: [place] [facility] LAB;  Service: Cardiovascular;  Laterality: N/A;   LEFT HEART CATH AND CORONARY ANGIOGRAPHY N/A 12/24/2020   Procedure: LEFT HEART CATH AND CORONARY ANGIOGRAPHY and possible PCI and stent;  Surgeon: Yolonda Kida, MD;  Location: [place] CV LAB;  Service: Cardiovascular;  Laterality: N/A;   LEFT HEART CATH AND CORONARY ANGIOGRAPHY N/A 01/06/2021   Procedure: LEFT HEART CATH AND CORONARY  ANGIOGRAPHY;  Surgeon: Troy Sine, MD;  Location: [place] CV LAB;  Service: Cardiovascular;  Laterality: N/A;   LOWER EXTREMITY ANGIOGRAPHY Left 03/11/2022   Procedure: Lower Extremity Angiography;  Surgeon: Katha Cabal, MD;  Location: [place] CV LAB;  Service: Cardiovascular;  Laterality: Left;   PENILE PROSTHESIS IMPLANT N/A 12/06/2013   Procedure: PENILE PROTHESIS INFLATABLE;  Surgeon: Ailene Rud, MD;  Location: [facility];  Service: Urology;  Laterality: N/A;   PROSTATE CRYOABLATION  06-01-2012  [facility]   PULMONARY THROMBECTOMY N/A 03/18/2022   Procedure: PULMONARY THROMBECTOMY;  Surgeon: Algernon Huxley, MD;  Location: [place] CV LAB;  Service: Cardiovascular;  Laterality: N/A;   PULMONARY THROMBECTOMY Bilateral 03/23/2022   Procedure: PULMONARY THROMBECTOMY;  Surgeon: Katha Cabal, MD;  Location: [place] CV LAB;  Service: Cardiovascular;  Laterality: Bilateral;   THROMBECTOMY ILIAC ARTERY Left 03/12/2022   Procedure: THROMBECTOMY LEG;  Surgeon: Conrad Three Way, MD;  Location: [facility]S;  Service: Vascular;  Laterality: Left;   TRANSURETHRAL RESECTION OF BLADDER NECK N/A 03/20/2015   Procedure: RELEASE BLADDER NECK CONTRACTURE  WITH WOLF BUTTON ELECTRODE ;  Surgeon: Carolan Clines, MD;  Location: [place];  Service: Urology;  Laterality: N/A;   TRANSURETHRAL RESECTION OF BLADDER TUMOR N/A 12/05/2018   Procedure: TRANSURETHRAL RESECTION OF BLADDER TUMOR (TURBT)/CYSTOSCOPY/  INSTILLATION OF Rodell Perna;  Surgeon: Ceasar Mons, MD;  Location: [facility];  Service: Urology;  Laterality: N/A;   TRANSURETHRAL RESECTION OF BLADDER TUMOR N/A 01/15/2020   Procedure: TRANSURETHRAL RESECTION OF BLADDER TUMOR (TURBT)/ CYSTOSCOPY;  Surgeon: Ceasar Mons, MD;  Location: [facility];  Service: Urology;  Laterality: N/A;   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      Patient Active Problem List   Diagnosis Date Noted   Iron deficiency anemia 06/20/2022   Myeloproliferative disorder ([place]) 05/19/2022   History of bladder cancer 05/19/2022   Goals of care, counseling/discussion 03/23/2022   Reactive thrombocytosis 03/21/2022   Paroxysmal atrial fibrillation with RVR ([place]) 03/20/2022   Acute bilateral deep vein thrombosis (DVT) of femoral veins (HCC) 03/20/2022   Acute respiratory failure with hypoxia (HCC) 03/16/2022   Acute on chronic diastolic CHF (congestive heart failure) ([place]) 03/16/2022   PSVT (paroxysmal supraventricular tachycardia) 03/16/2022   AKI (acute kidney injury) ([place]) 03/15/2022   Critical limb ischemia of left lower extremity with ulceration of lower leg ([place]) 03/15/2022   Arterial occlusion 03/11/2022   Elevated troponin    Unstable angina ([place]) 01/05/2021   Hyperlipidemia LDL goal <70    CKD (chronic kidney disease), stage III (HCC)    Coronary artery disease    NSTEMI (non-ST elevated myocardial infarction) ([place]) 12/23/2020   Acquired hypothyroidism 04/20/2020   Chemotherapy-induced neuropathy ([place]) 04/20/2020   Nonthrombocytopenic purpura ([place]) 12/03/2019   Hypercoagulable state ([place]) 12/10/2018   Recurrent deep vein thrombosis (DVT) (HCC)    Bilateral pulmonary  embolism ([place]) 12/07/2018   GERD (gastroesophageal reflux disease) 12/07/2018   HTN (hypertension) 12/07/2018   HLD (hyperlipidemia) 12/07/2018   Prostate cancer ([place]) 12/07/2018   Bladder cancer ([place]) 12/07/2018   DM type 2 with diabetic mixed hyperlipidemia ([place]) 04/04/2018   Pain in right hand 10/04/2017   Tubular adenoma 65/53/7482   Helicobacter pylori (H. pylori) infection 09/04/2017   Medicare annual wellness visit, initial 03/31/2017   History of prostate cancer 10/27/2014   Lower urinary tract symptoms (LUTS) 03/18/2012   Nephrolithiasis 03/18/2012    PCP: Rusty Aus, MD  REFERRING PROVIDER: Kris Hartmann, NP  REFERRING DIAG: Left below  knee amputee  THERAPY DIAG:  Left below-knee amputee [facility])  Gait difficulty  Muscle weakness (generalized)  Rationale for Evaluation and Treatment Rehabilitation  ONSET DATE: 03/16/22  SUBJECTIVE:   SUBJECTIVE STATEMENT:   EVALUATION Pt. S/p R BKA after blood clots in lower leg.  Pt. Reports minimum phantom limb symptoms and occasional muscle spasms.  Pt. Takes Baclofen to manage symptoms.  Pt. Received prosthetic leg on Wednesday from [facility].  Pt. Has h/o low back pain and has received spinal epidurals in past (2 years ago).    PERTINENT HISTORY: 1. Hx of BKA, left [facility]) Mr. Josiah Nieto was seen for further evaluation for left below knee prosthesis.  The is a 76 year old male who had amputation on 03/16/2022.  At the time he is well healed and ready for fitting of new below knee prosthesis.  He is a highly motivated individual and should do well once fitted with prosthesis.  She has no problem returning to a K2 ambulator, which will allow him to walk inside and outside of his home and overload level barriers.    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  He will be having a bone marrow biopsy.  We will continue to have the patient keep his IVC filter for now as there may be further test.  We will have the patient return in 3 months to discuss filter removal.    PAIN:  Are you having pain? No  PRECAUTIONS: None  WEIGHT BEARING RESTRICTIONS No  FALLS:  Has patient fallen in last 6 months? No  LIVING ENVIRONMENT: Lives with: lives with their spouse Lives in: House/apartment Stairs: Yes: Internal: 13+ steps; on right going up Has following equipment at home: Single point cane and Walker - 2 wheeled  OCCUPATION: Retired  PLOF: [place] with walking and return to playing golf by spring.     OBJECTIVE:   PATIENT SURVEYS:  FOTO initial 40/ goal 15.    COGNITION:  Overall cognitive  status: Within functional limits for tasks assessed     SENSATION: WFL  EDEMA:  Circumferential: L/R knee joint line (34/36 cm.), calf (30.5/ 33 cm), distal quad (35/36 cm).    POSTURE: rounded shoulders and forward head  PALPATION: No tenderness/ good incision healing on residual limb  LOWER EXTREMITY ROM:  B LE WFL (all planes).  L knee 0-130 deg.   LOWER EXTREMITY MMT:  R LE muscle strength grossly 5/5 MMT except hip flexion 4+/5 MMT.  L LE strength grossly 5/5 MMT except hip flexion 4/5 MMT and hip abduction 4+/5 MMT.    FUNCTIONAL TESTS:  5 times sit to stand: 14.1 sec. And requires R UE assist.  Able to stand from chair with addition of 2" Airex pad.    GAIT: Distance walked: in clinic/ //-bars  Assistive device utilized: Environmental consultant - 2 wheeled Level of assistance: Min A Comments: Ambulates in clinic with RW and min. A/ cuing for proper technique and step pattern.  Pt. Able to ambulate in //-bars with R UE assist only and 2-point gait pattern.      TODAY'S TREATMENT:  07/25/22:  Subjective: Pt. Entered PT with no SPC (reports he forgot SPC at home this morning) and demonstrates a more normalized/ consistent step pattern.  Pt. Reports doing well today but had some GI issues last week that have improved.    There.ex.:  Nustep L5 10 min. B LE only (consistent cadence).  Discussed weekend activities.    Walking high marching/ backwards walking with focus on hip extension/ toe strike (4 laps)- light to no UE assist.   Resisted gait 2BTB 5x all 4-planes with light UE assist and mirror feedback.  Slight increase in L residual limb discomfort.     Neuro.mm.:  Simulated golf swing in gym with SBA/CGA for safety.  Pt. Able to shift weight on/off L LE and maintain balance while chipping golf balls.  Pt. Attempted full swing with caution but good balance.  Pt. Is planning to hit some golf balls at driving range.     Gait training:  Walking in clinic/ gym with no assistive  device and working on recip. Pattern/ heel strike.  Walking in hallway with increase cadence.    Ascending/ descending stairs with step to pattern/ progressing to recip. Stairs climbing in descending with 1 UE assist on handrails.      PATIENT EDUCATION:  Education details: Reviewed HEP Person educated: Patient Education method: Explanation, Demonstration, and Handouts Education comprehension: verbalized understanding and returned demonstration   HOME EXERCISE PROGRAM: See handouts.   ASSESSMENT:  CLINICAL IMPRESSION: Patient is highly motivated and hard working during standing/ walking tasks today.  Pt. Able to demonstrate a recip. Gait pattern without use of SPC safely.  No LOB during tx. And pt. Demonstrates good wt. Shifting during golf simulation.   Pt. Progressing to gait without UE assist and good balance. Pt. Will benefit from skilled PT services to increase LE muscle strength to improve independence with walking/ return to playing golf.     OBJECTIVE IMPAIRMENTS Abnormal gait, decreased activity tolerance, decreased balance, decreased endurance, decreased mobility, difficulty walking, decreased strength, impaired flexibility, improper body mechanics, postural dysfunction, and pain.   ACTIVITY LIMITATIONS carrying, lifting, bending, standing, squatting, stairs, transfers, toileting, dressing, and locomotion level  PARTICIPATION LIMITATIONS: driving, community activity, and yard work  PERSONAL FACTORS Fitness and Past/current experiences are also affecting patient's functional outcome.   REHAB POTENTIAL: Good  CLINICAL DECISION MAKING: Stable/uncomplicated  EVALUATION COMPLEXITY: Low   GOALS: Goals reviewed with patient? Yes  SHORT TERM GOALS: Target date: 07/22/22 Pt. Independent with HEP to increase B hip strength 1/2 muscle grade to improve standing/walking mod. Independence.   Baseline:  see above.  L hip flexion 4/5 MMT.  Goal status: Partially met    LONG  TERM GOALS: Target date: 08/19/22  Pt. Will increase FOTO to 59 to improve functional mobility.   Baseline:  initial FOTO 40 Goal status: INITIAL  2.  Pt. Able to ascend/descend 13 stairs with R handrail and consistent step pattern to improve pts. Ability to go to 2nd floor of home.   Baseline:  Goal status: INITIAL  3.  Pt. Will ambulate with mod. I and SPC with consistent 2-point gait pattern and no loss of balance to improve independence at home/ community.  Baseline:  Goal status: INITIAL  PLAN: PT FREQUENCY: 2x/week  PT DURATION: 8 weeks  PLANNED INTERVENTIONS: Therapeutic exercises, Therapeutic activity, Neuromuscular re-education, Balance training, Gait training, Patient/Family education, Self Care, Joint mobilization, Stair training, Prosthetic training, Manual therapy, and Re-evaluation  PLAN FOR NEXT SESSION: Progress gait with least assistive device.  Simulate golf swing  Pura Spice, PT, DPT # [phone] 07/25/2022, 12:42 PM

## 2022-07-26 ENCOUNTER — Telehealth: Payer: Self-pay

## 2022-07-26 NOTE — Telephone Encounter (Signed)
-----   Message from Earlie Server, MD sent at 07/20/2022  8:41 PM EDT ----- He prefers a call after iron panel results.  Please let him know that his iron level has improved, still low. Recommend him to take iron supplementation every other day

## 2022-07-26 NOTE — Telephone Encounter (Signed)
Called patient and informed him that his iron has improved but is still low and that the should continue to take his iron supplementation every other day. Patient thanked me for calling and stated that he would do that.

## 2022-07-27 ENCOUNTER — Encounter: Payer: Self-pay | Admitting: Physical Therapy

## 2022-07-27 ENCOUNTER — Ambulatory Visit: Payer: Medicare HMO | Admitting: Physical Therapy

## 2022-07-27 DIAGNOSIS — M6281 Muscle weakness (generalized): Secondary | ICD-10-CM | POA: Diagnosis not present

## 2022-07-27 DIAGNOSIS — Z89512 Acquired absence of left leg below knee: Secondary | ICD-10-CM | POA: Diagnosis not present

## 2022-07-27 DIAGNOSIS — R269 Unspecified abnormalities of gait and mobility: Secondary | ICD-10-CM | POA: Diagnosis not present

## 2022-07-27 NOTE — Therapy (Signed)
OUTPATIENT PHYSICAL THERAPY LOWER EXTREMITY TREATMENT/ RECERTIFICATION   Patient Name: Phillip [name] MRN: [id number] DOB:1946/08/22, 75 y.o., male Today's Date: 07/27/2022   PT End of Session - 07/27/22 0754     Visit Number 9    Number of Visits 17    Date for PT Re-Evaluation 08/26/22    PT Start Time 0811    PT Stop Time 0858    PT Time Calculation (min) 47 min    Activity Tolerance Patient tolerated treatment well             Past Medical History:  Diagnosis Date   Arthritis    Basal cell carcinoma    L clavicle, L prox forearm- removed years ago    Bladder cancer [facility])    Bladder neck contracture    CAD (coronary artery disease)    a. 12/2020 NSTEMI/Cath: LM nl, LAD 95ost/p, LCX 50p, RCA nl. EF 45-50%.   Cataract    CKD (chronic kidney disease), stage III [facility])    ED (erectile dysfunction)    Frequency    GERD (gastroesophageal reflux disease)    History of pulmonary embolus (PE) 11/2018   a. Following LE DVT-->Chronic warfarin.   Hyperlipidemia LDL goal <70    Hypertension    Hypothyroidism    Incontinence of urine    sui, s/p cryoablation   Ischemic cardiomyopathy    a. 11/2018 Echo: EF 60-65%; b. 12/2020 LV Gram: EF 45-50% in setting of NSTEMI.   Kidney stones    Neuropathy    feet   Nocturia    Prostate cancer ([place])    S/P   CRYOABLATION   Right elbow tendinitis    Right Lower Extremity DVT (deep venous thrombosis) ([place]) 11/25/2018   Vertigo    1-2x/yr   Wears glasses    Past Surgical History:  Procedure Laterality Date   AMPUTATION Left 03/16/2022   Procedure: AMPUTATION BELOW KNEE;  Surgeon: Katha Cabal, MD;  Location: [facility] ORS;  Service: Vascular;  Laterality: Left;   ARTHROPLASTY AND TENDON REPAIR LEFT THUMB  JAN 2014   CATARACT EXTRACTION W/PHACO Left 11/16/2020   Procedure: CATARACT EXTRACTION PHACO AND INTRAOCULAR LENS PLACEMENT (IOC) LEFT  7.09 00:56.4 12.6%;  Surgeon: Leandrew Koyanagi, MD;  Location: [place];   Service: Ophthalmology;  Laterality: Left;   CATARACT EXTRACTION W/PHACO Right 12/02/2020   Procedure: CATARACT EXTRACTION PHACO AND INTRAOCULAR LENS PLACEMENT (IOC) RIGHT MALYUGIN 5.52 00:49.7 11.1%;  Surgeon: Leandrew Koyanagi, MD;  Location: [place];  Service: Ophthalmology;  Laterality: Right;   COLONOSCOPY     COLONOSCOPY WITH PROPOFOL N/A 08/09/2017   Procedure: COLONOSCOPY WITH PROPOFOL;  Surgeon: Manya Silvas, MD;  Location: [facility] ENDOSCOPY;  Service: Endoscopy;  Laterality: N/A;   CORONARY ANGIOGRAPHY N/A 12/28/2020   Procedure: CORONARY ANGIOGRAPHY;  Surgeon: Sherren Mocha, MD;  Location: [place] CV LAB;  Service: Cardiovascular;  Laterality: N/A;   CORONARY STENT INTERVENTION N/A 12/28/2020   Procedure: CORONARY STENT INTERVENTION;  Surgeon: Sherren Mocha, MD;  Location: [place] CV LAB;  Service: Cardiovascular;  Laterality: N/A;   ESOPHAGOGASTRODUODENOSCOPY (EGD) WITH PROPOFOL N/A 08/09/2017   Procedure: ESOPHAGOGASTRODUODENOSCOPY (EGD) WITH PROPOFOL;  Surgeon: Manya Silvas, MD;  Location: [facility] ENDOSCOPY;  Service: Endoscopy;  Laterality: N/A;   GREEN LIGHT LASER TURP (TRANSURETHRAL RESECTION OF PROSTATE N/A 12/14/2015   Procedure: GREEN LIGHT LASER TURP (TRANSURETHRAL RESECTION OF PROSTATE) LASER OF BLADDER NECK CONTRACTURE;  Surgeon: Carolan Clines, MD;  Location: [place];  Service:  Urology;  Laterality: N/A;   IVC FILTER INSERTION N/A 03/19/2022   Procedure: IVC FILTER INSERTION;  Surgeon: Serafina Mitchell, MD;  Location: [place] CV LAB;  Service: Cardiovascular;  Laterality: N/A;   LEFT HEART CATH AND CORONARY ANGIOGRAPHY N/A 12/24/2020   Procedure: LEFT HEART CATH AND CORONARY ANGIOGRAPHY and possible PCI and stent;  Surgeon: Yolonda Kida, MD;  Location: [place] CV LAB;  Service: Cardiovascular;  Laterality: N/A;   LEFT HEART CATH AND CORONARY ANGIOGRAPHY N/A 01/06/2021   Procedure: LEFT HEART CATH AND CORONARY  ANGIOGRAPHY;  Surgeon: Troy Sine, MD;  Location: [place] CV LAB;  Service: Cardiovascular;  Laterality: N/A;   LOWER EXTREMITY ANGIOGRAPHY Left 03/11/2022   Procedure: Lower Extremity Angiography;  Surgeon: Katha Cabal, MD;  Location: [place] CV LAB;  Service: Cardiovascular;  Laterality: Left;   PENILE PROSTHESIS IMPLANT N/A 12/06/2013   Procedure: PENILE PROTHESIS INFLATABLE;  Surgeon: Ailene Rud, MD;  Location: [facility];  Service: Urology;  Laterality: N/A;   PROSTATE CRYOABLATION  06-01-2012  [facility]   PULMONARY THROMBECTOMY N/A 03/18/2022   Procedure: PULMONARY THROMBECTOMY;  Surgeon: Algernon Huxley, MD;  Location: [place] CV LAB;  Service: Cardiovascular;  Laterality: N/A;   PULMONARY THROMBECTOMY Bilateral 03/23/2022   Procedure: PULMONARY THROMBECTOMY;  Surgeon: Katha Cabal, MD;  Location: [place] CV LAB;  Service: Cardiovascular;  Laterality: Bilateral;   THROMBECTOMY ILIAC ARTERY Left 03/12/2022   Procedure: THROMBECTOMY LEG;  Surgeon: Conrad West Hammond, MD;  Location: [facility] ORS;  Service: Vascular;  Laterality: Left;   TRANSURETHRAL RESECTION OF BLADDER NECK N/A 03/20/2015   Procedure: RELEASE BLADDER NECK CONTRACTURE  WITH WOLF BUTTON ELECTRODE ;  Surgeon: Carolan Clines, MD;  Location: [place];  Service: Urology;  Laterality: N/A;   TRANSURETHRAL RESECTION OF BLADDER TUMOR N/A 12/05/2018   Procedure: TRANSURETHRAL RESECTION OF BLADDER TUMOR (TURBT)/CYSTOSCOPY/  INSTILLATION OF Rodell Perna;  Surgeon: Ceasar Mons, MD;  Location: [facility];  Service: Urology;  Laterality: N/A;   TRANSURETHRAL RESECTION OF BLADDER TUMOR N/A 01/15/2020   Procedure: TRANSURETHRAL RESECTION OF BLADDER TUMOR (TURBT)/ CYSTOSCOPY;  Surgeon: Ceasar Mons, MD;  Location: [facility];  Service: Urology;  Laterality: N/A;   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      Patient Active Problem List   Diagnosis Date Noted   Iron deficiency anemia 06/20/2022   Myeloproliferative disorder ([place]) 05/19/2022   History of bladder cancer 05/19/2022   Goals of care, counseling/discussion 03/23/2022   Reactive thrombocytosis 03/21/2022   Paroxysmal atrial fibrillation with RVR ([place]) 03/20/2022   Acute bilateral deep vein thrombosis (DVT) of femoral veins (HCC) 03/20/2022   Acute respiratory failure with hypoxia (HCC) 03/16/2022   Acute on chronic diastolic CHF (congestive heart failure) ([place]) 03/16/2022   PSVT (paroxysmal supraventricular tachycardia) 03/16/2022   AKI (acute kidney injury) ([place]) 03/15/2022   Critical limb ischemia of left lower extremity with ulceration of lower leg ([place]) 03/15/2022   Arterial occlusion 03/11/2022   Elevated troponin    Unstable angina ([place]) 01/05/2021   Hyperlipidemia LDL goal <70    CKD (chronic kidney disease), stage III (HCC)    Coronary artery disease    NSTEMI (non-ST elevated myocardial infarction) ([place]) 12/23/2020   Acquired hypothyroidism 04/20/2020   Chemotherapy-induced neuropathy ([place]) 04/20/2020   Nonthrombocytopenic purpura ([place]) 12/03/2019   Hypercoagulable state ([place]) 12/10/2018   Recurrent deep vein thrombosis (DVT) ([place])  Bilateral pulmonary embolism ([place]) 12/07/2018   GERD (gastroesophageal reflux disease) 12/07/2018   HTN (hypertension) 12/07/2018   HLD (hyperlipidemia) 12/07/2018   Prostate cancer ([place]) 12/07/2018   Bladder cancer ([place]) 12/07/2018   DM type 2 with diabetic mixed hyperlipidemia ([place]) 04/04/2018   Pain in right hand 10/04/2017   Tubular adenoma 08/67/6195   Helicobacter pylori (H. pylori) infection 09/04/2017   Medicare annual wellness visit, initial 03/31/2017   History of prostate cancer 10/27/2014   Lower urinary tract symptoms (LUTS) 03/18/2012   Nephrolithiasis 03/18/2012    PCP: Rusty Aus, MD  REFERRING PROVIDER: Kris Hartmann, NP  REFERRING DIAG: Left below  knee amputee  THERAPY DIAG:  Gait difficulty  Muscle weakness (generalized)  Left below-knee amputee [facility])  Rationale for Evaluation and Treatment Rehabilitation  ONSET DATE: 03/16/22  SUBJECTIVE:   SUBJECTIVE STATEMENT:   EVALUATION Pt. S/p R BKA after blood clots in lower leg.  Pt. Reports minimum phantom limb symptoms and occasional muscle spasms.  Pt. Takes Baclofen to manage symptoms.  Pt. Received prosthetic leg on Wednesday from [facility].  Pt. Has h/o low back pain and has received spinal epidurals in past (2 years ago).    PERTINENT HISTORY: 1. Hx of BKA, left [facility]) Mr. Arlie Riker was seen for further evaluation for left below knee prosthesis.  The is a 76 year old male who had amputation on 03/16/2022.  At the time he is well healed and ready for fitting of new below knee prosthesis.  He is a highly motivated individual and should do well once fitted with prosthesis.  She has no problem returning to a K2 ambulator, which will allow him to walk inside and outside of his home and overload level barriers.    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  He will be having a bone marrow biopsy.  We will continue to have the patient keep his IVC filter for now as there may be further test.  We will have the patient return in 3 months to discuss filter removal.    PAIN:  Are you having pain? No  PRECAUTIONS: None  WEIGHT BEARING RESTRICTIONS No  FALLS:  Has patient fallen in last 6 months? No  LIVING ENVIRONMENT: Lives with: lives with their spouse Lives in: House/apartment Stairs: Yes: Internal: 13+ steps; on right going up Has following equipment at home: Single point cane and Walker - 2 wheeled  OCCUPATION: Retired  PLOF: [place] with walking and return to playing golf by spring.     OBJECTIVE:   PATIENT SURVEYS:  FOTO initial 40/ goal 61.    COGNITION:  Overall cognitive  status: Within functional limits for tasks assessed     SENSATION: WFL  EDEMA:  Circumferential: L/R knee joint line (34/36 cm.), calf (30.5/ 33 cm), distal quad (35/36 cm).    POSTURE: rounded shoulders and forward head  PALPATION: No tenderness/ good incision healing on residual limb  LOWER EXTREMITY ROM:  B LE WFL (all planes).  L knee 0-130 deg.   LOWER EXTREMITY MMT:  R LE muscle strength grossly 5/5 MMT except hip flexion 4+/5 MMT.  L LE strength grossly 5/5 MMT except hip flexion 4/5 MMT and hip abduction 4+/5 MMT.    FUNCTIONAL TESTS:  5 times sit to stand: 14.1 sec. And requires R UE assist.  Able to stand from chair with addition of 2" Airex pad.    GAIT: Distance walked: in  clinic/ //-bars Assistive device utilized: Environmental consultant - 2 wheeled Level of assistance: Min A Comments: Ambulates in clinic with RW and min. A/ cuing for proper technique and step pattern.  Pt. Able to ambulate in //-bars with R UE assist only and 2-point gait pattern.      TODAY'S TREATMENT:  07/27/22:  Subjective: Pt. Entered PT with no SPC (reports he forgot SPC at [place] this morning) and demonstrates a more normalized/ consistent step pattern.  Pt. States he is wearing prosthetic leg 8 hours/day.  No pain reported.    There.ex.:  IYME: 15  (marked improvement).    Nustep L5 10 min. B LE only (consistent cadence).  Discussed weekend activities.    BOSU step ups in //-bars with light UE assist/ reverse BOSU wt. Shifting/ partial squats 5x2.    Resisted gait 2BTB 5x all 4-planes with light UE assist and mirror feedback.  Slight increase in L residual limb discomfort.     Gait training:  Walking in clinic/ gym with no assistive device and working on recip. Pattern/ heel strike.  Walking in hallway with increase cadence.    Walking outside around clinic/ parking lot/ ascending and descending outside stairs with R handrail and L SPC for safety.  Working on descending ramps with  consistent heel strike/ step pattern.     Reassessment of residual limb/ no issues.    PATIENT EDUCATION:  Education details: Reviewed HEP Person educated: Patient Education method: Explanation, Demonstration, and Handouts Education comprehension: verbalized understanding and returned demonstration   HOME EXERCISE PROGRAM: See handouts.   ASSESSMENT:  CLINICAL IMPRESSION: Patient is highly motivated and hard working during standing/ walking tasks today.  Pt. Able to demonstrate a recip. Gait pattern without use of SPC safely.  Pt. Able to ascend/ descend stairs with recip. Pattern but requires at least 1 UE assist.  Pt. Instructed to chip some golf balls at the range and work on wt. Shifting/ balance.  See updated goals.  Pt. Will benefit from skilled PT services to increase LE muscle strength to improve independence with walking/ return to playing golf.     OBJECTIVE IMPAIRMENTS Abnormal gait, decreased activity tolerance, decreased balance, decreased endurance, decreased mobility, difficulty walking, decreased strength, impaired flexibility, improper body mechanics, postural dysfunction, and pain.   ACTIVITY LIMITATIONS carrying, lifting, bending, standing, squatting, stairs, transfers, toileting, dressing, and locomotion level  PARTICIPATION LIMITATIONS: driving, community activity, and yard work  PERSONAL FACTORS Fitness and Past/current experiences are also affecting patient's functional outcome.   REHAB POTENTIAL: Good  CLINICAL DECISION MAKING: Stable/uncomplicated  EVALUATION COMPLEXITY: Low   GOALS: Goals reviewed with patient? Yes  SHORT TERM GOALS: Target date: 08/10/22 Pt. Independent with HEP to increase B hip strength 1/2 muscle grade to improve standing/walking mod. Independence.   Baseline:  see above.  L hip flexion 4/5 MMT.  Goal status: Partially met    LONG TERM GOALS: Target date: 08/26/22  Pt. Will increase FOTO to 59 to improve functional  mobility.   Baseline:  initial FOTO 40.  11/8: 68 Goal status: Goal met  2.  Pt. Able to ascend/descend 13 stairs with R handrail and consistent step pattern to improve pts. Ability to go to 2nd floor of home.  11/8: recip. Gait on stairs outside with R handrail for safety.   Baseline:  Goal status: Goal met  3.  Pt. Will ambulate with mod. I and SPC with consistent 2-point gait pattern and no loss of balance to improve independence at home/ community.  Baseline:  11/8: pt. Ambulates community distances Goal status: Goal met  4.  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    Baseline:  pt. Requires extra time/ SPC while walking outside and limited with carrying  5.  Pt. Able to return to hitting golf balls/ playing round of golf with no limitations safely.    Baseline:  pt. Demonstrates chipping golf balls in gym with SBA/CGA    PLAN: PT FREQUENCY: 2x/week  PT DURATION: 4 weeks  PLANNED INTERVENTIONS: Therapeutic exercises, Therapeutic activity, Neuromuscular re-education, Balance training, Gait training, Patient/Family education, Self Care, Joint mobilization, Stair training, Prosthetic training, Manual therapy, and Re-evaluation  PLAN FOR NEXT SESSION: Progress gait with least assistive device.  Simulate golf swing  Pura Spice, PT, DPT # [phone] 07/27/2022, 10:50 AM

## 2022-07-28 ENCOUNTER — Ambulatory Visit (INDEPENDENT_AMBULATORY_CARE_PROVIDER_SITE_OTHER): Payer: Medicare HMO | Admitting: Urology

## 2022-07-28 ENCOUNTER — Encounter: Payer: Self-pay | Admitting: Urology

## 2022-07-28 VITALS — BP 154/83 | HR 70 | Ht 68.0 in | Wt 164.0 lb

## 2022-07-28 DIAGNOSIS — Z8546 Personal history of malignant neoplasm of prostate: Secondary | ICD-10-CM

## 2022-07-28 DIAGNOSIS — C674 Malignant neoplasm of posterior wall of bladder: Secondary | ICD-10-CM

## 2022-07-28 DIAGNOSIS — N529 Male erectile dysfunction, unspecified: Secondary | ICD-10-CM

## 2022-07-28 DIAGNOSIS — Z8551 Personal history of malignant neoplasm of bladder: Secondary | ICD-10-CM | POA: Diagnosis not present

## 2022-07-28 DIAGNOSIS — C61 Malignant neoplasm of prostate: Secondary | ICD-10-CM

## 2022-07-28 LAB — URINALYSIS, COMPLETE
Bilirubin, UA: NEGATIVE
Glucose, UA: NEGATIVE
Leukocytes,UA: NEGATIVE
Nitrite, UA: NEGATIVE
Protein,UA: NEGATIVE
RBC, UA: NEGATIVE
Specific Gravity, UA: 1.02 (ref 1.005–1.030)
Urobilinogen, Ur: 0.2 mg/dL (ref 0.2–1.0)
pH, UA: 5.5 (ref 5.0–7.5)

## 2022-07-28 LAB — BLADDER SCAN AMB NON-IMAGING

## 2022-07-28 LAB — MICROSCOPIC EXAMINATION: Bacteria, UA: NONE SEEN

## 2022-07-28 NOTE — Progress Notes (Signed)
07/28/22 5:28 PM   Phillip [name] August 31, 1946 [id number]  CC: History of prostate cancer, history of bladder cancer, ED with penile prosthesis, incontinence with artificial urinary sphincter, low testosterone, problems with orgasm  HPI: 76 year old male with complex urologic history transferring his care here from Dr. Gilford Rile at [facility] urology in [place].  He also follows with a urologist Dr. Marius Ditch at [facility] who places artificial urinary sphincter.  Briefly, his urologic history is notable for prostate cancer treated with cryoablation complicated by ED requiring a penile prosthesis placed by Dr. Gaynelle Arabian, history of bladder cancer and bladder neck contracture, with artificial sphincter placement in June 7564 at [facility](complicated by pulmonary embolus and extensive hospitalization including left lower extremity amputation), as well as low testosterone previously on testosterone replacement through Dr. Gilford Rile.  He has been off the testosterone since his complex hospitalization in June 2023.  He required multiple vascular surgeries at that time.  His bladder cancer reportedly was a low-grade noninvasive, and most recent biopsy from 2021 showed only benign tissue.  His penile prosthesis continues to function, but his primary complaint today is difficulty achieving orgasm since he stopped the testosterone.  His artificial urinary sphincter is working appropriately, but he does have urinary frequency.  PSA has remained undetectable since treatment for prostate cancer, most recently in August 2023.   PMH: Past Medical History:  Diagnosis Date   Arthritis    Basal cell carcinoma    L clavicle, L prox forearm- removed years ago    Bladder cancer (HCC)    Bladder neck contracture    CAD (coronary artery disease)    a. 12/2020 NSTEMI/Cath: LM nl, LAD 95ost/p, LCX 50p, RCA nl. EF 45-50%.   Cataract    CKD (chronic kidney disease), stage III [facility])    ED (erectile dysfunction)     Frequency    GERD (gastroesophageal reflux disease)    History of pulmonary embolus (PE) 11/2018   a. Following LE DVT-->Chronic warfarin.   Hyperlipidemia LDL goal <70    Hypertension    Hypothyroidism    Incontinence of urine    sui, s/p cryoablation   Ischemic cardiomyopathy    a. 11/2018 Echo: EF 60-65%; b. 12/2020 LV Gram: EF 45-50% in setting of NSTEMI.   Kidney stones    Neuropathy    feet   Nocturia    Prostate cancer ([place])    S/P   CRYOABLATION   Right elbow tendinitis    Right Lower Extremity DVT (deep venous thrombosis) ([place]) 11/25/2018   Vertigo    1-2x/yr   Wears glasses     Surgical History: Past Surgical History:  Procedure Laterality Date   AMPUTATION Left 03/16/2022   Procedure: AMPUTATION BELOW KNEE;  Surgeon: Katha Cabal, MD;  Location: [facility];  Service: Vascular;  Laterality: Left;   ARTHROPLASTY AND TENDON REPAIR LEFT THUMB  JAN 2014   CATARACT EXTRACTION W/PHACO Left 11/16/2020   Procedure: CATARACT EXTRACTION PHACO AND INTRAOCULAR LENS PLACEMENT (IOC) LEFT  7.09 00:56.4 12.6%;  Surgeon: Leandrew Koyanagi, MD;  Location: [place];  Service: Ophthalmology;  Laterality: Left;   CATARACT EXTRACTION W/PHACO Right 12/02/2020   Procedure: CATARACT EXTRACTION PHACO AND INTRAOCULAR LENS PLACEMENT (IOC) RIGHT MALYUGIN 5.52 00:49.7 11.1%;  Surgeon: Leandrew Koyanagi, MD;  Location: [place];  Service: Ophthalmology;  Laterality: Right;   COLONOSCOPY     COLONOSCOPY WITH PROPOFOL N/A 08/09/2017   Procedure: COLONOSCOPY WITH PROPOFOL;  Surgeon: Manya Silvas, MD;  Location: [facility] ENDOSCOPY;  Service: Endoscopy;  Laterality: N/A;   CORONARY ANGIOGRAPHY N/A 12/28/2020   Procedure: CORONARY ANGIOGRAPHY;  Surgeon: Sherren Mocha, MD;  Location: [place] CV LAB;  Service: Cardiovascular;  Laterality: N/A;   CORONARY STENT INTERVENTION N/A 12/28/2020   Procedure: CORONARY STENT INTERVENTION;  Surgeon: Sherren Mocha, MD;  Location: [place] CV LAB;  Service: Cardiovascular;  Laterality: N/A;   ESOPHAGOGASTRODUODENOSCOPY (EGD) WITH PROPOFOL N/A 08/09/2017   Procedure: ESOPHAGOGASTRODUODENOSCOPY (EGD) WITH PROPOFOL;  Surgeon: Manya Silvas, MD;  Location[facility] ENDOSCOPY;  Service: Endoscopy;  Laterality: N/A;   GREEN LIGHT LASER TURP (TRANSURETHRAL RESECTION OF PROSTATE N/A 12/14/2015   Procedure: GREEN LIGHT LASER TURP (TRANSURETHRAL RESECTION OF PROSTATE) LASER OF BLADDER NECK CONTRACTURE;  Surgeon: Carolan Clines, MD;  Location: [place];  Service: Urology;  Laterality: N/A;   IVC FILTER INSERTION N/A 03/19/2022   Procedure: IVC FILTER INSERTION;  Surgeon: Serafina Mitchell, MD;  Location: [place] CV LAB;  Service: Cardiovascular;  Laterality: N/A;   LEFT HEART CATH AND CORONARY ANGIOGRAPHY N/A 12/24/2020   Procedure: LEFT HEART CATH AND CORONARY ANGIOGRAPHY and possible PCI and stent;  Surgeon: Yolonda Kida, MD;  Location: [place] CV LAB;  Service: Cardiovascular;  Laterality: N/A;   LEFT HEART CATH AND CORONARY ANGIOGRAPHY N/A 01/06/2021   Procedure: LEFT HEART CATH AND CORONARY ANGIOGRAPHY;  Surgeon: Troy Sine, MD;  Location: [place] CV LAB;  Service: Cardiovascular;  Laterality: N/A;   LOWER EXTREMITY ANGIOGRAPHY Left 03/11/2022   Procedure: Lower Extremity Angiography;  Surgeon: Katha Cabal, MD;  Location: [place] CV LAB;  Service: Cardiovascular;  Laterality: Left;   PENILE PROSTHESIS IMPLANT N/A 12/06/2013   Procedure: PENILE PROTHESIS INFLATABLE;  Surgeon: Ailene Rud, MD;  Location[facility];  Service: Urology;  Laterality: N/A;   PROSTATE CRYOABLATION  06-01-2012  [facility]   PULMONARY THROMBECTOMY N/A 03/18/2022   Procedure: PULMONARY THROMBECTOMY;  Surgeon: Algernon Huxley, MD;  Location: [place] CV LAB;  Service: Cardiovascular;  Laterality: N/A;   PULMONARY THROMBECTOMY Bilateral 03/23/2022   Procedure: PULMONARY THROMBECTOMY;   Surgeon: Katha Cabal, MD;  Location: [place] CV LAB;  Service: Cardiovascular;  Laterality: Bilateral;   THROMBECTOMY ILIAC ARTERY Left 03/12/2022   Procedure: THROMBECTOMY LEG;  Surgeon: Conrad Hopewell, MD;  Location: [facility] OR[facility];  Service: Vascular;  Laterality: Left;   TRANSURETHRAL RESECTION OF BLADDER NECK N/A 03/20/2015   Procedure: RELEASE BLADDER NECK CONTRACTURE  WITH WOLF BUTTON ELECTRODE ;  Surgeon: Carolan Clines, MD;  Location: [place];  Service: Urology;  Laterality: N/A;   TRANSURETHRAL RESECTION OF BLADDER TUMOR N/A 12/05/2018   Procedure: TRANSURETHRAL RESECTION OF BLADDER TUMOR (TURBT)/CYSTOSCOPY/  INSTILLATION OF Rodell Perna;  Surgeon: Ceasar Mons, MD;  Location: [facility];  Service: Urology;  Laterality: N/A;   TRANSURETHRAL RESECTION OF BLADDER TUMOR N/A 01/15/2020   Procedure: TRANSURETHRAL RESECTION OF BLADDER TUMOR (TURBT)/ CYSTOSCOPY;  Surgeon: Ceasar Mons, MD;  Location: [facility];  Service: Urology;  Laterality: N/A;   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        Family History: Family History  Problem Relation Age of Onset   CAD Father 31    Social History:  reports that he has never smoked. He has never been exposed to tobacco smoke. He has never used smokeless tobacco. He reports that he does not drink alcohol and does not use drugs.  Physical Exam: BP Marland Kitchen)  154/83   Pulse 70   Ht '5\' 8"'$  (1.727 m)   Wt 164 lb (74.4 kg)   BMI 24.94 kg/m    Constitutional:  Alert and oriented, No acute distress. Cardiovascular: No clubbing, cyanosis, or edema. Respiratory: Normal respiratory effort, no increased work of breathing. GI: Abdomen is soft, nontender, nondistended, no abdominal masses   Pertinent Imaging: I have personally viewed and interpreted the CT pelvis from August 2023 with IPP and AUS in appropriate position and no abnormalities  noted  Assessment & Plan:   Very complex and comorbid 76 year old male with recent PE and multiple vascular procedures in June 2023 after an artificial urinary sphincter placed at [facility].  Urologic issues include prostate cancer treated with cryoablation in 2013 with undetectable PSA since that time, low-grade noninvasive bladder cancer with most recent cystoscopy May 2023.  Artificial urinary sphincter and inflatable penile prosthesis are working appropriately.  Regarding his history of low-grade noninvasive bladder cancer, I recommended routine follow-up cystoscopy in May 2023 with yearly cystoscopy.    Continue yearly PSA for history of prostate cancer.  We discussed the risks of testosterone with thromboembolic events, in the setting of his recent PE I strongly discouraged him from resuming testosterone at this point.  It is possible that resuming the testosterone may improve his orgasm symptoms, however I think the risks outweigh the benefit at this time.  We will rediscuss at follow-up.  -RTC May 2024 for cystoscopy for history of bladder cancer (*patient has artificial urinary sphincter) -Consider resuming testosterone in the future, though I discouraged him from this at least in the short-term with his complex history and recent hospitalization for PE  I spent 65 total minutes on the day of the encounter including pre-visit review of the medical record, face-to-face time with the patient, and post visit ordering of labs/imaging/tests.  Nickolas Madrid, MD 07/28/2022  [facility] [address] [place], [place] [address] [phone]

## 2022-08-01 ENCOUNTER — Encounter: Payer: Self-pay | Admitting: Emergency Medicine

## 2022-08-01 ENCOUNTER — Emergency Department: Payer: Medicare HMO

## 2022-08-01 ENCOUNTER — Ambulatory Visit: Payer: Medicare HMO | Admitting: Physical Therapy

## 2022-08-01 ENCOUNTER — Emergency Department
Admission: EM | Admit: 2022-08-01 | Discharge: 2022-08-01 | Disposition: A | Discharge status: Home / Self Care | Payer: Medicare HMO | Attending: Emergency Medicine | Admitting: Emergency Medicine

## 2022-08-01 ENCOUNTER — Other Ambulatory Visit: Payer: Self-pay

## 2022-08-01 DIAGNOSIS — M6281 Muscle weakness (generalized): Secondary | ICD-10-CM

## 2022-08-01 DIAGNOSIS — I700 Atherosclerosis of aorta: Secondary | ICD-10-CM | POA: Diagnosis not present

## 2022-08-01 DIAGNOSIS — R102 Pelvic and perineal pain: Secondary | ICD-10-CM | POA: Diagnosis not present

## 2022-08-01 DIAGNOSIS — Z8551 Personal history of malignant neoplasm of bladder: Secondary | ICD-10-CM | POA: Insufficient documentation

## 2022-08-01 DIAGNOSIS — Z9689 Presence of other specified functional implants: Secondary | ICD-10-CM

## 2022-08-01 DIAGNOSIS — Z7901 Long term (current) use of anticoagulants: Secondary | ICD-10-CM | POA: Insufficient documentation

## 2022-08-01 DIAGNOSIS — T83420A Displacement of penile (implanted) prosthesis, initial encounter: Secondary | ICD-10-CM | POA: Insufficient documentation

## 2022-08-01 DIAGNOSIS — R269 Unspecified abnormalities of gait and mobility: Secondary | ICD-10-CM

## 2022-08-01 DIAGNOSIS — R109 Unspecified abdominal pain: Secondary | ICD-10-CM | POA: Diagnosis not present

## 2022-08-01 DIAGNOSIS — Z89512 Acquired absence of left leg below knee: Secondary | ICD-10-CM

## 2022-08-01 DIAGNOSIS — Z8546 Personal history of malignant neoplasm of prostate: Secondary | ICD-10-CM | POA: Diagnosis not present

## 2022-08-01 DIAGNOSIS — Z7902 Long term (current) use of antithrombotics/antiplatelets: Secondary | ICD-10-CM | POA: Diagnosis not present

## 2022-08-01 LAB — CBC WITH DIFFERENTIAL/PLATELET
Abs Immature Granulocytes: 0.03 10*3/uL (ref 0.00–0.07)
Basophils Absolute: 0.1 10*3/uL (ref 0.0–0.1)
Basophils Relative: 1 %
Eosinophils Absolute: 0.2 10*3/uL (ref 0.0–0.5)
Eosinophils Relative: 2 %
HCT: 43.4 % (ref 39.0–52.0)
Hemoglobin: 13.5 g/dL (ref 13.0–17.0)
Immature Granulocytes: 0 %
Lymphocytes Relative: 23 %
Lymphs Abs: 2 10*3/uL (ref 0.7–4.0)
MCH: 28.4 pg (ref 26.0–34.0)
MCHC: 31.1 g/dL (ref 30.0–36.0)
MCV: 91.2 fL (ref 80.0–100.0)
Monocytes Absolute: 0.6 10*3/uL (ref 0.1–1.0)
Monocytes Relative: 7 %
Neutro Abs: 5.7 10*3/uL (ref 1.7–7.7)
Neutrophils Relative %: 67 %
Platelets: 420 10*3/uL — ABNORMAL HIGH (ref 150–400)
RBC: 4.76 MIL/uL (ref 4.22–5.81)
RDW: 22.8 % — ABNORMAL HIGH (ref 11.5–15.5)
Smear Review: NORMAL
WBC: 8.7 10*3/uL (ref 4.0–10.5)
nRBC: 0 % (ref 0.0–0.2)

## 2022-08-01 LAB — HEPATIC FUNCTION PANEL
ALT: 17 U/L (ref 0–44)
AST: 19 U/L (ref 15–41)
Albumin: 3.8 g/dL (ref 3.5–5.0)
Alkaline Phosphatase: 47 U/L (ref 38–126)
Bilirubin, Direct: <0.1 mg/dL (ref 0.0–0.2)
Total Bilirubin: 0.8 mg/dL (ref 0.3–1.2)
Total Protein: 6.9 g/dL (ref 6.5–8.1)

## 2022-08-01 LAB — BASIC METABOLIC PANEL
Anion gap: 6 (ref 5–15)
BUN: 19 mg/dL (ref 8–23)
CO2: 26 mmol/L (ref 22–32)
Calcium: 8.9 mg/dL (ref 8.9–10.3)
Chloride: 110 mmol/L (ref 98–111)
Creatinine, Ser: 1.19 mg/dL (ref 0.61–1.24)
GFR, Estimated: >60 mL/min (ref >60)
Glucose, Bld: 121 mg/dL — ABNORMAL HIGH (ref 70–99)
Potassium: 4 mmol/L (ref 3.5–5.1)
Sodium: 142 mmol/L (ref 135–145)

## 2022-08-01 LAB — URINALYSIS, ROUTINE W REFLEX MICROSCOPIC
Bilirubin Urine: NEGATIVE
Glucose, UA: NEGATIVE mg/dL
Hgb urine dipstick: NEGATIVE
Ketones, ur: NEGATIVE mg/dL
Leukocytes,Ua: NEGATIVE
Nitrite: NEGATIVE
Protein, ur: NEGATIVE mg/dL
Specific Gravity, Urine: 1.014 (ref 1.005–1.030)
pH: 5 (ref 5.0–8.0)

## 2022-08-01 LAB — LIPASE, BLOOD: Lipase: 40 U/L (ref 11–51)

## 2022-08-01 MED ORDER — IOHEXOL 300 MG/ML  SOLN
100.0000 mL | Freq: Once | INTRAMUSCULAR | Status: AC | PRN
  Administered 2022-08-01: 100 mL via INTRAVENOUS

## 2022-08-01 MED ORDER — OXYCODONE HCL 5 MG PO TABS
2.5000 mg | ORAL_TABLET | Freq: Once | ORAL | Status: AC
  Administered 2022-08-01: 2.5 mg via ORAL
  Filled 2022-08-01: qty 1

## 2022-08-01 MED ORDER — ACETAMINOPHEN 500 MG PO TABS
1000.0000 mg | ORAL_TABLET | Freq: Once | ORAL | Status: AC
  Administered 2022-08-01: 1000 mg via ORAL
  Filled 2022-08-01: qty 2

## 2022-08-01 NOTE — ED Provider Triage Note (Signed)
Emergency Medicine Provider Triage Evaluation Note  Phillip [name] , a 76 y.o. male  was evaluated in triage.  Pt complains of pelvic pain.  History of BKA left side, history of blood clots, is on Plavix and Eliquis.  Review of Systems  Positive:  Negative:   Physical Exam  BP (!) 153/78 (BP Location: Left Arm)   Pulse 64   Temp 97.7 F (36.5 C) (Oral)   Resp 16   SpO2 100%  Gen:   Awake, no distress   Resp:  Normal effort  MSK:   Moves extremities without difficulty  Other:    Medical Decision Making  Medically screening exam initiated at 12:54 PM.  Appropriate orders placed.  Phillip [name] was informed that the remainder of the evaluation will be completed by another provider, this initial triage assessment does not replace that evaluation, and the importance of remaining in the ED until their evaluation is complete.     Versie Starks, PA-C 08/01/22 1255

## 2022-08-01 NOTE — ED Notes (Signed)
First Nurse Note: Pt to ED via [facility] for pelvic pain

## 2022-08-01 NOTE — ED Provider Notes (Signed)
[facility] Provider Note    Event Date/Time   First MD Initiated Contact with Patient 08/01/22 1502     (approximate)   History   Pelvic Pain   HPI  Phillip [name] is a 76 y.o. male with history of bladder cancer, prostate cancer now with penile prosthesis with artificial urinary sphincter who comes in with concerns for lower pelvic discomfort.  He reports having it about a week ago was seen by his primary care doctor but it went away without any medications however he reports the pain has come back since yesterday and seems to be pretty aggravating to him.  Denies any issues or pain in his testicles or penis or urinary symptoms.  He reports its more of in his lower pelvis area.  He denies any chest pain, shortness of breath, falls hitting his head or other concerns.  Patient is on Eliquis and Plavix.  He reports being seen for his follow-up in November 9 by urology and at that time he did not even mention that the pain because it had not started yet.  He had a good checkup at that time.   Physical Exam   Triage Vital Signs: ED Triage Vitals  Enc Vitals Group     BP 08/01/22 1250 (!) 153/78     Pulse Rate 08/01/22 1250 64     Resp 08/01/22 1250 16     Temp 08/01/22 1250 97.7 F (36.5 C)     Temp Source 08/01/22 1250 Oral     SpO2 08/01/22 1250 100 %     Weight 08/01/22 1725 163 lb 12.8 oz (74.3 kg)     Height 08/01/22 1725 '5\' 8"'$  (1.727 m)     Head Circumference --      Peak Flow --      Pain Score 08/01/22 1250 7     Pain Loc --      Pain Edu? --      Excl. in [place]? --     Most recent vital signs: Vitals:   08/01/22 1250 08/01/22 1727  BP: (!) 153/78 (!) 150/70  Pulse: 64 60  Resp: 16 16  Temp: 97.7 F (36.5 C) 98 F (36.7 C)  SpO2: 100% 98%     General: Awake, no distress.  CV:  Good peripheral perfusion.  Resp:  Normal effort.  Abd:  No distention.  He has some very low pelvic pain with palpation. Other:  Patient has been BKA on  the left.   ED Results / Procedures / Treatments   Labs (all labs ordered are listed, but only abnormal results are displayed) Labs Reviewed  URINALYSIS, ROUTINE W REFLEX MICROSCOPIC - Abnormal; Notable for the following components:      Result Value   Color, Urine YELLOW (*)    APPearance HAZY (*)    All other components within normal limits  CBC WITH DIFFERENTIAL/PLATELET - Abnormal; Notable for the following components:   RDW 22.8 (*)    Platelets 420 (*)    All other components within normal limits  BASIC METABOLIC PANEL - Abnormal; Notable for the following components:   Glucose, Bld 121 (*)    All other components within normal limits  HEPATIC FUNCTION PANEL  LIPASE, BLOOD       RADIOLOGY I have reviewed the CT personally and interpreted no distended bladder noted   PROCEDURES:  Critical Care performed: No  Procedures   MEDICATIONS ORDERED IN ED: Medications  oxyCODONE (Oxy IR/ROXICODONE) immediate  release tablet 2.5 mg (2.5 mg Oral Given 08/01/22 1556)  acetaminophen (TYLENOL) tablet 1,000 mg (1,000 mg Oral Given 08/01/22 1556)  iohexol (OMNIPAQUE) 300 MG/ML solution 100 mL (100 mLs Intravenous Contrast Given 08/01/22 1739)     IMPRESSION / MDM / ASSESSMENT AND PLAN / ED COURSE  I reviewed the triage vital signs and the nursing notes.   Patient's presentation is most consistent with acute presentation with potential threat to life or bodily function.   Patient comes in with concerns for lower pelvic discomfort with significant urological history.  Will get CT scan to rule out any type of diverticulitis, abscess, perforation or other acute pathology.  We will give a dose of oxycodone and Tylenol to try to help with pain.   Lipase is normal.  Hepatic function normal CBC slightly elevated platelets similar to prior.  BMP normal  IMPRESSION: 1. No acute intra-abdominal or intrapelvic process. 2. IVC filter. 3.  Aortic Atherosclerosis (ICD10-I70.0).    Reevaluate patient still has a little bit of the pain right over his bladder.  I did a examination he is got penile prosthesis fell in the penis as well as in the right testicle.  He is got no testicle tenderness swelling or discoloration noted.  His pain is seems to be more suprapubic in nature.  He denies it being like a bladder spasm.  I discussed with patient that this is where his reservoir is for his penile prosthesis are not sure if it could be related to this but at this time I do not see any signs for it being related to an acute pathology need to be diagnosed here in the emergency room.  He is afebrile, well-appearing with normal white count therefore I considered admission but I feel like patient is safe to be discharged home.  I sent a message to Dr. Bernardo Heater and they will try to get him in the office the next 1 to 2 days but denied there being any other emergent cause of pain from the reservoir.  Patient expressed understanding tolerating p.o. and feels comfortable with this plan   The patient is on the cardiac monitor to evaluate for evidence of arrhythmia and/or significant heart rate changes.      FINAL CLINICAL IMPRESSION(S) / ED DIAGNOSES   Final diagnoses:  Pelvic pain  History of implantation of penile prosthesis     Rx / DC Orders   ED Discharge Orders     None        Note:  This document was prepared using Dragon voice recognition software and may include unintentional dictation errors.   Vanessa [place], MD 08/01/22 Einar Crow

## 2022-08-01 NOTE — Therapy (Signed)
OUTPATIENT PHYSICAL THERAPY LOWER EXTREMITY TREATMENT b Patient Name: Phillip [name] MRN: [id number] DOB:[birth year], 76 y.o., male Today's Date: 08/01/2022   PT End of Session - 08/01/22 1054     Visit Number 9    Number of Visits 17    Date for PT Re-Evaluation 08/26/22    PT Start Time 1111    PT Stop Time 1120    PT Time Calculation (min) 9 min    Activity Tolerance --             Past Medical History:  Diagnosis Date   Arthritis    Basal cell carcinoma    L clavicle, L prox forearm- removed years ago    Bladder cancer ([place])    Bladder neck contracture    CAD (coronary artery disease)    a. 12/2020 NSTEMI/Cath: LM nl, LAD 95ost/p, LCX 50p, RCA nl. EF 45-50%.   Cataract    CKD (chronic kidney disease), stage III [facility])    ED (erectile dysfunction)    Frequency    GERD (gastroesophageal reflux disease)    History of pulmonary embolus (PE) 11/2018   a. Following LE DVT-->Chronic warfarin.   Hyperlipidemia LDL goal <70    Hypertension    Hypothyroidism    Incontinence of urine    sui, s/p cryoablation   Ischemic cardiomyopathy    a. 11/2018 Echo: EF 60-65%; b. 12/2020 LV Gram: EF 45-50% in setting of NSTEMI.   Kidney stones    Neuropathy    feet   Nocturia    Prostate cancer ([name])    S/P   CRYOABLATION   Right elbow tendinitis    Right Lower Extremity DVT (deep venous thrombosis) ([place]) 11/25/2018   Vertigo    1-2x/yr   Wears glasses    Past Surgical History:  Procedure Laterality Date   AMPUTATION Left 03/16/2022   Procedure: AMPUTATION BELOW KNEE;  Surgeon: Katha Cabal, MD;  Location: [facility] ORS;  Service: Vascular;  Laterality: Left;   ARTHROPLASTY AND TENDON REPAIR LEFT THUMB  JAN 2014   CATARACT EXTRACTION W/PHACO Left 11/16/2020   Procedure: CATARACT EXTRACTION PHACO AND INTRAOCULAR LENS PLACEMENT (IOC) LEFT  7.09 00:56.4 12.6%;  Surgeon: Leandrew Koyanagi, MD;  Location: [place];  Service: Ophthalmology;  Laterality: Left;    CATARACT EXTRACTION W/PHACO Right 12/02/2020   Procedure: CATARACT EXTRACTION PHACO AND INTRAOCULAR LENS PLACEMENT (IOC) RIGHT MALYUGIN 5.52 00:49.7 11.1%;  Surgeon: Leandrew Koyanagi, MD;  Location: [place];  Service: Ophthalmology;  Laterality: Right;   COLONOSCOPY     COLONOSCOPY WITH PROPOFOL N/A 08/09/2017   Procedure: COLONOSCOPY WITH PROPOFOL;  Surgeon: Manya Silvas, MD;  Location: [facility] ENDOSCOPY;  Service: Endoscopy;  Laterality: N/A;   CORONARY ANGIOGRAPHY N/A 12/28/2020   Procedure: CORONARY ANGIOGRAPHY;  Surgeon: Sherren Mocha, MD;  Location: [place] CV LAB;  Service: Cardiovascular;  Laterality: N/A;   CORONARY STENT INTERVENTION N/A 12/28/2020   Procedure: CORONARY STENT INTERVENTION;  Surgeon: Sherren Mocha, MD;  Location: [place] [facility];  Service: Cardiovascular;  Laterality: N/A;   ESOPHAGOGASTRODUODENOSCOPY (EGD) WITH PROPOFOL N/A 08/09/2017   Procedure: ESOPHAGOGASTRODUODENOSCOPY (EGD) WITH PROPOFOL;  Surgeon: Manya Silvas, MD;  Location: [facility] ENDOSCOPY;  Service: Endoscopy;  Laterality: N/A;   GREEN LIGHT LASER TURP (TRANSURETHRAL RESECTION OF PROSTATE N/A 12/14/2015   Procedure: GREEN LIGHT LASER TURP (TRANSURETHRAL RESECTION OF PROSTATE) LASER OF BLADDER NECK CONTRACTURE;  Surgeon: Carolan Clines, MD;  Location: [place];  Service: Urology;  Laterality: N/A;  IVC FILTER INSERTION N/A 03/19/2022   Procedure: IVC FILTER INSERTION;  Surgeon: Serafina Mitchell, MD;  Location: [place] [facility] LAB;  Service: Cardiovascular;  Laterality: N/A;   LEFT HEART CATH AND CORONARY ANGIOGRAPHY N/A 12/24/2020   Procedure: LEFT HEART CATH AND CORONARY ANGIOGRAPHY and possible PCI and stent;  Surgeon: Yolonda Kida, MD;  Location: [place] CV LAB;  Service: Cardiovascular;  Laterality: N/A;   LEFT HEART CATH AND CORONARY ANGIOGRAPHY N/A 01/06/2021   Procedure: LEFT HEART CATH AND CORONARY ANGIOGRAPHY;  Surgeon: Troy Sine, MD;   Location: [place] CV LAB;  Service: Cardiovascular;  Laterality: N/A;   LOWER EXTREMITY ANGIOGRAPHY Left 03/11/2022   Procedure: Lower Extremity Angiography;  Surgeon: Katha Cabal, MD;  Location: [place] CV LAB;  Service: Cardiovascular;  Laterality: Left;   PENILE PROSTHESIS IMPLANT N/A 12/06/2013   Procedure: PENILE PROTHESIS INFLATABLE;  Surgeon: Ailene Rud, MD;  Location: [facility];  Service: Urology;  Laterality: N/A;   PROSTATE CRYOABLATION  06-01-2012  [facility]   PULMONARY THROMBECTOMY N/A 03/18/2022   Procedure: PULMONARY THROMBECTOMY;  Surgeon: Algernon Huxley, MD;  Location: [place] CV LAB;  Service: Cardiovascular;  Laterality: N/A;   PULMONARY THROMBECTOMY Bilateral 03/23/2022   Procedure: PULMONARY THROMBECTOMY;  Surgeon: Katha Cabal, MD;  Location: [place] [facility] LAB;  Service: Cardiovascular;  Laterality: Bilateral;   THROMBECTOMY ILIAC ARTERY Left 03/12/2022   Procedure: THROMBECTOMY LEG;  Surgeon: Conrad North Gates, MD;  Location: [facility];  Service: Vascular;  Laterality: Left;   TRANSURETHRAL RESECTION OF BLADDER NECK N/A 03/20/2015   Procedure: RELEASE BLADDER NECK CONTRACTURE  WITH WOLF BUTTON ELECTRODE ;  Surgeon: Carolan Clines, MD;  Location: [place];  Service: Urology;  Laterality: N/A;   TRANSURETHRAL RESECTION OF BLADDER TUMOR N/A 12/05/2018   Procedure: TRANSURETHRAL RESECTION OF BLADDER TUMOR (TURBT)/CYSTOSCOPY/  INSTILLATION OF Rodell Perna;  Surgeon: Ceasar Mons, MD;  Location: [facility];  Service: Urology;  Laterality: N/A;   TRANSURETHRAL RESECTION OF BLADDER TUMOR N/A 01/15/2020   Procedure: TRANSURETHRAL RESECTION OF BLADDER TUMOR (TURBT)/ CYSTOSCOPY;  Surgeon: Ceasar Mons, MD;  Location: [facility];  Service: Urology;  Laterality: N/A;   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     Patient Active Problem List    Diagnosis Date Noted   Iron deficiency anemia 06/20/2022   Myeloproliferative disorder ([place]) 05/19/2022   History of bladder cancer 05/19/2022   Goals of care, counseling/discussion 03/23/2022   Reactive thrombocytosis 03/21/2022   Paroxysmal atrial fibrillation with RVR ([place]) 03/20/2022   Acute bilateral deep vein thrombosis (DVT) of femoral veins (HCC) 03/20/2022   Acute respiratory failure with hypoxia (HCC) 03/16/2022   Acute on chronic diastolic CHF (congestive heart failure) ([place]) 03/16/2022   PSVT (paroxysmal supraventricular tachycardia) 03/16/2022   AKI (acute kidney injury) ([place]) 03/15/2022   Critical limb ischemia of left lower extremity with ulceration of lower leg ([place]) 03/15/2022   Arterial occlusion 03/11/2022   Elevated troponin    Unstable angina ([place]) 01/05/2021   Hyperlipidemia LDL goal <70    CKD (chronic kidney disease), stage III (HCC)    Coronary artery disease    NSTEMI (non-ST elevated myocardial infarction) ([place]) 12/23/2020   Acquired hypothyroidism 04/20/2020   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 pylori) infection 09/04/2017   Medicare annual wellness visit, initial 03/31/2017   History of prostate cancer 10/27/2014   Lower urinary tract symptoms (LUTS) 03/18/2012   Nephrolithiasis 03/18/2012    PCP: Rusty Aus, MD  REFERRING PROVIDER: Kris Hartmann, NP  REFERRING DIAG: Left below knee amputee  THERAPY DIAG:  Gait  difficulty  Muscle weakness (generalized)  Left below-knee amputee [facility])  Rationale for Evaluation and Treatment Rehabilitation  ONSET DATE: 03/16/22  SUBJECTIVE:   SUBJECTIVE STATEMENT:   EVALUATION Pt. S/p R BKA after blood clots in lower leg.  Pt. Reports minimum phantom limb symptoms and occasional muscle spasms.  Pt. Takes Baclofen to manage symptoms.  Pt. Received prosthetic leg on Wednesday from [facility].  Pt. Has h/o low back pain and has received spinal epidurals in past (2 years ago).    PERTINENT HISTORY: 1. Hx of BKA, left [facility]) Mr. Alfred Harrel was seen for further evaluation for left below knee prosthesis.  The is a 76 year old male who had amputation on 03/16/2022.  At the time he is well healed and ready for fitting of new below knee prosthesis.  He is a highly motivated individual and should do well once fitted with prosthesis.  She has no problem returning to a K2 ambulator, which will allow him to walk inside and outside of his home and overload level barriers.    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  He will be having a bone marrow biopsy.  We will continue to have the patient keep his IVC filter for now as there may be further test.  We will have the patient return in 3 months to discuss filter removal.    PAIN:  Are you having pain? No  PRECAUTIONS: None  WEIGHT BEARING RESTRICTIONS No  FALLS:  Has patient fallen in last 6 months? No  LIVING ENVIRONMENT: Lives with: lives with their spouse Lives in: House/apartment Stairs: Yes: Internal: 13+ steps; on right going up Has following equipment at home: Single point cane and Walker - 2 wheeled  OCCUPATION: Retired  PLOF: [place] with walking and return to playing golf by spring.     OBJECTIVE:   PATIENT SURVEYS:  FOTO initial 40/ goal 60.    COGNITION:  Overall cognitive status: Within functional limits for  tasks assessed     SENSATION: WFL  EDEMA:  Circumferential: L/R knee joint line (34/36 cm.), calf (30.5/ 33 cm), distal quad (35/36 cm).    POSTURE: rounded shoulders and forward head  PALPATION: No tenderness/ good incision healing on residual limb  LOWER EXTREMITY ROM:  B LE WFL (all planes).  L knee 0-130 deg.   LOWER EXTREMITY MMT:  R LE muscle strength grossly 5/5 MMT except hip flexion 4+/5 MMT.  L LE strength grossly 5/5 MMT except hip flexion 4/5 MMT and hip abduction 4+/5 MMT.    FUNCTIONAL TESTS:  5 times sit to stand: 14.1 sec. And requires R UE assist.  Able to stand from chair with addition of 2" Airex pad.    GAIT: Distance walked: in clinic/ //-bars Assistive device utilized: Gilford Rile -  2 wheeled Level of assistance: Min A Comments: Ambulates in clinic with RW and min. A/ cuing for proper technique and step pattern.  Pt. Able to ambulate in //-bars with R UE assist only and 2-point gait pattern.      TODAY'S TREATMENT:  08/01/22:  Subjective: Pt. Reports significant lower abdominal/ groin pain since last night and states pain has worsened this morning.    No treatment today.   PATIENT EDUCATION:  Education details: Reviewed HEP Person educated: Patient Education method: Explanation, Demonstration, and Handouts Education comprehension: verbalized understanding and returned demonstration   HOME EXERCISE PROGRAM: See handouts.   ASSESSMENT:  CLINICAL IMPRESSION: Pt. Not appropriate for tx. Today secondary to lower abdominal/ groin pain.  Pt. States he has severe pain with movement/ getting out of car to walk into PT clinic.  Pt. States pain started last night and is worse this morning.  Pt. Is planning to go to urgent care to have issues evaluated.  PT on hold today and pt. Instructed to contact PT prior to next tx. If symptoms persist.     OBJECTIVE IMPAIRMENTS Abnormal gait, decreased activity tolerance, decreased balance, decreased endurance,  decreased mobility, difficulty walking, decreased strength, impaired flexibility, improper body mechanics, postural dysfunction, and pain.   ACTIVITY LIMITATIONS carrying, lifting, bending, standing, squatting, stairs, transfers, toileting, dressing, and locomotion level  PARTICIPATION LIMITATIONS: driving, community activity, and yard work  PERSONAL FACTORS Fitness and Past/current experiences are also affecting patient's functional outcome.   REHAB POTENTIAL: Good  CLINICAL DECISION MAKING: Stable/uncomplicated  EVALUATION COMPLEXITY: Low   GOALS: Goals reviewed with patient? Yes  SHORT TERM GOALS: Target date: 08/10/22 Pt. Independent with HEP to increase B hip strength 1/2 muscle grade to improve standing/walking mod. Independence.   Baseline:  see above.  L hip flexion 4/5 MMT.  Goal status: Partially met    LONG TERM GOALS: Target date: 08/26/22  Pt. Will increase FOTO to 59 to improve functional mobility.   Baseline:  initial FOTO 40.  11/8: 68 Goal status: Goal met  2.  Pt. Able to ascend/descend 13 stairs with R handrail and consistent step pattern to improve pts. Ability to go to 2nd floor of home.  11/8: recip. Gait on stairs outside with R handrail for safety.   Baseline:  Goal status: Goal met  3.  Pt. Will ambulate with mod. I and SPC with consistent 2-point gait pattern and no loss of balance to improve independence at home/ community.   Baseline:  11/8: pt. Ambulates community distances Goal status: Goal met  4.  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    Baseline:  pt. Requires extra time/ SPC while walking outside and limited with carrying  5.  Pt. Able to return to hitting golf balls/ playing round of golf with no limitations safely.    Baseline:  pt. Demonstrates chipping golf balls in gym with SBA/CGA    PLAN: PT FREQUENCY: 2x/week  PT DURATION: 4 weeks  PLANNED INTERVENTIONS: Therapeutic  exercises, Therapeutic activity, Neuromuscular re-education, Balance training, Gait training, Patient/Family education, Self Care, Joint mobilization, Stair training, Prosthetic training, Manual therapy, and Re-evaluation  PLAN FOR NEXT SESSION: Progress gait with least assistive device.  Simulate golf swing  Pura Spice, PT, DPT # [phone] 08/01/2022, 11:42 AM

## 2022-08-01 NOTE — ED Triage Notes (Signed)
Pt to ED via POV stating that he started having pelvic pain last night. Pt states that the pain is so severe that he is having difficulty walking.Pt states that he had a similar episode of pain a few weeks ago but it was not as severe and did not last as long. PT was seen by his PCP at that time and they did not find anything.

## 2022-08-01 NOTE — Discharge Instructions (Addendum)
Take tylenol 1g every 8 hours.  I have discussed with the urology team who is going to try to get you an appointment in the next 1 to 2 days.  Return to the ER if you develop fevers, worsening pain.  Try to use some heating pads over the area to see if that helps.  IMPRESSION: 1. No acute intra-abdominal or intrapelvic process. 2. IVC filter. 3.  Aortic Atherosclerosis (ICD10-I70.0).

## 2022-08-02 ENCOUNTER — Telehealth: Payer: Self-pay | Admitting: Urology

## 2022-08-02 NOTE — Telephone Encounter (Signed)
Patient is scheduled for ER follow up appt tomorrow 11/15, for lower pelvic pain with Dr. Diamantina Providence. He is asking if he can take anything for pain in the meantime.

## 2022-08-02 NOTE — Telephone Encounter (Signed)
Spoke with patient and he scheduled appt and will see [place] tomorrow at 10:30

## 2022-08-03 ENCOUNTER — Ambulatory Visit (INDEPENDENT_AMBULATORY_CARE_PROVIDER_SITE_OTHER): Payer: Medicare HMO | Admitting: Urology

## 2022-08-03 DIAGNOSIS — R102 Pelvic and perineal pain: Secondary | ICD-10-CM

## 2022-08-03 NOTE — Patient Instructions (Signed)
Pelvic Floor Dysfunction, Male     Pelvic floor dysfunction (PFD) is a condition that results when the group of muscles and connective tissues that support the organs in the pelvis (pelvic floor muscles) do not work well. These muscles and their connections form a sling that supports the colon and bladder. In men, these muscles also support the prostate gland. PFD causes pelvic floor muscles to be too weak, too tight, or both. In PFD, muscle movements are not coordinated. This may cause bowel or bladder problems. It may also cause pain. What are the causes? This condition may be caused by an injury to the pelvic area or by a weakening of pelvic muscles. In many cases, the exact cause is not known. What increases the risk? The following factors may make you more likely to develop PFD: Having chronic bladder tissue inflammation (interstitial cystitis). Being an older person. Being overweight. History of radiation treatment for cancer in the pelvic region. Previous pelvic surgery, such as removal of the prostate gland (prostatectomy). What are the signs or symptoms? Symptoms of this condition vary and may include: Bladder symptoms, such as: Trouble starting urination and emptying the bladder. Frequent urinary tract infections. Leaking urine when coughing, laughing, or exercising (stress incontinence). Having to pass urine urgently or frequently. Pain when passing urine. Bowel symptoms, such as: Constipation. Urgent or frequent bowel movements. Incomplete bowel movements. Painful bowel movements. Leaking stool or gas. Unexplained genital or rectal pain. Genital or rectal muscle spasms. Low back pain. Sexual dysfunction, such as erectile dysfunction, premature ejaculation, or pain during or after sexual activity. How is this diagnosed? This condition is diagnosed based on: Your symptoms and medical history. A physical exam. During the exam, your health care provider may check your  pelvic muscles for tightness, spasm, pain, or weakness. This may include a rectal exam. In some cases, you may have diagnostic tests, such as: Electrical muscle function tests. Urine flow testing. X-ray tests of bowel function. Ultrasound of the pelvic organs. How is this treated? Treatment for this condition depends on your symptoms. Treatment options include: Physical therapy. This may include Kegel exercises to help relax or strengthen the pelvic floor muscles. Biofeedback. This type of therapy provides feedback on how tight your pelvic floor muscles are so that you can learn to control them. Massage therapy. A treatment that involves electrical stimulation of the pelvic floor muscles to help control pain (transcutaneous electrical nerve stimulation, or TENS). Sound wave therapy (ultrasound) to reduce muscle spasms. Medicines, such as: Muscle relaxants. Bladder control medicines. Surgery to reconstruct or support pelvic floor muscles may be an option if other treatments do not help. Follow these instructions at home: Activity Do your usual activities as told by your health care provider. Ask your health care provider if you should modify any activities. Do pelvic floor strengthening or relaxing exercises at home as told by your physical therapist. Lifestyle Maintain a healthy weight. Eat foods that are high in fiber, such as beans, whole grains, and fresh fruits and vegetables. Limit foods that are high in fat and processed sugars, such as fried or sweet foods. Manage stress with relaxation techniques such as yoga or meditation. General instructions If you have problems with leakage: Use absorbable pads or wear padded underwear. Wash your genital and anal area frequently with mild soap. Keep your genital and anal area as clean and dry as possible. Ask your health care provider if you should try a barrier cream to prevent skin irritation. Take warm baths   to relieve pelvic muscle  tension or spasms. Take over-the-counter and prescription medicines only as told by your health care provider. Keep all follow-up visits. How is this prevented? The cause of PFD is not always known, but there are a few things you can do to reduce the risk of developing this condition, including: Staying at a healthy weight. Getting regular exercise. Managing stress. Contact a health care provider if: Your symptoms are not improving with home care. You have signs or symptoms of PFD that get worse. You develop new signs or symptoms. You have signs of a urinary tract infection, such as: Fever. Chills. Increased urinary frequency. A burning feeling when urinating. You have not had a bowel movement in 3 days (constipation). Summary Pelvic floor dysfunction results when the muscles and connective tissues in your pelvic floor do not work well. These muscles and their connections form a sling that supports your colon and bladder. In men, these muscles also support the prostate gland. PFD may be caused by an injury to the pelvic area or by a weakening of pelvic muscles. PFD causes pelvic floor muscles to be too weak, too tight, or a combination of both. Symptoms may vary from person to person. In most cases, PFD can be treated with physical therapies and medicines. Surgery may be an option if other treatments do not help. This information is not intended to replace advice given to you by your health care provider. Make sure you discuss any questions you have with your health care provider. Document Revised: 01/13/2021 Document Reviewed: 01/13/2021 Elsevier Patient Education  2023 Elsevier Inc.    

## 2022-08-03 NOTE — Progress Notes (Signed)
   08/03/2022 11:38 AM   Shelia Media Aug 14, 1946 [id number]  Reason for visit: Pelvic pain  HPI: 76 year old male who I just recently met for the first time on 07/28/2022 as a new patient-briefly his urologic history is notable for prostate cancer treated with cryoablation complicated by ED requiring a penile prosthesis placed by Dr. Gaynelle Arabian, history of LG NMIBC bladder cancer and bladder neck contracture, with artificial sphincter placement in June 5427 at [facility](complicated by pulmonary embolus and extensive hospitalization including left lower extremity amputation), as well as low testosterone previously on testosterone replacement through Dr. Gilford Rile.  He has been off the testosterone since his complex hospitalization in June 2023.  He required multiple vascular surgeries at that time.   When I saw him on 11/9 he was doing well.  He presented to the ER on 08/01/2022 with low pelvic pain of unclear etiology.  Work-up was completely benign with normal labs, normal urinalysis, normal CT abdomen and pelvis with contrast.  I personally viewed and interpreted those images, and no urologic abnormalities noted.  He feels much better and just reports some mild soreness today but no pain similar to when he was in the ER.  We reviewed possible etiologies including idiopathic, chronic pain or pelvic floor dysfunction.  His symptoms are more consistent with pelvic floor dysfunction, especially in the setting of new lower extremity amputation with new gait pattern, as well as artificial sphincter from June 2023.  Behavioral strategies and stretches provided regarding pelvic floor dysfunction Consider referral to pelvic floor physical therapy if persistent symptoms  Billey Co, MD  [facility] [address] [place], [place] [address] [phone]

## 2022-08-04 ENCOUNTER — Ambulatory Visit: Payer: Medicare HMO | Admitting: Physical Therapy

## 2022-08-05 ENCOUNTER — Telehealth: Payer: Self-pay

## 2022-08-05 NOTE — Telephone Encounter (Signed)
     Patient  visit on 11/13  at [facility]   Have you been able to follow up with your primary care physician? Yes   The patient was or was not able to obtain any needed medicine or equipment. Yes   Are there diet recommendations that you are having difficulty following? Na   Patient expresses understanding of discharge instructions and education provided has no other needs at this time.  Yes      [place], [facility], Care Management  [phone] [address], [place], [place] [address] Phone: [phone] Email: [email or web address]

## 2022-08-08 DIAGNOSIS — D469 Myelodysplastic syndrome, unspecified: Secondary | ICD-10-CM | POA: Diagnosis not present

## 2022-08-08 DIAGNOSIS — M5418 Radiculopathy, sacral and sacrococcygeal region: Secondary | ICD-10-CM | POA: Diagnosis not present

## 2022-08-08 DIAGNOSIS — Z89512 Acquired absence of left leg below knee: Secondary | ICD-10-CM | POA: Diagnosis not present

## 2022-08-09 ENCOUNTER — Ambulatory Visit: Payer: Medicare HMO | Admitting: Physical Therapy

## 2022-08-09 ENCOUNTER — Encounter: Payer: Self-pay | Admitting: Physical Therapy

## 2022-08-09 DIAGNOSIS — Z89512 Acquired absence of left leg below knee: Secondary | ICD-10-CM

## 2022-08-09 DIAGNOSIS — M6281 Muscle weakness (generalized): Secondary | ICD-10-CM | POA: Diagnosis not present

## 2022-08-09 DIAGNOSIS — R269 Unspecified abnormalities of gait and mobility: Secondary | ICD-10-CM

## 2022-08-09 NOTE — Therapy (Signed)
OUTPATIENT PHYSICAL THERAPY LOWER EXTREMITY TREATMENT Physical Therapy Progress Note   Dates of reporting period  06/24/22  to  08/09/22  Patient Name: JONATHANDAVID [name] MRN: [id number] DOB:[birth year], 76 y.o., male Today's Date: 08/09/2022   PT End of Session - 08/09/22 0730     Visit Number 10    Number of Visits 17    Date for PT Re-Evaluation 08/26/22    PT Start Time 0728    PT Stop Time 0821    PT Time Calculation (min) 53 min             Past Medical History:  Diagnosis Date   Arthritis    Basal cell carcinoma    L clavicle, L prox forearm- removed years ago    Bladder cancer [facility])    Bladder neck contracture    CAD (coronary artery disease)    a. 12/2020 NSTEMI/Cath: LM nl, LAD 95ost/p, LCX 50p, RCA nl. EF 45-50%.   Cataract    CKD (chronic kidney disease), stage III [facility])    ED (erectile dysfunction)    Frequency    GERD (gastroesophageal reflux disease)    History of pulmonary embolus (PE) 11/2018   a. Following LE DVT-->Chronic warfarin.   Hyperlipidemia LDL goal <70    Hypertension    Hypothyroidism    Incontinence of urine    sui, s/p cryoablation   Ischemic cardiomyopathy    a. 11/2018 Echo: EF 60-65%; b. 12/2020 LV Gram: EF 45-50% in setting of NSTEMI.   Kidney stones    Neuropathy    feet   Nocturia    Prostate cancer ([place])    S/P   CRYOABLATION   Right elbow tendinitis    Right Lower Extremity DVT (deep venous thrombosis) ([place]) 11/25/2018   Vertigo    1-2x/yr   Wears glasses    Past Surgical History:  Procedure Laterality Date   AMPUTATION Left 03/16/2022   Procedure: AMPUTATION BELOW KNEE;  Surgeon: Katha Cabal, MD;  Location: [facility] ORS;  Service: Vascular;  Laterality: Left;   ARTHROPLASTY AND TENDON REPAIR LEFT THUMB  JAN 2014   CATARACT EXTRACTION W/PHACO Left 11/16/2020   Procedure: CATARACT EXTRACTION PHACO AND INTRAOCULAR LENS PLACEMENT (IOC) LEFT  7.09 00:56.4 12.6%;  Surgeon: Leandrew Koyanagi, MD;  Location: [place];  Service: Ophthalmology;  Laterality: Left;   CATARACT EXTRACTION W/PHACO Right 12/02/2020   Procedure: CATARACT EXTRACTION PHACO AND INTRAOCULAR LENS PLACEMENT (IOC) RIGHT MALYUGIN 5.52 00:49.7 11.1%;  Surgeon: Leandrew Koyanagi, MD;  Location: [place];  Service: Ophthalmology;  Laterality: Right;   COLONOSCOPY     COLONOSCOPY WITH PROPOFOL N/A 08/09/2017   Procedure: COLONOSCOPY WITH PROPOFOL;  Surgeon: Manya Silvas, MD;  Location: [facility] ENDOSCOPY;  Service: Endoscopy;  Laterality: N/A;   CORONARY ANGIOGRAPHY N/A 12/28/2020   Procedure: CORONARY ANGIOGRAPHY;  Surgeon: Sherren Mocha, MD;  Location: [place] CV LAB;  Service: Cardiovascular;  Laterality: N/A;   CORONARY STENT INTERVENTION N/A 12/28/2020   Procedure: CORONARY STENT INTERVENTION;  Surgeon: Sherren Mocha, MD;  Location: [place] CV LAB;  Service: Cardiovascular;  Laterality: N/A;   ESOPHAGOGASTRODUODENOSCOPY (EGD) WITH PROPOFOL N/A 08/09/2017   Procedure: ESOPHAGOGASTRODUODENOSCOPY (EGD) WITH PROPOFOL;  Surgeon: Manya Silvas, MD;  Location: [facility]COPY;  Service: Endoscopy;  Laterality: N/A;   GREEN LIGHT LASER TURP (TRANSURETHRAL RESECTION OF PROSTATE N/A 12/14/2015   Procedure: GREEN LIGHT LASER TURP (TRANSURETHRAL RESECTION OF PROSTATE) LASER OF BLADDER NECK CONTRACTURE;  Surgeon: Carolan Clines, MD;  Location: [place];  Service: Urology;  Laterality: N/A;   IVC FILTER INSERTION N/A 03/19/2022   Procedure: IVC FILTER INSERTION;  Surgeon: Serafina Mitchell, MD;  Location: [place] CV LAB;  Service: Cardiovascular;  Laterality: N/A;   LEFT HEART CATH AND CORONARY ANGIOGRAPHY N/A 12/24/2020   Procedure: LEFT HEART CATH AND CORONARY ANGIOGRAPHY and possible PCI and stent;  Surgeon: Yolonda Kida, MD;  Location: [place] [facility] LAB;  Service: Cardiovascular;  Laterality: N/A;   LEFT HEART CATH AND CORONARY ANGIOGRAPHY N/A 01/06/2021   Procedure: LEFT HEART CATH  AND CORONARY ANGIOGRAPHY;  Surgeon: Troy Sine, MD;  Location: [place] [facility] LAB;  Service: Cardiovascular;  Laterality: N/A;   LOWER EXTREMITY ANGIOGRAPHY Left 03/11/2022   Procedure: Lower Extremity Angiography;  Surgeon: Katha Cabal, MD;  Location: [place] [facility] LAB;  Service: Cardiovascular;  Laterality: Left;   PENILE PROSTHESIS IMPLANT N/A 12/06/2013   Procedure: PENILE PROTHESIS INFLATABLE;  Surgeon: Ailene Rud, MD;  Location: [facility];  Service: Urology;  Laterality: N/A;   PROSTATE CRYOABLATION  06-01-2012  [place]   PULMONARY THROMBECTOMY N/A 03/18/2022   Procedure: PULMONARY THROMBECTOMY;  Surgeon: Algernon Huxley, MD;  Location: [place] CV LAB;  Service: Cardiovascular;  Laterality: N/A;   PULMONARY THROMBECTOMY Bilateral 03/23/2022   Procedure: PULMONARY THROMBECTOMY;  Surgeon: Katha Cabal, MD;  Location: [place] [facility] LAB;  Service: Cardiovascular;  Laterality: Bilateral;   THROMBECTOMY ILIAC ARTERY Left 03/12/2022   Procedure: THROMBECTOMY LEG;  Surgeon: Conrad Lewisburg, MD;  Location: [facility]S;  Service: Vascular;  Laterality: Left;   TRANSURETHRAL RESECTION OF BLADDER NECK N/A 03/20/2015   Procedure: RELEASE BLADDER NECK CONTRACTURE  WITH WOLF BUTTON ELECTRODE ;  Surgeon: Carolan Clines, MD;  Location: [place];  Service: Urology;  Laterality: N/A;   TRANSURETHRAL RESECTION OF BLADDER TUMOR N/A 12/05/2018   Procedure: TRANSURETHRAL RESECTION OF BLADDER TUMOR (TURBT)/CYSTOSCOPY/  INSTILLATION OF Rodell Perna;  Surgeon: Ceasar Mons, MD;  Location: [facility];  Service: Urology;  Laterality: N/A;   TRANSURETHRAL RESECTION OF BLADDER TUMOR N/A 01/15/2020   Procedure: TRANSURETHRAL RESECTION OF BLADDER TUMOR (TURBT)/ CYSTOSCOPY;  Surgeon: Ceasar Mons, MD;  Location: [facility];  Service: Urology;  Laterality: N/A;   TRIGGER FINGER RELEASE Left 10/2015   ulner  nerve neuropathy     Patient Active Problem List   Diagnosis Date Noted   Iron deficiency anemia 06/20/2022   Myeloproliferative disorder ([place]) 05/19/2022   History of bladder cancer 05/19/2022   Goals of care, counseling/discussion 03/23/2022   Reactive thrombocytosis 03/21/2022   Paroxysmal atrial fibrillation with RVR ([place]) 03/20/2022   Acute bilateral deep vein thrombosis (DVT) of femoral veins (HCC) 03/20/2022   Acute respiratory failure with hypoxia (HCC) 03/16/2022   Acute on chronic diastolic CHF (congestive heart failure) ([place]) 03/16/2022   PSVT (paroxysmal supraventricular tachycardia) 03/16/2022   AKI (acute kidney injury) ([place]) 03/15/2022   Critical limb ischemia of left lower extremity with ulceration of lower leg ([place]) 03/15/2022   Arterial occlusion 03/11/2022   Elevated troponin    Unstable angina ([place]) 01/05/2021   Hyperlipidemia LDL goal <70    CKD (chronic kidney disease), stage III (HCC)    Coronary artery disease    NSTEMI (non-ST elevated myocardial infarction) ([place]) 12/23/2020   Acquired hypothyroidism 04/20/2020   Chemotherapy-induced neuropathy ([place]) 04/20/2020   Nonthrombocytopenic purpura ([place]) 12/03/2019   Hypercoagulable state ([place]) 12/10/2018   Recurrent deep vein thrombosis (  DVT) ([place])    Bilateral pulmonary embolism ([place]) 12/07/2018   GERD (gastroesophageal reflux disease) 12/07/2018   HTN (hypertension) 12/07/2018   HLD (hyperlipidemia) 12/07/2018   Prostate cancer ([place]) 12/07/2018   Bladder cancer ([place]) 12/07/2018   DM type 2 with diabetic mixed hyperlipidemia ([place]) 04/04/2018   Pain in right hand 10/04/2017   Tubular adenoma 79/10/4095   Helicobacter pylori (H. pylori) infection 09/04/2017   Medicare annual wellness visit, initial 03/31/2017   History of prostate cancer 10/27/2014   Lower urinary tract symptoms (LUTS) 03/18/2012   Nephrolithiasis 03/18/2012    PCP: Rusty Aus, MD  REFERRING PROVIDER: Kris Hartmann, NP  REFERRING  DIAG: Left below knee amputee  THERAPY DIAG:  Gait difficulty  Muscle weakness (generalized)  Left below-knee amputee [facility])  Rationale for Evaluation and Treatment Rehabilitation  ONSET DATE: 03/16/22  SUBJECTIVE:   SUBJECTIVE STATEMENT:   EVALUATION Pt. S/p R BKA after blood clots in lower leg.  Pt. Reports minimum phantom limb symptoms and occasional muscle spasms.  Pt. Takes Baclofen to manage symptoms.  Pt. Received prosthetic leg on Wednesday from [facility].  Pt. Has h/o low back pain and has received spinal epidurals in past (2 years ago).    PERTINENT HISTORY: 1. Hx of BKA, left [facility]) Mr. Jayden Kratochvil was seen for further evaluation for left below knee prosthesis.  The is a 76 year old male who had amputation on 03/16/2022.  At the time he is well healed and ready for fitting of new below knee prosthesis.  He is a highly motivated individual and should do well once fitted with prosthesis.  She has no problem returning to a K2 ambulator, which will allow him to walk inside and outside of his home and overload level barriers.    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  He will be having a bone marrow biopsy.  We will continue to have the patient keep his IVC filter for now as there may be further test.  We will have the patient return in 3 months to discuss filter removal.    PAIN:  Are you having pain? No  PRECAUTIONS: None  WEIGHT BEARING RESTRICTIONS No  FALLS:  Has patient fallen in last 6 months? No  LIVING ENVIRONMENT: Lives with: lives with their spouse Lives in: House/apartment Stairs: Yes: Internal: 13+ steps; on right going up Has following equipment at home: Single point cane and Walker - 2 wheeled  OCCUPATION: Retired  PLOF: [place] with walking and return to playing golf by spring.     OBJECTIVE:   PATIENT SURVEYS:  FOTO initial 40/ goal 57.     COGNITION:  Overall cognitive status: Within functional limits for tasks assessed     SENSATION: WFL  EDEMA:  Circumferential: L/R knee joint line (34/36 cm.), calf (30.5/ 33 cm), distal quad (35/36 cm).    POSTURE: rounded shoulders and forward head  PALPATION: No tenderness/ good incision healing on residual limb  LOWER EXTREMITY ROM:  B LE WFL (all planes).  L knee 0-130 deg.   LOWER EXTREMITY MMT:  R LE muscle strength grossly 5/5 MMT except hip flexion 4+/5 MMT.  L LE strength grossly 5/5 MMT except hip flexion 4/5 MMT and hip abduction 4+/5 MMT.    FUNCTIONAL TESTS:  5 times sit to stand: 14.1 sec. And requires R UE assist.  Able to stand from chair with addition of 2" Airex pad.  GAIT: Distance walked: in clinic/ //-bars Assistive device utilized: Environmental consultant - 2 wheeled Level of assistance: Min A Comments: Ambulates in clinic with RW and min. A/ cuing for proper technique and step pattern.  Pt. Able to ambulate in //-bars with R UE assist only and 2-point gait pattern.      TODAY'S TREATMENT:  08/09/22:  Subjective: Pt. States lower abdominal pain is better today and pt. Saw PCP yesterday.  Pt. Stated Dr. Sabra Heck says pt. Has SI radiculitis which was causing lower abdominal/groin pain.  MD recommends SI injection if pain returns.  Pt. Went to steak house Saturday and had to walk across a bridge/ climb steep stairs.     Gait training:   Walking in clinic/ gym with no assistive device and working on recip. Pattern/ heel strike.  Walking in hallway with increase cadence.    //-bars: forward/backwards/lateral walking with light to no UE assist 4 laps each.  Mirror feedback with increase step length (toe or heel strike).  Walking alt. UE/LE touches in //-bars with cuing to correct upright posture.    Stairs: reciprocal pattern with R UE assist on handrails.  Pt. Attempted to complete without UE assist but unable to complete safely.  4 steps x 3.     No outside  walking today secondary to rainy weather.     There.ex.:    Nustep L5 10 min. B LE only (consistent cadence).  Discussed weekend activities.     Resisted gait 2BTB 5x all 4-planes with light UE assist and mirror feedback.  Balance challenged, esp. With lateral walking.   Agility ladder: L/R step in/touches with no UE assist.  Increase step length with blue line feedback 2x each.  Marked fatigue during agility ladder tasks.  No increase c/o pain.       PATIENT EDUCATION:  Education details: Reviewed HEP Person educated: Patient Education method: Explanation, Demonstration, and Handouts Education comprehension: verbalized understanding and returned demonstration   HOME EXERCISE PROGRAM: See handouts.   ASSESSMENT:  CLINICAL IMPRESSION: Patient is highly motivated and hard working during standing/ walking tasks today.  Pt. Walking more consistently around PT clinic without use of SPC and recip. Gait pattern.  No LOB during tx. But moderate fatigue with agility ladder tasks.   Pt. Progressing to gait without UE assist and good balance.  No c/o back/lower abdominal pain today as compared to last week.  Pt. Will benefit from skilled PT services to increase LE muscle strength to improve independence with walking/ return to playing golf.       OBJECTIVE IMPAIRMENTS Abnormal gait, decreased activity tolerance, decreased balance, decreased endurance, decreased mobility, difficulty walking, decreased strength, impaired flexibility, improper body mechanics, postural dysfunction, and pain.   ACTIVITY LIMITATIONS carrying, lifting, bending, standing, squatting, stairs, transfers, toileting, dressing, and locomotion level  PARTICIPATION LIMITATIONS: driving, community activity, and yard work  PERSONAL FACTORS Fitness and Past/current experiences are also affecting patient's functional outcome.   REHAB POTENTIAL: Good  CLINICAL DECISION MAKING: Stable/uncomplicated  EVALUATION COMPLEXITY:  Low   GOALS: Goals reviewed with patient? Yes  SHORT TERM GOALS: Target date: 08/10/22 Pt. Independent with HEP to increase B hip strength 1/2 muscle grade to improve standing/walking mod. Independence.   Baseline:  see above.  L hip flexion 4/5 MMT.  Goal status: Partially met    LONG TERM GOALS: Target date: 08/26/22  Pt. Will increase FOTO to 59 to improve functional mobility.   Baseline:  initial FOTO 40.  11/8: 68 Goal status: Goal  met  2.  Pt. Able to ascend/descend 13 stairs with R handrail and consistent step pattern to improve pts. Ability to go to 2nd floor of home.  11/8: recip. Gait on stairs outside with R handrail for safety.   Baseline:  Goal status: Goal met  3.  Pt. Will ambulate with mod. I and SPC with consistent 2-point gait pattern and no loss of balance to improve independence at home/ community.   Baseline:  11/8: pt. Ambulates community distances Goal status: Goal met  4.  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    Baseline:  pt. Requires extra time/ SPC while walking outside and limited with carrying  5.  Pt. Able to return to hitting golf balls/ playing round of golf with no limitations safely.    Baseline:  pt. Demonstrates chipping golf balls in gym with SBA/CGA    PLAN: PT FREQUENCY: 2x/week  PT DURATION: 4 weeks  PLANNED INTERVENTIONS: Therapeutic exercises, Therapeutic activity, Neuromuscular re-education, Balance training, Gait training, Patient/Family education, Self Care, Joint mobilization, Stair training, Prosthetic training, Manual therapy, and Re-evaluation  PLAN FOR NEXT SESSION: Progress gait with least assistive device.  Simulate golf swing  Pura Spice, PT, DPT # [phone] 08/09/2022, 8:45 AM

## 2022-08-15 ENCOUNTER — Ambulatory Visit: Payer: Medicare HMO | Admitting: Physical Therapy

## 2022-08-15 DIAGNOSIS — R269 Unspecified abnormalities of gait and mobility: Secondary | ICD-10-CM

## 2022-08-15 DIAGNOSIS — Z89512 Acquired absence of left leg below knee: Secondary | ICD-10-CM

## 2022-08-15 DIAGNOSIS — M6281 Muscle weakness (generalized): Secondary | ICD-10-CM

## 2022-08-15 NOTE — Therapy (Addendum)
OUTPATIENT PHYSICAL THERAPY LOWER EXTREMITY TREATMENT  Patient Name: Phillip [name] MRN: [id number] DOB:[birth year], 76 y.o., male Today's Date: 08/15/2022   PT End of Session - 08/15/22 0728     Visit Number 11    Number of Visits 17    Date for PT Re-Evaluation 08/26/22    PT Start Time 0728    PT Stop Time 0816    PT Time Calculation (min) 48 min             Past Medical History:  Diagnosis Date   Arthritis    Basal cell carcinoma    L clavicle, L prox forearm- removed years ago    Bladder cancer [facility])    Bladder neck contracture    CAD (coronary artery disease)    a. 12/2020 NSTEMI/Cath: LM nl, LAD 95ost/p, LCX 50p, RCA nl. EF 45-50%.   Cataract    CKD (chronic kidney disease), stage III [facility])    ED (erectile dysfunction)    Frequency    GERD (gastroesophageal reflux disease)    History of pulmonary embolus (PE) 11/2018   a. Following LE DVT-->Chronic warfarin.   Hyperlipidemia LDL goal <70    Hypertension    Hypothyroidism    Incontinence of urine    sui, s/p cryoablation   Ischemic cardiomyopathy    a. 11/2018 Echo: EF 60-65%; b. 12/2020 LV Gram: EF 45-50% in setting of NSTEMI.   Kidney stones    Neuropathy    feet   Nocturia    Prostate cancer ([place])    S/P   CRYOABLATION   Right elbow tendinitis    Right Lower Extremity DVT (deep venous thrombosis) ([place]) 11/25/2018   Vertigo    1-2x/yr   Wears glasses    Past Surgical History:  Procedure Laterality Date   AMPUTATION Left 03/16/2022   Procedure: AMPUTATION BELOW KNEE;  Surgeon: Katha Cabal, MD;  Location: [facility] ORS;  Service: Vascular;  Laterality: Left;   ARTHROPLASTY AND TENDON REPAIR LEFT THUMB  JAN 2014   CATARACT EXTRACTION W/PHACO Left 11/16/2020   Procedure: CATARACT EXTRACTION PHACO AND INTRAOCULAR LENS PLACEMENT (IOC) LEFT  7.09 00:56.4 12.6%;  Surgeon: Leandrew Koyanagi, MD;  Location: [place];  Service: Ophthalmology;  Laterality: Left;   CATARACT EXTRACTION W/PHACO  Right 12/02/2020   Procedure: CATARACT EXTRACTION PHACO AND INTRAOCULAR LENS PLACEMENT (IOC) RIGHT MALYUGIN 5.52 00:49.7 11.1%;  Surgeon: Leandrew Koyanagi, MD;  Location: [place];  Service: Ophthalmology;  Laterality: Right;   COLONOSCOPY     COLONOSCOPY WITH PROPOFOL N/A 08/09/2017   Procedure: COLONOSCOPY WITH PROPOFOL;  Surgeon: Manya Silvas, MD;  Location: [facility] ENDOSCOPY;  Service: Endoscopy;  Laterality: N/A;   CORONARY ANGIOGRAPHY N/A 12/28/2020   Procedure: CORONARY ANGIOGRAPHY;  Surgeon: Sherren Mocha, MD;  Location: [place] CV LAB;  Service: Cardiovascular;  Laterality: N/A;   CORONARY STENT INTERVENTION N/A 12/28/2020   Procedure: CORONARY STENT INTERVENTION;  Surgeon: Sherren Mocha, MD;  Location: [place] CV LAB;  Service: Cardiovascular;  Laterality: N/A;   ESOPHAGOGASTRODUODENOSCOPY (EGD) WITH PROPOFOL N/A 08/09/2017   Procedure: ESOPHAGOGASTRODUODENOSCOPY (EGD) WITH PROPOFOL;  Surgeon: Manya Silvas, MD;  Location: [facility] ENDOSCOPY;  Service: Endoscopy;  Laterality: N/A;   GREEN LIGHT LASER TURP (TRANSURETHRAL RESECTION OF PROSTATE N/A 12/14/2015   Procedure: GREEN LIGHT LASER TURP (TRANSURETHRAL RESECTION OF PROSTATE) LASER OF BLADDER NECK CONTRACTURE;  Surgeon: Carolan Clines, MD;  Location: [place];  Service: Urology;  Laterality: N/A;   IVC FILTER INSERTION N/A 03/19/2022  Procedure: IVC FILTER INSERTION;  Surgeon: Serafina Mitchell, MD;  Location: [place] CV LAB;  Service: Cardiovascular;  Laterality: N/A;   LEFT HEART CATH AND CORONARY ANGIOGRAPHY N/A 12/24/2020   Procedure: LEFT HEART CATH AND CORONARY ANGIOGRAPHY and possible PCI and stent;  Surgeon: Yolonda Kida, MD;  Location: [place] CV LAB;  Service: Cardiovascular;  Laterality: N/A;   LEFT HEART CATH AND CORONARY ANGIOGRAPHY N/A 01/06/2021   Procedure: LEFT HEART CATH AND CORONARY ANGIOGRAPHY;  Surgeon: Troy Sine, MD;  Location: [place] CV LAB;   Service: Cardiovascular;  Laterality: N/A;   LOWER EXTREMITY ANGIOGRAPHY Left 03/11/2022   Procedure: Lower Extremity Angiography;  Surgeon: Katha Cabal, MD;  Location: [place] CV LAB;  Service: Cardiovascular;  Laterality: Left;   PENILE PROSTHESIS IMPLANT N/A 12/06/2013   Procedure: PENILE PROTHESIS INFLATABLE;  Surgeon: Ailene Rud, MD;  [facility]  Service: Urology;  Laterality: N/A;   PROSTATE CRYOABLATION  06-01-2012  [facility]   PULMONARY THROMBECTOMY N/A 03/18/2022   Procedure: PULMONARY THROMBECTOMY;  Surgeon: Algernon Huxley, MD;  Location: [place] CV LAB;  Service: Cardiovascular;  Laterality: N/A;   PULMONARY THROMBECTOMY Bilateral 03/23/2022   Procedure: PULMONARY THROMBECTOMY;  Surgeon: Katha Cabal, MD;  Location: [place] CV LAB;  Service: Cardiovascular;  Laterality: Bilateral;   THROMBECTOMY ILIAC ARTERY Left 03/12/2022   Procedure: THROMBECTOMY LEG;  Surgeon: Conrad Seymour, MD;  Location: [facility];  Service: Vascular;  Laterality: Left;   TRANSURETHRAL RESECTION OF BLADDER NECK N/A 03/20/2015   Procedure: RELEASE BLADDER NECK CONTRACTURE  WITH WOLF BUTTON ELECTRODE ;  Surgeon: Carolan Clines, MD;  Location: [place];  Service: Urology;  Laterality: N/A;   TRANSURETHRAL RESECTION OF BLADDER TUMOR N/A 12/05/2018   Procedure: TRANSURETHRAL RESECTION OF BLADDER TUMOR (TURBT)/CYSTOSCOPY/  INSTILLATION OF Rodell Perna;  Surgeon: Ceasar Mons, MD;  Location[facility];  Service: Urology;  Laterality: N/A;   TRANSURETHRAL RESECTION OF BLADDER TUMOR N/A 01/15/2020   Procedure: TRANSURETHRAL RESECTION OF BLADDER TUMOR (TURBT)/ CYSTOSCOPY;  Surgeon: Ceasar Mons, MD;  Location: [facility];  Service: Urology;  Laterality: N/A;   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     Patient Active Problem List   Diagnosis Date Noted   Iron  deficiency anemia 06/20/2022   Myeloproliferative disorder ([place]) 05/19/2022   History of bladder cancer 05/19/2022   Goals of care, counseling/discussion 03/23/2022   Reactive thrombocytosis 03/21/2022   Paroxysmal atrial fibrillation with RVR ([place]) 03/20/2022   Acute bilateral deep vein thrombosis (DVT) of femoral veins (HCC) 03/20/2022   Acute respiratory failure with hypoxia (HCC) 03/16/2022   Acute on chronic diastolic CHF (congestive heart failure) ([place]) 03/16/2022   PSVT (paroxysmal supraventricular tachycardia) 03/16/2022   AKI (acute kidney injury) ([place]) 03/15/2022   Critical limb ischemia of left lower extremity with ulceration of lower leg ([place]) 03/15/2022   Arterial occlusion 03/11/2022   Elevated troponin    Unstable angina ([place]) 01/05/2021   Hyperlipidemia LDL goal <70    CKD (chronic kidney disease), stage III (HCC)    Coronary artery disease    NSTEMI (non-ST elevated myocardial infarction) ([place]) 12/23/2020   Acquired hypothyroidism 04/20/2020   Chemotherapy-induced neuropathy ([place]) 04/20/2020   Nonthrombocytopenic purpura ([place]) 12/03/2019   Hypercoagulable state ([place]) 12/10/2018   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 pylori) infection 09/04/2017   Medicare annual wellness visit, initial 03/31/2017   History of prostate cancer 10/27/2014   Lower urinary tract symptoms (LUTS) 03/18/2012   Nephrolithiasis 03/18/2012    PCP: Rusty Aus, MD  REFERRING PROVIDER: Kris Hartmann, NP  REFERRING DIAG: Left below knee amputee  THERAPY DIAG:  Gait difficulty  Muscle  weakness (generalized)  Left below-knee amputee [facility])  Rationale for Evaluation and Treatment Rehabilitation  ONSET DATE: 03/16/22  SUBJECTIVE:   SUBJECTIVE STATEMENT:   EVALUATION Pt. S/p R BKA after blood clots in lower leg.  Pt. Reports minimum phantom limb symptoms and occasional muscle spasms.  Pt. Takes Baclofen to manage symptoms.  Pt. Received prosthetic leg on Wednesday from [facility].  Pt. Has h/o low back pain and has received spinal epidurals in past (2 years ago).    PERTINENT HISTORY: 1. Hx of BKA, left [facility]) Mr. Cadan Maggart was seen for further evaluation for left below knee prosthesis.  The is a 76 year old male who had amputation on 03/16/2022.  At the time he is well healed and ready for fitting of new below knee prosthesis.  He is a highly motivated individual and should do well once fitted with prosthesis.  She has no problem returning to a K2 ambulator, which will allow him to walk inside and outside of his home and overload level barriers.    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  He will be having a bone marrow biopsy.  We will continue to have the patient keep his IVC filter for now as there may be further test.  We will have the patient return in 3 months to discuss filter removal.    PAIN:  Are you having pain? No  PRECAUTIONS: None  WEIGHT BEARING RESTRICTIONS No  FALLS:  Has patient fallen in last 6 months? No  LIVING ENVIRONMENT: Lives with: lives with their spouse Lives in: House/apartment Stairs: Yes: Internal: 13+ steps; on right going up Has following equipment at home: Single point cane and Walker - 2 wheeled  OCCUPATION: Retired  PLOF: [place] with walking and return to playing golf by spring.     OBJECTIVE:   PATIENT SURVEYS:  FOTO initial 40/ goal 71.    COGNITION:  Overall cognitive status: Within functional limits for tasks  assessed     SENSATION: WFL  EDEMA:  Circumferential: L/R knee joint line (34/36 cm.), calf (30.5/ 33 cm), distal quad (35/36 cm).    POSTURE: rounded shoulders and forward head  PALPATION: No tenderness/ good incision healing on residual limb  LOWER EXTREMITY ROM:  B LE WFL (all planes).  L knee 0-130 deg.   LOWER EXTREMITY MMT:  R LE muscle strength grossly 5/5 MMT except hip flexion 4+/5 MMT.  L LE strength grossly 5/5 MMT except hip flexion 4/5 MMT and hip abduction 4+/5 MMT.    FUNCTIONAL TESTS:  5 times sit to stand: 14.1 sec. And requires R UE assist.  Able to stand from chair with addition of 2" Airex pad.    GAIT: Distance walked: in clinic/ //-bars Assistive device utilized: Environmental consultant - 2 wheeled Level of assistance: Min A  Comments: Ambulates in clinic with RW and min. A/ cuing for proper technique and step pattern.  Pt. Able to ambulate in //-bars with R UE assist only and 2-point gait pattern.      TODAY'S TREATMENT:  08/15/22:  Subjective: No c/o pain this morning.  Pt. States he was active over the Thanksgiving weekend.  Pt. Reports back is stiff in the morning and after sitting for prolonged periods of time.  Pt. Feels better as day progresses.    There.ex.:    Nustep L5 10 min. B LE only (consistent cadence).  Discussed weekend activities.    //-bars: forward/backwards/lateral walking with light to no UE assist 4 laps each.  Mirror feedback with increase step length (toe or heel strike).  Focus on increasing step length/ maintaining BOS.    Neuro.mm.:  Golf simulation in gym: 50% swing with SW/ good wt. Shifting.     Resisted gait 2BTB 5x all 4-planes with light UE assist and mirror feedback. Balance challenged, esp. With lateral walking.   5xSTS: 13.04 sec. (No UE assist).   Gait training:   Stairs: reciprocal pattern with R UE assist on handrails.  Pt. Attempted to complete without UE assist but unable to complete safely.  4 steps x 3.    Walking  in clinic/ gym with no assistive device and working on recip. Pattern/ heel strike.  Walking in hallway with increase cadence.    Walking outside on varying terrain around PT clinic to car.  No assistive device and instruct in more normalized gait pattern/ step pattern.        PATIENT EDUCATION:  Education details: Reviewed HEP Person educated: Patient Education method: Explanation, Demonstration, and Handouts Education comprehension: verbalized understanding and returned demonstration   HOME EXERCISE PROGRAM: See handouts.   ASSESSMENT:  CLINICAL IMPRESSION: Patient is highly motivated and hard working during standing/ walking tasks today.  Pt. Walking more consistently around PT clinic without use of SPC and recip. Gait pattern.   Pt. Mod. I with modified golf swing/ chipping in clinic with good wt. Shifting/ posture.  No LOB during tx. But moderate fatigue with resisted gait.   Pt. Will benefit from skilled PT services to increase LE muscle strength to improve independence with walking/ return to playing golf.       OBJECTIVE IMPAIRMENTS Abnormal gait, decreased activity tolerance, decreased balance, decreased endurance, decreased mobility, difficulty walking, decreased strength, impaired flexibility, improper body mechanics, postural dysfunction, and pain.   ACTIVITY LIMITATIONS carrying, lifting, bending, standing, squatting, stairs, transfers, toileting, dressing, and locomotion level  PARTICIPATION LIMITATIONS: driving, community activity, and yard work  PERSONAL FACTORS Fitness and Past/current experiences are also affecting patient's functional outcome.   REHAB POTENTIAL: Good  CLINICAL DECISION MAKING: Stable/uncomplicated  EVALUATION COMPLEXITY: Low   GOALS: Goals reviewed with patient? Yes  SHORT TERM GOALS: Target date: 08/10/22 Pt. Independent with HEP to increase B hip strength 1/2 muscle grade to improve standing/walking mod. Independence.   Baseline:  see  above.  L hip flexion 4/5 MMT.  Goal status: Partially met    LONG TERM GOALS: Target date: 08/26/22  Pt. Will increase FOTO to 59 to improve functional mobility.   Baseline:  initial FOTO 40.  11/8: 68 Goal status: Goal met  2.  Pt. Able to ascend/descend 13 stairs with R handrail and consistent step pattern to improve pts. Ability to go to 2nd floor of home.  11/8: recip. Gait on stairs outside with R handrail for safety.   Baseline:  Goal status: Goal met  3.  Pt. Will ambulate with mod. I and SPC with consistent 2-point gait pattern and no loss of balance to improve independence at home/ community.   Baseline:  11/8: pt. Ambulates community distances Goal status: Goal met  4.  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    Baseline:  pt. Requires extra time/ SPC while walking outside and limited with carrying  5.  Pt. Able to return to hitting golf balls/ playing round of golf with no limitations safely.    Baseline:  pt. Demonstrates chipping golf balls in gym with SBA/CGA    PLAN: PT FREQUENCY: 2x/week  PT DURATION: 4 weeks  PLANNED INTERVENTIONS: Therapeutic exercises, Therapeutic activity, Neuromuscular re-education, Balance training, Gait training, Patient/Family education, Self Care, Joint mobilization, Stair training, Prosthetic training, Manual therapy, and Re-evaluation  PLAN FOR NEXT SESSION: Progress gait with least assistive device.  Simulate golf swing  Pura Spice, PT, DPT # [phone] 08/15/2022, 12:16 PM

## 2022-08-17 ENCOUNTER — Ambulatory Visit: Payer: Medicare HMO | Admitting: Physical Therapy

## 2022-08-17 DIAGNOSIS — R269 Unspecified abnormalities of gait and mobility: Secondary | ICD-10-CM

## 2022-08-17 DIAGNOSIS — M6281 Muscle weakness (generalized): Secondary | ICD-10-CM

## 2022-08-17 DIAGNOSIS — Z89512 Acquired absence of left leg below knee: Secondary | ICD-10-CM

## 2022-08-17 NOTE — Therapy (Signed)
OUTPATIENT PHYSICAL THERAPY LOWER EXTREMITY TREATMENT  Patient Name: Phillip [name] MRN: [id number] DOB:1946/04/07, 76 y.o., male Today's Date: 08/17/2022   PT End of Session - 08/17/22 0812     Visit Number 12    Number of Visits 17    Date for PT Re-Evaluation 08/26/22    PT Start Time 0812    PT Stop Time 0904    PT Time Calculation (min) 52 min             Past Medical History:  Diagnosis Date   Arthritis    Basal cell carcinoma    L clavicle, L prox forearm- removed years ago    Bladder cancer [facility])    Bladder neck contracture    CAD (coronary artery disease)    a. 12/2020 NSTEMI/Cath: LM nl, LAD 95ost/p, LCX 50p, RCA nl. EF 45-50%.   Cataract    CKD (chronic kidney disease), stage III [facility])    ED (erectile dysfunction)    Frequency    GERD (gastroesophageal reflux disease)    History of pulmonary embolus (PE) 11/2018   a. Following LE DVT-->Chronic warfarin.   Hyperlipidemia LDL goal <70    Hypertension    Hypothyroidism    Incontinence of urine    sui, s/p cryoablation   Ischemic cardiomyopathy    a. 11/2018 Echo: EF 60-65%; b. 12/2020 LV Gram: EF 45-50% in setting of NSTEMI.   Kidney stones    Neuropathy    feet   Nocturia    Prostate cancer ([place])    S/P   CRYOABLATION   Right elbow tendinitis    Right Lower Extremity DVT (deep venous thrombosis) ([place]) 11/25/2018   Vertigo    1-2x/yr   Wears glasses    Past Surgical History:  Procedure Laterality Date   AMPUTATION Left 03/16/2022   Procedure: AMPUTATION BELOW KNEE;  Surgeon: Katha Cabal, MD;  Location: [facility] ORS;  Service: Vascular;  Laterality: Left;   ARTHROPLASTY AND TENDON REPAIR LEFT THUMB  JAN 2014   CATARACT EXTRACTION W/PHACO Left 11/16/2020   Procedure: CATARACT EXTRACTION PHACO AND INTRAOCULAR LENS PLACEMENT (IOC) LEFT  7.09 00:56.4 12.6%;  Surgeon: Leandrew Koyanagi, MD;  Location: [place];  Service: Ophthalmology;  Laterality: Left;   CATARACT EXTRACTION W/PHACO  Right 12/02/2020   Procedure: CATARACT EXTRACTION PHACO AND INTRAOCULAR LENS PLACEMENT (IOC) RIGHT MALYUGIN 5.52 00:49.7 11.1%;  Surgeon: Leandrew Koyanagi, MD;  Location: [place];  Service: Ophthalmology;  Laterality: Right;   COLONOSCOPY     COLONOSCOPY WITH PROPOFOL N/A 08/09/2017   Procedure: COLONOSCOPY WITH PROPOFOL;  Surgeon: Manya Silvas, MD;  Location: [facility]OSCOPY;  Service: Endoscopy;  Laterality: N/A;   CORONARY ANGIOGRAPHY N/A 12/28/2020   Procedure: CORONARY ANGIOGRAPHY;  Surgeon: Sherren Mocha, MD;  Location: [place] CV LAB;  Service: Cardiovascular;  Laterality: N/A;   CORONARY STENT INTERVENTION N/A 12/28/2020   Procedure: CORONARY STENT INTERVENTION;  Surgeon: Sherren Mocha, MD;  Location: [place] CV LAB;  Service: Cardiovascular;  Laterality: N/A;   ESOPHAGOGASTRODUODENOSCOPY (EGD) WITH PROPOFOL N/A 08/09/2017   Procedure: ESOPHAGOGASTRODUODENOSCOPY (EGD) WITH PROPOFOL;  Surgeon: Manya Silvas, MD;  Location: [facility]COPY;  Service: Endoscopy;  Laterality: N/A;   GREEN LIGHT LASER TURP (TRANSURETHRAL RESECTION OF PROSTATE N/A 12/14/2015   Procedure: GREEN LIGHT LASER TURP (TRANSURETHRAL RESECTION OF PROSTATE) LASER OF BLADDER NECK CONTRACTURE;  Surgeon: Carolan Clines, MD;  Location: [place];  Service: Urology;  Laterality: N/A;   IVC FILTER INSERTION N/A 03/19/2022  Procedure: IVC FILTER INSERTION;  Surgeon: Serafina Mitchell, MD;  Location: [place] CV LAB;  Service: Cardiovascular;  Laterality: N/A;   LEFT HEART CATH AND CORONARY ANGIOGRAPHY N/A 12/24/2020   Procedure: LEFT HEART CATH AND CORONARY ANGIOGRAPHY and possible PCI and stent;  Surgeon: Yolonda Kida, MD;  Location: [place] CV LAB;  Service: Cardiovascular;  Laterality: N/A;   LEFT HEART CATH AND CORONARY ANGIOGRAPHY N/A 01/06/2021   Procedure: LEFT HEART CATH AND CORONARY ANGIOGRAPHY;  Surgeon: Troy Sine, MD;  Location: [place] CV LAB;   Service: Cardiovascular;  Laterality: N/A;   LOWER EXTREMITY ANGIOGRAPHY Left 03/11/2022   Procedure: Lower Extremity Angiography;  Surgeon: Katha Cabal, MD;  Location: [place] CV LAB;  Service: Cardiovascular;  Laterality: Left;   PENILE PROSTHESIS IMPLANT N/A 12/06/2013   Procedure: PENILE PROTHESIS INFLATABLE;  Surgeon: Ailene Rud, MD;  Location: [facility];  Service: Urology;  Laterality: N/A;   PROSTATE CRYOABLATION  06-01-2012  [place]   PULMONARY THROMBECTOMY N/A 03/18/2022   Procedure: PULMONARY THROMBECTOMY;  Surgeon: Algernon Huxley, MD;  Location: [place] CV LAB;  Service: Cardiovascular;  Laterality: N/A;   PULMONARY THROMBECTOMY Bilateral 03/23/2022   Procedure: PULMONARY THROMBECTOMY;  Surgeon: Katha Cabal, MD;  Location: [place] [facility] LAB;  Service: Cardiovascular;  Laterality: Bilateral;   THROMBECTOMY ILIAC ARTERY Left 03/12/2022   Procedure: THROMBECTOMY LEG;  Surgeon: Conrad Carthage, MD;  Location: [facility];  Service: Vascular;  Laterality: Left;   TRANSURETHRAL RESECTION OF BLADDER NECK N/A 03/20/2015   Procedure: RELEASE BLADDER NECK CONTRACTURE  WITH WOLF BUTTON ELECTRODE ;  Surgeon: Carolan Clines, MD;  Location: [place];  Service: Urology;  Laterality: N/A;   TRANSURETHRAL RESECTION OF BLADDER TUMOR N/A 12/05/2018   Procedure: TRANSURETHRAL RESECTION OF BLADDER TUMOR (TURBT)/CYSTOSCOPY/  INSTILLATION OF Rodell Perna;  Surgeon: Ceasar Mons, MD;  Location: [facility];  Service: Urology;  Laterality: N/A;   TRANSURETHRAL RESECTION OF BLADDER TUMOR N/A 01/15/2020   Procedure: TRANSURETHRAL RESECTION OF BLADDER TUMOR (TURBT)/ CYSTOSCOPY;  Surgeon: Ceasar Mons, MD;  Location: [facility];  Service: Urology;  Laterality: N/A;   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     Patient Active Problem List   Diagnosis Date Noted   Iron  deficiency anemia 06/20/2022   Myeloproliferative disorder ([place]) 05/19/2022   History of bladder cancer 05/19/2022   Goals of care, counseling/discussion 03/23/2022   Reactive thrombocytosis 03/21/2022   Paroxysmal atrial fibrillation with RVR ([place]) 03/20/2022   Acute bilateral deep vein thrombosis (DVT) of femoral veins (HCC) 03/20/2022   Acute respiratory failure with hypoxia (HCC) 03/16/2022   Acute on chronic diastolic CHF (congestive heart failure) ([place]) 03/16/2022   PSVT (paroxysmal supraventricular tachycardia) 03/16/2022   AKI (acute kidney injury) ([place]) 03/15/2022   Critical limb ischemia of left lower extremity with ulceration of lower leg ([place]) 03/15/2022   Arterial occlusion 03/11/2022   Elevated troponin    Unstable angina ([place]) 01/05/2021   Hyperlipidemia LDL goal <70    CKD (chronic kidney disease), stage III (HCC)    Coronary artery disease    NSTEMI (non-ST elevated myocardial infarction) ([place]) 12/23/2020   Acquired hypothyroidism 04/20/2020   Chemotherapy-induced neuropathy ([place]) 04/20/2020   Nonthrombocytopenic purpura ([place]) 12/03/2019   Hypercoagulable state ([place]) 12/10/2018   Recurrent deep vein thrombosis (DVT) ([place])    Bilateral pulmonary embolism ([place]) 12/07/2018   GERD (gastroesophageal reflux disease) 12/07/2018  HTN (hypertension) 12/07/2018   HLD (hyperlipidemia) 12/07/2018   Prostate cancer ([place]) 12/07/2018   Bladder cancer ([place]) 12/07/2018   DM type 2 with diabetic mixed hyperlipidemia ([place]) 04/04/2018   Pain in right hand 10/04/2017   Tubular adenoma 57/32/2025   Helicobacter pylori (H. pylori) infection 09/04/2017   Medicare annual wellness visit, initial 03/31/2017   History of prostate cancer 10/27/2014   Lower urinary tract symptoms (LUTS) 03/18/2012   Nephrolithiasis 03/18/2012    PCP: Rusty Aus, MD  REFERRING PROVIDER: Kris Hartmann, NP  REFERRING DIAG: Left below knee amputee  THERAPY DIAG:  Gait difficulty  Muscle  weakness (generalized)  Left below-knee amputee [facility])  Rationale for Evaluation and Treatment Rehabilitation  ONSET DATE: 03/16/22  SUBJECTIVE:   SUBJECTIVE STATEMENT:   EVALUATION Pt. S/p R BKA after blood clots in lower leg.  Pt. Reports minimum phantom limb symptoms and occasional muscle spasms.  Pt. Takes Baclofen to manage symptoms.  Pt. Received prosthetic leg on Wednesday from [facility].  Pt. Has h/o low back pain and has received spinal epidurals in past (2 years ago).    PERTINENT HISTORY: 1. Hx of BKA, left [facility]) Mr. Macai Sisneros was seen for further evaluation for left below knee prosthesis.  The is a 76 year old male who had amputation on 03/16/2022.  At the time he is well healed and ready for fitting of new below knee prosthesis.  He is a highly motivated individual and should do well once fitted with prosthesis.  She has no problem returning to a K2 ambulator, which will allow him to walk inside and outside of his home and overload level barriers.    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  He will be having a bone marrow biopsy.  We will continue to have the patient keep his IVC filter for now as there may be further test.  We will have the patient return in 3 months to discuss filter removal.    PAIN:  Are you having pain? No  PRECAUTIONS: None  WEIGHT BEARING RESTRICTIONS No  FALLS:  Has patient fallen in last 6 months? No  LIVING ENVIRONMENT: Lives with: lives with their spouse Lives in: House/apartment Stairs: Yes: Internal: 13+ steps; on right going up Has following equipment at home: Single point cane and Walker - 2 wheeled  OCCUPATION: Retired  PLOF: [place] with walking and return to playing golf by spring.     OBJECTIVE:   PATIENT SURVEYS:  FOTO initial 40/ goal 84.    COGNITION:  Overall cognitive status: Within functional limits for tasks  assessed     SENSATION: WFL  EDEMA:  Circumferential: L/R knee joint line (34/36 cm.), calf (30.5/ 33 cm), distal quad (35/36 cm).    POSTURE: rounded shoulders and forward head  PALPATION: No tenderness/ good incision healing on residual limb  LOWER EXTREMITY ROM:  B LE WFL (all planes).  L knee 0-130 deg.   LOWER EXTREMITY MMT:  R LE muscle strength grossly 5/5 MMT except hip flexion 4+/5 MMT.  L LE strength grossly 5/5 MMT except hip flexion 4/5 MMT and hip abduction 4+/5 MMT.    FUNCTIONAL TESTS:  5 times sit to stand: 14.1 sec. And requires R UE assist.  Able to stand from chair with addition of 2" Airex pad.    GAIT: Distance walked: in clinic/ //-bars Assistive device utilized: Environmental consultant - 2 wheeled Level of assistance: Min A  Comments: Ambulates in clinic with RW and min. A/ cuing for proper technique and step pattern.  Pt. Able to ambulate in //-bars with R UE assist only and 2-point gait pattern.    11/27:  5xSTS: 13.04 sec. (No UE assist).    TODAY'S TREATMENT:  08/17/22:  Subjective: Pt. Went to [facility] last 2 days and did bike/ leg press/ UE ex.  No c/o pain reported this morning.  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     There.ex.:    Nustep L5 10 min. B LE only (consistent cadence).  Discussed [employer] ex.  Sled push/pull 20# in hallway 2x.  Focus on step pattern/ control of sled  Hallway walking:  forward/ lateral walking with no UE assist 2 laps each.   Focus on increasing step length/ maintaining BOS/ posture.    Neuro.mm.:    Nautilus resisted gait:  4x forward/backwards (cuing for consistent step pattern).    SBA/CGA for safety and verbal cuing.     Walking in //-bars with 6" and 12" hurdles 2 laps.  Difficulty with L hip/knee flexion for 12" hurdles.     Walking in clinic/ climbing stairs while carrying tray/ bag to simulate [employer] delivers.    Gait training:  Walking in clinic/ gym with no assistive device and working on  recip. Pattern/ heel strike.  Walking in hallway with increase cadence.    Walking outside on varying terrain around PT clinic to car.  Walking on grass with no assistive device and instructed in more normalized gait pattern/ step pattern.        PATIENT EDUCATION:  Education details: Reviewed HEP Person educated: Patient Education method: Explanation, Demonstration, and Handouts Education comprehension: verbalized understanding and returned demonstration   HOME EXERCISE PROGRAM: See handouts.   ASSESSMENT:  CLINICAL IMPRESSION: Patient is highly motivated and hard working during standing/ walking tasks today.  Pt. Walking more consistently around PT clinic without use of SPC and recip. Gait pattern.   Pt. Demonstrates ability to walk on level surfaces while carrying a tray/ bag in gym.  No LOB with balance tasks and challenged with bilateral carrying activity at stairs. Pt. Will benefit from skilled PT services to increase LE muscle strength to improve independence with walking/ return to playing golf.       OBJECTIVE IMPAIRMENTS Abnormal gait, decreased activity tolerance, decreased balance, decreased endurance, decreased mobility, difficulty walking, decreased strength, impaired flexibility, improper body mechanics, postural dysfunction, and pain.   ACTIVITY LIMITATIONS carrying, lifting, bending, standing, squatting, stairs, transfers, toileting, dressing, and locomotion level  PARTICIPATION LIMITATIONS: driving, community activity, and yard work  PERSONAL FACTORS Fitness and Past/current experiences are also affecting patient's functional outcome.   REHAB POTENTIAL: Good  CLINICAL DECISION MAKING: Stable/uncomplicated  EVALUATION COMPLEXITY: Low   GOALS: Goals reviewed with patient? Yes  SHORT TERM GOALS: Target date: 08/10/22 Pt. Independent with HEP to increase B hip strength 1/2 muscle grade to improve standing/walking mod. Independence.   Baseline:  see above.  L  hip flexion 4/5 MMT.  Goal status: Partially met    LONG TERM GOALS: Target date: 08/26/22  Pt. Will increase FOTO to 59 to improve functional mobility.   Baseline:  initial FOTO 40.  11/8: 68 Goal status: Goal met  2.  Pt. Able to ascend/descend 13 stairs with R handrail and consistent step pattern to improve pts. Ability to go to 2nd floor of home.  11/8: recip. Gait on stairs outside with R handrail  for safety.   Baseline:  Goal status: Goal met  3.  Pt. Will ambulate with mod. I and SPC with consistent 2-point gait pattern and no loss of balance to improve independence at home/ community.   Baseline:  11/8: pt. Ambulates community distances Goal status: Goal met  4.  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    Baseline:  pt. Requires extra time/ SPC while walking outside and limited with carrying  5.  Pt. Able to return to hitting golf balls/ playing round of golf with no limitations safely.    Baseline:  pt. Demonstrates chipping golf balls in gym with SBA/CGA    PLAN: PT FREQUENCY: 2x/week  PT DURATION: 4 weeks  PLANNED INTERVENTIONS: Therapeutic exercises, Therapeutic activity, Neuromuscular re-education, Balance training, Gait training, Patient/Family education, Self Care, Joint mobilization, Stair training, Prosthetic training, Manual therapy, and Re-evaluation  PLAN FOR NEXT SESSION: Progress gait with least assistive device.  Simulate golf swing  Pura Spice, PT, DPT # [id number] 08/17/2022, 12:13 PM

## 2022-08-19 ENCOUNTER — Ambulatory Visit (INDEPENDENT_AMBULATORY_CARE_PROVIDER_SITE_OTHER): Payer: Medicare HMO | Admitting: Nurse Practitioner

## 2022-08-19 ENCOUNTER — Encounter (INDEPENDENT_AMBULATORY_CARE_PROVIDER_SITE_OTHER): Payer: Self-pay | Admitting: Nurse Practitioner

## 2022-08-19 VITALS — BP 134/78 | HR 63 | Resp 16 | Wt 168.0 lb

## 2022-08-19 DIAGNOSIS — Z89512 Acquired absence of left leg below knee: Secondary | ICD-10-CM

## 2022-08-19 DIAGNOSIS — I10 Essential (primary) hypertension: Secondary | ICD-10-CM | POA: Diagnosis not present

## 2022-08-19 DIAGNOSIS — I824Y3 Acute embolism and thrombosis of unspecified deep veins of proximal lower extremity, bilateral: Secondary | ICD-10-CM | POA: Diagnosis not present

## 2022-08-22 ENCOUNTER — Other Ambulatory Visit: Payer: Self-pay

## 2022-08-22 ENCOUNTER — Encounter: Payer: Self-pay | Admitting: Emergency Medicine

## 2022-08-22 ENCOUNTER — Emergency Department: Payer: Medicare HMO

## 2022-08-22 ENCOUNTER — Emergency Department
Admission: EM | Admit: 2022-08-22 | Discharge: 2022-08-22 | Disposition: A | Discharge status: Home / Self Care | Payer: Medicare HMO | Attending: Emergency Medicine | Admitting: Emergency Medicine

## 2022-08-22 ENCOUNTER — Ambulatory Visit: Payer: Medicare HMO | Admitting: Physical Therapy

## 2022-08-22 ENCOUNTER — Encounter: Payer: Self-pay | Admitting: Urology

## 2022-08-22 DIAGNOSIS — Z86718 Personal history of other venous thrombosis and embolism: Secondary | ICD-10-CM | POA: Diagnosis not present

## 2022-08-22 DIAGNOSIS — M79642 Pain in left hand: Secondary | ICD-10-CM

## 2022-08-22 DIAGNOSIS — E1122 Type 2 diabetes mellitus with diabetic chronic kidney disease: Secondary | ICD-10-CM | POA: Diagnosis not present

## 2022-08-22 DIAGNOSIS — Z7901 Long term (current) use of anticoagulants: Secondary | ICD-10-CM | POA: Diagnosis not present

## 2022-08-22 DIAGNOSIS — Z8551 Personal history of malignant neoplasm of bladder: Secondary | ICD-10-CM | POA: Diagnosis not present

## 2022-08-22 DIAGNOSIS — N189 Chronic kidney disease, unspecified: Secondary | ICD-10-CM | POA: Insufficient documentation

## 2022-08-22 DIAGNOSIS — Z86711 Personal history of pulmonary embolism: Secondary | ICD-10-CM | POA: Diagnosis not present

## 2022-08-22 DIAGNOSIS — I808 Phlebitis and thrombophlebitis of other sites: Secondary | ICD-10-CM | POA: Diagnosis not present

## 2022-08-22 DIAGNOSIS — I2510 Atherosclerotic heart disease of native coronary artery without angina pectoris: Secondary | ICD-10-CM | POA: Diagnosis not present

## 2022-08-22 DIAGNOSIS — I82612 Acute embolism and thrombosis of superficial veins of left upper extremity: Secondary | ICD-10-CM | POA: Diagnosis not present

## 2022-08-22 DIAGNOSIS — I129 Hypertensive chronic kidney disease with stage 1 through stage 4 chronic kidney disease, or unspecified chronic kidney disease: Secondary | ICD-10-CM | POA: Insufficient documentation

## 2022-08-22 DIAGNOSIS — I10 Essential (primary) hypertension: Secondary | ICD-10-CM | POA: Diagnosis not present

## 2022-08-22 LAB — D-DIMER, QUANTITATIVE: D-Dimer, Quant: 0.43 ug/mL-FEU (ref 0.00–0.50)

## 2022-08-22 LAB — CBC WITH DIFFERENTIAL/PLATELET
Abs Immature Granulocytes: 0.02 10*3/uL (ref 0.00–0.07)
Basophils Absolute: 0.1 10*3/uL (ref 0.0–0.1)
Basophils Relative: 1 %
Eosinophils Absolute: 0.2 10*3/uL (ref 0.0–0.5)
Eosinophils Relative: 3 %
HCT: 46.3 % (ref 39.0–52.0)
Hemoglobin: 14.7 g/dL (ref 13.0–17.0)
Immature Granulocytes: 0 %
Lymphocytes Relative: 23 %
Lymphs Abs: 2 10*3/uL (ref 0.7–4.0)
MCH: 29.8 pg (ref 26.0–34.0)
MCHC: 31.7 g/dL (ref 30.0–36.0)
MCV: 93.9 fL (ref 80.0–100.0)
Monocytes Absolute: 0.7 10*3/uL (ref 0.1–1.0)
Monocytes Relative: 8 %
Neutro Abs: 5.6 10*3/uL (ref 1.7–7.7)
Neutrophils Relative %: 65 %
Platelets: 340 10*3/uL (ref 150–400)
RBC: 4.93 MIL/uL (ref 4.22–5.81)
RDW: 20.3 % — ABNORMAL HIGH (ref 11.5–15.5)
WBC: 8.7 10*3/uL (ref 4.0–10.5)
nRBC: 0 % (ref 0.0–0.2)

## 2022-08-22 LAB — COMPREHENSIVE METABOLIC PANEL
ALT: 20 U/L (ref 0–44)
AST: 20 U/L (ref 15–41)
Albumin: 3.8 g/dL (ref 3.5–5.0)
Alkaline Phosphatase: 47 U/L (ref 38–126)
Anion gap: 3 — ABNORMAL LOW (ref 5–15)
BUN: 20 mg/dL (ref 8–23)
CO2: 29 mmol/L (ref 22–32)
Calcium: 9.1 mg/dL (ref 8.9–10.3)
Chloride: 110 mmol/L (ref 98–111)
Creatinine, Ser: 1.29 mg/dL — ABNORMAL HIGH (ref 0.61–1.24)
GFR, Estimated: 58 mL/min — ABNORMAL LOW (ref >60)
Glucose, Bld: 118 mg/dL — ABNORMAL HIGH (ref 70–99)
Potassium: 3.6 mmol/L (ref 3.5–5.1)
Sodium: 142 mmol/L (ref 135–145)
Total Bilirubin: 0.8 mg/dL (ref 0.3–1.2)
Total Protein: 7.3 g/dL (ref 6.5–8.1)

## 2022-08-22 NOTE — ED Triage Notes (Signed)
Patient reports severe left hand pain without injury that woke him from his sleep. Denies chest pain or shortness of breath. States he lost his left leg to a blood clot in June and was concerned that he had a blood clot in his hand. States he takes plavix and eliquis. AOX4. Speaking in full clear sentences. Resp even, unlabored on RA.

## 2022-08-22 NOTE — ED Provider Notes (Signed)
[facility] Provider Note    Event Date/Time   First MD Initiated Contact with Patient 08/22/22 [phone]     (approximate)   History   Chief Complaint Hand Pain   HPI  Phillip [name] is a 76 y.o. male with past medical history of hypertension, hyperlipidemia, diabetes, CAD, DVT/PE on Eliquis, bladder cancer, CKD, and left BKA who presents to the ED complaining of hand pain.  Patient reports that he was woken from sleep between 3 and 4 AM with aching pain in his left hand.  He states he will sometimes wake up with similar aches and pains but this pain was more severe and did not go away on its own.  He denies any recent trauma to his hand and has not done any heavy lifting.  He has not noticed any redness or swelling in the hand.  He is primarily concerned that he could have developed a clot in his hand, reports he has been compliant with Coumadin.      Physical Exam   Triage Vital Signs: ED Triage Vitals  Enc Vitals Group     BP 08/22/22 0439 (!) 141/80     Pulse Rate 08/22/22 0439 72     Resp 08/22/22 0439 20     Temp 08/22/22 0439 97.7 F (36.5 C)     Temp Source 08/22/22 0439 Oral     SpO2 08/22/22 0445 99 %     Weight 08/22/22 0439 168 lb (76.2 kg)     Height 08/22/22 0439 '5\' 8"'$  (1.727 m)     Head Circumference --      Peak Flow --      Pain Score 08/22/22 0439 8     Pain Loc --      Pain Edu? --      Excl. in [place]? --     Most recent vital signs: Vitals:   08/22/22 0445 08/22/22 1000  BP:  138/78  Pulse:  70  Resp:  18  Temp:  98 F (36.7 C)  SpO2: 99% 99%    Constitutional: Alert and oriented. Eyes: Conjunctivae are normal. Head: Atraumatic. Nose: No congestion/rhinnorhea. Mouth/Throat: Mucous membranes are moist.  Cardiovascular: Normal rate, regular rhythm. Grossly normal heart sounds.  2+ radial pulses bilaterally, cap refill less than 2 seconds throughout all digits of left hand.Marland Kitchen Respiratory: Normal respiratory effort.  No  retractions. Lungs CTAB. Gastrointestinal: Soft and nontender. No distention. Musculoskeletal: No lower extremity tenderness nor edema.  Mild tenderness to palpation over dorsal and ventral surface of left hand with no associated erythema, edema, or warmth.  Range of motion intact to left wrist and digits of left hand without pain. Neurologic:  Normal speech and language. No gross focal neurologic deficits are appreciated.    ED Results / Procedures / Treatments   Labs (all labs ordered are listed, but only abnormal results are displayed) Labs Reviewed  CBC WITH DIFFERENTIAL/PLATELET - Abnormal; Notable for the following components:      Result Value   RDW 20.3 (*)    All other components within normal limits  COMPREHENSIVE METABOLIC PANEL - Abnormal; Notable for the following components:   Glucose, Bld 118 (*)    Creatinine, Ser 1.29 (*)    GFR, Estimated 58 (*)    Anion gap 3 (*)    All other components within normal limits  D-DIMER, QUANTITATIVE   RADIOLOGY Left upper extremity ultrasound reviewed and interpreted by me with normal compressibility of deep veins,  no evidence of DVT.  PROCEDURES:  Critical Care performed: No  Procedures   MEDICATIONS ORDERED IN ED: Medications - No data to display   IMPRESSION / MDM / [place] / ED COURSE  I reviewed the triage vital signs and the nursing notes.                              76 y.o. male with past medical history of hypertension, hyperlipidemia, diabetes, DVT/PE on Eliquis, CAD, CKD, bladder cancer, and left BKA who presents to the ED complaining of development of pain in his left hand overnight with no recent injury.  Patient's presentation is most consistent with acute presentation with potential threat to life or bodily function.  Differential diagnosis includes, but is not limited to, DVT, arterial insufficiency, cellulitis, abscess, contusion, strain, arthritis.  Patient well-appearing and in no acute  distress, vital signs are unremarkable.  He is neurovascular intact to his left upper extremity with strong radial pulse and cap refill less than 2 seconds in all digits.  There were no signs concerning for infection on exam, he has mild tenderness over the central portion of his hand with no recent traumatic injury to necessitate x-ray imaging.  Left lower extremity ultrasound was performed and negative for DVT, does show superficial thrombophlebitis in the area of his basilic vein.  He has no symptoms in his upper arm, but was counseled to use warm compresses as needed, continue anticoagulation as prescribed.  Labs are reassuring with stable chronic kidney disease, no significant anemia, leukocytosis, electrode abnormality, or LFT abnormality.  He is appropriate for discharge home with PCP or vascular surgery follow-up, was counseled to return to the ED for new or worsening symptoms.  Patient agrees with plan.      FINAL CLINICAL IMPRESSION(S) / ED DIAGNOSES   Final diagnoses:  Left hand pain  History of DVT (deep vein thrombosis)     Rx / DC Orders   ED Discharge Orders     None        Note:  This document was prepared using Dragon voice recognition software and may include unintentional dictation errors.   Blake Divine, MD 08/22/22 [phone]

## 2022-08-22 NOTE — ED Notes (Signed)
Patient reports continued severe pain to left hand. RN able to palpate radial and ulnar pulse. Cap refill < 3 seconds, skin pink. Patient states he feels his hand feels cold but no notable difference in temperature noted to hands.

## 2022-08-25 ENCOUNTER — Ambulatory Visit: Payer: Medicare HMO | Attending: Nurse Practitioner | Admitting: Physical Therapy

## 2022-08-25 DIAGNOSIS — Z89512 Acquired absence of left leg below knee: Secondary | ICD-10-CM | POA: Diagnosis not present

## 2022-08-25 DIAGNOSIS — R269 Unspecified abnormalities of gait and mobility: Secondary | ICD-10-CM | POA: Insufficient documentation

## 2022-08-25 DIAGNOSIS — M6281 Muscle weakness (generalized): Secondary | ICD-10-CM | POA: Diagnosis not present

## 2022-08-25 DIAGNOSIS — I739 Peripheral vascular disease, unspecified: Secondary | ICD-10-CM | POA: Diagnosis not present

## 2022-08-25 DIAGNOSIS — M79642 Pain in left hand: Secondary | ICD-10-CM | POA: Diagnosis not present

## 2022-08-25 NOTE — Therapy (Signed)
OUTPATIENT PHYSICAL THERAPY LOWER EXTREMITY TREATMENT  Patient Name: Phillip [name] MRN: [id number] DOB:Mar 06, 1946, 76 y.o., male Today's Date: 08/25/2022   PT End of Session - 08/25/22 0822     Visit Number 13    Number of Visits 17    Date for PT Re-Evaluation 08/26/22    PT Start Time 0813            0813 to 0903  (50 minutes).     Past Medical History:  Diagnosis Date   Arthritis    Basal cell carcinoma    L clavicle, L prox forearm- removed years ago    Bladder cancer (HCC)    Bladder neck contracture    CAD (coronary artery disease)    a. 12/2020 NSTEMI/Cath: LM nl, LAD 95ost/p, LCX 50p, RCA nl. EF 45-50%.   Cataract    CKD (chronic kidney disease), stage III [facility])    ED (erectile dysfunction)    Frequency    GERD (gastroesophageal reflux disease)    History of pulmonary embolus (PE) 11/2018   a. Following LE DVT-->Chronic warfarin.   Hyperlipidemia LDL goal <70    Hypertension    Hypothyroidism    Incontinence of urine    sui, s/p cryoablation   Ischemic cardiomyopathy    a. 11/2018 Echo: EF 60-65%; b. 12/2020 LV Gram: EF 45-50% in setting of NSTEMI.   Kidney stones    Neuropathy    feet   Nocturia    Prostate cancer ([place])    S/P   CRYOABLATION   Right elbow tendinitis    Right Lower Extremity DVT (deep venous thrombosis) ([place]) 11/25/2018   Vertigo    1-2x/yr   Wears glasses    Past Surgical History:  Procedure Laterality Date   AMPUTATION Left 03/16/2022   Procedure: AMPUTATION BELOW KNEE;  Surgeon: Katha Cabal, MD;  Location: [facility];  Service: Vascular;  Laterality: Left;   ARTHROPLASTY AND TENDON REPAIR LEFT THUMB  JAN 2014   CATARACT EXTRACTION W/PHACO Left 11/16/2020   Procedure: CATARACT EXTRACTION PHACO AND INTRAOCULAR LENS PLACEMENT (IOC) LEFT  7.09 00:56.4 12.6%;  Surgeon: Leandrew Koyanagi, MD;  Location: [place];  Service: Ophthalmology;  Laterality: Left;   CATARACT EXTRACTION W/PHACO Right 12/02/2020   Procedure:  CATARACT EXTRACTION PHACO AND INTRAOCULAR LENS PLACEMENT (IOC) RIGHT MALYUGIN 5.52 00:49.7 11.1%;  Surgeon: Leandrew Koyanagi, MD;  Location: [place];  Service: Ophthalmology;  Laterality: Right;   COLONOSCOPY     COLONOSCOPY WITH PROPOFOL N/A 08/09/2017   Procedure: COLONOSCOPY WITH PROPOFOL;  Surgeon: Manya Silvas, MD;  Location: [facility] END[facility];  Service: Endoscopy;  Laterality: N/A;   CORONARY ANGIOGRAPHY N/A 12/28/2020   Procedure: CORONARY ANGIOGRAPHY;  Surgeon: Sherren Mocha, MD;  Location: [place] CV LAB;  Service: Cardiovascular;  Laterality: N/A;   CORONARY STENT INTERVENTION N/A 12/28/2020   Procedure: CORONARY STENT INTERVENTION;  Surgeon: Sherren Mocha, MD;  Location: [place] [facility];  Service: Cardiovascular;  Laterality: N/A;   ESOPHAGOGASTRODUODENOSCOPY (EGD) WITH PROPOFOL N/A 08/09/2017   Procedure: ESOPHAGOGASTRODUODENOSCOPY (EGD) WITH PROPOFOL;  Surgeon: Manya Silvas, MD;  Location: [facility] ENDOSCOPY;  Service: Endoscopy;  Laterality: N/A;   GREEN LIGHT LASER TURP (TRANSURETHRAL RESECTION OF PROSTATE N/A 12/14/2015   Procedure: GREEN LIGHT LASER TURP (TRANSURETHRAL RESECTION OF PROSTATE) LASER OF BLADDER NECK CONTRACTURE;  Surgeon: Carolan Clines, MD;  Location: [place];  Service: Urology;  Laterality: N/A;   IVC FILTER INSERTION N/A 03/19/2022   Procedure: IVC FILTER INSERTION;  Surgeon: Serafina Mitchell, MD;  Location: [place] [facility];  Service: Cardiovascular;  Laterality: N/A;   LEFT HEART CATH AND CORONARY ANGIOGRAPHY N/A 12/24/2020   Procedure: LEFT HEART CATH AND CORONARY ANGIOGRAPHY and possible PCI and stent;  Surgeon: Yolonda Kida, MD;  Location: [place] [facility] LAB;  Service: Cardiovascular;  Laterality: N/A;   LEFT HEART CATH AND CORONARY ANGIOGRAPHY N/A 01/06/2021   Procedure: LEFT HEART CATH AND CORONARY ANGIOGRAPHY;  Surgeon: Troy Sine, MD;  Location: [place] [facility];  Service: Cardiovascular;   Laterality: N/A;   LOWER EXTREMITY ANGIOGRAPHY Left 03/11/2022   Procedure: Lower Extremity Angiography;  Surgeon: Katha Cabal, MD;  Location: [place] [facility] LAB;  Service: Cardiovascular;  Laterality: Left;   PENILE PROSTHESIS IMPLANT N/A 12/06/2013   Procedure: PENILE PROTHESIS INFLATABLE;  Surgeon: Ailene Rud, MD;  Location: [facility];  Service: Urology;  Laterality: N/A;   PROSTATE CRYOABLATION  06-01-2012  [facility]   PULMONARY THROMBECTOMY N/A 03/18/2022   Procedure: PULMONARY THROMBECTOMY;  Surgeon: Algernon Huxley, MD;  Location: [place] [facility] LAB;  Service: Cardiovascular;  Laterality: N/A;   PULMONARY THROMBECTOMY Bilateral 03/23/2022   Procedure: PULMONARY THROMBECTOMY;  Surgeon: Katha Cabal, MD;  Location: [place] [facility];  Service: Cardiovascular;  Laterality: Bilateral;   THROMBECTOMY ILIAC ARTERY Left 03/12/2022   Procedure: THROMBECTOMY LEG;  Surgeon: Conrad , MD;  Location: [facility];  Service: Vascular;  Laterality: Left;   TRANSURETHRAL RESECTION OF BLADDER NECK N/A 03/20/2015   Procedure: RELEASE BLADDER NECK CONTRACTURE  WITH WOLF BUTTON ELECTRODE ;  Surgeon: Carolan Clines, MD;  Location: [place];  Service: Urology;  Laterality: N/A;   TRANSURETHRAL RESECTION OF BLADDER TUMOR N/A 12/05/2018   Procedure: TRANSURETHRAL RESECTION OF BLADDER TUMOR (TURBT)/CYSTOSCOPY/  INSTILLATION OF Rodell Perna;  Surgeon: Ceasar Mons, MD;  Location: [facility];  Service: Urology;  Laterality: N/A;   TRANSURETHRAL RESECTION OF BLADDER TUMOR N/A 01/15/2020   Procedure: TRANSURETHRAL RESECTION OF BLADDER TUMOR (TURBT)/ CYSTOSCOPY;  Surgeon: Ceasar Mons, MD;  Location: [facility];  Service: Urology;  Laterality: N/A;   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     Patient Active Problem List   Diagnosis Date Noted   Iron deficiency anemia 06/20/2022    Myeloproliferative disorder ([place]) 05/19/2022   History of bladder cancer 05/19/2022   Goals of care, counseling/discussion 03/23/2022   Reactive thrombocytosis 03/21/2022   Paroxysmal atrial fibrillation with RVR ([place]) 03/20/2022   Acute bilateral deep vein thrombosis (DVT) of femoral veins (HCC) 03/20/2022   Acute respiratory failure with hypoxia (HCC) 03/16/2022   Acute on chronic diastolic CHF (congestive heart failure) ([place]) 03/16/2022   PSVT (paroxysmal supraventricular tachycardia) 03/16/2022   AKI (acute kidney injury) ([place]) 03/15/2022   Critical limb ischemia of left lower extremity with ulceration of lower leg ([place]) 03/15/2022   Arterial occlusion 03/11/2022   Elevated troponin    Unstable angina ([place]) 01/05/2021   Hyperlipidemia LDL goal <70    CKD (chronic kidney disease), stage III (HCC)    Coronary artery disease    NSTEMI (non-ST elevated myocardial infarction) ([place]) 12/23/2020   Acquired hypothyroidism 04/20/2020   Chemotherapy-induced neuropathy ([place]) 04/20/2020   Nonthrombocytopenic purpura ([place]) 12/03/2019   Hypercoagulable state ([place]) 12/10/2018   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 pylori) infection 09/04/2017   Medicare annual wellness visit, initial 03/31/2017   History of prostate cancer 10/27/2014   Lower urinary tract symptoms (LUTS) 03/18/2012   Nephrolithiasis 03/18/2012    PCP: Rusty Aus, MD  REFERRING PROVIDER: Kris Hartmann, NP  REFERRING DIAG: Left below knee amputee  THERAPY DIAG:  Gait difficulty  Muscle weakness (generalized)  Left  below-knee amputee [facility])  Rationale for Evaluation and Treatment Rehabilitation  ONSET DATE: 03/16/22  SUBJECTIVE:   SUBJECTIVE STATEMENT:   EVALUATION Pt. S/p R BKA after blood clots in lower leg.  Pt. Reports minimum phantom limb symptoms and occasional muscle spasms.  Pt. Takes Baclofen to manage symptoms.  Pt. Received prosthetic leg on Wednesday from [facility].  Pt. Has h/o low back pain and has received spinal epidurals in past (2 years ago).    PERTINENT HISTORY: 1. Hx of BKA, left [facility]) Mr. Jimmie Rueter was seen for further evaluation for left below knee prosthesis.  The is a 76 year old male who had amputation on 03/16/2022.  At the time he is well healed and ready for fitting of new below knee prosthesis.  He is a highly motivated individual and should do well once fitted with prosthesis.  She has no problem returning to a K2 ambulator, which will allow him to walk inside and outside of his home and overload level barriers.    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  He will be having a bone marrow biopsy.  We will continue to have the patient keep his IVC filter for now as there may be further test.  We will have the patient return in 3 months to discuss filter removal.    PAIN:  Are you having pain? No  PRECAUTIONS: None  WEIGHT BEARING RESTRICTIONS No  FALLS:  Has patient fallen in last 6 months? No  LIVING ENVIRONMENT: Lives with: lives with their spouse Lives in: House/apartment Stairs: Yes: Internal: 13+ steps; on right going up Has following equipment at home: Single point cane and Walker - 2 wheeled  OCCUPATION: Retired  PLOF: [place] with walking and return to playing golf by spring.     OBJECTIVE:   PATIENT SURVEYS:  FOTO initial 40/ goal 43.    COGNITION:  Overall cognitive status: Within functional limits for tasks assessed     SENSATION: WFL  EDEMA:   Circumferential: L/R knee joint line (34/36 cm.), calf (30.5/ 33 cm), distal quad (35/36 cm).    POSTURE: rounded shoulders and forward head  PALPATION: No tenderness/ good incision healing on residual limb  LOWER EXTREMITY ROM:  B LE WFL (all planes).  L knee 0-130 deg.   LOWER EXTREMITY MMT:  R LE muscle strength grossly 5/5 MMT except hip flexion 4+/5 MMT.  L LE strength grossly 5/5 MMT except hip flexion 4/5 MMT and hip abduction 4+/5 MMT.    FUNCTIONAL TESTS:  5 times sit to stand: 14.1 sec. And requires R UE assist.  Able to stand from chair with addition of 2" Airex pad.    GAIT: Distance walked: in clinic/ //-bars Assistive device utilized: Environmental consultant - 2 wheeled Level of assistance: Min A Comments: Ambulates in clinic with  RW and min. A/ cuing for proper technique and step pattern.  Pt. Able to ambulate in //-bars with R UE assist only and 2-point gait pattern.    11/27:  5xSTS: 13.04 sec. (No UE assist).    TODAY'S TREATMENT:  08/25/22:  Subjective: Pt. Has continued to go to [facility] for bike/ leg press/ UE ex.  Pt. Did [employer] the other day and states he had a difficult time walking on uneven sidewalks/ terrain.  Pt. Missed last PT appt. Due to being at ER secondary to R hand pain (no blood clot)- ? Cervical radiculopathy.  There.ex.:    Nustep L5 10 min. B LE only (consistent cadence).  Discussed [employer] ex./ recent ER visit.    Sled push/pull 20# in hallway 2x.  Focus on step pattern/ control of sled  BOSU step ups with L LE 20x (requires UE assist).      Hallway walking:  change in cadence/ heel strike focus/ alt. UE and LE touches.     Neuro.mm.:    Resisted gait 2BTB:  4x forward/backwards (cuing for consistent step pattern).    SBA/CGA for safety and verbal cuing.     Tandem gait/ stance in //-bars.  Gait training:  Walking in clinic/ gym with no assistive device and working on recip. Pattern/ heel strike.  Walking in hallway with increase cadence.     Walking outside on varying terrain around PT clinic to car.  Walking on grass with no assistive device/ ascending and descending curb.  Instructed in more normalized gait pattern/ step pattern.        PATIENT EDUCATION:  Education details: Reviewed HEP Person educated: Patient Education method: Explanation, Demonstration, and Handouts Education comprehension: verbalized understanding and returned demonstration   HOME EXERCISE PROGRAM: See handouts.   ASSESSMENT:  CLINICAL IMPRESSION: Patient is highly motivated and hard working during standing/ walking tasks today.  Pt. Walking more consistently around PT clinic without use of SPC and recip. Gait pattern.   Pt. Demonstrates ability to walk on level surfaces while carrying a tray/ bag in gym.  No LOB with balance tasks and challenged with bilateral carrying activity at stairs. Pt. Will benefit from skilled PT services to increase LE muscle strength to improve independence with walking/ return to playing golf.       OBJECTIVE IMPAIRMENTS Abnormal gait, decreased activity tolerance, decreased balance, decreased endurance, decreased mobility, difficulty walking, decreased strength, impaired flexibility, improper body mechanics, postural dysfunction, and pain.   ACTIVITY LIMITATIONS carrying, lifting, bending, standing, squatting, stairs, transfers, toileting, dressing, and locomotion level  PARTICIPATION LIMITATIONS: driving, community activity, and yard work  PERSONAL FACTORS Fitness and Past/current experiences are also affecting patient's functional outcome.   REHAB POTENTIAL: Good  CLINICAL DECISION MAKING: Stable/uncomplicated  EVALUATION COMPLEXITY: Low   GOALS: Goals reviewed with patient? Yes  SHORT TERM GOALS: Target date: 08/10/22 Pt. Independent with HEP to increase B hip strength 1/2 muscle grade to improve standing/walking mod. Independence.   Baseline:  see above.  L hip flexion 4/5 MMT.  Goal status: Partially  met    LONG TERM GOALS: Target date: 08/26/22  Pt. Will increase FOTO to 59 to improve functional mobility.   Baseline:  initial FOTO 40.  11/8: 68 Goal status: Goal met  2.  Pt. Able to ascend/descend 13 stairs with R handrail and consistent step pattern to improve pts. Ability to go to 2nd floor of home.  11/8: recip. Gait on stairs outside with R handrail for safety.  Baseline:  Goal status: Goal met  3.  Pt. Will ambulate with mod. I and SPC with consistent 2-point gait pattern and no loss of balance to improve independence at home/ community.   Baseline:  11/8: pt. Ambulates community distances Goal status: Goal met  4.  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    Baseline:  pt. Requires extra time/ SPC while walking outside and limited with carrying  5.  Pt. Able to return to hitting golf balls/ playing round of golf with no limitations safely.    Baseline:  pt. Demonstrates chipping golf balls in gym with SBA/CGA    PLAN: PT FREQUENCY: 2x/week  PT DURATION: 4 weeks  PLANNED INTERVENTIONS: Therapeutic exercises, Therapeutic activity, Neuromuscular re-education, Balance training, Gait training, Patient/Family education, Self Care, Joint mobilization, Stair training, Prosthetic training, Manual therapy, and Re-evaluation  PLAN FOR NEXT SESSION: Progress gait with least assistive device.  Simulate golf swing.  RECERT/CHECK GOALS NEXT TX SESSION  Pura Spice, PT, DPT # [phone] 08/25/2022, 8:23 AM

## 2022-08-26 ENCOUNTER — Telehealth: Payer: Self-pay

## 2022-08-26 NOTE — Telephone Encounter (Signed)
     Patient  visit on 12/4  at [facility]   Have you been able to follow up with your primary care physician? Yes   The patient was or was not able to obtain any needed medicine or equipment. Yes   Are there diet recommendations that you are having difficulty following? Na   Patient expresses understanding of discharge instructions and education provided has no other needs at this time.  Yes      [place], [facility], Care Management  [phone] [address], [place], [place] [address] Phone: [phone] Email: [email or web address]

## 2022-08-28 ENCOUNTER — Encounter (INDEPENDENT_AMBULATORY_CARE_PROVIDER_SITE_OTHER): Payer: Self-pay | Admitting: Nurse Practitioner

## 2022-08-28 NOTE — Progress Notes (Signed)
Subjective:    Patient ID: Phillip [name], male    DOB: 01/31/1946, 76 y.o.   MRN: [id number] Chief Complaint  Patient presents with   Follow-up    3 month ivc discussion     Phillip [name] is a 76 year old male who returns today for follow-up evaluation after multiple vascular events following a recent hospitalization.  The patient underwent bladder surgery for which she had to stop his Eliquis for several days prior.  He presented to [facility] with a ischemic left foot.  He underwent intervention on 03/11/2022 with subsequent reintervention due to restenosis on 03/13/2022.  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  Shortly following his amputation the patient developed bilateral DVTs with subsequent pulmonary embolism with pulmonary thrombectomy on 03/18/2022.  Due to the clot burden he underwent IVC placement on 03/19/2022.  The patient subsequently had a worsening clot development and underwent additional pulmonary thrombectomy on 03/23/2022.    The patient has since been fitted for his prosthesis.  He is ambulating well.  He currently does not use a cane.  He does note some swelling in his left lower extremity but the foot is warm with palpable pulses.  He has been diligent with his anticoagulation.    Review of Systems  Cardiovascular:  Positive for leg swelling.  All other systems reviewed and are negative.      Objective:   Physical Exam Vitals reviewed.  HENT:     Head: Normocephalic.  Cardiovascular:     Rate and Rhythm: Normal rate.     Pulses: Normal pulses.  Pulmonary:     Effort: Pulmonary effort is normal.  Musculoskeletal:     Right Lower Extremity: Right leg is amputated below knee.  Skin:    General: Skin is warm and dry.  Neurological:     Mental Status: He is alert and oriented to person, place, and time.  Psychiatric:        Mood and  Affect: Mood normal.        Behavior: Behavior normal.        Thought Content: Thought content normal.        Judgment: Judgment normal.     BP 134/78 (BP Location: Left Arm)   Pulse 63   Resp 16   Wt 168 lb (76.2 kg)   BMI 25.54 kg/m   Past Medical History:  Diagnosis Date   Arthritis    Basal cell carcinoma    L clavicle, L prox forearm- removed years ago    Bladder cancer (HCC)    Bladder neck contracture    CAD (coronary artery disease)    a. 12/2020 NSTEMI/Cath: LM nl, LAD 95ost/p, LCX 50p, RCA nl. EF 45-50%.   Cataract    CKD (chronic kidney disease), stage III [facility])    ED (erectile dysfunction)    Frequency    GERD (gastroesophageal reflux disease)    History of pulmonary embolus (PE) 11/2018   a. Following LE DVT-->Chronic warfarin.   Hyperlipidemia LDL goal <70    Hypertension    Hypothyroidism    Incontinence of urine    sui, s/p cryoablation   Ischemic cardiomyopathy    a. 11/2018 Echo: EF 60-65%; b. 12/2020 LV Gram: EF 45-50% in setting of NSTEMI.   Kidney stones    Neuropathy    feet   Nocturia    Prostate cancer (HCC)    S/P  CRYOABLATION   Right elbow tendinitis    Right Lower Extremity DVT (deep venous thrombosis) ([place]) 11/25/2018   Vertigo    1-2x/yr   Wears glasses     Social History   Socioeconomic History   Marital status: Married    Spouse name: Shawnta Zimbelman   Number of children: 3   Years of education: Not on file   Highest education level: Not on file  Occupational History   Not on file  Tobacco Use   Smoking status: Never    Passive exposure: Never   Smokeless tobacco: Never  Vaping Use   Vaping Use: Never used  Substance and Sexual Activity   Alcohol use: No   Drug use: No   Sexual activity: Not Currently    Birth control/protection: None  Other Topics Concern   Not on file  Social History Narrative   Lives locally with wife.  Exercises regularly - gym/golf.  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   3 sons: 1 is a Marine scientist.    Social Determinants of Health   Financial Resource Strain: Not on file  Food Insecurity: Not on file  Transportation Needs: Not on file  Physical Activity: Not on file  Stress: Not on file  Social Connections: Not on file  Intimate Partner Violence: Not on file    Past Surgical History:  Procedure Laterality Date   AMPUTATION Left 03/16/2022   Procedure: AMPUTATION BELOW KNEE;  Surgeon: Katha Cabal, MD;  Location: [facility];  Service: Vascular;  Laterality: Left;   ARTHROPLASTY AND TENDON REPAIR LEFT THUMB  JAN 2014   CATARACT EXTRACTION W/PHACO Left 11/16/2020   Procedure: CATARACT EXTRACTION PHACO AND INTRAOCULAR LENS PLACEMENT (IOC) LEFT  7.09 00:56.4 12.6%;  Surgeon: Leandrew Koyanagi, MD;  Location: [place];  Service: Ophthalmology;  Laterality: Left;   CATARACT EXTRACTION W/PHACO Right 12/02/2020   Procedure: CATARACT EXTRACTION PHACO AND INTRAOCULAR LENS PLACEMENT (IOC) RIGHT MALYUGIN 5.52 00:49.7 11.1%;  Surgeon: Leandrew Koyanagi, MD;  Location: [place];  Service: Ophthalmology;  Laterality: Right;   COLONOSCOPY     COLONOSCOPY WITH PROPOFOL N/A 08/09/2017   Procedure: COLONOSCOPY WITH PROPOFOL;  Surgeon: Manya Silvas, MD;  Location: [facility];  Service: Endoscopy;  Laterality: N/A;   CORONARY ANGIOGRAPHY N/A 12/28/2020   Procedure: CORONARY ANGIOGRAPHY;  Surgeon: Sherren Mocha, MD;  Location: [place] [facility];  Service: Cardiovascular;  Laterality: N/A;   CORONARY STENT INTERVENTION N/A 12/28/2020   Procedure: CORONARY STENT INTERVENTION;  Surgeon: Sherren Mocha, MD;  Location: [place] [facility];  Service: Cardiovascular;  Laterality: N/A;   ESOPHAGOGASTRODUODENOSCOPY (EGD) WITH PROPOFOL N/A 08/09/2017   Procedure: ESOPHAGOGASTRODUODENOSCOPY (EGD) WITH PROPOFOL;  Surgeon: Manya Silvas, MD;  Location: [facility];  Service: Endoscopy;  Laterality: N/A;   GREEN LIGHT LASER TURP  (TRANSURETHRAL RESECTION OF PROSTATE N/A 12/14/2015   Procedure: GREEN LIGHT LASER TURP (TRANSURETHRAL RESECTION OF PROSTATE) LASER OF BLADDER NECK CONTRACTURE;  Surgeon: Carolan Clines, MD;  Location: [place];  Service: Urology;  Laterality: N/A;   IVC FILTER INSERTION N/A 03/19/2022   Procedure: IVC FILTER INSERTION;  Surgeon: Serafina Mitchell, MD;  Location: [place] [facility];  Service: Cardiovascular;  Laterality: N/A;   LEFT HEART CATH AND CORONARY ANGIOGRAPHY N/A 12/24/2020   Procedure: LEFT HEART CATH AND CORONARY ANGIOGRAPHY and possible PCI and stent;  Surgeon: Yolonda Kida, MD;  Location: [place] [facility];  Service: Cardiovascular;  Laterality:  N/A;   LEFT HEART CATH AND CORONARY ANGIOGRAPHY N/A 01/06/2021   Procedure: LEFT HEART CATH AND CORONARY ANGIOGRAPHY;  Surgeon: Troy Sine, MD;  Location: [place] [facility];  Service: Cardiovascular;  Laterality: N/A;   LOWER EXTREMITY ANGIOGRAPHY Left 03/11/2022   Procedure: Lower Extremity Angiography;  Surgeon: Katha Cabal, MD;  Location: [place] [facility];  Service: Cardiovascular;  Laterality: Left;   PENILE PROSTHESIS IMPLANT N/A 12/06/2013   Procedure: PENILE PROTHESIS INFLATABLE;  Surgeon: Ailene Rud, MD;  Location: [facility];  Service: Urology;  Laterality: N/A;   PROSTATE CRYOABLATION  06-01-2012  [facility]   PULMONARY THROMBECTOMY N/A 03/18/2022   Procedure: PULMONARY THROMBECTOMY;  Surgeon: Algernon Huxley, MD;  Location: [place] [facility];  Service: Cardiovascular;  Laterality: N/A;   PULMONARY THROMBECTOMY Bilateral 03/23/2022   Procedure: PULMONARY THROMBECTOMY;  Surgeon: Katha Cabal, MD;  Location: [place] [facility];  Service: Cardiovascular;  Laterality: Bilateral;   THROMBECTOMY ILIAC ARTERY Left 03/12/2022   Procedure: THROMBECTOMY LEG;  Surgeon: Conrad Youngstown, MD;  Location: [facility];  Service: Vascular;  Laterality: Left;   TRANSURETHRAL RESECTION OF  BLADDER NECK N/A 03/20/2015   Procedure: RELEASE BLADDER NECK CONTRACTURE  WITH WOLF BUTTON ELECTRODE ;  Surgeon: Carolan Clines, MD;  Location: [place];  Service: Urology;  Laterality: N/A;   TRANSURETHRAL RESECTION OF BLADDER TUMOR N/A 12/05/2018   Procedure: TRANSURETHRAL RESECTION OF BLADDER TUMOR (TURBT)/CYSTOSCOPY/  INSTILLATION OF Rodell Perna;  Surgeon: Ceasar Mons, MD;  Location: [facility];  Service: Urology;  Laterality: N/A;   TRANSURETHRAL RESECTION OF BLADDER TUMOR N/A 01/15/2020   Procedure: TRANSURETHRAL RESECTION OF BLADDER TUMOR (TURBT)/ CYSTOSCOPY;  Surgeon: Ceasar Mons, MD;  Location: [facility];  Service: Urology;  Laterality: N/A;   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      Family History  Problem Relation Age of Onset   CAD Father 38    Allergies  Allergen Reactions   Doxazosin Other (See Comments)    Other reaction(s): Unknown       Latest Ref Rng & Units 08/22/2022    4:45 AM 08/01/2022   12:54 PM 07/20/2022    9:39 AM  CBC  WBC 4.0 - 10.5 K/uL 8.7  8.7  7.1   Hemoglobin 13.0 - 17.0 g/dL 14.7  13.5  14.2   Hematocrit 39.0 - 52.0 % 46.3  43.4  46.8   Platelets 150 - 400 K/uL 340  420  475       CMP     Component Value Date/Time   NA 142 08/22/2022 0445   NA 141 10/25/2014 2206   K 3.6 08/22/2022 0445   K 4.0 10/25/2014 2206   CL 110 08/22/2022 0445   CL 109 (H) 10/25/2014 2206   CO2 29 08/22/2022 0445   CO2 26 10/25/2014 2206   GLUCOSE 118 (H) 08/22/2022 0445   GLUCOSE 210 (H) 10/25/2014 2206   BUN 20 08/22/2022 0445   BUN 21 08/15/2016 1618   BUN 15 10/25/2014 2206   CREATININE 1.29 (H) 08/22/2022 0445   CREATININE 1.32 (H) 10/25/2014 2206   CALCIUM 9.1 08/22/2022 0445   CALCIUM 8.2 (L) 10/25/2014 2206   PROT 7.3 08/22/2022 0445   PROT 7.0 10/25/2014 2206   ALBUMIN 3.8 08/22/2022 0445   ALBUMIN 3.2 (L) 10/25/2014 2206   AST 20  08/22/2022 0445   AST 20 10/25/2014 2206   ALT  20 08/22/2022 0445   ALT 22 10/25/2014 2206   ALKPHOS 47 08/22/2022 0445   ALKPHOS 58 10/25/2014 2206   BILITOT 0.8 08/22/2022 0445   BILITOT 0.2 10/25/2014 2206   GFRNONAA 58 (L) 08/22/2022 0445   GFRNONAA 58 (L) 10/25/2014 2206   GFRAA >60 03/27/2019 0637   GFRAA >60 10/25/2014 2206     No results found.     Assessment & Plan:   1. Hx of BKA, left (HCC) Currently the patient is ambulating doing well with his prosthetic.  2. Deep vein thrombosis (DVT) of proximal vein of both lower extremities, unspecified chronicity (HCC) Swelling in the left lower extremity is likely related to postphlebitic changes.  We discussed that in instances where patients have had DVTs swelling has a common occurrence at times.  Warning signs to be concerned with this sudden painful swelling that is abrupt.  Versus the gradual swelling that comes and goes daily.  Recommended to utilize medical compression stockings daily  Following discussion with the patient we will plan to move forward with IVC filter removal.  We have discussed the risk benefits and alternatives and the patient is agreeable to proceed.  The patient will follow-up in office postintervention.  3. Primary hypertension Continue antihypertensive medications as already ordered, these medications have been reviewed and there are no changes at this time.   Current Outpatient Medications on File Prior to Visit  Medication Sig Dispense Refill   acetaminophen (TYLENOL) 500 MG tablet Take 500 mg by mouth every 6 (six) hours as needed.     ascorbic acid (VITAMIN C) 500 MG tablet Take 500 mg by mouth daily.     cetirizine (ZYRTEC) 10 MG tablet Take 10 mg by mouth daily.     clopidogrel (PLAVIX) 75 MG tablet Take 1 tablet (75 mg total) by mouth daily with breakfast. 90 tablet 3   co-enzyme Q-10 30 MG capsule Take 30 mg by mouth daily.     hydrochlorothiazide (HYDRODIURIL) 25 MG tablet Take 25 mg by  mouth daily as needed.     hydroxyurea (HYDREA) 500 MG capsule Take 1 capsule (500 mg total) by mouth daily. May take with food to minimize GI side effects. 90 capsule 1   Iron-Vitamin C (VITRON-C) 65-125 MG TABS Take 1 tablet by mouth daily. 90 tablet 1   metoprolol tartrate (LOPRESSOR) 25 MG tablet Take 0.5 tablets (12.5 mg total) by mouth 2 (two) times daily. 60 tablet 0   Multiple Vitamin (MULTIVITAMIN) tablet Take 1 tablet by mouth daily.     nitroGLYCERIN (NITROSTAT) 0.4 MG SL tablet Place 1 tablet (0.4 mg total) under the tongue every 5 (five) minutes x 3 doses as needed for chest pain. 25 tablet 3   pantoprazole (PROTONIX) 40 MG tablet Take 1 tablet (40 mg total) by mouth daily. 90 tablet 3   pravastatin (PRAVACHOL) 40 MG tablet Take 40 mg by mouth daily.     traMADol (ULTRAM) 50 MG tablet Take 50 mg by mouth every 6 (six) hours as needed for mild pain.     TRUE METRIX BLOOD GLUCOSE TEST test strip SMARTSIG:Via Meter     Turmeric (QC TUMERIC COMPLEX PO) Take by mouth.     apixaban (ELIQUIS) 5 MG TABS tablet Take 1 tablet (5 mg total) by mouth 2 (two) times daily. 60 tablet 0   levothyroxine (SYNTHROID) 50 MCG tablet Take 50 mcg by mouth.     No current facility-administered medications on file prior to visit.  There are no Patient Instructions on file for this visit. No follow-ups on file.   Kris Hartmann, NP

## 2022-08-30 ENCOUNTER — Ambulatory Visit: Payer: Medicare HMO | Admitting: Physical Therapy

## 2022-08-30 ENCOUNTER — Encounter: Payer: Medicare HMO | Admitting: Physical Therapy

## 2022-08-30 DIAGNOSIS — R269 Unspecified abnormalities of gait and mobility: Secondary | ICD-10-CM

## 2022-08-30 DIAGNOSIS — M6281 Muscle weakness (generalized): Secondary | ICD-10-CM

## 2022-08-30 DIAGNOSIS — Z89512 Acquired absence of left leg below knee: Secondary | ICD-10-CM

## 2022-08-30 NOTE — Therapy (Signed)
OUTPATIENT PHYSICAL THERAPY LOWER EXTREMITY TREATMENT/ RECERTIFICATION  Patient Name: Phillip [name] MRN: [id number] DOB:[birth year], 76 y.o., male Today's Date: 08/30/2022   PT End of Session - 08/30/22 0852     Visit Number 14    Number of Visits 30    Date for PT Re-Evaluation 10/25/22    PT Start Time 0855    PT Stop Time 0948    PT Time Calculation (min) 53 min             Past Medical History:  Diagnosis Date   Arthritis    Basal cell carcinoma    L clavicle, L prox forearm- removed years ago    Bladder cancer [facility])    Bladder neck contracture    CAD (coronary artery disease)    a. 12/2020 NSTEMI/Cath: LM nl, LAD 95ost/p, LCX 50p, RCA nl. EF 45-50%.   Cataract    CKD (chronic kidney disease), stage III [facility])    ED (erectile dysfunction)    Frequency    GERD (gastroesophageal reflux disease)    History of pulmonary embolus (PE) 11/2018   a. Following LE DVT-->Chronic warfarin.   Hyperlipidemia LDL goal <70    Hypertension    Hypothyroidism    Incontinence of urine    sui, s/p cryoablation   Ischemic cardiomyopathy    a. 11/2018 Echo: EF 60-65%; b. 12/2020 LV Gram: EF 45-50% in setting of NSTEMI.   Kidney stones    Neuropathy    feet   Nocturia    Prostate cancer ([place])    S/P   CRYOABLATION   Right elbow tendinitis    Right Lower Extremity DVT (deep venous thrombosis) ([place]) 11/25/2018   Vertigo    1-2x/yr   Wears glasses    Past Surgical History:  Procedure Laterality Date   AMPUTATION Left 03/16/2022   Procedure: AMPUTATION BELOW KNEE;  Surgeon: Katha Cabal, MD;  Location: [facility];  Service: Vascular;  Laterality: Left;   ARTHROPLASTY AND TENDON REPAIR LEFT THUMB  JAN 2014   CATARACT EXTRACTION W/PHACO Left 11/16/2020   Procedure: CATARACT EXTRACTION PHACO AND INTRAOCULAR LENS PLACEMENT (IOC) LEFT  7.09 00:56.4 12.6%;  Surgeon: Leandrew Koyanagi, MD;  Location: [place];  Service: Ophthalmology;  Laterality: Left;   CATARACT  EXTRACTION W/PHACO Right 12/02/2020   Procedure: CATARACT EXTRACTION PHACO AND INTRAOCULAR LENS PLACEMENT (IOC) RIGHT MALYUGIN 5.52 00:49.7 11.1%;  Surgeon: Leandrew Koyanagi, MD;  Location: [place];  Service: Ophthalmology;  Laterality: Right;   COLONOSCOPY     COLONOSCOPY WITH PROPOFOL N/A 08/09/2017   Procedure: COLONOSCOPY WITH PROPOFOL;  Surgeon: Manya Silvas, MD;  Location: [facility]OSCOPY;  Service: Endoscopy;  Laterality: N/A;   CORONARY ANGIOGRAPHY N/A 12/28/2020   Procedure: CORONARY ANGIOGRAPHY;  Surgeon: Sherren Mocha, MD;  Location: [place] CV LAB;  Service: Cardiovascular;  Laterality: N/A;   CORONARY STENT INTERVENTION N/A 12/28/2020   Procedure: CORONARY STENT INTERVENTION;  Surgeon: Sherren Mocha, MD;  Location: [place] [facility];  Service: Cardiovascular;  Laterality: N/A;   ESOPHAGOGASTRODUODENOSCOPY (EGD) WITH PROPOFOL N/A 08/09/2017   Procedure: ESOPHAGOGASTRODUODENOSCOPY (EGD) WITH PROPOFOL;  Surgeon: Manya Silvas, MD;  Location: [facility] END[facility]COPY;  Service: Endoscopy;  Laterality: N/A;   GREEN LIGHT LASER TURP (TRANSURETHRAL RESECTION OF PROSTATE N/A 12/14/2015   Procedure: GREEN LIGHT LASER TURP (TRANSURETHRAL RESECTION OF PROSTATE) LASER OF BLADDER NECK CONTRACTURE;  Surgeon: Carolan Clines, MD;  Location: [place];  Service: Urology;  Laterality: N/A;   IVC FILTER INSERTION N/A  03/19/2022   Procedure: IVC FILTER INSERTION;  Surgeon: Serafina Mitchell, MD;  Location: [place] [facility];  Service: Cardiovascular;  Laterality: N/A;   LEFT HEART CATH AND CORONARY ANGIOGRAPHY N/A 12/24/2020   Procedure: LEFT HEART CATH AND CORONARY ANGIOGRAPHY and possible PCI and stent;  Surgeon: Yolonda Kida, MD;  Location: [place] [facility];  Service: Cardiovascular;  Laterality: N/A;   LEFT HEART CATH AND CORONARY ANGIOGRAPHY N/A 01/06/2021   Procedure: LEFT HEART CATH AND CORONARY ANGIOGRAPHY;  Surgeon: Troy Sine, MD;  Location: [place] [facility];  Service: Cardiovascular;  Laterality: N/A;   LOWER EXTREMITY ANGIOGRAPHY Left 03/11/2022   Procedure: Lower Extremity Angiography;  Surgeon: Katha Cabal, MD;  Location: [place] [facility];  Service: Cardiovascular;  Laterality: Left;   PENILE PROSTHESIS IMPLANT N/A 12/06/2013   Procedure: PENILE PROTHESIS INFLATABLE;  Surgeon: Ailene Rud, MD;  Location: [facility];  Service: Urology;  Laterality: N/A;   PROSTATE CRYOABLATION  06-01-2012  [facility]   PULMONARY THROMBECTOMY N/A 03/18/2022   Procedure: PULMONARY THROMBECTOMY;  Surgeon: Algernon Huxley, MD;  Location: [place] [facility];  Service: Cardiovascular;  Laterality: N/A;   PULMONARY THROMBECTOMY Bilateral 03/23/2022   Procedure: PULMONARY THROMBECTOMY;  Surgeon: Katha Cabal, MD;  Location: [place] [facility];  Service: Cardiovascular;  Laterality: Bilateral;   THROMBECTOMY ILIAC ARTERY Left 03/12/2022   Procedure: THROMBECTOMY LEG;  Surgeon: Conrad McFall, MD;  Location: [facility];  Service: Vascular;  Laterality: Left;   TRANSURETHRAL RESECTION OF BLADDER NECK N/A 03/20/2015   Procedure: RELEASE BLADDER NECK CONTRACTURE  WITH WOLF BUTTON ELECTRODE ;  Surgeon: Carolan Clines, MD;  Location: [place];  Service: Urology;  Laterality: N/A;   TRANSURETHRAL RESECTION OF BLADDER TUMOR N/A 12/05/2018   Procedure: TRANSURETHRAL RESECTION OF BLADDER TUMOR (TURBT)/CYSTOSCOPY/  INSTILLATION OF Rodell Perna;  Surgeon: Ceasar Mons, MD;  Location: [facility];  Service: Urology;  Laterality: N/A;   TRANSURETHRAL RESECTION OF BLADDER TUMOR N/A 01/15/2020   Procedure: TRANSURETHRAL RESECTION OF BLADDER TUMOR (TURBT)/ CYSTOSCOPY;  Surgeon: Ceasar Mons, MD;  Location: [facility];  Service: Urology;  Laterality: N/A;   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     Patient Active Problem List   Diagnosis Date Noted    Iron deficiency anemia 06/20/2022   Myeloproliferative disorder ([place]) 05/19/2022   History of bladder cancer 05/19/2022   Goals of care, counseling/discussion 03/23/2022   Reactive thrombocytosis 03/21/2022   Paroxysmal atrial fibrillation with RVR ([place]) 03/20/2022   Acute bilateral deep vein thrombosis (DVT) of femoral veins (HCC) 03/20/2022   Acute respiratory failure with hypoxia (HCC) 03/16/2022   Acute on chronic diastolic CHF (congestive heart failure) ([place]) 03/16/2022   PSVT (paroxysmal supraventricular tachycardia) 03/16/2022   AKI (acute kidney injury) ([place]) 03/15/2022   Critical limb ischemia of left lower extremity with ulceration of lower leg ([place]) 03/15/2022   Arterial occlusion 03/11/2022   Elevated troponin    Unstable angina ([place]) 01/05/2021   Hyperlipidemia LDL goal <70    CKD (chronic kidney disease), stage III (HCC)    Coronary artery disease    NSTEMI (non-ST elevated myocardial infarction) ([place]) 12/23/2020   Acquired hypothyroidism 04/20/2020   Chemotherapy-induced neuropathy ([place]) 04/20/2020   Nonthrombocytopenic purpura ([place]) 12/03/2019   Hypercoagulable state ([place]) 12/10/2018   Recurrent deep vein thrombosis (DVT) ([place])    Bilateral pulmonary embolism ([place]) 12/07/2018   GERD (gastroesophageal reflux  disease) 12/07/2018   HTN (hypertension) 12/07/2018   HLD (hyperlipidemia) 12/07/2018   Prostate cancer ([place]) 12/07/2018   Bladder cancer ([place]) 12/07/2018   DM type 2 with diabetic mixed hyperlipidemia ([place]) 04/04/2018   Pain in right hand 10/04/2017   Tubular adenoma 16/06/9603   Helicobacter pylori (H. pylori) infection 09/04/2017   Medicare annual wellness visit, initial 03/31/2017   History of prostate cancer 10/27/2014   Lower urinary tract symptoms (LUTS) 03/18/2012   Nephrolithiasis 03/18/2012    PCP: Rusty Aus, MD  REFERRING PROVIDER: Kris Hartmann, NP  REFERRING DIAG: Left below knee amputee  THERAPY DIAG:  Gait difficulty  Muscle  weakness (generalized)  Left below-knee amputee [facility])  Rationale for Evaluation and Treatment Rehabilitation  ONSET DATE: 03/16/22  SUBJECTIVE:   SUBJECTIVE STATEMENT:   EVALUATION Pt. S/p R BKA after blood clots in lower leg.  Pt. Reports minimum phantom limb symptoms and occasional muscle spasms.  Pt. Takes Baclofen to manage symptoms.  Pt. Received prosthetic leg on Wednesday from [facility].  Pt. Has h/o low back pain and has received spinal epidurals in past (2 years ago).    PERTINENT HISTORY: 1. Hx of BKA, left [facility]) Phillip [name] was seen for further evaluation for left below knee prosthesis.  The is a 76 year old male who had amputation on 03/16/2022.  At the time he is well healed and ready for fitting of new below knee prosthesis.  He is a highly motivated individual and should do well once fitted with prosthesis.  She has no problem returning to a K2 ambulator, which will allow him to walk inside and outside of his home and overload level barriers.    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  He will be having a bone marrow biopsy.  We will continue to have the patient keep his IVC filter for now as there may be further test.  We will have the patient return in 3 months to discuss filter removal.    PAIN:  Are you having pain? No  PRECAUTIONS: None  WEIGHT BEARING RESTRICTIONS No  FALLS:  Has patient fallen in last 6 months? No  LIVING ENVIRONMENT: Lives with: lives with their spouse Lives in: House/apartment Stairs: Yes: Internal: 13+ steps; on right going up Has following equipment at home: Single point cane and Walker - 2 wheeled  OCCUPATION: Retired  PLOF: [place] with walking and return to playing golf by spring.     OBJECTIVE:   PATIENT SURVEYS:  FOTO initial 40/ goal 39.    COGNITION:  Overall cognitive status: Within functional limits for tasks  assessed     SENSATION: WFL  EDEMA:  Circumferential: L/R knee joint line (34/36 cm.), calf (30.5/ 33 cm), distal quad (35/36 cm).    POSTURE: rounded shoulders and forward head  PALPATION: No tenderness/ good incision healing on residual limb  LOWER EXTREMITY ROM:  B LE WFL (all planes).  L knee 0-130 deg.   LOWER EXTREMITY MMT:  R LE muscle strength grossly 5/5 MMT except hip flexion 4+/5 MMT.  L LE strength grossly 5/5 MMT except hip flexion 4/5 MMT and hip abduction 4+/5 MMT.    FUNCTIONAL TESTS:  5 times sit to stand: 14.1 sec. And requires R UE assist.  Able to stand from chair with addition of 2" Airex pad.    GAIT: Distance walked: in clinic/ //-bars Assistive device utilized: Environmental consultant - 2 wheeled Level  of assistance: Min A Comments: Ambulates in clinic with RW and min. A/ cuing for proper technique and step pattern.  Pt. Able to ambulate in //-bars with R UE assist only and 2-point gait pattern.    11/27:  5xSTS: 13.04 sec. (No UE assist).    TODAY'S TREATMENT:  08/30/22:  Subjective: Pt. Has continued to go to [facility] for bike/ leg press/ UE ex.  Pt. States he tried the TM and did well.    There.ex.:  Walking in hallway (3 laps) working on consistent L heel strike/ step length/ increase cadence.  No LOB.  Good turning.    Ascend/ descend 4 stairs x 4 with use of R UE on handrail and recip. Pattern.  Focus on L knee/quad eccentric muscle control while descending step.      Nustep L5 10 min. B LE only (consistent cadence). Discussed [employer] ex. [facility]estic this past weekend).     Resisted gait 2BTB:  5x forward/backwards/lateral (cuing for consistent step pattern and BOS).    SBA/CGA for safety and verbal cuing.  Difficulty with lateral walking and requires light UE assist for safety.    R LE muscle strength grossly 5/5 MMT except hip flexion 4+/5 MMT.  L LE strength grossly 5/5 MMT except hip flexion 4/5 MMT and hip abduction 4+/5 MMT.  No significant changes to hip flexor  strength noted.   BOSU step ups with L LE 12x (requires UE assist).      Reassessment of PT goals.    Walking outside on varying terrain/ grass without use of SPC.     PATIENT EDUCATION:  Education details: Reviewed HEP Person educated: Patient Education method: Explanation, Demonstration, and Handouts Education comprehension: verbalized understanding and returned demonstration   HOME EXERCISE PROGRAM: See handouts.   ASSESSMENT:  CLINICAL IMPRESSION: Patient is highly motivated and hard working during standing/ walking tasks today.  Pt. Walking more consistently around PT clinic without use of SPC and recip. Gait pattern.   PT discussed POC with pt. And emphasized walking with consistent L heel strike/ knee extension.  Pt. Will benefit from skilled PT services to increase LE muscle strength to improve independence with walking/ return to playing golf.       OBJECTIVE IMPAIRMENTS Abnormal gait, decreased activity tolerance, decreased balance, decreased endurance, decreased mobility, difficulty walking, decreased strength, impaired flexibility, improper body mechanics, postural dysfunction, and pain.   ACTIVITY LIMITATIONS carrying, lifting, bending, standing, squatting, stairs, transfers, toileting, dressing, and locomotion level  PARTICIPATION LIMITATIONS: driving, community activity, and yard work  PERSONAL FACTORS Fitness and Past/current experiences are also affecting patient's functional outcome.   REHAB POTENTIAL: Good  CLINICAL DECISION MAKING: Stable/uncomplicated  EVALUATION COMPLEXITY: Low   GOALS: Goals reviewed with patient? Yes  SHORT TERM GOALS: Target date: 09/27/22 Pt. Independent with HEP to increase B hip strength 1/2 muscle grade to improve standing/walking mod. Independence.   Baseline:  see above.  L hip flexion 4/5 MMT.  Goal status: Partially met    LONG TERM GOALS: Target date: 10/25/22  Pt. Will increase FOTO to 59 to improve functional  mobility.   Baseline:  initial FOTO 40.  11/8: 68 Goal status: Goal met  2.  Pt. Able to ascend/descend 13 stairs with R handrail and consistent step pattern to improve pts. Ability to go to 2nd floor of home.  11/8: recip. Gait on stairs outside with R handrail for safety.   Baseline:  Goal status: Goal met  3.  Pt. Will ambulate with mod.  I and SPC with consistent 2-point gait pattern and no loss of balance to improve independence at home/ community.   Baseline:  11/8: pt. Ambulates community distances Goal status: Goal met  4.  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    Baseline:  pt. Requires extra time/ SPC while walking outside and limited with carrying  5.  Pt. Able to return to hitting golf balls/ playing round of golf with no limitations safely.    Baseline:  pt. Demonstrates chipping golf balls in gym with SBA/CGA    PLAN: PT FREQUENCY: 2x/week  PT DURATION: 8 weeks  PLANNED INTERVENTIONS: Therapeutic exercises, Therapeutic activity, Neuromuscular re-education, Balance training, Gait training, Patient/Family education, Self Care, Joint mobilization, Stair training, Prosthetic training, Manual therapy, and Re-evaluation  PLAN FOR NEXT SESSION: Progress gait with least assistive device.  Simulate golf swing.    Pura Spice, PT, DPT # [phone] 08/30/2022, 9:51 AM

## 2022-08-31 ENCOUNTER — Inpatient Hospital Stay: Payer: Medicare HMO | Attending: Oncology

## 2022-08-31 ENCOUNTER — Encounter: Payer: Self-pay | Admitting: Oncology

## 2022-08-31 DIAGNOSIS — D471 Chronic myeloproliferative disease: Secondary | ICD-10-CM | POA: Diagnosis not present

## 2022-08-31 DIAGNOSIS — Z86711 Personal history of pulmonary embolism: Secondary | ICD-10-CM | POA: Diagnosis not present

## 2022-08-31 DIAGNOSIS — I82409 Acute embolism and thrombosis of unspecified deep veins of unspecified lower extremity: Secondary | ICD-10-CM

## 2022-08-31 DIAGNOSIS — Z86718 Personal history of other venous thrombosis and embolism: Secondary | ICD-10-CM | POA: Diagnosis not present

## 2022-08-31 LAB — CBC WITH DIFFERENTIAL/PLATELET
Abs Immature Granulocytes: 0.02 10*3/uL (ref 0.00–0.07)
Basophils Absolute: 0.1 10*3/uL (ref 0.0–0.1)
Basophils Relative: 1 %
Eosinophils Absolute: 0.2 10*3/uL (ref 0.0–0.5)
Eosinophils Relative: 2 %
HCT: 46.5 % (ref 39.0–52.0)
Hemoglobin: 14.6 g/dL (ref 13.0–17.0)
Immature Granulocytes: 0 %
Lymphocytes Relative: 22 %
Lymphs Abs: 1.5 10*3/uL (ref 0.7–4.0)
MCH: 29.8 pg (ref 26.0–34.0)
MCHC: 31.4 g/dL (ref 30.0–36.0)
MCV: 94.9 fL (ref 80.0–100.0)
Monocytes Absolute: 0.4 10*3/uL (ref 0.1–1.0)
Monocytes Relative: 6 %
Neutro Abs: 4.5 10*3/uL (ref 1.7–7.7)
Neutrophils Relative %: 69 %
Platelets: 315 10*3/uL (ref 150–400)
RBC: 4.9 MIL/uL (ref 4.22–5.81)
RDW: 18.7 % — ABNORMAL HIGH (ref 11.5–15.5)
WBC: 6.6 10*3/uL (ref 4.0–10.5)
nRBC: 0 % (ref 0.0–0.2)

## 2022-09-02 ENCOUNTER — Encounter: Payer: Self-pay | Admitting: Physical Therapy

## 2022-09-02 ENCOUNTER — Ambulatory Visit: Payer: Medicare HMO | Admitting: Physical Therapy

## 2022-09-02 DIAGNOSIS — R269 Unspecified abnormalities of gait and mobility: Secondary | ICD-10-CM

## 2022-09-02 DIAGNOSIS — M6281 Muscle weakness (generalized): Secondary | ICD-10-CM | POA: Diagnosis not present

## 2022-09-02 DIAGNOSIS — Z89512 Acquired absence of left leg below knee: Secondary | ICD-10-CM

## 2022-09-02 NOTE — Therapy (Signed)
OUTPATIENT PHYSICAL THERAPY LOWER EXTREMITY TREATMENT  Patient Name: Phillip [name] MRN: [id number] DOB:11-01-1945, 76 y.o., male Today's Date: 09/02/2022   PT End of Session - 09/02/22 1002     Visit Number 15    Number of Visits 30    Date for PT Re-Evaluation 10/25/22    PT Start Time 1028    PT Stop Time 1119    PT Time Calculation (min) 51 min             Past Medical History:  Diagnosis Date   Arthritis    Basal cell carcinoma    L clavicle, L prox forearm- removed years ago    Bladder cancer ([place])    Bladder neck contracture    CAD (coronary artery disease)    a. 12/2020 NSTEMI/Cath: LM nl, LAD 95ost/p, LCX 50p, RCA nl. EF 45-50%.   Cataract    CKD (chronic kidney disease), stage III [facility])    ED (erectile dysfunction)    Frequency    GERD (gastroesophageal reflux disease)    History of pulmonary embolus (PE) 11/2018   a. Following LE DVT-->Chronic warfarin.   Hyperlipidemia LDL goal <70    Hypertension    Hypothyroidism    Incontinence of urine    sui, s/p cryoablation   Ischemic cardiomyopathy    a. 11/2018 Echo: EF 60-65%; b. 12/2020 LV Gram: EF 45-50% in setting of NSTEMI.   Kidney stones    Neuropathy    feet   Nocturia    Prostate cancer ([place])    S/P   CRYOABLATION   Right elbow tendinitis    Right Lower Extremity DVT (deep venous thrombosis) ([place]) 11/25/2018   Vertigo    1-2x/yr   Wears glasses    Past Surgical History:  Procedure Laterality Date   AMPUTATION Left 03/16/2022   Procedure: AMPUTATION BELOW KNEE;  Surgeon: Katha Cabal, MD;  Location: [facility];  Service: Vascular;  Laterality: Left;   ARTHROPLASTY AND TENDON REPAIR LEFT THUMB  JAN 2014   CATARACT EXTRACTION W/PHACO Left 11/16/2020   Procedure: CATARACT EXTRACTION PHACO AND INTRAOCULAR LENS PLACEMENT (IOC) LEFT  7.09 00:56.4 12.6%;  Surgeon: Leandrew Koyanagi, MD;  Location: [place];  Service: Ophthalmology;  Laterality: Left;   CATARACT EXTRACTION W/PHACO  Right 12/02/2020   Procedure: CATARACT EXTRACTION PHACO AND INTRAOCULAR LENS PLACEMENT (IOC) RIGHT MALYUGIN 5.52 00:49.7 11.1%;  Surgeon: Leandrew Koyanagi, MD;  Location: [place];  Service: Ophthalmology;  Laterality: Right;   COLONOSCOPY     COLONOSCOPY WITH PROPOFOL N/A 08/09/2017   Procedure: COLONOSCOPY WITH PROPOFOL;  Surgeon: Manya Silvas, MD;  Location: [facility]COPY;  Service: Endoscopy;  Laterality: N/A;   CORONARY ANGIOGRAPHY N/A 12/28/2020   Procedure: CORONARY ANGIOGRAPHY;  Surgeon: Sherren Mocha, MD;  Location: [place] [facility];  Service: Cardiovascular;  Laterality: N/A;   CORONARY STENT INTERVENTION N/A 12/28/2020   Procedure: CORONARY STENT INTERVENTION;  Surgeon: Sherren Mocha, MD;  Location: [place] [facility];  Service: Cardiovascular;  Laterality: N/A;   ESOPHAGOGASTRODUODENOSCOPY (EGD) WITH PROPOFOL N/A 08/09/2017   Procedure: ESOPHAGOGASTRODUODENOSCOPY (EGD) WITH PROPOFOL;  Surgeon: Manya Silvas, MD;  Location: [facility]COPY;  Service: Endoscopy;  Laterality: N/A;   GREEN LIGHT LASER TURP (TRANSURETHRAL RESECTION OF PROSTATE N/A 12/14/2015   Procedure: GREEN LIGHT LASER TURP (TRANSURETHRAL RESECTION OF PROSTATE) LASER OF BLADDER NECK CONTRACTURE;  Surgeon: Carolan Clines, MD;  Location: [place];  Service: Urology;  Laterality: N/A;   IVC FILTER INSERTION N/A 03/19/2022  Procedure: IVC FILTER INSERTION;  Surgeon: Serafina Mitchell, MD;  Location: [place] [facility];  Service: Cardiovascular;  Laterality: N/A;   LEFT HEART CATH AND CORONARY ANGIOGRAPHY N/A 12/24/2020   Procedure: LEFT HEART CATH AND CORONARY ANGIOGRAPHY and possible PCI and stent;  Surgeon: Yolonda Kida, MD;  Location: [place] [facility];  Service: Cardiovascular;  Laterality: N/A;   LEFT HEART CATH AND CORONARY ANGIOGRAPHY N/A 01/06/2021   Procedure: LEFT HEART CATH AND CORONARY ANGIOGRAPHY;  Surgeon: Troy Sine, MD;  Location: [place] [facility];   Service: Cardiovascular;  Laterality: N/A;   LOWER EXTREMITY ANGIOGRAPHY Left 03/11/2022   Procedure: Lower Extremity Angiography;  Surgeon: Katha Cabal, MD;  Location: [place] [facility];  Service: Cardiovascular;  Laterality: Left;   PENILE PROSTHESIS IMPLANT N/A 12/06/2013   Procedure: PENILE PROTHESIS INFLATABLE;  Surgeon: Ailene Rud, MD;  Location: [facility];  Service: Urology;  Laterality: N/A;   PROSTATE CRYOABLATION  06-01-2012  [facility]   PULMONARY THROMBECTOMY N/A 03/18/2022   Procedure: PULMONARY THROMBECTOMY;  Surgeon: Algernon Huxley, MD;  Location: [place] [facility];  Service: Cardiovascular;  Laterality: N/A;   PULMONARY THROMBECTOMY Bilateral 03/23/2022   Procedure: PULMONARY THROMBECTOMY;  Surgeon: Katha Cabal, MD;  Location: [place] [facility];  Service: Cardiovascular;  Laterality: Bilateral;   THROMBECTOMY ILIAC ARTERY Left 03/12/2022   Procedure: THROMBECTOMY LEG;  Surgeon: Conrad [place], MD;  Location: [facility];  Service: Vascular;  Laterality: Left;   TRANSURETHRAL RESECTION OF BLADDER NECK N/A 03/20/2015   Procedure: RELEASE BLADDER NECK CONTRACTURE  WITH WOLF BUTTON ELECTRODE ;  Surgeon: Carolan Clines, MD;  Location: [place];  Service: Urology;  Laterality: N/A;   TRANSURETHRAL RESECTION OF BLADDER TUMOR N/A 12/05/2018   Procedure: TRANSURETHRAL RESECTION OF BLADDER TUMOR (TURBT)/CYSTOSCOPY/  INSTILLATION OF Rodell Perna;  Surgeon: Ceasar Mons, MD;  Location: [facility];  Service: Urology;  Laterality: N/A;   TRANSURETHRAL RESECTION OF BLADDER TUMOR N/A 01/15/2020   Procedure: TRANSURETHRAL RESECTION OF BLADDER TUMOR (TURBT)/ CYSTOSCOPY;  Surgeon: Ceasar Mons, MD;  Location: [facility];  Service: Urology;  Laterality: N/A;   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     Patient Active Problem List   Diagnosis Date Noted   Iron  deficiency anemia 06/20/2022   Myeloproliferative disorder ([place]) 05/19/2022   History of bladder cancer 05/19/2022   Goals of care, counseling/discussion 03/23/2022   Reactive thrombocytosis 03/21/2022   Paroxysmal atrial fibrillation with RVR ([place]) 03/20/2022   Acute bilateral deep vein thrombosis (DVT) of femoral veins (HCC) 03/20/2022   Acute respiratory failure with hypoxia (HCC) 03/16/2022   Acute on chronic diastolic CHF (congestive heart failure) ([place]) 03/16/2022   PSVT (paroxysmal supraventricular tachycardia) 03/16/2022   AKI (acute kidney injury) ([place]) 03/15/2022   Critical limb ischemia of left lower extremity with ulceration of lower leg ([place]) 03/15/2022   Arterial occlusion 03/11/2022   Elevated troponin    Unstable angina ([place]) 01/05/2021   Hyperlipidemia LDL goal <70    CKD (chronic kidney disease), stage III (HCC)    Coronary artery disease    NSTEMI (non-ST elevated myocardial infarction) ([place]) 12/23/2020   Acquired hypothyroidism 04/20/2020   Chemotherapy-induced neuropathy ([place]) 04/20/2020   Nonthrombocytopenic purpura ([place]) 12/03/2019   Hypercoagulable state ([place]) 12/10/2018   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 pylori) infection 09/04/2017   Medicare annual wellness visit, initial 03/31/2017   History of prostate cancer 10/27/2014   Lower urinary tract symptoms (LUTS) 03/18/2012   Nephrolithiasis 03/18/2012    PCP: Rusty Aus, MD  REFERRING PROVIDER: Kris Hartmann, NP  REFERRING DIAG: Left below knee amputee  THERAPY DIAG:  Gait difficulty  Muscle  weakness (generalized)  Left below-knee amputee [facility])  Rationale for Evaluation and Treatment Rehabilitation  ONSET DATE: 03/16/22  SUBJECTIVE:   SUBJECTIVE STATEMENT:   EVALUATION Pt. S/p R BKA after blood clots in lower leg.  Pt. Reports minimum phantom limb symptoms and occasional muscle spasms.  Pt. Takes Baclofen to manage symptoms.  Pt. Received prosthetic leg on Wednesday from [facility].  Pt. Has h/o low back pain and has received spinal epidurals in past (2 years ago).    PERTINENT HISTORY: 1. Hx of BKA, left [facility]) Mr. Aidynn Krenn was seen for further evaluation for left below knee prosthesis.  The is a 76 year old male who had amputation on 03/16/2022.  At the time he is well healed and ready for fitting of new below knee prosthesis.  He is a highly motivated individual and should do well once fitted with prosthesis.  She has no problem returning to a K2 ambulator, which will allow him to walk inside and outside of his home and overload level barriers.    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  He will be having a bone marrow biopsy.  We will continue to have the patient keep his IVC filter for now as there may be further test.  We will have the patient return in 3 months to discuss filter removal.    PAIN:  Are you having pain? No  PRECAUTIONS: None  WEIGHT BEARING RESTRICTIONS No  FALLS:  Has patient fallen in last 6 months? No  LIVING ENVIRONMENT: Lives with: lives with their spouse Lives in: House/apartment Stairs: Yes: Internal: 13+ steps; on right going up Has following equipment at home: Single point cane and Walker - 2 wheeled  OCCUPATION: Retired  PLOF: [place] with walking and return to playing golf by spring.     OBJECTIVE:   PATIENT SURVEYS:  FOTO initial 40/ goal 20.    COGNITION:  Overall cognitive status: Within functional limits for tasks  assessed     SENSATION: WFL  EDEMA:  Circumferential: L/R knee joint line (34/36 cm.), calf (30.5/ 33 cm), distal quad (35/36 cm).    POSTURE: rounded shoulders and forward head  PALPATION: No tenderness/ good incision healing on residual limb  LOWER EXTREMITY ROM:  B LE WFL (all planes).  L knee 0-130 deg.   LOWER EXTREMITY MMT:  R LE muscle strength grossly 5/5 MMT except hip flexion 4+/5 MMT.  L LE strength grossly 5/5 MMT except hip flexion 4/5 MMT and hip abduction 4+/5 MMT.    FUNCTIONAL TESTS:  5 times sit to stand: 14.1 sec. And requires R UE assist.  Able to stand from chair with addition of 2" Airex pad.    GAIT: Distance walked: in clinic/ //-bars Assistive device utilized: Environmental consultant - 2 wheeled Level of assistance: Min A  Comments: Ambulates in clinic with RW and min. A/ cuing for proper technique and step pattern.  Pt. Able to ambulate in //-bars with R UE assist only and 2-point gait pattern.    11/27:  5xSTS: 13.04 sec. (No UE assist).   R LE muscle strength grossly 5/5 MMT except hip flexion 4+/5 MMT.  L LE strength grossly 5/5 MMT except hip flexion 4/5 MMT and hip abduction 4+/5 MMT.  No significant changes to hip flexor strength noted.    TODAY'S TREATMENT:  09/02/22:  Subjective: Pt. Reports he is doing okay.  Pt. Planning to hit some golf balls today and go to [facility].     There.ex.:  Walking in clinic/ hallway (3 laps) working on consistent L heel strike/ step length/ increase cadence.  No LOB.  Cuing to increase cadence/ upright posture.     Nustep L5 10 min. B LE only (consistent cadence). Discussed hitting golf balls.    Walking in //-bars: blue mat with wts under to simulate uneven terrain (light to no UE assist)- SBA/CGA for safety.    Ascend/ descend 4 stairs x 4 with use of R UE on handrail and recip. Pattern.  Focus on L knee/quad eccentric muscle control while descending step.      Resisted gait at [place] (60#): 5x all 4-planes of movement  (forward/backwards/lateral)- cuing for consistent step pattern and BOS at agility ladder.    SBA/CGA for safety and verbal cuing.  No UE assist    Walking outside on varying terrain/ parking lot without use of SPC.     PATIENT EDUCATION:  Education details: Reviewed HEP Person educated: Patient Education method: Explanation, Demonstration, and Handouts Education comprehension: verbalized understanding and returned demonstration   HOME EXERCISE PROGRAM: See handouts.   ASSESSMENT:  CLINICAL IMPRESSION: Patient is highly motivated and hard working during standing/ walking tasks today.  Pt. Walking more consistently around PT clinic without use of SPC and recip. Gait pattern.   PT discussed POC with pt. And emphasized walking with consistent L heel strike/ knee extension.  Pt. Will benefit from skilled PT services to increase LE muscle strength to improve independence with walking/ return to playing golf.       OBJECTIVE IMPAIRMENTS Abnormal gait, decreased activity tolerance, decreased balance, decreased endurance, decreased mobility, difficulty walking, decreased strength, impaired flexibility, improper body mechanics, postural dysfunction, and pain.   ACTIVITY LIMITATIONS carrying, lifting, bending, standing, squatting, stairs, transfers, toileting, dressing, and locomotion level  PARTICIPATION LIMITATIONS: driving, community activity, and yard work  PERSONAL FACTORS Fitness and Past/current experiences are also affecting patient's functional outcome.   REHAB POTENTIAL: Good  CLINICAL DECISION MAKING: Stable/uncomplicated  EVALUATION COMPLEXITY: Low   GOALS: Goals reviewed with patient? Yes  SHORT TERM GOALS: Target date: 09/27/22 Pt. Independent with HEP to increase B hip strength 1/2 muscle grade to improve standing/walking mod. Independence.   Baseline:  see above.  L hip flexion 4/5 MMT.  Goal status: Partially met    LONG TERM GOALS: Target date: 10/25/22  Pt. Will  increase FOTO to 59 to improve functional mobility.   Baseline:  initial FOTO 40.  11/8: 68 Goal status: Goal met  2.  Pt. Able to ascend/descend 13 stairs with R handrail and consistent step pattern to improve pts. Ability to go to 2nd floor of home.  11/8: recip. Gait on stairs outside with R handrail for safety.   Baseline:  Goal status: Goal met  3.  Pt. Will ambulate with mod. I and SPC  with consistent 2-point gait pattern and no loss of balance to improve independence at home/ community.   Baseline:  11/8: pt. Ambulates community distances Goal status: Goal met  4.  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    Baseline:  pt. Requires extra time/ SPC while walking outside and limited with carrying  5.  Pt. Able to return to hitting golf balls/ playing round of golf with no limitations safely.    Baseline:  pt. Demonstrates chipping golf balls in gym with SBA/CGA    PLAN: PT FREQUENCY: 2x/week  PT DURATION: 8 weeks  PLANNED INTERVENTIONS: Therapeutic exercises, Therapeutic activity, Neuromuscular re-education, Balance training, Gait training, Patient/Family education, Self Care, Joint mobilization, Stair training, Prosthetic training, Manual therapy, and Re-evaluation  PLAN FOR NEXT SESSION: Progress gait with least assistive device.  Simulate golf swing.    Pura Spice, PT, DPT # [phone] 09/02/2022, 12:17 PM

## 2022-09-06 ENCOUNTER — Ambulatory Visit: Payer: Medicare HMO | Admitting: Physical Therapy

## 2022-09-06 DIAGNOSIS — M6281 Muscle weakness (generalized): Secondary | ICD-10-CM

## 2022-09-06 DIAGNOSIS — Z89512 Acquired absence of left leg below knee: Secondary | ICD-10-CM

## 2022-09-06 DIAGNOSIS — R269 Unspecified abnormalities of gait and mobility: Secondary | ICD-10-CM | POA: Diagnosis not present

## 2022-09-06 NOTE — Therapy (Signed)
OUTPATIENT PHYSICAL THERAPY LOWER EXTREMITY TREATMENT  Patient Name: Phillip [name] MRN: [id number] DOB:07-06-1946, 76 y.o., male Today's Date: 09/06/2022   PT End of Session - 09/06/22 0845     Visit Number 16    Number of Visits 30    Date for PT Re-Evaluation 10/25/22    PT Start Time 0855    PT Stop Time 0943    PT Time Calculation (min) 48 min             Past Medical History:  Diagnosis Date   Arthritis    Basal cell carcinoma    L clavicle, L prox forearm- removed years ago    Bladder cancer [facility])    Bladder neck contracture    CAD (coronary artery disease)    a. 12/2020 NSTEMI/Cath: LM nl, LAD 95ost/p, LCX 50p, RCA nl. EF 45-50%.   Cataract    CKD (chronic kidney disease), stage III [facility])    ED (erectile dysfunction)    Frequency    GERD (gastroesophageal reflux disease)    History of pulmonary embolus (PE) 11/2018   a. Following LE DVT-->Chronic warfarin.   Hyperlipidemia LDL goal <70    Hypertension    Hypothyroidism    Incontinence of urine    sui, s/p cryoablation   Ischemic cardiomyopathy    a. 11/2018 Echo: EF 60-65%; b. 12/2020 LV Gram: EF 45-50% in setting of NSTEMI.   Kidney stones    Neuropathy    feet   Nocturia    Prostate cancer ([name])    S/P   CRYOABLATION   Right elbow tendinitis    Right Lower Extremity DVT (deep venous thrombosis) ([place]) 11/25/2018   Vertigo    1-2x/yr   Wears glasses    Past Surgical History:  Procedure Laterality Date   AMPUTATION Left 03/16/2022   Procedure: AMPUTATION BELOW KNEE;  Surgeon: Katha Cabal, MD;  Location: [facility] ORS;  Service: Vascular;  Laterality: Left;   ARTHROPLASTY AND TENDON REPAIR LEFT THUMB  JAN 2014   CATARACT EXTRACTION W/PHACO Left 11/16/2020   Procedure: CATARACT EXTRACTION PHACO AND INTRAOCULAR LENS PLACEMENT (IOC) LEFT  7.09 00:56.4 12.6%;  Surgeon: Leandrew Koyanagi, MD;  Location: [place];  Service: Ophthalmology;  Laterality: Left;   CATARACT EXTRACTION W/PHACO  Right 12/02/2020   Procedure: CATARACT EXTRACTION PHACO AND INTRAOCULAR LENS PLACEMENT (IOC) RIGHT MALYUGIN 5.52 00:49.7 11.1%;  Surgeon: Leandrew Koyanagi, MD;  Location: [place];  Service: Ophthalmology;  Laterality: Right;   COLONOSCOPY     COLONOSCOPY WITH PROPOFOL N/A 08/09/2017   Procedure: COLONOSCOPY WITH PROPOFOL;  Surgeon: Manya Silvas, MD;  Location[facility] ENDOSCOPY;  Service: Endoscopy;  Laterality: N/A;   CORONARY ANGIOGRAPHY N/A 12/28/2020   Procedure: CORONARY ANGIOGRAPHY;  Surgeon: Sherren Mocha, MD;  Location: [place] CV LAB;  Service: Cardiovascular;  Laterality: N/A;   CORONARY STENT INTERVENTION N/A 12/28/2020   Procedure: CORONARY STENT INTERVENTION;  Surgeon: Sherren Mocha, MD;  Location: [place] CV LAB;  Service: Cardiovascular;  Laterality: N/A;   ESOPHAGOGASTRODUODENOSCOPY (EGD) WITH PROPOFOL N/A 08/09/2017   Procedure: ESOPHAGOGASTRODUODENOSCOPY (EGD) WITH PROPOFOL;  Surgeon: Manya Silvas, MD;  Location: [facility] ENDOSCOPY;  Service: Endoscopy;  Laterality: N/A;   GREEN LIGHT LASER TURP (TRANSURETHRAL RESECTION OF PROSTATE N/A 12/14/2015   Procedure: GREEN LIGHT LASER TURP (TRANSURETHRAL RESECTION OF PROSTATE) LASER OF BLADDER NECK CONTRACTURE;  Surgeon: Carolan Clines, MD;  Location: [place];  Service: Urology;  Laterality: N/A;   IVC FILTER INSERTION N/A 03/19/2022  Procedure: IVC FILTER INSERTION;  Surgeon: Serafina Mitchell, MD;  Location: [place] CV LAB;  Service: Cardiovascular;  Laterality: N/A;   LEFT HEART CATH AND CORONARY ANGIOGRAPHY N/A 12/24/2020   Procedure: LEFT HEART CATH AND CORONARY ANGIOGRAPHY and possible PCI and stent;  Surgeon: Yolonda Kida, MD;  Location: [place] CV LAB;  Service: Cardiovascular;  Laterality: N/A;   LEFT HEART CATH AND CORONARY ANGIOGRAPHY N/A 01/06/2021   Procedure: LEFT HEART CATH AND CORONARY ANGIOGRAPHY;  Surgeon: Troy Sine, MD;  Location: [place] CV LAB;   Service: Cardiovascular;  Laterality: N/A;   LOWER EXTREMITY ANGIOGRAPHY Left 03/11/2022   Procedure: Lower Extremity Angiography;  Surgeon: Katha Cabal, MD;  Location: [place] CV LAB;  Service: Cardiovascular;  Laterality: Left;   PENILE PROSTHESIS IMPLANT N/A 12/06/2013   Procedure: PENILE PROTHESIS INFLATABLE;  Surgeon: Ailene Rud, MD;  Location: [facility];  Service: Urology;  Laterality: N/A;   PROSTATE CRYOABLATION  06-01-2012  [facility]   PULMONARY THROMBECTOMY N/A 03/18/2022   Procedure: PULMONARY THROMBECTOMY;  Surgeon: Algernon Huxley, MD;  Location: [place] CV LAB;  Service: Cardiovascular;  Laterality: N/A;   PULMONARY THROMBECTOMY Bilateral 03/23/2022   Procedure: PULMONARY THROMBECTOMY;  Surgeon: Katha Cabal, MD;  Location: [place] CV LAB;  Service: Cardiovascular;  Laterality: Bilateral;   THROMBECTOMY ILIAC ARTERY Left 03/12/2022   Procedure: THROMBECTOMY LEG;  Surgeon: Conrad [place] Neck, MD;  Location: [facility] ORS;  Service: Vascular;  Laterality: Left;   TRANSURETHRAL RESECTION OF BLADDER NECK N/A 03/20/2015   Procedure: RELEASE BLADDER NECK CONTRACTURE  WITH WOLF BUTTON ELECTRODE ;  Surgeon: Carolan Clines, MD;  Location: [place];  Service: Urology;  Laterality: N/A;   TRANSURETHRAL RESECTION OF BLADDER TUMOR N/A 12/05/2018   Procedure: TRANSURETHRAL RESECTION OF BLADDER TUMOR (TURBT)/CYSTOSCOPY/  INSTILLATION OF Rodell Perna;  Surgeon: Ceasar Mons, MD;  Location: [facility];  Service: Urology;  Laterality: N/A;   TRANSURETHRAL RESECTION OF BLADDER TUMOR N/A 01/15/2020   Procedure: TRANSURETHRAL RESECTION OF BLADDER TUMOR (TURBT)/ CYSTOSCOPY;  Surgeon: Ceasar Mons, MD;  Location[facility];  Service: Urology;  Laterality: N/A;   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     Patient Active Problem List   Diagnosis Date Noted   Iron  deficiency anemia 06/20/2022   Myeloproliferative disorder ([place]) 05/19/2022   History of bladder cancer 05/19/2022   Goals of care, counseling/discussion 03/23/2022   Reactive thrombocytosis 03/21/2022   Paroxysmal atrial fibrillation with RVR ([place]) 03/20/2022   Acute bilateral deep vein thrombosis (DVT) of femoral veins (HCC) 03/20/2022   Acute respiratory failure with hypoxia (HCC) 03/16/2022   Acute on chronic diastolic CHF (congestive heart failure) ([place]) 03/16/2022   PSVT (paroxysmal supraventricular tachycardia) 03/16/2022   AKI (acute kidney injury) ([place]) 03/15/2022   Critical limb ischemia of left lower extremity with ulceration of lower leg ([place]) 03/15/2022   Arterial occlusion 03/11/2022   Elevated troponin    Unstable angina ([place]) 01/05/2021   Hyperlipidemia LDL goal <70    CKD (chronic kidney disease), stage III (HCC)    Coronary artery disease    NSTEMI (non-ST elevated myocardial infarction) ([place]) 12/23/2020   Acquired hypothyroidism 04/20/2020   Chemotherapy-induced neuropathy ([place]) 04/20/2020   Nonthrombocytopenic purpura ([place]) 12/03/2019   Hypercoagulable state ([place]) 12/10/2018   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 pylori) infection 09/04/2017   Medicare annual wellness visit, initial 03/31/2017   History of prostate cancer 10/27/2014   Lower urinary tract symptoms (LUTS) 03/18/2012   Nephrolithiasis 03/18/2012    PCP: Rusty Aus, MD  REFERRING PROVIDER: Kris Hartmann, NP  REFERRING DIAG: Left below knee amputee  THERAPY DIAG:  Gait difficulty  Muscle  weakness (generalized)  Left below-knee amputee [facility])  Rationale for Evaluation and Treatment Rehabilitation  ONSET DATE: 03/16/22  SUBJECTIVE:   SUBJECTIVE STATEMENT:   EVALUATION Pt. S/p R BKA after blood clots in lower leg.  Pt. Reports minimum phantom limb symptoms and occasional muscle spasms.  Pt. Takes Baclofen to manage symptoms.  Pt. Received prosthetic leg on Wednesday from [facility].  Pt. Has h/o low back pain and has received spinal epidurals in past (2 years ago).    PERTINENT HISTORY: 1. Hx of BKA, left [facility]) Mr. Phillip [name] was seen for further evaluation for left below knee prosthesis.  The is a 76 year old male who had amputation on 03/16/2022.  At the time he is well healed and ready for fitting of new below knee prosthesis.  He is a highly motivated individual and should do well once fitted with prosthesis.  She has no problem returning to a K2 ambulator, which will allow him to walk inside and outside of his home and overload level barriers.    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  He will be having a bone marrow biopsy.  We will continue to have the patient keep his IVC filter for now as there may be further test.  We will have the patient return in 3 months to discuss filter removal.    PAIN:  Are you having pain? No  PRECAUTIONS: None  WEIGHT BEARING RESTRICTIONS No  FALLS:  Has patient fallen in last 6 months? No  LIVING ENVIRONMENT: Lives with: lives with their spouse Lives in: House/apartment Stairs: Yes: Internal: 13+ steps; on right going up Has following equipment at home: Single point cane and Walker - 2 wheeled  OCCUPATION: Retired  PLOF: [place] with walking and return to playing golf by spring.     OBJECTIVE:   PATIENT SURVEYS:  FOTO initial 40/ goal 89.    COGNITION:  Overall cognitive status: Within functional limits for tasks  assessed     SENSATION: WFL  EDEMA:  Circumferential: L/R knee joint line (34/36 cm.), calf (30.5/ 33 cm), distal quad (35/36 cm).    POSTURE: rounded shoulders and forward head  PALPATION: No tenderness/ good incision healing on residual limb  LOWER EXTREMITY ROM:  B LE WFL (all planes).  L knee 0-130 deg.   LOWER EXTREMITY MMT:  R LE muscle strength grossly 5/5 MMT except hip flexion 4+/5 MMT.  L LE strength grossly 5/5 MMT except hip flexion 4/5 MMT and hip abduction 4+/5 MMT.    FUNCTIONAL TESTS:  5 times sit to stand: 14.1 sec. And requires R UE assist.  Able to stand from chair with addition of 2" Airex pad.    GAIT: Distance walked: in clinic/ //-bars Assistive device utilized: Environmental consultant - 2 wheeled Level of assistance: Min A  Comments: Ambulates in clinic with RW and min. A/ cuing for proper technique and step pattern.  Pt. Able to ambulate in //-bars with R UE assist only and 2-point gait pattern.    11/27:  5xSTS: 13.04 sec. (No UE assist).   R LE muscle strength grossly 5/5 MMT except hip flexion 4+/5 MMT.  L LE strength grossly 5/5 MMT except hip flexion 4/5 MMT and hip abduction 4+/5 MMT.  No significant changes to hip flexor strength noted.    TODAY'S TREATMENT:  09/06/22:  Subjective: Pt. 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  Pt. States he added ply socks which has seemed to help.  Pt. Has 7 ply socks on L limb.  Pt. Has f/u with Staci Righter tomorrow to discuss.   There.ex.:  Walking in clinic/ hallway (3 laps) working on consistent L heel strike/ step length/ increase cadence.  Ascend/ descend 4 stairs x 4 with use of R UE on handrail and recip. Pattern.  Focus on L knee/quad eccentric muscle control while descending step.  Nustep L5 10 min. B LE only (consistent cadence). Discussed hitting golf balls.    Walking in //-bars: high marching and added Airex pad with wt. Shifting with no UE  assist and SBA/CGA for safety.       Standing on Airex with wt. Shifting/ golf swing outside of //-bars with SBA for safety/ cuing.    Floor to waist lifting with 10# box and B carrying in gym.     Walking outside on varying terrain/ parking lot without use of SPC.     PATIENT EDUCATION:  Education details: Reviewed HEP Person educated: Patient Education method: Explanation, Demonstration, and Handouts Education comprehension: verbalized understanding and returned demonstration   HOME EXERCISE PROGRAM: See handouts.   ASSESSMENT:  CLINICAL IMPRESSION: Patient is highly motivated and hard working during standing/ walking tasks today.  Pt. Walking more consistently around PT clinic without use of SPC and recip. gait pattern.  Pt. challenged with Airex/ wt. Shifting and golf swing.  Pt. Able to self-correct any LOB forward with small controlled step.   PT discussed POC with pt. And emphasized walking with consistent L heel strike/ knee extension.  Pt. Will benefit from skilled PT services to increase LE muscle strength to improve independence with walking/ return to playing golf.       OBJECTIVE IMPAIRMENTS Abnormal gait, decreased activity tolerance, decreased balance, decreased endurance, decreased mobility, difficulty walking, decreased strength, impaired flexibility, improper body mechanics, postural dysfunction, and pain.   ACTIVITY LIMITATIONS carrying, lifting, bending, standing, squatting, stairs, transfers, toileting, dressing, and locomotion level  PARTICIPATION LIMITATIONS: driving, community activity, and yard work  PERSONAL FACTORS Fitness and Past/current experiences are also affecting patient's functional outcome.   REHAB POTENTIAL: Good  CLINICAL DECISION MAKING: Stable/uncomplicated  EVALUATION COMPLEXITY: Low   GOALS: Goals reviewed with patient? Yes  SHORT TERM GOALS: Target date: 09/27/22 Pt. Independent with HEP to increase B hip strength 1/2 muscle grade  to improve standing/walking mod. Independence.   Baseline:  see above.  L hip flexion 4/5 MMT.  Goal status: Partially met    LONG TERM GOALS: Target date: 10/25/22  Pt. Will increase FOTO to 59 to improve functional mobility.   Baseline:  initial FOTO 40.  11/8: 68 Goal status: Goal met  2.  Pt. Able to ascend/descend 13 stairs with R handrail and consistent step pattern to improve pts. Ability to go to 2nd  floor of home.  11/8: recip. Gait on stairs outside with R handrail for safety.   Baseline:  Goal status: Goal met  3.  Pt. Will ambulate with mod. I and SPC with consistent 2-point gait pattern and no loss of balance to improve independence at home/ community.   Baseline:  11/8: pt. Ambulates community distances Goal status: Goal met  4.  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    Baseline:  pt. Requires extra time/ SPC while walking outside and limited with carrying  5.  Pt. Able to return to hitting golf balls/ playing round of golf with no limitations safely.    Baseline:  pt. Demonstrates chipping golf balls in gym with SBA/CGA    PLAN: PT FREQUENCY: 2x/week  PT DURATION: 8 weeks  PLANNED INTERVENTIONS: Therapeutic exercises, Therapeutic activity, Neuromuscular re-education, Balance training, Gait training, Patient/Family education, Self Care, Joint mobilization, Stair training, Prosthetic training, Manual therapy, and Re-evaluation  PLAN FOR NEXT SESSION: Progress gait with least assistive device.  Simulate golf swing.    Pura Spice, PT, DPT # [phone] 09/06/2022, 9:43 AM

## 2022-09-08 ENCOUNTER — Ambulatory Visit: Payer: Medicare HMO | Admitting: Physical Therapy

## 2022-09-08 DIAGNOSIS — Z89512 Acquired absence of left leg below knee: Secondary | ICD-10-CM

## 2022-09-08 DIAGNOSIS — M6281 Muscle weakness (generalized): Secondary | ICD-10-CM

## 2022-09-08 DIAGNOSIS — R269 Unspecified abnormalities of gait and mobility: Secondary | ICD-10-CM | POA: Diagnosis not present

## 2022-09-08 NOTE — Therapy (Signed)
OUTPATIENT PHYSICAL THERAPY LOWER EXTREMITY TREATMENT  Patient Name: Phillip [name] MRN: [id number] DOB:01-22-1946, 76 y.o., male Today's Date: 09/08/2022   PT End of Session - 09/08/22 0906     Visit Number 17    Number of Visits 30    Date for PT Re-Evaluation 10/25/22    PT Start Time 1884            1660 to 0948  (57 minutes)  Past Medical History:  Diagnosis Date   Arthritis    Basal cell carcinoma    L clavicle, L prox forearm- removed years ago    Bladder cancer [facility])    Bladder neck contracture    CAD (coronary artery disease)    a. 12/2020 NSTEMI/Cath: LM nl, LAD 95ost/p, LCX 50p, RCA nl. EF 45-50%.   Cataract    CKD (chronic kidney disease), stage III [facility])    ED (erectile dysfunction)    Frequency    GERD (gastroesophageal reflux disease)    History of pulmonary embolus (PE) 11/2018   a. Following LE DVT-->Chronic warfarin.   Hyperlipidemia LDL goal <70    Hypertension    Hypothyroidism    Incontinence of urine    sui, s/p cryoablation   Ischemic cardiomyopathy    a. 11/2018 Echo: EF 60-65%; b. 12/2020 LV Gram: EF 45-50% in setting of NSTEMI.   Kidney stones    Neuropathy    feet   Nocturia    Prostate cancer ([place])    S/P   CRYOABLATION   Right elbow tendinitis    Right Lower Extremity DVT (deep venous thrombosis) ([place]) 11/25/2018   Vertigo    1-2x/yr   Wears glasses    Past Surgical History:  Procedure Laterality Date   AMPUTATION Left 03/16/2022   Procedure: AMPUTATION BELOW KNEE;  Surgeon: Katha Cabal, MD;  Location: [facility] ORS;  Service: Vascular;  Laterality: Left;   ARTHROPLASTY AND TENDON REPAIR LEFT THUMB  JAN 2014   CATARACT EXTRACTION W/PHACO Left 11/16/2020   Procedure: CATARACT EXTRACTION PHACO AND INTRAOCULAR LENS PLACEMENT (IOC) LEFT  7.09 00:56.4 12.6%;  Surgeon: Leandrew Koyanagi, MD;  Location: [place];  Service: Ophthalmology;  Laterality: Left;   CATARACT EXTRACTION W/PHACO Right 12/02/2020   Procedure:  CATARACT EXTRACTION PHACO AND INTRAOCULAR LENS PLACEMENT (IOC) RIGHT MALYUGIN 5.52 00:49.7 11.1%;  Surgeon: Leandrew Koyanagi, MD;  Location: [place];  Service: Ophthalmology;  Laterality: Right;   COLONOSCOPY     COLONOSCOPY WITH PROPOFOL N/A 08/09/2017   Procedure: COLONOSCOPY WITH PROPOFOL;  Surgeon: Manya Silvas, MD;  Location: [facility] ENDOSCOPY;  Service: Endoscopy;  Laterality: N/A;   CORONARY ANGIOGRAPHY N/A 12/28/2020   Procedure: CORONARY ANGIOGRAPHY;  Surgeon: Sherren Mocha, MD;  Location: [place] CV LAB;  Service: Cardiovascular;  Laterality: N/A;   CORONARY STENT INTERVENTION N/A 12/28/2020   Procedure: CORONARY STENT INTERVENTION;  Surgeon: Sherren Mocha, MD;  Location: [place] CV LAB;  Service: Cardiovascular;  Laterality: N/A;   ESOPHAGOGASTRODUODENOSCOPY (EGD) WITH PROPOFOL N/A 08/09/2017   Procedure: ESOPHAGOGASTRODUODENOSCOPY (EGD) WITH PROPOFOL;  Surgeon: Manya Silvas, MD;  Location: [facility] ENDOSCOPY;  Service: Endoscopy;  Laterality: N/A;   GREEN LIGHT LASER TURP (TRANSURETHRAL RESECTION OF PROSTATE N/A 12/14/2015   Procedure: GREEN LIGHT LASER TURP (TRANSURETHRAL RESECTION OF PROSTATE) LASER OF BLADDER NECK CONTRACTURE;  Surgeon: Carolan Clines, MD;  Location: [place];  Service: Urology;  Laterality: N/A;   IVC FILTER INSERTION N/A 03/19/2022   Procedure: IVC FILTER INSERTION;  Surgeon: Harold Barban  W, MD;  Location: [place] [facility] LAB;  Service: Cardiovascular;  Laterality: N/A;   LEFT HEART CATH AND CORONARY ANGIOGRAPHY N/A 12/24/2020   Procedure: LEFT HEART CATH AND CORONARY ANGIOGRAPHY and possible PCI and stent;  Surgeon: Yolonda Kida, MD;  Location: [place] [facility] LAB;  Service: Cardiovascular;  Laterality: N/A;   LEFT HEART CATH AND CORONARY ANGIOGRAPHY N/A 01/06/2021   Procedure: LEFT HEART CATH AND CORONARY ANGIOGRAPHY;  Surgeon: Troy Sine, MD;  Location: [place] [facility] LAB;  Service: Cardiovascular;   Laterality: N/A;   LOWER EXTREMITY ANGIOGRAPHY Left 03/11/2022   Procedure: Lower Extremity Angiography;  Surgeon: Katha Cabal, MD;  Location: [place] [facility] LAB;  Service: Cardiovascular;  Laterality: Left;   PENILE PROSTHESIS IMPLANT N/A 12/06/2013   Procedure: PENILE PROTHESIS INFLATABLE;  Surgeon: Ailene Rud, MD;  Location: [facility];  Service: Urology;  Laterality: N/A;   PROSTATE CRYOABLATION  06-01-2012  [facility]   PULMONARY THROMBECTOMY N/A 03/18/2022   Procedure: PULMONARY THROMBECTOMY;  Surgeon: Algernon Huxley, MD;  Location: [place] [facility] LAB;  Service: Cardiovascular;  Laterality: N/A;   PULMONARY THROMBECTOMY Bilateral 03/23/2022   Procedure: PULMONARY THROMBECTOMY;  Surgeon: Katha Cabal, MD;  Location: [place] CV LAB;  Service: Cardiovascular;  Laterality: Bilateral;   THROMBECTOMY ILIAC ARTERY Left 03/12/2022   Procedure: THROMBECTOMY LEG;  Surgeon: Conrad Bennet, MD;  Location: [facility];  Service: Vascular;  Laterality: Left;   TRANSURETHRAL RESECTION OF BLADDER NECK N/A 03/20/2015   Procedure: RELEASE BLADDER NECK CONTRACTURE  WITH WOLF BUTTON ELECTRODE ;  Surgeon: Carolan Clines, MD;  Location: [place];  Service: Urology;  Laterality: N/A;   TRANSURETHRAL RESECTION OF BLADDER TUMOR N/A 12/05/2018   Procedure: TRANSURETHRAL RESECTION OF BLADDER TUMOR (TURBT)/CYSTOSCOPY/  INSTILLATION OF Rodell Perna;  Surgeon: Ceasar Mons, MD;  Location: [facility];  Service: Urology;  Laterality: N/A;   TRANSURETHRAL RESECTION OF BLADDER TUMOR N/A 01/15/2020   Procedure: TRANSURETHRAL RESECTION OF BLADDER TUMOR (TURBT)/ CYSTOSCOPY;  Surgeon: Ceasar Mons, MD;  Location: [facility];  Service: Urology;  Laterality: N/A;   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     Patient Active Problem List   Diagnosis Date Noted   Iron deficiency anemia 06/20/2022    Myeloproliferative disorder ([place]) 05/19/2022   History of bladder cancer 05/19/2022   Goals of care, counseling/discussion 03/23/2022   Reactive thrombocytosis 03/21/2022   Paroxysmal atrial fibrillation with RVR ([place]) 03/20/2022   Acute bilateral deep vein thrombosis (DVT) of femoral veins (HCC) 03/20/2022   Acute respiratory failure with hypoxia (HCC) 03/16/2022   Acute on chronic diastolic CHF (congestive heart failure) ([place]) 03/16/2022   PSVT (paroxysmal supraventricular tachycardia) 03/16/2022   AKI (acute kidney injury) ([place]) 03/15/2022   Critical limb ischemia of left lower extremity with ulceration of lower leg ([place]) 03/15/2022   Arterial occlusion 03/11/2022   Elevated troponin    Unstable angina ([place]) 01/05/2021   Hyperlipidemia LDL goal <70    CKD (chronic kidney disease), stage III (HCC)    Coronary artery disease    NSTEMI (non-ST elevated myocardial infarction) ([place]) 12/23/2020   Acquired hypothyroidism 04/20/2020   Chemotherapy-induced neuropathy ([place]) 04/20/2020   Nonthrombocytopenic purpura ([place]) 12/03/2019   Hypercoagulable state ([place]) 12/10/2018   Recurrent deep vein thrombosis (DVT) ([place])    Bilateral pulmonary embolism ([place]) 12/07/2018   GERD (gastroesophageal reflux disease) 12/07/2018   HTN (hypertension) 12/07/2018   HLD (hyperlipidemia)  12/07/2018   Prostate cancer ([place]) 12/07/2018   Bladder cancer ([place]) 12/07/2018   DM type 2 with diabetic mixed hyperlipidemia ([place]) 04/04/2018   Pain in right hand 10/04/2017   Tubular adenoma 09/38/1829   Helicobacter pylori (H. pylori) infection 09/04/2017   Medicare annual wellness visit, initial 03/31/2017   History of prostate cancer 10/27/2014   Lower urinary tract symptoms (LUTS) 03/18/2012   Nephrolithiasis 03/18/2012    PCP: Rusty Aus, MD  REFERRING PROVIDER: Kris Hartmann, NP  REFERRING DIAG: Left below knee amputee  THERAPY DIAG:  Gait difficulty  Muscle weakness (generalized)  Left  below-knee amputee [facility])  Rationale for Evaluation and Treatment Rehabilitation  ONSET DATE: 03/16/22  SUBJECTIVE:   SUBJECTIVE STATEMENT:   EVALUATION Pt. S/p R BKA after blood clots in lower leg.  Pt. Reports minimum phantom limb symptoms and occasional muscle spasms.  Pt. Takes Baclofen to manage symptoms.  Pt. [facility]  Pt. Has h/o low back pain and has received spinal epidurals in past (2 years ago).    PERTINENT HISTORY: 1. Hx of BKA, left [facility]) Mr. Christyan Reger was seen for further evaluation for left below knee prosthesis.  The is a 76 year old male who had amputation on 03/16/2022.  At the time he is well healed and ready for fitting of new below knee prosthesis.  He is a highly motivated individual and should do well once fitted with prosthesis.  She has no problem returning to a K2 ambulator, which will allow him to walk inside and outside of his home and overload level barriers.    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  He will be having a bone marrow biopsy.  We will continue to have the patient keep his IVC filter for now as there may be further test.  We will have the patient return in 3 months to discuss filter removal.    PAIN:  Are you having pain? No  PRECAUTIONS: None  WEIGHT BEARING RESTRICTIONS No  FALLS:  Has patient fallen in last 6 months? No  LIVING ENVIRONMENT: Lives with: lives with their spouse Lives in: House/apartment Stairs: Yes: Internal: 13+ steps; on right going up Has following equipment at home: Single point cane and Walker - 2 wheeled  OCCUPATION: Retired  PLOF: [place] with walking and return to playing golf by spring.     OBJECTIVE:   PATIENT SURVEYS:  FOTO initial 40/ goal 58.    COGNITION:  Overall cognitive status: Within functional limits for tasks assessed     SENSATION: WFL  EDEMA:   Circumferential: L/R knee joint line (34/36 cm.), calf (30.5/ 33 cm), distal quad (35/36 cm).    POSTURE: rounded shoulders and forward head  PALPATION: No tenderness/ good incision healing on residual limb  LOWER EXTREMITY ROM:  B LE WFL (all planes).  L knee 0-130 deg.   LOWER EXTREMITY MMT:  R LE muscle strength grossly 5/5 MMT except hip flexion 4+/5 MMT.  L LE strength grossly 5/5 MMT except hip flexion 4/5 MMT and hip abduction 4+/5 MMT.    FUNCTIONAL TESTS:  5 times sit to stand: 14.1 sec. And requires R UE assist.  Able to stand from chair with addition of 2" Airex pad.    GAIT: Distance walked: in clinic/ //-bars Assistive device utilized: Environmental consultant - 2 wheeled Level of assistance: Min A Comments: Ambulates in clinic with RW and  min. A/ cuing for proper technique and step pattern.  Pt. Able to ambulate in //-bars with R UE assist only and 2-point gait pattern.    11/27:  5xSTS: 13.04 sec. (No UE assist).   R LE muscle strength grossly 5/5 MMT except hip flexion 4+/5 MMT.  L LE strength grossly 5/5 MMT except hip flexion 4/5 MMT and hip abduction 4+/5 MMT.  No significant changes to hip flexor strength noted.    TODAY'S TREATMENT:  09/08/22:  Subjective: Pt. Had f/u with Staci Righter and he placed a pad in prosthetic leg to deweight the tibia.  Pt. Wearing R lower leg compression stockings and 8 ply socks on L leg.  Pt. Went to gym yesterday to complete LE/UE ex.    There.ex.:  Nustep L5 10 min. B LE only (consistent cadence). Discussed gym ex.  Pt. Reports back/core weakness.   Walking in //-bars: high marching and cone taps with no UE assist and SBA/CGA for safety. (4 laps).    Recip. Stairs with 1 UE assist on handrail 4 steps x 4x.    Walking in clinic/ hallway (3 laps) working on consistent L heel strike/ step length/ increase cadence.  Ascend/ descend 4 stairs x 4 with use of R UE on handrail and recip. Pattern.  Focus on L knee/quad eccentric muscle control while  descending step.       Walking alt. UE/LE touches in hallway with increase cadence today.    Resisted gait 2BTB 5x all 4-planes of movement.  Difficulty with forward lunges.  Standing lunges at 2nd step with light to no R UE assist.     Walking outside on varying terrain/ parking lot without use of SPC.     PATIENT EDUCATION:  Education details: Reviewed HEP Person educated: Patient Education method: Explanation, Demonstration, and Handouts Education comprehension: verbalized understanding and returned demonstration   HOME EXERCISE PROGRAM: See handouts.   ASSESSMENT:  CLINICAL IMPRESSION: Patient is highly motivated and hard working during standing/ walking tasks today.  Pt. Walking more consistently around PT clinic without use of SPC and recip. gait pattern.  Minimal UE assist in //-bars with resisted gait lunges.  God quad muscle control and pt able to self-correct any LOB forward with small controlled step.   PT discussed POC with pt. And emphasized walking with consistent L heel strike/ knee extension.  Pt. Will benefit from skilled PT services to increase LE muscle strength to improve independence with walking/ return to playing golf.       OBJECTIVE IMPAIRMENTS Abnormal gait, decreased activity tolerance, decreased balance, decreased endurance, decreased mobility, difficulty walking, decreased strength, impaired flexibility, improper body mechanics, postural dysfunction, and pain.   ACTIVITY LIMITATIONS carrying, lifting, bending, standing, squatting, stairs, transfers, toileting, dressing, and locomotion level  PARTICIPATION LIMITATIONS: driving, community activity, and yard work  PERSONAL FACTORS Fitness and Past/current experiences are also affecting patient's functional outcome.   REHAB POTENTIAL: Good  CLINICAL DECISION MAKING: Stable/uncomplicated  EVALUATION COMPLEXITY: Low   GOALS: Goals reviewed with patient? Yes  SHORT TERM GOALS: Target date:  09/27/22 Pt. Independent with HEP to increase B hip strength 1/2 muscle grade to improve standing/walking mod. Independence.   Baseline:  see above.  L hip flexion 4/5 MMT.  Goal status: Partially met    LONG TERM GOALS: Target date: 10/25/22  Pt. Will increase FOTO to 59 to improve functional mobility.   Baseline:  initial FOTO 40.  11/8: 68 Goal status: Goal met  2.  Pt. Able  to ascend/descend 13 stairs with R handrail and consistent step pattern to improve pts. Ability to go to 2nd floor of home.  11/8: recip. Gait on stairs outside with R handrail for safety.   Baseline:  Goal status: Goal met  3.  Pt. Will ambulate with mod. I and SPC with consistent 2-point gait pattern and no loss of balance to improve independence at home/ community.   Baseline:  11/8: pt. Ambulates community distances Goal status: Goal met  4.  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    Baseline:  pt. Requires extra time/ SPC while walking outside and limited with carrying  5.  Pt. Able to return to hitting golf balls/ playing round of golf with no limitations safely.    Baseline:  pt. Demonstrates chipping golf balls in gym with SBA/CGA    PLAN: PT FREQUENCY: 2x/week  PT DURATION: 8 weeks  PLANNED INTERVENTIONS: Therapeutic exercises, Therapeutic activity, Neuromuscular re-education, Balance training, Gait training, Patient/Family education, Self Care, Joint mobilization, Stair training, Prosthetic training, Manual therapy, and Re-evaluation  PLAN FOR NEXT SESSION: Progress gait with least assistive device.  Simulate golf swing.    Pura Spice, PT, DPT # [phone] 09/08/2022, 9:07 AM

## 2022-09-09 DIAGNOSIS — R350 Frequency of micturition: Secondary | ICD-10-CM | POA: Diagnosis not present

## 2022-09-09 DIAGNOSIS — N5237 Erectile dysfunction following prostate ablative therapy: Secondary | ICD-10-CM | POA: Diagnosis not present

## 2022-09-09 DIAGNOSIS — N281 Cyst of kidney, acquired: Secondary | ICD-10-CM | POA: Diagnosis not present

## 2022-09-09 DIAGNOSIS — Z87442 Personal history of urinary calculi: Secondary | ICD-10-CM | POA: Diagnosis not present

## 2022-09-09 DIAGNOSIS — Z8546 Personal history of malignant neoplasm of prostate: Secondary | ICD-10-CM | POA: Diagnosis not present

## 2022-09-09 DIAGNOSIS — N393 Stress incontinence (female) (male): Secondary | ICD-10-CM | POA: Diagnosis not present

## 2022-09-09 DIAGNOSIS — C679 Malignant neoplasm of bladder, unspecified: Secondary | ICD-10-CM | POA: Diagnosis not present

## 2022-09-09 DIAGNOSIS — R3915 Urgency of urination: Secondary | ICD-10-CM | POA: Diagnosis not present

## 2022-09-09 DIAGNOSIS — N1831 Chronic kidney disease, stage 3a: Secondary | ICD-10-CM | POA: Diagnosis not present

## 2022-09-13 ENCOUNTER — Ambulatory Visit: Payer: Medicare HMO | Admitting: Physical Therapy

## 2022-09-14 ENCOUNTER — Encounter: Payer: Medicare HMO | Admitting: Physical Therapy

## 2022-09-15 ENCOUNTER — Encounter: Payer: Medicare HMO | Admitting: Physical Therapy

## 2022-09-20 ENCOUNTER — Telehealth: Payer: Self-pay | Admitting: *Deleted

## 2022-09-20 ENCOUNTER — Encounter: Payer: Self-pay | Admitting: *Deleted

## 2022-09-20 NOTE — Patient Outreach (Signed)
  Care Coordination   Initial Visit Note   09/20/2022 Name: Phillip [name] MRN: [id number] DOB: Feb 27, 1946  Phillip [name] is a 77 y.o. year old male who sees Rusty Aus, MD for primary care. I spoke with  Shelia Media by phone today.  What matters to the patients health and wellness today?  State he is doing well.  Had BKA in June of last year, doing well with prosthetic now.  Denies that he needs follow up call, will call RNCM with questions.     Goals Addressed             This Visit's Progress    COMPLETED: Care Coordination Activities - No follow up needed       Care Coordination Interventions: Evaluation of current treatment plan related to ongoing PT for prosthetic leg post BKA and patient's adherence to plan as established by provider Advised patient to continue therapy sessions Reviewed medications with patient and discussed affordability. State Eliquis can get expensive once he hits the donut hole.  He will keep RNCM updated on cost Reviewed scheduled/upcoming provider appointments including cancer center on 2/1, PCP on 3/7, cardiology on 4/18 Screening for signs and symptoms of depression related to chronic disease state  Assessed social determinant of health barriers         SDOH assessments and interventions completed:  Yes  SDOH Interventions Today    Flowsheet Row Most Recent Value  SDOH Interventions   Food Insecurity Interventions Intervention Not Indicated  Housing Interventions Intervention Not Indicated  Transportation Interventions Intervention Not Indicated        Care Coordination Interventions:  Yes, provided   Follow up plan: No further intervention required.   Encounter Outcome:  Pt. Visit Completed   Phillip David, RN, MSN, [place] Care Management Care Management Coordinator [phone]

## 2022-09-20 NOTE — Patient Instructions (Signed)
Visit Information  Thank you for taking time to visit with me today. Please don't hesitate to contact me if I can be of assistance to you.  Following are the goals we discussed today:  Continue outpatient therapy sessions. Notify this care manager if cost of Eliquis increases.   Please call the Suicide and Crisis Lifeline: 988 call the [place] National Suicide Prevention Lifeline: (220)283-6667 or TTY: (985)258-3779 TTY (308) 497-1219) to talk to a trained counselor call 1-800-273-TALK (toll free, 24 hour hotline) call 911 if you are experiencing a Mental Health or [place] or need someone to talk to.  Patient verbalizes understanding of instructions and care plan provided today and agrees to view in [place]. Active MyChart status and patient understanding of how to access instructions and care plan via MyChart confirmed with patient.     The patient has been provided with contact information for the care management team and has been advised to call with any health related questions or concerns.   Valente David, RN, MSN, [place] Care Management Care Management Coordinator [phone]

## 2022-09-21 ENCOUNTER — Encounter: Payer: Self-pay | Admitting: Oncology

## 2022-09-23 ENCOUNTER — Ambulatory Visit: Payer: HMO | Attending: Nurse Practitioner | Admitting: Physical Therapy

## 2022-09-23 DIAGNOSIS — Z89512 Acquired absence of left leg below knee: Secondary | ICD-10-CM | POA: Insufficient documentation

## 2022-09-23 DIAGNOSIS — R269 Unspecified abnormalities of gait and mobility: Secondary | ICD-10-CM | POA: Insufficient documentation

## 2022-09-23 DIAGNOSIS — M6281 Muscle weakness (generalized): Secondary | ICD-10-CM | POA: Diagnosis not present

## 2022-09-23 NOTE — Therapy (Signed)
OUTPATIENT PHYSICAL THERAPY LOWER EXTREMITY TREATMENT  Patient Name: Phillip [name] MRN: [id number] DOB:1945-10-16, 77 y.o., male Today's Date: 09/23/2022   PT End of Session - 09/23/22 0912     Visit Number 18    Number of Visits 30    Date for PT Re-Evaluation 10/25/22    PT Start Time 0901            0901 to 0947  (46 minutes).     Past Medical History:  Diagnosis Date   Arthritis    Basal cell carcinoma    L clavicle, L prox forearm- removed years ago    Bladder cancer (HCC)    Bladder neck contracture    CAD (coronary artery disease)    a. 12/2020 NSTEMI/Cath: LM nl, LAD 95ost/p, LCX 50p, RCA nl. EF 45-50%.   Cataract    CKD (chronic kidney disease), stage III [facility])    ED (erectile dysfunction)    Frequency    GERD (gastroesophageal reflux disease)    History of pulmonary embolus (PE) 11/2018   a. Following LE DVT-->Chronic warfarin.   Hyperlipidemia LDL goal <70    Hypertension    Hypothyroidism    Incontinence of urine    sui, s/p cryoablation   Ischemic cardiomyopathy    a. 11/2018 Echo: EF 60-65%; b. 12/2020 LV Gram: EF 45-50% in setting of NSTEMI.   Kidney stones    Neuropathy    feet   Nocturia    Prostate cancer ([place])    S/P   CRYOABLATION   Right elbow tendinitis    Right Lower Extremity DVT (deep venous thrombosis) ([place]) 11/25/2018   Vertigo    1-2x/yr   Wears glasses    Past Surgical History:  Procedure Laterality Date   AMPUTATION Left 03/16/2022   Procedure: AMPUTATION BELOW KNEE;  Surgeon: Katha Cabal, MD;  Location: [facility] ORS;  Service: Vascular;  Laterality: Left;   ARTHROPLASTY AND TENDON REPAIR LEFT THUMB  JAN 2014   CATARACT EXTRACTION W/PHACO Left 11/16/2020   Procedure: CATARACT EXTRACTION PHACO AND INTRAOCULAR LENS PLACEMENT (IOC) LEFT  7.09 00:56.4 12.6%;  Surgeon: Leandrew Koyanagi, MD;  Location: [place];  Service: Ophthalmology;  Laterality: Left;   CATARACT EXTRACTION W/PHACO Right 12/02/2020   Procedure:  CATARACT EXTRACTION PHACO AND INTRAOCULAR LENS PLACEMENT (IOC) RIGHT MALYUGIN 5.52 00:49.7 11.1%;  Surgeon: Leandrew Koyanagi, MD;  Location: [place];  Service: Ophthalmology;  Laterality: Right;   COLONOSCOPY     COLONOSCOPY WITH PROPOFOL N/A 08/09/2017   Procedure: COLONOSCOPY WITH PROPOFOL;  Surgeon: Manya Silvas, MD;  Location: [facility] ENDOSCOPY;  Service: Endoscopy;  Laterality: N/A;   CORONARY ANGIOGRAPHY N/A 12/28/2020   Procedure: CORONARY ANGIOGRAPHY;  Surgeon: Sherren Mocha, MD;  Location: [place] CV LAB;  Service: Cardiovascular;  Laterality: N/A;   CORONARY STENT INTERVENTION N/A 12/28/2020   Procedure: CORONARY STENT INTERVENTION;  Surgeon: Sherren Mocha, MD;  Location: [place] CV LAB;  Service: Cardiovascular;  Laterality: N/A;   ESOPHAGOGASTRODUODENOSCOPY (EGD) WITH PROPOFOL N/A 08/09/2017   Procedure: ESOPHAGOGASTRODUODENOSCOPY (EGD) WITH PROPOFOL;  Surgeon: Manya Silvas, MD;  Location: [facility] ENDOSCOPY;  Service: Endoscopy;  Laterality: N/A;   GREEN LIGHT LASER TURP (TRANSURETHRAL RESECTION OF PROSTATE N/A 12/14/2015   Procedure: GREEN LIGHT LASER TURP (TRANSURETHRAL RESECTION OF PROSTATE) LASER OF BLADDER NECK CONTRACTURE;  Surgeon: Carolan Clines, MD;  Location: [place];  Service: Urology;  Laterality: N/A;   IVC FILTER INSERTION N/A 03/19/2022   Procedure: IVC FILTER INSERTION;  Surgeon: Serafina Mitchell, MD;  Location: [place] [facility] LAB;  Service: Cardiovascular;  Laterality: N/A;   LEFT HEART CATH AND CORONARY ANGIOGRAPHY N/A 12/24/2020   Procedure: LEFT HEART CATH AND CORONARY ANGIOGRAPHY and possible PCI and stent;  Surgeon: Yolonda Kida, MD;  Location: [place] CV LAB;  Service: Cardiovascular;  Laterality: N/A;   LEFT HEART CATH AND CORONARY ANGIOGRAPHY N/A 01/06/2021   Procedure: LEFT HEART CATH AND CORONARY ANGIOGRAPHY;  Surgeon: Troy Sine, MD;  Location: [place] CV LAB;  Service: Cardiovascular;   Laterality: N/A;   LOWER EXTREMITY ANGIOGRAPHY Left 03/11/2022   Procedure: Lower Extremity Angiography;  Surgeon: Katha Cabal, MD;  Location: [place] CV LAB;  Service: Cardiovascular;  Laterality: Left;   PENILE PROSTHESIS IMPLANT N/A 12/06/2013   Procedure: PENILE PROTHESIS INFLATABLE;  Surgeon: Ailene Rud, MD;  Location: [facility];  Service: Urology;  Laterality: N/A;   PROSTATE CRYOABLATION  06-01-2012  [facility]   PULMONARY THROMBECTOMY N/A 03/18/2022   Procedure: PULMONARY THROMBECTOMY;  Surgeon: Algernon Huxley, MD;  Location: [place] CV LAB;  Service: Cardiovascular;  Laterality: N/A;   PULMONARY THROMBECTOMY Bilateral 03/23/2022   Procedure: PULMONARY THROMBECTOMY;  Surgeon: Katha Cabal, MD;  Location: [place] CV LAB;  Service: Cardiovascular;  Laterality: Bilateral;   THROMBECTOMY ILIAC ARTERY Left 03/12/2022   Procedure: THROMBECTOMY LEG;  Surgeon: Conrad , MD;  Location: [facility] ORS;  Service: Vascular;  Laterality: Left;   TRANSURETHRAL RESECTION OF BLADDER NECK N/A 03/20/2015   Procedure: RELEASE BLADDER NECK CONTRACTURE  WITH WOLF BUTTON ELECTRODE ;  Surgeon: Carolan Clines, MD;  Location: [place];  Service: Urology;  Laterality: N/A;   TRANSURETHRAL RESECTION OF BLADDER TUMOR N/A 12/05/2018   Procedure: TRANSURETHRAL RESECTION OF BLADDER TUMOR (TURBT)/CYSTOSCOPY/  INSTILLATION OF Rodell Perna;  Surgeon: Ceasar Mons, MD;  Location: [facility];  Service: Urology;  Laterality: N/A;   TRANSURETHRAL RESECTION OF BLADDER TUMOR N/A 01/15/2020   Procedure: TRANSURETHRAL RESECTION OF BLADDER TUMOR (TURBT)/ CYSTOSCOPY;  Surgeon: Ceasar Mons, MD;  Location: [facility];  Service: Urology;  Laterality: N/A;   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     Patient Active Problem List   Diagnosis Date Noted   Iron deficiency anemia 06/20/2022    Myeloproliferative disorder ([place]) 05/19/2022   History of bladder cancer 05/19/2022   Goals of care, counseling/discussion 03/23/2022   Reactive thrombocytosis 03/21/2022   Paroxysmal atrial fibrillation with RVR ([place]) 03/20/2022   Acute bilateral deep vein thrombosis (DVT) of femoral veins (HCC) 03/20/2022   Acute respiratory failure with hypoxia (HCC) 03/16/2022   Acute on chronic diastolic CHF (congestive heart failure) ([place]) 03/16/2022   PSVT (paroxysmal supraventricular tachycardia) 03/16/2022   AKI (acute kidney injury) ([place]) 03/15/2022   Critical limb ischemia of left lower extremity with ulceration of lower leg ([place]) 03/15/2022   Arterial occlusion 03/11/2022   Elevated troponin    Unstable angina ([place]) 01/05/2021   Hyperlipidemia LDL goal <70    CKD (chronic kidney disease), stage III (HCC)    Coronary artery disease    NSTEMI (non-ST elevated myocardial infarction) ([place]) 12/23/2020   Acquired hypothyroidism 04/20/2020   Chemotherapy-induced neuropathy ([place]) 04/20/2020   Nonthrombocytopenic purpura ([place]) 12/03/2019   Hypercoagulable state ([place]) 12/10/2018   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 pylori) infection 09/04/2017   Medicare annual wellness visit, initial 03/31/2017   History of prostate cancer 10/27/2014   Lower urinary tract symptoms (LUTS) 03/18/2012   Nephrolithiasis 03/18/2012    PCP: Rusty Aus, MD  REFERRING PROVIDER: Kris Hartmann, NP  REFERRING DIAG: Left below knee amputee  THERAPY DIAG:  Gait difficulty  Muscle weakness (generalized)  Left  below-knee amputee [facility])  Rationale for Evaluation and Treatment Rehabilitation  ONSET DATE: 03/16/22  SUBJECTIVE:   SUBJECTIVE STATEMENT:   EVALUATION Pt. S/p R BKA after blood clots in lower leg.  Pt. Reports minimum phantom limb symptoms and occasional muscle spasms.  Pt. Takes Baclofen to manage symptoms.  Pt. Received prosthetic leg on Wednesday from [facility].  Pt. Has h/o low back pain and has received spinal epidurals in past (2 years ago).    PERTINENT HISTORY: 1. Hx of BKA, left [facility]) Mr. Jimmie Rueter was seen for further evaluation for left below knee prosthesis.  The is a 77 year old male who had amputation on 03/16/2022.  At the time he is well healed and ready for fitting of new below knee prosthesis.  He is a highly motivated individual and should do well once fitted with prosthesis.  She has no problem returning to a K2 ambulator, which will allow him to walk inside and outside of his home and overload level barriers.    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  He will be having a bone marrow biopsy.  We will continue to have the patient keep his IVC filter for now as there may be further test.  We will have the patient return in 3 months to discuss filter removal.    PAIN:  Are you having pain? No  PRECAUTIONS: None  WEIGHT BEARING RESTRICTIONS No  FALLS:  Has patient fallen in last 6 months? No  LIVING ENVIRONMENT: Lives with: lives with their spouse Lives in: House/apartment Stairs: Yes: Internal: 13+ steps; on right going up Has following equipment at home: Single point cane and Walker - 2 wheeled  OCCUPATION: Retired  PLOF: [place] with walking and return to playing golf by spring.     OBJECTIVE:   PATIENT SURVEYS:  FOTO initial 40/ goal 43.    COGNITION:  Overall cognitive status: Within functional limits for tasks assessed     SENSATION: WFL  EDEMA:   Circumferential: L/R knee joint line (34/36 cm.), calf (30.5/ 33 cm), distal quad (35/36 cm).    POSTURE: rounded shoulders and forward head  PALPATION: No tenderness/ good incision healing on residual limb  LOWER EXTREMITY ROM:  B LE WFL (all planes).  L knee 0-130 deg.   LOWER EXTREMITY MMT:  R LE muscle strength grossly 5/5 MMT except hip flexion 4+/5 MMT.  L LE strength grossly 5/5 MMT except hip flexion 4/5 MMT and hip abduction 4+/5 MMT.    FUNCTIONAL TESTS:  5 times sit to stand: 14.1 sec. And requires R UE assist.  Able to stand from chair with addition of 2" Airex pad.    GAIT: Distance walked: in clinic/ //-bars Assistive device utilized: Environmental consultant - 2 wheeled Level of assistance: Min A Comments: Ambulates in clinic with  RW and min. A/ cuing for proper technique and step pattern.  Pt. Able to ambulate in //-bars with R UE assist only and 2-point gait pattern.    11/27:  5xSTS: 13.04 sec. (No UE assist).   R LE muscle strength grossly 5/5 MMT except hip flexion 4+/5 MMT.  L LE strength grossly 5/5 MMT except hip flexion 4/5 MMT and hip abduction 4+/5 MMT.  No significant changes to hip flexor strength noted.    TODAY'S TREATMENT:  09/23/22:  Subjective: Pt. Reports no new complaints.  Pt. Entered PT with consistent recip. Pattern while wearing prosthetic leg.     There.ex.:  Nustep L6 10 min. B LE only (consistent cadence). Discussed gym ex.  Discussed daily activities.      Walking in //-bars: high marching and cone taps with no UE assist and SBA/CGA for safety. (4 laps).      Walking in clinic/ hallway (3 laps) working on consistent L heel strike/ step length/ increase cadence.    Ascend/ descend 4 stairs x 4 with use of R UE on handrail and recip. Pattern.  Focus on L knee/quad eccentric muscle control while descending step.       Resisted gait 2BTB 5x all 4-planes of movement.  Difficulty with forward lunges.    Walking at agility ladder with 6" and 12"  hurdles.  Challenged with recip. Pattern and PT provided SBA/CGA for safety.    Golf chipping with mat in clinic with good wt. shifting    PATIENT EDUCATION:  Education details: Reviewed HEP Person educated: Patient Education method: Explanation, Demonstration, and Handouts Education comprehension: verbalized understanding and returned demonstration   HOME EXERCISE PROGRAM: See handouts.   ASSESSMENT:  CLINICAL IMPRESSION: Patient is highly motivated and hard working during standing/ walking tasks today.  Pt. Walking more consistently around PT clinic without use of SPC and recip. gait pattern.  Minimal UE assist in //-bars with resisted gait for safety.  Pt. Challenged with recip. Steps over hurdles at agility with no UE assist.   PT discussed POC with pt. And emphasized walking with consistent L heel strike/ knee extension.  Pt. Will benefit from skilled PT services to increase LE muscle strength to improve independence with walking/ return to playing golf.       OBJECTIVE IMPAIRMENTS Abnormal gait, decreased activity tolerance, decreased balance, decreased endurance, decreased mobility, difficulty walking, decreased strength, impaired flexibility, improper body mechanics, postural dysfunction, and pain.   ACTIVITY LIMITATIONS carrying, lifting, bending, standing, squatting, stairs, transfers, toileting, dressing, and locomotion level  PARTICIPATION LIMITATIONS: driving, community activity, and yard work  PERSONAL FACTORS Fitness and Past/current experiences are also affecting patient's functional outcome.   REHAB POTENTIAL: Good  CLINICAL DECISION MAKING: Stable/uncomplicated  EVALUATION COMPLEXITY: Low   GOALS: Goals reviewed with patient? Yes  SHORT TERM GOALS: Target date: 09/27/22 Pt. Independent with HEP to increase B hip strength 1/2 muscle grade to improve standing/walking mod. Independence.   Baseline:  see above.  L hip flexion 4/5 MMT.  Goal status: Partially  met    LONG TERM GOALS: Target date: 10/25/22  Pt. Will increase FOTO to 59 to improve functional mobility.   Baseline:  initial FOTO 40.  11/8: 68 Goal status: Goal met  2.  Pt. Able to ascend/descend 13 stairs with R handrail and consistent step pattern to improve pts. Ability to go to 2nd floor of home.  11/8: recip. Gait on stairs outside with R handrail for safety.   Baseline:  Goal status:  Goal met  3.  Pt. Will ambulate with mod. I and SPC with consistent 2-point gait pattern and no loss of balance to improve independence at home/ community.   Baseline:  11/8: pt. Ambulates community distances Goal status: Goal met  4.  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    Baseline:  pt. Requires extra time/ SPC while walking outside and limited with carrying  5.  Pt. Able to return to hitting golf balls/ playing round of golf with no limitations safely.    Baseline:  pt. Demonstrates chipping golf balls in gym with SBA/CGA    PLAN: PT FREQUENCY: 2x/week  PT DURATION: 8 weeks  PLANNED INTERVENTIONS: Therapeutic exercises, Therapeutic activity, Neuromuscular re-education, Balance training, Gait training, Patient/Family education, Self Care, Joint mobilization, Stair training, Prosthetic training, Manual therapy, and Re-evaluation  PLAN FOR NEXT SESSION: Progress gait with least assistive device.  Simulate golf swing outside   Pura Spice, PT, DPT # [phone] 09/23/2022, 9:13 AM

## 2022-09-27 ENCOUNTER — Ambulatory Visit: Payer: HMO | Admitting: Physical Therapy

## 2022-09-27 DIAGNOSIS — M6281 Muscle weakness (generalized): Secondary | ICD-10-CM

## 2022-09-27 DIAGNOSIS — R269 Unspecified abnormalities of gait and mobility: Secondary | ICD-10-CM

## 2022-09-27 DIAGNOSIS — Z89512 Acquired absence of left leg below knee: Secondary | ICD-10-CM

## 2022-09-27 NOTE — Therapy (Signed)
OUTPATIENT PHYSICAL THERAPY LOWER EXTREMITY TREATMENT  Patient Name: Phillip [name] MRN: [id number] DOB:July 05, 1946, 77 y.o., male Today's Date: 09/27/2022   PT End of Session - 09/27/22 0738     Visit Number 19    Number of Visits 30    Date for PT Re-Evaluation 10/25/22    PT Start Time 0729    PT Stop Time 0819    PT Time Calculation (min) 50 min             Past Medical History:  Diagnosis Date   Arthritis    Basal cell carcinoma    L clavicle, L prox forearm- removed years ago    Bladder cancer [facility])    Bladder neck contracture    CAD (coronary artery disease)    a. 12/2020 NSTEMI/Cath: LM nl, LAD 95ost/p, LCX 50p, RCA nl. EF 45-50%.   Cataract    CKD (chronic kidney disease), stage III [facility])    ED (erectile dysfunction)    Frequency    GERD (gastroesophageal reflux disease)    History of pulmonary embolus (PE) 11/2018   a. Following LE DVT-->Chronic warfarin.   Hyperlipidemia LDL goal <70    Hypertension    Hypothyroidism    Incontinence of urine    sui, s/p cryoablation   Ischemic cardiomyopathy    a. 11/2018 Echo: EF 60-65%; b. 12/2020 LV Gram: EF 45-50% in setting of NSTEMI.   Kidney stones    Neuropathy    feet   Nocturia    Prostate cancer ([place])    S/P   CRYOABLATION   Right elbow tendinitis    Right Lower Extremity DVT (deep venous thrombosis) ([place]) 11/25/2018   Vertigo    1-2x/yr   Wears glasses    Past Surgical History:  Procedure Laterality Date   AMPUTATION Left 03/16/2022   Procedure: AMPUTATION BELOW KNEE;  Surgeon: Katha Cabal, MD;  Location: [facility]S;  Service: Vascular;  Laterality: Left;   ARTHROPLASTY AND TENDON REPAIR LEFT THUMB  JAN 2014   CATARACT EXTRACTION W/PHACO Left 11/16/2020   Procedure: CATARACT EXTRACTION PHACO AND INTRAOCULAR LENS PLACEMENT (IOC) LEFT  7.09 00:56.4 12.6%;  Surgeon: Leandrew Koyanagi, MD;  Location: [place];  Service: Ophthalmology;  Laterality: Left;   CATARACT EXTRACTION W/PHACO Right  12/02/2020   Procedure: CATARACT EXTRACTION PHACO AND INTRAOCULAR LENS PLACEMENT (IOC) RIGHT MALYUGIN 5.52 00:49.7 11.1%;  Surgeon: Leandrew Koyanagi, MD;  Location: [place];  Service: Ophthalmology;  Laterality: Right;   COLONOSCOPY     COLONOSCOPY WITH PROPOFOL N/A 08/09/2017   Procedure: COLONOSCOPY WITH PROPOFOL;  Surgeon: Manya Silvas, MD;  Location: [facility] ENDOSCOPY;  Service: Endoscopy;  Laterality: N/A;   CORONARY ANGIOGRAPHY N/A 12/28/2020   Procedure: CORONARY ANGIOGRAPHY;  Surgeon: Sherren Mocha, MD;  Location: [place] CV LAB;  Service: Cardiovascular;  Laterality: N/A;   CORONARY STENT INTERVENTION N/A 12/28/2020   Procedure: CORONARY STENT INTERVENTION;  Surgeon: Sherren Mocha, MD;  Location: [place] CV LAB;  Service: Cardiovascular;  Laterality: N/A;   ESOPHAGOGASTRODUODENOSCOPY (EGD) WITH PROPOFOL N/A 08/09/2017   Procedure: ESOPHAGOGASTRODUODENOSCOPY (EGD) WITH PROPOFOL;  Surgeon: Manya Silvas, MD;  Location: [facility] ENDOSCOPY;  Service: Endoscopy;  Laterality: N/A;   GREEN LIGHT LASER TURP (TRANSURETHRAL RESECTION OF PROSTATE N/A 12/14/2015   Procedure: GREEN LIGHT LASER TURP (TRANSURETHRAL RESECTION OF PROSTATE) LASER OF BLADDER NECK CONTRACTURE;  Surgeon: Carolan Clines, MD;  Location: [place];  Service: Urology;  Laterality: N/A;   IVC FILTER INSERTION N/A 03/19/2022  Procedure: IVC FILTER INSERTION;  Surgeon: Serafina Mitchell, MD;  Location: [place] [facility] LAB;  Service: Cardiovascular;  Laterality: N/A;   LEFT HEART CATH AND CORONARY ANGIOGRAPHY N/A 12/24/2020   Procedure: LEFT HEART CATH AND CORONARY ANGIOGRAPHY and possible PCI and stent;  Surgeon: Yolonda Kida, MD;  Location: [place] [facility] LAB;  Service: Cardiovascular;  Laterality: N/A;   LEFT HEART CATH AND CORONARY ANGIOGRAPHY N/A 01/06/2021   Procedure: LEFT HEART CATH AND CORONARY ANGIOGRAPHY;  Surgeon: Troy Sine, MD;  Location: [place] [facility];   Service: Cardiovascular;  Laterality: N/A;   LOWER EXTREMITY ANGIOGRAPHY Left 03/11/2022   Procedure: Lower Extremity Angiography;  Surgeon: Katha Cabal, MD;  Location: [place] [facility];  Service: Cardiovascular;  Laterality: Left;   PENILE PROSTHESIS IMPLANT N/A 12/06/2013   Procedure: PENILE PROTHESIS INFLATABLE;  Surgeon: Ailene Rud, MD;  Location: [facility];  Service: Urology;  Laterality: N/A;   PROSTATE CRYOABLATION  06-01-2012  [place]   PULMONARY THROMBECTOMY N/A 03/18/2022   Procedure: PULMONARY THROMBECTOMY;  Surgeon: Algernon Huxley, MD;  Location: [place] [facility] LAB;  Service: Cardiovascular;  Laterality: N/A;   PULMONARY THROMBECTOMY Bilateral 03/23/2022   Procedure: PULMONARY THROMBECTOMY;  Surgeon: Katha Cabal, MD;  Location: [place] [facility];  Service: Cardiovascular;  Laterality: Bilateral;   THROMBECTOMY ILIAC ARTERY Left 03/12/2022   Procedure: THROMBECTOMY LEG;  Surgeon: Conrad Scotland Neck, MD;  Location: [facility]S;  Service: Vascular;  Laterality: Left;   TRANSURETHRAL RESECTION OF BLADDER NECK N/A 03/20/2015   Procedure: RELEASE BLADDER NECK CONTRACTURE  WITH WOLF BUTTON ELECTRODE ;  Surgeon: Carolan Clines, MD;  Location: [place];  Service: Urology;  Laterality: N/A;   TRANSURETHRAL RESECTION OF BLADDER TUMOR N/A 12/05/2018   Procedure: TRANSURETHRAL RESECTION OF BLADDER TUMOR (TURBT)/CYSTOSCOPY/  INSTILLATION OF Rodell Perna;  Surgeon: Ceasar Mons, MD;  Location: [facility];  Service: Urology;  Laterality: N/A;   TRANSURETHRAL RESECTION OF BLADDER TUMOR N/A 01/15/2020   Procedure: TRANSURETHRAL RESECTION OF BLADDER TUMOR (TURBT)/ CYSTOSCOPY;  Surgeon: Ceasar Mons, MD;  Location: [facility];  Service: Urology;  Laterality: N/A;   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     Patient Active Problem List   Diagnosis Date Noted   Iron  deficiency anemia 06/20/2022   Myeloproliferative disorder ([place]) 05/19/2022   History of bladder cancer 05/19/2022   Goals of care, counseling/discussion 03/23/2022   Reactive thrombocytosis 03/21/2022   Paroxysmal atrial fibrillation with RVR ([place]) 03/20/2022   Acute bilateral deep vein thrombosis (DVT) of femoral veins (HCC) 03/20/2022   Acute respiratory failure with hypoxia (HCC) 03/16/2022   Acute on chronic diastolic CHF (congestive heart failure) ([place]) 03/16/2022   PSVT (paroxysmal supraventricular tachycardia) 03/16/2022   AKI (acute kidney injury) ([place]) 03/15/2022   Critical limb ischemia of left lower extremity with ulceration of lower leg ([place]) 03/15/2022   Arterial occlusion 03/11/2022   Elevated troponin    Unstable angina ([place]) 01/05/2021   Hyperlipidemia LDL goal <70    CKD (chronic kidney disease), stage III (HCC)    Coronary artery disease    NSTEMI (non-ST elevated myocardial infarction) ([place]) 12/23/2020   Acquired hypothyroidism 04/20/2020   Chemotherapy-induced neuropathy ([place]) 04/20/2020   Nonthrombocytopenic purpura ([place]) 12/03/2019   Hypercoagulable state ([place]) 12/10/2018   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 pylori) infection 09/04/2017   Medicare annual wellness visit, initial 03/31/2017   History of prostate cancer 10/27/2014   Lower urinary tract symptoms (LUTS) 03/18/2012   Nephrolithiasis 03/18/2012    PCP: Rusty Aus, MD  REFERRING PROVIDER: Kris Hartmann, NP  REFERRING DIAG: Left below knee amputee  THERAPY DIAG:  Gait difficulty  Muscle  weakness (generalized)  Left below-knee amputee [facility])  Rationale for Evaluation and Treatment Rehabilitation  ONSET DATE: 03/16/22  SUBJECTIVE:   SUBJECTIVE STATEMENT:   EVALUATION Pt. S/p R BKA after blood clots in lower leg.  Pt. Reports minimum phantom limb symptoms and occasional muscle spasms.  Pt. Takes Baclofen to manage symptoms.  Pt. Received prosthetic leg on Wednesday from [facility].  Pt. Has h/o low back pain and has received spinal epidurals in past (2 years ago).    PERTINENT HISTORY: 1. Hx of BKA, left [facility]) Mr. Aidynn Krenn was seen for further evaluation for left below knee prosthesis.  The is a 77 year old male who had amputation on 03/16/2022.  At the time he is well healed and ready for fitting of new below knee prosthesis.  He is a highly motivated individual and should do well once fitted with prosthesis.  She has no problem returning to a K2 ambulator, which will allow him to walk inside and outside of his home and overload level barriers.    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  He will be having a bone marrow biopsy.  We will continue to have the patient keep his IVC filter for now as there may be further test.  We will have the patient return in 3 months to discuss filter removal.    PAIN:  Are you having pain? No  PRECAUTIONS: None  WEIGHT BEARING RESTRICTIONS No  FALLS:  Has patient fallen in last 6 months? No  LIVING ENVIRONMENT: Lives with: lives with their spouse Lives in: House/apartment Stairs: Yes: Internal: 13+ steps; on right going up Has following equipment at home: Single point cane and Walker - 2 wheeled  OCCUPATION: Retired  PLOF: [place] with walking and return to playing golf by spring.     OBJECTIVE:   PATIENT SURVEYS:  FOTO initial 40/ goal 20.    COGNITION:  Overall cognitive status: Within functional limits for tasks  assessed     SENSATION: WFL  EDEMA:  Circumferential: L/R knee joint line (34/36 cm.), calf (30.5/ 33 cm), distal quad (35/36 cm).    POSTURE: rounded shoulders and forward head  PALPATION: No tenderness/ good incision healing on residual limb  LOWER EXTREMITY ROM:  B LE WFL (all planes).  L knee 0-130 deg.   LOWER EXTREMITY MMT:  R LE muscle strength grossly 5/5 MMT except hip flexion 4+/5 MMT.  L LE strength grossly 5/5 MMT except hip flexion 4/5 MMT and hip abduction 4+/5 MMT.    FUNCTIONAL TESTS:  5 times sit to stand: 14.1 sec. And requires R UE assist.  Able to stand from chair with addition of 2" Airex pad.    GAIT: Distance walked: in clinic/ //-bars Assistive device utilized: Environmental consultant - 2 wheeled Level of assistance: Min A  Comments: Ambulates in clinic with RW and min. A/ cuing for proper technique and step pattern.  Pt. Able to ambulate in //-bars with R UE assist only and 2-point gait pattern.    11/27:  5xSTS: 13.04 sec. (No UE assist).   R LE muscle strength grossly 5/5 MMT except hip flexion 4+/5 MMT.  L LE strength grossly 5/5 MMT except hip flexion 4/5 MMT and hip abduction 4+/5 MMT.  No significant changes to hip flexor strength noted.    TODAY'S TREATMENT:  09/27/22:  Subjective:  Pt. Reports no new complaints.  Pt. Entered PT with consistent recip. Pattern while wearing prosthetic leg (no SPC).   No reports of pain.    There.ex.:  Nustep L6 10 min. B LE only (consistent cadence). Discussed gym ex.  Discussed daily activities.      Walking in //-bars: high marching and cone taps with light to no UE assist and SBA/CGA for safety. (4 laps).      Walking in clinic/ hallway (3 laps) working on consistent L heel strike/ step length/ increase cadence.    6" step with added Airex lunges and step ups (light UE assist in //-bars).    Ascend/ descend 4 stairs x 4 with use of R UE on handrail and recip. Pattern.  Focus on L knee/quad eccentric muscle control  while descending step.       Resisted gait 2BTB 5x all 4-planes of movement.  SBA for safety/ cuing.  No LOB but occasional shuffling.      PATIENT EDUCATION:  Education details: Reviewed HEP Person educated: Patient Education method: Explanation, Demonstration, and Handouts Education comprehension: verbalized understanding and returned demonstration   HOME EXERCISE PROGRAM: See handouts.   ASSESSMENT:  CLINICAL IMPRESSION: Patient is highly motivated and hard working during standing/ walking tasks today.  Pt. Walking more consistently around PT clinic without use of SPC and recip. gait pattern.  Minimal UE assist in //-bars with resisted gait/ Airex lunges for safety.  PT discussed POC with pt. And completing more gym based ex./ walking program.  Pt. Will benefit from skilled PT services to increase LE muscle strength to improve independence with walking/ return to playing golf.       OBJECTIVE IMPAIRMENTS Abnormal gait, decreased activity tolerance, decreased balance, decreased endurance, decreased mobility, difficulty walking, decreased strength, impaired flexibility, improper body mechanics, postural dysfunction, and pain.   ACTIVITY LIMITATIONS carrying, lifting, bending, standing, squatting, stairs, transfers, toileting, dressing, and locomotion level  PARTICIPATION LIMITATIONS: driving, community activity, and yard work  PERSONAL FACTORS Fitness and Past/current experiences are also affecting patient's functional outcome.   REHAB POTENTIAL: Good  CLINICAL DECISION MAKING: Stable/uncomplicated  EVALUATION COMPLEXITY: Low   GOALS: Goals reviewed with patient? Yes  SHORT TERM GOALS: Target date: 09/27/22 Pt. Independent with HEP to increase B hip strength 1/2 muscle grade to improve standing/walking mod. Independence.   Baseline:  see above.  L hip flexion 4/5 MMT.  Goal status: Partially met    LONG TERM GOALS: Target date: 10/25/22  Pt. Will increase FOTO to 59 to  improve functional mobility.   Baseline:  initial FOTO 40.  11/8: 68 Goal status: Goal met  2.  Pt. Able to ascend/descend 13 stairs with R handrail and consistent step pattern to improve pts. Ability to go to 2nd floor of home.  11/8: recip. Gait on stairs outside with R handrail for safety.   Baseline:  Goal status: Goal met  3.  Pt. Will ambulate with mod. I  and SPC with consistent 2-point gait pattern and no loss of balance to improve independence at home/ community.   Baseline:  11/8: pt. Ambulates community distances Goal status: Goal met  4.  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    Baseline:  pt. Requires extra time/ SPC while walking outside and limited with carrying  5.  Pt. Able to return to hitting golf balls/ playing round of golf with no limitations safely.    Baseline:  pt. Demonstrates chipping golf balls in gym with SBA/CGA    PLAN: PT FREQUENCY: 2x/week  PT DURATION: 8 weeks  PLANNED INTERVENTIONS: Therapeutic exercises, Therapeutic activity, Neuromuscular re-education, Balance training, Gait training, Patient/Family education, Self Care, Joint mobilization, Stair training, Prosthetic training, Manual therapy, and Re-evaluation  PLAN FOR NEXT SESSION: Progress gait with least assistive device.  Simulate golf swing outside.  CHECK MMT/ update STG  Pura Spice, PT, DPT # [phone] 09/27/2022, 10:41 AM

## 2022-09-29 ENCOUNTER — Ambulatory Visit: Payer: HMO | Admitting: Physical Therapy

## 2022-09-29 DIAGNOSIS — M6281 Muscle weakness (generalized): Secondary | ICD-10-CM

## 2022-09-29 DIAGNOSIS — R269 Unspecified abnormalities of gait and mobility: Secondary | ICD-10-CM | POA: Diagnosis not present

## 2022-09-29 DIAGNOSIS — Z89512 Acquired absence of left leg below knee: Secondary | ICD-10-CM

## 2022-09-29 NOTE — Therapy (Signed)
OUTPATIENT PHYSICAL THERAPY LOWER EXTREMITY TREATMENT Physical Therapy Progress Note  Dates of reporting period  08/15/22  to  09/29/22  Patient Name: Phillip [name] MRN: [id number] DOB:20-Oct-1945, 77 y.o., male Today's Date: 09/29/2022   PT End of Session - 09/29/22 0816     Visit Number 20    Number of Visits 30    Date for PT Re-Evaluation 10/25/22    PT Start Time 0816    PT Stop Time 0912    PT Time Calculation (min) 56 min             Past Medical History:  Diagnosis Date   Arthritis    Basal cell carcinoma    L clavicle, L prox forearm- removed years ago    Bladder cancer [facility])    Bladder neck contracture    CAD (coronary artery disease)    a. 12/2020 NSTEMI/Cath: LM nl, LAD 95ost/p, LCX 50p, RCA nl. EF 45-50%.   Cataract    CKD (chronic kidney disease), stage III [facility])    ED (erectile dysfunction)    Frequency    GERD (gastroesophageal reflux disease)    History of pulmonary embolus (PE) 11/2018   a. Following LE DVT-->Chronic warfarin.   Hyperlipidemia LDL goal <70    Hypertension    Hypothyroidism    Incontinence of urine    sui, s/p cryoablation   Ischemic cardiomyopathy    a. 11/2018 Echo: EF 60-65%; b. 12/2020 LV Gram: EF 45-50% in setting of NSTEMI.   Kidney stones    Neuropathy    feet   Nocturia    Prostate cancer ([place])    S/P   CRYOABLATION   Right elbow tendinitis    Right Lower Extremity DVT (deep venous thrombosis) ([place]) 11/25/2018   Vertigo    1-2x/yr   Wears glasses    Past Surgical History:  Procedure Laterality Date   AMPUTATION Left 03/16/2022   Procedure: AMPUTATION BELOW KNEE;  Surgeon: Katha Cabal, MD;  Location: [facility] ORS;  Service: Vascular;  Laterality: Left;   ARTHROPLASTY AND TENDON REPAIR LEFT THUMB  JAN 2014   CATARACT EXTRACTION W/PHACO Left 11/16/2020   Procedure: CATARACT EXTRACTION PHACO AND INTRAOCULAR LENS PLACEMENT (IOC) LEFT  7.09 00:56.4 12.6%;  Surgeon: Leandrew Koyanagi, MD;  Location: [place];  Service: Ophthalmology;  Laterality: Left;   CATARACT EXTRACTION W/PHACO Right 12/02/2020   Procedure: CATARACT EXTRACTION PHACO AND INTRAOCULAR LENS PLACEMENT (IOC) RIGHT MALYUGIN 5.52 00:49.7 11.1%;  Surgeon: Leandrew Koyanagi, MD;  Location: [place];  Service: Ophthalmology;  Laterality: Right;   COLONOSCOPY     COLONOSCOPY WITH PROPOFOL N/A 08/09/2017   Procedure: COLONOSCOPY WITH PROPOFOL;  Surgeon: Manya Silvas, MD;  Location: [facility] ENDOSCOPY;  Service: Endoscopy;  Laterality: N/A;   CORONARY ANGIOGRAPHY N/A 12/28/2020   Procedure: CORONARY ANGIOGRAPHY;  Surgeon: Sherren Mocha, MD;  Location: [place] CV LAB;  Service: Cardiovascular;  Laterality: N/A;   CORONARY STENT INTERVENTION N/A 12/28/2020   Procedure: CORONARY STENT INTERVENTION;  Surgeon: Sherren Mocha, MD;  Location: [place] [facility];  Service: Cardiovascular;  Laterality: N/A;   ESOPHAGOGASTRODUODENOSCOPY (EGD) WITH PROPOFOL N/A 08/09/2017   Procedure: ESOPHAGOGASTRODUODENOSCOPY (EGD) WITH PROPOFOL;  Surgeon: Manya Silvas, MD;  [facility];  Service: Endoscopy;  Laterality: N/A;   GREEN LIGHT LASER TURP (TRANSURETHRAL RESECTION OF PROSTATE N/A 12/14/2015   Procedure: GREEN LIGHT LASER TURP (TRANSURETHRAL RESECTION OF PROSTATE) LASER OF BLADDER NECK CONTRACTURE;  Surgeon: Carolan Clines, MD;  Location: [place]  [facility];  Service: Urology;  Laterality: N/A;   IVC FILTER INSERTION N/A 03/19/2022   Procedure: IVC FILTER INSERTION;  Surgeon: Serafina Mitchell, MD;  Location: [place] [facility];  Service: Cardiovascular;  Laterality: N/A;   LEFT HEART CATH AND CORONARY ANGIOGRAPHY N/A 12/24/2020   Procedure: LEFT HEART CATH AND CORONARY ANGIOGRAPHY and possible PCI and stent;  Surgeon: Yolonda Kida, MD;  Location: [place] [facility];  Service: Cardiovascular;  Laterality: N/A;   LEFT HEART CATH AND CORONARY ANGIOGRAPHY N/A 01/06/2021   Procedure: LEFT HEART CATH AND  CORONARY ANGIOGRAPHY;  Surgeon: Troy Sine, MD;  Location: [place] [facility];  Service: Cardiovascular;  Laterality: N/A;   LOWER EXTREMITY ANGIOGRAPHY Left 03/11/2022   Procedure: Lower Extremity Angiography;  Surgeon: Katha Cabal, MD;  Location: [place] [facility];  Service: Cardiovascular;  Laterality: Left;   PENILE PROSTHESIS IMPLANT N/A 12/06/2013   Procedure: PENILE PROTHESIS INFLATABLE;  Surgeon: Ailene Rud, MD;  Location: [facility];  Service: Urology;  Laterality: N/A;   PROSTATE CRYOABLATION  06-01-2012  [facility]   PULMONARY THROMBECTOMY N/A 03/18/2022   Procedure: PULMONARY THROMBECTOMY;  Surgeon: Algernon Huxley, MD;  Location: [place] [facility];  Service: Cardiovascular;  Laterality: N/A;   PULMONARY THROMBECTOMY Bilateral 03/23/2022   Procedure: PULMONARY THROMBECTOMY;  Surgeon: Katha Cabal, MD;  Location: [place] [facility];  Service: Cardiovascular;  Laterality: Bilateral;   THROMBECTOMY ILIAC ARTERY Left 03/12/2022   Procedure: THROMBECTOMY LEG;  Surgeon: Conrad Doctor Phillips, MD;  Location: [facility] ORS;  Service: Vascular;  Laterality: Left;   TRANSURETHRAL RESECTION OF BLADDER NECK N/A 03/20/2015   Procedure: RELEASE BLADDER NECK CONTRACTURE  WITH WOLF BUTTON ELECTRODE ;  Surgeon: Carolan Clines, MD;  Location: [place];  Service: Urology;  Laterality: N/A;   TRANSURETHRAL RESECTION OF BLADDER TUMOR N/A 12/05/2018   Procedure: TRANSURETHRAL RESECTION OF BLADDER TUMOR (TURBT)/CYSTOSCOPY/  INSTILLATION OF Rodell Perna;  Surgeon: Ceasar Mons, MD;  Location[facility];  Service: Urology;  Laterality: N/A;   TRANSURETHRAL RESECTION OF BLADDER TUMOR N/A 01/15/2020   Procedure: TRANSURETHRAL RESECTION OF BLADDER TUMOR (TURBT)/ CYSTOSCOPY;  Surgeon: Ceasar Mons, MD;  Location: [facility];  Service: Urology;  Laterality: N/A;   TRIGGER FINGER RELEASE Left 10/2015   ulner nerve  neuropathy     Patient Active Problem List   Diagnosis Date Noted   Iron deficiency anemia 06/20/2022   Myeloproliferative disorder ([place]) 05/19/2022   History of bladder cancer 05/19/2022   Goals of care, counseling/discussion 03/23/2022   Reactive thrombocytosis 03/21/2022   Paroxysmal atrial fibrillation with RVR ([place]) 03/20/2022   Acute bilateral deep vein thrombosis (DVT) of femoral veins (HCC) 03/20/2022   Acute respiratory failure with hypoxia (HCC) 03/16/2022   Acute on chronic diastolic CHF (congestive heart failure) ([place]) 03/16/2022   PSVT (paroxysmal supraventricular tachycardia) 03/16/2022   AKI (acute kidney injury) ([place]) 03/15/2022   Critical limb ischemia of left lower extremity with ulceration of lower leg ([place]) 03/15/2022   Arterial occlusion 03/11/2022   Elevated troponin    Unstable angina ([place]) 01/05/2021   Hyperlipidemia LDL goal <70    CKD (chronic kidney disease), stage III (HCC)    Coronary artery disease    NSTEMI (non-ST elevated myocardial infarction) ([place]) 12/23/2020   Acquired hypothyroidism 04/20/2020   Chemotherapy-induced neuropathy ([place]) 04/20/2020   Nonthrombocytopenic purpura ([place]) 12/03/2019   Hypercoagulable state ([place]) 12/10/2018   Recurrent deep vein thrombosis (DVT) (  [place])    Bilateral pulmonary embolism ([place]) 12/07/2018   GERD (gastroesophageal reflux disease) 12/07/2018   HTN (hypertension) 12/07/2018   HLD (hyperlipidemia) 12/07/2018   Prostate cancer ([place]) 12/07/2018   Bladder cancer ([place]) 12/07/2018   DM type 2 with diabetic mixed hyperlipidemia ([place]) 04/04/2018   Pain in right hand 10/04/2017   Tubular adenoma 50/35/4656   Helicobacter pylori (H. pylori) infection 09/04/2017   Medicare annual wellness visit, initial 03/31/2017   History of prostate cancer 10/27/2014   Lower urinary tract symptoms (LUTS) 03/18/2012   Nephrolithiasis 03/18/2012    PCP: Rusty Aus, MD  REFERRING PROVIDER: Kris Hartmann, NP  REFERRING DIAG:  Left below knee amputee  THERAPY DIAG:  Gait difficulty  Muscle weakness (generalized)  Left below-knee amputee [facility])  Rationale for Evaluation and Treatment Rehabilitation  ONSET DATE: 03/16/22  SUBJECTIVE:   SUBJECTIVE STATEMENT:   EVALUATION Pt. S/p R BKA after blood clots in lower leg.  Pt. Reports minimum phantom limb symptoms and occasional muscle spasms.  Pt. Takes Baclofen to manage symptoms.  Pt. Received prosthetic leg on Wednesday from [facility]  Pt. Has h/o low back pain and has received spinal epidurals in past (2 years ago).    PERTINENT HISTORY: 1. Hx of BKA, left [facility]) Phillip [name] was seen for further evaluation for left below knee prosthesis.  The is a 77 year old male who had amputation on 03/16/2022.  At the time he is well healed and ready for fitting of new below knee prosthesis.  He is a highly motivated individual and should do well once fitted with prosthesis.  She has no problem returning to a K2 ambulator, which will allow him to walk inside and outside of his home and overload level barriers.    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  He will be having a bone marrow biopsy.  We will continue to have the patient keep his IVC filter for now as there may be further test.  We will have the patient return in 3 months to discuss filter removal.    PAIN:  Are you having pain? No  PRECAUTIONS: None  WEIGHT BEARING RESTRICTIONS No  FALLS:  Has patient fallen in last 6 months? No  LIVING ENVIRONMENT: Lives with: lives with their spouse Lives in: House/apartment Stairs: Yes: Internal: 13+ steps; on right going up Has following equipment at home: Single point cane and Walker - 2 wheeled  OCCUPATION: Retired  PLOF: [place] with walking and return to playing golf by spring.     OBJECTIVE:   PATIENT SURVEYS:  FOTO initial 40/ goal 67.    COGNITION:  Overall  cognitive status: Within functional limits for tasks assessed     SENSATION: WFL  EDEMA:  Circumferential: L/R knee joint line (34/36 cm.), calf (30.5/ 33 cm), distal quad (35/36 cm).    POSTURE: rounded shoulders and forward head  PALPATION: No tenderness/ good incision healing on residual limb  LOWER EXTREMITY ROM:  B LE WFL (all planes).  L knee 0-130 deg.   LOWER EXTREMITY MMT:  R LE muscle strength grossly 5/5 MMT except hip flexion 4+/5 MMT.  L LE strength grossly 5/5 MMT except hip flexion 4/5 MMT and hip abduction 4+/5 MMT.    FUNCTIONAL TESTS:  5 times sit to stand: 14.1 sec. And requires R UE assist.  Able to stand from chair with addition of 2" Airex pad.  GAIT: Distance walked: in clinic/ //-bars Assistive device utilized: Environmental consultant - 2 wheeled Level of assistance: Min A Comments: Ambulates in clinic with RW and min. A/ cuing for proper technique and step pattern.  Pt. Able to ambulate in //-bars with R UE assist only and 2-point gait pattern.    11/27:  5xSTS: 13.04 sec. (No UE assist).  1/11: 5xSTS: 9.47 sec./ 8.19 sec.  R LE muscle strength grossly 5/5 MMT except hip flexion 4+/5 MMT.  L LE strength grossly 5/5 MMT except hip flexion 4/5 MMT and hip abduction 4+/5 MMT.  No significant changes to hip flexor strength noted.    TODAY'S TREATMENT:  09/29/22:  Subjective:  Pt. Reports no new complaints.  Pt. Reports no pain in L LE but states "low back is tight and sore", esp with prolonged sitting.  Pt. States he had a good workout at the [facility] (rode stationary bike/ walked 0.25 miles on TM with incline/ leg press at 80-100#/ LE machines).    There.ex.:  Nustep L6 10 min. B LE only (consistent cadence). Discussed gym ex.  Discussed gym based ex. And h/o low back issues in past.       5xSTS: 9.47 sec./ 8.19 sec.   BOSU: L LE step ups/ wt. Shifting with UE assist in //-bars.    Walking in //-bars: high marching, backwards walking, cone taps with light to no UE  assist and SBA/CGA for safety. (5 laps each).      Walking in clinic/ hallway (3 laps) working on consistent L heel strike/ step length/ increase cadence.    Ascend/ descend 4 stairs x 4 with use of R UE on handrail and recip. Pattern.  Focus on L knee/quad eccentric muscle control while descending step.       Resisted gait 2BTB 5x all 4-planes of movement.  SBA for safety/ cuing.  No LOB but occasional shuffling.      PATIENT EDUCATION:  Education details: Reviewed HEP Person educated: Patient Education method: Explanation, Demonstration, and Handouts Education comprehension: verbalized understanding and returned demonstration   HOME EXERCISE PROGRAM: See handouts.   ASSESSMENT:  CLINICAL IMPRESSION: Patient is highly motivated and hard working during standing/ walking tasks today.  Pt. Walking more consistently around PT clinic without use of SPC and recip. gait pattern.  Minimal UE assist in //-bars with resisted gait/ BOSU for safety.  PT discussed POC with pt. And completing more gym based ex./ walking program.  Pt. Will benefit from skilled PT services to increase LE muscle strength to improve independence with walking/ return to playing golf.       OBJECTIVE IMPAIRMENTS Abnormal gait, decreased activity tolerance, decreased balance, decreased endurance, decreased mobility, difficulty walking, decreased strength, impaired flexibility, improper body mechanics, postural dysfunction, and pain.   ACTIVITY LIMITATIONS carrying, lifting, bending, standing, squatting, stairs, transfers, toileting, dressing, and locomotion level  PARTICIPATION LIMITATIONS: driving, community activity, and yard work  PERSONAL FACTORS Fitness and Past/current experiences are also affecting patient's functional outcome.   REHAB POTENTIAL: Good  CLINICAL DECISION MAKING: Stable/uncomplicated  EVALUATION COMPLEXITY: Low   GOALS: Goals reviewed with patient? Yes  SHORT TERM GOALS: Target date:  09/27/22 Pt. Independent with HEP to increase B hip strength 1/2 muscle grade to improve standing/walking mod. Independence.   Baseline:  see above.  L hip flexion 4/5 MMT.  Goal status: Goal met    LONG TERM GOALS: Target date: 10/25/22  Pt. Will increase FOTO to 59 to improve functional mobility.   Baseline:  initial FOTO 40.  11/8: 68 Goal status: Goal met  2.  Pt. Able to ascend/descend 13 stairs with R handrail and consistent step pattern to improve pts. Ability to go to 2nd floor of home.  11/8: recip. Gait on stairs outside with R handrail for safety.   Baseline:  Goal status: Goal met  3.  Pt. Will ambulate with mod. I and SPC with consistent 2-point gait pattern and no loss of balance to improve independence at home/ community.   Baseline:  11/8: pt. Ambulates community distances Goal status: Goal met  4.  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    Baseline:  pt. Requires extra time/ SPC while walking outside and limited with carrying  5.  Pt. Able to return to hitting golf balls/ playing round of golf with no limitations safely.    Baseline:  pt. Demonstrates chipping golf balls in gym with SBA/CGA    PLAN: PT FREQUENCY: 2x/week  PT DURATION: 8 weeks  PLANNED INTERVENTIONS: Therapeutic exercises, Therapeutic activity, Neuromuscular re-education, Balance training, Gait training, Patient/Family education, Self Care, Joint mobilization, Stair training, Prosthetic training, Manual therapy, and Re-evaluation  PLAN FOR NEXT SESSION: Progress gait with least assistive device.  Simulate golf swing outside.    Pura Spice, PT, DPT # [phone] 09/29/2022, 9:19 AM

## 2022-10-03 DIAGNOSIS — I2699 Other pulmonary embolism without acute cor pulmonale: Secondary | ICD-10-CM | POA: Diagnosis not present

## 2022-10-03 DIAGNOSIS — T81718S Complication of other artery following a procedure, not elsewhere classified, sequela: Secondary | ICD-10-CM | POA: Diagnosis not present

## 2022-10-03 DIAGNOSIS — M5116 Intervertebral disc disorders with radiculopathy, lumbar region: Secondary | ICD-10-CM | POA: Diagnosis not present

## 2022-10-04 ENCOUNTER — Encounter: Payer: Medicare HMO | Admitting: Physical Therapy

## 2022-10-06 ENCOUNTER — Ambulatory Visit: Payer: HMO | Admitting: Physical Therapy

## 2022-10-06 DIAGNOSIS — Z89512 Acquired absence of left leg below knee: Secondary | ICD-10-CM

## 2022-10-06 DIAGNOSIS — R269 Unspecified abnormalities of gait and mobility: Secondary | ICD-10-CM | POA: Diagnosis not present

## 2022-10-06 DIAGNOSIS — M6281 Muscle weakness (generalized): Secondary | ICD-10-CM

## 2022-10-06 NOTE — Therapy (Signed)
OUTPATIENT PHYSICAL THERAPY LOWER EXTREMITY TREATMENT  Patient Name: Phillip [name] MRN: [id number] DOB:[birth year], 77 y.o., male Today's Date: 10/06/2022    PT End of Session - 10/06/22 0819     Visit Number 21    Number of Visits 30    Date for PT Re-Evaluation 10/25/22    PT Start Time 0820    PT Stop Time 0917    PT Time Calculation (min) 57 min    Equipment Utilized During Treatment Gait belt    Activity Tolerance Patient tolerated treatment well    Behavior During Therapy WFL for tasks assessed/performed             Past Medical History:  Diagnosis Date   Arthritis    Basal cell carcinoma    L clavicle, L prox forearm- removed years ago    Bladder cancer ([place])    Bladder neck contracture    CAD (coronary artery disease)    a. 12/2020 NSTEMI/Cath: LM nl, LAD 95ost/p, LCX 50p, RCA nl. EF 45-50%.   Cataract    CKD (chronic kidney disease), stage III [facility])    ED (erectile dysfunction)    Frequency    GERD (gastroesophageal reflux disease)    History of pulmonary embolus (PE) 11/2018   a. Following LE DVT-->Chronic warfarin.   Hyperlipidemia LDL goal <70    Hypertension    Hypothyroidism    Incontinence of urine    sui, s/p cryoablation   Ischemic cardiomyopathy    a. 11/2018 Echo: EF 60-65%; b. 12/2020 LV Gram: EF 45-50% in setting of NSTEMI.   Kidney stones    Neuropathy    feet   Nocturia    Prostate cancer ([place])    S/P   CRYOABLATION   Right elbow tendinitis    Right Lower Extremity DVT (deep venous thrombosis) ([place]) 11/25/2018   Vertigo    1-2x/yr   Wears glasses    Past Surgical History:  Procedure Laterality Date   AMPUTATION Left 03/16/2022   Procedure: AMPUTATION BELOW KNEE;  Surgeon: Katha Cabal, MD;  Location: [facility];  Service: Vascular;  Laterality: Left;   ARTHROPLASTY AND TENDON REPAIR LEFT THUMB  JAN 2014   CATARACT EXTRACTION W/PHACO Left 11/16/2020   Procedure: CATARACT EXTRACTION PHACO AND INTRAOCULAR LENS PLACEMENT (IOC) LEFT   7.09 00:56.4 12.6%;  Surgeon: Leandrew Koyanagi, MD;  Location: [place];  Service: Ophthalmology;  Laterality: Left;   CATARACT EXTRACTION W/PHACO Right 12/02/2020   Procedure: CATARACT EXTRACTION PHACO AND INTRAOCULAR LENS PLACEMENT (IOC) RIGHT MALYUGIN 5.52 00:49.7 11.1%;  Surgeon: Leandrew Koyanagi, MD;  Location: [place];  Service: Ophthalmology;  Laterality: Right;   COLONOSCOPY     COLONOSCOPY WITH PROPOFOL N/A 08/09/2017   Procedure: COLONOSCOPY WITH PROPOFOL;  Surgeon: Manya Silvas, MD;  Location: [facility];  Service: Endoscopy;  Laterality: N/A;   CORONARY ANGIOGRAPHY N/A 12/28/2020   Procedure: CORONARY ANGIOGRAPHY;  Surgeon: Sherren Mocha, MD;  Location: [place] CV LAB;  Service: Cardiovascular;  Laterality: N/A;   CORONARY STENT INTERVENTION N/A 12/28/2020   Procedure: CORONARY STENT INTERVENTION;  Surgeon: Sherren Mocha, MD;  Location: [place] CV LAB;  Service: Cardiovascular;  Laterality: N/A;   ESOPHAGOGASTRODUODENOSCOPY (EGD) WITH PROPOFOL N/A 08/09/2017   Procedure: ESOPHAGOGASTRODUODENOSCOPY (EGD) WITH PROPOFOL;  Surgeon: Manya Silvas, MD;  Location: [facility];  Service: Endoscopy;  Laterality: N/A;   GREEN LIGHT LASER TURP (TRANSURETHRAL RESECTION OF PROSTATE N/A 12/14/2015   Procedure: GREEN LIGHT LASER TURP (TRANSURETHRAL RESECTION OF PROSTATE)  LASER OF BLADDER NECK CONTRACTURE;  Surgeon: Carolan Clines, MD;  Location: [facility];  Service: Urology;  Laterality: N/A;   IVC FILTER INSERTION N/A 03/19/2022   Procedure: IVC FILTER INSERTION;  Surgeon: Serafina Mitchell, MD;  Location: [place] CV LAB;  Service: Cardiovascular;  Laterality: N/A;   LEFT HEART CATH AND CORONARY ANGIOGRAPHY N/A 12/24/2020   Procedure: LEFT HEART CATH AND CORONARY ANGIOGRAPHY and possible PCI and stent;  Surgeon: Yolonda Kida, MD;  Location: [place] CV LAB;  Service: Cardiovascular;  Laterality: N/A;   LEFT HEART  CATH AND CORONARY ANGIOGRAPHY N/A 01/06/2021   Procedure: LEFT HEART CATH AND CORONARY ANGIOGRAPHY;  Surgeon: Troy Sine, MD;  Location: [place] CV LAB;  Service: Cardiovascular;  Laterality: N/A;   LOWER EXTREMITY ANGIOGRAPHY Left 03/11/2022   Procedure: Lower Extremity Angiography;  Surgeon: Katha Cabal, MD;  Location: [place] CV LAB;  Service: Cardiovascular;  Laterality: Left;   PENILE PROSTHESIS IMPLANT N/A 12/06/2013   Procedure: PENILE PROTHESIS INFLATABLE;  Surgeon: Ailene Rud, MD;  Location: [facility];  Service: Urology;  Laterality: N/A;   PROSTATE CRYOABLATION  06-01-2012  [facility]   PULMONARY THROMBECTOMY N/A 03/18/2022   Procedure: PULMONARY THROMBECTOMY;  Surgeon: Algernon Huxley, MD;  Location: [place] CV LAB;  Service: Cardiovascular;  Laterality: N/A;   PULMONARY THROMBECTOMY Bilateral 03/23/2022   Procedure: PULMONARY THROMBECTOMY;  Surgeon: Katha Cabal, MD;  Location: [place] CV LAB;  Service: Cardiovascular;  Laterality: Bilateral;   THROMBECTOMY ILIAC ARTERY Left 03/12/2022   Procedure: THROMBECTOMY LEG;  Surgeon: Conrad [place], MD;  Location: [facility];  Service: Vascular;  Laterality: Left;   TRANSURETHRAL RESECTION OF BLADDER NECK N/A 03/20/2015   Procedure: RELEASE BLADDER NECK CONTRACTURE  WITH WOLF BUTTON ELECTRODE ;  Surgeon: Carolan Clines, MD;  Location: [place];  Service: Urology;  Laterality: N/A;   TRANSURETHRAL RESECTION OF BLADDER TUMOR N/A 12/05/2018   Procedure: TRANSURETHRAL RESECTION OF BLADDER TUMOR (TURBT)/CYSTOSCOPY/  INSTILLATION OF Rodell Perna;  Surgeon: Ceasar Mons, MD;  Location: [facility];  Service: Urology;  Laterality: N/A;   TRANSURETHRAL RESECTION OF BLADDER TUMOR N/A 01/15/2020   Procedure: TRANSURETHRAL RESECTION OF BLADDER TUMOR (TURBT)/ CYSTOSCOPY;  Surgeon: Ceasar Mons, MD;  Location: [facility];  Service:  Urology;  Laterality: N/A;   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     Patient Active Problem List   Diagnosis Date Noted   Iron deficiency anemia 06/20/2022   Myeloproliferative disorder ([place]) 05/19/2022   History of bladder cancer 05/19/2022   Goals of care, counseling/discussion 03/23/2022   Reactive thrombocytosis 03/21/2022   Paroxysmal atrial fibrillation with RVR ([place]) 03/20/2022   Acute bilateral deep vein thrombosis (DVT) of femoral veins (HCC) 03/20/2022   Acute respiratory failure with hypoxia (HCC) 03/16/2022   Acute on chronic diastolic CHF (congestive heart failure) ([place]) 03/16/2022   PSVT (paroxysmal supraventricular tachycardia) 03/16/2022   AKI (acute kidney injury) ([place]) 03/15/2022   Critical limb ischemia of left lower extremity with ulceration of lower leg ([place]) 03/15/2022   Arterial occlusion 03/11/2022   Elevated troponin    Unstable angina ([place]) 01/05/2021   Hyperlipidemia LDL goal <70    CKD (chronic kidney disease), stage III (HCC)    Coronary artery disease    NSTEMI (non-ST elevated myocardial infarction) ([place]) 12/23/2020   Acquired hypothyroidism 04/20/2020   Chemotherapy-induced neuropathy ([place]) 04/20/2020   Nonthrombocytopenic purpura ([place])  12/03/2019   Hypercoagulable state ([place]) 12/10/2018   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 pylori) infection 09/04/2017   Medicare annual wellness visit, initial 03/31/2017   History of prostate cancer 10/27/2014   Lower urinary tract symptoms (LUTS) 03/18/2012   Nephrolithiasis 03/18/2012    PCP:  Rusty Aus, MD  REFERRING PROVIDER: Kris Hartmann, NP  REFERRING DIAG: Left below knee amputee  THERAPY DIAG:  Gait difficulty  Muscle weakness (generalized)  Left below-knee amputee [facility])  Rationale for Evaluation and Treatment Rehabilitation  ONSET DATE: 03/16/22  SUBJECTIVE:   SUBJECTIVE STATEMENT:   EVALUATION Pt. S/p R BKA after blood clots in lower leg.  Pt. Reports minimum phantom limb symptoms and occasional muscle spasms.  Pt. Takes Baclofen to manage symptoms.  Pt. Received prosthetic leg on Wednesday from [facility].  Pt. Has h/o low back pain and has received spinal epidurals in past (2 years ago).    PERTINENT HISTORY: 1. Hx of BKA, left [facility]) Mr. Jennie Bolar was seen for further evaluation for left below knee prosthesis.  The is a 77 year old male who had amputation on 03/16/2022.  At the time he is well healed and ready for fitting of new below knee prosthesis.  He is a highly motivated individual and should do well once fitted with prosthesis.  She has no problem returning to a K2 ambulator, which will allow him to walk inside and outside of his home and overload level barriers.    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  He will be having a bone marrow biopsy.  We will continue to have the patient keep his IVC filter for now as there may be further test.  We will have the patient return in 3 months to discuss filter removal.    PAIN:  Are you having pain? No  PRECAUTIONS: None  WEIGHT BEARING RESTRICTIONS No  FALLS:  Has patient fallen in last 6 months? No  LIVING ENVIRONMENT: Lives with: lives with their spouse Lives in: House/apartment Stairs: Yes: Internal: 13+ steps; on right going up Has following equipment at home: Single point cane and Walker - 2 wheeled  OCCUPATION: Retired  PLOF: [place] with walking and return to playing golf by spring.      OBJECTIVE:   PATIENT SURVEYS:  FOTO initial 40/ goal 56.    COGNITION:  Overall cognitive status: Within functional limits for tasks assessed     SENSATION: WFL  EDEMA:  Circumferential: L/R knee joint line (34/36 cm.), calf (30.5/ 33 cm), distal quad (35/36 cm).    POSTURE: rounded shoulders and forward head  PALPATION: No tenderness/ good incision healing on residual limb  LOWER EXTREMITY ROM:  B LE WFL (all planes).  L knee 0-130 deg.   LOWER EXTREMITY MMT:  R LE muscle strength grossly 5/5 MMT except hip flexion 4+/5 MMT.  L LE strength grossly 5/5 MMT except hip flexion 4/5 MMT and hip abduction 4+/5 MMT.    FUNCTIONAL TESTS:  5 times sit to stand: 14.1 sec. And requires R UE assist.  Able to stand from chair with addition of 2" Airex pad.    GAIT: Distance walked: in clinic/ //-bars Assistive device utilized: Environmental consultant - 2 wheeled Level of assistance: Min A Comments: Ambulates in clinic with RW and min. A/ cuing for proper technique and step pattern.  Pt. Able to ambulate in //-bars with R UE assist only and 2-point gait pattern.    11/27:  5xSTS: 13.04 sec. (No UE assist).  1/11: 5xSTS: 9.47 sec./ 8.19 sec.  R LE muscle strength grossly 5/5 MMT except hip flexion 4+/5 MMT.  L LE strength grossly 5/5 MMT except hip flexion 4/5 MMT and hip abduction 4+/5 MMT.  No significant changes to hip flexor strength noted.    TODAY'S TREATMENT:  10/06/22:  Subjective:  Pt. Reports no new complaints.  Pt. Reports no pain in L LE. Pt. Received a Cortisone shot Monday and reports "his back feels great today". Pt. States he has attended [employer] 4 x since last therapy session.    There.ex.:  Nustep L6 10 min. B LE only (consistent cadence). Discussed gym ex.    B Hip abd./ext. w/ 4 lb. Ankle wts. 2 x 15 in //-bars for UE support  Walking in //-bars: high marching. (5 laps). Cued to work on slowing down and march slow and controlled.   Hip flex. With 4lb. Ankle wt. in  //-bars for UE support. & 2 sec. Holds at the top 2 x 15  Walking in clinic/ hallway (4 laps) working on consistent L heel strike/ step length/ increase cadence.   Alternating knee to contralateral hand knee taps in hallway 2 laps. Occasional LOB.   Ascend/ descend 4 stairs x 4 with use of R UE on handrail and recip. Pattern.  Focus on L knee/quad eccentric muscle control while descending step.       Resisted gait 2BTB 5x SBA for safety/ cuing.  No LOB but occasional shuffling.      PATIENT EDUCATION:  Education details: Reviewed HEP Person educated: Patient Education method: Explanation, Demonstration, and Handouts Education comprehension: verbalized understanding and returned demonstration   HOME EXERCISE PROGRAM: See handouts.   ASSESSMENT:  CLINICAL IMPRESSION: Patient is highly motivated and hard working during standing/ walking tasks today.  Pt. Arrived to clinic without use of SPC demonstrating equal stride length and no LOB. Minimal UE assist in //-bars with resisted gait/for safety.  PT cued pt. To resist momentum and use controlled movements to activate hip muscles when performing advanced gait activities. PT discussed completing more gym based ex. With pt. Pt. Will benefit from skilled PT services to increase LE muscle strength to improve independence with walking/ return to playing golf.       OBJECTIVE IMPAIRMENTS Abnormal gait, decreased activity tolerance, decreased balance, decreased endurance, decreased mobility, difficulty walking, decreased strength, impaired flexibility, improper body mechanics, postural dysfunction, and pain.   ACTIVITY LIMITATIONS carrying, lifting, bending, standing, squatting, stairs, transfers, toileting, dressing, and locomotion level  PARTICIPATION LIMITATIONS: driving, community activity, and yard work  PERSONAL FACTORS Fitness and Past/current experiences are also affecting patient's functional outcome.   REHAB POTENTIAL:  Good  CLINICAL DECISION MAKING: Stable/uncomplicated  EVALUATION COMPLEXITY: Low   GOALS: Goals reviewed with patient? Yes  SHORT TERM GOALS: Target date: 09/27/22 Pt. Independent with HEP to increase B hip strength 1/2 muscle grade to improve standing/walking mod. Independence.   Baseline:  see above.  L hip flexion 4/5 MMT.  Goal status: Goal met    LONG TERM GOALS: Target date: 10/25/22  Pt. Will increase FOTO to 59 to improve functional mobility.   Baseline:  initial FOTO 40.  11/8: 68 Goal status: Goal met  2.  Pt. Able to ascend/descend 13 stairs with R handrail and consistent step pattern to improve pts. Ability to go to 2nd floor of home.  11/8: recip. Gait on stairs outside with R handrail for safety.   Baseline:  Goal status: Goal met  3.  Pt. Will ambulate with mod. I and SPC with consistent 2-point gait pattern and no loss of balance to improve independence at home/ community.   Baseline:  11/8: pt. Ambulates community distances Goal status: Goal met  4.  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    Baseline:  pt. Requires extra time/ SPC while walking outside and limited with carrying  5.  Pt. Able to return to hitting golf balls/ playing round of golf with no limitations safely.    Baseline:  pt. Demonstrates chipping golf balls in gym with SBA/CGA    PLAN: PT FREQUENCY: 2x/week  PT DURATION: 8 weeks  PLANNED INTERVENTIONS: Therapeutic exercises, Therapeutic activity, Neuromuscular re-education, Balance training, Gait training, Patient/Family education, Self Care, Joint mobilization, Stair training, Prosthetic training, Manual therapy, and Re-evaluation  PLAN FOR NEXT SESSION: Progress gait with least assistive device.  Simulate golf swing outside.    Shavontae Gibeault B. Rogers Blocker, SPT Pura Spice, PT, DPT # [phone] 10/06/2022, 9:27 AM

## 2022-10-11 ENCOUNTER — Encounter: Payer: Self-pay | Admitting: Physical Therapy

## 2022-10-11 ENCOUNTER — Ambulatory Visit: Payer: HMO | Admitting: Physical Therapy

## 2022-10-11 DIAGNOSIS — R269 Unspecified abnormalities of gait and mobility: Secondary | ICD-10-CM | POA: Diagnosis not present

## 2022-10-11 DIAGNOSIS — Z89512 Acquired absence of left leg below knee: Secondary | ICD-10-CM

## 2022-10-11 DIAGNOSIS — M6281 Muscle weakness (generalized): Secondary | ICD-10-CM

## 2022-10-11 NOTE — Therapy (Signed)
OUTPATIENT PHYSICAL THERAPY LOWER EXTREMITY TREATMENT  Patient Name: Phillip [name] MRN: [id number] DOB:1946/07/21, 77 y.o., male Today's Date: 10/11/2022    PT End of Session - 10/11/22 0817     Visit Number 22    Number of Visits 30    Date for PT Re-Evaluation 10/25/22    PT Start Time 0814    PT Stop Time 0904    PT Time Calculation (min) 50 min    Equipment Utilized During Treatment Gait belt    Activity Tolerance Patient tolerated treatment well    Behavior During Therapy WFL for tasks assessed/performed             Past Medical History:  Diagnosis Date   Arthritis    Basal cell carcinoma    L clavicle, L prox forearm- removed years ago    Bladder cancer ([place])    Bladder neck contracture    CAD (coronary artery disease)    a. 12/2020 NSTEMI/Cath: LM nl, LAD 95ost/p, LCX 50p, RCA nl. EF 45-50%.   Cataract    CKD (chronic kidney disease), stage III [facility]    ED (erectile dysfunction)    Frequency    GERD (gastroesophageal reflux disease)    History of pulmonary embolus (PE) 11/2018   a. Following LE DVT-->Chronic warfarin.   Hyperlipidemia LDL goal <70    Hypertension    Hypothyroidism    Incontinence of urine    sui, s/p cryoablation   Ischemic cardiomyopathy    a. 11/2018 Echo: EF 60-65%; b. 12/2020 LV Gram: EF 45-50% in setting of NSTEMI.   Kidney stones    Neuropathy    feet   Nocturia    Prostate cancer ([place])    S/P   CRYOABLATION   Right elbow tendinitis    Right Lower Extremity DVT (deep venous thrombosis) ([place]) 11/25/2018   Vertigo    1-2x/yr   Wears glasses    Past Surgical History:  Procedure Laterality Date   AMPUTATION Left 03/16/2022   Procedure: AMPUTATION BELOW KNEE;  Surgeon: Katha Cabal, MD;  Location: [facility];  Service: Vascular;  Laterality: Left;   ARTHROPLASTY AND TENDON REPAIR LEFT THUMB  JAN 2014   CATARACT EXTRACTION W/PHACO Left 11/16/2020   Procedure: CATARACT EXTRACTION PHACO AND INTRAOCULAR LENS PLACEMENT (IOC) LEFT   7.09 00:56.4 12.6%;  Surgeon: Leandrew Koyanagi, MD;  Location: [place];  Service: Ophthalmology;  Laterality: Left;   CATARACT EXTRACTION W/PHACO Right 12/02/2020   Procedure: CATARACT EXTRACTION PHACO AND INTRAOCULAR LENS PLACEMENT (IOC) RIGHT MALYUGIN 5.52 00:49.7 11.1%;  Surgeon: Leandrew Koyanagi, MD;  Location: [place];  Service: Ophthalmology;  Laterality: Right;   COLONOSCOPY     COLONOSCOPY WITH PROPOFOL N/A 08/09/2017   Procedure: COLONOSCOPY WITH PROPOFOL;  Surgeon: Manya Silvas, MD;  [facility];  Service: Endoscopy;  Laterality: N/A;   CORONARY ANGIOGRAPHY N/A 12/28/2020   Procedure: CORONARY ANGIOGRAPHY;  Surgeon: Sherren Mocha, MD;  Location: [place] CV LAB;  Service: Cardiovascular;  Laterality: N/A;   CORONARY STENT INTERVENTION N/A 12/28/2020   Procedure: CORONARY STENT INTERVENTION;  Surgeon: Sherren Mocha, MD;  Location: [place] [facility];  Service: Cardiovascular;  Laterality: N/A;   ESOPHAGOGASTRODUODENOSCOPY (EGD) WITH PROPOFOL N/A 08/09/2017   Procedure: ESOPHAGOGASTRODUODENOSCOPY (EGD) WITH PROPOFOL;  Surgeon: Manya Silvas, MD;  Location: [facility] ENDOSCOPY;  Service: Endoscopy;  Laterality: N/A;   GREEN LIGHT LASER TURP (TRANSURETHRAL RESECTION OF PROSTATE N/A 12/14/2015   Procedure: GREEN LIGHT LASER TURP (TRANSURETHRAL RESECTION OF PROSTATE)  LASER OF BLADDER NECK CONTRACTURE;  Surgeon: Carolan Clines, MD;  Location: [facility];  Service: Urology;  Laterality: N/A;   IVC FILTER INSERTION N/A 03/19/2022   Procedure: IVC FILTER INSERTION;  Surgeon: Serafina Mitchell, MD;  Location: [place] [facility] LAB;  Service: Cardiovascular;  Laterality: N/A;   LEFT HEART CATH AND CORONARY ANGIOGRAPHY N/A 12/24/2020   Procedure: LEFT HEART CATH AND CORONARY ANGIOGRAPHY and possible PCI and stent;  Surgeon: Yolonda Kida, MD;  Location: [place] [facility];  Service: Cardiovascular;  Laterality: N/A;   LEFT HEART  CATH AND CORONARY ANGIOGRAPHY N/A 01/06/2021   Procedure: LEFT HEART CATH AND CORONARY ANGIOGRAPHY;  Surgeon: Troy Sine, MD;  Location: [place] [facility];  Service: Cardiovascular;  Laterality: N/A;   LOWER EXTREMITY ANGIOGRAPHY Left 03/11/2022   Procedure: Lower Extremity Angiography;  Surgeon: Katha Cabal, MD;  Location: [place] [facility];  Service: Cardiovascular;  Laterality: Left;   PENILE PROSTHESIS IMPLANT N/A 12/06/2013   Procedure: PENILE PROTHESIS INFLATABLE;  Surgeon: Ailene Rud, MD;  Location: [facility];  Service: Urology;  Laterality: N/A;   PROSTATE CRYOABLATION  06-01-2012  [place]   PULMONARY THROMBECTOMY N/A 03/18/2022   Procedure: PULMONARY THROMBECTOMY;  Surgeon: Algernon Huxley, MD;  Location: [place] [facility];  Service: Cardiovascular;  Laterality: N/A;   PULMONARY THROMBECTOMY Bilateral 03/23/2022   Procedure: PULMONARY THROMBECTOMY;  Surgeon: Katha Cabal, MD;  Location: [place] [facility];  Service: Cardiovascular;  Laterality: Bilateral;   THROMBECTOMY ILIAC ARTERY Left 03/12/2022   Procedure: THROMBECTOMY LEG;  Surgeon: Conrad [place], MD;  Location: [facility];  Service: Vascular;  Laterality: Left;   TRANSURETHRAL RESECTION OF BLADDER NECK N/A 03/20/2015   Procedure: RELEASE BLADDER NECK CONTRACTURE  WITH WOLF BUTTON ELECTRODE ;  Surgeon: Carolan Clines, MD;  Location: [place];  Service: Urology;  Laterality: N/A;   TRANSURETHRAL RESECTION OF BLADDER TUMOR N/A 12/05/2018   Procedure: TRANSURETHRAL RESECTION OF BLADDER TUMOR (TURBT)/CYSTOSCOPY/  INSTILLATION OF Rodell Perna;  Surgeon: Ceasar Mons, MD;  Location: [facility];  Service: Urology;  Laterality: N/A;   TRANSURETHRAL RESECTION OF BLADDER TUMOR N/A 01/15/2020   Procedure: TRANSURETHRAL RESECTION OF BLADDER TUMOR (TURBT)/ CYSTOSCOPY;  Surgeon: Ceasar Mons, MD;  Location: [facility];  Service:  Urology;  Laterality: N/A;   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     Patient Active Problem List   Diagnosis Date Noted   Iron deficiency anemia 06/20/2022   Myeloproliferative disorder ([place]) 05/19/2022   History of bladder cancer 05/19/2022   Goals of care, counseling/discussion 03/23/2022   Reactive thrombocytosis 03/21/2022   Paroxysmal atrial fibrillation with RVR ([place]) 03/20/2022   Acute bilateral deep vein thrombosis (DVT) of femoral veins (HCC) 03/20/2022   Acute respiratory failure with hypoxia (HCC) 03/16/2022   Acute on chronic diastolic CHF (congestive heart failure) ([place]) 03/16/2022   PSVT (paroxysmal supraventricular tachycardia) 03/16/2022   AKI (acute kidney injury) ([place]) 03/15/2022   Critical limb ischemia of left lower extremity with ulceration of lower leg ([place]) 03/15/2022   Arterial occlusion 03/11/2022   Elevated troponin    Unstable angina ([place]) 01/05/2021   Hyperlipidemia LDL goal <70    CKD (chronic kidney disease), stage III (HCC)    Coronary artery disease    NSTEMI (non-ST elevated myocardial infarction) ([place]) 12/23/2020   Acquired hypothyroidism 04/20/2020   Chemotherapy-induced neuropathy ([place]) 04/20/2020   Nonthrombocytopenic purpura ([place])  12/03/2019   Hypercoagulable state ([place]) 12/10/2018   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 pylori) infection 09/04/2017   Medicare annual wellness visit, initial 03/31/2017   History of prostate cancer 10/27/2014   Lower urinary tract symptoms (LUTS) 03/18/2012   Nephrolithiasis 03/18/2012    PCP:  Rusty Aus, MD  REFERRING PROVIDER: Kris Hartmann, NP  REFERRING DIAG: Left below knee amputee  THERAPY DIAG:  Gait difficulty  Muscle weakness (generalized)  Left below-knee amputee [facility])  Rationale for Evaluation and Treatment Rehabilitation  ONSET DATE: 03/16/22  SUBJECTIVE:   SUBJECTIVE STATEMENT:   EVALUATION Pt. S/p R BKA after blood clots in lower leg.  Pt. Reports minimum phantom limb symptoms and occasional muscle spasms.  Pt. Takes Baclofen to manage symptoms.  Pt. Received prosthetic leg on Wednesday from [facility].  Pt. Has h/o low back pain and has received spinal epidurals in past (2 years ago).    PERTINENT HISTORY: 1. Hx of BKA, left [facility] Mr. Jamaree Hosier was seen for further evaluation for left below knee prosthesis.  The is a 77 year old male who had amputation on 03/16/2022.  At the time he is well healed and ready for fitting of new below knee prosthesis.  He is a highly motivated individual and should do well once fitted with prosthesis.  She has no problem returning to a K2 ambulator, which will allow him to walk inside and outside of his home and overload level barriers.    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  He will be having a bone marrow biopsy.  We will continue to have the patient keep his IVC filter for now as there may be further test.  We will have the patient return in 3 months to discuss filter removal.    PAIN:  Are you having pain? No  PRECAUTIONS: None  WEIGHT BEARING RESTRICTIONS No  FALLS:  Has patient fallen in last 6 months? No  LIVING ENVIRONMENT: Lives with: lives with their spouse Lives in: House/apartment Stairs: Yes: Internal: 13+ steps; on right going up Has following equipment at home: Single point cane and Walker - 2 wheeled  OCCUPATION: Retired  PLOF: [place] with walking and return to playing golf by spring.      OBJECTIVE:   PATIENT SURVEYS:  FOTO initial 40/ goal 41.    COGNITION:  Overall cognitive status: Within functional limits for tasks assessed     SENSATION: WFL  EDEMA:  Circumferential: L/R knee joint line (34/36 cm.), calf (30.5/ 33 cm), distal quad (35/36 cm).    POSTURE: rounded shoulders and forward head  PALPATION: No tenderness/ good incision healing on residual limb  LOWER EXTREMITY ROM:  B LE WFL (all planes).  L knee 0-130 deg.   LOWER EXTREMITY MMT:  R LE muscle strength grossly 5/5 MMT except hip flexion 4+/5 MMT.  L LE strength grossly 5/5 MMT except hip flexion 4/5 MMT and hip abduction 4+/5 MMT.    FUNCTIONAL TESTS:  5 times sit to stand: 14.1 sec. And requires R UE assist.  Able to stand from chair with addition of 2" Airex pad.    GAIT: Distance walked: in clinic/ //-bars Assistive device utilized: Environmental consultant - 2 wheeled Level of assistance: Min A Comments: Ambulates in clinic with RW and min. A/ cuing for proper technique and step pattern.  Pt. Able to ambulate in //-bars with R UE assist only and 2-point gait pattern.    11/27:  5xSTS: 13.04 sec. (No UE assist).  1/11: 5xSTS: 9.47 sec./ 8.19 sec.  R LE muscle strength grossly 5/5 MMT except hip flexion 4+/5 MMT.  L LE strength grossly 5/5 MMT except hip flexion 4/5 MMT and hip abduction 4+/5 MMT.  No significant changes to hip flexor strength noted.    TODAY'S TREATMENT:  10/11/22:  Subjective:  Pt. Reports no new complaints.  Pt. Increased wt. On leg press at gym.  No c/o back pain walking into PT clinic.    There.ex.:  Scifit L5 10 min. B LE only (consistent cadence).     Walking in clinic/ hallway (4 laps) working on consistent L heel strike/ step length/ increase cadence.  5# ankle wts.: LAQ/ standing B Hip abd./ext./marching/ hamstring curls with light UE support 10x2.  Ascending/ descending stairs with eccentric L quad control focus (5# ankle wt).    Walking in //-bars (5# ankle wt):  high marching. (5 laps). Cued to work on slowing down and march slow and controlled.    Walking in hallway/ outside with curb ascending/ descending/ grass/ mulch terrain with no assistive device.  SBA for cuing and instruction.       PATIENT EDUCATION:  Education details: Reviewed HEP Person educated: Patient Education method: Explanation, Demonstration, and Handouts Education comprehension: verbalized understanding and returned demonstration   HOME EXERCISE PROGRAM: See handouts.   ASSESSMENT:  CLINICAL IMPRESSION: Patient is highly motivated and hard working during standing/ walking tasks today.  PT cued pt. To resist momentum and use controlled movements to activate hip muscles when performing advanced gait activities with ankle wts today.  PT discussed completing more gym based ex. With pt. Pt. Will benefit from skilled PT services to increase LE muscle strength to improve independence with walking/ return to playing golf.       OBJECTIVE IMPAIRMENTS Abnormal gait, decreased activity tolerance, decreased balance, decreased endurance, decreased mobility, difficulty walking, decreased strength, impaired flexibility, improper body mechanics, postural dysfunction, and pain.   ACTIVITY LIMITATIONS carrying, lifting, bending, standing, squatting, stairs, transfers, toileting, dressing, and locomotion level  PARTICIPATION LIMITATIONS: driving, community activity, and yard work  PERSONAL FACTORS Fitness and Past/current experiences are also affecting patient's functional outcome.   REHAB POTENTIAL: Good  CLINICAL DECISION MAKING: Stable/uncomplicated  EVALUATION COMPLEXITY: Low   GOALS: Goals reviewed with patient? Yes  SHORT TERM GOALS: Target date: 09/27/22 Pt. Independent with HEP to increase B hip strength 1/2 muscle grade to improve standing/walking mod. Independence.   Baseline:  see above.  L hip flexion 4/5 MMT.  Goal status: Goal met    LONG TERM GOALS: Target date:  10/25/22  Pt. Will increase FOTO to 59 to improve functional mobility.   Baseline:  initial FOTO 40.  11/8: 68 Goal status: Goal met  2.  Pt. Able to ascend/descend 13 stairs with R handrail and consistent step pattern to improve pts. Ability to go to 2nd floor of home.  11/8: recip. Gait on stairs outside with R handrail for safety.   Baseline:  Goal status: Goal met  3.  Pt. Will ambulate with mod. I and  SPC with consistent 2-point gait pattern and no loss of balance to improve independence at home/ community.   Baseline:  11/8: pt. Ambulates community distances Goal status: Goal met  4.  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    Baseline:  pt. Requires extra time/ SPC while walking outside and limited with carrying  5.  Pt. Able to return to hitting golf balls/ playing round of golf with no limitations safely.    Baseline:  pt. Demonstrates chipping golf balls in gym with SBA/CGA    PLAN: PT FREQUENCY: 2x/week  PT DURATION: 8 weeks  PLANNED INTERVENTIONS: Therapeutic exercises, Therapeutic activity, Neuromuscular re-education, Balance training, Gait training, Patient/Family education, Self Care, Joint mobilization, Stair training, Prosthetic training, Manual therapy, and Re-evaluation  PLAN FOR NEXT SESSION: Progress gait with least assistive device.  Discuss golfing.    Pura Spice, PT, DPT # [phone] 10/11/2022, 9:23 AM

## 2022-10-12 ENCOUNTER — Telehealth (INDEPENDENT_AMBULATORY_CARE_PROVIDER_SITE_OTHER): Payer: Self-pay

## 2022-10-12 NOTE — Telephone Encounter (Unsigned)
Spoke with the patient and he is scheduled with Dr. Delana Meyer on 11/01/22 with [facility] 6:[facility]. Pre-procedure instructions were discussed and will be mailed

## 2022-10-13 ENCOUNTER — Encounter: Payer: Self-pay | Admitting: Physical Therapy

## 2022-10-13 ENCOUNTER — Ambulatory Visit: Payer: HMO | Admitting: Physical Therapy

## 2022-10-13 DIAGNOSIS — Z89512 Acquired absence of left leg below knee: Secondary | ICD-10-CM

## 2022-10-13 DIAGNOSIS — M6281 Muscle weakness (generalized): Secondary | ICD-10-CM

## 2022-10-13 DIAGNOSIS — R269 Unspecified abnormalities of gait and mobility: Secondary | ICD-10-CM | POA: Diagnosis not present

## 2022-10-13 NOTE — Therapy (Signed)
OUTPATIENT PHYSICAL THERAPY LOWER EXTREMITY TREATMENT  Patient Name: Phillip [name] MRN: [id number] DOB:[birth year], 77 y.o., male Today's Date: 10/13/2022    PT End of Session - 10/13/22 0821     Visit Number 23    Number of Visits 30    Date for PT Re-Evaluation 10/25/22    PT Start Time 0818    PT Stop Time 0904    PT Time Calculation (min) 46 min    Equipment Utilized During Treatment Gait belt    Activity Tolerance Patient tolerated treatment well    Behavior During Therapy WFL for tasks assessed/performed             Past Medical History:  Diagnosis Date   Arthritis    Basal cell carcinoma    L clavicle, L prox forearm- removed years ago    Bladder cancer ([place])    Bladder neck contracture    CAD (coronary artery disease)    a. 12/2020 NSTEMI/Cath: LM nl, LAD 95ost/p, LCX 50p, RCA nl. EF 45-50%.   Cataract    CKD (chronic kidney disease), stage III [facility])    ED (erectile dysfunction)    Frequency    GERD (gastroesophageal reflux disease)    History of pulmonary embolus (PE) 11/2018   a. Following LE DVT-->Chronic warfarin.   Hyperlipidemia LDL goal <70    Hypertension    Hypothyroidism    Incontinence of urine    sui, s/p cryoablation   Ischemic cardiomyopathy    a. 11/2018 Echo: EF 60-65%; b. 12/2020 LV Gram: EF 45-50% in setting of NSTEMI.   Kidney stones    Neuropathy    feet   Nocturia    Prostate cancer ([place])    S/P   CRYOABLATION   Right elbow tendinitis    Right Lower Extremity DVT (deep venous thrombosis) ([place]) 11/25/2018   Vertigo    1-2x/yr   Wears glasses    Past Surgical History:  Procedure Laterality Date   AMPUTATION Left 03/16/2022   Procedure: AMPUTATION BELOW KNEE;  Surgeon: Katha Cabal, MD;  Location: [facility] ORS;  Service: Vascular;  Laterality: Left;   ARTHROPLASTY AND TENDON REPAIR LEFT THUMB  JAN 2014   CATARACT EXTRACTION W/PHACO Left 11/16/2020   Procedure: CATARACT EXTRACTION PHACO AND INTRAOCULAR LENS PLACEMENT (IOC) LEFT   7.09 00:56.4 12.6%;  Surgeon: Leandrew Koyanagi, MD;  Location: [place];  Service: Ophthalmology;  Laterality: Left;   CATARACT EXTRACTION W/PHACO Right 12/02/2020   Procedure: CATARACT EXTRACTION PHACO AND INTRAOCULAR LENS PLACEMENT (IOC) RIGHT MALYUGIN 5.52 00:49.7 11.1%;  Surgeon: Leandrew Koyanagi, MD;  Location: [place];  Service: Ophthalmology;  Laterality: Right;   COLONOSCOPY     COLONOSCOPY WITH PROPOFOL N/A 08/09/2017   Procedure: COLONOSCOPY WITH PROPOFOL;  Surgeon: Manya Silvas, MD;  Location[facility];  Service: Endoscopy;  Laterality: N/A;   CORONARY ANGIOGRAPHY N/A 12/28/2020   Procedure: CORONARY ANGIOGRAPHY;  Surgeon: Sherren Mocha, MD;  Location: [place] CV LAB;  Service: Cardiovascular;  Laterality: N/A;   CORONARY STENT INTERVENTION N/A 12/28/2020   Procedure: CORONARY STENT INTERVENTION;  Surgeon: Sherren Mocha, MD;  Location: [place] CV LAB;  Service: Cardiovascular;  Laterality: N/A;   ESOPHAGOGASTRODUODENOSCOPY (EGD) WITH PROPOFOL N/A 08/09/2017   Procedure: ESOPHAGOGASTRODUODENOSCOPY (EGD) WITH PROPOFOL;  Surgeon: Manya Silvas, MD;  Location: [facility] ENDOSCOPY;  Service: Endoscopy;  Laterality: N/A;   GREEN LIGHT LASER TURP (TRANSURETHRAL RESECTION OF PROSTATE N/A 12/14/2015   Procedure: GREEN LIGHT LASER TURP (TRANSURETHRAL RESECTION OF PROSTATE)  LASER OF BLADDER NECK CONTRACTURE;  Surgeon: Carolan Clines, MD;  Location: [facility];  Service: Urology;  Laterality: N/A;   IVC FILTER INSERTION N/A 03/19/2022   Procedure: IVC FILTER INSERTION;  Surgeon: Serafina Mitchell, MD;  Location: [place] CV LAB;  Service: Cardiovascular;  Laterality: N/A;   LEFT HEART CATH AND CORONARY ANGIOGRAPHY N/A 12/24/2020   Procedure: LEFT HEART CATH AND CORONARY ANGIOGRAPHY and possible PCI and stent;  Surgeon: Yolonda Kida, MD;  Location: [place] CV LAB;  Service: Cardiovascular;  Laterality: N/A;   LEFT HEART  CATH AND CORONARY ANGIOGRAPHY N/A 01/06/2021   Procedure: LEFT HEART CATH AND CORONARY ANGIOGRAPHY;  Surgeon: Troy Sine, MD;  Location: [place] [facility] LAB;  Service: Cardiovascular;  Laterality: N/A;   LOWER EXTREMITY ANGIOGRAPHY Left 03/11/2022   Procedure: Lower Extremity Angiography;  Surgeon: Katha Cabal, MD;  Location: [place] CV LAB;  Service: Cardiovascular;  Laterality: Left;   PENILE PROSTHESIS IMPLANT N/A 12/06/2013   Procedure: PENILE PROTHESIS INFLATABLE;  Surgeon: Ailene Rud, MD;  Location: [facility];  Service: Urology;  Laterality: N/A;   PROSTATE CRYOABLATION  06-01-2012  [facility]   PULMONARY THROMBECTOMY N/A 03/18/2022   Procedure: PULMONARY THROMBECTOMY;  Surgeon: Algernon Huxley, MD;  Location: [place] CV LAB;  Service: Cardiovascular;  Laterality: N/A;   PULMONARY THROMBECTOMY Bilateral 03/23/2022   Procedure: PULMONARY THROMBECTOMY;  Surgeon: Katha Cabal, MD;  Location: [place] CV LAB;  Service: Cardiovascular;  Laterality: Bilateral;   THROMBECTOMY ILIAC ARTERY Left 03/12/2022   Procedure: THROMBECTOMY LEG;  Surgeon: Conrad [place], MD;  Location: [facility] ORS;  Service: Vascular;  Laterality: Left;   TRANSURETHRAL RESECTION OF BLADDER NECK N/A 03/20/2015   Procedure: RELEASE BLADDER NECK CONTRACTURE  WITH WOLF BUTTON ELECTRODE ;  Surgeon: Carolan Clines, MD;  Location: [place];  Service: Urology;  Laterality: N/A;   TRANSURETHRAL RESECTION OF BLADDER TUMOR N/A 12/05/2018   Procedure: TRANSURETHRAL RESECTION OF BLADDER TUMOR (TURBT)/CYSTOSCOPY/  INSTILLATION OF Rodell Perna;  Surgeon: Ceasar Mons, MD;  Location: [facility];  Service: Urology;  Laterality: N/A;   TRANSURETHRAL RESECTION OF BLADDER TUMOR N/A 01/15/2020   Procedure: TRANSURETHRAL RESECTION OF BLADDER TUMOR (TURBT)/ CYSTOSCOPY;  Surgeon: Ceasar Mons, MD;  Location: [facility];  Service:  Urology;  Laterality: N/A;   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     Patient Active Problem List   Diagnosis Date Noted   Iron deficiency anemia 06/20/2022   Myeloproliferative disorder ([place]) 05/19/2022   History of bladder cancer 05/19/2022   Goals of care, counseling/discussion 03/23/2022   Reactive thrombocytosis 03/21/2022   Paroxysmal atrial fibrillation with RVR ([place]) 03/20/2022   Acute bilateral deep vein thrombosis (DVT) of femoral veins (HCC) 03/20/2022   Acute respiratory failure with hypoxia (HCC) 03/16/2022   Acute on chronic diastolic CHF (congestive heart failure) ([place]) 03/16/2022   PSVT (paroxysmal supraventricular tachycardia) 03/16/2022   AKI (acute kidney injury) ([place]) 03/15/2022   Critical limb ischemia of left lower extremity with ulceration of lower leg ([place]) 03/15/2022   Arterial occlusion 03/11/2022   Elevated troponin    Unstable angina ([place]) 01/05/2021   Hyperlipidemia LDL goal <70    CKD (chronic kidney disease), stage III (HCC)    Coronary artery disease    NSTEMI (non-ST elevated myocardial infarction) ([place]) 12/23/2020   Acquired hypothyroidism 04/20/2020   Chemotherapy-induced neuropathy ([place]) 04/20/2020   Nonthrombocytopenic purpura ([place])  12/03/2019   Hypercoagulable state ([place]) 12/10/2018   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 pylori) infection 09/04/2017   Medicare annual wellness visit, initial 03/31/2017   History of prostate cancer 10/27/2014   Lower urinary tract symptoms (LUTS) 03/18/2012   Nephrolithiasis 03/18/2012    PCP:  Rusty Aus, MD  REFERRING PROVIDER: Kris Hartmann, NP  REFERRING DIAG: Left below knee amputee  THERAPY DIAG:  Gait difficulty  Muscle weakness (generalized)  Left below-knee amputee [facility])  Rationale for Evaluation and Treatment Rehabilitation  ONSET DATE: 03/16/22  SUBJECTIVE:   SUBJECTIVE STATEMENT:   EVALUATION Pt. S/p R BKA after blood clots in lower leg.  Pt. Reports minimum phantom limb symptoms and occasional muscle spasms.  Pt. Takes Baclofen to manage symptoms.  Pt. Received prosthetic leg on Wednesday from [facility].  Pt. Has h/o low back pain and has received spinal epidurals in past (2 years ago).    PERTINENT HISTORY: 1. Hx of BKA, left [facility]) Mr. Sanjiv Petit was seen for further evaluation for left below knee prosthesis.  The is a 77 year old male who had amputation on 03/16/2022.  At the time he is well healed and ready for fitting of new below knee prosthesis.  He is a highly motivated individual and should do well once fitted with prosthesis.  She has no problem returning to a K2 ambulator, which will allow him to walk inside and outside of his home and overload level barriers.    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  He will be having a bone marrow biopsy.  We will continue to have the patient keep his IVC filter for now as there may be further test.  We will have the patient return in 3 months to discuss filter removal.   PAIN:  Are you having pain? No  PRECAUTIONS: None  WEIGHT BEARING RESTRICTIONS No  FALLS:  Has patient fallen in last 6 months? No  LIVING ENVIRONMENT: Lives with: lives with their spouse Lives in: House/apartment Stairs: Yes: Internal: 13+ steps; on right going up Has following equipment at home: Single point cane and Walker - 2 wheeled  OCCUPATION: Retired  PLOF: [place] with walking and return to playing golf by spring.      OBJECTIVE:   PATIENT SURVEYS:  FOTO initial 40/ goal 69.    COGNITION:  Overall cognitive status: Within functional limits for tasks assessed     SENSATION: WFL  EDEMA:  Circumferential: L/R knee joint line (34/36 cm.), calf (30.5/ 33 cm), distal quad (35/36 cm).    POSTURE: rounded shoulders and forward head  PALPATION: No tenderness/ good incision healing on residual limb  LOWER EXTREMITY ROM:  B LE WFL (all planes).  L knee 0-130 deg.   LOWER EXTREMITY MMT:  R LE muscle strength grossly 5/5 MMT except hip flexion 4+/5 MMT.  L LE strength grossly 5/5 MMT except hip flexion 4/5 MMT and hip abduction 4+/5 MMT.    FUNCTIONAL TESTS:  5 times sit to stand: 14.1 sec. And requires R UE assist.  Able  to stand from chair with addition of 2" Airex pad.    GAIT: Distance walked: in clinic/ //-bars Assistive device utilized: Environmental consultant - 2 wheeled Level of assistance: Min A Comments: Ambulates in clinic with RW and min. A/ cuing for proper technique and step pattern.  Pt. Able to ambulate in //-bars with R UE assist only and 2-point gait pattern.    11/27:  5xSTS: 13.04 sec. (No UE assist).  1/11: 5xSTS: 9.47 sec./ 8.19 sec.  R LE muscle strength grossly 5/5 MMT except hip flexion 4+/5 MMT.  L LE strength grossly 5/5 MMT except hip flexion 4/5 MMT and hip abduction 4+/5 MMT.  No significant changes to hip flexor strength noted.    TODAY'S TREATMENT:  10/13/22:  Subjective:  Pt. Reports no pain and no new complaints. Pt. States he has been attending the gym about 3x per week. Pt. Has been gradually increasing his wt. During strength training with no pain.   Neuro:  Scifit L5 6 min. B LE only (consistent cadence).  Warm-up   Walking in //-bars: CGA and //-bars for UE support for occasional LOB   Mix of 12" and 6" Hurdles. Forward 5x laps   6" hurdles lateral stepping 3x10 reps   Plenth stepovers with 2 rows frwd/bkwrd stepping  2x15 reps (1 rep= frwd/bkwd)    Dynamic  movements in agility ladder to increase functional dynamic movements. Pt. Given different patterns to complete throughout agility ladder. Pt. Performed both frwd./bkwd. movements with minimal LOB . Gait belt and CGA for safety. 8x laps of differentiating dynamic patterns for Pt. To complete.   PATIENT EDUCATION:  Education details: Reviewed HEP Person educated: Patient Education method: Explanation, Demonstration, and Handouts Education comprehension: verbalized understanding and returned demonstration   HOME EXERCISE PROGRAM: See handouts.   ASSESSMENT:  CLINICAL IMPRESSION: Patient is highly motivated and hard working during standing/ walking tasks today. Pt. Completed more advanced agility/ balance exercises for the first time with very few instances of LOB. PT applied gait belt for safety and pt. Used //-bars for UE correction for LOB. Pt. Reported no pain during the treatment and required minimal rest breaks. Pt. Is continuing to advance his gait and functional mobility in a pain free range. Pt. will benefit from skilled PT services to increase LE muscle strength to improve independence with walking/ return to playing golf.      OBJECTIVE IMPAIRMENTS Abnormal gait, decreased activity tolerance, decreased balance, decreased endurance, decreased mobility, difficulty walking, decreased strength, impaired flexibility, improper body mechanics, postural dysfunction, and pain.   ACTIVITY LIMITATIONS carrying, lifting, bending, standing, squatting, stairs, transfers, toileting, dressing, and locomotion level  PARTICIPATION LIMITATIONS: driving, community activity, and yard work  PERSONAL FACTORS Fitness and Past/current experiences are also affecting patient's functional outcome.   REHAB POTENTIAL: Good  CLINICAL DECISION MAKING: Stable/uncomplicated  EVALUATION COMPLEXITY: Low   GOALS: Goals reviewed with patient? Yes  SHORT TERM GOALS: Target date: 09/27/22 Pt. Independent with HEP  to increase B hip strength 1/2 muscle grade to improve standing/walking mod. Independence.   Baseline:  see above.  L hip flexion 4/5 MMT.  Goal status: Goal met    LONG TERM GOALS: Target date: 10/25/22  Pt. Will increase FOTO to 59 to improve functional mobility.   Baseline:  initial FOTO 40.  11/8: 68 Goal status: Goal met  2.  Pt. Able to ascend/descend 13 stairs with R handrail and consistent step pattern to improve pts. Ability to go to 2nd floor of home.  11/8:  recip. Gait on stairs outside with R handrail for safety.   Baseline:  Goal status: Goal met  3.  Pt. Will ambulate with mod. I and SPC with consistent 2-point gait pattern and no loss of balance to improve independence at home/ community.   Baseline:  11/8: pt. Ambulates community distances Goal status: Goal met  4.  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    Baseline:  pt. Requires extra time/ SPC while walking outside and limited with carrying  5.  Pt. Able to return to hitting golf balls/ playing round of golf with no limitations safely.    Baseline:  pt. Demonstrates chipping golf balls in gym with SBA/CGA    PLAN: PT FREQUENCY: 2x/week  PT DURATION: 8 weeks  PLANNED INTERVENTIONS: Therapeutic exercises, Therapeutic activity, Neuromuscular re-education, Balance training, Gait training, Patient/Family education, Self Care, Joint mobilization, Stair training, Prosthetic training, Manual therapy, and Re-evaluation  PLAN FOR NEXT SESSION: Progress gait to more skilled dynamic tasks.  Discuss golfing.    Delesia Martinek B. Rogers Blocker, SPT Pura Spice, PT, DPT # [phone] 10/13/2022, 3:56 PM

## 2022-10-17 DIAGNOSIS — N5237 Erectile dysfunction following prostate ablative therapy: Secondary | ICD-10-CM | POA: Diagnosis not present

## 2022-10-17 DIAGNOSIS — Z8551 Personal history of malignant neoplasm of bladder: Secondary | ICD-10-CM | POA: Diagnosis not present

## 2022-10-17 DIAGNOSIS — R3915 Urgency of urination: Secondary | ICD-10-CM | POA: Diagnosis not present

## 2022-10-17 DIAGNOSIS — Z8546 Personal history of malignant neoplasm of prostate: Secondary | ICD-10-CM | POA: Diagnosis not present

## 2022-10-18 ENCOUNTER — Ambulatory Visit: Payer: HMO | Admitting: Physical Therapy

## 2022-10-18 DIAGNOSIS — R269 Unspecified abnormalities of gait and mobility: Secondary | ICD-10-CM | POA: Diagnosis not present

## 2022-10-18 DIAGNOSIS — M6281 Muscle weakness (generalized): Secondary | ICD-10-CM

## 2022-10-18 DIAGNOSIS — Z89512 Acquired absence of left leg below knee: Secondary | ICD-10-CM

## 2022-10-18 NOTE — Therapy (Signed)
OUTPATIENT PHYSICAL THERAPY LOWER EXTREMITY TREATMENT  Patient Name: ESTEL SCHOLZE MRN: [id number] DOB:05-Apr-1946, 77 y.o., male Today's Date: 10/18/2022    PT End of Session - 10/18/22 0825     Visit Number 24    Number of Visits 30    Date for PT Re-Evaluation 10/25/22    PT Start Time 0820    PT Stop Time 0906    PT Time Calculation (min) 46 min    Equipment Utilized During Treatment Gait belt    Activity Tolerance Patient tolerated treatment well    Behavior During Therapy WFL for tasks assessed/performed             Past Medical History:  Diagnosis Date   Arthritis    Basal cell carcinoma    L clavicle, L prox forearm- removed years ago    Bladder cancer ([place])    Bladder neck contracture    CAD (coronary artery disease)    a. 12/2020 NSTEMI/Cath: LM nl, LAD 95ost/p, LCX 50p, RCA nl. EF 45-50%.   Cataract    CKD (chronic kidney disease), stage III [facility])    ED (erectile dysfunction)    Frequency    GERD (gastroesophageal reflux disease)    History of pulmonary embolus (PE) 11/2018   a. Following LE DVT-->Chronic warfarin.   Hyperlipidemia LDL goal <70    Hypertension    Hypothyroidism    Incontinence of urine    sui, s/p cryoablation   Ischemic cardiomyopathy    a. 11/2018 Echo: EF 60-65%; b. 12/2020 LV Gram: EF 45-50% in setting of NSTEMI.   Kidney stones    Neuropathy    feet   Nocturia    Prostate cancer ([place])    S/P   CRYOABLATION   Right elbow tendinitis    Right Lower Extremity DVT (deep venous thrombosis) ([place]) 11/25/2018   Vertigo    1-2x/yr   Wears glasses    Past Surgical History:  Procedure Laterality Date   AMPUTATION Left 03/16/2022   Procedure: AMPUTATION BELOW KNEE;  Surgeon: Katha Cabal, MD;  Location: [facility] ORS;  Service: Vascular;  Laterality: Left;   ARTHROPLASTY AND TENDON REPAIR LEFT THUMB  JAN 2014   CATARACT EXTRACTION W/PHACO Left 11/16/2020   Procedure: CATARACT EXTRACTION PHACO AND INTRAOCULAR LENS PLACEMENT (IOC) LEFT   7.09 00:56.4 12.6%;  Surgeon: Leandrew Koyanagi, MD;  Location: [place];  Service: Ophthalmology;  Laterality: Left;   CATARACT EXTRACTION W/PHACO Right 12/02/2020   Procedure: CATARACT EXTRACTION PHACO AND INTRAOCULAR LENS PLACEMENT (IOC) RIGHT MALYUGIN 5.52 00:49.7 11.1%;  Surgeon: Leandrew Koyanagi, MD;  Location: [place];  Service: Ophthalmology;  Laterality: Right;   COLONOSCOPY     COLONOSCOPY WITH PROPOFOL N/A 08/09/2017   Procedure: COLONOSCOPY WITH PROPOFOL;  Surgeon: Manya Silvas, MD;  Location[facility] ENDOSCOPY;  Service: Endoscopy;  Laterality: N/A;   CORONARY ANGIOGRAPHY N/A 12/28/2020   Procedure: CORONARY ANGIOGRAPHY;  Surgeon: Sherren Mocha, MD;  Location: [place] CV LAB;  Service: Cardiovascular;  Laterality: N/A;   CORONARY STENT INTERVENTION N/A 12/28/2020   Procedure: CORONARY STENT INTERVENTION;  Surgeon: Sherren Mocha, MD;  Location: [place] CV LAB;  Service: Cardiovascular;  Laterality: N/A;   ESOPHAGOGASTRODUODENOSCOPY (EGD) WITH PROPOFOL N/A 08/09/2017   Procedure: ESOPHAGOGASTRODUODENOSCOPY (EGD) WITH PROPOFOL;  Surgeon: Manya Silvas, MD;  Location: [facility] ENDOSCOPY;  Service: Endoscopy;  Laterality: N/A;   GREEN LIGHT LASER TURP (TRANSURETHRAL RESECTION OF PROSTATE N/A 12/14/2015   Procedure: GREEN LIGHT LASER TURP (TRANSURETHRAL RESECTION OF PROSTATE)  LASER OF BLADDER NECK CONTRACTURE;  Surgeon: Carolan Clines, MD;  Location: [facility];  Service: Urology;  Laterality: N/A;   IVC FILTER INSERTION N/A 03/19/2022   Procedure: IVC FILTER INSERTION;  Surgeon: Serafina Mitchell, MD;  Location: [place] CV LAB;  Service: Cardiovascular;  Laterality: N/A;   LEFT HEART CATH AND CORONARY ANGIOGRAPHY N/A 12/24/2020   Procedure: LEFT HEART CATH AND CORONARY ANGIOGRAPHY and possible PCI and stent;  Surgeon: Yolonda Kida, MD;  Location: [place] CV LAB;  Service: Cardiovascular;  Laterality: N/A;   LEFT HEART  CATH AND CORONARY ANGIOGRAPHY N/A 01/06/2021   Procedure: LEFT HEART CATH AND CORONARY ANGIOGRAPHY;  Surgeon: [place] Sine, MD;  Location: [place] CV LAB;  Service: Cardiovascular;  Laterality: N/A;   LOWER EXTREMITY ANGIOGRAPHY Left 03/11/2022   Procedure: Lower Extremity Angiography;  Surgeon: Katha Cabal, MD;  Location: [place] CV LAB;  Service: Cardiovascular;  Laterality: Left;   PENILE PROSTHESIS IMPLANT N/A 12/06/2013   Procedure: PENILE PROTHESIS INFLATABLE;  Surgeon: Ailene Rud, MD;  Location: [facility];  Service: Urology;  Laterality: N/A;   PROSTATE CRYOABLATION  06-01-2012  [facility]   PULMONARY THROMBECTOMY N/A 03/18/2022   Procedure: PULMONARY THROMBECTOMY;  Surgeon: Algernon Huxley, MD;  Location: [place] CV LAB;  Service: Cardiovascular;  Laterality: N/A;   PULMONARY THROMBECTOMY Bilateral 03/23/2022   Procedure: PULMONARY THROMBECTOMY;  Surgeon: Katha Cabal, MD;  Location: [place] CV LAB;  Service: Cardiovascular;  Laterality: Bilateral;   THROMBECTOMY ILIAC ARTERY Left 03/12/2022   Procedure: THROMBECTOMY LEG;  Surgeon: [place], MD;  Location: [facility];  Service: Vascular;  Laterality: Left;   TRANSURETHRAL RESECTION OF BLADDER NECK N/A 03/20/2015   Procedure: RELEASE BLADDER NECK CONTRACTURE  WITH WOLF BUTTON ELECTRODE ;  Surgeon: Carolan Clines, MD;  Location: [place];  Service: Urology;  Laterality: N/A;   TRANSURETHRAL RESECTION OF BLADDER TUMOR N/A 12/05/2018   Procedure: TRANSURETHRAL RESECTION OF BLADDER TUMOR (TURBT)/CYSTOSCOPY/  INSTILLATION OF Rodell Perna;  Surgeon: Ceasar Mons, MD;  Location: [facility];  Service: Urology;  Laterality: N/A;   TRANSURETHRAL RESECTION OF BLADDER TUMOR N/A 01/15/2020   Procedure: TRANSURETHRAL RESECTION OF BLADDER TUMOR (TURBT)/ CYSTOSCOPY;  Surgeon: Ceasar Mons, MD;  Location: [facility];  Service:  Urology;  Laterality: N/A;   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     Patient Active Problem List   Diagnosis Date Noted   Iron deficiency anemia 06/20/2022   Myeloproliferative disorder ([place]) 05/19/2022   History of bladder cancer 05/19/2022   Goals of care, counseling/discussion 03/23/2022   Reactive thrombocytosis 03/21/2022   Paroxysmal atrial fibrillation with RVR ([place]) 03/20/2022   Acute bilateral deep vein thrombosis (DVT) of femoral veins (HCC) 03/20/2022   Acute respiratory failure with hypoxia (HCC) 03/16/2022   Acute on chronic diastolic CHF (congestive heart failure) ([place]) 03/16/2022   PSVT (paroxysmal supraventricular tachycardia) 03/16/2022   AKI (acute kidney injury) ([place]) 03/15/2022   Critical limb ischemia of left lower extremity with ulceration of lower leg ([place]) 03/15/2022   Arterial occlusion 03/11/2022   Elevated troponin    Unstable angina ([place]) 01/05/2021   Hyperlipidemia LDL goal <70    CKD (chronic kidney disease), stage III (HCC)    Coronary artery disease    NSTEMI (non-ST elevated myocardial infarction) ([place]) 12/23/2020   Acquired hypothyroidism 04/20/2020   Chemotherapy-induced neuropathy ([place]) 04/20/2020   Nonthrombocytopenic purpura ([place])  12/03/2019   Hypercoagulable state ([place]) 12/10/2018   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 pylori) infection 09/04/2017   Medicare annual wellness visit, initial 03/31/2017   History of prostate cancer 10/27/2014   Lower urinary tract symptoms (LUTS) 03/18/2012   Nephrolithiasis 03/18/2012    PCP:  Rusty Aus, MD  REFERRING PROVIDER: Kris Hartmann, NP  REFERRING DIAG: Left below knee amputee  THERAPY DIAG:  Gait difficulty  Muscle weakness (generalized)  Left below-knee amputee [facility])  Rationale for Evaluation and Treatment Rehabilitation  ONSET DATE: 03/16/22  SUBJECTIVE:   SUBJECTIVE STATEMENT:   EVALUATION Pt. S/p R BKA after blood clots in lower leg.  Pt. Reports minimum phantom limb symptoms and occasional muscle spasms.  Pt. Takes Baclofen to manage symptoms.  Pt. Received prosthetic leg on Wednesday from [facility].  Pt. Has h/o low back pain and has received spinal epidurals in past (2 years ago).    PERTINENT HISTORY: 1. Hx of BKA, left [facility]) Mr. Sanjiv Petit was seen for further evaluation for left below knee prosthesis.  The is a 77 year old male who had amputation on 03/16/2022.  At the time he is well healed and ready for fitting of new below knee prosthesis.  He is a highly motivated individual and should do well once fitted with prosthesis.  She has no problem returning to a K2 ambulator, which will allow him to walk inside and outside of his home and overload level barriers.    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  He will be having a bone marrow biopsy.  We will continue to have the patient keep his IVC filter for now as there may be further test.  We will have the patient return in 3 months to discuss filter removal.   PAIN:  Are you having pain? No  PRECAUTIONS: None  WEIGHT BEARING RESTRICTIONS No  FALLS:  Has patient fallen in last 6 months? No  LIVING ENVIRONMENT: Lives with: lives with their spouse Lives in: House/apartment Stairs: Yes: Internal: 13+ steps; on right going up Has following equipment at home: Single point cane and Walker - 2 wheeled  OCCUPATION: Retired  PLOF: [place] with walking and return to playing golf by spring.      OBJECTIVE:   PATIENT SURVEYS:  FOTO initial 40/ goal 69.    COGNITION:  Overall cognitive status: Within functional limits for tasks assessed     SENSATION: WFL  EDEMA:  Circumferential: L/R knee joint line (34/36 cm.), calf (30.5/ 33 cm), distal quad (35/36 cm).    POSTURE: rounded shoulders and forward head  PALPATION: No tenderness/ good incision healing on residual limb  LOWER EXTREMITY ROM:  B LE WFL (all planes).  L knee 0-130 deg.   LOWER EXTREMITY MMT:  R LE muscle strength grossly 5/5 MMT except hip flexion 4+/5 MMT.  L LE strength grossly 5/5 MMT except hip flexion 4/5 MMT and hip abduction 4+/5 MMT.    FUNCTIONAL TESTS:  5 times sit to stand: 14.1 sec. And requires R UE assist.  Able  to stand from chair with addition of 2" Airex pad.    GAIT: Distance walked: in clinic/ //-bars Assistive device utilized: Environmental consultant - 2 wheeled Level of assistance: Min A Comments: Ambulates in clinic with RW and min. A/ cuing for proper technique and step pattern.  Pt. Able to ambulate in //-bars with R UE assist only and 2-point gait pattern.    11/27:  5xSTS: 13.04 sec. (No UE assist).  1/11: 5xSTS: 9.47 sec./ 8.19 sec.  R LE muscle strength grossly 5/5 MMT except hip flexion 4+/5 MMT.  L LE strength grossly 5/5 MMT except hip flexion 4/5 MMT and hip abduction 4+/5 MMT.  No significant changes to hip flexor strength noted.    TODAY'S TREATMENT:  10/18/22:  Subjective:  Pt. Reports no pain and no new complaints. Pt. Attempted golfing at the driving range with no pain but had difficulty with balance/technique. Pt. States he has been attending the gym about 3x per week. Pt. Has been gradually increasing his wt. During strength training with no pain. Pt. States he feels stronger.   Neuro: Walking in //-bars: CGA and //-bars for UE support incase of occasional LOB  -Lateral stepping (3 laps) with no UE support needed.   -High marches with knee to contralateral hand touches.  Pt. had few instances of LOB. (3 laps)  -Mix of 12" and 6" Hurdles. Forward 3x laps. Pt. Had difficulty with 12" hurdle and reported tightness on posterior of prosthetic.    -Lateral step up/overs with two 6" steps side by side. 2x15 reps. Occasional LOB with gait belt correction.    -Dynamic movements in agility ladder to increase functional dynamic movements. Pt. Given different patterns to complete throughout agility ladder. Pt. Performed both frwd./bkwd. movements with minimal LOB . Gait belt and CGA for safety. 4x laps of differentiating dynamic patterns for Pt. To complete.   PATIENT EDUCATION:  Education details: Reviewed HEP Person educated: Patient Education method: Explanation, Demonstration, and Handouts Education comprehension: verbalized understanding and returned demonstration   HOME EXERCISE PROGRAM: See handouts.   ASSESSMENT:  CLINICAL IMPRESSION: Patient is highly motivated and hard working during standing/ walking tasks today. PT noticed a light favor of the uninvolved limb over bearing full weight in his prosthetic limb. Pt. Is continuing to advance his dynamic stability. Pt. Has good prognosis to continue progressing to his hobbies/goals of returning to light golfing. Pt. Has difficulty with tasks involving turning and other stability tasks. Pt. Requires cuing to maintain correct posture when performing activities to avoid swaying of the hips and compensation.  Increase residual limb discomfort with gait after prolonged sitting at end of tx. Session.   Pt. will benefit from skilled PT services to increase LE muscle strength to improve independence with walking/ return to playing golf.      OBJECTIVE IMPAIRMENTS Abnormal gait, decreased activity tolerance, decreased balance, decreased endurance, decreased mobility, difficulty walking, decreased strength, impaired flexibility, improper body mechanics, postural dysfunction, and pain.   ACTIVITY LIMITATIONS carrying, lifting,  bending, standing, squatting, stairs, transfers, toileting, dressing, and locomotion level  PARTICIPATION LIMITATIONS: driving, community activity, and yard work  PERSONAL FACTORS Fitness and Past/current experiences are also affecting patient's functional outcome.   REHAB POTENTIAL: Good  CLINICAL DECISION MAKING: Stable/uncomplicated  EVALUATION COMPLEXITY: Low   GOALS: Goals reviewed with patient? Yes  SHORT TERM GOALS: Target date: 09/27/22 Pt. Independent with HEP to increase B hip strength 1/2 muscle grade to improve standing/walking mod. Independence.   Baseline:  see above.  L  hip flexion 4/5 MMT.  Goal status: Goal met    LONG TERM GOALS: Target date: 10/25/22  Pt. Will increase FOTO to 59 to improve functional mobility.   Baseline:  initial FOTO 40.  11/8: 68 Goal status: Goal met  2.  Pt. Able to ascend/descend 13 stairs with R handrail and consistent step pattern to improve pts. Ability to go to 2nd floor of home.  11/8: recip. Gait on stairs outside with R handrail for safety.   Baseline:  Goal status: Goal met  3.  Pt. Will ambulate with mod. I and SPC with consistent 2-point gait pattern and no loss of balance to improve independence at home/ community.   Baseline:  11/8: pt. Ambulates community distances Goal status: Goal met  4.  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    Baseline:  pt. Requires extra time/ SPC while walking outside and limited with carrying  5.  Pt. Able to return to hitting golf balls/ playing round of golf with no limitations safely.    Baseline:  pt. Demonstrates chipping golf balls in gym with SBA/CGA    PLAN: PT FREQUENCY: 2x/week  PT DURATION: 8 weeks  PLANNED INTERVENTIONS: Therapeutic exercises, Therapeutic activity, Neuromuscular re-education, Balance training, Gait training, Patient/Family education, Self Care, Joint mobilization, Stair training, Prosthetic training, Manual  therapy, and Re-evaluation  PLAN FOR NEXT SESSION: Progress gait to more skilled dynamic tasks.  Simulate golfing.    Lynzee Lindquist B. Rogers Blocker, SPT Pura Spice, PT, DPT # [phone] 10/18/2022, 12:50 PM

## 2022-10-20 ENCOUNTER — Ambulatory Visit: Payer: HMO | Attending: Nurse Practitioner | Admitting: Physical Therapy

## 2022-10-20 ENCOUNTER — Other Ambulatory Visit: Payer: Self-pay | Admitting: Oncology

## 2022-10-20 ENCOUNTER — Encounter: Payer: Self-pay | Admitting: Oncology

## 2022-10-20 ENCOUNTER — Inpatient Hospital Stay: Payer: HMO | Attending: Oncology

## 2022-10-20 ENCOUNTER — Inpatient Hospital Stay (HOSPITAL_BASED_OUTPATIENT_CLINIC_OR_DEPARTMENT_OTHER): Payer: HMO | Admitting: Oncology

## 2022-10-20 VITALS — BP 131/74 | HR 67 | Temp 98.9°F | Resp 19 | Wt 168.8 lb

## 2022-10-20 DIAGNOSIS — Z8546 Personal history of malignant neoplasm of prostate: Secondary | ICD-10-CM | POA: Insufficient documentation

## 2022-10-20 DIAGNOSIS — D471 Chronic myeloproliferative disease: Secondary | ICD-10-CM

## 2022-10-20 DIAGNOSIS — Z8551 Personal history of malignant neoplasm of bladder: Secondary | ICD-10-CM | POA: Insufficient documentation

## 2022-10-20 DIAGNOSIS — I82409 Acute embolism and thrombosis of unspecified deep veins of unspecified lower extremity: Secondary | ICD-10-CM | POA: Diagnosis not present

## 2022-10-20 DIAGNOSIS — M6281 Muscle weakness (generalized): Secondary | ICD-10-CM

## 2022-10-20 DIAGNOSIS — Z7901 Long term (current) use of anticoagulants: Secondary | ICD-10-CM | POA: Diagnosis not present

## 2022-10-20 DIAGNOSIS — D508 Other iron deficiency anemias: Secondary | ICD-10-CM

## 2022-10-20 DIAGNOSIS — Z86718 Personal history of other venous thrombosis and embolism: Secondary | ICD-10-CM | POA: Diagnosis not present

## 2022-10-20 DIAGNOSIS — Z89512 Acquired absence of left leg below knee: Secondary | ICD-10-CM

## 2022-10-20 DIAGNOSIS — D509 Iron deficiency anemia, unspecified: Secondary | ICD-10-CM | POA: Diagnosis not present

## 2022-10-20 DIAGNOSIS — R269 Unspecified abnormalities of gait and mobility: Secondary | ICD-10-CM | POA: Diagnosis not present

## 2022-10-20 DIAGNOSIS — C674 Malignant neoplasm of posterior wall of bladder: Secondary | ICD-10-CM | POA: Diagnosis not present

## 2022-10-20 DIAGNOSIS — Z86711 Personal history of pulmonary embolism: Secondary | ICD-10-CM | POA: Diagnosis present

## 2022-10-20 LAB — CBC WITH DIFFERENTIAL/PLATELET
Abs Immature Granulocytes: 0.17 10*3/uL — ABNORMAL HIGH (ref 0.00–0.07)
Basophils Absolute: 0.1 10*3/uL (ref 0.0–0.1)
Basophils Relative: 1 %
Eosinophils Absolute: 0.5 10*3/uL (ref 0.0–0.5)
Eosinophils Relative: 4 %
HCT: 48.8 % (ref 39.0–52.0)
Hemoglobin: 15.7 g/dL (ref 13.0–17.0)
Immature Granulocytes: 1 %
Lymphocytes Relative: 12 %
Lymphs Abs: 1.5 10*3/uL (ref 0.7–4.0)
MCH: 31.7 pg (ref 26.0–34.0)
MCHC: 32.2 g/dL (ref 30.0–36.0)
MCV: 98.6 fL (ref 80.0–100.0)
Monocytes Absolute: 1 10*3/uL (ref 0.1–1.0)
Monocytes Relative: 8 %
Neutro Abs: 9.6 10*3/uL — ABNORMAL HIGH (ref 1.7–7.7)
Neutrophils Relative %: 74 %
Platelets: 351 10*3/uL (ref 150–400)
RBC: 4.95 MIL/uL (ref 4.22–5.81)
RDW: 15.5 % (ref 11.5–15.5)
WBC: 12.8 10*3/uL — ABNORMAL HIGH (ref 4.0–10.5)
nRBC: 0 % (ref 0.0–0.2)

## 2022-10-20 LAB — COMPREHENSIVE METABOLIC PANEL
ALT: 20 U/L (ref 0–44)
AST: 19 U/L (ref 15–41)
Albumin: 3.8 g/dL (ref 3.5–5.0)
Alkaline Phosphatase: 60 U/L (ref 38–126)
Anion gap: 8 (ref 5–15)
BUN: 24 mg/dL — ABNORMAL HIGH (ref 8–23)
CO2: 24 mmol/L (ref 22–32)
Calcium: 8.8 mg/dL — ABNORMAL LOW (ref 8.9–10.3)
Chloride: 108 mmol/L (ref 98–111)
Creatinine, Ser: 1.26 mg/dL — ABNORMAL HIGH (ref 0.61–1.24)
GFR, Estimated: 59 mL/min — ABNORMAL LOW (ref >60)
Glucose, Bld: 135 mg/dL — ABNORMAL HIGH (ref 70–99)
Potassium: 4 mmol/L (ref 3.5–5.1)
Sodium: 140 mmol/L (ref 135–145)
Total Bilirubin: 0.6 mg/dL (ref 0.3–1.2)
Total Protein: 7.4 g/dL (ref 6.5–8.1)

## 2022-10-20 LAB — IRON AND TIBC
Iron: 68 ug/dL (ref 45–182)
Saturation Ratios: 21 % (ref 17.9–39.5)
TIBC: 328 ug/dL (ref 250–450)
UIBC: 260 ug/dL

## 2022-10-20 LAB — RETIC PANEL
Immature Retic Fract: 12.7 % (ref 2.3–15.9)
RBC.: 4.94 MIL/uL (ref 4.22–5.81)
Retic Count, Absolute: 99.3 10*3/uL (ref 19.0–186.0)
Retic Ct Pct: 2 % (ref 0.4–3.1)
Reticulocyte Hemoglobin: 35.7 pg (ref >27.9)

## 2022-10-20 LAB — FERRITIN: Ferritin: 17 ng/mL — ABNORMAL LOW (ref 24–336)

## 2022-10-20 MED ORDER — HYDROXYUREA 500 MG PO CAPS: 500.0000 mg | ORAL_CAPSULE | Freq: Every day | ORAL | 3 refills | Status: DC

## 2022-10-20 NOTE — Assessment & Plan Note (Addendum)
Iron panel has improved, still low.  Recommend patient to continue Vitron C every other day.

## 2022-10-20 NOTE — Assessment & Plan Note (Addendum)
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 He tolerates hydroxyurea well. Platelet is wnl Continue Hydroxyurea '500mg'$  daily. Refill was sent.

## 2022-10-20 NOTE — Telephone Encounter (Signed)
CBC with Differential/Platelet Order: [id number] Status: Final result     Visible to patient: No (scheduled for 10/20/2022 11:36 AM)     Next appt: 10/25/2022 at 08:15 AM in Rehabilitation Clementeen Hoof, Phillip [name], PT)     Dx: Recurrent deep vein thrombosis (DVT) ...   0 Result Notes           Component Ref Range & Units 10:13 (10/20/22) 1 mo ago (08/31/22) 1 mo ago (08/22/22) 2 mo ago (08/01/22) 3 mo ago (07/20/22) 3 mo ago (07/04/22) 4 mo ago (06/20/22)  WBC 4.0 - 10.5 K/uL 12.8 High  6.6 8.7 8.7 7.1 CM 9.1 9.4  RBC 4.22 - 5.81 MIL/uL 4.95 4.90 4.93 4.76 5.11 4.90 4.71  Hemoglobin 13.0 - 17.0 g/dL 15.7 14.6 14.7 13.5 14.2 13.0 11.9 Low   HCT 39.0 - 52.0 % 48.8 46.5 46.3 43.4 46.8 43.5 40.2  MCV 80.0 - 100.0 fL 98.6 94.9 93.9 91.2 91.6 88.8 85.4  MCH 26.0 - 34.0 pg 31.7 29.8 29.8 28.4 27.8 26.5 25.3 Low   MCHC 30.0 - 36.0 g/dL 32.2 31.4 31.7 31.1 30.3 29.9 Low  29.6 Low   RDW 11.5 - 15.5 % 15.5 18.7 High  20.3 High  22.8 High  23.8 High  22.4 High  19.0 High   Platelets 150 - 400 K/uL 351 315 340 420 High  475 High  577 High  777 High   nRBC 0.0 - 0.2 % 0.0 0.0 0.0 0.0 0.0 0.0 0.0  Neutrophils Relative % % 74 69 65 67 66 63 69  Neutro Abs 1.7 - 7.7 K/uL 9.6 High  4.5 5.6 5.7 4.6 5.7 6.6  Lymphocytes Relative % '12 22 23 23 24 26 20  '$ Lymphs Abs 0.7 - 4.0 K/uL 1.5 1.5 2.0 2.0 1.7 2.3 1.9  Monocytes Relative % '8 6 8 7 7 7 6  '$ Monocytes Absolute 0.1 - 1.0 K/uL 1.0 0.4 0.7 0.6 0.5 0.6 0.6  Eosinophils Relative % '4 2 3 2 2 3 3  '$ Eosinophils Absolute 0.0 - 0.5 K/uL 0.5 0.2 0.2 0.2 0.2 0.3 0.2  Basophils Relative % '1 1 1 1 1 1 1  '$ Basophils Absolute 0.0 - 0.1 K/uL 0.1 0.1 0.1 0.1 0.1 0.1 0.1  Immature Granulocytes % 1 0 0 0 0 0 1  Abs Immature Granulocytes 0.00 - 0.07 K/uL 0.17 High  0.02 CM 0.02 CM 0.03 0.03 CM 0.04 CM 0.05 CM  Comment: Performed at [facility], [address] [place], [place] [address]  WBC Morphology     MORPHOLOGY UNREMARKABLE MORPHOLOGY UNREMARKABLE    RBC Morphology      MIXED RBC MORPHOLOGY MORPHOLOGY UNREMARKABLE    Smear Review     Normal platelet morphology Normal platelet morphology CM    Ovalocytes     PRESENT CM     Resulting Agency  [place] [facility] [place] [facility] [place] [facility] [place] [facility] [place] [facility] [place] [facility] [place] [facility]         Specimen Collected: 10/20/22 10:13 Last Resulted: 10/20/22 10:36      Lab Flowsheet      Order Details      View Encounter      Lab and Collection Details      Routing      Result History    View All Conversations on this Encounter      CM=Additional comments      Result Care Coordination   Patient Communication   10/20/2022  11:36 AM Release Now   Not seen Back to Top      Other Results from 10/20/2022   Contains abnormal data Ferritin Order: [id number] Status: Final result      Visible to patient: No (scheduled for 10/20/2022  1:44 PM)      Next appt: 10/25/2022 at 08:15 AM in Rehabilitation Clementeen Hoof, Phillip [name], PT)      Dx: Recurrent deep vein thrombosis (DVT) ...    0 Result Notes          Component Ref Range & Units 10:13 3 mo ago 4 mo ago 7 mo ago  Ferritin 24 - 336 ng/mL 17 Low  17 Low  CM 10 Low  CM 66 CM  Comment: Performed at [facility], [address] [place], [place] [address]  Resulting Agency  [facility] CLIN LAB [facility] CL[facility] LAB [place] CL[facility] LAB [facility] CLIN LAB         Specimen Collected: 10/20/22 10:13 Last Resulted: 10/20/22 12:44      Lab Flowsheet       Order Details       View Encounter       Lab and Collection Details       Routing       Result History     View All Conversations on this Encounter      CM=Additional comments      Result Care Coordination   Patient Communication   10/20/2022  1:44 PM Release Now   Not seen Back to Top        Iron and TIBC Order: [id number] Status: Final result      Visible to patient: No (scheduled for 10/20/2022  1:44 PM)      Next appt: 10/25/2022 at 08:15 AM in Rehabilitation Clementeen Hoof, Phillip [name], PT)      Dx: Recurrent deep  vein thrombosis (DVT) ...    0 Result Notes          Component Ref Range & Units 10:13 3 mo ago 4 mo ago 7 mo ago  Iron 45 - 182 ug/dL 68 63 39 Low  22 Low   TIBC 250 - 450 ug/dL 328 332 330 192 Low   Saturation Ratios 17.9 - 39.5 % '21 19 12 '$ Low  12 Low   UIBC ug/dL 260 269 CM 291 CM 170 CM  Comment: Performed at [facility], [address] [place], [place] [address]  Resulting Agency  [facility] CLIN LAB [place] CLIN LAB [place] CLIN LAB [facility] CLIN LAB         Specimen Collected: 10/20/22 10:13 Last Resulted: 10/20/22 12:44      Lab Flowsheet       Order Details       View Encounter       Lab and Collection Details       Routing       Result History     View All Conversations on this Encounter      CM=Additional comments      Result Care Coordination   Patient Communication   10/20/2022  1:44 PM Release Now   Not seen Back to Top         Contains abnormal data Comprehensive metabolic panel Order: [id number] Status: Final result      Visible to patient: No (scheduled for 10/20/2022 11:40 AM)      Next appt: 10/25/2022 at 08:15 AM in Rehabilitation Clementeen Hoof, Phillip [name], PT)      Dx: Recurrent deep vein  thrombosis (DVT) ...    0 Result Notes      1 HM Topic             Component Ref Range & Units 10:13 (10/20/22) 1 mo ago (08/22/22) 2 mo ago (08/01/22) 2 mo ago (08/01/22) 3 mo ago (07/20/22) 3 mo ago (07/04/22) 4 mo ago (06/20/22)  Sodium 135 - 145 mmol/L 140 142  142 140 142 140  Potassium 3.5 - 5.1 mmol/L 4.0 3.6  4.0 4.1 4.4 4.0  Chloride 98 - 111 mmol/L 108 110  110 108 111 112 High   CO2 22 - 32 mmol/L '24 29  26 25 26 25  '$ Glucose, Bld 70 - 99 mg/dL 135 High  118 High  CM  121 High  CM 126 High  CM 150 High  CM 189 High  CM  Comment: Glucose reference range applies only to samples taken after fasting for at least 8 hours.  BUN 8 - 23 mg/dL 24 High  '20  19 21 22 18  '$ Creatinine, Ser 0.61 - 1.24 mg/dL 1.26 High  1.29 High   1.19 1.22 1.31 High  1.15   Calcium 8.9 - 10.3 mg/dL 8.8 Low  9.1  8.9 9.0 8.9 8.5 Low   Total Protein 6.5 - 8.1 g/dL 7.4 7.3 6.9  7.4 7.2 6.9  Albumin 3.5 - 5.0 g/dL 3.8 3.8 3.8  4.0 3.8 3.6  AST 15 - 41 U/L '19 20 19  20 20 16  '$ ALT 0 - 44 U/L '20 20 17  19 18 15  '$ Alkaline Phosphatase 38 - 126 U/L 60 47 47  55 50 57  Total Bilirubin 0.3 - 1.2 mg/dL 0.6 0.8 0.8  0.6 0.6 0.5  GFR, Estimated >60 mL/min 59 Low  58 Low  CM  >60 CM >60 CM 57 Low  CM >60 CM  Comment: (NOTE) Calculated using the CKD-EPI Creatinine Equation (2021)  Anion gap 5 - '15 8 3 '$ Low  CM  6 CM 7 CM 5 CM 3 Low  CM  Comment: Performed at [facility], [address] [place], [place] [address]  Resulting Agency  [facility] CLIN LAB [place] CLIN LAB [place] CLIN LAB [place] CLIN LAB [place] CLIN LAB [place] CLIN LAB [place] [facility] LAB         Specimen Collected: 10/20/22 10:13 Last Resulted: 10/20/22 10:40

## 2022-10-20 NOTE — Assessment & Plan Note (Signed)
Non invasive bladder cancer s/p  tx'd with TURBT in 2020 and then BCG, follow up with urology

## 2022-10-20 NOTE — Assessment & Plan Note (Addendum)
Recurrent DVT and embolism, s/p IVC filter Continue long term anticoagulation with Eliquis '5mg'$  BID [ patient gets refills through PCP's office]. Also on Plavix.  He has upcoming IVC filter removal procedure, will discuss with Dr. Delana Meyer regarding peri procedure option.

## 2022-10-20 NOTE — Addendum Note (Signed)
Addended by: Earlie Server on: 10/20/2022 10:47 PM   Modules accepted: Orders

## 2022-10-20 NOTE — Progress Notes (Signed)
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 He tolerates hydroxyurea well. Platelet is wnl Continue Hydroxyurea '500mg'$  daily. Refill was sent.   Bladder cancer ([place]) Non invasive bladder cancer s/p  tx'd with TURBT in 2020 and then BCG, follow up with urology  Iron deficiency anemia Iron panel has improved, still low.  Recommend patient to continue Vitron C every other day.   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 Also on Plavix.  He has upcoming IVC filter removal procedure, will discuss with Dr. Delana Meyer regarding peri procedure option.   Orders Placed This Encounter  Procedures   CBC with Differential/Platelet    Standing Status:   Future    Standing Expiration Date:   10/20/2023   Comprehensive metabolic panel    Standing Status:   Future    Standing Expiration Date:   10/20/2023   Follow up  3 months lab MD  All questions were answered. The patient knows to call the clinic with any problems, questions or concerns.  Earlie Server, MD, PhD [facility] Hematology Oncology 10/20/2022    CHIEF COMPLAINTS/REASON FOR VISIT:  DVT/PE  Pertinent hematology oncology history   Patient has significant history of previous DVT and PE.   Patient was seen by me in March 2020 and declined further follow-up. 2013 history of prostate cancer treated with cryo therapy  01/03/2016 history of superficial venous thrombosis demonstrated in the  greater saphenous vein-.  Also history of superficial thrombophlebitis of segment  of left the basal leg vein in the upper arm-08/24/2017.  No anticoagulation was recommended at that time due to the SVT features. 11/25/2018, right lower extremity acute DVT, started on Lovenox for about a week, 12/05/2018, patient underwent TURBT and intravesical instillation of gemcitabine for noninvasive low-grade papillary urothelial carcinoma.  Lovenox was held for 2 days prior and for 24 hours after procedure, and also started on Eliquis 12/15/2018, developed acute nonocclusive segmental and subsegmental pulmonary emboli within the right upper lobe, right middle lobe, right lower lobe pulmonary branches.  Patient was admitted to the hospital and started on heparin drip.  Patient was seen by me during his admission at that time.  Lovenox 1 mg/kg twice daily was recommended at discharge.  He declined further follow-up with me in the clinic.   His anticoagulation was managed by primary care provider Dr. Sabra Heck.  Patient was on Coumadin for anticoagulation until Feb 2023.  Patient did not check INR since September 2022.  Patient also developed supratherapeutic INR due to interaction between Coumadin and Diflucan for treatment of esophageal candidiasis.  Patient was off Coumadin on 11/17/2021.  11/21/2021 developed acute nonocclusive DVT, started on Lovenox decision was made to switch to Eliquis.   03/09/2022, artificial urinary sphincter placement.  His Eliquis was held for 1 week prior to the procedure.  He has noticed left lower extremity pain 1 to 2 days after holding Eliquis. 03/11/2022, ER visit due to progressively worsening left lower extremity pain. CT angio aortobifemoral with and without contrast  showed eccentric thrombus in the left outflow vessels with distal occlusion of left renal vessels.  Delayed filling of the right popliteal artery is concerning for underlying occlusions and thromboembolic disease in the right  runoff vessels Started on heparin drip in ED. 03/11/2022, vascular surgeon lower extremity bypass surgery 03/12/2022, severe left lower extremity pain, return to the OR for left lower extremity thrombectomy.  Continued on heparin drip. 03/16/2022, left lower extremity extremity nonsalvageable.  Status post BKA.  Per my discussion with pharmacy, heparin was held around 9 AM on 03/16/2022 and resumed on 03/17/2022 9:30 PM. . 03/17/2022, acute respiratory failure, CT chest angiogram showed bilateral central pulm emboli involving the main pulmonary arteries and extending into all lobar branches with nearly occlusive clot to the right lower lobe, left upper lobe, several segmental branches.  CT evidence of right heart strain.Mildly increased sclerotic focus    03/18/2022, thrombolysis, mechanical thrombectomy.  Heparin gtt restarted after procedure. 03/19/2022, heparin gtt was continued. 03/19/2022, bilateral lower extremity ultrasound showed thrombosis of right femoral popliteal and calf veins.  Partially thrombosed left profunda femoris vein. 03/19/2022 IVC filter placement. 03/20/2022, still have significant hypoxia.  On heparin gtt. 03/21/2022 -03/22/2022, on  heparin gtt. 03/23/2022, CT angio chest.showed increased clot burden within right pulmonary artery and the right lower lobe pulmonary emboli with similar clot burden in the right middle lobe, right upper lobe, left lower lobe.  Single residual fibrin strand/clot at the bifurcation of the pulmonary artery with decreased clot burden in the left upper lobe artery branches.  Persistent right heart strain.  Findings suspicious for developing pulmonary infarcts. 03/23/2022, mechanical embolectomy of pulmonary arteries as well as right external iliac vein, common iliac vein and inferior vena cava.   Given that patient developed recurrent PE/DVT despite being therapeutic on heparin GTT, post thrombectomy, patient was switched to Angiomax and later started on Argatroban  drip  Patient had  hypercoagulable work-up done during the admission. Work-up showed negative factor V Leiden mutation, prothrombin gene imaging.  Negative anticardiolipin antibodies, lupus anticoagulants.  Negative beta-2 glycoprotein antibodies Positive JAK2 V6 48F mutation Patient was discharged on Eliquis.     06/01/22 Bone marrow biopsy showed  BONE MARROW, ASPIRATE, CLOT, CORE:  -Hypercellular marrow (60%) involved by a JAK2 mutated myeloproliferative neoplasm (see comment) Comment: The overall findings are consistent with a myeloproliferative neoplasm. Diagnostic considerations include essential thrombocythemia, polycythemia vera and pre- fibrotic primary myelofibrosis. Importantly, there is no overt increase in blasts or fibrosis.  However, correlation with erythropoietin levels, iron studies, presence/absence of splenomegaly and serum LDH along with cytogenetic studies would be  helpful for a more definitive assessment.   PERIPHERAL BLOOD:   -Thrombocytosis  - Mild leukocytosis  - Normocytic normochromic anemia   Cytogenetics normal   INTERVAL HISTORY COCHISE DINNEEN is a 77 y.o. male who has above history reviewed by me today presents for follow up visit for  recurrent thrombosis, Jak2 V648F mutation myeloproliferative disease  Patient tolerates Hydroxyurea.  No new complaints. He has left lower extremity prosthetics.  Has upcoming procedure for IVC filter removal.    MEDICAL HISTORY:  Past Medical History:  Diagnosis Date   Arthritis    Basal cell carcinoma    L clavicle, L prox forearm- removed years ago    Bladder cancer (HCC)    Bladder neck contracture    CAD (coronary artery disease)    a. 12/2020 NSTEMI/Cath: LM nl, LAD 95ost/p, LCX 50p, RCA nl. EF 45-50%.   Cataract  CKD (chronic kidney disease), stage III [facility])    ED (erectile dysfunction)    Frequency    GERD (gastroesophageal reflux disease)    History of pulmonary embolus (PE) 11/2018   a.  Following LE DVT-->Chronic warfarin.   Hyperlipidemia LDL goal <70    Hypertension    Hypothyroidism    Incontinence of urine    sui, s/p cryoablation   Ischemic cardiomyopathy    a. 11/2018 Echo: EF 60-65%; b. 12/2020 LV Gram: EF 45-50% in setting of NSTEMI.   Kidney stones    Neuropathy    feet   Nocturia    Prostate cancer ([place])    S/P   CRYOABLATION   Right elbow tendinitis    Right Lower Extremity DVT (deep venous thrombosis) ([place]) 11/25/2018   Vertigo    1-2x/yr   Wears glasses     SURGICAL HISTORY: Past Surgical History:  Procedure Laterality Date   AMPUTATION Left 03/16/2022   Procedure: AMPUTATION BELOW KNEE;  Surgeon: Katha Cabal, MD;  Location: [facility] ORS;  Service: Vascular;  Laterality: Left;   ARTHROPLASTY AND TENDON REPAIR LEFT THUMB  JAN 2014   CATARACT EXTRACTION W/PHACO Left 11/16/2020   Procedure: CATARACT EXTRACTION PHACO AND INTRAOCULAR LENS PLACEMENT (IOC) LEFT  7.09 00:56.4 12.6%;  Surgeon: Leandrew Koyanagi, MD;  Location: [place];  Service: Ophthalmology;  Laterality: Left;   CATARACT EXTRACTION W/PHACO Right 12/02/2020   Procedure: CATARACT EXTRACTION PHACO AND INTRAOCULAR LENS PLACEMENT (IOC) RIGHT MALYUGIN 5.52 00:49.7 11.1%;  Surgeon: Leandrew Koyanagi, MD;  Location: [place];  Service: Ophthalmology;  Laterality: Right;   COLONOSCOPY     COLONOSCOPY WITH PROPOFOL N/A 08/09/2017   Procedure: COLONOSCOPY WITH PROPOFOL;  Surgeon: Manya Silvas, MD;  Location: [facility];  Service: Endoscopy;  Laterality: N/A;   CORONARY ANGIOGRAPHY N/A 12/28/2020   Procedure: CORONARY ANGIOGRAPHY;  Surgeon: Sherren Mocha, MD;  Location: [place] CV LAB;  Service: Cardiovascular;  Laterality: N/A;   CORONARY STENT INTERVENTION N/A 12/28/2020   Procedure: CORONARY STENT INTERVENTION;  Surgeon: Sherren Mocha, MD;  Location: [place] CV LAB;  Service: Cardiovascular;  Laterality: N/A;   ESOPHAGOGASTRODUODENOSCOPY (EGD) WITH  PROPOFOL N/A 08/09/2017   Procedure: ESOPHAGOGASTRODUODENOSCOPY (EGD) WITH PROPOFOL;  Surgeon: Manya Silvas, MD;  Location[facility];  Service: Endoscopy;  Laterality: N/A;   GREEN LIGHT LASER TURP (TRANSURETHRAL RESECTION OF PROSTATE N/A 12/14/2015   Procedure: GREEN LIGHT LASER TURP (TRANSURETHRAL RESECTION OF PROSTATE) LASER OF BLADDER NECK CONTRACTURE;  Surgeon: Carolan Clines, MD;  Location: [place];  Service: Urology;  Laterality: N/A;   IVC FILTER INSERTION N/A 03/19/2022   Procedure: IVC FILTER INSERTION;  Surgeon: Serafina Mitchell, MD;  Location: [place] CV LAB;  Service: Cardiovascular;  Laterality: N/A;   LEFT HEART CATH AND CORONARY ANGIOGRAPHY N/A 12/24/2020   Procedure: LEFT HEART CATH AND CORONARY ANGIOGRAPHY and possible PCI and stent;  Surgeon: Yolonda Kida, MD;  Location: [place] CV LAB;  Service: Cardiovascular;  Laterality: N/A;   LEFT HEART CATH AND CORONARY ANGIOGRAPHY N/A 01/06/2021   Procedure: LEFT HEART CATH AND CORONARY ANGIOGRAPHY;  Surgeon: Troy Sine, MD;  Location: [place] CV LAB;  Service: Cardiovascular;  Laterality: N/A;   LOWER EXTREMITY ANGIOGRAPHY Left 03/11/2022   Procedure: Lower Extremity Angiography;  Surgeon: Katha Cabal, MD;  Location: [place] CV LAB;  Service: Cardiovascular;  Laterality: Left;   PENILE PROSTHESIS IMPLANT N/A 12/06/2013   Procedure: PENILE PROTHESIS INFLATABLE;  Surgeon: Pierre Bali  Joya San, MD;  Location: [facility];  Service: Urology;  Laterality: N/A;   PROSTATE CRYOABLATION  06-01-2012  [facility]   PULMONARY THROMBECTOMY N/A 03/18/2022   Procedure: PULMONARY THROMBECTOMY;  Surgeon: Algernon Huxley, MD;  Location: [place] CV LAB;  Service: Cardiovascular;  Laterality: N/A;   PULMONARY THROMBECTOMY Bilateral 03/23/2022   Procedure: PULMONARY THROMBECTOMY;  Surgeon: Katha Cabal, MD;  Location: [place] CV LAB;  Service: Cardiovascular;  Laterality:  Bilateral;   THROMBECTOMY ILIAC ARTERY Left 03/12/2022   Procedure: THROMBECTOMY LEG;  Surgeon: Conrad Eufaula, MD;  Location: [facility];  Service: Vascular;  Laterality: Left;   TRANSURETHRAL RESECTION OF BLADDER NECK N/A 03/20/2015   Procedure: RELEASE BLADDER NECK CONTRACTURE  WITH WOLF BUTTON ELECTRODE ;  Surgeon: Carolan Clines, MD;  Location: [place];  Service: Urology;  Laterality: N/A;   TRANSURETHRAL RESECTION OF BLADDER TUMOR N/A 12/05/2018   Procedure: TRANSURETHRAL RESECTION OF BLADDER TUMOR (TURBT)/CYSTOSCOPY/  INSTILLATION OF Rodell Perna;  Surgeon: Ceasar Mons, MD;  Location[facility];  Service: Urology;  Laterality: N/A;   TRANSURETHRAL RESECTION OF BLADDER TUMOR N/A 01/15/2020   Procedure: TRANSURETHRAL RESECTION OF BLADDER TUMOR (TURBT)/ CYSTOSCOPY;  Surgeon: Ceasar Mons, MD;  Location: [facility];  Service: Urology;  Laterality: N/A;   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      SOCIAL HISTORY: Social History   Socioeconomic History   Marital status: Married    Spouse name: Marcques Wrightsman   Number of children: 3   Years of education: Not on file   Highest education level: Not on file  Occupational History   Not on file  Tobacco Use   Smoking status: Never    Passive exposure: Never   Smokeless tobacco: Never  Vaping Use   Vaping Use: Never used  Substance and Sexual Activity   Alcohol use: No   Drug use: No   Sexual activity: Not Currently    Birth control/protection: None  Other Topics Concern   Not on file  Social History Narrative   Lives locally with wife.  Exercises regularly - gym/golf.  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   3 sons: 1 is a Marine scientist.    Social Determinants of Health   Financial Resource Strain: Not on file  Food Insecurity: No Food Insecurity (09/20/2022)   Hunger Vital Sign    Worried About Running  Out of Food in the Last Year: Never true    Ran Out of Food in the Last Year: Never true  Transportation Needs: No Transportation Needs (09/20/2022)   PRAPARE - Hydrologist (Medical): No    Lack of Transportation (Non-Medical): No  Physical Activity: Not on file  Stress: Not on file  Social Connections: Not on file  Intimate Partner Violence: Not on file    FAMILY HISTORY: Family History  Problem Relation Age of Onset   CAD Father 40    ALLERGIES:  is allergic to doxazosin.  MEDICATIONS:  Current Outpatient Medications  Medication Sig Dispense Refill   acetaminophen (TYLENOL) 500 MG tablet Take 500 mg by mouth every 6 (six) hours as needed.     apixaban (ELIQUIS) 5 MG TABS tablet Take 1 tablet (5 mg total) by mouth 2 (two) times daily. 60 tablet 0   clopidogrel (PLAVIX) 75 MG tablet Take 1 tablet (75 mg total) by mouth daily with breakfast.  90 tablet 3   co-enzyme Q-10 30 MG capsule Take 30 mg by mouth daily.     hydrochlorothiazide (HYDRODIURIL) 25 MG tablet Take 25 mg by mouth daily as needed.     Iron-Vitamin C (VITRON-C) 65-125 MG TABS Take 1 tablet by mouth daily. 90 tablet 1   levothyroxine (SYNTHROID) 50 MCG tablet Take 50 mcg by mouth.     metoprolol tartrate (LOPRESSOR) 25 MG tablet Take 0.5 tablets (12.5 mg total) by mouth 2 (two) times daily. 60 tablet 0   Multiple Vitamin (MULTIVITAMIN) tablet Take 1 tablet by mouth daily.     nitroGLYCERIN (NITROSTAT) 0.4 MG SL tablet Place 1 tablet (0.4 mg total) under the tongue every 5 (five) minutes x 3 doses as needed for chest pain. 25 tablet 3   pantoprazole (PROTONIX) 40 MG tablet Take 1 tablet (40 mg total) by mouth daily. 90 tablet 3   pravastatin (PRAVACHOL) 40 MG tablet Take 40 mg by mouth daily.     traMADol (ULTRAM) 50 MG tablet Take 50 mg by mouth every 6 (six) hours as needed for mild pain.     TRUE METRIX BLOOD GLUCOSE TEST test strip SMARTSIG:Via Meter     Turmeric (QC TUMERIC COMPLEX PO)  Take by mouth.     ascorbic acid (VITAMIN C) 500 MG tablet Take 500 mg by mouth daily. (Patient not taking: Reported on 10/20/2022)     cetirizine (ZYRTEC) 10 MG tablet Take 10 mg by mouth daily.     hydroxyurea (HYDREA) 500 MG capsule Take 1 capsule (500 mg total) by mouth daily. May take with food to minimize GI side effects. 90 capsule 3   No current facility-administered medications for this visit.    Review of Systems  Constitutional:  Negative for appetite change, chills, fatigue, fever and unexpected weight change.  HENT:   Negative for hearing loss and voice change.   Eyes:  Negative for eye problems and icterus.  Respiratory:  Negative for chest tightness, cough and shortness of breath.   Cardiovascular:  Negative for chest pain and leg swelling.  Gastrointestinal:  Negative for abdominal distention and abdominal pain.  Endocrine: Negative for hot flashes.  Genitourinary:  Negative for difficulty urinating, dysuria and frequency.   Musculoskeletal:  Negative for arthralgias.       Left BKA  Skin:  Negative for itching and rash.  Neurological:  Negative for light-headedness and numbness.  Hematological:  Negative for adenopathy. Does not bruise/bleed easily.  Psychiatric/Behavioral:  Negative for confusion.    PHYSICAL EXAMINATION:  Vitals:   10/20/22 1029  BP: 131/74  Pulse: 67  Resp: 19  Temp: 98.9 F (37.2 C)  SpO2: 98%   Filed Weights   10/20/22 1029  Weight: 168 lb 12.8 oz (76.6 kg)    Physical Exam Constitutional:      General: He is not in acute distress.    Comments: He walks with cane.   HENT:     Head: Normocephalic and atraumatic.  Eyes:     General: No scleral icterus. Cardiovascular:     Rate and Rhythm: Normal rate.  Pulmonary:     Effort: Pulmonary effort is normal. No respiratory distress.     Breath sounds: No wheezing.  Abdominal:     General: Bowel sounds are normal. There is no distension.     Palpations: Abdomen is soft.   Musculoskeletal:        General: Normal range of motion.     Cervical back: Normal range  of motion and neck supple.     Comments: Status post left BKA, + left lower limb prosthetics.   Skin:    General: Skin is warm and dry.     Findings: No erythema or rash.  Neurological:     Mental Status: He is alert and oriented to person, place, and time. Mental status is at baseline.     Cranial Nerves: No cranial nerve deficit.     Coordination: Coordination normal.  Psychiatric:        Mood and Affect: Mood normal.     LABORATORY DATA:  I have reviewed the data as listed    Latest Ref Rng & Units 10/20/2022   10:13 AM 08/31/2022   10:49 AM 08/22/2022    4:45 AM  CBC  WBC 4.0 - 10.5 K/uL 12.8  6.6  8.7   Hemoglobin 13.0 - 17.0 g/dL 15.7  14.6  14.7   Hematocrit 39.0 - 52.0 % 48.8  46.5  46.3   Platelets 150 - 400 K/uL 351  315  340       Latest Ref Rng & Units 10/20/2022   10:13 AM 08/22/2022    4:45 AM 08/01/2022    3:52 PM  CMP  Glucose 70 - 99 mg/dL 135  118    BUN 8 - 23 mg/dL 24  20    Creatinine 0.61 - 1.24 mg/dL 1.26  1.29    Sodium 135 - 145 mmol/L 140  142    Potassium 3.5 - 5.1 mmol/L 4.0  3.6    Chloride 98 - 111 mmol/L 108  110    CO2 22 - 32 mmol/L 24  29    Calcium 8.9 - 10.3 mg/dL 8.8  9.1    Total Protein 6.5 - 8.1 g/dL 7.4  7.3  6.9   Total Bilirubin 0.3 - 1.2 mg/dL 0.6  0.8  0.8   Alkaline Phos 38 - 126 U/L 60  47  47   AST 15 - 41 U/L '19  20  19   '$ ALT 0 - 44 U/L '20  20  17       '$ RADIOGRAPHIC STUDIES: I have personally reviewed the radiological images as listed and agreed with the findings in the report. US Venous Img Upper Uni Left  Result Date: 08/22/2022 CLINICAL DATA:  Pain EXAM: Left UPPER EXTREMITY VENOUS DOPPLER ULTRASOUND TECHNIQUE: Gray-scale sonography with graded compression, as well as color Doppler and duplex ultrasound were performed to evaluate the upper extremity deep venous system from the level of the subclavian vein and including the  jugular, axillary, basilic, radial, ulnar and upper cephalic vein. Spectral Doppler was utilized to evaluate flow at rest and with distal augmentation maneuvers. COMPARISON:  None Available. FINDINGS: Contralateral Subclavian Vein: Respiratory phasicity is normal and symmetric with the symptomatic side. No evidence of thrombus. Normal compressibility. Internal Jugular Vein: No evidence of thrombus. Normal compressibility, respiratory phasicity and response to augmentation. Subclavian Vein: No evidence of thrombus. Normal compressibility, respiratory phasicity and response to augmentation. Axillary Vein: No evidence of thrombus. Normal compressibility, respiratory phasicity and response to augmentation. Cephalic Vein: No evidence of thrombus. Normal compressibility, respiratory phasicity and response to augmentation. Basilic Vein: Occlusive superficial phlebitis is seen in mid and distal courses of basilic vein. Brachial Veins: No evidence of thrombus. Normal compressibility, respiratory phasicity and response to augmentation. Radial Veins: No evidence of thrombus. Normal compressibility, respiratory phasicity and response to augmentation. Ulnar Veins: No evidence of thrombus. Normal compressibility, respiratory phasicity and  response to augmentation. Venous Reflux:  None visualized. Other Findings:  None visualized. IMPRESSION: No evidence of DVT within the left upper extremity. There is occlusive superficial phlebitis in basilic vein. Electronically Signed   By: Elmer Picker M.D.   On: 08/22/2022 09:22   CT ABDOMEN PELVIS W CONTRAST  Result Date: 08/01/2022 CLINICAL DATA:  Abdominal and pelvic pain since last evening, difficulty ambulating EXAM: CT ABDOMEN AND PELVIS WITH CONTRAST TECHNIQUE: Multidetector CT imaging of the abdomen and pelvis was performed using the standard protocol following bolus administration of intravenous contrast. RADIATION DOSE REDUCTION: This exam was performed according to the  departmental dose-optimization program which includes automated exposure control, adjustment of the mA and/or kV according to patient size and/or use of iterative reconstruction technique. CONTRAST:  186m OMNIPAQUE IOHEXOL 300 MG/ML  SOLN COMPARISON:  08/01/2022, 05/13/2022 FINDINGS: Lower chest: No acute pleural or parenchymal lung disease. Hepatobiliary: No focal liver abnormality is seen. No gallstones, gallbladder wall thickening, or biliary dilatation. Pancreas: Unremarkable. No pancreatic ductal dilatation or surrounding inflammatory changes. Spleen: Multiple hypodensities within the periphery of the spleen likely reflect small cysts or hemangiomas. Otherwise the spleen is unremarkable. Adrenals/Urinary Tract: Stable bilateral simple and hyperdense renal cysts are again identified, no specific imaging follow-up is required. Scattered areas of bilateral renal cortical scarring again noted. No urinary tract calculi or obstructive uropathy. Bladder is only minimally distended, limiting its evaluation. The adrenals are grossly normal. Stomach/Bowel: No bowel obstruction or ileus. Normal appendix right lower quadrant. No bowel wall thickening or inflammatory change. Vascular/Lymphatic: Stable aortic atherosclerosis. IVC filter identified just inferior to the renal veins. No pathologic adenopathy within the abdomen or pelvis. Reproductive: Prior TURP defect. No gross prostate abnormality. Stable penile prosthesis and reservoir is within the anterior lower pelvis. Other: No free fluid or free intraperitoneal gas. No abdominal wall hernia. Musculoskeletal: No acute or destructive bony lesions. Reconstructed images demonstrate no additional findings. IMPRESSION: 1. No acute intra-abdominal or intrapelvic process. 2. IVC filter. 3.  Aortic Atherosclerosis (ICD10-I70.0). Electronically Signed   By: MRanda NgoM.D.   On: 08/01/2022 18:15   DG Pelvis 1-2 Views  Result Date: 08/01/2022 CLINICAL DATA:  Pelvic pain  last night. EXAM: PELVIS - 1-2 VIEW COMPARISON:  Pelvic radiograph 02/13/2022, CT pelvis 05/13/2022 FINDINGS: Mildly decreased bone mineralization. Minimal bilateral superior femoroacetabular joint space narrowing with mild bilateral superolateral acetabular degenerative osteophytes, unchanged from prior. The bilateral sacroiliac and the pubic symphysis joint spaces are maintained. No acute fracture is seen. No dislocation. Mild vascular calcifications. Incidental note of penile prosthesis. IMPRESSION: 1. No acute fracture. 2. Minimal bilateral femoroacetabular osteoarthritis. Electronically Signed   By: RYvonne KendallM.D.   On: 08/01/2022 13:27

## 2022-10-20 NOTE — Therapy (Signed)
OUTPATIENT PHYSICAL THERAPY LOWER EXTREMITY TREATMENT  Patient Name: Phillip [name] MRN: [id number] DOB:03/06/1946, 77 y.o., male Today's Date: 10/20/2022    PT End of Session - 10/20/22 0810     Visit Number 25    Number of Visits 30    Date for PT Re-Evaluation 10/25/22    PT Start Time 0810    PT Stop Time 0901    PT Time Calculation (min) 51 min    Equipment Utilized During Treatment Gait belt    Activity Tolerance Patient tolerated treatment well    Behavior During Therapy WFL for tasks assessed/performed             Past Medical History:  Diagnosis Date   Arthritis    Basal cell carcinoma    L clavicle, L prox forearm- removed years ago    Bladder cancer ([place])    Bladder neck contracture    CAD (coronary artery disease)    a. 12/2020 NSTEMI/Cath: LM nl, LAD 95ost/p, LCX 50p, RCA nl. EF 45-50%.   Cataract    CKD (chronic kidney disease), stage III [facility])    ED (erectile dysfunction)    Frequency    GERD (gastroesophageal reflux disease)    History of pulmonary embolus (PE) 11/2018   a. Following LE DVT-->Chronic warfarin.   Hyperlipidemia LDL goal <70    Hypertension    Hypothyroidism    Incontinence of urine    sui, s/p cryoablation   Ischemic cardiomyopathy    a. 11/2018 Echo: EF 60-65%; b. 12/2020 LV Gram: EF 45-50% in setting of NSTEMI.   Kidney stones    Neuropathy    feet   Nocturia    Prostate cancer ([place])    S/P   CRYOABLATION   Right elbow tendinitis    Right Lower Extremity DVT (deep venous thrombosis) ([place]) 11/25/2018   Vertigo    1-2x/yr   Wears glasses    Past Surgical History:  Procedure Laterality Date   AMPUTATION Left 03/16/2022   Procedure: AMPUTATION BELOW KNEE;  Surgeon: Katha Cabal, MD;  Location: [facility] ORS;  Service: Vascular;  Laterality: Left;   ARTHROPLASTY AND TENDON REPAIR LEFT THUMB  JAN 2014   CATARACT EXTRACTION W/PHACO Left 11/16/2020   Procedure: CATARACT EXTRACTION PHACO AND INTRAOCULAR LENS PLACEMENT (IOC) LEFT   7.09 00:56.4 12.6%;  Surgeon: Leandrew Koyanagi, MD;  Location: [place];  Service: Ophthalmology;  Laterality: Left;   CATARACT EXTRACTION W/PHACO Right 12/02/2020   Procedure: CATARACT EXTRACTION PHACO AND INTRAOCULAR LENS PLACEMENT (IOC) RIGHT MALYUGIN 5.52 00:49.7 11.1%;  Surgeon: Leandrew Koyanagi, MD;  Location: [place];  Service: Ophthalmology;  Laterality: Right;   COLONOSCOPY     COLONOSCOPY WITH PROPOFOL N/A 08/09/2017   Procedure: COLONOSCOPY WITH PROPOFOL;  Surgeon: Manya Silvas, MD;  Location: [facility] ENDOSCOPY;  Service: Endoscopy;  Laterality: N/A;   CORONARY ANGIOGRAPHY N/A 12/28/2020   Procedure: CORONARY ANGIOGRAPHY;  Surgeon: Sherren Mocha, MD;  Location: [place] CV LAB;  Service: Cardiovascular;  Laterality: N/A;   CORONARY STENT INTERVENTION N/A 12/28/2020   Procedure: CORONARY STENT INTERVENTION;  Surgeon: Sherren Mocha, MD;  Location: [place] CV LAB;  Service: Cardiovascular;  Laterality: N/A;   ESOPHAGOGASTRODUODENOSCOPY (EGD) WITH PROPOFOL N/A 08/09/2017   Procedure: ESOPHAGOGASTRODUODENOSCOPY (EGD) WITH PROPOFOL;  Surgeon: Manya Silvas, MD;  Location: [facility]COPY;  Service: Endoscopy;  Laterality: N/A;   GREEN LIGHT LASER TURP (TRANSURETHRAL RESECTION OF PROSTATE N/A 12/14/2015   Procedure: GREEN LIGHT LASER TURP (TRANSURETHRAL RESECTION OF PROSTATE)  LASER OF BLADDER NECK CONTRACTURE;  Surgeon: Carolan Clines, MD;  Location: [facility];  Service: Urology;  Laterality: N/A;   IVC FILTER INSERTION N/A 03/19/2022   Procedure: IVC FILTER INSERTION;  Surgeon: Serafina Mitchell, MD;  Location: [place] CV LAB;  Service: Cardiovascular;  Laterality: N/A;   LEFT HEART CATH AND CORONARY ANGIOGRAPHY N/A 12/24/2020   Procedure: LEFT HEART CATH AND CORONARY ANGIOGRAPHY and possible PCI and stent;  Surgeon: Yolonda Kida, MD;  Location: [place] CV LAB;  Service: Cardiovascular;  Laterality: N/A;   LEFT HEART  CATH AND CORONARY ANGIOGRAPHY N/A 01/06/2021   Procedure: LEFT HEART CATH AND CORONARY ANGIOGRAPHY;  Surgeon: Troy Sine, MD;  Location: [place] CV LAB;  Service: Cardiovascular;  Laterality: N/A;   LOWER EXTREMITY ANGIOGRAPHY Left 03/11/2022   Procedure: Lower Extremity Angiography;  Surgeon: Katha Cabal, MD;  Location: [place] CV LAB;  Service: Cardiovascular;  Laterality: Left;   PENILE PROSTHESIS IMPLANT N/A 12/06/2013   Procedure: PENILE PROTHESIS INFLATABLE;  Surgeon: Ailene Rud, MD;  Location: [facility];  Service: Urology;  Laterality: N/A;   PROSTATE CRYOABLATION  06-01-2012  [facility]   PULMONARY THROMBECTOMY N/A 03/18/2022   Procedure: PULMONARY THROMBECTOMY;  Surgeon: Algernon Huxley, MD;  Location: [place] CV LAB;  Service: Cardiovascular;  Laterality: N/A;   PULMONARY THROMBECTOMY Bilateral 03/23/2022   Procedure: PULMONARY THROMBECTOMY;  Surgeon: Katha Cabal, MD;  Location: [place] CV LAB;  Service: Cardiovascular;  Laterality: Bilateral;   THROMBECTOMY ILIAC ARTERY Left 03/12/2022   Procedure: THROMBECTOMY LEG;  Surgeon: Conrad [place], MD;  Location: [facility] ORS;  Service: Vascular;  Laterality: Left;   TRANSURETHRAL RESECTION OF BLADDER NECK N/A 03/20/2015   Procedure: RELEASE BLADDER NECK CONTRACTURE  WITH WOLF BUTTON ELECTRODE ;  Surgeon: Carolan Clines, MD;  Location: [place];  Service: Urology;  Laterality: N/A;   TRANSURETHRAL RESECTION OF BLADDER TUMOR N/A 12/05/2018   Procedure: TRANSURETHRAL RESECTION OF BLADDER TUMOR (TURBT)/CYSTOSCOPY/  INSTILLATION OF Rodell Perna;  Surgeon: Ceasar Mons, MD;  Location: [facility];  Service: Urology;  Laterality: N/A;   TRANSURETHRAL RESECTION OF BLADDER TUMOR N/A 01/15/2020   Procedure: TRANSURETHRAL RESECTION OF BLADDER TUMOR (TURBT)/ CYSTOSCOPY;  Surgeon: Ceasar Mons, MD;  Location: [facility];  Service:  Urology;  Laterality: N/A;   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     Patient Active Problem List   Diagnosis Date Noted   Iron deficiency anemia 06/20/2022   Myeloproliferative disorder ([place]) 05/19/2022   History of bladder cancer 05/19/2022   Goals of care, counseling/discussion 03/23/2022   Reactive thrombocytosis 03/21/2022   Paroxysmal atrial fibrillation with RVR ([place]) 03/20/2022   Acute bilateral deep vein thrombosis (DVT) of femoral veins (HCC) 03/20/2022   Acute respiratory failure with hypoxia (HCC) 03/16/2022   Acute on chronic diastolic CHF (congestive heart failure) ([place]) 03/16/2022   PSVT (paroxysmal supraventricular tachycardia) 03/16/2022   AKI (acute kidney injury) ([place]) 03/15/2022   Critical limb ischemia of left lower extremity with ulceration of lower leg ([place]) 03/15/2022   Arterial occlusion 03/11/2022   Elevated troponin    Unstable angina ([place]) 01/05/2021   Hyperlipidemia LDL goal <70    CKD (chronic kidney disease), stage III (HCC)    Coronary artery disease    NSTEMI (non-ST elevated myocardial infarction) ([place]) 12/23/2020   Acquired hypothyroidism 04/20/2020   Chemotherapy-induced neuropathy ([place]) 04/20/2020   Nonthrombocytopenic purpura ([place])  12/03/2019   Hypercoagulable state ([place]) 12/10/2018   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 pylori) infection 09/04/2017   Medicare annual wellness visit, initial 03/31/2017   History of prostate cancer 10/27/2014   Lower urinary tract symptoms (LUTS) 03/18/2012   Nephrolithiasis 03/18/2012    PCP:  Rusty Aus, MD  REFERRING PROVIDER: Kris Hartmann, NP  REFERRING DIAG: Left below knee amputee  THERAPY DIAG:  Gait difficulty  Muscle weakness (generalized)  Left below-knee amputee [facility])  Rationale for Evaluation and Treatment Rehabilitation  ONSET DATE: 03/16/22  SUBJECTIVE:   SUBJECTIVE STATEMENT:   EVALUATION Pt. S/p R BKA after blood clots in lower leg.  Pt. Reports minimum phantom limb symptoms and occasional muscle spasms.  Pt. Takes Baclofen to manage symptoms.  Pt. Received prosthetic leg on Wednesday from [facility].  Pt. Has h/o low back pain and has received spinal epidurals in past (2 years ago).    PERTINENT HISTORY: 1. Hx of BKA, left [facility]) Mr. Sanjiv Petit was seen for further evaluation for left below knee prosthesis.  The is a 77 year old male who had amputation on 03/16/2022.  At the time he is well healed and ready for fitting of new below knee prosthesis.  He is a highly motivated individual and should do well once fitted with prosthesis.  She has no problem returning to a K2 ambulator, which will allow him to walk inside and outside of his home and overload level barriers.    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  He will be having a bone marrow biopsy.  We will continue to have the patient keep his IVC filter for now as there may be further test.  We will have the patient return in 3 months to discuss filter removal.   PAIN:  Are you having pain? No  PRECAUTIONS: None  WEIGHT BEARING RESTRICTIONS No  FALLS:  Has patient fallen in last 6 months? No  LIVING ENVIRONMENT: Lives with: lives with their spouse Lives in: House/apartment Stairs: Yes: Internal: 13+ steps; on right going up Has following equipment at home: Single point cane and Walker - 2 wheeled  OCCUPATION: Retired  PLOF: [place] with walking and return to playing golf by spring.      OBJECTIVE:   PATIENT SURVEYS:  FOTO initial 40/ goal 69.    COGNITION:  Overall cognitive status: Within functional limits for tasks assessed     SENSATION: WFL  EDEMA:  Circumferential: L/R knee joint line (34/36 cm.), calf (30.5/ 33 cm), distal quad (35/36 cm).    POSTURE: rounded shoulders and forward head  PALPATION: No tenderness/ good incision healing on residual limb  LOWER EXTREMITY ROM:  B LE WFL (all planes).  L knee 0-130 deg.   LOWER EXTREMITY MMT:  R LE muscle strength grossly 5/5 MMT except hip flexion 4+/5 MMT.  L LE strength grossly 5/5 MMT except hip flexion 4/5 MMT and hip abduction 4+/5 MMT.    FUNCTIONAL TESTS:  5 times sit to stand: 14.1 sec. And requires R UE assist.  Able  to stand from chair with addition of 2" Airex pad.    GAIT: Distance walked: in clinic/ //-bars Assistive device utilized: Environmental consultant - 2 wheeled Level of assistance: Min A Comments: Ambulates in clinic with RW and min. A/ cuing for proper technique and step pattern.  Pt. Able to ambulate in //-bars with R UE assist only and 2-point gait pattern.    11/27:  5xSTS: 13.04 sec. (No UE assist).  1/11: 5xSTS: 9.47 sec./ 8.19 sec.  R LE muscle strength grossly 5/5 MMT except hip flexion 4+/5 MMT.  L LE strength grossly 5/5 MMT except hip flexion 4/5 MMT and hip abduction 4+/5 MMT.  No significant changes to hip flexor strength noted.    TODAY'S TREATMENT:  10/20/22:  Subjective:  Pt. Reports no pain when entering the PT clinic this morning and no new complaints. Pt. States he had some residual limb soreness after the previous visit but believes it is because his leg was donned wrong. Pt. States the pain/soreness was relieved by the next morning. Pt. Was able to go to the gym yesterday and participate in biking and strength training with no complaints.   There.ex.:  Nustep L6 10 min. B LE only.  Discussed pts. meeting with Staci Righter and trying a pneumatic ankle.     Neuro:  Rebounder: NBOS/ alternating/ tandem stance/ Airex pad with 4# blue ball.    Lateral step ups on 6" step with added Airex L/R 5x each in //-bars with SBA/CGA.    Walking in //-bars: CGA and //-bars for UE support incase of occasional LOB  -Frwd./bkwd./Lateral stepping (3 laps each) with occasional UE support needed.   -High marches. Pt. Demonstrated increase balance. (3 laps)  PATIENT EDUCATION:  Education details: Reviewed HEP Person educated: Patient Education method: Explanation, Demonstration, and Handouts Education comprehension: verbalized understanding and returned demonstration   HOME EXERCISE PROGRAM: See handouts.   ASSESSMENT:  CLINICAL IMPRESSION: Patient is highly motivated and hard working during standing/ walking tasks today. PT noticed a light favor of the uninvolved limb over bearing full weight in his prosthetic limb during gait. Pt. Is continuing to advance his dynamic stability. Pt. Has good prognosis to continue progressing to his hobbies/goals of returning to light golfing. Pt. Has difficulty with tasks involving turning and other stability tasks. Pt. Requires cuing to maintain correct posture when performing activities to avoid swaying of the hips and compensation. Pt. Is cued during backwards walking to further extend his hips to promote balance and increase his BOS. Pt. Cued during frwd. Walking to maintain a stable BOS, stand up tall, and focus on equal B step lengths/cadence. Pt. will benefit from skilled PT services to increase LE muscle strength to improve independence with walking/ return to playing golf.      OBJECTIVE IMPAIRMENTS Abnormal gait, decreased activity tolerance, decreased balance, decreased endurance, decreased mobility, difficulty walking, decreased strength, impaired flexibility, improper body mechanics, postural dysfunction, and pain.   ACTIVITY LIMITATIONS carrying, lifting, bending, standing, squatting, stairs, transfers,  toileting, dressing, and locomotion level  PARTICIPATION LIMITATIONS: driving, community activity, and yard work  PERSONAL FACTORS Fitness and Past/current experiences are also affecting patient's functional outcome.   REHAB POTENTIAL: Good  CLINICAL DECISION MAKING: Stable/uncomplicated  EVALUATION COMPLEXITY: Low   GOALS: Goals reviewed with patient? Yes  SHORT TERM GOALS: Target date: 09/27/22 Pt. Independent with HEP to increase B hip strength 1/2 muscle grade to improve standing/walking mod. Independence.   Baseline:  see above.  L hip flexion 4/5 MMT.  Goal status: Goal met    LONG TERM GOALS: Target date: 10/25/22  Pt. Will increase FOTO to 59 to improve functional mobility.   Baseline:  initial FOTO 40.  11/8: 68 Goal status: Goal met  2.  Pt. Able to ascend/descend 13 stairs with R handrail and consistent step pattern to improve pts. Ability to go to 2nd floor of home.  11/8: recip. Gait on stairs outside with R handrail for safety.   Baseline:  Goal status: Goal met  3.  Pt. Will ambulate with mod. I and SPC with consistent 2-point gait pattern and no loss of balance to improve independence at home/ community.   Baseline:  11/8: pt. Ambulates community distances Goal status: Goal met  4.  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    Baseline:  pt. Requires extra time/ SPC while walking outside and limited with carrying  5.  Pt. Able to return to hitting golf balls/ playing round of golf with no limitations safely.    Baseline:  pt. Demonstrates chipping golf balls in gym with SBA/CGA    PLAN: PT FREQUENCY: 2x/week  PT DURATION: 8 weeks  PLANNED INTERVENTIONS: Therapeutic exercises, Therapeutic activity, Neuromuscular re-education, Balance training, Gait training, Patient/Family education, Self Care, Joint mobilization, Stair training, Prosthetic training, Manual therapy, and Re-evaluation  PLAN FOR NEXT SESSION:  Progress gait to more skilled dynamic tasks.  Simulate golfing.    Parilee Hally B. Rogers Blocker, SPT Pura Spice, PT, DPT # [phone] 10/20/2022, 9:04 AM

## 2022-10-21 NOTE — Telephone Encounter (Signed)
I spoke with the patient and he has been rescheduled from 8:00 am with a 6:45 am [facility] to 2:30 pm with a 1:30 pm arrival time. Patient was fine with the time change.

## 2022-10-25 ENCOUNTER — Encounter: Payer: Self-pay | Admitting: Physical Therapy

## 2022-10-25 ENCOUNTER — Ambulatory Visit: Payer: HMO | Admitting: Physical Therapy

## 2022-10-25 DIAGNOSIS — Z89512 Acquired absence of left leg below knee: Secondary | ICD-10-CM

## 2022-10-25 DIAGNOSIS — R269 Unspecified abnormalities of gait and mobility: Secondary | ICD-10-CM | POA: Diagnosis not present

## 2022-10-25 DIAGNOSIS — M6281 Muscle weakness (generalized): Secondary | ICD-10-CM

## 2022-10-25 NOTE — Therapy (Signed)
OUTPATIENT PHYSICAL THERAPY LOWER EXTREMITY TREATMENT  Patient Name: Phillip [name] MRN: [id number] DOB:[birth year], 77 y.o., male Today's Date: 10/25/2022    PT End of Session - 10/25/22 0835     Visit Number 26    Number of Visits 30    Date for PT Re-Evaluation 10/25/22    PT Start Time 0812    PT Stop Time 0905    PT Time Calculation (min) 53 min    Equipment Utilized During Treatment Gait belt    Activity Tolerance Patient tolerated treatment well    Behavior During Therapy WFL for tasks assessed/performed             Past Medical History:  Diagnosis Date   Arthritis    Basal cell carcinoma    L clavicle, L prox forearm- removed years ago    Bladder cancer ([place])    Bladder neck contracture    CAD (coronary artery disease)    a. 12/2020 NSTEMI/Cath: LM nl, LAD 95ost/p, LCX 50p, RCA nl. EF 45-50%.   Cataract    CKD (chronic kidney disease), stage III [facility])    ED (erectile dysfunction)    Frequency    GERD (gastroesophageal reflux disease)    History of pulmonary embolus (PE) 11/2018   a. Following LE DVT-->Chronic warfarin.   Hyperlipidemia LDL goal <70    Hypertension    Hypothyroidism    Incontinence of urine    sui, s/p cryoablation   Ischemic cardiomyopathy    a. 11/2018 Echo: EF 60-65%; b. 12/2020 LV Gram: EF 45-50% in setting of NSTEMI.   Kidney stones    Neuropathy    feet   Nocturia    Prostate cancer ([place])    S/P   CRYOABLATION   Right elbow tendinitis    Right Lower Extremity DVT (deep venous thrombosis) ([place]) 11/25/2018   Vertigo    1-2x/yr   Wears glasses    Past Surgical History:  Procedure Laterality Date   AMPUTATION Left 03/16/2022   Procedure: AMPUTATION BELOW KNEE;  Surgeon: Katha Cabal, MD;  Location: [facility];  Service: Vascular;  Laterality: Left;   ARTHROPLASTY AND TENDON REPAIR LEFT THUMB  JAN 2014   CATARACT EXTRACTION W/PHACO Left 11/16/2020   Procedure: CATARACT EXTRACTION PHACO AND INTRAOCULAR LENS PLACEMENT (IOC) LEFT   7.09 00:56.4 12.6%;  Surgeon: Leandrew Koyanagi, MD;  Location: [place];  Service: Ophthalmology;  Laterality: Left;   CATARACT EXTRACTION W/PHACO Right 12/02/2020   Procedure: CATARACT EXTRACTION PHACO AND INTRAOCULAR LENS PLACEMENT (IOC) RIGHT MALYUGIN 5.52 00:49.7 11.1%;  Surgeon: Leandrew Koyanagi, MD;  Location: [place];  Service: Ophthalmology;  Laterality: Right;   COLONOSCOPY     COLONOSCOPY WITH PROPOFOL N/A 08/09/2017   Procedure: COLONOSCOPY WITH PROPOFOL;  Surgeon: Manya Silvas, MD;  Location: [facility] ENDOSCOPY;  Service: Endoscopy;  Laterality: N/A;   CORONARY ANGIOGRAPHY N/A 12/28/2020   Procedure: CORONARY ANGIOGRAPHY;  Surgeon: Sherren Mocha, MD;  Location: [place] [facility];  Service: Cardiovascular;  Laterality: N/A;   CORONARY STENT INTERVENTION N/A 12/28/2020   Procedure: CORONARY STENT INTERVENTION;  Surgeon: Sherren Mocha, MD;  Location: [place] [facility];  Service: Cardiovascular;  Laterality: N/A;   ESOPHAGOGASTRODUODENOSCOPY (EGD) WITH PROPOFOL N/A 08/09/2017   Procedure: ESOPHAGOGASTRODUODENOSCOPY (EGD) WITH PROPOFOL;  Surgeon: Manya Silvas, MD;  Location: [facility] ENDOSCOPY;  Service: Endoscopy;  Laterality: N/A;   GREEN LIGHT LASER TURP (TRANSURETHRAL RESECTION OF PROSTATE N/A 12/14/2015   Procedure: GREEN LIGHT LASER TURP (TRANSURETHRAL RESECTION OF PROSTATE)  LASER OF BLADDER NECK CONTRACTURE;  Surgeon: Carolan Clines, MD;  Location: [facility];  Service: Urology;  Laterality: N/A;   IVC FILTER INSERTION N/A 03/19/2022   Procedure: IVC FILTER INSERTION;  Surgeon: Serafina Mitchell, MD;  Location: [place] [facility];  Service: Cardiovascular;  Laterality: N/A;   LEFT HEART CATH AND CORONARY ANGIOGRAPHY N/A 12/24/2020   Procedure: LEFT HEART CATH AND CORONARY ANGIOGRAPHY and possible PCI and stent;  Surgeon: Yolonda Kida, MD;  Location: [place] [facility];  Service: Cardiovascular;  Laterality: N/A;   LEFT HEART  CATH AND CORONARY ANGIOGRAPHY N/A 01/06/2021   Procedure: LEFT HEART CATH AND CORONARY ANGIOGRAPHY;  Surgeon: Troy Sine, MD;  Location: [place] [facility];  Service: Cardiovascular;  Laterality: N/A;   LOWER EXTREMITY ANGIOGRAPHY Left 03/11/2022   Procedure: Lower Extremity Angiography;  Surgeon: Katha Cabal, MD;  Location: [place] [facility];  Service: Cardiovascular;  Laterality: Left;   PENILE PROSTHESIS IMPLANT N/A 12/06/2013   Procedure: PENILE PROTHESIS INFLATABLE;  Surgeon: Ailene Rud, MD;  Location: [facility];  Service: Urology;  Laterality: N/A;   PROSTATE CRYOABLATION  06-01-2012  [facility]   PULMONARY THROMBECTOMY N/A 03/18/2022   Procedure: PULMONARY THROMBECTOMY;  Surgeon: Algernon Huxley, MD;  Location: [place] [facility];  Service: Cardiovascular;  Laterality: N/A;   PULMONARY THROMBECTOMY Bilateral 03/23/2022   Procedure: PULMONARY THROMBECTOMY;  Surgeon: Katha Cabal, MD;  Location: [place] [facility];  Service: Cardiovascular;  Laterality: Bilateral;   THROMBECTOMY ILIAC ARTERY Left 03/12/2022   Procedure: THROMBECTOMY LEG;  Surgeon: Conrad [place], MD;  Location: [facility];  Service: Vascular;  Laterality: Left;   TRANSURETHRAL RESECTION OF BLADDER NECK N/A 03/20/2015   Procedure: RELEASE BLADDER NECK CONTRACTURE  WITH WOLF BUTTON ELECTRODE ;  Surgeon: Carolan Clines, MD;  Location: [place];  Service: Urology;  Laterality: N/A;   TRANSURETHRAL RESECTION OF BLADDER TUMOR N/A 12/05/2018   Procedure: TRANSURETHRAL RESECTION OF BLADDER TUMOR (TURBT)/CYSTOSCOPY/  INSTILLATION OF Rodell Perna;  Surgeon: Ceasar Mons, MD;  Location: [facility];  Service: Urology;  Laterality: N/A;   TRANSURETHRAL RESECTION OF BLADDER TUMOR N/A 01/15/2020   Procedure: TRANSURETHRAL RESECTION OF BLADDER TUMOR (TURBT)/ CYSTOSCOPY;  Surgeon: Ceasar Mons, MD;  Location: [facility];  Service:  Urology;  Laterality: N/A;   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     Patient Active Problem List   Diagnosis Date Noted   Iron deficiency anemia 06/20/2022   Myeloproliferative disorder ([place]) 05/19/2022   History of bladder cancer 05/19/2022   Goals of care, counseling/discussion 03/23/2022   Reactive thrombocytosis 03/21/2022   Paroxysmal atrial fibrillation with RVR ([place]) 03/20/2022   Acute bilateral deep vein thrombosis (DVT) of femoral veins (HCC) 03/20/2022   Acute respiratory failure with hypoxia (HCC) 03/16/2022   Acute on chronic diastolic CHF (congestive heart failure) ([place]) 03/16/2022   PSVT (paroxysmal supraventricular tachycardia) 03/16/2022   AKI (acute kidney injury) ([place]) 03/15/2022   Critical limb ischemia of left lower extremity with ulceration of lower leg ([place]) 03/15/2022   Arterial occlusion 03/11/2022   Elevated troponin    Unstable angina ([place]) 01/05/2021   Hyperlipidemia LDL goal <70    CKD (chronic kidney disease), stage III (HCC)    Coronary artery disease    NSTEMI (non-ST elevated myocardial infarction) ([place]) 12/23/2020   Acquired hypothyroidism 04/20/2020   Chemotherapy-induced neuropathy ([place]) 04/20/2020   Nonthrombocytopenic purpura ([place])  12/03/2019   Hypercoagulable state ([place]) 12/10/2018   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 pylori) infection 09/04/2017   Medicare annual wellness visit, initial 03/31/2017   History of prostate cancer 10/27/2014   Lower urinary tract symptoms (LUTS) 03/18/2012   Nephrolithiasis 03/18/2012    PCP:  Rusty Aus, MD  REFERRING PROVIDER: Kris Hartmann, NP  REFERRING DIAG: Left below knee amputee  THERAPY DIAG:  Gait difficulty  Muscle weakness (generalized)  Left below-knee amputee [facility])  Rationale for Evaluation and Treatment Rehabilitation  ONSET DATE: 03/16/22  SUBJECTIVE:   SUBJECTIVE STATEMENT:   EVALUATION Pt. S/p R BKA after blood clots in lower leg.  Pt. Reports minimum phantom limb symptoms and occasional muscle spasms.  Pt. Takes Baclofen to manage symptoms.  Pt. Received prosthetic leg on Wednesday from [facility].  Pt. Has h/o low back pain and has received spinal epidurals in past (2 years ago).    PERTINENT HISTORY: 1. Hx of BKA, left [facility]) Phillip [name] was seen for further evaluation for left below knee prosthesis.  The is a 77 year old male who had amputation on 03/16/2022.  At the time he is well healed and ready for fitting of new below knee prosthesis.  He is a highly motivated individual and should do well once fitted with prosthesis.  She has no problem returning to a K2 ambulator, which will allow him to walk inside and outside of his home and overload level barriers.    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  He will be having a bone marrow biopsy.  We will continue to have the patient keep his IVC filter for now as there may be further test.  We will have the patient return in 3 months to discuss filter removal.   PAIN:  Are you having pain? No  PRECAUTIONS: None  WEIGHT BEARING RESTRICTIONS No  FALLS:  Has patient fallen in last 6 months? No  LIVING ENVIRONMENT: Lives with: lives with their spouse Lives in: House/apartment Stairs: Yes: Internal: 13+ steps; on right going up Has following equipment at home: Single point cane and Walker - 2 wheeled  OCCUPATION: Retired  PLOF: [place] with walking and return to playing golf by spring.      OBJECTIVE:   PATIENT SURVEYS:  FOTO initial 40/ goal 69.    COGNITION:  Overall cognitive status: Within functional limits for tasks assessed     SENSATION: WFL  EDEMA:  Circumferential: L/R knee joint line (34/36 cm.), calf (30.5/ 33 cm), distal quad (35/36 cm).    POSTURE: rounded shoulders and forward head  PALPATION: No tenderness/ good incision healing on residual limb  LOWER EXTREMITY ROM:  B LE WFL (all planes).  L knee 0-130 deg.   LOWER EXTREMITY MMT:  R LE muscle strength grossly 5/5 MMT except hip flexion 4+/5 MMT.  L LE strength grossly 5/5 MMT except hip flexion 4/5 MMT and hip abduction 4+/5 MMT.    FUNCTIONAL TESTS:  5 times sit to stand: 14.1 sec. And requires R UE assist.  Able  to stand from chair with addition of 2" Airex pad.    GAIT: Distance walked: in clinic/ //-bars Assistive device utilized: Environmental consultant - 2 wheeled Level of assistance: Min A Comments: Ambulates in clinic with RW and min. A/ cuing for proper technique and step pattern.  Pt. Able to ambulate in //-bars with R UE assist only and 2-point gait pattern.    11/27:  5xSTS: 13.04 sec. (No UE assist).  1/11: 5xSTS: 9.47 sec./ 8.19 sec.  R LE muscle strength grossly 5/5 MMT except hip flexion 4+/5 MMT.  L LE strength grossly 5/5 MMT except hip flexion 4/5 MMT and hip abduction 4+/5 MMT.  No significant changes to hip flexor strength noted.    TODAY'S TREATMENT:  10/25/22:  Subjective:  Pt. Reports no pain when entering the PT clinic this morning and no new complaints.  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  Pt. States he hit golf balls the other day and did better than last week.    There.ex.:  Nustep L6 10 min. B LE only.  Discussed upcoming surgery and hitting golf balls.     Neuro:  Rebounder: NBOS/ alternating/ tandem stance/ Airex pad with 4# blue ball.  SBA/CGA for safety.    Lateral step ups on 6" step with added Airex L/R 10x each in //-bars with SBA/CGA.     Walking in hallway with added head turns/ 6" hurdles with SBA for safety.  Cuing to prevent hip circumduction and focus on midline hip flexion/ heel strike.     Agility ladder: coordination tasks timed from start to finish.  26.59 seconds/ 22.3 seconds.    Walking to car/ curb step downs (no issues)- good quad control.     PATIENT EDUCATION:  Education details: Reviewed HEP Person educated: Patient Education method: Explanation, Demonstration, and Handouts Education comprehension: verbalized understanding and returned demonstration   HOME EXERCISE PROGRAM: See handouts.   ASSESSMENT:  CLINICAL IMPRESSION: Patient is highly motivated and hard working during standing/ walking tasks today. Pt. Is continuing to advance his dynamic stability. Pt. Has good prognosis to continue progressing to his hobbies/goals of returning to light golfing. Pt. Has difficulty with tasks involving turning and other dynamic maneuvers at agility ladder.  Pt. Requires cuing to maintain correct posture when performing activities to avoid swaying of the hips and compensation.  Good walking cadence in hallway with addition of hurdles.  Pt. Challenged during head turns.   Pt. will benefit from skilled PT services to increase LE muscle strength to improve independence with walking/ return to playing golf.      OBJECTIVE IMPAIRMENTS Abnormal gait, decreased activity tolerance, decreased balance, decreased endurance, decreased mobility, difficulty walking, decreased strength, impaired flexibility, improper body mechanics, postural dysfunction, and pain.   ACTIVITY LIMITATIONS carrying, lifting, bending, standing, squatting, stairs, transfers, toileting, dressing, and locomotion level  PARTICIPATION LIMITATIONS: driving, community activity, and yard work  PERSONAL FACTORS Fitness and Past/current experiences are also affecting patient's functional outcome.   REHAB POTENTIAL: Good  CLINICAL DECISION MAKING:  Stable/uncomplicated  EVALUATION COMPLEXITY: Low   GOALS: Goals reviewed with patient? Yes  SHORT TERM GOALS: Target date: 09/27/22 Pt. Independent with HEP to increase B hip strength 1/2 muscle grade to improve standing/walking mod. Independence.   Baseline:  see above.  L hip flexion 4/5 MMT.  Goal status: Goal met    LONG TERM GOALS: Target date: 10/25/22  Pt. Will increase FOTO to 59 to improve functional mobility.   Baseline:  initial FOTO  40.  11/8: 68 Goal status: Goal met  2.  Pt. Able to ascend/descend 13 stairs with R handrail and consistent step pattern to improve pts. Ability to go to 2nd floor of home.  11/8: recip. Gait on stairs outside with R handrail for safety.   Baseline:  Goal status: Goal met  3.  Pt. Will ambulate with mod. I and SPC with consistent 2-point gait pattern and no loss of balance to improve independence at home/ community.   Baseline:  11/8: pt. Ambulates community distances Goal status: Goal met  4.  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    Baseline:  pt. Requires extra time/ SPC while walking outside and limited with carrying  5.  Pt. Able to return to hitting golf balls/ playing round of golf with no limitations safely.    Baseline:  pt. Demonstrates chipping golf balls in gym with SBA/CGA    PLAN: PT FREQUENCY: 2x/week  PT DURATION: 8 weeks  PLANNED INTERVENTIONS: Therapeutic exercises, Therapeutic activity, Neuromuscular re-education, Balance training, Gait training, Patient/Family education, Self Care, Joint mobilization, Stair training, Prosthetic training, Manual therapy, and Re-evaluation  PLAN FOR NEXT SESSION: Progress gait to more skilled dynamic tasks.  Simulate golfing.   CHECK GOAL/ RECERT  Ashlyn B. Rogers Blocker, SPT Pura Spice, PT, DPT # [phone] 10/25/2022, 9:05 AM

## 2022-10-27 ENCOUNTER — Ambulatory Visit: Payer: HMO | Admitting: Physical Therapy

## 2022-10-27 DIAGNOSIS — R269 Unspecified abnormalities of gait and mobility: Secondary | ICD-10-CM | POA: Diagnosis not present

## 2022-10-27 DIAGNOSIS — M6281 Muscle weakness (generalized): Secondary | ICD-10-CM

## 2022-10-27 DIAGNOSIS — Z89512 Acquired absence of left leg below knee: Secondary | ICD-10-CM

## 2022-10-27 NOTE — Therapy (Signed)
OUTPATIENT PHYSICAL THERAPY LOWER EXTREMITY TREATMENT/ RECERTIFICATION  Patient Name: Phillip [name] MRN: [id number] DOB:1946/03/11, 77 y.o., male Today's Date: 10/27/2022    PT End of Session - 10/27/22 0827     Visit Number 27    Number of Visits 35    Date for PT Re-Evaluation 11/24/22    PT Start Time 0825    PT Stop Time 0913    PT Time Calculation (min) 48 min    Equipment Utilized During Treatment Gait belt    Activity Tolerance Patient tolerated treatment well    Behavior During Therapy WFL for tasks assessed/performed             Past Medical History:  Diagnosis Date   Arthritis    Basal cell carcinoma    L clavicle, L prox forearm- removed years ago    Bladder cancer ([place])    Bladder neck contracture    CAD (coronary artery disease)    a. 12/2020 NSTEMI/Cath: LM nl, LAD 95ost/p, LCX 50p, RCA nl. EF 45-50%.   Cataract    CKD (chronic kidney disease), stage III [facility])    ED (erectile dysfunction)    Frequency    GERD (gastroesophageal reflux disease)    History of pulmonary embolus (PE) 11/2018   a. Following LE DVT-->Chronic warfarin.   Hyperlipidemia LDL goal <70    Hypertension    Hypothyroidism    Incontinence of urine    sui, s/p cryoablation   Ischemic cardiomyopathy    a. 11/2018 Echo: EF 60-65%; b. 12/2020 LV Gram: EF 45-50% in setting of NSTEMI.   Kidney stones    Neuropathy    feet   Nocturia    Prostate cancer ([place])    S/P   CRYOABLATION   Right elbow tendinitis    Right Lower Extremity DVT (deep venous thrombosis) ([place]) 11/25/2018   Vertigo    1-2x/yr   Wears glasses    Past Surgical History:  Procedure Laterality Date   AMPUTATION Left 03/16/2022   Procedure: AMPUTATION BELOW KNEE;  Surgeon: Katha Cabal, MD;  Location: [facility];  Service: Vascular;  Laterality: Left;   ARTHROPLASTY AND TENDON REPAIR LEFT THUMB  JAN 2014   CATARACT EXTRACTION W/PHACO Left 11/16/2020   Procedure: CATARACT EXTRACTION PHACO AND INTRAOCULAR LENS  PLACEMENT (IOC) LEFT  7.09 00:56.4 12.6%;  Surgeon: Leandrew Koyanagi, MD;  Location: [place];  Service: Ophthalmology;  Laterality: Left;   CATARACT EXTRACTION W/PHACO Right 12/02/2020   Procedure: CATARACT EXTRACTION PHACO AND INTRAOCULAR LENS PLACEMENT (IOC) RIGHT MALYUGIN 5.52 00:49.7 11.1%;  Surgeon: Leandrew Koyanagi, MD;  Location: [place];  Service: Ophthalmology;  Laterality: Right;   COLONOSCOPY     COLONOSCOPY WITH PROPOFOL N/A 08/09/2017   Procedure: COLONOSCOPY WITH PROPOFOL;  Surgeon: Manya Silvas, MD;  Location: [facility];  Service: Endoscopy;  Laterality: N/A;   CORONARY ANGIOGRAPHY N/A 12/28/2020   Procedure: CORONARY ANGIOGRAPHY;  Surgeon: Sherren Mocha, MD;  Location: [place] [facility];  Service: Cardiovascular;  Laterality: N/A;   CORONARY STENT INTERVENTION N/A 12/28/2020   Procedure: CORONARY STENT INTERVENTION;  Surgeon: Sherren Mocha, MD;  Location: [place] [facility];  Service: Cardiovascular;  Laterality: N/A;   ESOPHAGOGASTRODUODENOSCOPY (EGD) WITH PROPOFOL N/A 08/09/2017   Procedure: ESOPHAGOGASTRODUODENOSCOPY (EGD) WITH PROPOFOL;  Surgeon: Manya Silvas, MD;  Location: [facility] ENDOSCOPY;  Service: Endoscopy;  Laterality: N/A;   GREEN LIGHT LASER TURP (TRANSURETHRAL RESECTION OF PROSTATE N/A 12/14/2015   Procedure: GREEN LIGHT LASER TURP (TRANSURETHRAL RESECTION OF  PROSTATE) LASER OF BLADDER NECK CONTRACTURE;  Surgeon: Carolan Clines, MD;  Location: [place];  Service: Urology;  Laterality: N/A;   IVC FILTER INSERTION N/A 03/19/2022   Procedure: IVC FILTER INSERTION;  Surgeon: Serafina Mitchell, MD;  Location: [place] [facility];  Service: Cardiovascular;  Laterality: N/A;   LEFT HEART CATH AND CORONARY ANGIOGRAPHY N/A 12/24/2020   Procedure: LEFT HEART CATH AND CORONARY ANGIOGRAPHY and possible PCI and stent;  Surgeon: Yolonda Kida, MD;  Location: [place] [facility];  Service: Cardiovascular;   Laterality: N/A;   LEFT HEART CATH AND CORONARY ANGIOGRAPHY N/A 01/06/2021   Procedure: LEFT HEART CATH AND CORONARY ANGIOGRAPHY;  Surgeon: Troy Sine, MD;  Location: [place] [facility];  Service: Cardiovascular;  Laterality: N/A;   LOWER EXTREMITY ANGIOGRAPHY Left 03/11/2022   Procedure: Lower Extremity Angiography;  Surgeon: Katha Cabal, MD;  Location: [place] [facility];  Service: Cardiovascular;  Laterality: Left;   PENILE PROSTHESIS IMPLANT N/A 12/06/2013   Procedure: PENILE PROTHESIS INFLATABLE;  Surgeon: Ailene Rud, MD;  Location[facility];  Service: Urology;  Laterality: N/A;   PROSTATE CRYOABLATION  06-01-2012  [place]   PULMONARY THROMBECTOMY N/A 03/18/2022   Procedure: PULMONARY THROMBECTOMY;  Surgeon: Algernon Huxley, MD;  Location: [place] [facility];  Service: Cardiovascular;  Laterality: N/A;   PULMONARY THROMBECTOMY Bilateral 03/23/2022   Procedure: PULMONARY THROMBECTOMY;  Surgeon: Katha Cabal, MD;  Location: [place] [facility];  Service: Cardiovascular;  Laterality: Bilateral;   THROMBECTOMY ILIAC ARTERY Left 03/12/2022   Procedure: THROMBECTOMY LEG;  Surgeon: Conrad Aptos, MD;  Location: [facility];  Service: Vascular;  Laterality: Left;   TRANSURETHRAL RESECTION OF BLADDER NECK N/A 03/20/2015   Procedure: RELEASE BLADDER NECK CONTRACTURE  WITH WOLF BUTTON ELECTRODE ;  Surgeon: Carolan Clines, MD;  Location: [place];  Service: Urology;  Laterality: N/A;   TRANSURETHRAL RESECTION OF BLADDER TUMOR N/A 12/05/2018   Procedure: TRANSURETHRAL RESECTION OF BLADDER TUMOR (TURBT)/CYSTOSCOPY/  INSTILLATION OF Rodell Perna;  Surgeon: Ceasar Mons, MD;  Location: [facility];  Service: Urology;  Laterality: N/A;   TRANSURETHRAL RESECTION OF BLADDER TUMOR N/A 01/15/2020   Procedure: TRANSURETHRAL RESECTION OF BLADDER TUMOR (TURBT)/ CYSTOSCOPY;  Surgeon: Ceasar Mons, MD;  [facility];  Service: Urology;  Laterality: N/A;   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     Patient Active Problem List   Diagnosis Date Noted   Iron deficiency anemia 06/20/2022   Myeloproliferative disorder ([place]) 05/19/2022   History of bladder cancer 05/19/2022   Goals of care, counseling/discussion 03/23/2022   Reactive thrombocytosis 03/21/2022   Paroxysmal atrial fibrillation with RVR ([place]) 03/20/2022   Acute bilateral deep vein thrombosis (DVT) of femoral veins (HCC) 03/20/2022   Acute respiratory failure with hypoxia (HCC) 03/16/2022   Acute on chronic diastolic CHF (congestive heart failure) ([place]) 03/16/2022   PSVT (paroxysmal supraventricular tachycardia) 03/16/2022   AKI (acute kidney injury) ([place]) 03/15/2022   Critical limb ischemia of left lower extremity with ulceration of lower leg ([place]) 03/15/2022   Arterial occlusion 03/11/2022   Elevated troponin    Unstable angina ([place]) 01/05/2021   Hyperlipidemia LDL goal <70    CKD (chronic kidney disease), stage III (HCC)    Coronary artery disease    NSTEMI (non-ST elevated myocardial infarction) ([place]) 12/23/2020   Acquired hypothyroidism 04/20/2020   Chemotherapy-induced neuropathy ([place]) 04/20/2020   Nonthrombocytopenic purpura (  [place]) 12/03/2019   Hypercoagulable state ([place]) 12/10/2018   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 pylori) infection 09/04/2017   Medicare annual wellness visit, initial 03/31/2017   History of prostate cancer 10/27/2014   Lower urinary tract symptoms (LUTS) 03/18/2012    Nephrolithiasis 03/18/2012    PCP: Rusty Aus, MD  REFERRING PROVIDER: Kris Hartmann, NP  REFERRING DIAG: Left below knee amputee  THERAPY DIAG:  Gait difficulty  Muscle weakness (generalized)  Left below-knee amputee [facility])  Rationale for Evaluation and Treatment Rehabilitation  ONSET DATE: 03/16/22  SUBJECTIVE:   SUBJECTIVE STATEMENT:   EVALUATION Pt. S/p R BKA after blood clots in lower leg.  Pt. Reports minimum phantom limb symptoms and occasional muscle spasms.  Pt. Takes Baclofen to manage symptoms.  Pt. Received prosthetic leg on Wednesday from [facility].  Pt. Has h/o low back pain and has received spinal epidurals in past (2 years ago).    PERTINENT HISTORY: 1. Hx of BKA, left [facility]) Phillip [name] was seen for further evaluation for left below knee prosthesis.  The is a 77 year old male who had amputation on 03/16/2022.  At the time he is well healed and ready for fitting of new below knee prosthesis.  He is a highly motivated individual and should do well once fitted with prosthesis.  She has no problem returning to a K2 ambulator, which will allow him to walk inside and outside of his home and overload level barriers.    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  He will be having a bone marrow biopsy.  We will continue to have the patient keep his IVC filter for now as there may be further test.  We will have the patient return in 3 months to discuss filter removal.   PAIN:  Are you having pain? No  PRECAUTIONS: None  WEIGHT BEARING RESTRICTIONS No  FALLS:  Has patient fallen in last 6 months? No  LIVING ENVIRONMENT: Lives with: lives with their spouse Lives in: House/apartment Stairs: Yes: Internal: 13+ steps; on right going up Has following equipment at home: Single point cane and Walker - 2 wheeled  OCCUPATION: Retired  PLOF: [place] with walking and return to  playing golf by spring.     OBJECTIVE:   PATIENT SURVEYS:  FOTO initial 40/ goal 38.    COGNITION:  Overall cognitive status: Within functional limits for tasks assessed     SENSATION: WFL  EDEMA:  Circumferential: L/R knee joint line (34/36 cm.), calf (30.5/ 33 cm), distal quad (35/36 cm).    POSTURE: rounded shoulders and forward head  PALPATION: No tenderness/ good incision healing on residual limb  LOWER EXTREMITY ROM:  B LE WFL (all planes).  L knee 0-130 deg.   LOWER EXTREMITY MMT:  R LE muscle strength grossly 5/5 MMT except hip flexion 4+/5 MMT.  L LE strength grossly 5/5 MMT except hip flexion 4/5 MMT and hip abduction 4+/5 MMT.    FUNCTIONAL TESTS:  5 times sit to stand: 14.1 sec. And requires R UE assist.  Able to stand from chair with addition of 2" Airex pad.    GAIT: Distance walked: in clinic/ //-bars Assistive device utilized: Environmental consultant - 2 wheeled Level of assistance: Min A Comments: Ambulates in clinic with RW and min. A/ cuing for proper technique and step pattern.  Pt. Able to ambulate in //-bars with R UE assist only and 2-point gait pattern.    11/27:  5xSTS: 13.04 sec. (No UE assist).  1/11: 5xSTS: 9.47 sec./ 8.19 sec.  R LE muscle strength grossly 5/5 MMT except hip flexion 4+/5 MMT.  L LE strength grossly 5/5 MMT except hip flexion 4/5 MMT and hip abduction 4+/5 MMT.  No significant changes to hip flexor strength noted.    TODAY'S TREATMENT:  10/27/22:  Subjective:  Pt. Reports no pain when entering the PT clinic this morning and no new complaints.  Pt. Was able to go hit golf balls the last few days between appointments and had no LOB or pain. Pt. States he still has some issues with coordination trying to adjust to swinging with the prosthetic. Pt. States he has been increasing his endurance/strength at the gym with no pain or LOB.   There.ex.:  Nustep L6 10 min. Seat 10. B LE only to increase LE endurance.   Neuro:  Rebounder: NBOS/  alternating/ tandem stance/ Airex pad with 4# blue ball.  SBA/CGA for safety.    Cone taps standing on Airex with PT verbally cuing which foot to use and what color to tap to challenge pt. coordination. 25x taps in //-bars with SBA/CGA.    Agility course in hallway to challenge pt.'s dynamic stability. Course included: airex pads, cone taps, 6" hurdles, cone weaving. 3 laps(down/back).   Agility ladder: coordination task to decrease fall risk and increase balance. 2 laps(down/back).   PATIENT EDUCATION:  Education details: Reviewed HEP Person educated: Patient Education method: Explanation, Demonstration, and Handouts Education comprehension: verbalized understanding and returned demonstration   HOME EXERCISE PROGRAM: See handouts.   ASSESSMENT:  CLINICAL IMPRESSION: Patient is highly motivated and hard working during standing/ walking tasks today. Pt. Is continuing to advance his dynamic stability. Pt. Has good prognosis to continue progressing to his hobbies/goals of returning to light golfing. Pt. Has difficulty with tasks involving turning and other dynamic maneuvers at agility ladder. Pt. Requires cuing to maintain correct posture when performing activities to avoid swaying of the hips and compensation. Pt. Continues to have significant difficulty maintaining balance with activities involving one leg stance activities (L>R). Pt. will benefit from skilled PT services to increase LE muscle strength to improve independence with ADL's/walking/return to playing golf.      OBJECTIVE IMPAIRMENTS Abnormal gait, decreased activity tolerance, decreased balance, decreased endurance, decreased mobility, difficulty walking, decreased strength, impaired flexibility, improper body mechanics, postural dysfunction, and pain.   ACTIVITY LIMITATIONS carrying, lifting, bending, standing, squatting, stairs, transfers, toileting, dressing, and locomotion level  PARTICIPATION LIMITATIONS: driving, community  activity, and yard work  PERSONAL FACTORS Fitness and Past/current experiences are also affecting patient's functional outcome.   REHAB POTENTIAL: Good  CLINICAL DECISION MAKING: Stable/uncomplicated  EVALUATION COMPLEXITY: Low   GOALS: Goals reviewed with patient? Yes  SHORT TERM GOALS: Target date: 09/27/22 Pt. Independent with HEP to increase B hip strength 1/2 muscle grade to improve standing/walking mod. Independence.   Baseline:  see above.  L hip flexion 4/5 MMT.  Goal status: Goal met    LONG TERM GOALS: Target date: 11/24/22  Pt. Will increase FOTO to 59 to improve functional  mobility.   Baseline:  initial FOTO 40.  11/8: 68 Goal status: Goal met  2.  Pt. Able to ascend/descend 13 stairs with R handrail and consistent step pattern to improve pts. Ability to go to 2nd floor of home.  11/8: recip. Gait on stairs outside with R handrail for safety.   Baseline:  Goal status: Goal met  3.  Pt. Will ambulate with mod. I and SPC with consistent 2-point gait pattern and no loss of balance to improve independence at home/ community.   Baseline:  11/8: pt. Ambulates community distances Goal status: Goal met  4.  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    Baseline:  pt. Requires extra time/ SPC while walking outside and limited with carrying  Goal status: Initial  5.  Pt. Able to return to playing round of golf with no limitations safely.    Baseline:  pt. Demonstrates chipping golf balls in gym with SBA/CGA  Goal status: Initial    PLAN: PT FREQUENCY: 2x/week  PT DURATION: 4 weeks  PLANNED INTERVENTIONS: Therapeutic exercises, Therapeutic activity, Neuromuscular re-education, Balance training, Gait training, Patient/Family education, Self Care, Joint mobilization, Stair training, Prosthetic training, Manual therapy, and Re-evaluation  PLAN FOR NEXT SESSION: Progress gait to more skilled dynamic tasks.  Simulate golfing.    Esterlene Atiyeh B. Rogers Blocker, SPT Pura Spice, PT, DPT # [phone] 10/27/2022, 12:47 PM

## 2022-10-29 ENCOUNTER — Encounter: Payer: Self-pay | Admitting: Oncology

## 2022-10-31 ENCOUNTER — Telehealth (INDEPENDENT_AMBULATORY_CARE_PROVIDER_SITE_OTHER): Payer: Self-pay

## 2022-10-31 ENCOUNTER — Ambulatory Visit: Payer: HMO | Admitting: Physical Therapy

## 2022-10-31 ENCOUNTER — Encounter: Payer: Self-pay | Admitting: Physical Therapy

## 2022-10-31 DIAGNOSIS — M6281 Muscle weakness (generalized): Secondary | ICD-10-CM

## 2022-10-31 DIAGNOSIS — Z89512 Acquired absence of left leg below knee: Secondary | ICD-10-CM

## 2022-10-31 DIAGNOSIS — R269 Unspecified abnormalities of gait and mobility: Secondary | ICD-10-CM | POA: Diagnosis not present

## 2022-10-31 NOTE — Telephone Encounter (Signed)
Patient is questioning when he should stop eliquis for his procedure.

## 2022-10-31 NOTE — Telephone Encounter (Signed)
Spoke with the patient regarding his Eliquis and to not take it the morning of his procedure.

## 2022-10-31 NOTE — Therapy (Signed)
OUTPATIENT PHYSICAL THERAPY LOWER EXTREMITY TREATMENT  Patient Name: Phillip [name] MRN: [id number] DOB:11-22-1945, 77 y.o., male Today's Date: 10/31/2022    PT End of Session - 10/31/22 1343     Visit Number 28    Number of Visits 35    Date for PT Re-Evaluation 11/24/22    PT Start Time 1341    PT Stop Time 1430    PT Time Calculation (min) 49 min    Equipment Utilized During Treatment Gait belt    Activity Tolerance Patient tolerated treatment well    Behavior During Therapy WFL for tasks assessed/performed             Past Medical History:  Diagnosis Date   Arthritis    Basal cell carcinoma    L clavicle, L prox forearm- removed years ago    Bladder cancer ([place])    Bladder neck contracture    CAD (coronary artery disease)    a. 12/2020 NSTEMI/Cath: LM nl, LAD 95ost/p, LCX 50p, RCA nl. EF 45-50%.   Cataract    CKD (chronic kidney disease), stage III [facility])    ED (erectile dysfunction)    Frequency    GERD (gastroesophageal reflux disease)    History of pulmonary embolus (PE) 11/2018   a. Following LE DVT-->Chronic warfarin.   Hyperlipidemia LDL goal <70    Hypertension    Hypothyroidism    Incontinence of urine    sui, s/p cryoablation   Ischemic cardiomyopathy    a. 11/2018 Echo: EF 60-65%; b. 12/2020 LV Gram: EF 45-50% in setting of NSTEMI.   Kidney stones    Neuropathy    feet   Nocturia    Prostate cancer ([place])    S/P   CRYOABLATION   Right elbow tendinitis    Right Lower Extremity DVT (deep venous thrombosis) ([place]) 11/25/2018   Vertigo    1-2x/yr   Wears glasses    Past Surgical History:  Procedure Laterality Date   AMPUTATION Left 03/16/2022   Procedure: AMPUTATION BELOW KNEE;  Surgeon: Katha Cabal, MD;  Location: [facility];  Service: Vascular;  Laterality: Left;   ARTHROPLASTY AND TENDON REPAIR LEFT THUMB  JAN 2014   CATARACT EXTRACTION W/PHACO Left 11/16/2020   Procedure: CATARACT EXTRACTION PHACO AND INTRAOCULAR LENS PLACEMENT (IOC) LEFT   7.09 00:56.4 12.6%;  Surgeon: Leandrew Koyanagi, MD;  Location: [place];  Service: Ophthalmology;  Laterality: Left;   CATARACT EXTRACTION W/PHACO Right 12/02/2020   Procedure: CATARACT EXTRACTION PHACO AND INTRAOCULAR LENS PLACEMENT (IOC) RIGHT MALYUGIN 5.52 00:49.7 11.1%;  Surgeon: Leandrew Koyanagi, MD;  Location: [place];  Service: Ophthalmology;  Laterality: Right;   COLONOSCOPY     COLONOSCOPY WITH PROPOFOL N/A 08/09/2017   Procedure: COLONOSCOPY WITH PROPOFOL;  Surgeon: Manya Silvas, MD;  Location: [facility]COPY;  Service: Endoscopy;  Laterality: N/A;   CORONARY ANGIOGRAPHY N/A 12/28/2020   Procedure: CORONARY ANGIOGRAPHY;  Surgeon: Sherren Mocha, MD;  Location: [place] CV LAB;  Service: Cardiovascular;  Laterality: N/A;   CORONARY STENT INTERVENTION N/A 12/28/2020   Procedure: CORONARY STENT INTERVENTION;  Surgeon: Sherren Mocha, MD;  Location: [place] [facility] LAB;  Service: Cardiovascular;  Laterality: N/A;   ESOPHAGOGASTRODUODENOSCOPY (EGD) WITH PROPOFOL N/A 08/09/2017   Procedure: ESOPHAGOGASTRODUODENOSCOPY (EGD) WITH PROPOFOL;  Surgeon: Manya Silvas, MD;  Location: [facility]COPY;  Service: Endoscopy;  Laterality: N/A;   GREEN LIGHT LASER TURP (TRANSURETHRAL RESECTION OF PROSTATE N/A 12/14/2015   Procedure: GREEN LIGHT LASER TURP (TRANSURETHRAL RESECTION OF PROSTATE)  LASER OF BLADDER NECK CONTRACTURE;  Surgeon: Carolan Clines, MD;  Location: [facility];  Service: Urology;  Laterality: N/A;   IVC FILTER INSERTION N/A 03/19/2022   Procedure: IVC FILTER INSERTION;  Surgeon: Serafina Mitchell, MD;  Location: [place] [facility] LAB;  Service: Cardiovascular;  Laterality: N/A;   LEFT HEART CATH AND CORONARY ANGIOGRAPHY N/A 12/24/2020   Procedure: LEFT HEART CATH AND CORONARY ANGIOGRAPHY and possible PCI and stent;  Surgeon: Yolonda Kida, MD;  Location: [place] CV LAB;  Service: Cardiovascular;  Laterality: N/A;   LEFT HEART  CATH AND CORONARY ANGIOGRAPHY N/A 01/06/2021   Procedure: LEFT HEART CATH AND CORONARY ANGIOGRAPHY;  Surgeon: Troy Sine, MD;  Location: [place] [facility] LAB;  Service: Cardiovascular;  Laterality: N/A;   LOWER EXTREMITY ANGIOGRAPHY Left 03/11/2022   Procedure: Lower Extremity Angiography;  Surgeon: Katha Cabal, MD;  Location: [place] CV LAB;  Service: Cardiovascular;  Laterality: Left;   PENILE PROSTHESIS IMPLANT N/A 12/06/2013   Procedure: PENILE PROTHESIS INFLATABLE;  Surgeon: Ailene Rud, MD;  Location: [facility];  Service: Urology;  Laterality: N/A;   PROSTATE CRYOABLATION  06-01-2012  [facility]   PULMONARY THROMBECTOMY N/A 03/18/2022   Procedure: PULMONARY THROMBECTOMY;  Surgeon: Algernon Huxley, MD;  Location: [place] CV LAB;  Service: Cardiovascular;  Laterality: N/A;   PULMONARY THROMBECTOMY Bilateral 03/23/2022   Procedure: PULMONARY THROMBECTOMY;  Surgeon: Katha Cabal, MD;  Location: [place] CV LAB;  Service: Cardiovascular;  Laterality: Bilateral;   THROMBECTOMY ILIAC ARTERY Left 03/12/2022   Procedure: THROMBECTOMY LEG;  Surgeon: Conrad [place], MD;  Location: [facility];  Service: Vascular;  Laterality: Left;   TRANSURETHRAL RESECTION OF BLADDER NECK N/A 03/20/2015   Procedure: RELEASE BLADDER NECK CONTRACTURE  WITH WOLF BUTTON ELECTRODE ;  Surgeon: Carolan Clines, MD;  Location: [place];  Service: Urology;  Laterality: N/A;   TRANSURETHRAL RESECTION OF BLADDER TUMOR N/A 12/05/2018   Procedure: TRANSURETHRAL RESECTION OF BLADDER TUMOR (TURBT)/CYSTOSCOPY/  INSTILLATION OF Rodell Perna;  Surgeon: Ceasar Mons, MD;  Location: [facility];  Service: Urology;  Laterality: N/A;   TRANSURETHRAL RESECTION OF BLADDER TUMOR N/A 01/15/2020   Procedure: TRANSURETHRAL RESECTION OF BLADDER TUMOR (TURBT)/ CYSTOSCOPY;  Surgeon: Ceasar Mons, MD;  Location: [facility];  Service:  Urology;  Laterality: N/A;   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     Patient Active Problem List   Diagnosis Date Noted   Iron deficiency anemia 06/20/2022   Myeloproliferative disorder ([place]) 05/19/2022   History of bladder cancer 05/19/2022   Goals of care, counseling/discussion 03/23/2022   Reactive thrombocytosis 03/21/2022   Paroxysmal atrial fibrillation with RVR ([place]) 03/20/2022   Acute bilateral deep vein thrombosis (DVT) of femoral veins (HCC) 03/20/2022   Acute respiratory failure with hypoxia (HCC) 03/16/2022   Acute on chronic diastolic CHF (congestive heart failure) ([place]) 03/16/2022   PSVT (paroxysmal supraventricular tachycardia) 03/16/2022   AKI (acute kidney injury) ([place]) 03/15/2022   Critical limb ischemia of left lower extremity with ulceration of lower leg ([place]) 03/15/2022   Arterial occlusion 03/11/2022   Elevated troponin    Unstable angina ([place]) 01/05/2021   Hyperlipidemia LDL goal <70    CKD (chronic kidney disease), stage III (HCC)    Coronary artery disease    NSTEMI (non-ST elevated myocardial infarction) ([place]) 12/23/2020   Acquired hypothyroidism 04/20/2020   Chemotherapy-induced neuropathy ([place]) 04/20/2020   Nonthrombocytopenic purpura ([place])  12/03/2019   Hypercoagulable state ([place]) 12/10/2018   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 pylori) infection 09/04/2017   Medicare annual wellness visit, initial 03/31/2017   History of prostate cancer 10/27/2014   Lower urinary tract symptoms (LUTS) 03/18/2012   Nephrolithiasis 03/18/2012    PCP:  Rusty Aus, MD  REFERRING PROVIDER: Kris Hartmann, NP  REFERRING DIAG: Left below knee amputee  THERAPY DIAG:  Gait difficulty  Muscle weakness (generalized)  Left below-knee amputee [facility])  Rationale for Evaluation and Treatment Rehabilitation  ONSET DATE: 03/16/22  SUBJECTIVE:   SUBJECTIVE STATEMENT:   EVALUATION Pt. S/p R BKA after blood clots in lower leg.  Pt. Reports minimum phantom limb symptoms and occasional muscle spasms.  Pt. Takes Baclofen to manage symptoms.  Pt. Received prosthetic leg on Wednesday from [facility].  Pt. Has h/o low back pain and has received spinal epidurals in past (2 years ago).    PERTINENT HISTORY: 1. Hx of BKA, left [facility]) Mr. Sanjiv Petit was seen for further evaluation for left below knee prosthesis.  The is a 77 year old male who had amputation on 03/16/2022.  At the time he is well healed and ready for fitting of new below knee prosthesis.  He is a highly motivated individual and should do well once fitted with prosthesis.  She has no problem returning to a K2 ambulator, which will allow him to walk inside and outside of his home and overload level barriers.    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  He will be having a bone marrow biopsy.  We will continue to have the patient keep his IVC filter for now as there may be further test.  We will have the patient return in 3 months to discuss filter removal.   PAIN:  Are you having pain? No  PRECAUTIONS: None  WEIGHT BEARING RESTRICTIONS No  FALLS:  Has patient fallen in last 6 months? No  LIVING ENVIRONMENT: Lives with: lives with their spouse Lives in: House/apartment Stairs: Yes: Internal: 13+ steps; on right going up Has following equipment at home: Single point cane and Walker - 2 wheeled  OCCUPATION: Retired  PLOF: [place] with walking and return to playing golf by spring.      OBJECTIVE:   PATIENT SURVEYS:  FOTO initial 40/ goal 69.    COGNITION:  Overall cognitive status: Within functional limits for tasks assessed     SENSATION: WFL  EDEMA:  Circumferential: L/R knee joint line (34/36 cm.), calf (30.5/ 33 cm), distal quad (35/36 cm).    POSTURE: rounded shoulders and forward head  PALPATION: No tenderness/ good incision healing on residual limb  LOWER EXTREMITY ROM:  B LE WFL (all planes).  L knee 0-130 deg.   LOWER EXTREMITY MMT:  R LE muscle strength grossly 5/5 MMT except hip flexion 4+/5 MMT.  L LE strength grossly 5/5 MMT except hip flexion 4/5 MMT and hip abduction 4+/5 MMT.    FUNCTIONAL TESTS:  5 times sit to stand: 14.1 sec. And requires R UE assist.  Able  to stand from chair with addition of 2" Airex pad.    GAIT: Distance walked: in clinic/ //-bars Assistive device utilized: Environmental consultant - 2 wheeled Level of assistance: Min A Comments: Ambulates in clinic with RW and min. A/ cuing for proper technique and step pattern.  Pt. Able to ambulate in //-bars with R UE assist only and 2-point gait pattern.    11/27:  5xSTS: 13.04 sec. (No UE assist).  1/11: 5xSTS: 9.47 sec./ 8.19 sec.  R LE muscle strength grossly 5/5 MMT except hip flexion 4+/5 MMT.  L LE strength grossly 5/5 MMT except hip flexion 4/5 MMT and hip abduction 4+/5 MMT.  No significant changes to hip flexor strength noted.   TODAY'S TREATMENT:  10/31/22:  Subjective:  Pt. Reports no pain when entering the PT clinic and was able to go hit golf balls this weekend.  Pt. States he did better hitting his driver and reports better balance.  Pt. Has a medical procedure tomorrow and has to stop taking Eliquis.    There.ex.:  Nustep L6 10 min. Seat 10. B LE only to increase LE endurance.   Lateral walking with BTB (above knee) at agility ladder (cuing to correct upright posture)- 3x.    Airex partial squats at TM 15x.    Neuro:  Agility course in hallway to challenge pt.'s  dynamic stability. Course included: airex pads, cone taps, 6" hurdles, cone weaving. 3 laps(down/back).   Agility ladder: coordination task to decrease fall risk and increase balance. 4 laps (down/back)- forward and lateral.     PATIENT EDUCATION:  Education details: Reviewed HEP Person educated: Patient Education method: Explanation, Demonstration, and Handouts Education comprehension: verbalized understanding and returned demonstration   HOME EXERCISE PROGRAM: See handouts.   ASSESSMENT:  CLINICAL IMPRESSION: Patient is highly motivated and hard working during standing/ walking tasks today. Pt. Is continuing to advance his dynamic stability. Pt. Has good prognosis to continue progressing to his hobbies/goals of returning to light golfing. Pt. Completes agility ladder with decrease cuing and only SBA for safety.  Pt. Requires cuing to maintain correct posture when performing activities to avoid swaying of the hips and compensation. Pt. Continues to have significant difficulty maintaining balance with activities involving one leg stance activities (L>R). Pt. will benefit from skilled PT services to increase LE muscle strength to improve independence with ADL's/walking/return to playing golf.      OBJECTIVE IMPAIRMENTS Abnormal gait, decreased activity tolerance, decreased balance, decreased endurance, decreased mobility, difficulty walking, decreased strength, impaired flexibility, improper body mechanics, postural dysfunction, and pain.   ACTIVITY LIMITATIONS carrying, lifting, bending, standing, squatting, stairs, transfers, toileting, dressing, and locomotion level  PARTICIPATION LIMITATIONS: driving, community activity, and yard work  PERSONAL FACTORS Fitness and Past/current experiences are also affecting patient's functional outcome.   REHAB POTENTIAL: Good  CLINICAL DECISION MAKING: Stable/uncomplicated  EVALUATION COMPLEXITY: Low   GOALS: Goals reviewed with patient?  Yes  SHORT TERM GOALS: Target date: 09/27/22 Pt. Independent with HEP to increase B hip strength 1/2 muscle grade to improve standing/walking mod. Independence.   Baseline:  see above.  L hip flexion 4/5 MMT.  Goal status: Goal met    LONG TERM GOALS: Target date: 11/24/22  Pt. Will increase FOTO to 59 to improve functional mobility.   Baseline:  initial FOTO 40.  11/8: 68 Goal status: Goal met  2.  Pt. Able to ascend/descend 13 stairs with R handrail and consistent step pattern to improve pts. Ability to go to 2nd floor of home.  11/8: recip. Gait on stairs outside with R handrail for safety.   Baseline:  Goal status: Goal met  3.  Pt. Will ambulate with mod. I and SPC with consistent 2-point gait pattern and no loss of balance to improve independence at home/ community.   Baseline:  11/8: pt. Ambulates community distances Goal status: Goal met  4.  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    Baseline:  pt. Requires extra time/ SPC while walking outside and limited with carrying  Goal status: Initial  5.  Pt. Able to return to playing round of golf with no limitations safely.    Baseline:  pt. Demonstrates chipping golf balls in gym with SBA/CGA  Goal status: Initial   PLAN: PT FREQUENCY: 2x/week  PT DURATION: 4 weeks  PLANNED INTERVENTIONS: Therapeutic exercises, Therapeutic activity, Neuromuscular re-education, Balance training, Gait training, Patient/Family education, Self Care, Joint mobilization, Stair training, Prosthetic training, Manual therapy, and Re-evaluation  PLAN FOR NEXT SESSION: Progress gait to more skilled dynamic tasks.  Simulate golfing.   Ashlyn B. Rogers Blocker, SPT Pura Spice, PT, DPT # [phone] 10/31/2022, 2:53 PM

## 2022-11-01 ENCOUNTER — Other Ambulatory Visit: Payer: Self-pay

## 2022-11-01 ENCOUNTER — Ambulatory Visit
Admission: RE | Admit: 2022-11-01 | Discharge: 2022-11-01 | Disposition: A | Discharge status: Home / Self Care | Payer: HMO | Attending: Vascular Surgery | Admitting: Vascular Surgery

## 2022-11-01 ENCOUNTER — Encounter: Payer: Self-pay | Admitting: Vascular Surgery

## 2022-11-01 ENCOUNTER — Encounter: Payer: Medicare HMO | Admitting: Physical Therapy

## 2022-11-01 ENCOUNTER — Encounter
Admission: RE | Disposition: A | Discharge status: Home / Self Care | Payer: Self-pay | Source: Home / Self Care | Attending: Vascular Surgery

## 2022-11-01 DIAGNOSIS — Z86711 Personal history of pulmonary embolism: Secondary | ICD-10-CM | POA: Diagnosis not present

## 2022-11-01 DIAGNOSIS — Z7901 Long term (current) use of anticoagulants: Secondary | ICD-10-CM | POA: Diagnosis not present

## 2022-11-01 DIAGNOSIS — Z4589 Encounter for adjustment and management of other implanted devices: Secondary | ICD-10-CM | POA: Diagnosis not present

## 2022-11-01 DIAGNOSIS — N183 Chronic kidney disease, stage 3 unspecified: Secondary | ICD-10-CM | POA: Diagnosis not present

## 2022-11-01 DIAGNOSIS — I82409 Acute embolism and thrombosis of unspecified deep veins of unspecified lower extremity: Secondary | ICD-10-CM

## 2022-11-01 DIAGNOSIS — I129 Hypertensive chronic kidney disease with stage 1 through stage 4 chronic kidney disease, or unspecified chronic kidney disease: Secondary | ICD-10-CM | POA: Diagnosis not present

## 2022-11-01 DIAGNOSIS — I2699 Other pulmonary embolism without acute cor pulmonale: Secondary | ICD-10-CM | POA: Diagnosis not present

## 2022-11-01 DIAGNOSIS — Z89512 Acquired absence of left leg below knee: Secondary | ICD-10-CM | POA: Insufficient documentation

## 2022-11-01 DIAGNOSIS — Z86718 Personal history of other venous thrombosis and embolism: Secondary | ICD-10-CM | POA: Insufficient documentation

## 2022-11-01 HISTORY — PX: IVC FILTER REMOVAL: CATH118246

## 2022-11-01 SURGERY — IVC FILTER REMOVAL
Anesthesia: Moderate Sedation

## 2022-11-01 MED ORDER — FENTANYL CITRATE PF 50 MCG/ML IJ SOSY
PREFILLED_SYRINGE | INTRAMUSCULAR | Status: AC
  Filled 2022-11-01: qty 1

## 2022-11-01 MED ORDER — LABETALOL HCL 5 MG/ML IV SOLN
INTRAVENOUS | Status: DC | PRN
  Administered 2022-11-01: 10 mg via INTRAVENOUS

## 2022-11-01 MED ORDER — LABETALOL HCL 5 MG/ML IV SOLN
INTRAVENOUS | Status: AC
  Filled 2022-11-01: qty 4

## 2022-11-01 MED ORDER — HYDROMORPHONE HCL 1 MG/ML IJ SOLN: 1.0000 mg | Freq: Once | INTRAMUSCULAR | Status: DC | PRN

## 2022-11-01 MED ORDER — FAMOTIDINE 20 MG PO TABS: 40.0000 mg | ORAL_TABLET | Freq: Once | ORAL | Status: DC | PRN

## 2022-11-01 MED ORDER — MIDAZOLAM HCL 2 MG/ML PO SYRP: 8.0000 mg | ORAL_SOLUTION | Freq: Once | ORAL | Status: DC | PRN

## 2022-11-01 MED ORDER — DIPHENHYDRAMINE HCL 50 MG/ML IJ SOLN: 50.0000 mg | Freq: Once | INTRAMUSCULAR | Status: DC | PRN

## 2022-11-01 MED ORDER — FENTANYL CITRATE (PF) 100 MCG/2ML IJ SOLN
INTRAMUSCULAR | Status: DC | PRN
  Administered 2022-11-01: 50 ug via INTRAVENOUS
  Administered 2022-11-01: 25 ug via INTRAVENOUS

## 2022-11-01 MED ORDER — MIDAZOLAM HCL 2 MG/2ML IJ SOLN
INTRAMUSCULAR | Status: AC
  Filled 2022-11-01: qty 2

## 2022-11-01 MED ORDER — IODIXANOL 320 MG/ML IV SOLN
INTRAVENOUS | Status: DC | PRN
  Administered 2022-11-01: 20 mL via INTRAVENOUS

## 2022-11-01 MED ORDER — MIDAZOLAM HCL 2 MG/2ML IJ SOLN
INTRAMUSCULAR | Status: DC | PRN
  Administered 2022-11-01 (×2): 1 mg via INTRAVENOUS
  Administered 2022-11-01: .5 mg via INTRAVENOUS

## 2022-11-01 MED ORDER — CEFAZOLIN SODIUM-DEXTROSE 2-4 GM/100ML-% IV SOLN
INTRAVENOUS | Status: AC
  Administered 2022-11-01: 2 g via INTRAVENOUS
  Filled 2022-11-01: qty 100

## 2022-11-01 MED ORDER — ONDANSETRON HCL 4 MG/2ML IJ SOLN: 4.0000 mg | Freq: Four times a day (QID) | INTRAMUSCULAR | Status: DC | PRN

## 2022-11-01 MED ORDER — METHYLPREDNISOLONE SODIUM SUCC 125 MG IJ SOLR: 125.0000 mg | Freq: Once | INTRAMUSCULAR | Status: DC | PRN

## 2022-11-01 MED ORDER — CEFAZOLIN SODIUM-DEXTROSE 2-4 GM/100ML-% IV SOLN: 2.0000 g | INTRAVENOUS | Status: AC

## 2022-11-01 MED ORDER — SODIUM CHLORIDE 0.9 % IV SOLN: INTRAVENOUS | Status: DC

## 2022-11-01 SURGICAL SUPPLY — 7 items
COVER PROBE ULTRASOUND 5X96 (MISCELLANEOUS) IMPLANT
NDL ENTRY 21GA 7CM ECHOTIP (NEEDLE) IMPLANT
NEEDLE ENTRY 21GA 7CM ECHOTIP (NEEDLE) ×1 IMPLANT
PACK ANGIOGRAPHY (CUSTOM PROCEDURE TRAY) ×1 IMPLANT
SET CLOVERSNARE FLT RETRIEVAL (MISCELLANEOUS) IMPLANT
SET INTRO CAPELLA COAXIAL (SET/KITS/TRAYS/PACK) IMPLANT
WIRE GUIDERIGHT .035X150 (WIRE) IMPLANT

## 2022-11-01 NOTE — Significant Event (Signed)
3 EKGs conducted for pt's cardiology team to review spontaneous ST and NSR

## 2022-11-01 NOTE — H&P (View-Only) (Signed)
MRN : [id number]  Phillip [name] is a 77 y.o. (1945-12-25) male who presents with chief complaint of legs hurt and swell.  History of Present Illness:   Patient presents to [facility] for retrieval of previously placed IVC filter.  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 Shortly following his amputation the patient developed bilateral DVTs with subsequent pulmonary embolism with pulmonary thrombectomy on 03/18/2022. Due to the clot burden he underwent IVC placement on 03/19/2022. The patient subsequently had a worsening clot development and underwent additional pulmonary thrombectomy on 03/23/2022.  At this point he is ambulating with his prosthesis and has completed his physical therapy.  He has also completed his initial 6 months of anticoagulation without any hemorrhagic complications.  There is therefore appropriate to take his IVC filter out while continuing his anticoagulation.  No outpatient medications have been marked as taking for the 11/01/22 encounter [facility] Encounter).    Past Medical History:  Diagnosis Date   Arthritis    Basal cell carcinoma    L clavicle, L prox forearm- removed years ago    Bladder cancer (HCC)    Bladder neck contracture    CAD (coronary artery disease)    a. 12/2020 NSTEMI/Cath: LM nl, LAD 95ost/p, LCX 50p, RCA nl. EF 45-50%.   Cataract    CKD (chronic kidney disease), stage III [facility])    ED (erectile dysfunction)    Frequency    GERD (gastroesophageal reflux disease)    History of pulmonary embolus (PE) 11/2018   a. Following LE DVT-->Chronic warfarin.   Hyperlipidemia LDL goal <70    Hypertension    Hypothyroidism    Incontinence of urine    sui, s/p cryoablation   Ischemic cardiomyopathy    a. 11/2018  Echo: EF 60-65%; b. 12/2020 LV Gram: EF 45-50% in setting of NSTEMI.   Kidney stones    Neuropathy    feet   Nocturia    Prostate cancer ([place])    S/P   CRYOABLATION   Right elbow tendinitis    Right Lower Extremity DVT (deep venous thrombosis) ([place]) 11/25/2018   Vertigo    1-2x/yr   Wears glasses     Past Surgical History:  Procedure Laterality Date   AMPUTATION Left 03/16/2022   Procedure: AMPUTATION BELOW KNEE;  Surgeon: Katha Cabal, MD;  Location: [facility] ORS;  Service: Vascular;  Laterality: Left;   ARTHROPLASTY AND TENDON REPAIR LEFT THUMB  JAN 2014   CATARACT EXTRACTION W/PHACO Left 11/16/2020   Procedure: CATARACT EXTRACTION PHACO AND INTRAOCULAR LENS PLACEMENT (IOC) LEFT  7.09 00:56.4 12.6%;  Surgeon: Leandrew Koyanagi, MD;  Location: [place];  Service: Ophthalmology;  Laterality: Left;   CATARACT EXTRACTION W/PHACO Right 12/02/2020   Procedure: CATARACT EXTRACTION PHACO AND INTRAOCULAR LENS PLACEMENT (IOC) RIGHT MALYUGIN 5.52 00:49.7 11.1%;  Surgeon: Leandrew Koyanagi, MD;  Location: [name];  Service: Ophthalmology;  Laterality: Right;   COLONOSCOPY     COLONOSCOPY WITH PROPOFOL N/A 08/09/2017   Procedure: COLONOSCOPY WITH PROPOFOL;  Surgeon: Manya Silvas, MD;  Location: [facility] ENDOSCOPY;  Service: Endoscopy;  Laterality: N/A;   CORONARY ANGIOGRAPHY N/A 12/28/2020   Procedure: CORONARY ANGIOGRAPHY;  Surgeon: Sherren Mocha, MD;  Location: [place] CV LAB;  Service: Cardiovascular;  Laterality: N/A;  CORONARY STENT INTERVENTION N/A 12/28/2020   Procedure: CORONARY STENT INTERVENTION;  Surgeon: Sherren Mocha, MD;  Location: [place] CV LAB;  Service: Cardiovascular;  Laterality: N/A;   ESOPHAGOGASTRODUODENOSCOPY (EGD) WITH PROPOFOL N/A 08/09/2017   Procedure: ESOPHAGOGASTRODUODENOSCOPY (EGD) WITH PROPOFOL;  Surgeon: Manya Silvas, MD;  Location: [facility] ENDOSCOPY;  Service: Endoscopy;  Laterality: N/A;   GREEN LIGHT LASER TURP  (TRANSURETHRAL RESECTION OF PROSTATE N/A 12/14/2015   Procedure: GREEN LIGHT LASER TURP (TRANSURETHRAL RESECTION OF PROSTATE) LASER OF BLADDER NECK CONTRACTURE;  Surgeon: Carolan Clines, MD;  Location: [place];  Service: Urology;  Laterality: N/A;   IVC FILTER INSERTION N/A 03/19/2022   Procedure: IVC FILTER INSERTION;  Surgeon: Serafina Mitchell, MD;  Location: [place] CV LAB;  Service: Cardiovascular;  Laterality: N/A;   LEFT HEART CATH AND CORONARY ANGIOGRAPHY N/A 12/24/2020   Procedure: LEFT HEART CATH AND CORONARY ANGIOGRAPHY and possible PCI and stent;  Surgeon: Yolonda Kida, MD;  Location: [place] CV LAB;  Service: Cardiovascular;  Laterality: N/A;   LEFT HEART CATH AND CORONARY ANGIOGRAPHY N/A 01/06/2021   Procedure: LEFT HEART CATH AND CORONARY ANGIOGRAPHY;  Surgeon: Troy Sine, MD;  Location: [place] CV LAB;  Service: Cardiovascular;  Laterality: N/A;   LOWER EXTREMITY ANGIOGRAPHY Left 03/11/2022   Procedure: Lower Extremity Angiography;  Surgeon: Katha Cabal, MD;  Location: [place] CV LAB;  Service: Cardiovascular;  Laterality: Left;   PENILE PROSTHESIS IMPLANT N/A 12/06/2013   Procedure: PENILE PROTHESIS INFLATABLE;  Surgeon: Ailene Rud, MD;  Location: [facility];  Service: Urology;  Laterality: N/A;   PROSTATE CRYOABLATION  06-01-2012  [facility]   PULMONARY THROMBECTOMY N/A 03/18/2022   Procedure: PULMONARY THROMBECTOMY;  Surgeon: Algernon Huxley, MD;  Location: [place] CV LAB;  Service: Cardiovascular;  Laterality: N/A;   PULMONARY THROMBECTOMY Bilateral 03/23/2022   Procedure: PULMONARY THROMBECTOMY;  Surgeon: Katha Cabal, MD;  Location: [place] CV LAB;  Service: Cardiovascular;  Laterality: Bilateral;   THROMBECTOMY ILIAC ARTERY Left 03/12/2022   Procedure: THROMBECTOMY LEG;  Surgeon: Conrad Litchfield, MD;  Location: [facility];  Service: Vascular;  Laterality: Left;   TRANSURETHRAL RESECTION OF  BLADDER NECK N/A 03/20/2015   Procedure: RELEASE BLADDER NECK CONTRACTURE  WITH WOLF BUTTON ELECTRODE ;  Surgeon: Carolan Clines, MD;  Location: [place];  Service: Urology;  Laterality: N/A;   TRANSURETHRAL RESECTION OF BLADDER TUMOR N/A 12/05/2018   Procedure: TRANSURETHRAL RESECTION OF BLADDER TUMOR (TURBT)/CYSTOSCOPY/  INSTILLATION OF Rodell Perna;  Surgeon: Ceasar Mons, MD;  Location: [facility];  Service: Urology;  Laterality: N/A;   TRANSURETHRAL RESECTION OF BLADDER TUMOR N/A 01/15/2020   Procedure: TRANSURETHRAL RESECTION OF BLADDER TUMOR (TURBT)/ CYSTOSCOPY;  Surgeon: Ceasar Mons, MD;  Location: [facility];  Service: Urology;  Laterality: N/A;   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      Social History Social History   Tobacco Use   Smoking status: Never    Passive exposure: Never   Smokeless tobacco: Never  Vaping Use   Vaping Use: Never used  Substance Use Topics   Alcohol use: No   Drug use: No    Family History Family History  Problem Relation Age of Onset   CAD Father 64    Allergies  Allergen Reactions   Doxazosin Other (See Comments)    Other reaction(s): Unknown     REVIEW OF SYSTEMS (Negative unless checked)  Constitutional: []$ Weight loss  []$ Fever  []$ Chills Cardiac: []$ Chest pain   []$ Chest pressure   []$ Palpitations   []$ Shortness of breath when laying flat   []$ Shortness of breath with exertion. Vascular:  []$ Pain in legs with walking   [x]$ Pain in legs at rest  []$ History of DVT   []$ Phlebitis   [x]$ Swelling in legs   []$ Varicose veins   []$ Non-healing ulcers Pulmonary:   []$ Uses home oxygen   []$ Productive cough   []$ Hemoptysis   []$ Wheeze  []$ COPD   []$ Asthma Neurologic:  []$ Dizziness   []$ Seizures   []$ History of stroke   []$ History of TIA  []$ Aphasia   []$ Vissual changes   []$ Weakness or numbness in arm   []$ Weakness or numbness in leg Musculoskeletal:   []$ Joint swelling    []$ Joint pain   []$ Low back pain Hematologic:  []$ Easy bruising  []$ Easy bleeding   []$ Hypercoagulable state   []$ Anemic Gastrointestinal:  []$ Diarrhea   []$ Vomiting  []$ Gastroesophageal reflux/heartburn   []$ Difficulty swallowing. Genitourinary:  []$ Chronic kidney disease   []$ Difficult urination  []$ Frequent urination   []$ Blood in urine Skin:  []$ Rashes   []$ Ulcers  Psychological:  []$ History of anxiety   []$  History of major depression.  Physical Examination  There were no vitals filed for this visit. There is no height or weight on file to calculate BMI. Gen: WD/WN, NAD Head: [place]/AT, No temporalis wasting.  Ear/Nose/Throat: Hearing grossly intact, nares w/o erythema or drainage, pinna without lesions Eyes: PER, EOMI, sclera nonicteric.  Neck: Supple, no gross masses.  No JVD.  Pulmonary:  Good air movement, no audible wheezing, no use of accessory muscles.  Cardiac: RRR, precordium not hyperdynamic. Vascular: Left below-knee amputation well-healed.  2+ soft pitting edema  Vessel Right Left  Radial Palpable Palpable  Gastrointestinal: soft, non-distended. No guarding/no peritoneal signs.  Musculoskeletal: M/S 5/5 throughout.  No deformity.  Neurologic: CN 2-12 intact. Pain and light touch intact in extremities.  Symmetrical.  Speech is fluent. Motor exam as listed above. Psychiatric: Judgment intact, Mood & affect appropriate for pt's clinical situation. Dermatologic: Venous rashes no ulcers noted.  No changes consistent with cellulitis. Lymph : No lichenification or skin changes of chronic lymphedema.  CBC Lab Results  Component Value Date   WBC 12.8 (H) 10/20/2022   HGB 15.7 10/20/2022   HCT 48.8 10/20/2022   MCV 98.6 10/20/2022   PLT 351 10/20/2022    BMET    Component Value Date/Time   NA 140 10/20/2022 1013   NA 141 10/25/2014 2206   K 4.0 10/20/2022 1013   K 4.0 10/25/2014 2206   CL 108 10/20/2022 1013   CL 109 (H) 10/25/2014 2206   CO2 24 10/20/2022 1013   CO2 26 10/25/2014  2206   GLUCOSE 135 (H) 10/20/2022 1013   GLUCOSE 210 (H) 10/25/2014 2206   BUN 24 (H) 10/20/2022 1013   BUN 21 08/15/2016 1618   BUN 15 10/25/2014 2206   CREATININE 1.26 (H) 10/20/2022 1013   CREATININE 1.32 (H) 10/25/2014 2206   CALCIUM 8.8 (L) 10/20/2022 1013   CALCIUM 8.2 (L) 10/25/2014 2206   GFRNONAA 59 (L) 10/20/2022 1013   GFRNONAA 58 (L) 10/25/2014 2206   GFRAA >60 03/27/2019 0637   GFRAA >60 10/25/2014 2206   Estimated Creatinine Clearance: 49 mL/min (A) (by C-G formula based on SCr of 1.26 mg/dL (H)).  COAG Lab Results  Component Value Date   INR 2.9 (H) 03/28/2022   INR 2.8 (H) 03/27/2022   INR 2.0 (H) 03/26/2022    Radiology No results  found.   Assessment/Plan 1.  Deep vein thrombosis (DVT) of proximal vein of both lower extremities, unspecified chronicity (HCC) Swelling in the left lower extremity is likely related to postphlebitic changes.  We discussed that in instances where patients have had DVTs swelling has a common occurrence at times.  Warning signs to be concerned with this sudden painful swelling that is abrupt.  Versus the gradual swelling that comes and goes daily.  Recommended to utilize medical compression stockings daily   Following discussion with the patient we will plan to move forward with IVC filter removal.  We have discussed the risk benefits and alternatives and the patient is agreeable to proceed.  The patient will follow-up in office postintervention.  2. Hx of BKA, left (HCC) Currently the patient is ambulating doing well with his prosthetic.   3. Primary hypertension Continue antihypertensive medications as already ordered, these medications have been reviewed and there are no changes at this time.   [name], MD  11/01/2022 2:14 PM

## 2022-11-01 NOTE — Op Note (Signed)
  OPERATIVE NOTE   PRE-OPERATIVE DIAGNOSIS: DVT with PE  POST-OPERATIVE DIAGNOSIS: Same  PROCEDURE: Retrieval of IVC Filter Inferior Vena Cavagram  SURGEON: Katha Cabal, M.D.  ANESTHESIA:  Conscious sedation was administered under my direct supervision by the interventional radiology RN. IV Versed plus fentanyl were utilized. Continuous ECG, pulse oximetry and blood pressure was monitored throughout the entire procedure. Conscious sedation was for a total of 25 minutes.  ESTIMATED BLOOD LOSS: Minimal cc  Fluoroscopy time: 1.4 minutes  Contrast used: 20 cc.  FINDING(S):inferior vena cava is widely patent filter is in place in good position. Filter is removed without incident  SPECIMEN(S):  IVC filter intact  INDICATIONS:   Phillip [name] is a 77 y.o. male who presents with DVT and PE. The patient has now tolerated anticoagulation for several months. Therefore, the IVC filter is recommended to be removed. The risks and benefits were reviewed with the patient all questions were answered and they agreed to proceed with IVC filter retrieval. Oral anticoagulation will be continued.  DESCRIPTION: After obtaining full informed written consent, the patient was brought back to the Special Procedure Suite and placed in the supine position.  The patient received IV antibiotics prior to induction.  After obtaining adequate sedation, the patient was prepped and draped in the standard fashion and appropriate time out is called.     Ultrasound was placed in a sterile sleeve.The right neck was then imaged with ultrasound.   Jugular vein was identified it is echolucent and homogeneous indicating patency. 1% lidocaine is then infiltrated under ultrasound visualization and subsequently a Seldinger needle is inserted under real-time ultrasound guidance.  J-wire is then advanced into the inferior vena cava under fluoroscopic guidance. With the tip of the sheath at the confluence of the iliac veins  inferior vena caval imaging is performed.  After review of the image the sheath is repositioned to above the filter and the snares introduced. Snares opened and the hook is secured without difficulty. The filter is then collapsed within the sheath and removed without difficulty.  Sheath is removed by pressures held the patient tolerated the procedure well and there were no immediate complications.  Interpretation: inferior vena cava is widely patent filter is in place in good position. Filter is removed without incident.     COMPLICATIONS: None  CONDITION: [name], M.D. [facility] Office: [phone]   11/01/2022, 3:44 PM

## 2022-11-01 NOTE — Progress Notes (Signed)
MRN : [id number]  Phillip [name] is a 77 y.o. (30-Sep-1945) male who presents with chief complaint of legs hurt and swell.  History of Present Illness:   Patient presents to [facility] for retrieval of previously placed IVC filter.  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 Shortly following his amputation the patient developed bilateral DVTs with subsequent pulmonary embolism with pulmonary thrombectomy on 03/18/2022. Due to the clot burden he underwent IVC placement on 03/19/2022. The patient subsequently had a worsening clot development and underwent additional pulmonary thrombectomy on 03/23/2022.  At this point he is ambulating with his prosthesis and has completed his physical therapy.  He has also completed his initial 6 months of anticoagulation without any hemorrhagic complications.  There is therefore appropriate to take his IVC filter out while continuing his anticoagulation.  No outpatient medications have been marked as taking for the 11/01/22 encounter [facility] Encounter).    Past Medical History:  Diagnosis Date   Arthritis    Basal cell carcinoma    L clavicle, L prox forearm- removed years ago    Bladder cancer (HCC)    Bladder neck contracture    CAD (coronary artery disease)    a. 12/2020 NSTEMI/Cath: LM nl, LAD 95ost/p, LCX 50p, RCA nl. EF 45-50%.   Cataract    CKD (chronic kidney disease), stage III [facility])    ED (erectile dysfunction)    Frequency    GERD (gastroesophageal reflux disease)    History of pulmonary embolus (PE) 11/2018   a. Following LE DVT-->Chronic warfarin.   Hyperlipidemia LDL goal <70    Hypertension    Hypothyroidism    Incontinence of urine    sui, s/p cryoablation   Ischemic cardiomyopathy    a. 11/2018  Echo: EF 60-65%; b. 12/2020 LV Gram: EF 45-50% in setting of NSTEMI.   Kidney stones    Neuropathy    feet   Nocturia    Prostate cancer ([place])    S/P   CRYOABLATION   Right elbow tendinitis    Right Lower Extremity DVT (deep venous thrombosis) ([place]) 11/25/2018   Vertigo    1-2x/yr   Wears glasses     Past Surgical History:  Procedure Laterality Date   AMPUTATION Left 03/16/2022   Procedure: AMPUTATION BELOW KNEE;  Surgeon: Katha Cabal, MD;  Location: [facility] ORS;  Service: Vascular;  Laterality: Left;   ARTHROPLASTY AND TENDON REPAIR LEFT THUMB  JAN 2014   CATARACT EXTRACTION W/PHACO Left 11/16/2020   Procedure: CATARACT EXTRACTION PHACO AND INTRAOCULAR LENS PLACEMENT (IOC) LEFT  7.09 00:56.4 12.6%;  Surgeon: Leandrew Koyanagi, MD;  Location: [place];  Service: Ophthalmology;  Laterality: Left;   CATARACT EXTRACTION W/PHACO Right 12/02/2020   Procedure: CATARACT EXTRACTION PHACO AND INTRAOCULAR LENS PLACEMENT (IOC) RIGHT MALYUGIN 5.52 00:49.7 11.1%;  Surgeon: Leandrew Koyanagi, MD;  Location: [place];  Service: Ophthalmology;  Laterality: Right;   COLONOSCOPY     COLONOSCOPY WITH PROPOFOL N/A 08/09/2017   Procedure: COLONOSCOPY WITH PROPOFOL;  Surgeon: Manya Silvas, MD;  Location: [facility] ENDOSCOPY;  Service: Endoscopy;  Laterality: N/A;   CORONARY ANGIOGRAPHY N/A 12/28/2020   Procedure: CORONARY ANGIOGRAPHY;  Surgeon: Sherren Mocha, MD;  Location: [place] CV LAB;  Service: Cardiovascular;  Laterality: N/A;  CORONARY STENT INTERVENTION N/A 12/28/2020   Procedure: CORONARY STENT INTERVENTION;  Surgeon: Sherren Mocha, MD;  Location: [place] CV LAB;  Service: Cardiovascular;  Laterality: N/A;   ESOPHAGOGASTRODUODENOSCOPY (EGD) WITH PROPOFOL N/A 08/09/2017   Procedure: ESOPHAGOGASTRODUODENOSCOPY (EGD) WITH PROPOFOL;  Surgeon: Manya Silvas, MD;  Location: [facility] ENDOSCOPY;  Service: Endoscopy;  Laterality: N/A;   GREEN LIGHT LASER TURP  (TRANSURETHRAL RESECTION OF PROSTATE N/A 12/14/2015   Procedure: GREEN LIGHT LASER TURP (TRANSURETHRAL RESECTION OF PROSTATE) LASER OF BLADDER NECK CONTRACTURE;  Surgeon: Carolan Clines, MD;  Location: [place];  Service: Urology;  Laterality: N/A;   IVC FILTER INSERTION N/A 03/19/2022   Procedure: IVC FILTER INSERTION;  Surgeon: Serafina Mitchell, MD;  Location: [place] CV LAB;  Service: Cardiovascular;  Laterality: N/A;   LEFT HEART CATH AND CORONARY ANGIOGRAPHY N/A 12/24/2020   Procedure: LEFT HEART CATH AND CORONARY ANGIOGRAPHY and possible PCI and stent;  Surgeon: Yolonda Kida, MD;  Location: [place] CV LAB;  Service: Cardiovascular;  Laterality: N/A;   LEFT HEART CATH AND CORONARY ANGIOGRAPHY N/A 01/06/2021   Procedure: LEFT HEART CATH AND CORONARY ANGIOGRAPHY;  Surgeon: Troy Sine, MD;  Location: [place] CV LAB;  Service: Cardiovascular;  Laterality: N/A;   LOWER EXTREMITY ANGIOGRAPHY Left 03/11/2022   Procedure: Lower Extremity Angiography;  Surgeon: Katha Cabal, MD;  Location: [place] CV LAB;  Service: Cardiovascular;  Laterality: Left;   PENILE PROSTHESIS IMPLANT N/A 12/06/2013   Procedure: PENILE PROTHESIS INFLATABLE;  Surgeon: Ailene Rud, MD;  Location[facility];  Service: Urology;  Laterality: N/A;   PROSTATE CRYOABLATION  06-01-2012  [place]   PULMONARY THROMBECTOMY N/A 03/18/2022   Procedure: PULMONARY THROMBECTOMY;  Surgeon: Algernon Huxley, MD;  Location: [place] CV LAB;  Service: Cardiovascular;  Laterality: N/A;   PULMONARY THROMBECTOMY Bilateral 03/23/2022   Procedure: PULMONARY THROMBECTOMY;  Surgeon: Katha Cabal, MD;  Location: [place] CV LAB;  Service: Cardiovascular;  Laterality: Bilateral;   THROMBECTOMY ILIAC ARTERY Left 03/12/2022   Procedure: THROMBECTOMY LEG;  Surgeon: Conrad Shallotte, MD;  Location: [facility] ORS;  Service: Vascular;  Laterality: Left;   TRANSURETHRAL RESECTION OF  BLADDER NECK N/A 03/20/2015   Procedure: RELEASE BLADDER NECK CONTRACTURE  WITH WOLF BUTTON ELECTRODE ;  Surgeon: Carolan Clines, MD;  Location: [place];  Service: Urology;  Laterality: N/A;   TRANSURETHRAL RESECTION OF BLADDER TUMOR N/A 12/05/2018   Procedure: TRANSURETHRAL RESECTION OF BLADDER TUMOR (TURBT)/CYSTOSCOPY/  INSTILLATION OF Rodell Perna;  Surgeon: Ceasar Mons, MD;  Location: [facility];  Service: Urology;  Laterality: N/A;   TRANSURETHRAL RESECTION OF BLADDER TUMOR N/A 01/15/2020   Procedure: TRANSURETHRAL RESECTION OF BLADDER TUMOR (TURBT)/ CYSTOSCOPY;  Surgeon: Ceasar Mons, MD;  Location: [facility];  Service: Urology;  Laterality: N/A;   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      Social History Social History   Tobacco Use   Smoking status: Never    Passive exposure: Never   Smokeless tobacco: Never  Vaping Use   Vaping Use: Never used  Substance Use Topics   Alcohol use: No   Drug use: No    Family History Family History  Problem Relation Age of Onset   CAD Father 66    Allergies  Allergen Reactions   Doxazosin Other (See Comments)    Other reaction(s): Unknown     REVIEW OF SYSTEMS (Negative unless checked)  Constitutional: []$ Weight loss  []$ Fever  []$ Chills Cardiac: []$ Chest pain   []$ Chest pressure   []$ Palpitations   []$ Shortness of breath when laying flat   []$ Shortness of breath with exertion. Vascular:  []$ Pain in legs with walking   [x]$ Pain in legs at rest  []$ History of DVT   []$ Phlebitis   [x]$ Swelling in legs   []$ Varicose veins   []$ Non-healing ulcers Pulmonary:   []$ Uses home oxygen   []$ Productive cough   []$ Hemoptysis   []$ Wheeze  []$ COPD   []$ Asthma Neurologic:  []$ Dizziness   []$ Seizures   []$ History of stroke   []$ History of TIA  []$ Aphasia   []$ Vissual changes   []$ Weakness or numbness in arm   []$ Weakness or numbness in leg Musculoskeletal:   []$ Joint swelling    []$ Joint pain   []$ Low back pain Hematologic:  []$ Easy bruising  []$ Easy bleeding   []$ Hypercoagulable state   []$ Anemic Gastrointestinal:  []$ Diarrhea   []$ Vomiting  []$ Gastroesophageal reflux/heartburn   []$ Difficulty swallowing. Genitourinary:  []$ Chronic kidney disease   []$ Difficult urination  []$ Frequent urination   []$ Blood in urine Skin:  []$ Rashes   []$ Ulcers  Psychological:  []$ History of anxiety   []$  History of major depression.  Physical Examination  There were no vitals filed for this visit. There is no height or weight on file to calculate BMI. Gen: WD/WN, NAD Head: [place]/AT, No temporalis wasting.  Ear/Nose/Throat: Hearing grossly intact, nares w/o erythema or drainage, pinna without lesions Eyes: PER, EOMI, sclera nonicteric.  Neck: Supple, no gross masses.  No JVD.  Pulmonary:  Good air movement, no audible wheezing, no use of accessory muscles.  Cardiac: RRR, precordium not hyperdynamic. Vascular: Left below-knee amputation well-healed.  2+ soft pitting edema  Vessel Right Left  Radial Palpable Palpable  Gastrointestinal: soft, non-distended. No guarding/no peritoneal signs.  Musculoskeletal: M/S 5/5 throughout.  No deformity.  Neurologic: CN 2-12 intact. Pain and light touch intact in extremities.  Symmetrical.  Speech is fluent. Motor exam as listed above. Psychiatric: Judgment intact, Mood & affect appropriate for pt's clinical situation. Dermatologic: Venous rashes no ulcers noted.  No changes consistent with cellulitis. Lymph : No lichenification or skin changes of chronic lymphedema.  CBC Lab Results  Component Value Date   WBC 12.8 (H) 10/20/2022   HGB 15.7 10/20/2022   HCT 48.8 10/20/2022   MCV 98.6 10/20/2022   PLT 351 10/20/2022    BMET    Component Value Date/Time   NA 140 10/20/2022 1013   NA 141 10/25/2014 2206   K 4.0 10/20/2022 1013   K 4.0 10/25/2014 2206   CL 108 10/20/2022 1013   CL 109 (H) 10/25/2014 2206   CO2 24 10/20/2022 1013   CO2 26 10/25/2014  2206   GLUCOSE 135 (H) 10/20/2022 1013   GLUCOSE 210 (H) 10/25/2014 2206   BUN 24 (H) 10/20/2022 1013   BUN 21 08/15/2016 1618   BUN 15 10/25/2014 2206   CREATININE 1.26 (H) 10/20/2022 1013   CREATININE 1.32 (H) 10/25/2014 2206   CALCIUM 8.8 (L) 10/20/2022 1013   CALCIUM 8.2 (L) 10/25/2014 2206   GFRNONAA 59 (L) 10/20/2022 1013   GFRNONAA 58 (L) 10/25/2014 2206   GFRAA >60 03/27/2019 0637   GFRAA >60 10/25/2014 2206   Estimated Creatinine Clearance: 49 mL/min (A) (by C-G formula based on SCr of 1.26 mg/dL (H)).  COAG Lab Results  Component Value Date   INR 2.9 (H) 03/28/2022   INR 2.8 (H) 03/27/2022   INR 2.0 (H) 03/26/2022    Radiology No results  found.   Assessment/Plan 1.  Deep vein thrombosis (DVT) of proximal vein of both lower extremities, unspecified chronicity (HCC) Swelling in the left lower extremity is likely related to postphlebitic changes.  We discussed that in instances where patients have had DVTs swelling has a common occurrence at times.  Warning signs to be concerned with this sudden painful swelling that is abrupt.  Versus the gradual swelling that comes and goes daily.  Recommended to utilize medical compression stockings daily   Following discussion with the patient we will plan to move forward with IVC filter removal.  We have discussed the risk benefits and alternatives and the patient is agreeable to proceed.  The patient will follow-up in office postintervention.  2. Hx of BKA, left (HCC) Currently the patient is ambulating doing well with his prosthetic.   3. Primary hypertension Continue antihypertensive medications as already ordered, these medications have been reviewed and there are no changes at this time.   [name], MD  11/01/2022 2:14 PM

## 2022-11-01 NOTE — Progress Notes (Signed)
Patient spontaneously converted from ST to NSR

## 2022-11-01 NOTE — Interval H&P Note (Signed)
History and Physical Interval Note:  11/01/2022 2:26 PM  [name]  has presented today for surgery, with the diagnosis of IVC Filter Removal   DVT.  The various methods of treatment have been discussed with the patient and family. After consideration of risks, benefits and other options for treatment, the patient has consented to  Procedure(s): IVC FILTER REMOVAL (N/A) as a surgical intervention.  The patient's history has been reviewed, patient examined, no change in status, stable for surgery.  I have reviewed the patient's chart and labs.  Questions were answered to the patient's satisfaction.     Phillip [name]

## 2022-11-01 NOTE — Progress Notes (Signed)
Pt noted to be in sinus tach and performed vagal maneuver (hold breath and bear down with slow exhale),  pt then converted back to sinus rhythm

## 2022-11-02 ENCOUNTER — Encounter: Payer: Self-pay | Admitting: Vascular Surgery

## 2022-11-03 ENCOUNTER — Ambulatory Visit: Payer: HMO | Admitting: Physical Therapy

## 2022-11-03 ENCOUNTER — Encounter: Payer: Self-pay | Admitting: Physical Therapy

## 2022-11-03 DIAGNOSIS — R269 Unspecified abnormalities of gait and mobility: Secondary | ICD-10-CM

## 2022-11-03 DIAGNOSIS — R002 Palpitations: Secondary | ICD-10-CM | POA: Diagnosis not present

## 2022-11-03 DIAGNOSIS — I480 Paroxysmal atrial fibrillation: Secondary | ICD-10-CM | POA: Diagnosis not present

## 2022-11-03 DIAGNOSIS — M6281 Muscle weakness (generalized): Secondary | ICD-10-CM

## 2022-11-03 DIAGNOSIS — E782 Mixed hyperlipidemia: Secondary | ICD-10-CM | POA: Diagnosis not present

## 2022-11-03 DIAGNOSIS — J9601 Acute respiratory failure with hypoxia: Secondary | ICD-10-CM | POA: Diagnosis not present

## 2022-11-03 DIAGNOSIS — I2510 Atherosclerotic heart disease of native coronary artery without angina pectoris: Secondary | ICD-10-CM | POA: Diagnosis not present

## 2022-11-03 DIAGNOSIS — Z955 Presence of coronary angioplasty implant and graft: Secondary | ICD-10-CM | POA: Diagnosis not present

## 2022-11-03 DIAGNOSIS — Z86711 Personal history of pulmonary embolism: Secondary | ICD-10-CM | POA: Diagnosis not present

## 2022-11-03 DIAGNOSIS — R42 Dizziness and giddiness: Secondary | ICD-10-CM | POA: Diagnosis not present

## 2022-11-03 DIAGNOSIS — I5033 Acute on chronic diastolic (congestive) heart failure: Secondary | ICD-10-CM | POA: Diagnosis not present

## 2022-11-03 DIAGNOSIS — I10 Essential (primary) hypertension: Secondary | ICD-10-CM | POA: Diagnosis not present

## 2022-11-03 DIAGNOSIS — E119 Type 2 diabetes mellitus without complications: Secondary | ICD-10-CM | POA: Diagnosis not present

## 2022-11-03 DIAGNOSIS — Z89512 Acquired absence of left leg below knee: Secondary | ICD-10-CM

## 2022-11-03 NOTE — Therapy (Signed)
OUTPATIENT PHYSICAL THERAPY LOWER EXTREMITY TREATMENT  Patient Name: Phillip [name] MRN: [id number] DOB:1946/01/03, 77 y.o., male Today's Date: 11/03/2022    PT End of Session - 11/03/22 0823     Visit Number 29    Number of Visits 35    Date for PT Re-Evaluation 11/24/22    PT Start Time 0814    PT Stop Time 0901    PT Time Calculation (min) 47 min    Equipment Utilized During Treatment Gait belt    Activity Tolerance Patient tolerated treatment well    Behavior During Therapy WFL for tasks assessed/performed             Past Medical History:  Diagnosis Date   Arthritis    Basal cell carcinoma    L clavicle, L prox forearm- removed years ago    Bladder cancer ([place])    Bladder neck contracture    CAD (coronary artery disease)    a. 12/2020 NSTEMI/Cath: LM nl, LAD 95ost/p, LCX 50p, RCA nl. EF 45-50%.   Cataract    CKD (chronic kidney disease), stage III [facility])    ED (erectile dysfunction)    Frequency    GERD (gastroesophageal reflux disease)    History of pulmonary embolus (PE) 11/2018   a. Following LE DVT-->Chronic warfarin.   Hyperlipidemia LDL goal <70    Hypertension    Hypothyroidism    Incontinence of urine    sui, s/p cryoablation   Ischemic cardiomyopathy    a. 11/2018 Echo: EF 60-65%; b. 12/2020 LV Gram: EF 45-50% in setting of NSTEMI.   Kidney stones    Neuropathy    feet   Nocturia    Prostate cancer ([place])    S/P   CRYOABLATION   Right elbow tendinitis    Right Lower Extremity DVT (deep venous thrombosis) ([place]) 11/25/2018   Vertigo    1-2x/yr   Wears glasses    Past Surgical History:  Procedure Laterality Date   AMPUTATION Left 03/16/2022   Procedure: AMPUTATION BELOW KNEE;  Surgeon: Katha Cabal, MD;  Location: [facility] ORS;  Service: Vascular;  Laterality: Left;   ARTHROPLASTY AND TENDON REPAIR LEFT THUMB  JAN 2014   CATARACT EXTRACTION W/PHACO Left 11/16/2020   Procedure: CATARACT EXTRACTION PHACO AND INTRAOCULAR LENS PLACEMENT (IOC) LEFT   7.09 00:56.4 12.6%;  Surgeon: Leandrew Koyanagi, MD;  Location: [place];  Service: Ophthalmology;  Laterality: Left;   CATARACT EXTRACTION W/PHACO Right 12/02/2020   Procedure: CATARACT EXTRACTION PHACO AND INTRAOCULAR LENS PLACEMENT (IOC) RIGHT MALYUGIN 5.52 00:49.7 11.1%;  Surgeon: Leandrew Koyanagi, MD;  Location: [place];  Service: Ophthalmology;  Laterality: Right;   COLONOSCOPY     COLONOSCOPY WITH PROPOFOL N/A 08/09/2017   Procedure: COLONOSCOPY WITH PROPOFOL;  Surgeon: Manya Silvas, MD;  Location[facility] ENDOSCOPY;  Service: Endoscopy;  Laterality: N/A;   CORONARY ANGIOGRAPHY N/A 12/28/2020   Procedure: CORONARY ANGIOGRAPHY;  Surgeon: Sherren Mocha, MD;  Location: [place] CV LAB;  Service: Cardiovascular;  Laterality: N/A;   CORONARY STENT INTERVENTION N/A 12/28/2020   Procedure: CORONARY STENT INTERVENTION;  Surgeon: Sherren Mocha, MD;  Location: [place] CV LAB;  Service: Cardiovascular;  Laterality: N/A;   ESOPHAGOGASTRODUODENOSCOPY (EGD) WITH PROPOFOL N/A 08/09/2017   Procedure: ESOPHAGOGASTRODUODENOSCOPY (EGD) WITH PROPOFOL;  Surgeon: Manya Silvas, MD;  Location: [facility] ENDOSCOPY;  Service: Endoscopy;  Laterality: N/A;   GREEN LIGHT LASER TURP (TRANSURETHRAL RESECTION OF PROSTATE N/A 12/14/2015   Procedure: GREEN LIGHT LASER TURP (TRANSURETHRAL RESECTION OF PROSTATE)  LASER OF BLADDER NECK CONTRACTURE;  Surgeon: Carolan Clines, MD;  Location: [facility];  Service: Urology;  Laterality: N/A;   IVC FILTER INSERTION N/A 03/19/2022   Procedure: IVC FILTER INSERTION;  Surgeon: Serafina Mitchell, MD;  Location: [place] CV LAB;  Service: Cardiovascular;  Laterality: N/A;   IVC FILTER REMOVAL N/A 11/01/2022   Procedure: IVC FILTER REMOVAL;  Surgeon: Katha Cabal, MD;  Location: [place] CV LAB;  Service: Cardiovascular;  Laterality: N/A;   LEFT HEART CATH AND CORONARY ANGIOGRAPHY N/A 12/24/2020   Procedure: LEFT HEART  CATH AND CORONARY ANGIOGRAPHY and possible PCI and stent;  Surgeon: Yolonda Kida, MD;  Location: [place] CV LAB;  Service: Cardiovascular;  Laterality: N/A;   LEFT HEART CATH AND CORONARY ANGIOGRAPHY N/A 01/06/2021   Procedure: LEFT HEART CATH AND CORONARY ANGIOGRAPHY;  Surgeon: Troy Sine, MD;  Location: [place] CV LAB;  Service: Cardiovascular;  Laterality: N/A;   LOWER EXTREMITY ANGIOGRAPHY Left 03/11/2022   Procedure: Lower Extremity Angiography;  Surgeon: Katha Cabal, MD;  Location: [place] CV LAB;  Service: Cardiovascular;  Laterality: Left;   PENILE PROSTHESIS IMPLANT N/A 12/06/2013   Procedure: PENILE PROTHESIS INFLATABLE;  Surgeon: Ailene Rud, MD;  Location: [facility];  Service: Urology;  Laterality: N/A;   PROSTATE CRYOABLATION  06-01-2012  [facility]   PULMONARY THROMBECTOMY N/A 03/18/2022   Procedure: PULMONARY THROMBECTOMY;  Surgeon: Algernon Huxley, MD;  Location: [place] CV LAB;  Service: Cardiovascular;  Laterality: N/A;   PULMONARY THROMBECTOMY Bilateral 03/23/2022   Procedure: PULMONARY THROMBECTOMY;  Surgeon: Katha Cabal, MD;  Location: [place] CV LAB;  Service: Cardiovascular;  Laterality: Bilateral;   THROMBECTOMY ILIAC ARTERY Left 03/12/2022   Procedure: THROMBECTOMY LEG;  Surgeon: Conrad Du Bois, MD;  Location: [facility] ORS;  Service: Vascular;  Laterality: Left;   TRANSURETHRAL RESECTION OF BLADDER NECK N/A 03/20/2015   Procedure: RELEASE BLADDER NECK CONTRACTURE  WITH WOLF BUTTON ELECTRODE ;  Surgeon: Carolan Clines, MD;  Location: [place];  Service: Urology;  Laterality: N/A;   TRANSURETHRAL RESECTION OF BLADDER TUMOR N/A 12/05/2018   Procedure: TRANSURETHRAL RESECTION OF BLADDER TUMOR (TURBT)/CYSTOSCOPY/  INSTILLATION OF Rodell Perna;  Surgeon: Ceasar Mons, MD;  Location: [facility];  Service: Urology;  Laterality: N/A;   TRANSURETHRAL RESECTION OF BLADDER TUMOR  N/A 01/15/2020   Procedure: TRANSURETHRAL RESECTION OF BLADDER TUMOR (TURBT)/ CYSTOSCOPY;  Surgeon: Ceasar Mons, MD;  Location: [facility];  Service: Urology;  Laterality: N/A;   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     Patient Active Problem List   Diagnosis Date Noted   Iron deficiency anemia 06/20/2022   Myeloproliferative disorder ([place]) 05/19/2022   History of bladder cancer 05/19/2022   Goals of care, counseling/discussion 03/23/2022   Reactive thrombocytosis 03/21/2022   Paroxysmal atrial fibrillation with RVR ([place]) 03/20/2022   Acute bilateral deep vein thrombosis (DVT) of femoral veins (H[facility]) 03/20/2022   Acute respiratory failure with hypoxia (H[facility]) 03/16/2022   Acute on chronic diastolic CHF (congestive heart failure) ([place]) 03/16/2022   PSVT (paroxysmal supraventricular tachycardia) 03/16/2022   AKI (acute kidney injury) ([place]) 03/15/2022   Critical limb ischemia of left lower extremity with ulceration of lower leg ([place]) 03/15/2022   Arterial occlusion 03/11/2022   Elevated troponin    Unstable angina ([place]) 01/05/2021   Hyperlipidemia LDL goal <70    CKD (chronic kidney disease), stage III ([place])  Coronary artery disease    NSTEMI (non-ST elevated myocardial infarction) ([place]) 12/23/2020   Acquired hypothyroidism 04/20/2020   Chemotherapy-induced neuropathy ([place]) 04/20/2020   Nonthrombocytopenic purpura ([place]) 12/03/2019   Hypercoagulable state ([place]) 12/10/2018   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) 12/07/2018   DM type 2 with diabetic mixed hyperlipidemia ([place]) 04/04/2018   Pain in right hand 10/04/2017   Tubular adenoma [id number]   Helicobacter pylori (H. pylori) infection 09/04/2017    Medicare annual wellness visit, initial 03/31/2017   History of prostate cancer 10/27/2014   Lower urinary tract symptoms (LUTS) 03/18/2012   Nephrolithiasis 03/18/2012    PCP: Rusty Aus, MD  REFERRING PROVIDER: Kris Hartmann, NP  REFERRING DIAG: Left below knee amputee  THERAPY DIAG:  Gait difficulty  Muscle weakness (generalized)  Left below-knee amputee [facility])  Rationale for Evaluation and Treatment Rehabilitation  ONSET DATE: 03/16/22  SUBJECTIVE:   SUBJECTIVE STATEMENT:   EVALUATION Pt. S/p R BKA after blood clots in lower leg.  Pt. Reports minimum phantom limb symptoms and occasional muscle spasms.  Pt. Takes Baclofen to manage symptoms.  Pt. Received prosthetic leg on Wednesday from [facility].  Pt. Has h/o low back pain and has received spinal epidurals in past (2 years ago).    PERTINENT HISTORY: 1. Hx of BKA, left [facility]) Mr. Zamir Dudzik was seen for further evaluation for left below knee prosthesis.  The is a 77 year old male who had amputation on 03/16/2022.  At the time he is well healed and ready for fitting of new below knee prosthesis.  He is a highly motivated individual and should do well once fitted with prosthesis.  She has no problem returning to a K2 ambulator, which will allow him to walk inside and outside of his home and overload level barriers.    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  He will be having a bone marrow biopsy.  We will continue to have the patient keep his IVC filter for now as there may be further test.  We will have the patient return in 3 months to discuss filter removal.   PAIN:  Are you having pain? No  PRECAUTIONS: None  WEIGHT BEARING RESTRICTIONS No  FALLS:  Has patient fallen in last 6 months? No  LIVING ENVIRONMENT: Lives with: lives with their spouse Lives in: House/apartment Stairs: Yes: Internal: 13+ steps; on right going up Has following equipment at home: Single  point cane and Walker - 2 wheeled  OCCUPATION: Retired  PLOF: [place] with walking and return to playing golf by spring.     OBJECTIVE:   PATIENT SURVEYS:  FOTO initial 40/ goal 59.    2/15: 78  COGNITION:  Overall cognitive status: Within functional limits for tasks assessed     SENSATION: WFL  EDEMA:  Circumferential: L/R knee joint line (34/36 cm.), calf (30.5/ 33 cm), distal quad (35/36 cm).    POSTURE: rounded shoulders and forward head  PALPATION: No tenderness/ good incision healing on residual limb  LOWER EXTREMITY ROM:  B LE WFL (all planes).  L knee 0-130 deg.   LOWER EXTREMITY MMT:  R LE muscle strength grossly 5/5 MMT except hip flexion 4+/5 MMT.  L LE strength grossly  5/5 MMT except hip flexion 4/5 MMT and hip abduction 4+/5 MMT.    FUNCTIONAL TESTS:  5 times sit to stand: 14.1 sec. And requires R UE assist.  Able to stand from chair with addition of 2" Airex pad.    GAIT: Distance walked: in clinic/ //-bars Assistive device utilized: Environmental consultant - 2 wheeled Level of assistance: Min A Comments: Ambulates in clinic with RW and min. A/ cuing for proper technique and step pattern.  Pt. Able to ambulate in //-bars with R UE assist only and 2-point gait pattern.    11/27:  5xSTS: 13.04 sec. (No UE assist).  1/11: 5xSTS: 9.47 sec./ 8.19 sec.  R LE muscle strength grossly 5/5 MMT except hip flexion 4+/5 MMT.  L LE strength grossly 5/5 MMT except hip flexion 4/5 MMT and hip abduction 4+/5 MMT.  No significant changes to hip flexor strength noted.   TODAY'S TREATMENT:  11/03/22:  Subjective:  Pts. Recent surgical procedure went well.  No issues or limitations reported.  Pt. reports no pain prior to tx. Session.    There.ex.:  Nustep L6 10 min. Seat 10. B LE only to increase LE endurance.   Sit to stands in //-bars with cuing for proper technique/ posture.     Reviewed HEP  Neuro:  Functional Gait: 26/30  Recip.  Stairs with light to no UE assist on handrails.  Pt. Benefits from use of 1 handrail for safety.    Agility ladder: coordination task to decrease fall risk and increase balance. 4 laps (down/back)- forward and lateral.     Rebounder on Airex with NBOS/ tandem (L and R).    PATIENT EDUCATION:  Education details: Reviewed HEP Person educated: Patient Education method: Explanation, Demonstration, and Handouts Education comprehension: verbalized understanding and returned demonstration   HOME EXERCISE PROGRAM: See handouts.   ASSESSMENT:  CLINICAL IMPRESSION: Patient is highly motivated and hard working during standing/ walking tasks today. Pt. Is continuing to advance his dynamic stability. Pt. Has good prognosis to continue progressing to his hobbies/goals of returning to light golfing. Pt. Completes agility ladder with decrease cuing and only SBA for safety.  Pt. Requires cuing to maintain correct posture when performing activities to avoid swaying of the hips and compensation. Pt. Continues to have significant difficulty maintaining balance with activities involving one leg stance activities (L>R). Pt. will benefit from skilled PT services to increase LE muscle strength to improve independence with ADL's/walking/return to playing golf.      OBJECTIVE IMPAIRMENTS Abnormal gait, decreased activity tolerance, decreased balance, decreased endurance, decreased mobility, difficulty walking, decreased strength, impaired flexibility, improper body mechanics, postural dysfunction, and pain.   ACTIVITY LIMITATIONS carrying, lifting, bending, standing, squatting, stairs, transfers, toileting, dressing, and locomotion level  PARTICIPATION LIMITATIONS: driving, community activity, and yard work  PERSONAL FACTORS Fitness and Past/current experiences are also affecting patient's functional outcome.   REHAB POTENTIAL: Good  CLINICAL DECISION MAKING: Stable/uncomplicated  EVALUATION COMPLEXITY:  Low   GOALS: Goals reviewed with patient? Yes  SHORT TERM GOALS: Target date: 09/27/22 Pt. Independent with HEP to increase B hip strength 1/2 muscle grade to improve standing/walking mod. Independence.   Baseline:  see above.  L hip flexion 4/5 MMT.  Goal status: Goal met    LONG TERM GOALS: Target date: 11/24/22  Pt. Will increase FOTO to 59 to improve functional mobility.   Baseline:  initial FOTO 40.  11/8: 68.  2/15: 78 Goal status: Goal met  2.  Pt. Able to ascend/descend 13 stairs  with R handrail and consistent step pattern to improve pts. Ability to go to 2nd floor of home.  11/8: recip. Gait on stairs outside with R handrail for safety.   Baseline:  Goal status: Goal met  3.  Pt. Will ambulate with mod. I and SPC with consistent 2-point gait pattern and no loss of balance to improve independence at home/ community.   Baseline:  11/8: pt. Ambulates community distances Goal status: Goal met  4.  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    Baseline:  pt. Requires extra time/ SPC while walking outside and limited with carrying  Goal status: Initial  5.  Pt. Able to return to playing round of golf with no limitations safely.    Baseline:  pt. Demonstrates chipping golf balls in gym with SBA/CGA  Goal status: Initial   PLAN: PT FREQUENCY: 2x/week  PT DURATION: 4 weeks  PLANNED INTERVENTIONS: Therapeutic exercises, Therapeutic activity, Neuromuscular re-education, Balance training, Gait training, Patient/Family education, Self Care, Joint mobilization, Stair training, Prosthetic training, Manual therapy, and Re-evaluation  PLAN FOR NEXT SESSION: Progress gait to more skilled dynamic tasks.  Simulate golfing.  10th visit progress note  Ashlyn B. Rogers Blocker, SPT Pura Spice, PT, DPT # [phone] 11/03/2022, 4:36 PM

## 2022-11-14 ENCOUNTER — Encounter: Payer: Self-pay | Admitting: Physical Therapy

## 2022-11-14 ENCOUNTER — Ambulatory Visit: Payer: HMO | Admitting: Physical Therapy

## 2022-11-14 DIAGNOSIS — Z89512 Acquired absence of left leg below knee: Secondary | ICD-10-CM

## 2022-11-14 DIAGNOSIS — M6281 Muscle weakness (generalized): Secondary | ICD-10-CM

## 2022-11-14 DIAGNOSIS — R269 Unspecified abnormalities of gait and mobility: Secondary | ICD-10-CM | POA: Diagnosis not present

## 2022-11-14 NOTE — Therapy (Signed)
OUTPATIENT PHYSICAL THERAPY LOWER EXTREMITY TREATMENT/DISCHARGE Physical Therapy Progress Note  Dates of reporting period  10/06/22  to  11/14/22  Patient Name: Phillip [name] MRN: [id number] DOB:February 19, 1946, 77 y.o., male Today's Date: 11/15/2022    PT End of Session - 11/14/22 0828     Visit Number 30    Number of Visits 35    Date for PT Re-Evaluation 11/24/22    PT Start Time 0814    PT Stop Time 0904    PT Time Calculation (min) 50 min    Equipment Utilized During Treatment Gait belt    Activity Tolerance Patient tolerated treatment well    Behavior During Therapy WFL for tasks assessed/performed             Past Medical History:  Diagnosis Date   Arthritis    Basal cell carcinoma    L clavicle, L prox forearm- removed years ago    Bladder cancer ([place])    Bladder neck contracture    CAD (coronary artery disease)    a. 12/2020 NSTEMI/Cath: LM nl, LAD 95ost/p, LCX 50p, RCA nl. EF 45-50%.   Cataract    CKD (chronic kidney disease), stage III [facility])    ED (erectile dysfunction)    Frequency    GERD (gastroesophageal reflux disease)    History of pulmonary embolus (PE) 11/2018   a. Following LE DVT-->Chronic warfarin.   Hyperlipidemia LDL goal <70    Hypertension    Hypothyroidism    Incontinence of urine    sui, s/p cryoablation   Ischemic cardiomyopathy    a. 11/2018 Echo: EF 60-65%; b. 12/2020 LV Gram: EF 45-50% in setting of NSTEMI.   Kidney stones    Neuropathy    feet   Nocturia    Prostate cancer ([place])    S/P   CRYOABLATION   Right elbow tendinitis    Right Lower Extremity DVT (deep venous thrombosis) ([place]) 11/25/2018   Vertigo    1-2x/yr   Wears glasses    Past Surgical History:  Procedure Laterality Date   AMPUTATION Left 03/16/2022   Procedure: AMPUTATION BELOW KNEE;  Surgeon: Katha Cabal, MD;  Location: [facility];  Service: Vascular;  Laterality: Left;   ARTHROPLASTY AND TENDON REPAIR LEFT THUMB  JAN 2014   CATARACT EXTRACTION W/PHACO  Left 11/16/2020   Procedure: CATARACT EXTRACTION PHACO AND INTRAOCULAR LENS PLACEMENT (IOC) LEFT  7.09 00:56.4 12.6%;  Surgeon: Leandrew Koyanagi, MD;  Location: [place];  Service: Ophthalmology;  Laterality: Left;   CATARACT EXTRACTION W/PHACO Right 12/02/2020   Procedure: CATARACT EXTRACTION PHACO AND INTRAOCULAR LENS PLACEMENT (IOC) RIGHT MALYUGIN 5.52 00:49.7 11.1%;  Surgeon: Leandrew Koyanagi, MD;  Location: [place];  Service: Ophthalmology;  Laterality: Right;   COLONOSCOPY     COLONOSCOPY WITH PROPOFOL N/A 08/09/2017   Procedure: COLONOSCOPY WITH PROPOFOL;  Surgeon: Manya Silvas, MD;  Location: [facility]COPY;  Service: Endoscopy;  Laterality: N/A;   CORONARY ANGIOGRAPHY N/A 12/28/2020   Procedure: CORONARY ANGIOGRAPHY;  Surgeon: Sherren Mocha, MD;  Location: [place] CV LAB;  Service: Cardiovascular;  Laterality: N/A;   CORONARY STENT INTERVENTION N/A 12/28/2020   Procedure: CORONARY STENT INTERVENTION;  Surgeon: Sherren Mocha, MD;  Location: [place] CV LAB;  Service: Cardiovascular;  Laterality: N/A;   ESOPHAGOGASTRODUODENOSCOPY (EGD) WITH PROPOFOL N/A 08/09/2017   Procedure: ESOPHAGOGASTRODUODENOSCOPY (EGD) WITH PROPOFOL;  Surgeon: Manya Silvas, MD;  Location[facility] ENDOSCOPY;  Service: Endoscopy;  Laterality: N/A;   GREEN LIGHT LASER TURP (TRANSURETHRAL RESECTION  OF PROSTATE N/A 12/14/2015   Procedure: GREEN LIGHT LASER TURP (TRANSURETHRAL RESECTION OF PROSTATE) LASER OF BLADDER NECK CONTRACTURE;  Surgeon: Carolan Clines, MD;  Location: [place];  Service: Urology;  Laterality: N/A;   IVC FILTER INSERTION N/A 03/19/2022   Procedure: IVC FILTER INSERTION;  Surgeon: Serafina Mitchell, MD;  Location: [place] CV LAB;  Service: Cardiovascular;  Laterality: N/A;   IVC FILTER REMOVAL N/A 11/01/2022   Procedure: IVC FILTER REMOVAL;  Surgeon: Katha Cabal, MD;  Location: [place] CV LAB;  Service: Cardiovascular;   Laterality: N/A;   LEFT HEART CATH AND CORONARY ANGIOGRAPHY N/A 12/24/2020   Procedure: LEFT HEART CATH AND CORONARY ANGIOGRAPHY and possible PCI and stent;  Surgeon: Yolonda Kida, MD;  Location: [place] [facility];  Service: Cardiovascular;  Laterality: N/A;   LEFT HEART CATH AND CORONARY ANGIOGRAPHY N/A 01/06/2021   Procedure: LEFT HEART CATH AND CORONARY ANGIOGRAPHY;  Surgeon: Troy Sine, MD;  Location: [place] CV LAB;  Service: Cardiovascular;  Laterality: N/A;   LOWER EXTREMITY ANGIOGRAPHY Left 03/11/2022   Procedure: Lower Extremity Angiography;  Surgeon: Katha Cabal, MD;  Location: [place] CV LAB;  Service: Cardiovascular;  Laterality: Left;   PENILE PROSTHESIS IMPLANT N/A 12/06/2013   Procedure: PENILE PROTHESIS INFLATABLE;  Surgeon: Ailene Rud, MD;  Location: [facility];  Service: Urology;  Laterality: N/A;   PROSTATE CRYOABLATION  06-01-2012  [facility]   PULMONARY THROMBECTOMY N/A 03/18/2022   Procedure: PULMONARY THROMBECTOMY;  Surgeon: Algernon Huxley, MD;  Location: [place] [facility] LAB;  Service: Cardiovascular;  Laterality: N/A;   PULMONARY THROMBECTOMY Bilateral 03/23/2022   Procedure: PULMONARY THROMBECTOMY;  Surgeon: Katha Cabal, MD;  Location: [place] [facility];  Service: Cardiovascular;  Laterality: Bilateral;   THROMBECTOMY ILIAC ARTERY Left 03/12/2022   Procedure: THROMBECTOMY LEG;  Surgeon: Conrad Santiago, MD;  Location: [facility];  Service: Vascular;  Laterality: Left;   TRANSURETHRAL RESECTION OF BLADDER NECK N/A 03/20/2015   Procedure: RELEASE BLADDER NECK CONTRACTURE  WITH WOLF BUTTON ELECTRODE ;  Surgeon: Carolan Clines, MD;  Location: [place];  Service: Urology;  Laterality: N/A;   TRANSURETHRAL RESECTION OF BLADDER TUMOR N/A 12/05/2018   Procedure: TRANSURETHRAL RESECTION OF BLADDER TUMOR (TURBT)/CYSTOSCOPY/  INSTILLATION OF Rodell Perna;  Surgeon: Ceasar Mons, MD;  Location[facility];  Service: Urology;  Laterality: N/A;   TRANSURETHRAL RESECTION OF BLADDER TUMOR N/A 01/15/2020   Procedure: TRANSURETHRAL RESECTION OF BLADDER TUMOR (TURBT)/ CYSTOSCOPY;  Surgeon: Ceasar Mons, MD;  Location: [facility];  Service: Urology;  Laterality: N/A;   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     Patient Active Problem List   Diagnosis Date Noted   Iron deficiency anemia 06/20/2022   Myeloproliferative disorder ([place]) 05/19/2022   History of bladder cancer 05/19/2022   Goals of care, counseling/discussion 03/23/2022   Reactive thrombocytosis 03/21/2022   Paroxysmal atrial fibrillation with RVR ([place]) 03/20/2022   Acute bilateral deep vein thrombosis (DVT) of femoral veins (HCC) 03/20/2022   Acute respiratory failure with hypoxia (HCC) 03/16/2022   Acute on chronic diastolic CHF (congestive heart failure) ([place]) 03/16/2022   PSVT (paroxysmal supraventricular tachycardia) 03/16/2022   AKI (acute kidney injury) ([place]) 03/15/2022   Critical limb ischemia of left lower extremity with ulceration of lower leg ([place]) 03/15/2022   Arterial occlusion 03/11/2022   Elevated troponin    Unstable angina ([place]) 01/05/2021  Hyperlipidemia LDL goal <70    CKD (chronic kidney disease), stage III (HCC)    Coronary artery disease    NSTEMI (non-ST elevated myocardial infarction) ([place]) 12/23/2020   Acquired hypothyroidism 04/20/2020   Chemotherapy-induced neuropathy ([place]) 04/20/2020   Nonthrombocytopenic purpura ([place]) 12/03/2019   Hypercoagulable state ([place]) 12/10/2018   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 pylori) infection 09/04/2017   Medicare annual wellness visit, initial 03/31/2017   History of prostate cancer 10/27/2014   Lower urinary tract symptoms (LUTS) 03/18/2012   Nephrolithiasis 03/18/2012    PCP: Rusty Aus, MD  REFERRING PROVIDER: Kris Hartmann, NP  REFERRING DIAG: Left below knee amputee  THERAPY DIAG:  Gait difficulty  Muscle weakness (generalized)  Left below-knee amputee [facility])  Rationale for Evaluation and Treatment Rehabilitation  ONSET DATE: 03/16/22  SUBJECTIVE:   SUBJECTIVE STATEMENT:   EVALUATION Pt. S/p R BKA after blood clots in lower leg.  Pt. Reports minimum phantom limb symptoms and occasional muscle spasms.  Pt. Takes Baclofen to manage symptoms.  Pt. Received prosthetic leg on Wednesday from [facility].  Pt. Has h/o low back pain and has received spinal epidurals in past (2 years ago).    PERTINENT HISTORY: 1. Hx of BKA, left [facility]) Mr. Suleyman Boyte was seen for further evaluation for left below knee prosthesis.  The is a 77 year old male who had amputation on 03/16/2022.  At the time he is well healed and ready for fitting of new below knee prosthesis.  He is a highly motivated individual and should do well once fitted with prosthesis.  She has no problem returning to a K2 ambulator, which will allow him to walk inside and outside of his home and overload level barriers.    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  He will be having a bone marrow biopsy.  We will continue to have the patient keep his IVC filter for now as there may be further test.  We will have the patient return in 3 months to discuss filter removal.   PAIN:  Are you having pain? No  PRECAUTIONS: None  WEIGHT BEARING RESTRICTIONS No  FALLS:  Has patient fallen in last 6 months? No  LIVING ENVIRONMENT: Lives with: lives with their spouse Lives in:  House/apartment Stairs: Yes: Internal: 13+ steps; on right going up Has following equipment at home: Single point cane and Walker - 2 wheeled  OCCUPATION: Retired  PLOF: [place] with walking and return to playing golf by spring.     OBJECTIVE:   PATIENT SURVEYS:  FOTO initial 40/ goal 59.    2/15: 78  COGNITION:  Overall cognitive status: Within functional limits for tasks assessed     SENSATION: WFL  EDEMA:  Circumferential: L/R knee joint line (34/36 cm.), calf (30.5/ 33 cm), distal quad (35/36 cm).    POSTURE: rounded shoulders and forward head  PALPATION: No tenderness/ good incision healing on residual limb  LOWER EXTREMITY ROM:  B LE WFL (all planes).  L knee 0-130 deg.   LOWER EXTREMITY MMT:  R LE muscle strength grossly 5/5 MMT except hip flexion 4+/5 MMT.  L LE strength grossly 5/5 MMT except hip flexion 4/5 MMT and hip abduction 4+/5 MMT.    FUNCTIONAL TESTS:  5 times sit to stand: 14.1 sec. And requires R UE assist.  Able to stand from chair with addition of 2" Airex pad.    GAIT: Distance walked: in clinic/ //-bars Assistive device utilized: Environmental consultant - 2 wheeled Level of assistance: Min A Comments: Ambulates in clinic with RW and min. A/ cuing for proper technique and step pattern.  Pt. Able to ambulate in //-bars with R UE assist only and 2-point gait pattern.    11/27:  5xSTS: 13.04 sec. (No UE assist).  1/11: 5xSTS: 9.47 sec./ 8.19 sec.  R LE muscle strength grossly 5/5 MMT except hip flexion 4+/5 MMT.  L LE strength grossly 5/5 MMT except hip flexion 4/5 MMT and hip abduction 4+/5 MMT.  No significant changes to hip flexor strength noted.   Functional Gait: 26/30  TODAY'S TREATMENT:  11/14/22:  Subjective:  Pt. Played 9 holes of golf 2x last week.  Pt. Challenged with walking up/down hills to green.  Pt. Would occasionally use golf club for balance/ safety when walking on uneven terrain. Pt. Reports no LOB  and remains active.     There.ex.:  Nustep L6 10 min. Seat 10. B LE only to increase LE endurance.   Reviewed HEP  Neuro:  Walking in hallway with varying cadences and cuing to increase hip flexion/ step length/ recip. Pattern.  No gait belt today.    Recip. Stairs with light to no UE assist on handrails.  Pt. Benefits from use of 1 handrail for safety.    Sit to stands from low blue mat table 5x with no UE assist.    Modified tandem >30 sec. With good control.   Rebounder on tandem with NBOS and modified tandem.  SBA for safety and no LOB.    Agility ladder: coordination task to decrease fall risk and increase balance. 4 laps (down/back)- forward and lateral.     Rebounder on Airex with NBOS/ tandem (L and R).   Walking outside on grass/ uneven surfaces with mod. I and consistent step pattern/ heel strike.    PATIENT EDUCATION:  Education details: Reviewed HEP Person educated: Patient Education method: Explanation, Demonstration, and Handouts Education comprehension: verbalized understanding and returned demonstration   HOME EXERCISE PROGRAM: See handouts.   ASSESSMENT:  CLINICAL IMPRESSION: Pt. Demonstrates marked improvement in all PT goals and ability to return to golfing.  Pt. Independent with gait pattern on all surfaces with extra time/ focus on uneven terrain with use of prosthetic leg.  Discharge from PT at this time with focus on gym based exercise/ golfing.    OBJECTIVE IMPAIRMENTS Abnormal gait, decreased activity tolerance, decreased balance, decreased endurance, decreased mobility, difficulty walking, decreased strength, impaired flexibility, improper body mechanics, postural dysfunction, and pain.   ACTIVITY LIMITATIONS carrying, lifting, bending, standing, squatting, stairs, transfers, toileting, dressing, and locomotion level  PARTICIPATION LIMITATIONS: driving, community activity, and yard work  PERSONAL FACTORS Fitness and Past/current experiences are  also affecting patient's functional outcome.   REHAB POTENTIAL: Good  CLINICAL DECISION MAKING: Stable/uncomplicated  EVALUATION COMPLEXITY: Low   GOALS: Goals reviewed with patient? Yes  SHORT TERM GOALS: Target date: 09/27/22 Pt. Independent with HEP to increase B hip strength 1/2 muscle grade to improve standing/walking mod. Independence.   Baseline:  see above.  L hip flexion 4/5 MMT.  Goal status: Goal met    LONG TERM GOALS: Target date: 11/24/22  Pt. Will increase FOTO to 59 to improve functional mobility.   Baseline:  initial FOTO 40.  11/8: 68.  2/15: 78 Goal status: Goal met  2.  Pt. Able to ascend/descend 13 stairs with R handrail and consistent step pattern to improve pts. Ability to go to 2nd floor of home.  11/8: recip. Gait on stairs outside with R handrail for safety.   Baseline:  Goal status: Goal met  3.  Pt. Will ambulate with mod. I and SPC with consistent 2-point gait pattern and no loss of balance to improve independence at home/ community.   Baseline:  11/8: pt. Ambulates community distances Goal status: Goal met  4.  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    Baseline:  pt. Requires extra time/ SPC while walking outside and limited with carrying  Goal status: Goal met  5.  Pt. Able to return to playing round of golf with no limitations safely.    Baseline:  pt. Demonstrates chipping golf balls in gym with SBA/CGA  Goal status: Goal met   PLAN: PT FREQUENCY: 2x/week  PT DURATION: 4 weeks  PLANNED INTERVENTIONS: Therapeutic exercises, Therapeutic activity, Neuromuscular re-education, Balance training, Gait training, Patient/Family education, Self Care, Joint mobilization, Stair training, Prosthetic training, Manual therapy, and Re-evaluation  PLAN FOR NEXT SESSION: Discharge visit.  Pt. Instructed to contact PT if any questions or issues.    Ashlyn B. Rogers Blocker, SPT Pura Spice, PT, DPT #  [phone] 11/15/2022, 7:35 AM

## 2022-11-15 ENCOUNTER — Encounter: Payer: HMO | Admitting: Physical Therapy

## 2022-11-15 DIAGNOSIS — R002 Palpitations: Secondary | ICD-10-CM | POA: Diagnosis not present

## 2022-11-17 DIAGNOSIS — E782 Mixed hyperlipidemia: Secondary | ICD-10-CM | POA: Diagnosis not present

## 2022-11-17 DIAGNOSIS — E1169 Type 2 diabetes mellitus with other specified complication: Secondary | ICD-10-CM | POA: Diagnosis not present

## 2022-11-22 DIAGNOSIS — E119 Type 2 diabetes mellitus without complications: Secondary | ICD-10-CM | POA: Diagnosis not present

## 2022-11-22 DIAGNOSIS — Z86711 Personal history of pulmonary embolism: Secondary | ICD-10-CM | POA: Diagnosis not present

## 2022-11-22 DIAGNOSIS — E782 Mixed hyperlipidemia: Secondary | ICD-10-CM | POA: Diagnosis not present

## 2022-11-22 DIAGNOSIS — I2510 Atherosclerotic heart disease of native coronary artery without angina pectoris: Secondary | ICD-10-CM | POA: Diagnosis not present

## 2022-11-22 DIAGNOSIS — Z955 Presence of coronary angioplasty implant and graft: Secondary | ICD-10-CM | POA: Diagnosis not present

## 2022-11-22 DIAGNOSIS — R42 Dizziness and giddiness: Secondary | ICD-10-CM | POA: Diagnosis not present

## 2022-11-22 DIAGNOSIS — I471 Supraventricular tachycardia, unspecified: Secondary | ICD-10-CM | POA: Diagnosis not present

## 2022-11-22 DIAGNOSIS — I10 Essential (primary) hypertension: Secondary | ICD-10-CM | POA: Diagnosis not present

## 2022-11-25 DIAGNOSIS — I2699 Other pulmonary embolism without acute cor pulmonale: Secondary | ICD-10-CM | POA: Diagnosis not present

## 2022-11-25 DIAGNOSIS — T451X5A Adverse effect of antineoplastic and immunosuppressive drugs, initial encounter: Secondary | ICD-10-CM | POA: Diagnosis not present

## 2022-11-25 DIAGNOSIS — D6859 Other primary thrombophilia: Secondary | ICD-10-CM | POA: Diagnosis not present

## 2022-11-25 DIAGNOSIS — E1169 Type 2 diabetes mellitus with other specified complication: Secondary | ICD-10-CM | POA: Diagnosis not present

## 2022-11-25 DIAGNOSIS — Z89512 Acquired absence of left leg below knee: Secondary | ICD-10-CM | POA: Diagnosis not present

## 2022-11-25 DIAGNOSIS — C671 Malignant neoplasm of dome of bladder: Secondary | ICD-10-CM | POA: Diagnosis not present

## 2022-11-25 DIAGNOSIS — Z125 Encounter for screening for malignant neoplasm of prostate: Secondary | ICD-10-CM | POA: Diagnosis not present

## 2022-11-25 DIAGNOSIS — D692 Other nonthrombocytopenic purpura: Secondary | ICD-10-CM | POA: Diagnosis not present

## 2022-11-25 DIAGNOSIS — Z0000 Encounter for general adult medical examination without abnormal findings: Secondary | ICD-10-CM | POA: Diagnosis not present

## 2022-11-25 DIAGNOSIS — N1831 Chronic kidney disease, stage 3a: Secondary | ICD-10-CM | POA: Diagnosis not present

## 2022-11-25 DIAGNOSIS — G620 Drug-induced polyneuropathy: Secondary | ICD-10-CM | POA: Diagnosis not present

## 2022-11-25 DIAGNOSIS — E782 Mixed hyperlipidemia: Secondary | ICD-10-CM | POA: Diagnosis not present

## 2023-01-18 ENCOUNTER — Inpatient Hospital Stay (HOSPITAL_BASED_OUTPATIENT_CLINIC_OR_DEPARTMENT_OTHER): Payer: HMO | Admitting: Oncology

## 2023-01-18 ENCOUNTER — Inpatient Hospital Stay: Payer: HMO | Attending: Oncology

## 2023-01-18 ENCOUNTER — Encounter: Payer: Self-pay | Admitting: Oncology

## 2023-01-18 ENCOUNTER — Other Ambulatory Visit: Payer: Self-pay

## 2023-01-18 VITALS — BP 150/86 | HR 63 | Temp 97.7°F | Resp 18 | Wt 170.5 lb

## 2023-01-18 DIAGNOSIS — D75839 Thrombocytosis, unspecified: Secondary | ICD-10-CM | POA: Insufficient documentation

## 2023-01-18 DIAGNOSIS — Z7902 Long term (current) use of antithrombotics/antiplatelets: Secondary | ICD-10-CM | POA: Diagnosis not present

## 2023-01-18 DIAGNOSIS — Z7901 Long term (current) use of anticoagulants: Secondary | ICD-10-CM | POA: Insufficient documentation

## 2023-01-18 DIAGNOSIS — D471 Chronic myeloproliferative disease: Secondary | ICD-10-CM

## 2023-01-18 DIAGNOSIS — D508 Other iron deficiency anemias: Secondary | ICD-10-CM | POA: Diagnosis not present

## 2023-01-18 DIAGNOSIS — Z86718 Personal history of other venous thrombosis and embolism: Secondary | ICD-10-CM | POA: Diagnosis present

## 2023-01-18 DIAGNOSIS — Z8551 Personal history of malignant neoplasm of bladder: Secondary | ICD-10-CM | POA: Insufficient documentation

## 2023-01-18 DIAGNOSIS — C674 Malignant neoplasm of posterior wall of bladder: Secondary | ICD-10-CM | POA: Diagnosis not present

## 2023-01-18 DIAGNOSIS — D509 Iron deficiency anemia, unspecified: Secondary | ICD-10-CM | POA: Insufficient documentation

## 2023-01-18 DIAGNOSIS — Z86711 Personal history of pulmonary embolism: Secondary | ICD-10-CM | POA: Insufficient documentation

## 2023-01-18 DIAGNOSIS — D72829 Elevated white blood cell count, unspecified: Secondary | ICD-10-CM | POA: Insufficient documentation

## 2023-01-18 DIAGNOSIS — I82409 Acute embolism and thrombosis of unspecified deep veins of unspecified lower extremity: Secondary | ICD-10-CM | POA: Diagnosis not present

## 2023-01-18 LAB — COMPREHENSIVE METABOLIC PANEL
ALT: 15 U/L (ref 0–44)
AST: 18 U/L (ref 15–41)
Albumin: 3.8 g/dL (ref 3.5–5.0)
Alkaline Phosphatase: 53 U/L (ref 38–126)
Anion gap: 6 (ref 5–15)
BUN: 19 mg/dL (ref 8–23)
CO2: 25 mmol/L (ref 22–32)
Calcium: 8.8 mg/dL — ABNORMAL LOW (ref 8.9–10.3)
Chloride: 106 mmol/L (ref 98–111)
Creatinine, Ser: 1.4 mg/dL — ABNORMAL HIGH (ref 0.61–1.24)
GFR, Estimated: 52 mL/min — ABNORMAL LOW (ref >60)
Glucose, Bld: 114 mg/dL — ABNORMAL HIGH (ref 70–99)
Potassium: 3.6 mmol/L (ref 3.5–5.1)
Sodium: 137 mmol/L (ref 135–145)
Total Bilirubin: 0.9 mg/dL (ref 0.3–1.2)
Total Protein: 6.8 g/dL (ref 6.5–8.1)

## 2023-01-18 LAB — CBC WITH DIFFERENTIAL/PLATELET
Abs Immature Granulocytes: 0.04 10*3/uL (ref 0.00–0.07)
Basophils Absolute: 0.1 10*3/uL (ref 0.0–0.1)
Basophils Relative: 1 %
Eosinophils Absolute: 0.1 10*3/uL (ref 0.0–0.5)
Eosinophils Relative: 2 %
HCT: 46.1 % (ref 39.0–52.0)
Hemoglobin: 15.2 g/dL (ref 13.0–17.0)
Immature Granulocytes: 1 %
Lymphocytes Relative: 18 %
Lymphs Abs: 1.3 10*3/uL (ref 0.7–4.0)
MCH: 34.4 pg — ABNORMAL HIGH (ref 26.0–34.0)
MCHC: 33 g/dL (ref 30.0–36.0)
MCV: 104.3 fL — ABNORMAL HIGH (ref 80.0–100.0)
Monocytes Absolute: 0.5 10*3/uL (ref 0.1–1.0)
Monocytes Relative: 7 %
Neutro Abs: 5.3 10*3/uL (ref 1.7–7.7)
Neutrophils Relative %: 71 %
Platelets: 259 10*3/uL (ref 150–400)
RBC: 4.42 MIL/uL (ref 4.22–5.81)
RDW: 13.8 % (ref 11.5–15.5)
WBC: 7.4 10*3/uL (ref 4.0–10.5)
nRBC: 0 % (ref 0.0–0.2)

## 2023-01-18 LAB — IRON AND TIBC
Iron: 66 ug/dL (ref 45–182)
Saturation Ratios: 25 % (ref 17.9–39.5)
TIBC: 265 ug/dL (ref 250–450)
UIBC: 199 ug/dL

## 2023-01-18 LAB — FERRITIN: Ferritin: 21 ng/mL — ABNORMAL LOW (ref 24–336)

## 2023-01-18 LAB — RETIC PANEL
Immature Retic Fract: 17.3 % — ABNORMAL HIGH (ref 2.3–15.9)
RBC.: 4.45 MIL/uL (ref 4.22–5.81)
Retic Count, Absolute: 110.4 10*3/uL (ref 19.0–186.0)
Retic Ct Pct: 2.5 % (ref 0.4–3.1)
Reticulocyte Hemoglobin: 35.8 pg (ref >27.9)

## 2023-01-18 NOTE — Progress Notes (Signed)
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 Also on Plavix.    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 He tolerates hydroxyurea well. Platelet is wnl Continue Hydroxyurea 500mg  daily.  Refill sent to pharmacy.   Iron deficiency anemia Iron panel has improved, still low.  Lab Results  Component Value Date   IRON 66 01/18/2023   TIBC 265 01/18/2023   IRONPCTSAT 25 01/18/2023   FERRITIN 21 (L) 01/18/2023     Recommend patient to continue Vitron C 2-3 times per week.   No orders of the defined types were placed in this encounter.  Follow up  3 months lab MD  All questions were answered. The patient knows to call the clinic with any problems, questions or concerns.  [name], MD, PhD [facility] Hematology Oncology 01/18/2023      Pertinent hematology oncology history   Patient has significant history of previous DVT and PE.   Patient was seen by me in March 2020 and declined further follow-up. 2013 history of prostate cancer treated with cryo therapy  01/03/2016 history of superficial venous thrombosis demonstrated in the greater saphenous vein-.  Also history of superficial thrombophlebitis of segment  of left the basal leg vein in the upper arm-08/24/2017.  No anticoagulation was recommended at that time due to the SVT features. 11/25/2018, right lower extremity acute DVT, started on Lovenox for about a  week, 12/05/2018, patient underwent TURBT and intravesical instillation of gemcitabine for noninvasive low-grade papillary urothelial carcinoma.  Lovenox was held for 2 days prior and for 24 hours after procedure, and also started on Eliquis 12/15/2018, developed acute nonocclusive segmental and subsegmental pulmonary emboli within the right upper lobe, right middle lobe, right lower lobe pulmonary branches.  Patient was admitted to the hospital and started on heparin drip.  Patient was seen by me during his admission at that time.  Lovenox 1 mg/kg twice daily was recommended at discharge.  He declined further follow-up with me in the clinic.   His anticoagulation was managed by primary care provider Dr. Hyacinth Meeker.  Patient was on Coumadin for anticoagulation until Feb 2023.  Patient did not check INR since September 2022.  Patient also developed supratherapeutic INR due to interaction between Coumadin and Diflucan for treatment of esophageal candidiasis.  Patient was off Coumadin on 11/17/2021.  11/21/2021 developed acute nonocclusive DVT, started on Lovenox decision was made to switch to Eliquis.   03/09/2022, artificial urinary sphincter placement.  His Eliquis was held for 1 week prior to the procedure.  He has noticed left lower extremity pain 1 to 2 days after holding Eliquis. 03/11/2022, ER visit due to progressively worsening left lower extremity pain. CT angio aortobifemoral with and without contrast showed eccentric thrombus in the left outflow vessels with distal occlusion of left renal vessels.  Delayed filling of the right popliteal artery is concerning for underlying occlusions and thromboembolic disease in the right runoff vessels Started on heparin drip in ED.  03/11/2022, vascular surgeon lower extremity bypass surgery 03/12/2022, severe left lower extremity pain, return to the OR for left lower extremity thrombectomy.  Continued on heparin drip. 03/16/2022, left lower extremity extremity  nonsalvageable.  Status post BKA.  Per my discussion with pharmacy, heparin was held around 9 AM on 03/16/2022 and resumed on 03/17/2022 9:30 PM. . 03/17/2022, acute respiratory failure, CT chest angiogram showed bilateral central pulm emboli involving the main pulmonary arteries and extending into all lobar branches with nearly occlusive clot to the right lower lobe, left upper lobe, several segmental branches.  CT evidence of right heart strain.Mildly increased sclerotic focus    03/18/2022, thrombolysis, mechanical thrombectomy.  Heparin gtt restarted after procedure. 03/19/2022, heparin gtt was continued. 03/19/2022, bilateral lower extremity ultrasound showed thrombosis of right femoral popliteal and calf veins.  Partially thrombosed left profunda femoris vein. 03/19/2022 IVC filter placement. 03/20/2022, still have significant hypoxia.  On heparin gtt. 03/21/2022 -03/22/2022, on  heparin gtt. 03/23/2022, CT angio chest.showed increased clot burden within right pulmonary artery and the right lower lobe pulmonary emboli with similar clot burden in the right middle lobe, right upper lobe, left lower lobe.  Single residual fibrin strand/clot at the bifurcation of the pulmonary artery with decreased clot burden in the left upper lobe artery branches.  Persistent right heart strain.  Findings suspicious for developing pulmonary infarcts. 03/23/2022, mechanical embolectomy of pulmonary arteries as well as right external iliac vein, common iliac vein and inferior vena cava.   Given that patient developed recurrent PE/DVT despite being therapeutic on heparin GTT, post thrombectomy, patient was switched to Angiomax and later started on Argatroban drip  Patient had  hypercoagulable work-up done during the admission. Work-up showed negative factor V Leiden mutation, prothrombin gene imaging.  Negative anticardiolipin antibodies, lupus anticoagulants.  Negative beta-2 glycoprotein antibodies Positive JAK2 V6 38F  mutation Patient was discharged on Eliquis.     06/01/22 Bone marrow biopsy showed  BONE MARROW, ASPIRATE, CLOT, CORE:  -Hypercellular marrow (60%) involved by a JAK2 mutated myeloproliferative neoplasm (see comment) Comment: The overall findings are consistent with a myeloproliferative neoplasm. Diagnostic considerations include essential thrombocythemia, polycythemia vera and pre- fibrotic primary myelofibrosis. Importantly, there is no overt increase in blasts or fibrosis.  However, correlation with erythropoietin levels, iron studies, presence/absence of splenomegaly and serum LDH along with cytogenetic studies would be  helpful for a more definitive assessment.   PERIPHERAL BLOOD:   -Thrombocytosis  - Mild leukocytosis  - Normocytic normochromic anemia   Cytogenetics normal   INTERVAL HISTORY Phillip [name] is a 77 y.o. male who has above history reviewed by me today presents for follow up visit for  recurrent thrombosis, Jak2 V638F mutation myeloproliferative disease  Patient tolerates Hydroxyurea 500 mg daily..  No new complaints. He has left lower extremity prosthetics.  Status post IVC filter removal.  Patient is on anticoagulation with Eliquis and Plavix.  Tolerates well.  No bleeding events.   MEDICAL HISTORY:  Past Medical History:  Diagnosis Date   Arthritis    Basal cell carcinoma    L clavicle, L prox forearm- removed years ago    Bladder cancer (HCC)    Bladder neck contracture    CAD (coronary artery disease)    a. 12/2020 NSTEMI/Cath: LM nl, LAD 95ost/p, LCX 50p, RCA nl. EF 45-50%.   Cataract    CKD (chronic kidney disease), stage III [facility])    ED (erectile dysfunction)    Frequency    GERD (gastroesophageal reflux disease)  History of pulmonary embolus (PE) 11/2018   a. Following LE DVT-->Chronic warfarin.   Hyperlipidemia LDL goal <70    Hypertension    Hypothyroidism    Incontinence of urine    sui, s/p cryoablation   Ischemic cardiomyopathy     a. 11/2018 Echo: EF 60-65%; b. 12/2020 LV Gram: EF 45-50% in setting of NSTEMI.   Kidney stones    Neuropathy    feet   Nocturia    Prostate cancer (HCC)    S/P   CRYOABLATION   Right elbow tendinitis    Right Lower Extremity DVT (deep venous thrombosis) (HCC) 11/25/2018   Vertigo    1-2x/yr   Wears glasses     SURGICAL HISTORY: Past Surgical History:  Procedure Laterality Date   AMPUTATION Left 03/16/2022   Procedure: AMPUTATION BELOW KNEE;  Surgeon: Renford Dills, MD;  Location: [facility] ORS;  Service: Vascular;  Laterality: Left;   ARTHROPLASTY AND TENDON REPAIR LEFT THUMB  JAN 2014   CATARACT EXTRACTION W/PHACO Left 11/16/2020   Procedure: CATARACT EXTRACTION PHACO AND INTRAOCULAR LENS PLACEMENT (IOC) LEFT  7.09 00:56.4 12.6%;  Surgeon: Lockie Mola, MD;  Location: [facility];  Service: Ophthalmology;  Laterality: Left;   CATARACT EXTRACTION W/PHACO Right 12/02/2020   Procedure: CATARACT EXTRACTION PHACO AND INTRAOCULAR LENS PLACEMENT (IOC) RIGHT MALYUGIN 5.52 00:49.7 11.1%;  Surgeon: Lockie Mola, MD;  Location: [facility];  Service: Ophthalmology;  Laterality: Right;   COLONOSCOPY     COLONOSCOPY WITH PROPOFOL N/A 08/09/2017   Procedure: COLONOSCOPY WITH PROPOFOL;  Surgeon: Scot Jun, MD;  Location: [facility] ENDOSCOPY;  Service: Endoscopy;  Laterality: N/A;   CORONARY ANGIOGRAPHY N/A 12/28/2020   Procedure: CORONARY ANGIOGRAPHY;  Surgeon: Tonny Bollman, MD;  Location: [facility] INVASIVE CV LAB;  Service: Cardiovascular;  Laterality: N/A;   CORONARY STENT INTERVENTION N/A 12/28/2020   Procedure: CORONARY STENT INTERVENTION;  Surgeon: Tonny Bollman, MD;  Location[facility] INVASIVE CV LAB;  Service: Cardiovascular;  Laterality: N/A;   ESOPHAGOGASTRODUODENOSCOPY (EGD) WITH PROPOFOL N/A 08/09/2017   Procedure: ESOPHAGOGASTRODUODENOSCOPY (EGD) WITH PROPOFOL;  Surgeon: Scot Jun, MD;  Location: [facility] ENDOSCOPY;  Service: Endoscopy;  Laterality: N/A;    GREEN LIGHT LASER TURP (TRANSURETHRAL RESECTION OF PROSTATE N/A 12/14/2015   Procedure: GREEN LIGHT LASER TURP (TRANSURETHRAL RESECTION OF PROSTATE) LASER OF BLADDER NECK CONTRACTURE;  Surgeon: Jethro Bolus, MD;  Location: [facility];  Service: Urology;  Laterality: N/A;   IVC FILTER INSERTION N/A 03/19/2022   Procedure: IVC FILTER INSERTION;  Surgeon: Nada Libman, MD;  Location: [facility] INVASIVE CV LAB;  Service: Cardiovascular;  Laterality: N/A;   IVC FILTER REMOVAL N/A 11/01/2022   Procedure: IVC FILTER REMOVAL;  Surgeon: Renford Dills, MD;  Location: [facility] INVASIVE CV LAB;  Service: Cardiovascular;  Laterality: N/A;   LEFT HEART CATH AND CORONARY ANGIOGRAPHY N/A 12/24/2020   Procedure: LEFT HEART CATH AND CORONARY ANGIOGRAPHY and possible PCI and stent;  Surgeon: Alwyn Pea, MD;  Location: [facility] INVASIVE CV LAB;  Service: Cardiovascular;  Laterality: N/A;   LEFT HEART CATH AND CORONARY ANGIOGRAPHY N/A 01/06/2021   Procedure: LEFT HEART CATH AND CORONARY ANGIOGRAPHY;  Surgeon: Lennette Bihari, MD;  Location: [facility] INVASIVE CV LAB;  Service: Cardiovascular;  Laterality: N/A;   LOWER EXTREMITY ANGIOGRAPHY Left 03/11/2022   Procedure: Lower Extremity Angiography;  Surgeon: Renford Dills, MD;  Location: [facility] INVASIVE CV LAB;  Service: Cardiovascular;  Laterality: Left;   PENILE PROSTHESIS IMPLANT N/A 12/06/2013   Procedure: PENILE PROTHESIS  INFLATABLE;  Surgeon: Kathi Ludwig, MD;  Location: [facility];  Service: Urology;  Laterality: N/A;   PROSTATE CRYOABLATION  06-01-2012  [facility]   PULMONARY THROMBECTOMY N/A 03/18/2022   Procedure: PULMONARY THROMBECTOMY;  Surgeon: Annice Needy, MD;  Location: [facility] INVASIVE CV LAB;  Service: Cardiovascular;  Laterality: N/A;   PULMONARY THROMBECTOMY Bilateral 03/23/2022   Procedure: PULMONARY THROMBECTOMY;  Surgeon: Renford Dills, MD;  Location: [facility] INVASIVE CV LAB;  Service: Cardiovascular;  Laterality:  Bilateral;   THROMBECTOMY ILIAC ARTERY Left 03/12/2022   Procedure: THROMBECTOMY LEG;  Surgeon: Fransisco Hertz, MD;  Location: [facility] OR[facility];  Service: Vascular;  Laterality: Left;   TRANSURETHRAL RESECTION OF BLADDER NECK N/A 03/20/2015   Procedure: RELEASE BLADDER NECK CONTRACTURE  WITH WOLF BUTTON ELECTRODE ;  Surgeon: Jethro Bolus, MD;  Location: [facility];  Service: Urology;  Laterality: N/A;   TRANSURETHRAL RESECTION OF BLADDER TUMOR N/A 12/05/2018   Procedure: TRANSURETHRAL RESECTION OF BLADDER TUMOR (TURBT)/CYSTOSCOPY/  INSTILLATION OF Michelle Piper;  Surgeon: Rene Paci, MD;  Location[facility];  Service: Urology;  Laterality: N/A;   TRANSURETHRAL RESECTION OF BLADDER TUMOR N/A 01/15/2020   Procedure: TRANSURETHRAL RESECTION OF BLADDER TUMOR (TURBT)/ CYSTOSCOPY;  Surgeon: Rene Paci, MD;  Location: [facility];  Service: Urology;  Laterality: N/A;   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      SOCIAL HISTORY: Social History   Socioeconomic History   Marital status: Married    Spouse name: Lawernce Earll   Number of children: 3   Years of education: Not on file   Highest education level: Not on file  Occupational History   Not on file  Tobacco Use   Smoking status: Never    Passive exposure: Never   Smokeless tobacco: Never  Vaping Use   Vaping Use: Never used  Substance and Sexual Activity   Alcohol use: No   Drug use: No   Sexual activity: Not Currently    Birth control/protection: None  Other Topics Concern   Not on file  Social History Narrative   Lives locally with wife.  Exercises regularly - gym/golf.  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   3 sons: 1 is a Engineer, civil (consulting).    Social Determinants of Health   Financial Resource Strain: Not on file  Food Insecurity: No Food Insecurity (09/20/2022)   Hunger Vital Sign    Worried About Running  Out of Food in the Last Year: Never true    Ran Out of Food in the Last Year: Never true  Transportation Needs: No Transportation Needs (09/20/2022)   PRAPARE - Administrator, Civil Service (Medical): No    Lack of Transportation (Non-Medical): No  Physical Activity: Not on file  Stress: Not on file  Social Connections: Not on file  Intimate Partner Violence: Not on file    FAMILY HISTORY: Family History  Problem Relation Age of Onset   CAD Father 86    ALLERGIES:  is allergic to doxazosin.  MEDICATIONS:  Current Outpatient Medications  Medication Sig Dispense Refill   acetaminophen (TYLENOL) 500 MG tablet Take 500 mg by mouth every 6 (six) hours as needed.     apixaban (ELIQUIS) 5 MG TABS tablet Take 1 tablet (5 mg total) by mouth 2 (two) times daily. 60 tablet 0   ascorbic acid (VITAMIN C) 500 MG tablet Take 500 mg by mouth  daily.     cetirizine (ZYRTEC) 10 MG tablet Take 10 mg by mouth daily.     clopidogrel (PLAVIX) 75 MG tablet Take 1 tablet (75 mg total) by mouth daily with breakfast. 90 tablet 3   co-enzyme Q-10 30 MG capsule Take 30 mg by mouth daily.     hydrochlorothiazide (HYDRODIURIL) 25 MG tablet Take 25 mg by mouth daily as needed.     hydroxyurea (HYDREA) 500 MG capsule TAKE 1 CAPSULE BY MOUTH EVERY DAY. MAY TAKE WITH FOOD TO MINIMIZE GI SIDE EFFECTS. 90 capsule 3   Iron-Vitamin C (VITRON-C) 65-125 MG TABS Take 1 tablet by mouth daily. 90 tablet 1   metoprolol tartrate (LOPRESSOR) 25 MG tablet Take 0.5 tablets (12.5 mg total) by mouth 2 (two) times daily. 60 tablet 0   Multiple Vitamin (MULTIVITAMIN) tablet Take 1 tablet by mouth daily.     pantoprazole (PROTONIX) 40 MG tablet Take 1 tablet (40 mg total) by mouth daily. 90 tablet 3   pravastatin (PRAVACHOL) 40 MG tablet Take 40 mg by mouth daily.     TRUE METRIX BLOOD GLUCOSE TEST test strip SMARTSIG:Via Meter     Turmeric (QC TUMERIC COMPLEX PO) Take by mouth.     levothyroxine (SYNTHROID) 50 MCG tablet  Take 50 mcg by mouth.     nitroGLYCERIN (NITROSTAT) 0.4 MG SL tablet Place 1 tablet (0.4 mg total) under the tongue every 5 (five) minutes x 3 doses as needed for chest pain. (Patient not taking: Reported on 01/18/2023) 25 tablet 3   traMADol (ULTRAM) 50 MG tablet Take 50 mg by mouth every 6 (six) hours as needed for mild pain. (Patient not taking: Reported on 01/18/2023)     No current facility-administered medications for this visit.    Review of Systems  Constitutional:  Negative for appetite change, chills, fatigue, fever and unexpected weight change.  HENT:   Negative for hearing loss and voice change.   Eyes:  Negative for eye problems and icterus.  Respiratory:  Negative for chest tightness, cough and shortness of breath.   Cardiovascular:  Negative for chest pain and leg swelling.  Gastrointestinal:  Negative for abdominal distention and abdominal pain.  Endocrine: Negative for hot flashes.  Genitourinary:  Negative for difficulty urinating, dysuria and frequency.   Musculoskeletal:  Negative for arthralgias.       Left BKA  Skin:  Negative for itching and rash.  Neurological:  Negative for light-headedness and numbness.  Hematological:  Negative for adenopathy. Does not bruise/bleed easily.  Psychiatric/Behavioral:  Negative for confusion.    PHYSICAL EXAMINATION:  Vitals:   01/18/23 1448  BP: (!) 150/86  Pulse: 63  Resp: 18  Temp: 97.7 F (36.5 C)   Filed Weights   01/18/23 1448  Weight: 170 lb 8 oz (77.3 kg)    Physical Exam Constitutional:      General: He is not in acute distress.    Comments: He walks with cane.   HENT:     Head: Normocephalic and atraumatic.  Eyes:     General: No scleral icterus. Cardiovascular:     Rate and Rhythm: Normal rate.  Pulmonary:     Effort: Pulmonary effort is normal. No respiratory distress.     Breath sounds: No wheezing.  Abdominal:     General: Bowel sounds are normal. There is no distension.     Palpations: Abdomen is  soft.  Musculoskeletal:        General: Normal range of motion.  Cervical back: Normal range of motion and neck supple.     Comments: Status post left BKA, + left lower limb prosthetics.   Skin:    General: Skin is warm and dry.     Findings: No erythema or rash.  Neurological:     Mental Status: He is alert and oriented to person, place, and time. Mental status is at baseline.     Cranial Nerves: No cranial nerve deficit.     Coordination: Coordination normal.  Psychiatric:        Mood and Affect: Mood normal.     LABORATORY DATA:  I have reviewed the data as listed    Latest Ref Rng & Units 01/18/2023   10:17 AM 10/20/2022   10:13 AM 08/31/2022   10:49 AM  CBC  WBC 4.0 - 10.5 K/uL 7.4  12.8  6.6   Hemoglobin 13.0 - 17.0 g/dL 16.1  09.6  04.5   Hematocrit 39.0 - 52.0 % 46.1  48.8  46.5   Platelets 150 - 400 K/uL 259  351  315       Latest Ref Rng & Units 01/18/2023   10:17 AM 10/20/2022   10:13 AM 08/22/2022    4:45 AM  CMP  Glucose 70 - 99 mg/dL 409  811  914   BUN 8 - 23 mg/dL 19  24  20    Creatinine 0.61 - 1.24 mg/dL 7.82  9.56  2.13   Sodium 135 - 145 mmol/L 137  140  142   Potassium 3.5 - 5.1 mmol/L 3.6  4.0  3.6   Chloride 98 - 111 mmol/L 106  108  110   CO2 22 - 32 mmol/L 25  24  29    Calcium 8.9 - 10.3 mg/dL 8.8  8.8  9.1   Total Protein 6.5 - 8.1 g/dL 6.8  7.4  7.3   Total Bilirubin 0.3 - 1.2 mg/dL 0.9  0.6  0.8   Alkaline Phos 38 - 126 U/L 53  60  47   AST 15 - 41 U/L 18  19  20    ALT 0 - 44 U/L 15  20  20        RADIOGRAPHIC STUDIES: I have personally reviewed the radiological images as listed and agreed with the findings in the report. PERIPHERAL VASCULAR CATHETERIZATION  Result Date: 11/01/2022 See surgical note for result.

## 2023-01-18 NOTE — Assessment & Plan Note (Addendum)
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 He tolerates hydroxyurea well. Platelet is wnl Continue Hydroxyurea 500mg  daily.  Refill sent to pharmacy.

## 2023-01-18 NOTE — Assessment & Plan Note (Signed)
Non invasive bladder cancer s/p  tx'd with TURBT in 2020 and then BCG, follow up with urology 

## 2023-01-18 NOTE — Assessment & Plan Note (Signed)
Iron panel has improved, still low.  Lab Results  Component Value Date   IRON 66 01/18/2023   TIBC 265 01/18/2023   IRONPCTSAT 25 01/18/2023   FERRITIN 21 (L) 01/18/2023     Recommend patient to continue Vitron C 2-3 times per week.

## 2023-01-18 NOTE — Assessment & Plan Note (Signed)
Recurrent DVT and embolism, s/p IVC filter, s/p IVC filter removal  Continue long term anticoagulation with Eliquis 5mg  BID [ patient gets refills through PCP's office]. Also on Plavix.

## 2023-01-26 ENCOUNTER — Ambulatory Visit (INDEPENDENT_AMBULATORY_CARE_PROVIDER_SITE_OTHER): Payer: HMO | Admitting: Urology

## 2023-01-26 ENCOUNTER — Encounter: Payer: Self-pay | Admitting: Urology

## 2023-01-26 ENCOUNTER — Other Ambulatory Visit: Payer: Medicare HMO | Admitting: Urology

## 2023-01-26 VITALS — BP 163/91 | HR 66 | Ht 68.5 in | Wt 170.5 lb

## 2023-01-26 DIAGNOSIS — Z8551 Personal history of malignant neoplasm of bladder: Secondary | ICD-10-CM | POA: Diagnosis not present

## 2023-01-26 MED ORDER — CEPHALEXIN 250 MG PO CAPS
500.0000 mg | ORAL_CAPSULE | Freq: Once | ORAL | Status: AC
Start: 2023-01-26 — End: 2023-01-26
  Administered 2023-01-26: 500 mg via ORAL

## 2023-01-26 NOTE — Progress Notes (Signed)
Cystoscopy Procedure Note:  Indication: Hx LG Ta urothelial cell carcinoma  77 year old very complex male with extensive prior urologic history, previously followed by [facility] as well as by Dr. Patsi Sears and Dr. Liliane Shi in [place].  Cryoablation ~2013 for prostate cancer, this has been complicated by bladder neck contracture, incontinence, ED, and he currently has a penile prosthesis as well as an artificial urinary sphincter.  History of 1.5 cm papillary bladder tumor treated with TURBT and gemcitabine by Dr. Liliane Shi in March 2020, no recurrences since that time.  He also had a benign lesion at the bladder neck resected by Dr. Liliane Shi in April 2021.  History of low testosterone, had massive PE in June 2023 requiring numerous procedures as well as lower extremity amputation, would not recommend any further testosterone replacement.  From a prostate cancer perspective, PSA has remained very low, most recently undetectable in August 2023.  We reviewed guidelines that recommend considering discontinuing PSA monitoring when greater than 10 years out from definitive treatment, he would like to continue yearly PSA check.  Keflex given for prophylaxis  After informed consent and discussion of the procedure and its risks, Phillip [name] was positioned and prepped in the standard fashion.  He cycled his artificial sphincter to allow access to the bladder.  Cystoscopy was performed with a flexible cystoscope. The urethra, bladder neck and entire bladder was visualized in a standard fashion.  No evidence of erosion of artificial sphincter or penile prosthesis.  Wide open prostatic fossa from prior procedures.  The ureteral orifices were visualized in their normal location and orientation.  No suspicious lesions, mild trabeculations.  No abnormalities on retroflexion.  Findings: No evidence of recurrence  Assessment and Plan: RTC 1 year cystoscopy for LG Ta, consider discontinuing surveillance cystoscopy  after next year(5 years total per AUA guidelines)  Legrand Rams, MD 01/26/2023

## 2023-03-29 ENCOUNTER — Telehealth (INDEPENDENT_AMBULATORY_CARE_PROVIDER_SITE_OTHER): Payer: Self-pay | Admitting: Vascular Surgery

## 2023-03-29 ENCOUNTER — Ambulatory Visit: Payer: HMO | Admitting: Dermatology

## 2023-03-29 NOTE — Telephone Encounter (Signed)
Can we reach out to [facility] and Judy Pimple to see the exact wordage needed

## 2023-03-29 NOTE — Telephone Encounter (Signed)
I spoke with Casimiro Needle and informed that the patient will need to schedule an appointment to be seen

## 2023-03-29 NOTE — Telephone Encounter (Signed)
Patient called in wanting to know if we can send in Rx to [facility] for his permanent leg. He's requesting a " athletic ankle" . When you reach out to [facility] ask for Phillip [name].     Please call and advise if need more info

## 2023-04-03 ENCOUNTER — Ambulatory Visit (INDEPENDENT_AMBULATORY_CARE_PROVIDER_SITE_OTHER): Payer: HMO | Admitting: Nurse Practitioner

## 2023-04-03 ENCOUNTER — Encounter (INDEPENDENT_AMBULATORY_CARE_PROVIDER_SITE_OTHER): Payer: Self-pay | Admitting: Nurse Practitioner

## 2023-04-03 VITALS — BP 131/75 | HR 61 | Resp 17 | Ht 68.0 in | Wt 166.8 lb

## 2023-04-03 DIAGNOSIS — G629 Polyneuropathy, unspecified: Secondary | ICD-10-CM

## 2023-04-03 DIAGNOSIS — I7090 Unspecified atherosclerosis: Secondary | ICD-10-CM | POA: Diagnosis not present

## 2023-04-03 DIAGNOSIS — Z89512 Acquired absence of left leg below knee: Secondary | ICD-10-CM | POA: Diagnosis not present

## 2023-04-03 DIAGNOSIS — I10 Essential (primary) hypertension: Secondary | ICD-10-CM | POA: Diagnosis not present

## 2023-04-03 MED ORDER — APIXABAN 5 MG PO TABS: 5.0000 mg | ORAL_TABLET | Freq: Two times a day (BID) | ORAL | 0 refills | Status: DC

## 2023-04-03 NOTE — Progress Notes (Signed)
Subjective:    Patient ID: Phillip [name], male    DOB: 03-02-1946, 77 y.o.   MRN: [id number] Chief Complaint  Patient presents with   Venous Insufficiency    Phillip [name] is a 77 year old male that presents today for evaluation regarding changes to his prosthetic limb.  The patient is able to maneuver adequately currently with his below-knee prosthetic however he has been very active and he is able to walk across a multitude of different surface areas.  His lack of an immobile ankle joint makes maneuvering some of these terrains difficult.  He denies any claudication-like symptoms but does note having some numbness in his right lower extremity.  He did have significant pain in this limb however after some treatment the pain is gone but he still has residual numbness.  Currently no rest pain or ulcerations present.    Review of Systems  Musculoskeletal:  Positive for gait problem.  Skin:  Negative for wound.  Neurological:  Positive for numbness.  All other systems reviewed and are negative.      Objective:   Physical Exam Vitals reviewed.  HENT:     Head: Normocephalic.  Cardiovascular:     Rate and Rhythm: Normal rate.  Pulmonary:     Effort: Pulmonary effort is normal.  Musculoskeletal:     Left Lower Extremity: Left leg is amputated below knee.  Skin:    General: Skin is warm and dry.  Neurological:     Mental Status: He is alert and oriented to person, place, and time.  Psychiatric:        Mood and Affect: Mood normal.        Behavior: Behavior normal.        Thought Content: Thought content normal.        Judgment: Judgment normal.     BP 131/75 (BP Location: Left Arm)   Pulse 61   Resp 17   Ht 5\' 8"  (1.727 m)   Wt 166 lb 12.8 oz (75.7 kg)   BMI 25.36 kg/m   Past Medical History:  Diagnosis Date   Arthritis    Basal cell carcinoma    L clavicle, L prox forearm- removed years ago    Bladder cancer (HCC)    Bladder neck contracture    CAD (coronary  artery disease)    a. 12/2020 NSTEMI/Cath: LM nl, LAD 95ost/p, LCX 50p, RCA nl. EF 45-50%.   Cataract    CKD (chronic kidney disease), stage III [facility])    ED (erectile dysfunction)    Frequency    GERD (gastroesophageal reflux disease)    History of pulmonary embolus (PE) 11/2018   a. Following LE DVT-->Chronic warfarin.   Hyperlipidemia LDL goal <70    Hypertension    Hypothyroidism    Incontinence of urine    sui, s/p cryoablation   Ischemic cardiomyopathy    a. 11/2018 Echo: EF 60-65%; b. 12/2020 LV Gram: EF 45-50% in setting of NSTEMI.   Kidney stones    Neuropathy    feet   Nocturia    Prostate cancer (HCC)    S/P   CRYOABLATION   Right elbow tendinitis    Right Lower Extremity DVT (deep venous thrombosis) (HCC) 11/25/2018   Vertigo    1-2x/yr   Wears glasses     Social History   Socioeconomic History   Marital status: Married    Spouse name: Esaul Dorwart   Number of children: 3   Years of education: Not on  file   Highest education level: Not on file  Occupational History   Not on file  Tobacco Use   Smoking status: Never    Passive exposure: Never   Smokeless tobacco: Never  Vaping Use   Vaping status: Never Used  Substance and Sexual Activity   Alcohol use: No   Drug use: No   Sexual activity: Not Currently    Birth control/protection: None  Other Topics Concern   Not on file  Social History Narrative   Lives locally with wife.  Exercises regularly - gym/golf.  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   3 sons: 1 is a Engineer, civil (consulting).    Social Determinants of Health   Financial Resource Strain: Not on file  Food Insecurity: No Food Insecurity (09/20/2022)   Hunger Vital Sign    Worried About Running Out of Food in the Last Year: Never true    Ran Out of Food in the Last Year: Never true  Transportation Needs: No Transportation Needs (09/20/2022)   PRAPARE - Administrator, Civil Service (Medical): No    Lack of  Transportation (Non-Medical): No  Physical Activity: Not on file  Stress: Not on file  Social Connections: Not on file  Intimate Partner Violence: Not on file    Past Surgical History:  Procedure Laterality Date   AMPUTATION Left 03/16/2022   Procedure: AMPUTATION BELOW KNEE;  Surgeon: Renford Dills, MD;  Location: [facility];  Service: Vascular;  Laterality: Left;   ARTHROPLASTY AND TENDON REPAIR LEFT THUMB  JAN 2014   CATARACT EXTRACTION W/PHACO Left 11/16/2020   Procedure: CATARACT EXTRACTION PHACO AND INTRAOCULAR LENS PLACEMENT (IOC) LEFT  7.09 00:56.4 12.6%;  Surgeon: Lockie Mola, MD;  Location: [facility];  Service: Ophthalmology;  Laterality: Left;   CATARACT EXTRACTION W/PHACO Right 12/02/2020   Procedure: CATARACT EXTRACTION PHACO AND INTRAOCULAR LENS PLACEMENT (IOC) RIGHT MALYUGIN 5.52 00:49.7 11.1%;  Surgeon: Lockie Mola, MD;  Location: [facility];  Service: Ophthalmology;  Laterality: Right;   COLONOSCOPY     COLONOSCOPY WITH PROPOFOL N/A 08/09/2017   Procedure: COLONOSCOPY WITH PROPOFOL;  Surgeon: Scot Jun, MD;  Location: [facility]COPY;  Service: Endoscopy;  Laterality: N/A;   CORONARY ANGIOGRAPHY N/A 12/28/2020   Procedure: CORONARY ANGIOGRAPHY;  Surgeon: Tonny Bollman, MD;  Location: [facility] INVASIVE CV LAB;  Service: Cardiovascular;  Laterality: N/A;   CORONARY STENT INTERVENTION N/A 12/28/2020   Procedure: CORONARY STENT INTERVENTION;  Surgeon: Tonny Bollman, MD;  Location: [facility] INVASIVE CV LAB;  Service: Cardiovascular;  Laterality: N/A;   ESOPHAGOGASTRODUODENOSCOPY (EGD) WITH PROPOFOL N/A 08/09/2017   Procedure: ESOPHAGOGASTRODUODENOSCOPY (EGD) WITH PROPOFOL;  Surgeon: Scot Jun, MD;  Location: [facility];  Service: Endoscopy;  Laterality: N/A;   GREEN LIGHT LASER TURP (TRANSURETHRAL RESECTION OF PROSTATE N/A 12/14/2015   Procedure: GREEN LIGHT LASER TURP (TRANSURETHRAL RESECTION OF PROSTATE) LASER OF BLADDER NECK  CONTRACTURE;  Surgeon: Jethro Bolus, MD;  Location: [facility];  Service: Urology;  Laterality: N/A;   IVC FILTER INSERTION N/A 03/19/2022   Procedure: IVC FILTER INSERTION;  Surgeon: Nada Libman, MD;  Location: [facility] INVASIVE CV LAB;  Service: Cardiovascular;  Laterality: N/A;   IVC FILTER REMOVAL N/A 11/01/2022   Procedure: IVC FILTER REMOVAL;  Surgeon: Renford Dills, MD;  Location: [facility] INV[facility] CV LAB;  Service: Cardiovascular;  Laterality: N/A;   LEFT HEART CATH AND CORONARY ANGIOGRAPHY N/A 12/24/2020   Procedure: LEFT HEART CATH AND CORONARY  ANGIOGRAPHY and possible PCI and stent;  Surgeon: Alwyn Pea, MD;  Location: [facility] INV[facility] CV LAB;  Service: Cardiovascular;  Laterality: N/A;   LEFT HEART CATH AND CORONARY ANGIOGRAPHY N/A 01/06/2021   Procedure: LEFT HEART CATH AND CORONARY ANGIOGRAPHY;  Surgeon: Lennette Bihari, MD;  Location: [facility] INVASIVE CV LAB;  Service: Cardiovascular;  Laterality: N/A;   LOWER EXTREMITY ANGIOGRAPHY Left 03/11/2022   Procedure: Lower Extremity Angiography;  Surgeon: Renford Dills, MD;  Location: [facility] INV[facility] CV LAB;  Service: Cardiovascular;  Laterality: Left;   PENILE PROSTHESIS IMPLANT N/A 12/06/2013   Procedure: PENILE PROTHESIS INFLATABLE;  Surgeon: Kathi Ludwig, MD;  Location: [facility];  Service: Urology;  Laterality: N/A;   PROSTATE CRYOABLATION  06-01-2012  [facility]   PULMONARY THROMBECTOMY N/A 03/18/2022   Procedure: PULMONARY THROMBECTOMY;  Surgeon: Annice Needy, MD;  Location: [facility] INV[facility] CV LAB;  Service: Cardiovascular;  Laterality: N/A;   PULMONARY THROMBECTOMY Bilateral 03/23/2022   Procedure: PULMONARY THROMBECTOMY;  Surgeon: Renford Dills, MD;  Location: [facility] INVASIVE CV LAB;  Service: Cardiovascular;  Laterality: Bilateral;   THROMBECTOMY ILIAC ARTERY Left 03/12/2022   Procedure: THROMBECTOMY LEG;  Surgeon: Fransisco Hertz, MD;  Location: [facility];  Service: Vascular;  Laterality: Left;    TRANSURETHRAL RESECTION OF BLADDER NECK N/A 03/20/2015   Procedure: RELEASE BLADDER NECK CONTRACTURE  WITH WOLF BUTTON ELECTRODE ;  Surgeon: Jethro Bolus, MD;  Location:  [facility];  Service: Urology;  Laterality: N/A;   TRANSURETHRAL RESECTION OF BLADDER TUMOR N/A 12/05/2018   Procedure: TRANSURETHRAL RESECTION OF BLADDER TUMOR (TURBT)/CYSTOSCOPY/  INSTILLATION OF Michelle Piper;  Surgeon: Rene Paci, MD;  Location: [facility];  Service: Urology;  Laterality: N/A;   TRANSURETHRAL RESECTION OF BLADDER TUMOR N/A 01/15/2020   Procedure: TRANSURETHRAL RESECTION OF BLADDER TUMOR (TURBT)/ CYSTOSCOPY;  Surgeon: Rene Paci, MD;  Location: [facility];  Service: Urology;  Laterality: N/A;   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      Family History  Problem Relation Age of Onset   CAD Father 19    Allergies  Allergen Reactions   Doxazosin Other (See Comments)    Other reaction(s): Unknown       Latest Ref Rng & Units 01/18/2023   10:17 AM 10/20/2022   10:13 AM 08/31/2022   10:49 AM  CBC  WBC 4.0 - 10.5 K/uL 7.4  12.8  6.6   Hemoglobin 13.0 - 17.0 g/dL 78.2  95.6  21.3   Hematocrit 39.0 - 52.0 % 46.1  48.8  46.5   Platelets 150 - 400 K/uL 259  351  315       CMP     Component Value Date/Time   NA 137 01/18/2023 1017   NA 141 10/25/2014 2206   K 3.6 01/18/2023 1017   K 4.0 10/25/2014 2206   CL 106 01/18/2023 1017   CL 109 (H) 10/25/2014 2206   CO2 25 01/18/2023 1017   CO2 26 10/25/2014 2206   GLUCOSE 114 (H) 01/18/2023 1017   GLUCOSE 210 (H) 10/25/2014 2206   BUN 19 01/18/2023 1017   BUN 21 08/15/2016 1618   BUN 15 10/25/2014 2206   CREATININE 1.40 (H) 01/18/2023 1017   CREATININE 1.32 (H) 10/25/2014 2206   CALCIUM 8.8 (L) 01/18/2023 1017   CALCIUM 8.2 (L) 10/25/2014 2206   PROT 6.8 01/18/2023 1017   PROT 7.0 10/25/2014 2206   ALBUMIN 3.8 01/18/2023 1017  ALBUMIN 3.2 (L)  10/25/2014 2206   AST 18 01/18/2023 1017   AST 20 10/25/2014 2206   ALT 15 01/18/2023 1017   ALT 22 10/25/2014 2206   ALKPHOS 53 01/18/2023 1017   ALKPHOS 58 10/25/2014 2206   BILITOT 0.9 01/18/2023 1017   BILITOT 0.2 10/25/2014 2206   GFRNONAA 52 (L) 01/18/2023 1017   GFRNONAA 58 (L) 10/25/2014 2206     No results found.     Assessment & Plan:   1. Hx of BKA, left (HCC) Today the patient is walking adequately with his prosthetic however the patient is very active and maneuvers over a multitude of different trains including [place] and rocking areas.  Based on this it would be better for the patient if he had an athletic ankle to allow for better mobility and quality of life.  He is very eager for this adjustment.  2. Primary hypertension Continue antihypertensive medications as already ordered, these medications have been reviewed and there are no changes at this time.  3. Arterial occlusion The patient has had ABIs in the past which have been not significant for any worsening symptoms however given his history of amputation, we will continue to maintain evaluation of his lower extremity perfusion in order to evaluate before issues begin.  He will return in 6 months with noninvasive studies.  4. Neuropathy The patient does have neuropathy.  He notes that whereas previously it was extremely painful for him this is subsided due to some outside treatment.  However he recently has concerns that it may possibly worsen in the future.  We will send the patient to neurology for further evaluation as well as recommendations regarding the possibility of delaying progression if possible. - Ambulatory referral to Neurology   Current Outpatient Medications on File Prior to Visit  Medication Sig Dispense Refill   acetaminophen (TYLENOL) 500 MG tablet Take 500 mg by mouth every 6 (six) hours as needed.     ascorbic acid (VITAMIN C) 500 MG tablet Take 500 mg by mouth daily.     cetirizine  (ZYRTEC) 10 MG tablet Take 10 mg by mouth daily.     clopidogrel (PLAVIX) 75 MG tablet Take 1 tablet (75 mg total) by mouth daily with breakfast. 90 tablet 3   co-enzyme Q-10 30 MG capsule Take 30 mg by mouth daily.     hydroxyurea (HYDREA) 500 MG capsule TAKE 1 CAPSULE BY MOUTH EVERY DAY. MAY TAKE WITH FOOD TO MINIMIZE GI SIDE EFFECTS. 90 capsule 3   Iron-Vitamin C (VITRON-C) 65-125 MG TABS Take 1 tablet by mouth daily. 90 tablet 1   metoprolol tartrate (LOPRESSOR) 25 MG tablet Take 0.5 tablets (12.5 mg total) by mouth 2 (two) times daily. 60 tablet 0   Multiple Vitamin (MULTIVITAMIN) tablet Take 1 tablet by mouth daily.     nitroGLYCERIN (NITROSTAT) 0.4 MG SL tablet Place 1 tablet (0.4 mg total) under the tongue every 5 (five) minutes x 3 doses as needed for chest pain. 25 tablet 3   pantoprazole (PROTONIX) 40 MG tablet Take 1 tablet (40 mg total) by mouth daily. 90 tablet 3   pravastatin (PRAVACHOL) 40 MG tablet Take 40 mg by mouth daily.     testosterone cypionate (DEPOTESTOSTERONE CYPIONATE) 200 MG/ML injection Inject into the muscle.     TRUE METRIX BLOOD GLUCOSE TEST test strip SMARTSIG:Via Meter     TRUEplus Lancets 33G MISC      Turmeric (QC TUMERIC COMPLEX PO) Take by mouth.  levothyroxine (SYNTHROID) 50 MCG tablet Take 50 mcg by mouth.     No current facility-administered medications on file prior to visit.    There are no Patient Instructions on file for this visit. No follow-ups on file.   Georgiana Spinner, NP

## 2023-04-21 ENCOUNTER — Other Ambulatory Visit: Payer: HMO

## 2023-04-26 ENCOUNTER — Ambulatory Visit: Payer: HMO | Admitting: Oncology

## 2023-05-05 ENCOUNTER — Other Ambulatory Visit: Payer: Self-pay

## 2023-05-05 DIAGNOSIS — D471 Chronic myeloproliferative disease: Secondary | ICD-10-CM

## 2023-05-08 ENCOUNTER — Encounter: Payer: Self-pay | Admitting: Oncology

## 2023-05-08 ENCOUNTER — Inpatient Hospital Stay (HOSPITAL_BASED_OUTPATIENT_CLINIC_OR_DEPARTMENT_OTHER): Payer: HMO | Admitting: Oncology

## 2023-05-08 ENCOUNTER — Inpatient Hospital Stay: Payer: HMO | Attending: Oncology

## 2023-05-08 VITALS — BP 126/81 | HR 70 | Temp 96.0°F | Resp 18 | Wt 166.9 lb

## 2023-05-08 DIAGNOSIS — D508 Other iron deficiency anemias: Secondary | ICD-10-CM

## 2023-05-08 DIAGNOSIS — D509 Iron deficiency anemia, unspecified: Secondary | ICD-10-CM | POA: Diagnosis not present

## 2023-05-08 DIAGNOSIS — C674 Malignant neoplasm of posterior wall of bladder: Secondary | ICD-10-CM

## 2023-05-08 DIAGNOSIS — I82409 Acute embolism and thrombosis of unspecified deep veins of unspecified lower extremity: Secondary | ICD-10-CM

## 2023-05-08 DIAGNOSIS — Z86718 Personal history of other venous thrombosis and embolism: Secondary | ICD-10-CM | POA: Insufficient documentation

## 2023-05-08 DIAGNOSIS — Z86711 Personal history of pulmonary embolism: Secondary | ICD-10-CM | POA: Diagnosis not present

## 2023-05-08 DIAGNOSIS — Z8546 Personal history of malignant neoplasm of prostate: Secondary | ICD-10-CM | POA: Diagnosis not present

## 2023-05-08 DIAGNOSIS — Z8551 Personal history of malignant neoplasm of bladder: Secondary | ICD-10-CM | POA: Diagnosis not present

## 2023-05-08 DIAGNOSIS — Z7901 Long term (current) use of anticoagulants: Secondary | ICD-10-CM | POA: Diagnosis not present

## 2023-05-08 DIAGNOSIS — D471 Chronic myeloproliferative disease: Secondary | ICD-10-CM | POA: Diagnosis present

## 2023-05-08 LAB — IRON AND TIBC
Iron: 100 ug/dL (ref 45–182)
Saturation Ratios: 36 % (ref 17.9–39.5)
TIBC: 281 ug/dL (ref 250–450)
UIBC: 181 ug/dL

## 2023-05-08 LAB — CMP (CANCER CENTER ONLY)
ALT: 32 U/L (ref 0–44)
AST: 22 U/L (ref 15–41)
Albumin: 3.5 g/dL (ref 3.5–5.0)
Alkaline Phosphatase: 42 U/L (ref 38–126)
Anion gap: 5 (ref 5–15)
BUN: 21 mg/dL (ref 8–23)
CO2: 25 mmol/L (ref 22–32)
Calcium: 8.4 mg/dL — ABNORMAL LOW (ref 8.9–10.3)
Chloride: 107 mmol/L (ref 98–111)
Creatinine: 1.26 mg/dL — ABNORMAL HIGH (ref 0.61–1.24)
GFR, Estimated: 59 mL/min — ABNORMAL LOW (ref >60)
Glucose, Bld: 163 mg/dL — ABNORMAL HIGH (ref 70–99)
Potassium: 3.9 mmol/L (ref 3.5–5.1)
Sodium: 137 mmol/L (ref 135–145)
Total Bilirubin: 0.7 mg/dL (ref 0.3–1.2)
Total Protein: 6 g/dL — ABNORMAL LOW (ref 6.5–8.1)

## 2023-05-08 LAB — CBC WITH DIFFERENTIAL (CANCER CENTER ONLY)
Abs Immature Granulocytes: 0.05 10*3/uL (ref 0.00–0.07)
Basophils Absolute: 0.1 10*3/uL (ref 0.0–0.1)
Basophils Relative: 1 %
Eosinophils Absolute: 0.2 10*3/uL (ref 0.0–0.5)
Eosinophils Relative: 2 %
HCT: 45.2 % (ref 39.0–52.0)
Hemoglobin: 14.9 g/dL (ref 13.0–17.0)
Immature Granulocytes: 1 %
Lymphocytes Relative: 16 %
Lymphs Abs: 1.4 10*3/uL (ref 0.7–4.0)
MCH: 36 pg — ABNORMAL HIGH (ref 26.0–34.0)
MCHC: 33 g/dL (ref 30.0–36.0)
MCV: 109.2 fL — ABNORMAL HIGH (ref 80.0–100.0)
Monocytes Absolute: 0.7 10*3/uL (ref 0.1–1.0)
Monocytes Relative: 8 %
Neutro Abs: 6.3 10*3/uL (ref 1.7–7.7)
Neutrophils Relative %: 72 %
Platelet Count: 313 10*3/uL (ref 150–400)
RBC: 4.14 MIL/uL — ABNORMAL LOW (ref 4.22–5.81)
RDW: 14.1 % (ref 11.5–15.5)
WBC Count: 8.6 10*3/uL (ref 4.0–10.5)
nRBC: 0 % (ref 0.0–0.2)

## 2023-05-08 LAB — FERRITIN: Ferritin: 44 ng/mL (ref 24–336)

## 2023-05-08 MED ORDER — HYDROXYUREA 500 MG PO CAPS: 500.0000 mg | ORAL_CAPSULE | Freq: Every day | ORAL | 3 refills | Status: DC

## 2023-05-08 NOTE — Assessment & Plan Note (Signed)
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 He tolerates hydroxyurea well. Platelet is wnl Continue Hydroxyurea 500mg  daily.  Refill sent to pharmacy.

## 2023-05-08 NOTE — Assessment & Plan Note (Signed)
Iron panel has improved, still low.  Lab Results  Component Value Date   IRON 66 01/18/2023   TIBC 265 01/18/2023   IRONPCTSAT 25 01/18/2023   FERRITIN 21 (L) 01/18/2023     Recommend patient to continue Vitron C 2-3 times per week.

## 2023-05-09 NOTE — Assessment & Plan Note (Signed)
Recurrent DVT and embolism, s/p IVC filter, s/p IVC filter removal  Continue long term anticoagulation with Eliquis 5mg  BID [ patient gets refills through PCP's office]. Also on Plavix.

## 2023-05-09 NOTE — Progress Notes (Signed)
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 He tolerates hydroxyurea well. Platelet is wnl Continue Hydroxyurea 500mg  daily.  Refill sent to pharmacy.   Iron deficiency anemia Iron panel has improved, still low.  Lab Results  Component Value Date   IRON 100 05/08/2023   TIBC 281 05/08/2023   IRONPCTSAT 36 05/08/2023   FERRITIN 44 05/08/2023    Improved iron store. In the context of his CKD, I recommend patient to continue Vitron C 2-3 times per week.  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 Also on Plavix.     Orders Placed This Encounter  Procedures   CBC with Differential (Cancer Center Only)    Standing Status:   Future    Standing Expiration Date:   05/07/2024   CMP (Cancer Center only)    Standing Status:   Future    Standing Expiration Date:   05/07/2024   Iron and TIBC    Standing Status:   Future    Standing Expiration Date:   05/07/2024   Ferritin    Standing Status:   Future    Standing Expiration Date:   05/07/2024   Follow up  3 months lab MD  All questions were answered. The patient knows to call the clinic with any problems, questions or concerns.  Phillip Patience, MD, PhD [facility] Hematology Oncology 05/08/2023      Pertinent hematology oncology history   Patient has significant history of previous DVT and PE.   Patient was seen by me in March 2020 and declined further  follow-up. 2013 history of prostate cancer treated with cryo therapy  01/03/2016 history of superficial venous thrombosis demonstrated in the greater saphenous vein-.  Also history of superficial thrombophlebitis of segment  of left the basal leg vein in the upper arm-08/24/2017.  No anticoagulation was recommended at that time due to the SVT features. 11/25/2018, right lower extremity acute DVT, started on Lovenox for about a week, 12/05/2018, patient underwent TURBT and intravesical instillation of gemcitabine for noninvasive low-grade papillary urothelial carcinoma.  Lovenox was held for 2 days prior and for 24 hours after procedure, and also started on Eliquis 12/15/2018, developed acute nonocclusive segmental and subsegmental pulmonary emboli within the right upper lobe, right middle lobe, right lower lobe pulmonary branches.  Patient was admitted to the hospital and started on heparin drip.  Patient was seen by me during his admission at that time.  Lovenox 1 mg/kg twice daily was recommended at discharge.  He declined further follow-up with me in the clinic.   His anticoagulation was managed by primary care provider Dr. Hyacinth [name].  Patient was on Coumadin for anticoagulation until Feb 2023.  Patient did not check INR since September 2022.  Patient also developed supratherapeutic INR due to interaction between Coumadin and Diflucan for treatment of esophageal candidiasis.  Patient was off Coumadin on 11/17/2021.  11/21/2021 developed acute nonocclusive DVT, started on Lovenox decision was made to switch to Eliquis.  03/09/2022, artificial urinary sphincter placement.  His Eliquis was held for 1 week prior to the procedure.  He has noticed left lower extremity pain 1 to 2 days after holding Eliquis. 03/11/2022, ER visit due to progressively worsening left lower extremity pain. CT angio aortobifemoral with and without contrast showed eccentric thrombus in the left outflow vessels with distal occlusion of left  renal vessels.  Delayed filling of the right popliteal artery is concerning for underlying occlusions and thromboembolic disease in the right runoff vessels Started on heparin drip in ED. 03/11/2022, vascular surgeon lower extremity bypass surgery 03/12/2022, severe left lower extremity pain, return to the OR for left lower extremity thrombectomy.  Continued on heparin drip. 03/16/2022, left lower extremity extremity nonsalvageable.  Status post BKA.  Per my discussion with pharmacy, heparin was held around 9 AM on 03/16/2022 and resumed on 03/17/2022 9:30 PM. . 03/17/2022, acute respiratory failure, CT chest angiogram showed bilateral central pulm emboli involving the main pulmonary arteries and extending into all lobar branches with nearly occlusive clot to the right lower lobe, left upper lobe, several segmental branches.  CT evidence of right heart strain.Mildly increased sclerotic focus    03/18/2022, thrombolysis, mechanical thrombectomy.  Heparin gtt restarted after procedure. 03/19/2022, heparin gtt was continued. 03/19/2022, bilateral lower extremity ultrasound showed thrombosis of right femoral popliteal and calf veins.  Partially thrombosed left profunda femoris vein. 03/19/2022 IVC filter placement. 03/20/2022, still have significant hypoxia.  On heparin gtt. 03/21/2022 -03/22/2022, on  heparin gtt. 03/23/2022, CT angio chest.showed increased clot burden within right pulmonary artery and the right lower lobe pulmonary emboli with similar clot burden in the right middle lobe, right upper lobe, left lower lobe.  Single residual fibrin strand/clot at the bifurcation of the pulmonary artery with decreased clot burden in the left upper lobe artery branches.  Persistent right heart strain.  Findings suspicious for developing pulmonary infarcts. 03/23/2022, mechanical embolectomy of pulmonary arteries as well as right external iliac vein, common iliac vein and inferior vena cava.   Given that patient developed  recurrent PE/DVT despite being therapeutic on heparin GTT, post thrombectomy, patient was switched to Angiomax and later started on Argatroban drip  Patient had  hypercoagulable work-up done during the admission. Work-up showed negative factor V Leiden mutation, prothrombin gene imaging.  Negative anticardiolipin antibodies, lupus anticoagulants.  Negative beta-2 glycoprotein antibodies Positive JAK2 V6 83F mutation Patient was discharged on Eliquis.     06/01/22 Bone marrow biopsy showed  BONE MARROW, ASPIRATE, CLOT, CORE:  -Hypercellular marrow (60%) involved by a JAK2 mutated myeloproliferative neoplasm (see comment) Comment: The overall findings are consistent with a myeloproliferative neoplasm. Diagnostic considerations include essential thrombocythemia, polycythemia vera and pre- fibrotic primary myelofibrosis. Importantly, there is no overt increase in blasts or fibrosis.  However, correlation with erythropoietin levels, iron studies, presence/absence of splenomegaly and serum LDH along with cytogenetic studies would be  helpful for a more definitive assessment.   PERIPHERAL BLOOD:   -Thrombocytosis  - Mild leukocytosis  - Normocytic normochromic anemia   Cytogenetics normal   INTERVAL HISTORY Phillip [name] is a 77 y.o. male who has above history reviewed by me today presents for follow up visit for  recurrent thrombosis, Jak2 V683F mutation myeloproliferative disease  Patient tolerates Hydroxyurea 500 mg daily. He has left lower extremity prosthetics.  Status post IVC filter removal.  Patient is on anticoagulation with Eliquis and Plavix.  Tolerates well.  No bleeding events.  MEDICAL HISTORY:  Past Medical History:  Diagnosis Date  Arthritis    Basal cell carcinoma    L clavicle, L prox forearm- removed years ago    Bladder cancer [facility])    Bladder neck contracture    CAD (coronary artery disease)    a. 12/2020 NSTEMI/Cath: LM nl, LAD 95ost/p, LCX 50p, RCA nl. EF  45-50%.   Cataract    CKD (chronic kidney disease), stage III [facility])    ED (erectile dysfunction)    Frequency    GERD (gastroesophageal reflux disease)    History of pulmonary embolus (PE) 11/2018   a. Following LE DVT-->Chronic warfarin.   Hyperlipidemia LDL goal <70    Hypertension    Hypothyroidism    Incontinence of urine    sui, s/p cryoablation   Ischemic cardiomyopathy    a. 11/2018 Echo: EF 60-65%; b. 12/2020 LV Gram: EF 45-50% in setting of NSTEMI.   Kidney stones    Neuropathy    feet   Nocturia    Prostate cancer (HCC)    S/P   CRYOABLATION   Right elbow tendinitis    Right Lower Extremity DVT (deep venous thrombosis) (HCC) 11/25/2018   Vertigo    1-2x/yr   Wears glasses     SURGICAL HISTORY: Past Surgical History:  Procedure Laterality Date   AMPUTATION Left 03/16/2022   Procedure: AMPUTATION BELOW KNEE;  Surgeon: Renford Dills, MD;  Location: [facility] ORS;  Service: Vascular;  Laterality: Left;   ARTHROPLASTY AND TENDON REPAIR LEFT THUMB  JAN 2014   CATARACT EXTRACTION W/PHACO Left 11/16/2020   Procedure: CATARACT EXTRACTION PHACO AND INTRAOCULAR LENS PLACEMENT (IOC) LEFT  7.09 00:56.4 12.6%;  Surgeon: Lockie Mola, MD;  Location: [facility];  Service: Ophthalmology;  Laterality: Left;   CATARACT EXTRACTION W/PHACO Right 12/02/2020   Procedure: CATARACT EXTRACTION PHACO AND INTRAOCULAR LENS PLACEMENT (IOC) RIGHT MALYUGIN 5.52 00:49.7 11.1%;  Surgeon: Lockie Mola, MD;  Location: [facility]ERY [facility];  Service: Ophthalmology;  Laterality: Right;   COLONOSCOPY     COLONOSCOPY WITH PROPOFOL N/A 08/09/2017   Procedure: COLONOSCOPY WITH PROPOFOL;  Surgeon: Scot Jun, MD;  Location: [facility] ENDOSCOPY;  Service: Endoscopy;  Laterality: N/A;   CORONARY ANGIOGRAPHY N/A 12/28/2020   Procedure: CORONARY ANGIOGRAPHY;  Surgeon: Tonny Bollman, MD;  Location: [facility] INVASIVE CV LAB;  Service: Cardiovascular;  Laterality: N/A;   CORONARY STENT  INTERVENTION N/A 12/28/2020   Procedure: CORONARY STENT INTERVENTION;  Surgeon: Tonny Bollman, MD;  Location: [facility] INVASIVE CV LAB;  Service: Cardiovascular;  Laterality: N/A;   ESOPHAGOGASTRODUODENOSCOPY (EGD) WITH PROPOFOL N/A 08/09/2017   Procedure: ESOPHAGOGASTRODUODENOSCOPY (EGD) WITH PROPOFOL;  Surgeon: Scot Jun, MD;  Location: [facility] ENDOSCOPY;  Service: Endoscopy;  Laterality: N/A;   GREEN LIGHT LASER TURP (TRANSURETHRAL RESECTION OF PROSTATE N/A 12/14/2015   Procedure: GREEN LIGHT LASER TURP (TRANSURETHRAL RESECTION OF PROSTATE) LASER OF BLADDER NECK CONTRACTURE;  Surgeon: Jethro Bolus, MD;  Location: [facility];  Service: Urology;  Laterality: N/A;   IVC FILTER INSERTION N/A 03/19/2022   Procedure: IVC FILTER INSERTION;  Surgeon: Nada Libman, MD;  Location: [facility] INVASIVE CV LAB;  Service: Cardiovascular;  Laterality: N/A;   IVC FILTER REMOVAL N/A 11/01/2022   Procedure: IVC FILTER REMOVAL;  Surgeon: Renford Dills, MD;  Location: [facility] INVASIVE CV LAB;  Service: Cardiovascular;  Laterality: N/A;   LEFT HEART CATH AND CORONARY ANGIOGRAPHY N/A 12/24/2020   Procedure: LEFT HEART CATH AND CORONARY ANGIOGRAPHY and possible PCI and stent;  Surgeon: Alwyn Pea, MD;  Location: [facility] INVASIVE CV LAB;  Service: Cardiovascular;  Laterality: N/A;   LEFT HEART CATH AND CORONARY ANGIOGRAPHY N/A 01/06/2021   Procedure: LEFT HEART CATH AND CORONARY ANGIOGRAPHY;  Surgeon: Lennette Bihari, MD;  Location: [facility] INVASIVE CV LAB;  Service: Cardiovascular;  Laterality: N/A;   LOWER EXTREMITY ANGIOGRAPHY Left 03/11/2022   Procedure: Lower Extremity Angiography;  Surgeon: Renford Dills, MD;  Location: [facility] INVASIVE CV LAB;  Service: Cardiovascular;  Laterality: Left;   PENILE PROSTHESIS IMPLANT N/A 12/06/2013   Procedure: PENILE PROTHESIS INFLATABLE;  Surgeon: Kathi Ludwig, MD;  Location: [facility];  Service: Urology;  Laterality: N/A;   PROSTATE  CRYOABLATION  06-01-2012  [place]   PULMONARY THROMBECTOMY N/A 03/18/2022   Procedure: PULMONARY THROMBECTOMY;  Surgeon: Annice Needy, MD;  Location: [facility] INVASIVE CV LAB;  Service: Cardiovascular;  Laterality: N/A;   PULMONARY THROMBECTOMY Bilateral 03/23/2022   Procedure: PULMONARY THROMBECTOMY;  Surgeon: Renford Dills, MD;  Location: [facility] INVASIVE CV LAB;  Service: Cardiovascular;  Laterality: Bilateral;   THROMBECTOMY ILIAC ARTERY Left 03/12/2022   Procedure: THROMBECTOMY LEG;  Surgeon: Fransisco Hertz, MD;  Location: [facility] OR[facility];  Service: Vascular;  Laterality: Left;   TRANSURETHRAL RESECTION OF BLADDER NECK N/A 03/20/2015   Procedure: RELEASE BLADDER NECK CONTRACTURE  WITH WOLF BUTTON ELECTRODE ;  Surgeon: Jethro Bolus, MD;  Location: [facility];  Service: Urology;  Laterality: N/A;   TRANSURETHRAL RESECTION OF BLADDER TUMOR N/A 12/05/2018   Procedure: TRANSURETHRAL RESECTION OF BLADDER TUMOR (TURBT)/CYSTOSCOPY/  INSTILLATION OF Michelle Piper;  Surgeon: Rene Paci, MD;  Location: [facility];  Service: Urology;  Laterality: N/A;   TRANSURETHRAL RESECTION OF BLADDER TUMOR N/A 01/15/2020   Procedure: TRANSURETHRAL RESECTION OF BLADDER TUMOR (TURBT)/ CYSTOSCOPY;  Surgeon: Rene Paci, MD;  Location: [facility];  Service: Urology;  Laterality: N/A;   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      SOCIAL HISTORY: Social History   Socioeconomic History   Marital status: Married    Spouse name: Aaqil Massucci   Number of children: 3   Years of education: Not on file   Highest education level: Not on file  Occupational History   Not on file  Tobacco Use   Smoking status: Never    Passive exposure: Never   Smokeless tobacco: Never  Vaping Use   Vaping status: Never Used  Substance and Sexual Activity   Alcohol use: No   Drug use: No   Sexual activity: Not Currently    Birth control/protection:  None  Other Topics Concern   Not on file  Social History Narrative   Lives locally with wife.  Exercises regularly - gym/golf.  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   3 sons: 1 is a Engineer, civil (consulting).    Social Determinants of Health   Financial Resource Strain: Not on file  Food Insecurity: No Food Insecurity (09/20/2022)   Hunger Vital Sign    Worried About Running Out of Food in the Last Year: Never true    Ran Out of Food in the Last Year: Never true  Transportation Needs: No Transportation Needs (09/20/2022)   PRAPARE - Administrator, Civil Service (Medical): No    Lack of Transportation (Non-Medical): No  Physical Activity: Not on file  Stress: Not on file  Social Connections: Not on file  Intimate Partner Violence: Not on file    FAMILY HISTORY: Family History  Problem Relation  Age of Onset   CAD Father 104    ALLERGIES:  is allergic to doxazosin.  MEDICATIONS:  Current Outpatient Medications  Medication Sig Dispense Refill   acetaminophen (TYLENOL) 500 MG tablet Take 500 mg by mouth every 6 (six) hours as needed.     apixaban (ELIQUIS) 5 MG TABS tablet Take 1 tablet (5 mg total) by mouth 2 (two) times daily. 60 tablet 0   cetirizine (ZYRTEC) 10 MG tablet Take 10 mg by mouth daily.     clopidogrel (PLAVIX) 75 MG tablet Take 1 tablet (75 mg total) by mouth daily with breakfast. 90 tablet 3   co-enzyme Q-10 30 MG capsule Take 30 mg by mouth daily.     Iron-Vitamin C (VITRON-C) 65-125 MG TABS Take 1 tablet by mouth daily. 90 tablet 1   levothyroxine (SYNTHROID) 50 MCG tablet Take 50 mcg by mouth.     metoprolol tartrate (LOPRESSOR) 25 MG tablet Take 0.5 tablets (12.5 mg total) by mouth 2 (two) times daily. 60 tablet 0   Multiple Vitamin (MULTIVITAMIN) tablet Take 1 tablet by mouth daily.     nitroGLYCERIN (NITROSTAT) 0.4 MG SL tablet Place 1 tablet (0.4 mg total) under the tongue every 5 (five) minutes x 3 doses as needed for chest pain.  25 tablet 3   pantoprazole (PROTONIX) 40 MG tablet Take 1 tablet (40 mg total) by mouth daily. 90 tablet 3   pravastatin (PRAVACHOL) 40 MG tablet Take 40 mg by mouth daily.     TRUE METRIX BLOOD GLUCOSE TEST test strip SMARTSIG:Via Meter     TRUEplus Lancets 33G MISC      Turmeric (QC TUMERIC COMPLEX PO) Take by mouth.     ascorbic acid (VITAMIN C) 500 MG tablet Take 500 mg by mouth daily. (Patient not taking: Reported on 05/08/2023)     hydroxyurea (HYDREA) 500 MG capsule Take 1 capsule (500 mg total) by mouth daily. May take with food to minimize GI side effects. 90 capsule 3   testosterone cypionate (DEPOTESTOSTERONE CYPIONATE) 200 MG/ML injection Inject into the muscle. (Patient not taking: Reported on 05/08/2023)     No current facility-administered medications for this visit.    Review of Systems  Constitutional:  Negative for appetite change, chills, fatigue, fever and unexpected weight change.  HENT:   Negative for hearing loss and voice change.   Eyes:  Negative for eye problems and icterus.  Respiratory:  Negative for chest tightness, cough and shortness of breath.   Cardiovascular:  Negative for chest pain and leg swelling.  Gastrointestinal:  Negative for abdominal distention and abdominal pain.  Endocrine: Negative for hot flashes.  Genitourinary:  Negative for difficulty urinating, dysuria and frequency.   Musculoskeletal:  Negative for arthralgias.       Left BKA  Skin:  Negative for itching and rash.  Neurological:  Negative for light-headedness and numbness.  Hematological:  Negative for adenopathy. Does not bruise/bleed easily.  Psychiatric/Behavioral:  Negative for confusion.    PHYSICAL EXAMINATION:  Vitals:   05/08/23 1502  BP: 126/81  Pulse: 70  Resp: 18  Temp: (!) 96 F (35.6 C)  SpO2: 97%   Filed Weights   05/08/23 1502  Weight: 166 lb 14.4 oz (75.7 kg)    Physical Exam Constitutional:      General: He is not in acute distress. HENT:     Head:  Normocephalic and atraumatic.  Eyes:     General: No scleral icterus. Cardiovascular:     Rate and  Rhythm: Normal rate.  Pulmonary:     Effort: Pulmonary effort is normal. No respiratory distress.     Breath sounds: No wheezing.  Abdominal:     General: Bowel sounds are normal. There is no distension.     Palpations: Abdomen is soft.  Musculoskeletal:        General: Normal range of motion.     Cervical back: Normal range of motion and neck supple.     Comments: Status post left BKA, + left lower limb prosthetics.   Skin:    General: Skin is warm and dry.     Findings: No erythema or rash.  Neurological:     Mental Status: He is alert and oriented to person, place, and time. Mental status is at baseline.     Cranial Nerves: No cranial nerve deficit.     Coordination: Coordination normal.  Psychiatric:        Mood and Affect: Mood normal.     LABORATORY DATA:  I have reviewed the data as listed    Latest Ref Rng & Units 05/08/2023    2:43 PM 01/18/2023   10:17 AM 10/20/2022   10:13 AM  CBC  WBC 4.0 - 10.5 K/uL 8.6  7.4  12.8   Hemoglobin 13.0 - 17.0 g/dL 56.2  13.0  86.5   Hematocrit 39.0 - 52.0 % 45.2  46.1  48.8   Platelets 150 - 400 K/uL 313  259  351       Latest Ref Rng & Units 05/08/2023    2:43 PM 01/18/2023   10:17 AM 10/20/2022   10:13 AM  CMP  Glucose 70 - 99 mg/dL 784  696  295   BUN 8 - 23 mg/dL 21  19  24    Creatinine 0.61 - 1.24 mg/dL 2.84  1.32  4.40   Sodium 135 - 145 mmol/L 137  137  140   Potassium 3.5 - 5.1 mmol/L 3.9  3.6  4.0   Chloride 98 - 111 mmol/L 107  106  108   CO2 22 - 32 mmol/L 25  25  24    Calcium 8.9 - 10.3 mg/dL 8.4  8.8  8.8   Total Protein 6.5 - 8.1 g/dL 6.0  6.8  7.4   Total Bilirubin 0.3 - 1.2 mg/dL 0.7  0.9  0.6   Alkaline Phos 38 - 126 U/L 42  53  60   AST 15 - 41 U/L 22  18  19    ALT 0 - 44 U/L 32  15  20       RADIOGRAPHIC STUDIES: I have personally reviewed the radiological images as listed and agreed with the findings in  the report. No results found.

## 2023-05-09 NOTE — Assessment & Plan Note (Signed)
Non invasive bladder cancer s/p  tx'd with TURBT in 2020 and then BCG, follow up with urology 

## 2023-05-11 ENCOUNTER — Ambulatory Visit (INDEPENDENT_AMBULATORY_CARE_PROVIDER_SITE_OTHER): Payer: HMO | Admitting: Dermatology

## 2023-05-11 ENCOUNTER — Encounter: Payer: Self-pay | Admitting: Dermatology

## 2023-05-11 VITALS — BP 122/72

## 2023-05-11 DIAGNOSIS — L729 Follicular cyst of the skin and subcutaneous tissue, unspecified: Secondary | ICD-10-CM

## 2023-05-11 DIAGNOSIS — W908XXA Exposure to other nonionizing radiation, initial encounter: Secondary | ICD-10-CM | POA: Diagnosis not present

## 2023-05-11 DIAGNOSIS — L570 Actinic keratosis: Secondary | ICD-10-CM

## 2023-05-11 DIAGNOSIS — D692 Other nonthrombocytopenic purpura: Secondary | ICD-10-CM | POA: Diagnosis not present

## 2023-05-11 DIAGNOSIS — L820 Inflamed seborrheic keratosis: Secondary | ICD-10-CM | POA: Diagnosis not present

## 2023-05-11 DIAGNOSIS — L720 Epidermal cyst: Secondary | ICD-10-CM | POA: Diagnosis not present

## 2023-05-11 DIAGNOSIS — L578 Other skin changes due to chronic exposure to nonionizing radiation: Secondary | ICD-10-CM

## 2023-05-11 NOTE — Patient Instructions (Addendum)

## 2023-05-11 NOTE — Progress Notes (Signed)
Follow-Up Visit   Subjective  Phillip [name] is a 77 y.o. male who presents for the following: check spot L arm, was txted with LN2 6wks ago by PCP, check tag R neck 6yrs, irritating, check spot R ear 66yrs, no symptoms, Scaling around ears/neck, Bil temples scaly sensitive areas The patient has spots, moles and lesions to be evaluated, some may be new or changing and the patient may have concern these could be cancer.   The following portions of the chart were reviewed this encounter and updated as appropriate: medications, allergies, medical history  Review of Systems:  No other skin or systemic complaints except as noted in HPI or Assessment and Plan.  Objective  Well appearing patient in no apparent distress; mood and affect are within normal limits.   A focused examination was performed of the following areas: L arm, face, neck, ears  Relevant exam findings are noted in the Assessment and Plan.  L dorsum forearm near the wrist x 1 Stuck on waxy paps with erythema  R temple x 2, L face x 3, R ear x 1, L neck s 1 (7) Pink scaly macules    Assessment & Plan   Purpura - Chronic; persistent and recurrent.  Treatable, but not curable. - Violaceous macules and patches - Benign - Related to trauma, age, sun damage and/or use of blood thinners, chronic use of topical and/or oral steroids - Observe - Can use OTC arnica containing moisturizer such as Dermend Bruise Formula if desired - Call for worsening or other concerns - arms  Milia - tiny firm white papules - type of cyst - benign - sometimes these will clear with nightly OTC adapalene/Differin 0.1% gel or retinol. - may be extracted if symptomatic - observe  - R neck  Inflamed seborrheic keratosis L dorsum forearm near the wrist x 1  Symptomatic, irritating, patient would like treated.  Recheck L dorsum forearm near the wrist  Destruction of lesion - L dorsum forearm near the wrist x 1 Complexity: simple    Destruction method: cryotherapy   Informed consent: discussed and consent obtained   Timeout:  patient name, date of birth, surgical site, and procedure verified Lesion destroyed using liquid nitrogen: Yes   Region frozen until ice ball extended beyond lesion: Yes   Outcome: patient tolerated procedure well with no complications   Post-procedure details: wound care instructions given    AK (actinic keratosis) (7) R temple x 2, L face x 3, R ear x 1, L neck s 1  Actinic keratoses are precancerous spots that appear secondary to cumulative UV radiation exposure/sun exposure over time. They are chronic with expected duration over 1 year. A portion of actinic keratoses will progress to squamous cell carcinoma of the skin. It is not possible to reliably predict which spots will progress to skin cancer and so treatment is recommended to prevent development of skin cancer.  Recommend daily broad spectrum sunscreen SPF 30+ to sun-exposed areas, reapply every 2 hours as needed.  Recommend staying in the shade or wearing long sleeves, sun glasses (UVA+UVB protection) and wide brim hats (4-inch brim around the entire circumference of the hat). Call for new or changing lesions.  Destruction of lesion - R temple x 2, L face x 3, R ear x 1, L neck s 1 (7) Complexity: simple   Destruction method: cryotherapy   Informed consent: discussed and consent obtained   Timeout:  patient name, date of birth, surgical site, and procedure  verified Lesion destroyed using liquid nitrogen: Yes   Region frozen until ice ball extended beyond lesion: Yes   Outcome: patient tolerated procedure well with no complications   Post-procedure details: wound care instructions given    ACTINIC DAMAGE - chronic, secondary to cumulative UV radiation exposure/sun exposure over time - diffuse scaly erythematous macules with underlying dyspigmentation - Recommend daily broad spectrum sunscreen SPF 30+ to sun-exposed areas, reapply  every 2 hours as needed.  - Recommend staying in the shade or wearing long sleeves, sun glasses (UVA+UVB protection) and wide brim hats (4-inch brim around the entire circumference of the hat). - Call for new or changing lesions.    Return for as scheduled for TBSE, Recheck ISK L dorsum forearm near wrist, Hx of BCC, AK f/u.  I, Ardis Rowan, RMA, am acting as scribe for Armida Sans, MD .   Documentation: I have reviewed the above documentation for accuracy and completeness, and I agree with the above.  Armida Sans, MD

## 2023-05-19 ENCOUNTER — Encounter: Payer: Self-pay | Admitting: Dermatology

## 2023-07-19 ENCOUNTER — Ambulatory Visit: Payer: HMO | Admitting: Dermatology

## 2023-07-19 DIAGNOSIS — L570 Actinic keratosis: Secondary | ICD-10-CM

## 2023-07-19 DIAGNOSIS — L309 Dermatitis, unspecified: Secondary | ICD-10-CM

## 2023-07-19 DIAGNOSIS — Z1283 Encounter for screening for malignant neoplasm of skin: Secondary | ICD-10-CM

## 2023-07-19 DIAGNOSIS — L814 Other melanin hyperpigmentation: Secondary | ICD-10-CM

## 2023-07-19 DIAGNOSIS — W908XXA Exposure to other nonionizing radiation, initial encounter: Secondary | ICD-10-CM | POA: Diagnosis not present

## 2023-07-19 DIAGNOSIS — L821 Other seborrheic keratosis: Secondary | ICD-10-CM

## 2023-07-19 DIAGNOSIS — L820 Inflamed seborrheic keratosis: Secondary | ICD-10-CM

## 2023-07-19 DIAGNOSIS — L578 Other skin changes due to chronic exposure to nonionizing radiation: Secondary | ICD-10-CM

## 2023-07-19 DIAGNOSIS — Z85828 Personal history of other malignant neoplasm of skin: Secondary | ICD-10-CM

## 2023-07-19 DIAGNOSIS — L918 Other hypertrophic disorders of the skin: Secondary | ICD-10-CM | POA: Diagnosis not present

## 2023-07-19 DIAGNOSIS — D229 Melanocytic nevi, unspecified: Secondary | ICD-10-CM

## 2023-07-19 DIAGNOSIS — Z7189 Other specified counseling: Secondary | ICD-10-CM

## 2023-07-19 DIAGNOSIS — Z79899 Other long term (current) drug therapy: Secondary | ICD-10-CM

## 2023-07-19 DIAGNOSIS — D1801 Hemangioma of skin and subcutaneous tissue: Secondary | ICD-10-CM

## 2023-07-19 NOTE — Patient Instructions (Addendum)

## 2023-07-19 NOTE — Progress Notes (Signed)
Follow-Up Visit   Subjective  Phillip [name] is a 77 y.o. male who presents for the following: Skin Cancer Screening and Full Body Skin Exam  The patient presents for Total-Body Skin Exam (TBSE) for skin cancer screening and mole check. The patient has spots, moles and lesions to be evaluated, some may be new or changing and the patient may have concern these could be cancer.  The following portions of the chart were reviewed this encounter and updated as appropriate: medications, allergies, medical history  Review of Systems:  No other skin or systemic complaints except as noted in HPI or Assessment and Plan.  Objective  Well appearing patient in no apparent distress; mood and affect are within normal limits.  A full examination was performed including scalp, head, eyes, ears, nose, lips, neck, chest, axillae, abdomen, back, buttocks, bilateral upper extremities, bilateral lower extremities, hands, feet, fingers, toes, fingernails, and toenails. All findings within normal limits unless otherwise noted below.   Relevant physical exam findings are noted in the Assessment and Plan.  R neck x 1 Fleshy, skin-colored pedunculated papules.    Face and ears x 6 (6) Pink scaly macules  L arm x 1, chest x 1 (2) Stuck on waxy paps with erythema    Assessment & Plan   SKIN CANCER SCREENING PERFORMED TODAY.  ACTINIC DAMAGE - Chronic condition, secondary to cumulative UV/sun exposure - diffuse scaly erythematous macules with underlying dyspigmentation - Recommend daily broad spectrum sunscreen SPF 30+ to sun-exposed areas, reapply every 2 hours as needed.  - Staying in the shade or wearing long sleeves, sun glasses (UVA+UVB protection) and wide brim hats (4-inch brim around the entire circumference of the hat) are also recommended for sun protection.  - Call for new or changing lesions.  LENTIGINES, SEBORRHEIC KERATOSES, HEMANGIOMAS - Benign normal skin lesions - Benign-appearing -  Call for any changes  MELANOCYTIC NEVI - Tan-brown and/or pink-flesh-colored symmetric macules and papules - Benign appearing on exam today - Observation - Call clinic for new or changing moles - Recommend daily use of broad spectrum spf 30+ sunscreen to sun-exposed areas.   HISTORY OF BASAL CELL CARCINOMA OF THE SKIN - No evidence of recurrence today - Recommend regular full body skin exams - Recommend daily broad spectrum sunscreen SPF 30+ to sun-exposed areas, reapply every 2 hours as needed.  - Call if any new or changing lesions are noted between office visits  Dermatitis, possible contact dermatitis to unknown cause  Exam: Neck clear today.  Treatment Plan: Recommend OTC HC cream to aa's QD-BID PRN flares.  Skin tag R neck x 1  Symptomatic, irritating, patient would like treated.   Destruction of lesion - R neck x 1 Complexity: simple   Destruction method: cryotherapy   Informed consent: discussed and consent obtained   Timeout:  patient name, date of birth, surgical site, and procedure verified Lesion destroyed using liquid nitrogen: Yes   Region frozen until ice ball extended beyond lesion: Yes   Outcome: patient tolerated procedure well with no complications   Post-procedure details: wound care instructions given    AK (actinic keratosis) (6) Face and ears x 6  Actinic keratoses are precancerous spots that appear secondary to cumulative UV radiation exposure/sun exposure over time. They are chronic with expected duration over 1 year. A portion of actinic keratoses will progress to squamous cell carcinoma of the skin. It is not possible to reliably predict which spots will progress to skin cancer and so treatment  is recommended to prevent development of skin cancer.  Recommend daily broad spectrum sunscreen SPF 30+ to sun-exposed areas, reapply every 2 hours as needed.  Recommend staying in the shade or wearing long sleeves, sun glasses (UVA+UVB protection) and  wide brim hats (4-inch brim around the entire circumference of the hat). Call for new or changing lesions.  Destruction of lesion - Face and ears x 6 (6) Complexity: simple   Destruction method: cryotherapy   Informed consent: discussed and consent obtained   Timeout:  patient name, date of birth, surgical site, and procedure verified Lesion destroyed using liquid nitrogen: Yes   Region frozen until ice ball extended beyond lesion: Yes   Outcome: patient tolerated procedure well with no complications   Post-procedure details: wound care instructions given    Inflamed seborrheic keratosis (2) L arm x 1, chest x 1  Symptomatic, irritating, patient would like treated.  Destruction of lesion - L arm x 1, chest x 1 (2) Complexity: simple   Destruction method: cryotherapy   Informed consent: discussed and consent obtained   Timeout:  patient name, date of birth, surgical site, and procedure verified Lesion destroyed using liquid nitrogen: Yes   Region frozen until ice ball extended beyond lesion: Yes   Outcome: patient tolerated procedure well with no complications   Post-procedure details: wound care instructions given     Return in about 1 year (around 07/18/2024) for TBSE.  Maylene Roes, CMA, am acting as scribe for Armida Sans, MD .   Documentation: I have reviewed the above documentation for accuracy and completeness, and I agree with the above.  Armida Sans, MD

## 2023-07-29 ENCOUNTER — Encounter: Payer: Self-pay | Admitting: Dermatology

## 2023-08-07 ENCOUNTER — Encounter: Payer: Self-pay | Admitting: Oncology

## 2023-08-07 ENCOUNTER — Inpatient Hospital Stay (HOSPITAL_BASED_OUTPATIENT_CLINIC_OR_DEPARTMENT_OTHER): Payer: HMO | Admitting: Oncology

## 2023-08-07 ENCOUNTER — Inpatient Hospital Stay: Payer: HMO | Attending: Oncology

## 2023-08-07 VITALS — BP 167/88 | HR 60 | Temp 95.6°F | Resp 18 | Wt 174.5 lb

## 2023-08-07 DIAGNOSIS — D509 Iron deficiency anemia, unspecified: Secondary | ICD-10-CM | POA: Insufficient documentation

## 2023-08-07 DIAGNOSIS — Z8551 Personal history of malignant neoplasm of bladder: Secondary | ICD-10-CM | POA: Insufficient documentation

## 2023-08-07 DIAGNOSIS — I82409 Acute embolism and thrombosis of unspecified deep veins of unspecified lower extremity: Secondary | ICD-10-CM | POA: Diagnosis not present

## 2023-08-07 DIAGNOSIS — D508 Other iron deficiency anemias: Secondary | ICD-10-CM

## 2023-08-07 DIAGNOSIS — D471 Chronic myeloproliferative disease: Secondary | ICD-10-CM

## 2023-08-07 DIAGNOSIS — Z7901 Long term (current) use of anticoagulants: Secondary | ICD-10-CM | POA: Diagnosis not present

## 2023-08-07 DIAGNOSIS — Z86718 Personal history of other venous thrombosis and embolism: Secondary | ICD-10-CM | POA: Insufficient documentation

## 2023-08-07 DIAGNOSIS — C674 Malignant neoplasm of posterior wall of bladder: Secondary | ICD-10-CM | POA: Diagnosis not present

## 2023-08-07 LAB — CBC WITH DIFFERENTIAL (CANCER CENTER ONLY)
Abs Immature Granulocytes: 0.01 10*3/uL (ref 0.00–0.07)
Basophils Absolute: 0.1 10*3/uL (ref 0.0–0.1)
Basophils Relative: 1 %
Eosinophils Absolute: 0.2 10*3/uL (ref 0.0–0.5)
Eosinophils Relative: 2 %
HCT: 44 % (ref 39.0–52.0)
Hemoglobin: 14.4 g/dL (ref 13.0–17.0)
Immature Granulocytes: 0 %
Lymphocytes Relative: 19 %
Lymphs Abs: 1.8 10*3/uL (ref 0.7–4.0)
MCH: 35.1 pg — ABNORMAL HIGH (ref 26.0–34.0)
MCHC: 32.7 g/dL (ref 30.0–36.0)
MCV: 107.3 fL — ABNORMAL HIGH (ref 80.0–100.0)
Monocytes Absolute: 0.6 10*3/uL (ref 0.1–1.0)
Monocytes Relative: 7 %
Neutro Abs: 6.7 10*3/uL (ref 1.7–7.7)
Neutrophils Relative %: 71 %
Platelet Count: 273 10*3/uL (ref 150–400)
RBC: 4.1 MIL/uL — ABNORMAL LOW (ref 4.22–5.81)
RDW: 12.8 % (ref 11.5–15.5)
WBC Count: 9.3 10*3/uL (ref 4.0–10.5)
nRBC: 0 % (ref 0.0–0.2)

## 2023-08-07 LAB — IRON AND TIBC
Iron: 74 ug/dL (ref 45–182)
Saturation Ratios: 29 % (ref 17.9–39.5)
TIBC: 253 ug/dL (ref 250–450)
UIBC: 179 ug/dL

## 2023-08-07 LAB — CMP (CANCER CENTER ONLY)
ALT: 23 U/L (ref 0–44)
AST: 22 U/L (ref 15–41)
Albumin: 3.5 g/dL (ref 3.5–5.0)
Alkaline Phosphatase: 48 U/L (ref 38–126)
Anion gap: 8 (ref 5–15)
BUN: 14 mg/dL (ref 8–23)
CO2: 27 mmol/L (ref 22–32)
Calcium: 8.4 mg/dL — ABNORMAL LOW (ref 8.9–10.3)
Chloride: 106 mmol/L (ref 98–111)
Creatinine: 1.16 mg/dL (ref 0.61–1.24)
GFR, Estimated: >60 mL/min (ref >60)
Glucose, Bld: 158 mg/dL — ABNORMAL HIGH (ref 70–99)
Potassium: 3.6 mmol/L (ref 3.5–5.1)
Sodium: 141 mmol/L (ref 135–145)
Total Bilirubin: 0.6 mg/dL (ref <1.2)
Total Protein: 6.3 g/dL — ABNORMAL LOW (ref 6.5–8.1)

## 2023-08-07 LAB — FERRITIN: Ferritin: 60 ng/mL (ref 24–336)

## 2023-08-07 NOTE — Assessment & Plan Note (Signed)
Recurrent DVT and embolism, s/p IVC filter, s/p IVC filter removal  Continue long term anticoagulation with Eliquis 5mg  BID [ patient gets refills through PCP's office]. Also on Plavix.

## 2023-08-07 NOTE — Assessment & Plan Note (Signed)
Non invasive bladder cancer s/p  tx'd with TURBT in 2020 and then BCG, follow up with urology 

## 2023-08-07 NOTE — Assessment & Plan Note (Addendum)
Lab Results  Component Value Date   HGB 14.4 08/07/2023   TIBC 253 08/07/2023   IRONPCTSAT 29 08/07/2023   FERRITIN 60 08/07/2023  Stable iron store.   In the context of his CKD, I recommend patient to continue Vitron C 2-3 times per week.

## 2023-08-07 NOTE — Progress Notes (Signed)
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 He tolerates hydroxyurea well. Platelet is wnl, trending down.  Adjust current regimen to Hydroxyurea 500mg  daily for 6 days per week.     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 Also on Plavix.    Iron deficiency anemia Lab Results  Component Value Date   HGB 14.4 08/07/2023   TIBC 253 08/07/2023   IRONPCTSAT 29 08/07/2023   FERRITIN 60 08/07/2023  Stable iron store.   In the context of his CKD, I recommend patient to continue Vitron C 2-3 times per week.   Orders Placed This Encounter  Procedures   CBC with Differential (Cancer Center Only)    Standing Status:   Future    Standing Expiration Date:   08/06/2024   CMP (Cancer Center only)    Standing Status:   Future    Standing Expiration Date:   08/06/2024   Follow up  3 months lab MD  All questions were answered. The patient knows to call the clinic with any problems, questions or concerns.  [name], MD, PhD [facility] Hematology Oncology 08/07/2023      Pertinent hematology oncology history   Patient has significant history of previous DVT and PE.   Patient was seen by me in March 2020 and declined further follow-up. 2013 history of prostate cancer treated with cryo therapy  01/03/2016 history of superficial venous thrombosis demonstrated in the greater saphenous vein-.  Also history of  superficial thrombophlebitis of segment  of left the basal leg vein in the upper arm-08/24/2017.  No anticoagulation was recommended at that time due to the SVT features. 11/25/2018, right lower extremity acute DVT, started on Lovenox for about a week, 12/05/2018, patient underwent TURBT and intravesical instillation of gemcitabine for noninvasive low-grade papillary urothelial carcinoma.  Lovenox was held for 2 days prior and for 24 hours after procedure, and also started on Eliquis 12/15/2018, developed acute nonocclusive segmental and subsegmental pulmonary emboli within the right upper lobe, right middle lobe, right lower lobe pulmonary branches.  Patient was admitted to the hospital and started on heparin drip.  Patient was seen by me during his admission at that time.  Lovenox 1 mg/kg twice daily was recommended at discharge.  He declined further follow-up with me in the clinic.   His anticoagulation was managed by primary care provider Dr. Hyacinth Meeker.  Patient was on Coumadin for anticoagulation until Feb 2023.  Patient did not check INR since September 2022.  Patient also developed supratherapeutic INR due to interaction between Coumadin and Diflucan for treatment of esophageal candidiasis.  Patient was off Coumadin on 11/17/2021.  11/21/2021 developed acute nonocclusive DVT, started on Lovenox decision was made to switch to Eliquis.   03/09/2022, artificial urinary sphincter placement.  His Eliquis was held for 1 week prior to the procedure.  He has noticed left lower extremity pain 1 to 2 days after holding Eliquis. 03/11/2022, ER visit due to progressively worsening  left lower extremity pain. CT angio aortobifemoral with and without contrast showed eccentric thrombus in the left outflow vessels with distal occlusion of left renal vessels.  Delayed filling of the right popliteal artery is concerning for underlying occlusions and thromboembolic disease in the right runoff vessels Started on heparin drip in  ED. 03/11/2022, vascular surgeon lower extremity bypass surgery 03/12/2022, severe left lower extremity pain, return to the OR for left lower extremity thrombectomy.  Continued on heparin drip. 03/16/2022, left lower extremity extremity nonsalvageable.  Status post BKA.  Per my discussion with pharmacy, heparin was held around 9 AM on 03/16/2022 and resumed on 03/17/2022 9:30 PM. . 03/17/2022, acute respiratory failure, CT chest angiogram showed bilateral central pulm emboli involving the main pulmonary arteries and extending into all lobar branches with nearly occlusive clot to the right lower lobe, left upper lobe, several segmental branches.  CT evidence of right heart strain.Mildly increased sclerotic focus    03/18/2022, thrombolysis, mechanical thrombectomy.  Heparin gtt restarted after procedure. 03/19/2022, heparin gtt was continued. 03/19/2022, bilateral lower extremity ultrasound showed thrombosis of right femoral popliteal and calf veins.  Partially thrombosed left profunda femoris vein. 03/19/2022 IVC filter placement. 03/20/2022, still have significant hypoxia.  On heparin gtt. 03/21/2022 -03/22/2022, on  heparin gtt. 03/23/2022, CT angio chest.showed increased clot burden within right pulmonary artery and the right lower lobe pulmonary emboli with similar clot burden in the right middle lobe, right upper lobe, left lower lobe.  Single residual fibrin strand/clot at the bifurcation of the pulmonary artery with decreased clot burden in the left upper lobe artery branches.  Persistent right heart strain.  Findings suspicious for developing pulmonary infarcts. 03/23/2022, mechanical embolectomy of pulmonary arteries as well as right external iliac vein, common iliac vein and inferior vena cava.   Given that patient developed recurrent PE/DVT despite being therapeutic on heparin GTT, post thrombectomy, patient was switched to Angiomax and later started on Argatroban drip  Patient had  hypercoagulable work-up  done during the admission. Work-up showed negative factor V Leiden mutation, prothrombin gene imaging.  Negative anticardiolipin antibodies, lupus anticoagulants.  Negative beta-2 glycoprotein antibodies Positive JAK2 V6 92F mutation Patient was discharged on Eliquis.     06/01/22 Bone marrow biopsy showed  BONE MARROW, ASPIRATE, CLOT, CORE:  -Hypercellular marrow (60%) involved by a JAK2 mutated myeloproliferative neoplasm (see comment) Comment: The overall findings are consistent with a myeloproliferative neoplasm. Diagnostic considerations include essential thrombocythemia, polycythemia vera and pre- fibrotic primary myelofibrosis. Importantly, there is no overt increase in blasts or fibrosis.  However, correlation with erythropoietin levels, iron studies, presence/absence of splenomegaly and serum LDH along with cytogenetic studies would be  helpful for a more definitive assessment.   PERIPHERAL BLOOD:   -Thrombocytosis  - Mild leukocytosis  - Normocytic normochromic anemia   Cytogenetics normal  Status post IVC filter removal.   INTERVAL HISTORY Phillip [name] is a 77 y.o. male who has above history reviewed by me today presents for follow up visit for  recurrent thrombosis, Jak2 V692F mutation myeloproliferative disease  Patient tolerates Hydroxyurea 500 mg daily. He wears left lower extremity prosthetics.   Patient is on anticoagulation with Eliquis and Plavix.  Tolerates well.  No bleeding events.  MEDICAL HISTORY:  Past Medical History:  Diagnosis Date   Actinic keratosis    Arthritis    Basal cell carcinoma    L clavicle, L prox forearm- removed years ago    Bladder cancer [facility])    Bladder neck contracture  CAD (coronary artery disease)    a. 12/2020 NSTEMI/Cath: LM nl, LAD 95ost/p, LCX 50p, RCA nl. EF 45-50%.   Cataract    CKD (chronic kidney disease), stage III [facility])    ED (erectile dysfunction)    Frequency    GERD (gastroesophageal reflux disease)     History of pulmonary embolus (PE) 11/2018   a. Following LE DVT-->Chronic warfarin.   Hyperlipidemia LDL goal <70    Hypertension    Hypothyroidism    Incontinence of urine    sui, s/p cryoablation   Ischemic cardiomyopathy    a. 11/2018 Echo: EF 60-65%; b. 12/2020 LV Gram: EF 45-50% in setting of NSTEMI.   Kidney stones    Neuropathy    feet   Nocturia    Prostate cancer (HCC)    S/P   CRYOABLATION   Right elbow tendinitis    Right Lower Extremity DVT (deep venous thrombosis) (HCC) 11/25/2018   Vertigo    1-2x/yr   Wears glasses     SURGICAL HISTORY: Past Surgical History:  Procedure Laterality Date   AMPUTATION Left 03/16/2022   Procedure: AMPUTATION BELOW KNEE;  Surgeon: Renford Dills, MD;  Location: [facility];  Service: Vascular;  Laterality: Left;   ARTHROPLASTY AND TENDON REPAIR LEFT THUMB  JAN 2014   CATARACT EXTRACTION W/PHACO Left 11/16/2020   Procedure: CATARACT EXTRACTION PHACO AND INTRAOCULAR LENS PLACEMENT (IOC) LEFT  7.09 00:56.4 12.6%;  Surgeon: Lockie Mola, MD;  Location: [facility];  Service: Ophthalmology;  Laterality: Left;   CATARACT EXTRACTION W/PHACO Right 12/02/2020   Procedure: CATARACT EXTRACTION PHACO AND INTRAOCULAR LENS PLACEMENT (IOC) RIGHT MALYUGIN 5.52 00:49.7 11.1%;  Surgeon: Lockie Mola, MD;  Location: [facility];  Service: Ophthalmology;  Laterality: Right;   COLONOSCOPY     COLONOSCOPY WITH PROPOFOL N/A 08/09/2017   Procedure: COLONOSCOPY WITH PROPOFOL;  Surgeon: Scot Jun, MD;  Location: [facility] ENDOSCOPY;  Service: Endoscopy;  Laterality: N/A;   CORONARY ANGIOGRAPHY N/A 12/28/2020   Procedure: CORONARY ANGIOGRAPHY;  Surgeon: Tonny Bollman, MD;  Location: [facility] INVASIVE CV LAB;  Service: Cardiovascular;  Laterality: N/A;   CORONARY STENT INTERVENTION N/A 12/28/2020   Procedure: CORONARY STENT INTERVENTION;  Surgeon: Tonny Bollman, MD;  Location: [facility] INVASIVE CV LAB;  Service: Cardiovascular;  Laterality:  N/A;   ESOPHAGOGASTRODUODENOSCOPY (EGD) WITH PROPOFOL N/A 08/09/2017   Procedure: ESOPHAGOGASTRODUODENOSCOPY (EGD) WITH PROPOFOL;  Surgeon: Scot Jun, MD;  Location: [facility] ENDOSCOPY;  Service: Endoscopy;  Laterality: N/A;   GREEN LIGHT LASER TURP (TRANSURETHRAL RESECTION OF PROSTATE N/A 12/14/2015   Procedure: GREEN LIGHT LASER TURP (TRANSURETHRAL RESECTION OF PROSTATE) LASER OF BLADDER NECK CONTRACTURE;  Surgeon: Jethro Bolus, MD;  Location: [facility];  Service: Urology;  Laterality: N/A;   IVC FILTER INSERTION N/A 03/19/2022   Procedure: IVC FILTER INSERTION;  Surgeon: Nada Libman, MD;  Location: [facility] INVASIVE CV LAB;  Service: Cardiovascular;  Laterality: N/A;   IVC FILTER REMOVAL N/A 11/01/2022   Procedure: IVC FILTER REMOVAL;  Surgeon: Renford Dills, MD;  Location: [facility] INVASIVE CV LAB;  Service: Cardiovascular;  Laterality: N/A;   LEFT HEART CATH AND CORONARY ANGIOGRAPHY N/A 12/24/2020   Procedure: LEFT HEART CATH AND CORONARY ANGIOGRAPHY and possible PCI and stent;  Surgeon: Alwyn Pea, MD;  Location: [facility] INVASIVE CV LAB;  Service: Cardiovascular;  Laterality: N/A;   LEFT HEART CATH AND CORONARY ANGIOGRAPHY N/A 01/06/2021   Procedure: LEFT HEART CATH AND CORONARY ANGIOGRAPHY;  Surgeon: Lennette Bihari, MD;  [facility];  Service: Cardiovascular;  Laterality: N/A;   LOWER EXTREMITY ANGIOGRAPHY Left 03/11/2022   Procedure: Lower Extremity Angiography;  Surgeon: Renford Dills, MD;  Location: [facility] INVASIVE CV LAB;  Service: Cardiovascular;  Laterality: Left;   PENILE PROSTHESIS IMPLANT N/A 12/06/2013   Procedure: PENILE PROTHESIS INFLATABLE;  Surgeon: Kathi Ludwig, MD;  Location: [facility];  Service: Urology;  Laterality: N/A;   PROSTATE CRYOABLATION  06-01-2012  [facility]   PULMONARY THROMBECTOMY N/A 03/18/2022   Procedure: PULMONARY THROMBECTOMY;  Surgeon: Annice Needy, MD;  Location: [facility] INVASIVE CV LAB;   Service: Cardiovascular;  Laterality: N/A;   PULMONARY THROMBECTOMY Bilateral 03/23/2022   Procedure: PULMONARY THROMBECTOMY;  Surgeon: Renford Dills, MD;  Location: [facility] INVAS[facility] CV LAB;  Service: Cardiovascular;  Laterality: Bilateral;   THROMBECTOMY ILIAC ARTERY Left 03/12/2022   Procedure: THROMBECTOMY LEG;  Surgeon: Fransisco Hertz, MD;  Location: [facility];  Service: Vascular;  Laterality: Left;   TRANSURETHRAL RESECTION OF BLADDER NECK N/A 03/20/2015   Procedure: RELEASE BLADDER NECK CONTRACTURE  WITH WOLF BUTTON ELECTRODE ;  Surgeon: Jethro Bolus, MD;  Location: [facility] ;  Service: Urology;  Laterality: N/A;   TRANSURETHRAL RESECTION OF BLADDER TUMOR N/A 12/05/2018   Procedure: TRANSURETHRAL RESECTION OF BLADDER TUMOR (TURBT)/CYSTOSCOPY/  INSTILLATION OF Michelle Piper;  Surgeon: Rene Paci, MD;  Location: [facility];  Service: Urology;  Laterality: N/A;   TRANSURETHRAL RESECTION OF BLADDER TUMOR N/A 01/15/2020   Procedure: TRANSURETHRAL RESECTION OF BLADDER TUMOR (TURBT)/ CYSTOSCOPY;  Surgeon: Rene Paci, MD;  Location: [facility];  Service: Urology;  Laterality: N/A;   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      SOCIAL HISTORY: Social History   Socioeconomic History   Marital status: Married    Spouse name: Phillip [name]   Number of children: 3   Years of education: Not on file   Highest education level: Not on file  Occupational History   Not on file  Tobacco Use   Smoking status: Never    Passive exposure: Never   Smokeless tobacco: Never  Vaping Use   Vaping status: Never Used  Substance and Sexual Activity   Alcohol use: No   Drug use: No   Sexual activity: Not Currently    Birth control/protection: None  Other Topics Concern   Not on file  Social History Narrative   Lives locally with wife.  Exercises regularly - gym/golf.  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   3 sons: 1 is a Engineer, civil (consulting).    Social Determinants of Health   Financial Resource Strain: Low Risk  (06/16/2023)   Received from [facility]   Overall Financial Resource Strain (CARDIA)    Difficulty of Paying Living Expenses: Not hard at all  Food Insecurity: No Food Insecurity (06/16/2023)   [facility]   Hunger Vital Sign    Worried About Running Out of Food in the Last Year: Never true    Ran Out of Food in the Last Year: Never true  Transportation Needs: No Transportation Needs (06/16/2023)   Received from [facility] - Transportation    In the past 12 months, has lack of transportation kept you from medical appointments or from getting medications?: No    Lack of Transportation (Non-Medical): No  Physical Activity: Not on file  Stress:  Not on file  Social Connections: Not on file  Intimate Partner Violence: Not on file    FAMILY HISTORY: Family History  Problem Relation Age of Onset   CAD Father 35    ALLERGIES:  is allergic to doxazosin.  MEDICATIONS:  Current Outpatient Medications  Medication Sig Dispense Refill   acetaminophen (TYLENOL) 500 MG tablet Take 500 mg by mouth every 6 (six) hours as needed.     apixaban (ELIQUIS) 5 MG TABS tablet Take 1 tablet (5 mg total) by mouth 2 (two) times daily. 60 tablet 0   cetirizine (ZYRTEC) 10 MG tablet Take 10 mg by mouth daily.     clopidogrel (PLAVIX) 75 MG tablet Take 1 tablet (75 mg total) by mouth daily with breakfast. 90 tablet 3   co-enzyme Q-10 30 MG capsule Take 30 mg by mouth daily.     glipiZIDE (GLUCOTROL XL) 2.5 MG 24 hr tablet Take by mouth.     hydroxyurea (HYDREA) 500 MG capsule Take 1 capsule (500 mg total) by mouth daily. May take with food to minimize GI side effects. 90 capsule 3   Iron-Vitamin C (VITRON-C) 65-125 MG TABS Take 1 tablet by mouth daily. 90 tablet 1   levothyroxine (SYNTHROID) 50  MCG tablet Take 50 mcg by mouth.     metoprolol tartrate (LOPRESSOR) 25 MG tablet Take 0.5 tablets (12.5 mg total) by mouth 2 (two) times daily. 60 tablet 0   Multiple Vitamin (MULTIVITAMIN) tablet Take 1 tablet by mouth daily.     nitroGLYCERIN (NITROSTAT) 0.4 MG SL tablet Place 1 tablet (0.4 mg total) under the tongue every 5 (five) minutes x 3 doses as needed for chest pain. 25 tablet 3   pantoprazole (PROTONIX) 40 MG tablet Take 1 tablet (40 mg total) by mouth daily. 90 tablet 3   pravastatin (PRAVACHOL) 40 MG tablet Take 40 mg by mouth daily.     TRUE METRIX BLOOD GLUCOSE TEST test strip SMARTSIG:Via Meter     TRUEplus Lancets 33G MISC      Turmeric (QC TUMERIC COMPLEX PO) Take by mouth.     ascorbic acid (VITAMIN C) 500 MG tablet Take 500 mg by mouth daily. (Patient not taking: Reported on 05/08/2023)     testosterone cypionate (DEPOTESTOSTERONE CYPIONATE) 200 MG/ML injection Inject into the muscle. (Patient not taking: Reported on 05/08/2023)     No current facility-administered medications for this visit.    Review of Systems  Constitutional:  Negative for appetite change, chills, fatigue, fever and unexpected weight change.  HENT:   Negative for hearing loss and voice change.   Eyes:  Negative for eye problems and icterus.  Respiratory:  Negative for chest tightness, cough and shortness of breath.   Cardiovascular:  Negative for chest pain and leg swelling.  Gastrointestinal:  Negative for abdominal distention and abdominal pain.  Endocrine: Negative for hot flashes.  Genitourinary:  Negative for difficulty urinating, dysuria and frequency.   Musculoskeletal:  Negative for arthralgias.       Left BKA  Skin:  Negative for itching and rash.  Neurological:  Negative for light-headedness and numbness.  Hematological:  Negative for adenopathy. Does not bruise/bleed easily.  Psychiatric/Behavioral:  Negative for confusion.    PHYSICAL EXAMINATION:  Vitals:   08/07/23 1415 08/07/23  1432  BP: (!) 157/82 (!) 167/88  Pulse: 60   Resp: 18   Temp: (!) 95.6 F (35.3 C)   SpO2: 97%    Filed Weights   08/07/23 1415  Weight:  174 lb 8 oz (79.2 kg)    Physical Exam Constitutional:      General: He is not in acute distress. HENT:     Head: Normocephalic and atraumatic.  Eyes:     General: No scleral icterus. Cardiovascular:     Rate and Rhythm: Normal rate.  Pulmonary:     Effort: Pulmonary effort is normal. No respiratory distress.     Breath sounds: No wheezing.  Abdominal:     General: Bowel sounds are normal. There is no distension.     Palpations: Abdomen is soft.  Musculoskeletal:        General: Normal range of motion.     Cervical back: Normal range of motion and neck supple.     Comments: Status post left BKA, + left lower limb prosthetics.   Skin:    General: Skin is warm and dry.     Findings: No erythema or rash.  Neurological:     Mental Status: He is alert and oriented to person, place, and time. Mental status is at baseline.     Cranial Nerves: No cranial nerve deficit.     Coordination: Coordination normal.  Psychiatric:        Mood and Affect: Mood normal.     LABORATORY DATA:  I have reviewed the data as listed    Latest Ref Rng & Units 08/07/2023    2:05 PM 05/08/2023    2:43 PM 01/18/2023   10:17 AM  CBC  WBC 4.0 - 10.5 K/uL 9.3  8.6  7.4   Hemoglobin 13.0 - 17.0 g/dL 40.9  81.1  91.4   Hematocrit 39.0 - 52.0 % 44.0  45.2  46.1   Platelets 150 - 400 K/uL 273  313  259       Latest Ref Rng & Units 08/07/2023    2:05 PM 05/08/2023    2:43 PM 01/18/2023   10:17 AM  CMP  Glucose 70 - 99 mg/dL 782  956  213   BUN 8 - 23 mg/dL 14  21  19    Creatinine 0.61 - 1.24 mg/dL 0.86  5.78  4.69   Sodium 135 - 145 mmol/L 141  137  137   Potassium 3.5 - 5.1 mmol/L 3.6  3.9  3.6   Chloride 98 - 111 mmol/L 106  107  106   CO2 22 - 32 mmol/L 27  25  25    Calcium 8.9 - 10.3 mg/dL 8.4  8.4  8.8   Total Protein 6.5 - 8.1 g/dL 6.3  6.0  6.8    Total Bilirubin <1.2 mg/dL 0.6  0.7  0.9   Alkaline Phos 38 - 126 U/L 48  42  53   AST 15 - 41 U/L 22  22  18    ALT 0 - 44 U/L 23  32  15       RADIOGRAPHIC STUDIES: I have personally reviewed the radiological images as listed and agreed with the findings in the report. No results found.

## 2023-08-07 NOTE — Assessment & Plan Note (Addendum)
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 He tolerates hydroxyurea well. Platelet is wnl, trending down.  Adjust current regimen to Hydroxyurea 500mg  daily for 6 days per week.

## 2023-09-28 ENCOUNTER — Other Ambulatory Visit (INDEPENDENT_AMBULATORY_CARE_PROVIDER_SITE_OTHER): Payer: Self-pay | Admitting: Nurse Practitioner

## 2023-09-28 DIAGNOSIS — Z89512 Acquired absence of left leg below knee: Secondary | ICD-10-CM

## 2023-09-28 DIAGNOSIS — I7090 Unspecified atherosclerosis: Secondary | ICD-10-CM

## 2023-10-02 ENCOUNTER — Encounter (INDEPENDENT_AMBULATORY_CARE_PROVIDER_SITE_OTHER): Payer: Self-pay | Admitting: Nurse Practitioner

## 2023-10-02 ENCOUNTER — Ambulatory Visit (INDEPENDENT_AMBULATORY_CARE_PROVIDER_SITE_OTHER): Payer: HMO

## 2023-10-02 ENCOUNTER — Ambulatory Visit (INDEPENDENT_AMBULATORY_CARE_PROVIDER_SITE_OTHER): Payer: HMO | Admitting: Nurse Practitioner

## 2023-10-02 VITALS — BP 144/72 | HR 72 | Resp 16 | Wt 177.8 lb

## 2023-10-02 DIAGNOSIS — I7090 Unspecified atherosclerosis: Secondary | ICD-10-CM

## 2023-10-02 DIAGNOSIS — Z89512 Acquired absence of left leg below knee: Secondary | ICD-10-CM

## 2023-10-02 DIAGNOSIS — I10 Essential (primary) hypertension: Secondary | ICD-10-CM | POA: Diagnosis not present

## 2023-10-02 NOTE — Progress Notes (Signed)
 Subjective:    Patient ID: Phillip [name], male    DOB: [birth year], 78 y.o.   MRN: [id number] Chief Complaint  Patient presents with   Follow-up    6 month abi follow up    Phillip [name] is a 78 year old male that presents today for evaluation of his ABIs of his right lower extremity.  He has a previous left below-knee amputation.  His prosthetic was recently revised and he notes that he has a much better quality of life however recently he has been having some issues with it especially behind his knee.  He does have a follow-up soon to have this evaluated.  He denies claudication-like symptoms..  Currently no rest pain or ulcerations present.  Today the patient has ABI of 1.16 on the right with a TBI 0.96.  Previously the patient had a ABI 1.28 on the right and 0.7 on the left.  He has strong triphasic tibial artery waveforms with normal toe waveforms    Review of Systems  Musculoskeletal:  Positive for gait problem.  Skin:  Negative for wound.  Neurological:  Positive for numbness.  All other systems reviewed and are negative.      Objective:   Physical Exam Vitals reviewed.  HENT:     Head: Normocephalic.  Cardiovascular:     Rate and Rhythm: Normal rate.  Pulmonary:     Effort: Pulmonary effort is normal.  Musculoskeletal:     Left Lower Extremity: Left leg is amputated below knee.  Skin:    General: Skin is warm and dry.  Neurological:     Mental Status: He is alert and oriented to person, place, and time.  Psychiatric:        Mood and Affect: Mood normal.        Behavior: Behavior normal.        Thought Content: Thought content normal.        Judgment: Judgment normal.     BP (!) 144/72   Pulse 72   Resp 16   Wt 177 lb 12.8 oz (80.6 kg)   BMI 27.03 kg/m   Past Medical History:  Diagnosis Date   Actinic keratosis    Arthritis    Basal cell carcinoma    L clavicle, L prox forearm- removed years ago    Bladder cancer (HCC)    Bladder neck contracture     CAD (coronary artery disease)    a. 12/2020 NSTEMI/Cath: LM nl, LAD 95ost/p, LCX 50p, RCA nl. EF 45-50%.   Cataract    CKD (chronic kidney disease), stage III [facility])    ED (erectile dysfunction)    Frequency    GERD (gastroesophageal reflux disease)    History of pulmonary embolus (PE) 11/2018   a. Following LE DVT-->Chronic warfarin.   Hyperlipidemia LDL goal <70    Hypertension    Hypothyroidism    Incontinence of urine    sui, s/p cryoablation   Ischemic cardiomyopathy    a. 11/2018 Echo: EF 60-65%; b. 12/2020 LV Gram: EF 45-50% in setting of NSTEMI.   Kidney stones    Neuropathy    feet   Nocturia    Prostate cancer (HCC)    S/P   CRYOABLATION   Right elbow tendinitis    Right Lower Extremity DVT (deep venous thrombosis) (HCC) 11/25/2018   Vertigo    1-2x/yr   Wears glasses     Social History   Socioeconomic History   Marital status: Married    Spouse  name: Arel Tippen   Number of children: 3   Years of education: Not on file   Highest education level: Not on file  Occupational History   Not on file  Tobacco Use   Smoking status: Never    Passive exposure: Never   Smokeless tobacco: Never  Vaping Use   Vaping status: Never Used  Substance and Sexual Activity   Alcohol use: No   Drug use: No   Sexual activity: Not Currently    Birth control/protection: None  Other Topics Concern   Not on file  Social History Narrative   Lives locally with wife.  Exercises regularly - gym/golf.  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   3 sons: 1 is a engineer, civil (consulting).    Social Drivers of Corporate Investment Banker Strain: Low Risk  (06/16/2023)   Received from [facility]   Overall Financial Resource Strain (CARDIA)    Difficulty of Paying Living Expenses: Not hard at all  Food Insecurity: No Food Insecurity (06/16/2023)   Received from [facility]   Hunger Vital Sign    Worried About Running Out of Food in  the Last Year: Never true    Ran Out of Food in the Last Year: Never true  Transportation Needs: No Transportation Needs (06/16/2023)   Received from [facility]    In the past 12 months, has lack of transportation kept you from medical appointments or from getting medications?: No    Lack of Transportation (Non-Medical): No  Physical Activity: Not on file  Stress: Not on file  Social Connections: Not on file  Intimate Partner Violence: Not on file    Past Surgical History:  Procedure Laterality Date   AMPUTATION Left 03/16/2022   Procedure: AMPUTATION BELOW KNEE;  Surgeon: Jama Cordella MATSU, MD;  Location: [facility];  Service: Vascular;  Laterality: Left;   ARTHROPLASTY AND TENDON REPAIR LEFT THUMB  JAN 2014   CATARACT EXTRACTION W/PHACO Left 11/16/2020   Procedure: CATARACT EXTRACTION PHACO AND INTRAOCULAR LENS PLACEMENT (IOC) LEFT  7.09 00:56.4 12.6%;  Surgeon: Mittie Gaskin, MD;  Location: [facility];  Service: Ophthalmology;  Laterality: Left;   CATARACT EXTRACTION W/PHACO Right 12/02/2020   Procedure: CATARACT EXTRACTION PHACO AND INTRAOCULAR LENS PLACEMENT (IOC) RIGHT MALYUGIN 5.52 00:49.7 11.1%;  Surgeon: Mittie Gaskin, MD;  Location: [facility];  Service: Ophthalmology;  Laterality: Right;   COLONOSCOPY     COLONOSCOPY WITH PROPOFOL  N/A 08/09/2017   Procedure: COLONOSCOPY WITH PROPOFOL ;  Surgeon: Viktoria Lamar DASEN, MD;  Location: [facility] ENDOSCOPY;  Service: Endoscopy;  Laterality: N/A;   CORONARY ANGIOGRAPHY N/A 12/28/2020   Procedure: CORONARY ANGIOGRAPHY;  Surgeon: Wonda Sharper, MD;  Location: [facility] INVASIVE CV LAB;  Service: Cardiovascular;  Laterality: N/A;   CORONARY STENT INTERVENTION N/A 12/28/2020   Procedure: CORONARY STENT INTERVENTION;  Surgeon: Wonda Sharper, MD;  Location: [facility] INVASIVE CV LAB;  Service: Cardiovascular;  Laterality: N/A;   ESOPHAGOGASTRODUODENOSCOPY (EGD) WITH PROPOFOL  N/A  08/09/2017   Procedure: ESOPHAGOGASTRODUODENOSCOPY (EGD) WITH PROPOFOL ;  Surgeon: Viktoria Lamar DASEN, MD;  Location: [facility] ENDOSCOPY;  Service: Endoscopy;  Laterality: N/A;   GREEN LIGHT LASER TURP (TRANSURETHRAL RESECTION OF PROSTATE N/A 12/14/2015   Procedure: GREEN LIGHT LASER TURP (TRANSURETHRAL RESECTION OF PROSTATE) LASER OF BLADDER NECK CONTRACTURE;  Surgeon: Arlena Gal, MD;  Location: [facility];  Service: Urology;  Laterality: N/A;   IVC FILTER INSERTION N/A  03/19/2022   Procedure: IVC FILTER INSERTION;  Surgeon: Serene Gaile ORN, MD;  Location: [facility] INVASIVE CV LAB;  Service: Cardiovascular;  Laterality: N/A;   IVC FILTER REMOVAL N/A 11/01/2022   Procedure: IVC FILTER REMOVAL;  Surgeon: Jama Cordella MATSU, MD;  Location: [facility] INVASIVE CV LAB;  Service: Cardiovascular;  Laterality: N/A;   LEFT HEART CATH AND CORONARY ANGIOGRAPHY N/A 12/24/2020   Procedure: LEFT HEART CATH AND CORONARY ANGIOGRAPHY and possible PCI and stent;  Surgeon: Florencio Cara BIRCH, MD;  Location: [facility] INVASIVE CV LAB;  Service: Cardiovascular;  Laterality: N/A;   LEFT HEART CATH AND CORONARY ANGIOGRAPHY N/A 01/06/2021   Procedure: LEFT HEART CATH AND CORONARY ANGIOGRAPHY;  Surgeon: Burnard Debby LABOR, MD;  Location: [facility] INVASIVE CV LAB;  Service: Cardiovascular;  Laterality: N/A;   LOWER EXTREMITY ANGIOGRAPHY Left 03/11/2022   Procedure: Lower Extremity Angiography;  Surgeon: Jama Cordella MATSU, MD;  Location: [facility] INVASIVE CV LAB;  Service: Cardiovascular;  Laterality: Left;   PENILE PROSTHESIS IMPLANT N/A 12/06/2013   Procedure: PENILE PROTHESIS INFLATABLE;  Surgeon: Arlena LILLETTE Gal, MD;  Location[facility];  Service: Urology;  Laterality: N/A;   PROSTATE CRYOABLATION  06-01-2012  [facility]   PULMONARY THROMBECTOMY N/A 03/18/2022   Procedure: PULMONARY THROMBECTOMY;  Surgeon: Marea Selinda RAMAN, MD;  Location: [facility] INVASIVE CV LAB;  Service: Cardiovascular;  Laterality: N/A;   PULMONARY  THROMBECTOMY Bilateral 03/23/2022   Procedure: PULMONARY THROMBECTOMY;  Surgeon: Jama Cordella MATSU, MD;  Location: [facility] INVASIVE CV LAB;  Service: Cardiovascular;  Laterality: Bilateral;   THROMBECTOMY ILIAC ARTERY Left 03/12/2022   Procedure: THROMBECTOMY LEG;  Surgeon: Laurence Redell CROME, MD;  Location: [facility];  Service: Vascular;  Laterality: Left;   TRANSURETHRAL RESECTION OF BLADDER NECK N/A 03/20/2015   Procedure: RELEASE BLADDER NECK CONTRACTURE  WITH WOLF BUTTON ELECTRODE ;  Surgeon: Arlena Gal, MD;  Location: [facility];  Service: Urology;  Laterality: N/A;   TRANSURETHRAL RESECTION OF BLADDER TUMOR N/A 12/05/2018   Procedure: TRANSURETHRAL RESECTION OF BLADDER TUMOR (TURBT)/CYSTOSCOPY/  INSTILLATION OF LEOCADIA;  Surgeon: Devere Lonni Righter, MD;  Location: [facility];  Service: Urology;  Laterality: N/A;   TRANSURETHRAL RESECTION OF BLADDER TUMOR N/A 01/15/2020   Procedure: TRANSURETHRAL RESECTION OF BLADDER TUMOR (TURBT)/ CYSTOSCOPY;  Surgeon: Devere Lonni Righter, MD;  Location: [facility];  Service: Urology;  Laterality: N/A;   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      Family History  Problem Relation Age of Onset   CAD Father 4    Allergies  Allergen Reactions   Doxazosin Other (See Comments)    Other reaction(s): Unknown       Latest Ref Rng & Units 08/07/2023    2:05 PM 05/08/2023    2:43 PM 01/18/2023   10:17 AM  CBC  WBC 4.0 - 10.5 K/uL 9.3  8.6  7.4   Hemoglobin 13.0 - 17.0 g/dL 85.5  85.0  84.7   Hematocrit 39.0 - 52.0 % 44.0  45.2  46.1   Platelets 150 - 400 K/uL 273  313  259       CMP     Component Value Date/Time   NA 141 08/07/2023 1405   NA 141 10/25/2014 2206   K 3.6 08/07/2023 1405   K 4.0 10/25/2014 2206   CL 106 08/07/2023 1405   CL 109 (H) 10/25/2014 2206   CO2 27 08/07/2023 1405   CO2 26 10/25/2014 2206   GLUCOSE  158 (H) 08/07/2023 1405   GLUCOSE 210  (H) 10/25/2014 2206   BUN 14 08/07/2023 1405   BUN 21 08/15/2016 1618   BUN 15 10/25/2014 2206   CREATININE 1.16 08/07/2023 1405   CREATININE 1.32 (H) 10/25/2014 2206   CALCIUM  8.4 (L) 08/07/2023 1405   CALCIUM  8.2 (L) 10/25/2014 2206   PROT 6.3 (L) 08/07/2023 1405   PROT 7.0 10/25/2014 2206   ALBUMIN  3.5 08/07/2023 1405   ALBUMIN  3.2 (L) 10/25/2014 2206   AST 22 08/07/2023 1405   ALT 23 08/07/2023 1405   ALT 22 10/25/2014 2206   ALKPHOS 48 08/07/2023 1405   ALKPHOS 58 10/25/2014 2206   BILITOT 0.6 08/07/2023 1405   GFRNONAA >60 08/07/2023 1405   GFRNONAA 58 (L) 10/25/2014 2206     No results found.     Assessment & Plan:   1. Hx of BKA, left (HCC) Patient is doing well with ambulation in regards to his prosthesis.  He will follow-up with specialist for current fit issues 2. Primary hypertension Continue antihypertensive medications as already ordered, these medications have been reviewed and there are no changes at this time.  3. Arterial occlusion The patient has had ABIs in the past which have been not significant for any worsening symptoms however given his history of amputation, we will continue to maintain evaluation of his lower extremity perfusion in order to evaluate before issues begin.  He will return in 6 months with noninvasive studies.    Current Outpatient Medications on File Prior to Visit  Medication Sig Dispense Refill   acetaminophen  (TYLENOL ) 500 MG tablet Take 500 mg by mouth every 6 (six) hours as needed.     Alpha-Lipoic Acid 600 MG TABS Take 1 tablet by mouth daily.     apixaban  (ELIQUIS ) 5 MG TABS tablet Take 1 tablet (5 mg total) by mouth 2 (two) times daily. 60 tablet 0   cetirizine (ZYRTEC) 10 MG tablet Take 10 mg by mouth daily.     clopidogrel  (PLAVIX ) 75 MG tablet Take 1 tablet (75 mg total) by mouth daily with breakfast. 90 tablet 3   co-enzyme Q-10 30 MG capsule Take 30 mg by mouth daily.     glipiZIDE (GLUCOTROL XL) 2.5 MG 24 hr tablet  Take by mouth.     hydroxyurea  (HYDREA ) 500 MG capsule Take 1 capsule (500 mg total) by mouth daily. May take with food to minimize GI side effects. 90 capsule 3   Iron -Vitamin C (VITRON-C) 65-125 MG TABS Take 1 tablet by mouth daily. 90 tablet 1   levothyroxine  (SYNTHROID ) 50 MCG tablet Take 50 mcg by mouth.     metoprolol  tartrate (LOPRESSOR ) 25 MG tablet Take 0.5 tablets (12.5 mg total) by mouth 2 (two) times daily. 60 tablet 0   Multiple Vitamin (MULTIVITAMIN) tablet Take 1 tablet by mouth daily.     nitroGLYCERIN  (NITROSTAT ) 0.4 MG SL tablet Place 1 tablet (0.4 mg total) under the tongue every 5 (five) minutes x 3 doses as needed for chest pain. 25 tablet 3   pantoprazole  (PROTONIX ) 40 MG tablet Take 1 tablet (40 mg total) by mouth daily. 90 tablet 3   pravastatin  (PRAVACHOL ) 40 MG tablet Take 40 mg by mouth daily.     TRUE METRIX BLOOD GLUCOSE TEST test strip SMARTSIG:Via Meter     TRUEplus Lancets 33G MISC      Turmeric (QC TUMERIC COMPLEX PO) Take by mouth.     ascorbic acid (VITAMIN C) 500 MG tablet Take 500  mg by mouth daily. (Patient not taking: Reported on 05/08/2023)     testosterone  cypionate (DEPOTESTOSTERONE CYPIONATE) 200 MG/ML injection Inject into the muscle. (Patient not taking: Reported on 10/02/2023)     No current facility-administered medications on file prior to visit.    There are no Patient Instructions on file for this visit. No follow-ups on file.   Jacque Garrels E Dreshawn Hendershott, NP

## 2023-10-03 LAB — VAS US ABI WITH/WO TBI: Right ABI: 1.16

## 2023-10-07 IMAGING — MR MR CERVICAL SPINE W/O CM
5 series · 36 of 48 positions shown · non-contrast
Comparison: No pertinent prior exams available for comparison.

CLINICAL DATA: Provided history: Spinal stenosis of cervical
region. Cervical disc disorder with radiculopathy. Additional
history provided by scanning technologist: Patient reports left
greater than right shoulder and upper arm pain for several months.

EXAM:
MRI CERVICAL SPINE WITHOUT CONTRAST
TECHNIQUE: Multiplanar, multisequence MR imaging of the cervical spine was
performed. No intravenous contrast was administered.

[Series 5: T2 · sagittal · 3.0mm · 0.62mm/px · 7 of 15 slices shown (1 of 2)]
[im 1/15]
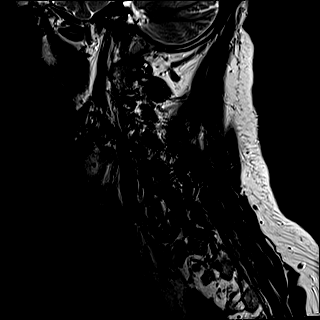
[im 3/15]
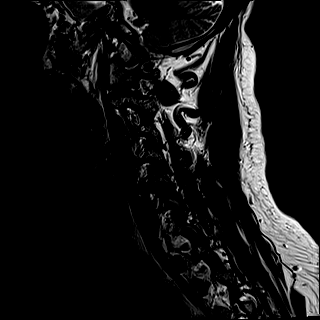
[im 5/15]
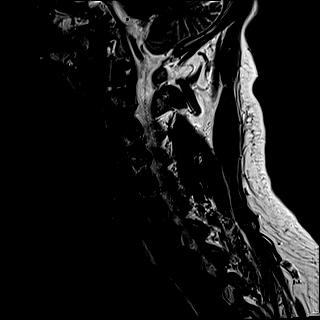
[im 8/15]
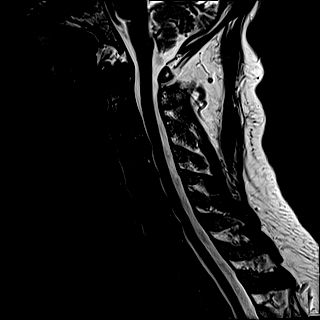
[im 10/15]
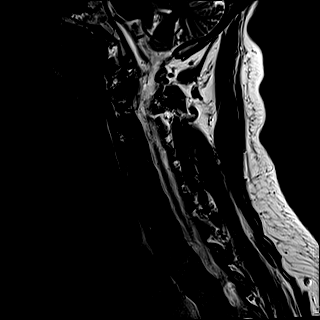
[im 12/15]
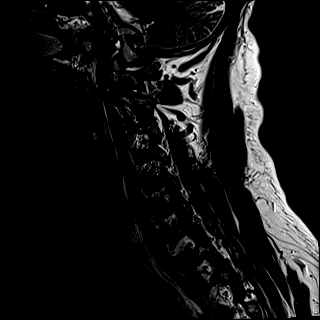
[im 15/15]
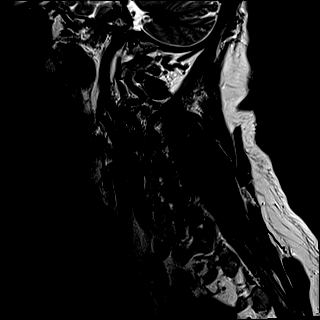

[Series 6: FLAIR · sagittal · 3.0mm · 0.78mm/px · 7 of 15 slices shown]
[im 1/15]
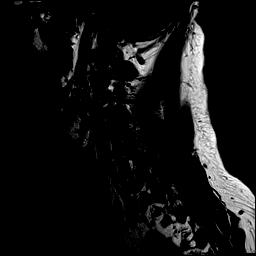
[im 3/15]
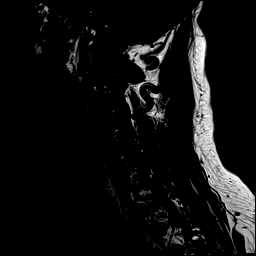
[im 5/15]
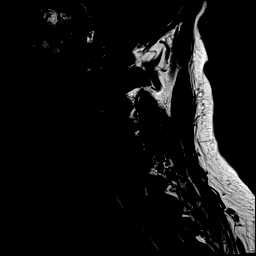
[im 8/15]
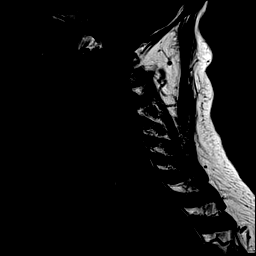
[im 10/15]
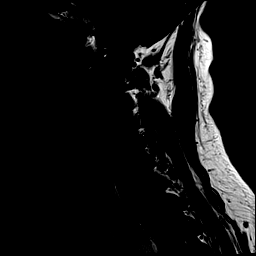
[im 12/15]
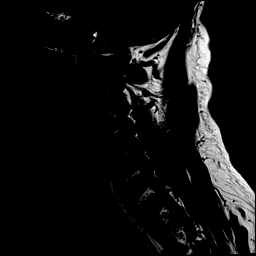
[im 15/15]
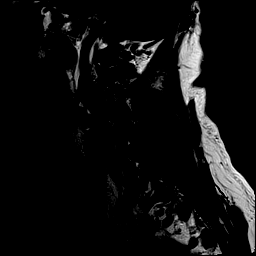

[Series 7: STIR · sagittal · 3.0mm · 0.62mm/px · 7 of 15 slices shown]
[im 1/15]
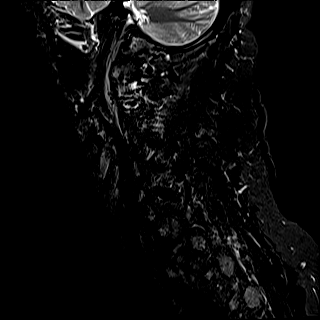
[im 3/15]
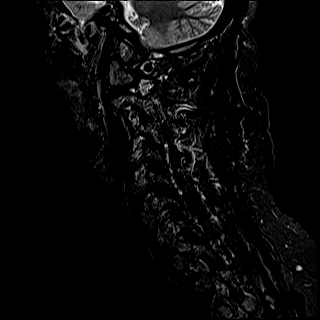
[im 5/15]
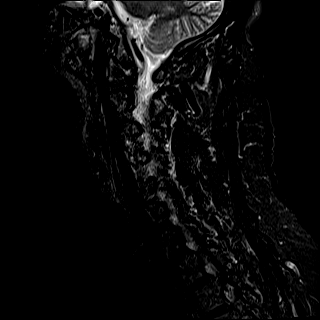
[im 8/15]
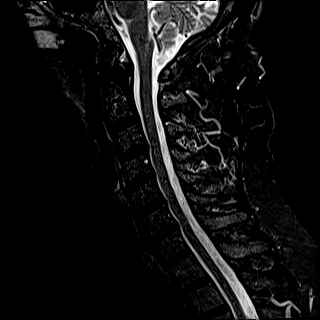
[im 10/15]
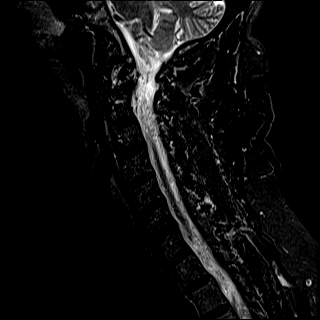
[im 12/15]
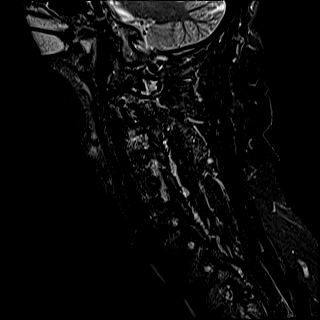
[im 15/15]
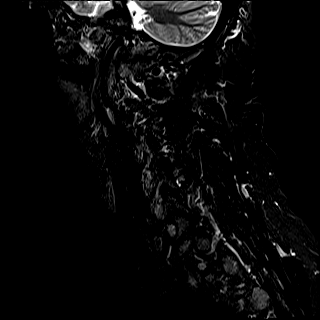

[Series 8: T2 · axial · 3.0mm · 0.70mm/px · z∈[-121,-20]mm · 8 of 31 slices shown (2 of 2)]
[im 1/31]
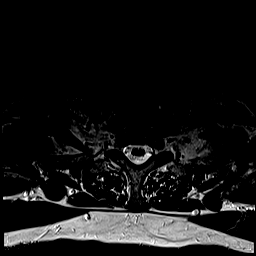
[im 5/31]
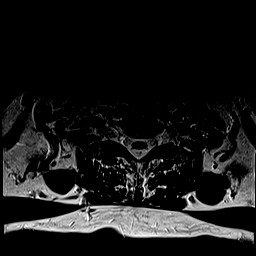
[im 10/31]
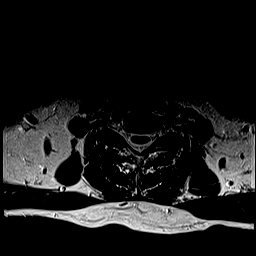
[im 14/31]
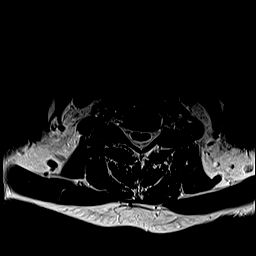
[im 17/31]
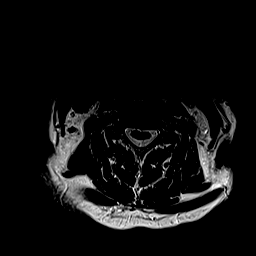
[im 21/31]
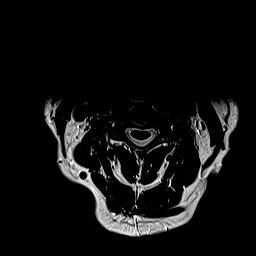
[im 26/31]
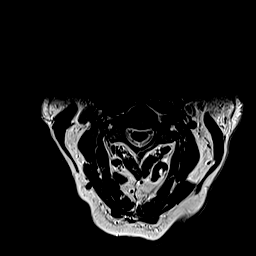
[im 31/31]
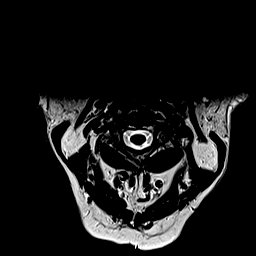

[Series 9: ax mpgr · axial · 3.0mm · 0.35mm/px · z∈[-118,-40]mm · 7 of 29 slices shown]
[im 1/29]
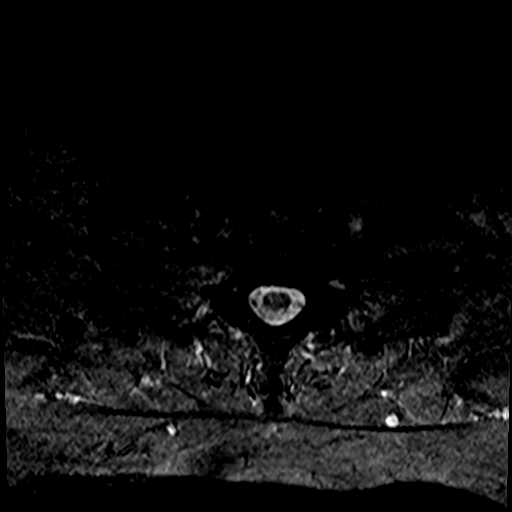
[im 5/29]
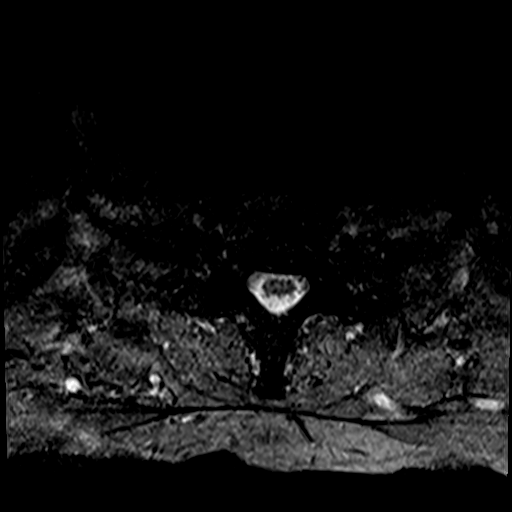
[im 10/29]
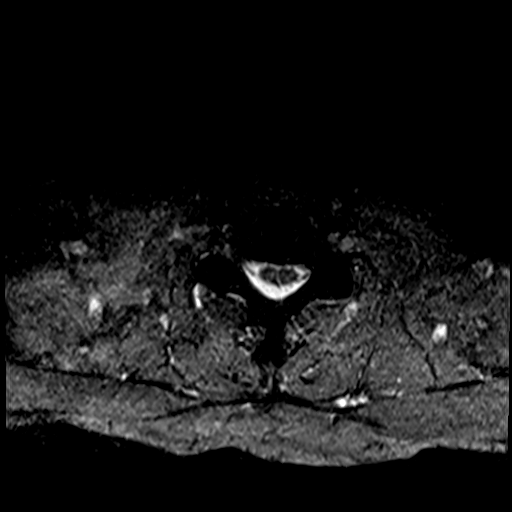
[im 12/29]
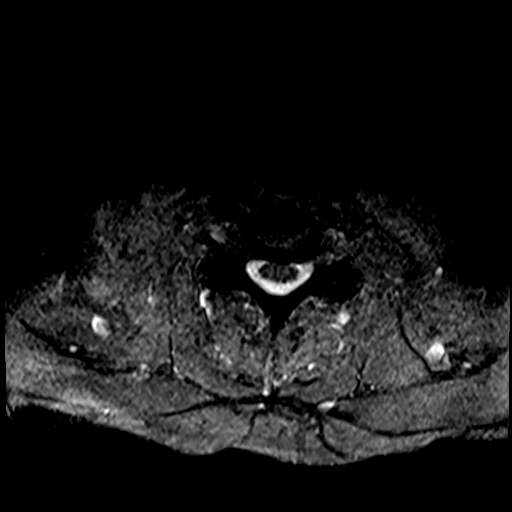
[im 17/29]
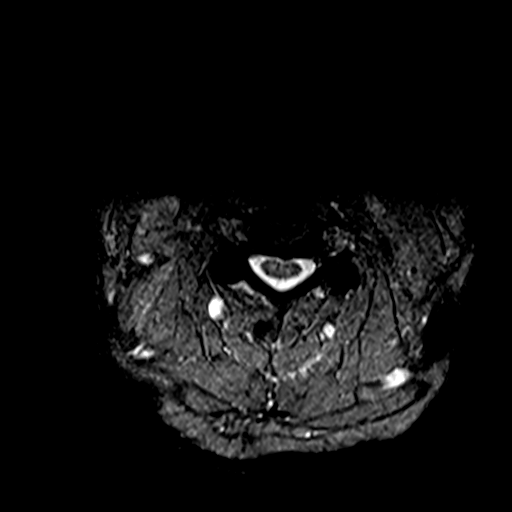
[im 19/29]
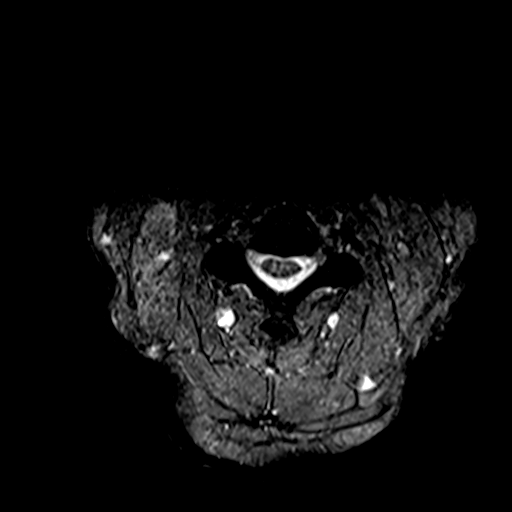
[im 24/29]
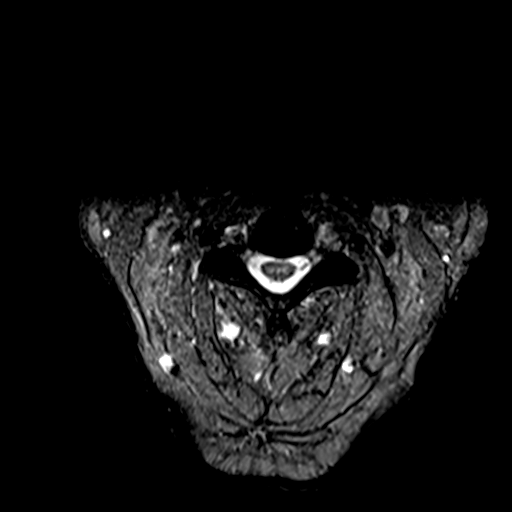

[36 of 48 positions shown; findings below may reference images not displayed]

FINDINGS: Alignment: Trace C3-C4 grade 1 retrolisthesis.

Vertebrae: Vertebral body height is maintained. No significant
marrow edema or focal suspicious osseous lesion.

Cord: No signal abnormality identified within the cervical spinal
cord. Minimal flattening of the ventral spinal cord at multiple
levels, as described below.

Posterior Fossa, vertebral arteries, paraspinal tissues: No
abnormality identified within included portions of the posterior
fossa. Flow voids preserved within the imaged cervical vertebral
arteries. Paraspinal soft tissues unremarkable.

Disc levels:

No more than mild disc degeneration.

C2-C3: Mild facet arthrosis. No significant disc herniation or
stenosis.

C3-C4: Trace grade 1 retrolisthesis. Small central disc protrusion.
Mild facet arthrosis. The disc protrusion results in mild focal
effacement of the ventral thecal sac, contacting and minimally
flattening the ventral aspect of the spinal cord. However, the
dorsal CSF space is maintained within the spinal canal. No
significant foraminal stenosis.

C4-C5: Tiny left center disc protrusion. Mild facet arthrosis. The
disc protrusion results in mild focal effacement of ventral thecal
sac, contacting and minimally flattening the ventral aspect of the
spinal cord. No significant foraminal stenosis.

C5-C6: Tiny central disc protrusion. Facet arthrosis (mild right,
moderate left). The disc protrusion results in mild focal effacement
of the ventral thecal sac, contacting and minimally flattening the
ventral aspect of the spinal cord. No significant foraminal
stenosis.

C6-C7: Tiny central disc protrusion. Mild facet arthrosis. The disc
protrusion results in mild focal effacement of the ventral thecal
sac, contacting and minimally flattening the ventral aspect of the
spinal cord. No significant foraminal stenosis.

C7-T1: No significant disc herniation or stenosis.
IMPRESSION: Cervical spondylosis, as outlined. No more than mild spinal canal
narrowing. Most notably, small central disc protrusions contact and
minimally flatten the ventral aspect of the spinal cord at C3-C4,
C4-C5, C5-C6 and C6-C7. No significant foraminal stenosis. No more
than mild disc degeneration. Multilevel facet arthrosis, greatest on
the left at C5-C6.

Nonspecific straightening of the expected cervical lordosis.

Trace C3-C4 grade 1 retrolisthesis.

## 2023-10-10 DIAGNOSIS — Z89512 Acquired absence of left leg below knee: Secondary | ICD-10-CM | POA: Diagnosis not present

## 2023-10-10 DIAGNOSIS — I479 Paroxysmal tachycardia, unspecified: Secondary | ICD-10-CM | POA: Diagnosis not present

## 2023-10-10 DIAGNOSIS — E782 Mixed hyperlipidemia: Secondary | ICD-10-CM | POA: Diagnosis not present

## 2023-10-10 DIAGNOSIS — Z0000 Encounter for general adult medical examination without abnormal findings: Secondary | ICD-10-CM | POA: Diagnosis not present

## 2023-10-10 DIAGNOSIS — E1169 Type 2 diabetes mellitus with other specified complication: Secondary | ICD-10-CM | POA: Diagnosis not present

## 2023-11-14 DIAGNOSIS — M6281 Muscle weakness (generalized): Secondary | ICD-10-CM | POA: Diagnosis not present

## 2023-11-14 DIAGNOSIS — Z89512 Acquired absence of left leg below knee: Secondary | ICD-10-CM | POA: Diagnosis not present

## 2023-11-15 ENCOUNTER — Inpatient Hospital Stay (HOSPITAL_BASED_OUTPATIENT_CLINIC_OR_DEPARTMENT_OTHER): Payer: HMO | Admitting: Oncology

## 2023-11-15 ENCOUNTER — Inpatient Hospital Stay: Payer: HMO | Attending: Oncology

## 2023-11-15 ENCOUNTER — Encounter: Payer: Self-pay | Admitting: Oncology

## 2023-11-15 VITALS — BP 138/75 | HR 67 | Temp 96.0°F | Resp 18 | Wt 174.1 lb

## 2023-11-15 DIAGNOSIS — N183 Chronic kidney disease, stage 3 unspecified: Secondary | ICD-10-CM | POA: Insufficient documentation

## 2023-11-15 DIAGNOSIS — D471 Chronic myeloproliferative disease: Secondary | ICD-10-CM | POA: Diagnosis not present

## 2023-11-15 DIAGNOSIS — D509 Iron deficiency anemia, unspecified: Secondary | ICD-10-CM | POA: Diagnosis not present

## 2023-11-15 DIAGNOSIS — Z7901 Long term (current) use of anticoagulants: Secondary | ICD-10-CM | POA: Insufficient documentation

## 2023-11-15 DIAGNOSIS — D508 Other iron deficiency anemias: Secondary | ICD-10-CM | POA: Diagnosis not present

## 2023-11-15 DIAGNOSIS — D473 Essential (hemorrhagic) thrombocythemia: Secondary | ICD-10-CM | POA: Diagnosis not present

## 2023-11-15 DIAGNOSIS — Z86718 Personal history of other venous thrombosis and embolism: Secondary | ICD-10-CM | POA: Insufficient documentation

## 2023-11-15 DIAGNOSIS — I82409 Acute embolism and thrombosis of unspecified deep veins of unspecified lower extremity: Secondary | ICD-10-CM | POA: Diagnosis not present

## 2023-11-15 LAB — CBC WITH DIFFERENTIAL (CANCER CENTER ONLY)
Abs Immature Granulocytes: 0.02 10*3/uL (ref 0.00–0.07)
Basophils Absolute: 0.1 10*3/uL (ref 0.0–0.1)
Basophils Relative: 1 %
Eosinophils Absolute: 0.2 10*3/uL (ref 0.0–0.5)
Eosinophils Relative: 3 %
HCT: 44.5 % (ref 39.0–52.0)
Hemoglobin: 14.5 g/dL (ref 13.0–17.0)
Immature Granulocytes: 0 %
Lymphocytes Relative: 24 %
Lymphs Abs: 1.6 10*3/uL (ref 0.7–4.0)
MCH: 34.3 pg — ABNORMAL HIGH (ref 26.0–34.0)
MCHC: 32.6 g/dL (ref 30.0–36.0)
MCV: 105.2 fL — ABNORMAL HIGH (ref 80.0–100.0)
Monocytes Absolute: 0.5 10*3/uL (ref 0.1–1.0)
Monocytes Relative: 7 %
Neutro Abs: 4.5 10*3/uL (ref 1.7–7.7)
Neutrophils Relative %: 65 %
Platelet Count: 287 10*3/uL (ref 150–400)
RBC: 4.23 MIL/uL (ref 4.22–5.81)
RDW: 13.2 % (ref 11.5–15.5)
WBC Count: 6.9 10*3/uL (ref 4.0–10.5)
nRBC: 0 % (ref 0.0–0.2)

## 2023-11-15 LAB — CMP (CANCER CENTER ONLY)
ALT: 22 U/L (ref 0–44)
AST: 23 U/L (ref 15–41)
Albumin: 3.7 g/dL (ref 3.5–5.0)
Alkaline Phosphatase: 47 U/L (ref 38–126)
Anion gap: 8 (ref 5–15)
BUN: 22 mg/dL (ref 8–23)
CO2: 23 mmol/L (ref 22–32)
Calcium: 8.9 mg/dL (ref 8.9–10.3)
Chloride: 109 mmol/L (ref 98–111)
Creatinine: 1.13 mg/dL (ref 0.61–1.24)
GFR, Estimated: >60 mL/min (ref >60)
Glucose, Bld: 123 mg/dL — ABNORMAL HIGH (ref 70–99)
Potassium: 3.8 mmol/L (ref 3.5–5.1)
Sodium: 140 mmol/L (ref 135–145)
Total Bilirubin: 0.9 mg/dL (ref 0.0–1.2)
Total Protein: 6.7 g/dL (ref 6.5–8.1)

## 2023-11-15 MED ORDER — HYDROXYUREA 500 MG PO CAPS: 500.0000 mg | ORAL_CAPSULE | ORAL | 1 refills | Status: DC

## 2023-11-15 NOTE — Progress Notes (Signed)
 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 He tolerates hydroxyurea well. Platelet is wnl, in 200,000s  Adjust current regimen to Hydroxyurea 500mg  daily for 6 days per week.     Iron deficiency anemia Lab Results  Component Value Date   HGB 14.5 11/15/2023   TIBC 253 08/07/2023   IRONPCTSAT 29 08/07/2023   FERRITIN 60 08/07/2023  Stable iron store.  In the context of his CKD, I recommend patient to continue Vitron C 2-3 times per week.  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 Also on Plavix.    CKD (chronic kidney disease), stage III (HCC) Encourage oral hydration and avoid nephrotoxins.     Orders Placed This Encounter  Procedures   CBC with Differential (Cancer Center Only)    Standing Status:   Future    Expected Date:   02/12/2024    Expiration Date:   11/14/2024   CMP (Cancer Center only)    Standing Status:   Future    Expected Date:   02/12/2024    Expiration Date:   11/14/2024   Iron and TIBC    Standing Status:   Future    Expected Date:   02/12/2024    Expiration Date:   11/14/2024   Ferritin    Standing Status:   Future    Expected Date:   02/12/2024    Expiration Date:   11/14/2024   Follow up  3 months lab MD  All questions were answered. The patient knows to call the clinic with any problems, questions or concerns.  Phillip Patience, MD, PhD Phillip [name] 11/15/2023      Pertinent hematology [name] history   Patient has significant history of previous DVT and PE.   Patient was seen  by me in March 2020 and declined further follow-up. 2013 history of prostate cancer treated with cryo therapy  01/03/2016 history of superficial venous thrombosis demonstrated in the greater saphenous vein-.  Also history of superficial thrombophlebitis of segment  of left the basal leg vein in the upper arm-08/24/2017.  No anticoagulation was recommended at that time due to the SVT features. 11/25/2018, right lower extremity acute DVT, started on Lovenox for about a week, 12/05/2018, patient underwent TURBT and intravesical instillation of gemcitabine for noninvasive low-grade papillary urothelial carcinoma.  Lovenox was held for 2 days prior and for 24 hours after procedure, and also started on Eliquis 12/15/2018, developed acute nonocclusive segmental and subsegmental pulmonary emboli within the right upper lobe, right middle lobe, right lower lobe pulmonary branches.  Patient was admitted to the hospital and started on heparin drip.  Patient was seen by me during his admission at that time.  Lovenox 1 mg/kg twice daily was recommended at discharge.  He declined further follow-up with me in the clinic.   His anticoagulation was managed by primary care provider Dr. Hyacinth Meeker.  Patient was on Coumadin for anticoagulation until Feb 2023.  Patient did not check INR since September 2022.  Patient also developed supratherapeutic INR due to interaction between Coumadin and Diflucan for treatment of esophageal candidiasis.  Patient was off Coumadin on 11/17/2021.  11/21/2021 developed acute nonocclusive DVT, started on Lovenox decision was made to switch to Eliquis.   03/09/2022, artificial urinary sphincter placement.  His Eliquis was held for 1 week prior to the procedure.  He has noticed left lower extremity pain 1 to 2 days after holding Eliquis. 03/11/2022, ER visit due to progressively worsening left lower extremity pain. CT angio aortobifemoral with and without contrast showed eccentric thrombus in the left outflow  vessels with distal occlusion of left renal vessels.  Delayed filling of the right popliteal artery is concerning for underlying occlusions and thromboembolic disease in the right runoff vessels Started on heparin drip in ED. 03/11/2022, vascular surgeon lower extremity bypass surgery 03/12/2022, severe left lower extremity pain, return to the OR for left lower extremity thrombectomy.  Continued on heparin drip. 03/16/2022, left lower extremity extremity nonsalvageable.  Status post BKA.  Per my discussion with pharmacy, heparin was held around 9 AM on 03/16/2022 and resumed on 03/17/2022 9:30 PM. . 03/17/2022, acute respiratory failure, CT chest angiogram showed bilateral central pulm emboli involving the main pulmonary arteries and extending into all lobar branches with nearly occlusive clot to the right lower lobe, left upper lobe, several segmental branches.  CT evidence of right heart strain.Mildly increased sclerotic focus    03/18/2022, thrombolysis, mechanical thrombectomy.  Heparin gtt restarted after procedure. 03/19/2022, heparin gtt was continued. 03/19/2022, bilateral lower extremity ultrasound showed thrombosis of right femoral popliteal and calf veins.  Partially thrombosed left profunda femoris vein. 03/19/2022 IVC filter placement. 03/20/2022, still have significant hypoxia.  On heparin gtt. 03/21/2022 -03/22/2022, on  heparin gtt. 03/23/2022, CT angio chest.showed increased clot burden within right pulmonary artery and the right lower lobe pulmonary emboli with similar clot burden in the right middle lobe, right upper lobe, left lower lobe.  Single residual fibrin strand/clot at the bifurcation of the pulmonary artery with decreased clot burden in the left upper lobe artery branches.  Persistent right heart strain.  Findings suspicious for developing pulmonary infarcts. 03/23/2022, mechanical embolectomy of pulmonary arteries as well as right external iliac vein, common iliac vein and inferior vena cava.    Given that patient developed recurrent PE/DVT despite being therapeutic on heparin GTT, post thrombectomy, patient was switched to Angiomax and later started on Argatroban drip  Patient had  hypercoagulable work-up done during the admission. Work-up showed negative factor V Leiden mutation, prothrombin gene imaging.  Negative anticardiolipin antibodies, lupus anticoagulants.  Negative beta-2 glycoprotein antibodies Positive JAK2 V6 78F mutation Patient was discharged on Eliquis.     06/01/22 Bone marrow biopsy showed  BONE MARROW, ASPIRATE, CLOT, CORE:  -Hypercellular marrow (60%) involved by a JAK2 mutated myeloproliferative neoplasm (see comment) Comment: The overall findings are consistent with a myeloproliferative neoplasm. Diagnostic considerations include essential thrombocythemia, polycythemia vera and pre- fibrotic primary myelofibrosis. Importantly, there is no overt increase in blasts or fibrosis.  However, correlation with erythropoietin levels, iron studies, presence/absence of splenomegaly and serum LDH along with cytogenetic studies would be  helpful for a more definitive assessment.   PERIPHERAL BLOOD:   -Thrombocytosis  - Mild leukocytosis  - Normocytic normochromic anemia   Cytogenetics normal  Status post IVC filter removal.   INTERVAL HISTORY Phillip [name] is a 78 y.o. male who has above history reviewed by me today presents for follow up visit for  recurrent thrombosis, Jak2 V678F mutation myeloproliferative disease  Patient tolerates Hydroxyurea 500 mg daily, he has not decreased hydroxyurea to 6 days/week.Marland Kitchen  He wears left lower extremity prosthetics.   Patient is on anticoagulation with Eliquis and Plavix.  Tolerates well.  No bleeding events.  MEDICAL HISTORY:  Past Medical History:  Diagnosis Date   Actinic keratosis    Arthritis    Basal cell carcinoma    L clavicle, L prox forearm- removed years ago    Bladder cancer (HCC)    Bladder neck  contracture    CAD (coronary artery disease)    a. 12/2020 NSTEMI/Cath: LM nl, LAD 95ost/p, LCX 50p, RCA nl. EF 45-50%.   Cataract    CKD (chronic kidney disease), stage III [facility])    ED (erectile dysfunction)    Frequency    GERD (gastroesophageal reflux disease)    History of pulmonary embolus (PE) 11/2018   a. Following LE DVT-->Chronic warfarin.   Hyperlipidemia LDL goal <70    Hypertension    Hypothyroidism    Incontinence of urine    sui, s/p cryoablation   Ischemic cardiomyopathy    a. 11/2018 Echo: EF 60-65%; b. 12/2020 LV Gram: EF 45-50% in setting of NSTEMI.   Kidney stones    Neuropathy    feet   Nocturia    Prostate cancer (HCC)    S/P   CRYOABLATION   Right elbow tendinitis    Right Lower Extremity DVT (deep venous thrombosis) (HCC) 11/25/2018   Vertigo    1-2x/yr   Wears glasses     SURGICAL HISTORY: Past Surgical History:  Procedure Laterality Date   AMPUTATION Left 03/16/2022   Procedure: AMPUTATION BELOW KNEE;  Surgeon: Renford Dills, MD;  Location: [facility] OR[facility];  Service: Vascular;  Laterality: Left;   ARTHROPLASTY AND TENDON REPAIR LEFT THUMB  JAN 2014   CATARACT EXTRACTION W/PHACO Left 11/16/2020   Procedure: CATARACT EXTRACTION PHACO AND INTRAOCULAR LENS PLACEMENT (IOC) LEFT  7.09 00:56.4 12.6%;  Surgeon: Lockie Mola, MD;  Location[facility];  Service: Ophthalmology;  Laterality: Left;   CATARACT EXTRACTION W/PHACO Right 12/02/2020   Procedure: CATARACT EXTRACTION PHACO AND INTRAOCULAR LENS PLACEMENT (IOC) RIGHT MALYUGIN 5.52 00:49.7 11.1%;  Surgeon: Lockie Mola, MD;  Location: [facility];  Service: Ophthalmology;  Laterality: Right;   COLONOSCOPY     COLONOSCOPY WITH PROPOFOL N/A 08/09/2017   Procedure: COLONOSCOPY WITH PROPOFOL;  Surgeon: Scot Jun, MD;  Location: [facility] ENDOSCOPY;  Service: Endoscopy;  Laterality: N/A;   CORONARY ANGIOGRAPHY N/A 12/28/2020   Procedure: CORONARY ANGIOGRAPHY;  Surgeon: Tonny Bollman, MD;  Location: [facility] INVASIVE CV LAB;  Service: Cardiovascular;  Laterality: N/A;   CORONARY STENT INTERVENTION N/A 12/28/2020   Procedure: CORONARY STENT INTERVENTION;  Surgeon: Tonny Bollman, MD;  Location: [facility] INVASIVE CV LAB;  Service: Cardiovascular;  Laterality: N/A;   ESOPHAGOGASTRODUODENOSCOPY (EGD) WITH PROPOFOL N/A 08/09/2017   Procedure: ESOPHAGOGASTRODUODENOSCOPY (EGD) WITH PROPOFOL;  Surgeon: Scot Jun, MD;  Location: [facility] ENDOSCOPY;  Service: Endoscopy;  Laterality: N/A;   GREEN LIGHT LASER TURP (TRANSURETHRAL RESECTION OF PROSTATE N/A 12/14/2015   Procedure: GREEN LIGHT LASER TURP (TRANSURETHRAL RESECTION OF PROSTATE) LASER OF BLADDER NECK CONTRACTURE;  Surgeon: Jethro Bolus, MD;  Location: [facility];  Service: Urology;  Laterality: N/A;   IVC FILTER INSERTION N/A 03/19/2022   Procedure: IVC FILTER INSERTION;  Surgeon: Nada Libman, MD;  Location: [facility] INVASIVE CV LAB;  Service: Cardiovascular;  Laterality: N/A;   IVC FILTER REMOVAL N/A 11/01/2022   Procedure: IVC FILTER REMOVAL;  Surgeon: Renford Dills, MD;  Location: [facility] INVASIVE CV LAB;  Service:  Cardiovascular;  Laterality: N/A;   LEFT HEART CATH AND CORONARY ANGIOGRAPHY N/A 12/24/2020   Procedure: LEFT HEART CATH AND CORONARY ANGIOGRAPHY and possible PCI and stent;  Surgeon: Alwyn Pea, MD;  Location: [facility] INVASIVE CV LAB;  Service: Cardiovascular;  Laterality: N/A;   LEFT HEART CATH AND CORONARY ANGIOGRAPHY N/A 01/06/2021   Procedure: LEFT HEART CATH AND CORONARY ANGIOGRAPHY;  Surgeon: Lennette Bihari, MD;  Location: [facility] INVASIVE CV LAB;  Service: Cardiovascular;  Laterality: N/A;   LOWER EXTREMITY ANGIOGRAPHY Left 03/11/2022   Procedure: Lower Extremity Angiography;  Surgeon: Renford Dills, MD;  Location: [facility] INVASIVE CV LAB;  Service: Cardiovascular;  Laterality: Left;   PENILE PROSTHESIS IMPLANT N/A 12/06/2013   Procedure: PENILE PROTHESIS INFLATABLE;  Surgeon: Kathi Ludwig, MD;  Location: [facility];  Service: Urology;  Laterality: N/A;   PROSTATE CRYOABLATION  06-01-2012  [facility]   PULMONARY THROMBECTOMY N/A 03/18/2022   Procedure: PULMONARY THROMBECTOMY;  Surgeon: Annice Needy, MD;  Location: [facility] INVASIVE CV LAB;  Service: Cardiovascular;  Laterality: N/A;   PULMONARY THROMBECTOMY Bilateral 03/23/2022   Procedure: PULMONARY THROMBECTOMY;  Surgeon: Renford Dills, MD;  Location: [facility] INVASIVE CV LAB;  Service: Cardiovascular;  Laterality: Bilateral;   THROMBECTOMY ILIAC ARTERY Left 03/12/2022   Procedure: THROMBECTOMY LEG;  Surgeon: Fransisco Hertz, MD;  Location: [facility];  Service: Vascular;  Laterality: Left;   TRANSURETHRAL RESECTION OF BLADDER NECK N/A 03/20/2015   Procedure: RELEASE BLADDER NECK CONTRACTURE  WITH WOLF BUTTON ELECTRODE ;  Surgeon: Jethro Bolus, MD;  Location: [facility];  Service: Urology;  Laterality: N/A;   TRANSURETHRAL RESECTION OF BLADDER TUMOR N/A 12/05/2018   Procedure: TRANSURETHRAL RESECTION OF BLADDER TUMOR (TURBT)/CYSTOSCOPY/  INSTILLATION OF Michelle Piper;  Surgeon: Rene Paci, MD;  Location: [facility];  Service: Urology;  Laterality: N/A;   TRANSURETHRAL RESECTION OF BLADDER TUMOR N/A 01/15/2020   Procedure: TRANSURETHRAL RESECTION OF BLADDER TUMOR (TURBT)/ CYSTOSCOPY;  Surgeon: Rene Paci, MD;  Location: [facility];  Service: Urology;  Laterality: N/A;   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      SOCIAL HISTORY: Social History   Socioeconomic History   Marital status: Married    Spouse name: Vail Vuncannon   Number of children: 3   Years of education: Not on file   Highest education level: Not on file  Occupational History   Not on file  Tobacco Use   Smoking status: Never    Passive exposure: Never   Smokeless tobacco: Never  Vaping Use   Vaping status: Never Used  Substance and Sexual  Activity   Alcohol use: No   Drug use: No   Sexual activity: Not Currently    Birth control/protection: None  Other Topics Concern   Not on file  Social History Narrative   Lives locally with wife.  Exercises regularly - gym/golf.  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   3 sons: 1 is a Engineer, civil (consulting).    Social Drivers of Corporate investment banker Strain: Low Risk  (06/16/2023)   Received from [facility]   Overall Financial Resource Strain (CARDIA)    Difficulty of Paying Living Expenses: Not hard at all  Food Insecurity: No Food Insecurity (06/16/2023)   [facility]   Hunger Vital Sign    Worried About Running Out of Food in the Last Year:  Never true    Ran Out of Food in the Last Year: Never true  Transportation Needs: No Transportation Needs (06/16/2023)   Received from [facility] - Transportation    In the past 12 months, has lack of transportation kept you from medical appointments or from getting medications?: No    Lack of Transportation (Non-Medical): No  Physical Activity: Not on file  Stress: Not on file  Social Connections: Not on file  Intimate Partner Violence: Not on file    FAMILY HISTORY: Family History  Problem Relation Age of Onset   CAD Father 48    ALLERGIES:  is allergic to doxazosin.  MEDICATIONS:  Current Outpatient Medications  Medication Sig Dispense Refill   acetaminophen (TYLENOL) 500 MG tablet Take 500 mg by mouth every 6 (six) hours as needed.     Alpha-Lipoic Acid 600 MG TABS Take 1 tablet by mouth daily.     apixaban (ELIQUIS) 5 MG TABS tablet Take 1 tablet (5 mg total) by mouth 2 (two) times daily. 60 tablet 0   cetirizine (ZYRTEC) 10 MG tablet Take 10 mg by mouth daily.     clopidogrel (PLAVIX) 75 MG tablet Take 1 tablet (75 mg total) by mouth daily with breakfast. 90 tablet 3   co-enzyme Q-10 30 MG capsule Take 30 mg by mouth daily.      glipiZIDE (GLUCOTROL XL) 2.5 MG 24 hr tablet Take by mouth.     Iron-Vitamin C (VITRON-C) 65-125 MG TABS Take 1 tablet by mouth daily. 90 tablet 1   levothyroxine (SYNTHROID) 50 MCG tablet Take 50 mcg by mouth.     metoprolol tartrate (LOPRESSOR) 25 MG tablet Take 0.5 tablets (12.5 mg total) by mouth 2 (two) times daily. 60 tablet 0   Multiple Vitamin (MULTIVITAMIN) tablet Take 1 tablet by mouth daily.     nitroGLYCERIN (NITROSTAT) 0.4 MG SL tablet Place 1 tablet (0.4 mg total) under the tongue every 5 (five) minutes x 3 doses as needed for chest pain. 25 tablet 3   pantoprazole (PROTONIX) 40 MG tablet Take 1 tablet (40 mg total) by mouth daily. 90 tablet 3   pravastatin (PRAVACHOL) 40 MG tablet Take 40 mg by mouth daily.     TRUE METRIX BLOOD GLUCOSE TEST test strip SMARTSIG:Via Meter     TRUEplus Lancets 33G MISC      Turmeric (QC TUMERIC COMPLEX PO) Take by mouth.     ascorbic acid (VITAMIN C) 500 MG tablet Take 500 mg by mouth daily. (Patient not taking: Reported on 05/08/2023)     hydroxyurea (HYDREA) 500 MG capsule Take 1 capsule (500 mg total) by mouth See admin instructions. May take with food to minimize GI side effects.Take 500mg  daily 6 days per week. 90 capsule 1   testosterone cypionate (DEPOTESTOSTERONE CYPIONATE) 200 MG/ML injection Inject into the muscle. (Patient not taking: Reported on 05/08/2023)     No current facility-administered medications for this visit.    Review of Systems  Constitutional:  Negative for appetite change, chills, fatigue, fever and unexpected weight change.  HENT:   Negative for hearing loss and voice change.   Eyes:  Negative for eye problems and icterus.  Respiratory:  Negative for chest tightness, cough and shortness of breath.   Cardiovascular:  Negative for chest pain and leg swelling.  Gastrointestinal:  Negative for abdominal distention and abdominal pain.  Endocrine: Negative for hot flashes.  Genitourinary:  Negative for difficulty  urinating, dysuria and frequency.  Musculoskeletal:  Negative for arthralgias.       Left BKA  Skin:  Negative for itching and rash.  Neurological:  Negative for light-headedness and numbness.  Hematological:  Negative for adenopathy. Does not bruise/bleed easily.  Psychiatric/Behavioral:  Negative for confusion.    PHYSICAL EXAMINATION:  Vitals:   11/15/23 1000  BP: 138/75  Pulse: 67  Resp: 18  Temp: (!) 96 F (35.6 C)  SpO2: 100%   Filed Weights   11/15/23 1000  Weight: 174 lb 1.6 oz (79 kg)    Physical Exam Constitutional:      General: He is not in acute distress. HENT:     Head: Normocephalic and atraumatic.  Eyes:     General: No scleral icterus. Cardiovascular:     Rate and Rhythm: Normal rate.  Pulmonary:     Effort: Pulmonary effort is normal. No respiratory distress.     Breath sounds: No wheezing.  Abdominal:     General: Bowel sounds are normal. There is no distension.     Palpations: Abdomen is soft.  Musculoskeletal:        General: Normal range of motion.     Cervical back: Normal range of motion and neck supple.     Comments: Status post left BKA, + left lower limb prosthetics.   Skin:    General: Skin is warm and dry.     Findings: No erythema or rash.  Neurological:     Mental Status: He is alert and oriented to person, place, and time. Mental status is at baseline.     Cranial Nerves: No cranial nerve deficit.     Coordination: Coordination normal.  Psychiatric:        Mood and Affect: Mood normal.     LABORATORY DATA:  I have reviewed the data as listed    Latest Ref Rng & Units 11/15/2023    9:48 AM 08/07/2023    2:05 PM 05/08/2023    2:43 PM  CBC  WBC 4.0 - 10.5 K/uL 6.9  9.3  8.6   Hemoglobin 13.0 - 17.0 g/dL 16.1  09.6  04.5   Hematocrit 39.0 - 52.0 % 44.5  44.0  45.2   Platelets 150 - 400 K/uL 287  273  313       Latest Ref Rng & Units 11/15/2023    9:48 AM 08/07/2023    2:05 PM 05/08/2023    2:43 PM  CMP  Glucose 70 - 99  mg/dL 409  811  914   BUN 8 - 23 mg/dL 22  14  21    Creatinine 0.61 - 1.24 mg/dL 7.82  9.56  2.13   Sodium 135 - 145 mmol/L 140  141  137   Potassium 3.5 - 5.1 mmol/L 3.8  3.6  3.9   Chloride 98 - 111 mmol/L 109  106  107   CO2 22 - 32 mmol/L 23  27  25    Calcium 8.9 - 10.3 mg/dL 8.9  8.4  8.4   Total Protein 6.5 - 8.1 g/dL 6.7  6.3  6.0   Total Bilirubin 0.0 - 1.2 mg/dL 0.9  0.6  0.7   Alkaline Phos 38 - 126 U/L 47  48  42   AST 15 - 41 U/L 23  22  22    ALT 0 - 44 U/L 22  23  32       RADIOGRAPHIC STUDIES: I have personally reviewed the radiological images as listed and agreed with the findings  in the report. VAS [place] ABI WITH/WO TBI Result Date: 10/03/2023  LOWER EXTREMITY DOPPLER STUDY Patient Name:  Phillip [name]  Date of Exam:   10/02/2023 Medical Rec #: [id number]         Accession #:    [id number] Date of Birth: [birth year]         Patient Gender: M Patient Age:   54 years Exam Location:  [facility] Procedure:      VAS [place] ABI WITH/WO TBI Referring Phys: --------------------------------------------------------------------------------  High Risk Factors: Hypertension, hyperlipidemia, prior MI.  Vascular Interventions: Left below knee amputation 03/16/2022. Comparison Study: 05/2022 Performing Technologist: Salvadore Farber RVT  Examination Guidelines: A complete evaluation includes at minimum, Doppler waveform signals and systolic blood pressure reading at the level of bilateral brachial, anterior tibial, and posterior tibial arteries, when vessel segments are accessible. Bilateral testing is considered an integral part of a complete examination. Photoelectric Plethysmograph (PPG) waveforms and toe systolic pressure readings are included as required and additional duplex testing as needed. Limited examinations for reoccurring indications may be performed as noted.  ABI Findings: +---------+------------------+-----+---------+--------+ Right    Rt Pressure (mmHg)IndexWaveform  Comment  +---------+------------------+-----+---------+--------+ Brachial 157                                      +---------+------------------+-----+---------+--------+ ATA      159               1.01 triphasic         +---------+------------------+-----+---------+--------+ PTA      182               1.16 triphasic         +---------+------------------+-----+---------+--------+ Great Toe151               0.96 Normal            +---------+------------------+-----+---------+--------+ +--------+------------------+-----+--------+-------+ Left    Lt Pressure (mmHg)IndexWaveformComment +--------+------------------+-----+--------+-------+ [id number]                                    +--------+------------------+-----+--------+-------+ +-------+-----------+-----------+------------+------------+ ABI/TBIToday's ABIToday's TBIPrevious ABIPrevious TBI +-------+-----------+-----------+------------+------------+ Right  1.16       .96        1.28        .87          +-------+-----------+-----------+------------+------------+ Left   BKA                   BKA                      +-------+-----------+-----------+------------+------------+  Summary: Right: Resting right ankle-brachial index is within normal range. The right toe-brachial index is normal. *See table(s) above for measurements and observations.  Electronically signed by Festus Barren MD on 10/03/2023 at 1:32:33 PM.    Final

## 2023-11-15 NOTE — Assessment & Plan Note (Addendum)
 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 He tolerates hydroxyurea well. Platelet is wnl, in 200,000s  Adjust current regimen to Hydroxyurea 500mg  daily for 6 days per week.

## 2023-11-15 NOTE — Assessment & Plan Note (Signed)
Recurrent DVT and embolism, s/p IVC filter, s/p IVC filter removal  Continue long term anticoagulation with Eliquis 5mg  BID [ patient gets refills through PCP's office]. Also on Plavix.

## 2023-11-15 NOTE — Assessment & Plan Note (Signed)
 Lab Results  Component Value Date   HGB 14.5 11/15/2023   TIBC 253 08/07/2023   IRONPCTSAT 29 08/07/2023   FERRITIN 60 08/07/2023  Stable iron store.  In the context of his CKD, I recommend patient to continue Vitron C 2-3 times per week.

## 2023-11-15 NOTE — Assessment & Plan Note (Signed)
 Encourage oral hydration and avoid nephrotoxins.

## 2023-12-06 ENCOUNTER — Other Ambulatory Visit (INDEPENDENT_AMBULATORY_CARE_PROVIDER_SITE_OTHER): Payer: Self-pay | Admitting: Nurse Practitioner

## 2023-12-08 DIAGNOSIS — N1831 Chronic kidney disease, stage 3a: Secondary | ICD-10-CM | POA: Diagnosis not present

## 2023-12-08 DIAGNOSIS — R350 Frequency of micturition: Secondary | ICD-10-CM | POA: Diagnosis not present

## 2023-12-08 DIAGNOSIS — R399 Unspecified symptoms and signs involving the genitourinary system: Secondary | ICD-10-CM | POA: Diagnosis not present

## 2023-12-11 ENCOUNTER — Telehealth: Payer: Self-pay | Admitting: Urology

## 2023-12-11 NOTE — Telephone Encounter (Signed)
 Patient called today and requested to schedule an appointment with Dr. Apolinar Junes. He states he is lacking sensitivity in penile area due to a cryoablation he had done years ago. He states it is causing issues between he and his wife. He also has penile implant. He would like to speak with Dr. Apolinar Junes to get a male perspective. He does not want to transfer his care; he still wants to see Dr. Richardo Hanks, but would like to speak with Dr. Apolinar Junes about this issue. Is it ok to schedule appointment? Please advise.   No, this is not appropriate.  I have nothing to add here.   Vanna Scotland, MD

## 2023-12-12 DIAGNOSIS — M6281 Muscle weakness (generalized): Secondary | ICD-10-CM | POA: Diagnosis not present

## 2023-12-12 DIAGNOSIS — Z89512 Acquired absence of left leg below knee: Secondary | ICD-10-CM | POA: Diagnosis not present

## 2023-12-20 DIAGNOSIS — D6859 Other primary thrombophilia: Secondary | ICD-10-CM | POA: Diagnosis not present

## 2023-12-20 DIAGNOSIS — R739 Hyperglycemia, unspecified: Secondary | ICD-10-CM | POA: Diagnosis not present

## 2023-12-20 DIAGNOSIS — E039 Hypothyroidism, unspecified: Secondary | ICD-10-CM | POA: Diagnosis not present

## 2023-12-20 DIAGNOSIS — C61 Malignant neoplasm of prostate: Secondary | ICD-10-CM | POA: Diagnosis not present

## 2023-12-21 NOTE — Progress Notes (Signed)
 12/22/2023 8:43 AM   Phillip [name] 1945/11/25 [id number]  Referring provider: Danella Penton, MD [id number] [facility] [address] [facility] [place],  [place] [address]  Urological history: 1.  Prostate cancer -PSA (05/2023) 0.02 -Treated with cryoablation  2. ED -Continue factors of age, prostate cancer, -IPP  3. 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 Bladder neck contracture -cysto (01/2023) NED   5. SUI -AUS placed (2023)   6.  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 male who presents today for lacking sensitivity in the penile area.    Previous records reviewed.   He states that this lack of sensitivity has been occurring since he had his cryoablation for his prostate cancer in 2013.  He wishes he had never had the cryoablation.    He had been on PDE5i's for a short time and ICI for a longer time and then ultimately had an IPP.  He was also on TRT, but that had to be discontinued secondary to his DVT's and PE's.    Interesting, his AUS is working great even though he lost his leg after having to stop his anticoagulant for the surgery.    When he was on TRT,  he was able to reach climax.  Now, his wife is experiencing pain because it is taking him longer to reach climax.  His wife is not with him today, but he states she still wants to have vaginal intercourse.    PMH: Past Medical History:  Diagnosis Date   Actinic keratosis    Arthritis    Basal cell carcinoma    L clavicle, L prox forearm- removed years ago    Bladder cancer (HCC)    Bladder neck contracture    CAD (coronary artery disease)    a. 12/2020 NSTEMI/Cath: LM nl, LAD 95ost/p, LCX 50p, RCA nl. EF 45-50%.   Cataract    CKD (chronic kidney disease), stage III [facility])    ED (erectile dysfunction)    Frequency    GERD  (gastroesophageal reflux disease)    History of pulmonary embolus (PE) 11/2018   a. Following LE DVT-->Chronic warfarin.   Hyperlipidemia LDL goal <70    Hypertension    Hypothyroidism    Incontinence of urine    sui, s/p cryoablation   Ischemic cardiomyopathy    a. 11/2018 Echo: EF 60-65%; b. 12/2020 LV Gram: EF 45-50% in setting of NSTEMI.   Kidney stones    Neuropathy    feet   Nocturia    Prostate cancer (HCC)    S/P   CRYOABLATION   Right elbow tendinitis    Right Lower Extremity DVT (deep venous thrombosis) (HCC) 11/25/2018   Vertigo    1-2x/yr   Wears glasses     Surgical History: Past Surgical History:  Procedure Laterality Date   AMPUTATION Left 03/16/2022   Procedure: AMPUTATION BELOW KNEE;  Surgeon: Renford Dills, MD;  Location: [facility] ORS;  Service: Vascular;  Laterality: Left;   ARTHROPLASTY AND TENDON REPAIR LEFT THUMB  JAN 2014   CATARACT EXTRACTION W/PHACO Left 11/16/2020   Procedure: CATARACT EXTRACTION PHACO AND INTRAOCULAR LENS PLACEMENT (IOC) LEFT  7.09 00:56.4 12.6%;  Surgeon: Lockie Mola, MD;  Location: [facility];  Service: Ophthalmology;  Laterality: Left;   CATARACT EXTRACTION W/PHACO Right 12/02/2020   Procedure: CATARACT  EXTRACTION PHACO AND INTRAOCULAR LENS PLACEMENT (IOC) RIGHT MALYUGIN 5.52 00:49.7 11.1%;  Surgeon: Lockie Mola, MD;  Location: [facility];  Service: Ophthalmology;  Laterality: Right;   COLONOSCOPY     COLONOSCOPY WITH PROPOFOL N/A 08/09/2017   Procedure: COLONOSCOPY WITH PROPOFOL;  Surgeon: Scot Jun, MD;  Location: [facility] ENDOSCOPY;  Service: Endoscopy;  Laterality: N/A;   CORONARY ANGIOGRAPHY N/A 12/28/2020   Procedure: CORONARY ANGIOGRAPHY;  Surgeon: Tonny Bollman, MD;  Location: [facility] INVASIVE CV LAB;  Service: Cardiovascular;  Laterality: N/A;   CORONARY STENT INTERVENTION N/A 12/28/2020   Procedure: CORONARY STENT INTERVENTION;  Surgeon: Tonny Bollman, MD;  Location: [facility] INVASIVE CV LAB;   Service: Cardiovascular;  Laterality: N/A;   ESOPHAGOGASTRODUODENOSCOPY (EGD) WITH PROPOFOL N/A 08/09/2017   Procedure: ESOPHAGOGASTRODUODENOSCOPY (EGD) WITH PROPOFOL;  Surgeon: Scot Jun, MD;  Location: [facility] ENDOSCOPY;  Service: Endoscopy;  Laterality: N/A;   GREEN LIGHT LASER TURP (TRANSURETHRAL RESECTION OF PROSTATE N/A 12/14/2015   Procedure: GREEN LIGHT LASER TURP (TRANSURETHRAL RESECTION OF PROSTATE) LASER OF BLADDER NECK CONTRACTURE;  Surgeon: Jethro Bolus, MD;  Location: [facility];  Service: Urology;  Laterality: N/A;   IVC FILTER INSERTION N/A 03/19/2022   Procedure: IVC FILTER INSERTION;  Surgeon: Nada Libman, MD;  Location: [facility] INVASIVE CV LAB;  Service: Cardiovascular;  Laterality: N/A;   IVC FILTER REMOVAL N/A 11/01/2022   Procedure: IVC FILTER REMOVAL;  Surgeon: Renford Dills, MD;  Location: [facility] INVASIVE CV LAB;  Service: Cardiovascular;  Laterality: N/A;   LEFT HEART CATH AND CORONARY ANGIOGRAPHY N/A 12/24/2020   Procedure: LEFT HEART CATH AND CORONARY ANGIOGRAPHY and possible PCI and stent;  Surgeon: Alwyn Pea, MD;  Location: [facility] INVASIVE CV LAB;  Service: Cardiovascular;  Laterality: N/A;   LEFT HEART CATH AND CORONARY ANGIOGRAPHY N/A 01/06/2021   Procedure: LEFT HEART CATH AND CORONARY ANGIOGRAPHY;  Surgeon: Lennette Bihari, MD;  Location: [facility] INVASIVE CV LAB;  Service: Cardiovascular;  Laterality: N/A;   LOWER EXTREMITY ANGIOGRAPHY Left 03/11/2022   Procedure: Lower Extremity Angiography;  Surgeon: Renford Dills, MD;  Location: [facility] INVASIVE CV LAB;  Service: Cardiovascular;  Laterality: Left;   PENILE PROSTHESIS IMPLANT N/A 12/06/2013   Procedure: PENILE PROTHESIS INFLATABLE;  Surgeon: Kathi Ludwig, MD;  Location: [facility];  Service: Urology;  Laterality: N/A;   PROSTATE CRYOABLATION  06-01-2012  [facility]   PULMONARY THROMBECTOMY N/A 03/18/2022   Procedure: PULMONARY THROMBECTOMY;  Surgeon: Annice Needy, MD;   Location: [facility] INVASIVE CV LAB;  Service: Cardiovascular;  Laterality: N/A;   PULMONARY THROMBECTOMY Bilateral 03/23/2022   Procedure: PULMONARY THROMBECTOMY;  Surgeon: Renford Dills, MD;  Location: [facility] INVASIVE CV LAB;  Service: Cardiovascular;  Laterality: Bilateral;   THROMBECTOMY ILIAC ARTERY Left 03/12/2022   Procedure: THROMBECTOMY LEG;  Surgeon: Fransisco Hertz, MD;  Location: [facility];  Service: Vascular;  Laterality: Left;   TRANSURETHRAL RESECTION OF BLADDER NECK N/A 03/20/2015   Procedure: RELEASE BLADDER NECK CONTRACTURE  WITH WOLF BUTTON ELECTRODE ;  Surgeon: Jethro Bolus, MD;  Location: [facility] [place];  Service: Urology;  Laterality: N/A;   TRANSURETHRAL RESECTION OF BLADDER TUMOR N/A 12/05/2018   Procedure: TRANSURETHRAL RESECTION OF BLADDER TUMOR (TURBT)/CYSTOSCOPY/  INSTILLATION OF Michelle Piper;  Surgeon: Rene Paci, MD;  Location: [facility];  Service: Urology;  Laterality: N/A;   TRANSURETHRAL RESECTION OF BLADDER TUMOR N/A 01/15/2020   Procedure: TRANSURETHRAL RESECTION OF BLADDER TUMOR (TURBT)/ CYSTOSCOPY;  Surgeon: Rene Paci, MD;  Location: [facility];  Service: Urology;  Laterality: N/A;   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      Home Medications:  Allergies as of 12/22/2023       Reactions   Doxazosin Other (See Comments)   Other reaction(s): Unknown        Medication List        Accurate as of December 22, 2023 11:59 PM. If you have any questions, ask your nurse or doctor.          STOP taking these medications    testosterone cypionate 200 MG/ML injection Commonly known as: DEPOTESTOSTERONE CYPIONATE Stopped by: Carollee Herter Dashton Czerwinski       TAKE these medications    acetaminophen 500 MG tablet Commonly known as: TYLENOL Take 500 mg by mouth every 6 (six) hours as needed.   Alpha-Lipoic Acid 600 MG Tabs Take 1 tablet by mouth daily.   ascorbic acid  500 MG tablet Commonly known as: VITAMIN C Take 500 mg by mouth daily.   cetirizine 10 MG tablet Commonly known as: ZYRTEC Take 10 mg by mouth daily.   clopidogrel 75 MG tablet Commonly known as: PLAVIX Take 1 tablet (75 mg total) by mouth daily with breakfast.   co-enzyme Q-10 30 MG capsule Take 30 mg by mouth daily.   Eliquis 5 MG Tabs tablet Generic drug: apixaban TAKE ONE TABLET BY MOUTH TWICE DAILY   glipiZIDE 2.5 MG 24 hr tablet Commonly known as: GLUCOTROL XL Take by mouth.   hydroxyurea 500 MG capsule Commonly known as: HYDREA Take 1 capsule (500 mg total) by mouth See admin instructions. May take with food to minimize GI side effects.Take 500mg  daily 6 days per week.   levothyroxine 50 MCG tablet Commonly known as: SYNTHROID Take 50 mcg by mouth.   metoprolol tartrate 25 MG tablet Commonly known as: LOPRESSOR Take 0.5 tablets (12.5 mg total) by mouth 2 (two) times daily.   multivitamin tablet Take 1 tablet by mouth daily.   nitroGLYCERIN 0.4 MG SL tablet Commonly known as: NITROSTAT Place 1 tablet (0.4 mg total) under the tongue every 5 (five) minutes x 3 doses as needed for chest pain.   pantoprazole 40 MG tablet Commonly known as: PROTONIX Take 1 tablet (40 mg total) by mouth daily.   pravastatin 40 MG tablet Commonly known as: PRAVACHOL Take 40 mg by mouth daily.   QC TUMERIC COMPLEX PO Take by mouth.   tadalafil 20 MG tablet Commonly known as: CIALIS Take 1 tablet (20 mg total) by mouth daily as needed for erectile dysfunction. Started by: Michiel Cowboy   True Metrix Blood Glucose Test test strip Generic drug: glucose blood SMARTSIG:Via Meter   TRUEplus Lancets 33G Misc   Vitron-C 65-125 MG Tabs Generic drug: Iron-Vitamin C Take 1 tablet by mouth daily.        Allergies:  Allergies  Allergen Reactions   Doxazosin Other (See Comments)    Other reaction(s): Unknown    Family History: Family History  Problem Relation Age of  Onset   CAD Father 90    Social History:  reports that he has never smoked. He has never been exposed to tobacco smoke. He has never used smokeless tobacco. He reports that he does not drink alcohol and does not use drugs.  ROS: Pertinent ROS in HPI  Physical Exam: BP (!) 152/65   Pulse (!) 57   Ht 5\' 9"  (1.753 m)   Wt 165 lb  9 oz (75.1 kg)   BMI 24.45 kg/m   Constitutional:  Well nourished. Alert and oriented, No acute distress. HEENT: [place] AT, moist mucus membranes.  Trachea midline Cardiovascular: No clubbing, cyanosis, or edema. Respiratory: Normal respiratory effort, no increased work of breathing. Neurologic: Grossly intact, no focal deficits, moving all 4 extremities. Psychiatric: Normal mood and affect.  Laboratory Data: Lab Results  Component Value Date   WBC 6.9 11/15/2023   HGB 14.5 11/15/2023   HCT 44.5 11/15/2023   MCV 105.2 (H) 11/15/2023   PLT 287 11/15/2023    Lab Results  Component Value Date   CREATININE 1.13 11/15/2023   Lab Results  Component Value Date   AST 23 11/15/2023   Lab Results  Component Value Date   ALT 22 11/15/2023  I have reviewed the labs.   Pertinent Imaging: N/A  Assessment & Plan:    1. Penile insensitivity -I had a frank conversation with Mr. Routon stating that the sensitivity will likely not return.  It would be unsafe to restart TRT secondary to his DVT/PE history.  He is very focused on the fact he felt pressured into having the cryoablation for the prostate cancer and I explained that all treatments for prostate cancer has the risk of ED and also we need to expect a decrease in ED as we age as well.  We could try tadalafil 5 mg daily in an effort to conduct some sort of penile rehab, but I explained that this is an off label use and would likely not work.  He was agreeable to try taking the tadalafil 5 mg daily for three months.   -script sent in for tadalafil 5 mg daily and he was instructed to contact us if his  insurance will not cover the med, so we can direct him to another pharmacy to use GoodRx  2. Bladder cancer -Has surveillance cystoscopy with Dr. Richardo Hanks in May  3. Prostate cancer -yearly PSA's by PCP  No follow-ups on file.  These notes generated with voice recognition software. I apologize for typographical errors.  Cloretta Ned  [facility] [address]  [address] [place], [place] [address] [phone]  I spent 30 minutes on the day of the encounter to include pre-visit record review, face-to-face time with the patient, and post-visit ordering of tests.

## 2023-12-22 ENCOUNTER — Ambulatory Visit: Admitting: Urology

## 2023-12-22 VITALS — BP 152/65 | HR 57 | Ht 69.0 in | Wt 165.6 lb

## 2023-12-22 DIAGNOSIS — Z558 Other problems related to education and literacy: Secondary | ICD-10-CM

## 2023-12-22 DIAGNOSIS — C61 Malignant neoplasm of prostate: Secondary | ICD-10-CM | POA: Diagnosis not present

## 2023-12-22 DIAGNOSIS — C679 Malignant neoplasm of bladder, unspecified: Secondary | ICD-10-CM

## 2023-12-22 MED ORDER — TADALAFIL 20 MG PO TABS: 20.0000 mg | ORAL_TABLET | Freq: Every day | ORAL | 3 refills | Status: DC | PRN

## 2023-12-24 ENCOUNTER — Encounter: Payer: Self-pay | Admitting: Urology

## 2024-01-02 DIAGNOSIS — Z125 Encounter for screening for malignant neoplasm of prostate: Secondary | ICD-10-CM | POA: Diagnosis not present

## 2024-01-02 DIAGNOSIS — T451X5A Adverse effect of antineoplastic and immunosuppressive drugs, initial encounter: Secondary | ICD-10-CM | POA: Diagnosis not present

## 2024-01-02 DIAGNOSIS — G620 Drug-induced polyneuropathy: Secondary | ICD-10-CM | POA: Diagnosis not present

## 2024-01-02 DIAGNOSIS — E782 Mixed hyperlipidemia: Secondary | ICD-10-CM | POA: Diagnosis not present

## 2024-01-02 DIAGNOSIS — D473 Essential (hemorrhagic) thrombocythemia: Secondary | ICD-10-CM | POA: Diagnosis not present

## 2024-01-02 DIAGNOSIS — E1169 Type 2 diabetes mellitus with other specified complication: Secondary | ICD-10-CM | POA: Diagnosis not present

## 2024-01-12 DIAGNOSIS — M6281 Muscle weakness (generalized): Secondary | ICD-10-CM | POA: Diagnosis not present

## 2024-01-12 DIAGNOSIS — Z89512 Acquired absence of left leg below knee: Secondary | ICD-10-CM | POA: Diagnosis not present

## 2024-01-21 DIAGNOSIS — S40262A Insect bite (nonvenomous) of left shoulder, initial encounter: Secondary | ICD-10-CM | POA: Diagnosis not present

## 2024-01-21 DIAGNOSIS — L03114 Cellulitis of left upper limb: Secondary | ICD-10-CM | POA: Diagnosis not present

## 2024-01-21 DIAGNOSIS — W57XXXA Bitten or stung by nonvenomous insect and other nonvenomous arthropods, initial encounter: Secondary | ICD-10-CM | POA: Diagnosis not present

## 2024-01-24 DIAGNOSIS — E782 Mixed hyperlipidemia: Secondary | ICD-10-CM | POA: Diagnosis not present

## 2024-01-24 DIAGNOSIS — S0006XA Insect bite (nonvenomous) of scalp, initial encounter: Secondary | ICD-10-CM | POA: Diagnosis not present

## 2024-01-24 DIAGNOSIS — W57XXXA Bitten or stung by nonvenomous insect and other nonvenomous arthropods, initial encounter: Secondary | ICD-10-CM | POA: Diagnosis not present

## 2024-01-24 DIAGNOSIS — E1169 Type 2 diabetes mellitus with other specified complication: Secondary | ICD-10-CM | POA: Diagnosis not present

## 2024-02-01 ENCOUNTER — Ambulatory Visit: Payer: Self-pay | Admitting: Urology

## 2024-02-01 VITALS — BP 140/93 | HR 59 | Ht 69.0 in | Wt 175.0 lb

## 2024-02-01 DIAGNOSIS — Z8551 Personal history of malignant neoplasm of bladder: Secondary | ICD-10-CM | POA: Diagnosis not present

## 2024-02-01 DIAGNOSIS — C679 Malignant neoplasm of bladder, unspecified: Secondary | ICD-10-CM

## 2024-02-01 NOTE — Progress Notes (Signed)
 Cystoscopy Procedure Note:  Indication: Hx LG Ta urothelial cell carcinoma  78 year old very complex male with extensive prior urologic history, previously followed by [facility] as well as by Dr. Isla Mari and Dr. Sherrine Dolly in [place].  Cryoablation ~2013 for prostate cancer, this has been complicated by bladder neck contracture, incontinence, ED, and he currently has a penile prosthesis as well as an artificial urinary sphincter.  History of 1.5 cm papillary bladder tumor treated with TURBT and gemcitabine  by Dr. Sherrine Dolly in March 2020, no recurrences since that time.  He also had a benign lesion at the bladder neck resected by Dr. Sherrine Dolly in April 2021.  History of low testosterone , had massive PE in June 2023 requiring numerous procedures as well as lower extremity amputation, would not recommend any further testosterone  replacement.  From a prostate cancer perspective, PSA has remained very low, most recently undetectable in April 2025.  We reviewed guidelines that recommend considering discontinuing PSA monitoring when greater than 10 years out from definitive treatment, he would like to continue yearly PSA check.  After informed consent and discussion of the procedure and its risks, ROMMELL MCCUMBERS was positioned and prepped in the standard fashion.  He cycled his artificial sphincter to allow access to the bladder.  Cystoscopy was performed with a flexible cystoscope. The urethra, bladder neck and entire bladder was visualized in a standard fashion.  No evidence of erosion of artificial sphincter or penile prosthesis.  Wide open prostatic fossa from prior procedures.  The ureteral orifices were visualized in their normal location and orientation.  No suspicious lesions, mild trabeculations.  No abnormalities on retroflexion.  Findings: No evidence of recurrence  Assessment and Plan: Declines further surveillance cystoscopy per guideline recommendations, return precaution of gross hematuria  reviewed RTC 1 year symptom check with history of prostate cancer, IPP, AUS  Jay Meth, MD 02/01/2024

## 2024-02-06 ENCOUNTER — Encounter (INDEPENDENT_AMBULATORY_CARE_PROVIDER_SITE_OTHER): Payer: Self-pay

## 2024-02-11 DIAGNOSIS — M6281 Muscle weakness (generalized): Secondary | ICD-10-CM | POA: Diagnosis not present

## 2024-02-11 DIAGNOSIS — Z89512 Acquired absence of left leg below knee: Secondary | ICD-10-CM | POA: Diagnosis not present

## 2024-02-19 ENCOUNTER — Inpatient Hospital Stay: Payer: HMO | Attending: Oncology

## 2024-02-19 DIAGNOSIS — D473 Essential (hemorrhagic) thrombocythemia: Secondary | ICD-10-CM | POA: Insufficient documentation

## 2024-02-19 DIAGNOSIS — N183 Chronic kidney disease, stage 3 unspecified: Secondary | ICD-10-CM | POA: Insufficient documentation

## 2024-02-19 DIAGNOSIS — Z8551 Personal history of malignant neoplasm of bladder: Secondary | ICD-10-CM | POA: Insufficient documentation

## 2024-02-19 DIAGNOSIS — D509 Iron deficiency anemia, unspecified: Secondary | ICD-10-CM | POA: Insufficient documentation

## 2024-02-19 DIAGNOSIS — Z08 Encounter for follow-up examination after completed treatment for malignant neoplasm: Secondary | ICD-10-CM | POA: Insufficient documentation

## 2024-02-19 DIAGNOSIS — Z8546 Personal history of malignant neoplasm of prostate: Secondary | ICD-10-CM | POA: Insufficient documentation

## 2024-02-20 ENCOUNTER — Inpatient Hospital Stay

## 2024-02-20 DIAGNOSIS — Z8551 Personal history of malignant neoplasm of bladder: Secondary | ICD-10-CM | POA: Diagnosis not present

## 2024-02-20 DIAGNOSIS — Z08 Encounter for follow-up examination after completed treatment for malignant neoplasm: Secondary | ICD-10-CM | POA: Diagnosis not present

## 2024-02-20 DIAGNOSIS — D471 Chronic myeloproliferative disease: Secondary | ICD-10-CM

## 2024-02-20 DIAGNOSIS — D509 Iron deficiency anemia, unspecified: Secondary | ICD-10-CM | POA: Diagnosis not present

## 2024-02-20 DIAGNOSIS — D473 Essential (hemorrhagic) thrombocythemia: Secondary | ICD-10-CM | POA: Diagnosis not present

## 2024-02-20 DIAGNOSIS — N183 Chronic kidney disease, stage 3 unspecified: Secondary | ICD-10-CM | POA: Diagnosis not present

## 2024-02-20 DIAGNOSIS — Z8546 Personal history of malignant neoplasm of prostate: Secondary | ICD-10-CM | POA: Diagnosis not present

## 2024-02-20 LAB — CBC WITH DIFFERENTIAL (CANCER CENTER ONLY)
Abs Immature Granulocytes: 0.02 10*3/uL (ref 0.00–0.07)
Basophils Absolute: 0.1 10*3/uL (ref 0.0–0.1)
Basophils Relative: 1 %
Eosinophils Absolute: 0.2 10*3/uL (ref 0.0–0.5)
Eosinophils Relative: 3 %
HCT: 45.2 % (ref 39.0–52.0)
Hemoglobin: 14.7 g/dL (ref 13.0–17.0)
Immature Granulocytes: 0 %
Lymphocytes Relative: 21 %
Lymphs Abs: 1.7 10*3/uL (ref 0.7–4.0)
MCH: 33.2 pg (ref 26.0–34.0)
MCHC: 32.5 g/dL (ref 30.0–36.0)
MCV: 102 fL — ABNORMAL HIGH (ref 80.0–100.0)
Monocytes Absolute: 0.6 10*3/uL (ref 0.1–1.0)
Monocytes Relative: 8 %
Neutro Abs: 5.4 10*3/uL (ref 1.7–7.7)
Neutrophils Relative %: 67 %
Platelet Count: 291 10*3/uL (ref 150–400)
RBC: 4.43 MIL/uL (ref 4.22–5.81)
RDW: 13.3 % (ref 11.5–15.5)
WBC Count: 8 10*3/uL (ref 4.0–10.5)
nRBC: 0 % (ref 0.0–0.2)

## 2024-02-20 LAB — IRON AND TIBC
Iron: 54 ug/dL (ref 45–182)
Saturation Ratios: 18 % (ref 17.9–39.5)
TIBC: 294 ug/dL (ref 250–450)
UIBC: 240 ug/dL

## 2024-02-20 LAB — CMP (CANCER CENTER ONLY)
ALT: 21 U/L (ref 0–44)
AST: 22 U/L (ref 15–41)
Albumin: 3.9 g/dL (ref 3.5–5.0)
Alkaline Phosphatase: 55 U/L (ref 38–126)
Anion gap: 7 (ref 5–15)
BUN: 18 mg/dL (ref 8–23)
CO2: 24 mmol/L (ref 22–32)
Calcium: 8.5 mg/dL — ABNORMAL LOW (ref 8.9–10.3)
Chloride: 109 mmol/L (ref 98–111)
Creatinine: 1.26 mg/dL — ABNORMAL HIGH (ref 0.61–1.24)
GFR, Estimated: 59 mL/min — ABNORMAL LOW (ref >60)
Glucose, Bld: 90 mg/dL (ref 70–99)
Potassium: 4 mmol/L (ref 3.5–5.1)
Sodium: 140 mmol/L (ref 135–145)
Total Bilirubin: 0.9 mg/dL (ref 0.0–1.2)
Total Protein: 6.9 g/dL (ref 6.5–8.1)

## 2024-02-20 LAB — FERRITIN: Ferritin: 25 ng/mL (ref 24–336)

## 2024-02-21 ENCOUNTER — Inpatient Hospital Stay: Payer: HMO | Admitting: Oncology

## 2024-02-21 ENCOUNTER — Encounter: Payer: Self-pay | Admitting: Oncology

## 2024-02-21 VITALS — BP 135/72 | HR 56 | Temp 97.0°F | Resp 15 | Wt 173.0 lb

## 2024-02-21 DIAGNOSIS — Z8546 Personal history of malignant neoplasm of prostate: Secondary | ICD-10-CM

## 2024-02-21 DIAGNOSIS — D473 Essential (hemorrhagic) thrombocythemia: Secondary | ICD-10-CM

## 2024-02-21 DIAGNOSIS — D508 Other iron deficiency anemias: Secondary | ICD-10-CM | POA: Diagnosis not present

## 2024-02-21 DIAGNOSIS — C674 Malignant neoplasm of posterior wall of bladder: Secondary | ICD-10-CM

## 2024-02-21 DIAGNOSIS — N1832 Chronic kidney disease, stage 3b: Secondary | ICD-10-CM | POA: Diagnosis not present

## 2024-02-21 DIAGNOSIS — I82409 Acute embolism and thrombosis of unspecified deep veins of unspecified lower extremity: Secondary | ICD-10-CM

## 2024-02-21 NOTE — Assessment & Plan Note (Signed)
Non invasive bladder cancer s/p  tx'd with TURBT in 2020 and then BCG, follow up with urology 

## 2024-02-21 NOTE — Assessment & Plan Note (Signed)
Diagnosed in 2013,treated with cryoablation.  Follow up with urology

## 2024-02-21 NOTE — Assessment & Plan Note (Signed)
 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 He tolerates hydroxyurea well. Platelet is wnl, in 200,000s  Adjust current regimen to Hydroxyurea 500mg  daily for 6 days per week.

## 2024-02-21 NOTE — Assessment & Plan Note (Addendum)
 Recurrent DVT and embolism, s/p IVC filter, s/p IVC filter removal  Continue long term anticoagulation with Eliquis  5mg  BID [ patient gets refills through PCP/vascular surgeon's office]. Also on Plavix .

## 2024-02-21 NOTE — Assessment & Plan Note (Signed)
 Encourage oral hydration and avoid nephrotoxins.

## 2024-02-21 NOTE — Progress Notes (Signed)
 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 He tolerates hydroxyurea  well. Platelet is wnl, in 200,000s  Adjust current regimen to Hydroxyurea  500mg  daily for 6 days per week.     Iron  deficiency anemia Lab Results  Component Value Date   HGB 14.7 02/20/2024   TIBC 294 02/20/2024   IRONPCTSAT 18 02/20/2024   FERRITIN 25 02/20/2024  Stable iron  store.  In the context of his CKD, I recommend patient to continue Vitron C 2-3 times per week.  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 Also on Plavix .    Bladder cancer (HCC) Non invasive bladder cancer s/p  tx'd with TURBT in 2020 and then BCG, follow up with urology  CKD (chronic kidney disease), stage III (HCC) Encourage oral hydration and avoid nephrotoxins.    History of prostate cancer Diagnosed in 2013,treated with cryoablation.  Follow up with urology   No orders of the defined types were placed in this encounter.  Follow up  3 months lab Phillip [name]  All questions were answered. The patient knows to call the clinic with any problems, questions or concerns.  Phillip [name], Phillip [name], Phillip [name] 02/21/2024      Pertinent hematology [name] history   Patient has significant history of previous DVT and PE.   Patient was seen by me in March 2020 and declined further follow-up. 2013 history of prostate cancer treated with cryo therapy  01/03/2016 history of superficial venous thrombosis demonstrated in the greater saphenous vein-.  Also history  of superficial thrombophlebitis of segment  of left the basal leg vein in the upper arm-08/24/2017.  No anticoagulation was recommended at that time due to the SVT features. 11/25/2018, right lower extremity acute DVT, started on Lovenox  for about a week, 12/05/2018, patient underwent TURBT and intravesical instillation of gemcitabine  for noninvasive low-grade papillary urothelial carcinoma.  Lovenox  was held for 2 days prior and for 24 hours after procedure, and also started on Eliquis  12/15/2018, developed acute nonocclusive segmental and subsegmental pulmonary emboli within the right upper lobe, right middle lobe, right lower lobe pulmonary branches.  Patient was admitted to the hospital and started on heparin  drip.  Patient was seen by me during his admission at that time.  Lovenox  1 mg/kg twice daily was recommended at discharge.  He declined further follow-up with me in the clinic.   His anticoagulation was managed by primary care provider Dr. [name].  Patient was on Coumadin  for anticoagulation until Feb 2023.  Patient did not check INR since September 2022.  Patient also developed supratherapeutic INR due to interaction between Coumadin  and Diflucan for treatment of esophageal candidiasis.  Patient was off Coumadin  on 11/17/2021.  11/21/2021 developed acute nonocclusive DVT, started on Lovenox  decision was made to switch to Eliquis .   03/09/2022, artificial urinary sphincter placement.  His Eliquis  was held for 1 week prior to the procedure.  He has noticed left lower extremity pain 1 to 2 days after holding Eliquis . 03/11/2022, ER visit due to progressively worsening left lower extremity pain. CT angio aortobifemoral with and without contrast showed eccentric  thrombus in the left outflow vessels with distal occlusion of left renal vessels.  Delayed filling of the right popliteal artery is concerning for underlying occlusions and thromboembolic disease in the right runoff vessels Started on heparin  drip in  ED. 03/11/2022, vascular surgeon lower extremity bypass surgery 03/12/2022, severe left lower extremity pain, return to the OR for left lower extremity thrombectomy.  Continued on heparin  drip. 03/16/2022, left lower extremity extremity nonsalvageable.  Status post BKA.  Per my discussion with pharmacy, heparin  was held around 9 AM on 03/16/2022 and resumed on 03/17/2022 9:30 PM. . 03/17/2022, acute respiratory failure, CT chest angiogram showed bilateral central pulm emboli involving the main pulmonary arteries and extending into all lobar branches with nearly occlusive clot to the right lower lobe, left upper lobe, several segmental branches.  CT evidence of right heart strain.Mildly increased sclerotic focus    03/18/2022, thrombolysis, mechanical thrombectomy.  Heparin  gtt restarted after procedure. 03/19/2022, heparin  gtt was continued. 03/19/2022, bilateral lower extremity ultrasound showed thrombosis of right femoral popliteal and calf veins.  Partially thrombosed left profunda femoris vein. 03/19/2022 IVC filter placement. 03/20/2022, still have significant hypoxia.  On heparin  gtt. 03/21/2022 -03/22/2022, on  heparin  gtt. 03/23/2022, CT angio chest.showed increased clot burden within right pulmonary artery and the right lower lobe pulmonary emboli with similar clot burden in the right middle lobe, right upper lobe, left lower lobe.  Single residual fibrin strand/clot at the bifurcation of the pulmonary artery with decreased clot burden in the left upper lobe artery branches.  Persistent right heart strain.  Findings suspicious for developing pulmonary infarcts. 03/23/2022, mechanical embolectomy of pulmonary arteries as well as right external iliac vein, common iliac vein and inferior vena cava.   Given that patient developed recurrent PE/DVT despite being therapeutic on heparin  GTT, post thrombectomy, patient was switched to Angiomax  and later started on Argatroban  drip  Patient had  hypercoagulable work-up  done during the admission. Work-up showed negative factor V Leiden mutation, prothrombin gene imaging.  Negative anticardiolipin antibodies, lupus anticoagulants.  Negative beta-2  glycoprotein antibodies Positive JAK2 V6 71F mutation Patient was discharged on Eliquis .     06/01/22 Bone marrow biopsy showed  BONE MARROW, ASPIRATE, CLOT, CORE:  -Hypercellular marrow (60%) involved by a JAK2 mutated myeloproliferative neoplasm (see comment) Comment: The overall findings are consistent with a myeloproliferative neoplasm. Diagnostic considerations include essential thrombocythemia, polycythemia vera and pre- fibrotic primary myelofibrosis. Importantly, there is no overt increase in blasts or fibrosis.  However, correlation with erythropoietin levels, iron  studies, presence/absence of splenomegaly and serum LDH along with cytogenetic studies would be  helpful for a more definitive assessment.   PERIPHERAL BLOOD:   -Thrombocytosis  - Mild leukocytosis  - Normocytic normochromic anemia   Cytogenetics normal  Status post IVC filter removal.   INTERVAL HISTORY Phillip [name] is a 78 y.o. male who has above history reviewed by me today presents for follow up visit for  recurrent thrombosis, Jak2 V671F mutation myeloproliferative disease  Patient tolerates Hydroxyurea  500 mg daily,  6 days/week.Phillip [name] He wears left lower extremity prosthetics.   Patient is on anticoagulation with Eliquis  and Plavix .  Tolerates well.  No bleeding events.  MEDICAL HISTORY:  Past Medical History:  Diagnosis Date   Actinic keratosis    Arthritis    Basal cell carcinoma    L clavicle, L prox forearm- removed years ago    Bladder cancer (HCC)    Bladder neck contracture    CAD (coronary artery disease)    a.  12/2020 NSTEMI/Cath: LM nl, LAD 95ost/p, LCX 50p, RCA nl. EF 45-50%.   Cataract    CKD (chronic kidney disease), stage III [facility])    ED (erectile dysfunction)    Frequency    GERD (gastroesophageal reflux  disease)    History of pulmonary embolus (PE) 11/2018   a. Following LE DVT-->Chronic warfarin.   Hyperlipidemia LDL goal <70    Hypertension    Hypothyroidism    Incontinence of urine    sui, s/p cryoablation   Ischemic cardiomyopathy    a. 11/2018 Echo: EF 60-65%; b. 12/2020 LV Gram: EF 45-50% in setting of NSTEMI.   Kidney stones    Neuropathy    feet   Nocturia    Prostate cancer (HCC)    S/P   CRYOABLATION   Right elbow tendinitis    Right Lower Extremity DVT (deep venous thrombosis) (HCC) 11/25/2018   Vertigo    1-2x/yr   Wears glasses     SURGICAL HISTORY: Past Surgical History:  Procedure Laterality Date   AMPUTATION Left 03/16/2022   Procedure: AMPUTATION BELOW KNEE;  Surgeon: Jackquelyn Mass, Phillip [name];  Location: [facility] ORS;  Service: Vascular;  Laterality: Left;   ARTHROPLASTY AND TENDON REPAIR LEFT THUMB  JAN 2014   CATARACT EXTRACTION W/PHACO Left 11/16/2020   Procedure: CATARACT EXTRACTION PHACO AND INTRAOCULAR LENS PLACEMENT (IOC) LEFT  7.09 00:56.4 12.6%;  Surgeon: Annell Kidney, Phillip [name];  Location: [facility];  Service: Ophthalmology;  Laterality: Left;   CATARACT EXTRACTION W/PHACO Right 12/02/2020   Procedure: CATARACT EXTRACTION PHACO AND INTRAOCULAR LENS PLACEMENT (IOC) RIGHT MALYUGIN 5.52 00:49.7 11.1%;  Surgeon: Annell Kidney, Phillip [name];  Location: [facility]ERY [facility];  Service: Ophthalmology;  Laterality: Right;   COLONOSCOPY     COLONOSCOPY WITH PROPOFOL  N/A 08/09/2017   Procedure: COLONOSCOPY WITH PROPOFOL ;  Surgeon: Cassie Click, Phillip [name];  Location: [facility];  Service: Endoscopy;  Laterality: N/A;   CORONARY ANGIOGRAPHY N/A 12/28/2020   Procedure: CORONARY ANGIOGRAPHY;  Surgeon: Arnoldo Lapping, Phillip [name];  Location: [facility] INVASIVE CV LAB;  Service: Cardiovascular;  Laterality: N/A;   CORONARY STENT INTERVENTION N/A 12/28/2020   Procedure: CORONARY STENT INTERVENTION;  Surgeon: Arnoldo Lapping, Phillip [name];  Location: [facility] INVASIVE CV LAB;  Service: Cardiovascular;   Laterality: N/A;   ESOPHAGOGASTRODUODENOSCOPY (EGD) WITH PROPOFOL  N/A 08/09/2017   Procedure: ESOPHAGOGASTRODUODENOSCOPY (EGD) WITH PROPOFOL ;  Surgeon: Cassie Click, Phillip [name];  Location: [facility] ENDOSCOPY;  Service: Endoscopy;  Laterality: N/A;   GREEN LIGHT LASER TURP (TRANSURETHRAL RESECTION OF PROSTATE N/A 12/14/2015   Procedure: GREEN LIGHT LASER TURP (TRANSURETHRAL RESECTION OF PROSTATE) LASER OF BLADDER NECK CONTRACTURE;  Surgeon: Annamarie Kid, Phillip [name];  Location: [facility];  Service: Urology;  Laterality: N/A;   IVC FILTER INSERTION N/A 03/19/2022   Procedure: IVC FILTER INSERTION;  Surgeon: Margherita Shell, Phillip [name];  Location: [facility] INVASIVE CV LAB;  Service: Cardiovascular;  Laterality: N/A;   IVC FILTER REMOVAL N/A 11/01/2022   Procedure: IVC FILTER REMOVAL;  Surgeon: Jackquelyn Mass, Phillip [name];  Location: [facility] INVASIVE CV LAB;  Service: Cardiovascular;  Laterality: N/A;   LEFT HEART CATH AND CORONARY ANGIOGRAPHY N/A 12/24/2020   Procedure: LEFT HEART CATH AND CORONARY ANGIOGRAPHY and possible PCI and stent;  Surgeon: Antonette Batters, Phillip [name];  Location: [facility] INVASIVE CV LAB;  Service: Cardiovascular;  Laterality: N/A;   LEFT HEART CATH AND CORONARY ANGIOGRAPHY N/A 01/06/2021   Procedure: LEFT HEART CATH AND CORONARY ANGIOGRAPHY;  Surgeon: Millicent Ally, Phillip [name];  Location: [facility] INVASIVE CV LAB;  Service: Cardiovascular;  Laterality: N/A;   LOWER EXTREMITY ANGIOGRAPHY Left 03/11/2022   Procedure: Lower Extremity Angiography;  Surgeon: Jackquelyn Mass, Phillip [name];  Location: [facility] INVASIVE CV LAB;  Service: Cardiovascular;  Laterality: Left;   PENILE PROSTHESIS IMPLANT N/A 12/06/2013   Procedure: PENILE PROTHESIS INFLATABLE;  Surgeon: Edmund Gouge, Phillip [name];  Location: [facility];  Service: Urology;  Laterality: N/A;   PROSTATE CRYOABLATION  06-01-2012  [facility]   PULMONARY THROMBECTOMY N/A 03/18/2022   Procedure: PULMONARY THROMBECTOMY;  Surgeon: Celso College, Phillip [name];  Location: [facility] INVASIVE CV  LAB;  Service: Cardiovascular;  Laterality: N/A;   PULMONARY THROMBECTOMY Bilateral 03/23/2022   Procedure: PULMONARY THROMBECTOMY;  Surgeon: Jackquelyn Mass, Phillip [name];  Location: [facility] INVASIVE CV LAB;  Service: Cardiovascular;  Laterality: Bilateral;   THROMBECTOMY ILIAC ARTERY Left 03/12/2022   Procedure: THROMBECTOMY LEG;  Surgeon: Arvil Lauber, Phillip [name];  Location: [facility] OR[facility];  Service: Vascular;  Laterality: Left;   TRANSURETHRAL RESECTION OF BLADDER NECK N/A 03/20/2015   Procedure: RELEASE BLADDER NECK CONTRACTURE  WITH WOLF BUTTON ELECTRODE ;  Surgeon: Annamarie Kid, Phillip [name];  Location: [facility];  Service: Urology;  Laterality: N/A;   TRANSURETHRAL RESECTION OF BLADDER TUMOR N/A 12/05/2018   Procedure: TRANSURETHRAL RESECTION OF BLADDER TUMOR (TURBT)/CYSTOSCOPY/  INSTILLATION OF Steffi Edu;  Surgeon: Adelbert Homans, Phillip [name];  Location: [facility];  Service: Urology;  Laterality: N/A;   TRANSURETHRAL RESECTION OF BLADDER TUMOR N/A 01/15/2020   Procedure: TRANSURETHRAL RESECTION OF BLADDER TUMOR (TURBT)/ CYSTOSCOPY;  Surgeon: Adelbert Homans, Phillip [name];  Location: [facility];  Service: Urology;  Laterality: N/A;   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      SOCIAL HISTORY: Social History   Socioeconomic History   Marital status: Married    Spouse name: Gayle Collard   Number of children: 3   Years of education: Not on file   Highest education level: Not on file  Occupational History   Not on file  Tobacco Use   Smoking status: Never    Passive exposure: Never   Smokeless tobacco: Never  Vaping Use   Vaping status: Never Used  Substance and Sexual Activity   Alcohol use: No   Drug use: No   Sexual activity: Not Currently    Birth control/protection: None  Other Topics Concern   Not on file  Social History Narrative   Lives locally with wife.  Exercises regularly - gym/golf.  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   3 sons: 1 is a Engineer, civil (consulting).    Social Drivers of Corporate investment banker Strain: Low Risk  (06/16/2023)   Received from [facility]   Overall Financial Resource Strain (CARDIA)    Difficulty of Paying Living Expenses: Not hard at all  Food Insecurity: No Food Insecurity (06/16/2023)   Received from [facility]   Hunger Vital Sign    Worried About Running Out of Food in the Last Year: Never true    Ran Out of Food in the Last Year: Never true  Transportation Needs: No Transportation Needs (06/16/2023)   Received [facility] - Transportation    In the past 12 months, has lack of transportation kept you from medical appointments or from getting medications?: No    Lack of Transportation (Non-Medical): No  Physical Activity: Not on file  Stress: Not on file  Social Connections: Not on  file  Intimate Partner Violence: Not on file    FAMILY HISTORY: Family History  Problem Relation Age of Onset   CAD Father 62    ALLERGIES:  is allergic to doxazosin.  MEDICATIONS:  Current Outpatient Medications  Medication Sig Dispense Refill   acetaminophen  (TYLENOL ) 500 MG tablet Take 500 mg by mouth every 6 (six) hours as needed.     Alpha-Lipoic Acid 600 MG TABS Take 1 tablet by mouth daily.     apixaban  (ELIQUIS ) 5 MG TABS tablet TAKE ONE TABLET BY MOUTH TWICE DAILY 60 tablet 2   ascorbic acid (VITAMIN C) 500 MG tablet Take 500 mg by mouth daily.     cetirizine (ZYRTEC) 10 MG tablet Take 10 mg by mouth daily.     clopidogrel  (PLAVIX ) 75 MG tablet Take 1 tablet (75 mg total) by mouth daily with breakfast. 90 tablet 3   co-enzyme Q-10 30 MG capsule Take 30 mg by mouth daily.     glipiZIDE (GLUCOTROL XL) 2.5 MG 24 hr tablet Take by mouth every other day.     hydroxyurea  (HYDREA ) 500 MG capsule Take 1 capsule (500 mg total) by mouth See admin instructions. May take with food to minimize  GI side effects.Take 500mg  daily 6 days per week. 90 capsule 1   IRON  CR PO Take by mouth. Once a week     metoprolol  tartrate (LOPRESSOR ) 25 MG tablet Take 0.5 tablets (12.5 mg total) by mouth 2 (two) times daily. 60 tablet 0   Multiple Vitamin (MULTIVITAMIN) tablet Take 1 tablet by mouth daily.     nitroGLYCERIN  (NITROSTAT ) 0.4 MG SL tablet Place 1 tablet (0.4 mg total) under the tongue every 5 (five) minutes x 3 doses as needed for chest pain. 25 tablet 3   pantoprazole  (PROTONIX ) 40 MG tablet Take 1 tablet (40 mg total) by mouth daily. 90 tablet 3   pravastatin  (PRAVACHOL ) 40 MG tablet Take 40 mg by mouth daily.     tadalafil  (CIALIS ) 5 MG tablet Take 5 mg by mouth daily.     TRUE METRIX BLOOD GLUCOSE TEST test strip SMARTSIG:Via Meter     TRUEplus Lancets 33G MISC      Turmeric (QC TUMERIC COMPLEX PO) Take by mouth.     levothyroxine  (SYNTHROID ) 50 MCG tablet Take 50 mcg by mouth.     No current facility-administered medications for this visit.    Review of Systems  Constitutional:  Negative for appetite change, chills, fatigue, fever and unexpected weight change.  HENT:   Negative for hearing loss and voice change.   Eyes:  Negative for eye problems and icterus.  Respiratory:  Negative for chest tightness, cough and shortness of breath.   Cardiovascular:  Negative for chest pain and leg swelling.  Gastrointestinal:  Negative for abdominal distention and abdominal pain.  Endocrine: Negative for hot flashes.  Genitourinary:  Negative for difficulty urinating, dysuria and frequency.   Musculoskeletal:  Negative for arthralgias.       Left BKA  Skin:  Negative for itching and rash.  Neurological:  Negative for light-headedness and numbness.  Hematological:  Negative for adenopathy. Does not bruise/bleed easily.  Psychiatric/Behavioral:  Negative for confusion.    PHYSICAL EXAMINATION:  Vitals:   02/21/24 1021  BP: 135/72  Pulse: (!) 56  Resp: 15  Temp: (!) 97 F (36.1 C)   SpO2: 98%   Filed Weights   02/21/24 1021  Weight: 173 lb (78.5 kg)    Physical Exam Constitutional:  General: He is not in acute distress. HENT:     Head: Normocephalic and atraumatic.  Eyes:     General: No scleral icterus. Cardiovascular:     Rate and Rhythm: Normal rate.  Pulmonary:     Effort: Pulmonary effort is normal. No respiratory distress.     Breath sounds: No wheezing.  Abdominal:     General: Bowel sounds are normal. There is no distension.     Palpations: Abdomen is soft.  Musculoskeletal:        General: Normal range of motion.     Cervical back: Normal range of motion and neck supple.     Comments: Status post left BKA, + left lower limb prosthetics.   Skin:    General: Skin is warm and dry.     Findings: No erythema or rash.  Neurological:     Mental Status: He is alert and oriented to person, place, and time. Mental status is at baseline.     Cranial Nerves: No cranial nerve deficit.     Coordination: Coordination normal.  Psychiatric:        Mood and Affect: Mood normal.     LABORATORY DATA:  I have reviewed the data as listed    Latest Ref Rng & Units 02/20/2024    3:18 PM 11/15/2023    9:48 AM 08/07/2023    2:05 PM  CBC  WBC 4.0 - 10.5 K/uL 8.0  6.9  9.3   Hemoglobin 13.0 - 17.0 g/dL 13.0  86.5  78.4   Hematocrit 39.0 - 52.0 % 45.2  44.5  44.0   Platelets 150 - 400 K/uL 291  287  273       Latest Ref Rng & Units 02/20/2024    3:19 PM 11/15/2023    9:48 AM 08/07/2023    2:05 PM  CMP  Glucose 70 - 99 mg/dL 90  696  295   BUN 8 - 23 mg/dL 18  22  14    Creatinine 0.61 - 1.24 mg/dL 2.84  1.32  4.40   Sodium 135 - 145 mmol/L 140  140  141   Potassium 3.5 - 5.1 mmol/L 4.0  3.8  3.6   Chloride 98 - 111 mmol/L 109  109  106   CO2 22 - 32 mmol/L 24  23  27    Calcium  8.9 - 10.3 mg/dL 8.5  8.9  8.4   Total Protein 6.5 - 8.1 g/dL 6.9  6.7  6.3   Total Bilirubin 0.0 - 1.2 mg/dL 0.9  0.9  0.6   Alkaline Phos 38 - 126 U/L 55  47  48   AST 15 -  41 U/L 22  23  22    ALT 0 - 44 U/L 21  22  23        RADIOGRAPHIC STUDIES: I have personally reviewed the radiological images as listed and agreed with the findings in the report. No results found.

## 2024-02-21 NOTE — Assessment & Plan Note (Signed)
 Lab Results  Component Value Date   HGB 14.7 02/20/2024   TIBC 294 02/20/2024   IRONPCTSAT 18 02/20/2024   FERRITIN 25 02/20/2024  Stable iron  store.  In the context of his CKD, I recommend patient to continue Vitron C 2-3 times per week.

## 2024-03-13 DIAGNOSIS — M6281 Muscle weakness (generalized): Secondary | ICD-10-CM | POA: Diagnosis not present

## 2024-03-13 DIAGNOSIS — Z89512 Acquired absence of left leg below knee: Secondary | ICD-10-CM | POA: Diagnosis not present

## 2024-03-29 ENCOUNTER — Other Ambulatory Visit (INDEPENDENT_AMBULATORY_CARE_PROVIDER_SITE_OTHER): Payer: Self-pay | Admitting: Nurse Practitioner

## 2024-03-29 DIAGNOSIS — I7090 Unspecified atherosclerosis: Secondary | ICD-10-CM

## 2024-03-29 DIAGNOSIS — Z89512 Acquired absence of left leg below knee: Secondary | ICD-10-CM

## 2024-04-01 ENCOUNTER — Ambulatory Visit (INDEPENDENT_AMBULATORY_CARE_PROVIDER_SITE_OTHER): Payer: HMO | Admitting: Vascular Surgery

## 2024-04-01 ENCOUNTER — Ambulatory Visit (INDEPENDENT_AMBULATORY_CARE_PROVIDER_SITE_OTHER): Payer: HMO

## 2024-04-01 ENCOUNTER — Encounter (INDEPENDENT_AMBULATORY_CARE_PROVIDER_SITE_OTHER): Payer: Self-pay | Admitting: Vascular Surgery

## 2024-04-01 VITALS — BP 139/69 | HR 57 | Ht 69.0 in | Wt 173.2 lb

## 2024-04-01 DIAGNOSIS — I739 Peripheral vascular disease, unspecified: Secondary | ICD-10-CM

## 2024-04-01 DIAGNOSIS — Z89512 Acquired absence of left leg below knee: Secondary | ICD-10-CM | POA: Diagnosis not present

## 2024-04-01 DIAGNOSIS — I25110 Atherosclerotic heart disease of native coronary artery with unstable angina pectoris: Secondary | ICD-10-CM | POA: Diagnosis not present

## 2024-04-01 DIAGNOSIS — I82409 Acute embolism and thrombosis of unspecified deep veins of unspecified lower extremity: Secondary | ICD-10-CM | POA: Diagnosis not present

## 2024-04-01 DIAGNOSIS — I480 Paroxysmal atrial fibrillation: Secondary | ICD-10-CM | POA: Diagnosis not present

## 2024-04-01 DIAGNOSIS — I7090 Unspecified atherosclerosis: Secondary | ICD-10-CM | POA: Diagnosis not present

## 2024-04-01 DIAGNOSIS — I10 Essential (primary) hypertension: Secondary | ICD-10-CM | POA: Diagnosis not present

## 2024-04-03 LAB — VAS US ABI WITH/WO TBI: Right ABI: 1.14

## 2024-04-06 ENCOUNTER — Encounter (INDEPENDENT_AMBULATORY_CARE_PROVIDER_SITE_OTHER): Payer: Self-pay | Admitting: Vascular Surgery

## 2024-04-06 DIAGNOSIS — I739 Peripheral vascular disease, unspecified: Secondary | ICD-10-CM | POA: Insufficient documentation

## 2024-04-06 NOTE — Progress Notes (Signed)
 MRN : [id number]  Phillip [name] is a 78 y.o. (1946/09/13) male who presents with chief complaint of check circulation.  History of Present Illness:   The patient presents today for evaluation of his ABIs of his right lower extremity. He has a previous left below-knee amputation. His prosthetic was recently revised and he notes that he has a much better quality of life however recently he has been having some issues with it especially behind his knee. He does have a follow-up soon to have this evaluated. He denies claudication-like symptoms.. Currently no rest pain or ulcerations present.   ABI's done today Rt=1.14 (triphasic) and Left BKA (previous ABI Rt=1.16 (triphasic) and Left BKA   Current Meds  Medication Sig   acetaminophen  (TYLENOL ) 500 MG tablet Take 500 mg by mouth every 6 (six) hours as needed.   Alpha-Lipoic Acid 600 MG TABS Take 1 tablet by mouth daily.   apixaban  (ELIQUIS ) 5 MG TABS tablet TAKE ONE TABLET BY MOUTH TWICE DAILY   ascorbic acid (VITAMIN C) 500 MG tablet Take 500 mg by mouth daily.   cetirizine (ZYRTEC) 10 MG tablet Take 10 mg by mouth daily.   clopidogrel  (PLAVIX ) 75 MG tablet Take 1 tablet (75 mg total) by mouth daily with breakfast.   co-enzyme Q-10 30 MG capsule Take 30 mg by mouth daily.   glipiZIDE (GLUCOTROL XL) 2.5 MG 24 hr tablet Take by mouth every other day.   hydroxyurea  (HYDREA ) 500 MG capsule Take 1 capsule (500 mg total) by mouth See admin instructions. May take with food to minimize GI side effects.Take 500mg  daily 6 days per week.   IRON  CR PO Take by mouth. Once a week   metoprolol  tartrate (LOPRESSOR ) 25 MG tablet Take 0.5 tablets (12.5 mg total) by mouth 2 (two) times daily.   Multiple Vitamin (MULTIVITAMIN) tablet Take 1 tablet by mouth daily.   nitroGLYCERIN  (NITROSTAT ) 0.4 MG SL tablet Place 1 tablet (0.4 mg total) under the tongue every 5 (five) minutes x 3 doses as  needed for chest pain.   pantoprazole  (PROTONIX ) 40 MG tablet Take 1 tablet (40 mg total) by mouth daily.   pravastatin  (PRAVACHOL ) 40 MG tablet Take 40 mg by mouth daily.   tadalafil  (CIALIS ) 5 MG tablet Take 5 mg by mouth daily.   TRUE METRIX BLOOD GLUCOSE TEST test strip SMARTSIG:Via Meter   TRUEplus Lancets 33G MISC    Turmeric (QC TUMERIC COMPLEX PO) Take by mouth.    Past Medical History:  Diagnosis Date   Actinic keratosis    Arthritis    Basal cell carcinoma    L clavicle, L prox forearm- removed years ago    Bladder cancer (HCC)    Bladder neck contracture    CAD (coronary artery disease)    a. 12/2020 NSTEMI/Cath: LM nl, LAD 95ost/p, LCX 50p, RCA nl. EF 45-50%.   Cataract    CKD (chronic kidney disease), stage III [facility])    ED (erectile dysfunction)    Frequency    GERD (gastroesophageal reflux disease)    History of pulmonary embolus (PE) 11/2018  a. Following LE DVT-->Chronic warfarin.   Hyperlipidemia LDL goal <70    Hypertension    Hypothyroidism    Incontinence of urine    sui, s/p cryoablation   Ischemic cardiomyopathy    a. 11/2018 Echo: EF 60-65%; b. 12/2020 LV Gram: EF 45-50% in setting of NSTEMI.   Kidney stones    Neuropathy    feet   Nocturia    Prostate cancer (HCC)    S/P   CRYOABLATION   Right elbow tendinitis    Right Lower Extremity DVT (deep venous thrombosis) (HCC) 11/25/2018   Vertigo    1-2x/yr   Wears glasses     Past Surgical History:  Procedure Laterality Date   AMPUTATION Left 03/16/2022   Procedure: AMPUTATION BELOW KNEE;  Surgeon: Jama Cordella MATSU, MD;  Location: [facility];  Service: Vascular;  Laterality: Left;   ARTHROPLASTY AND TENDON REPAIR LEFT THUMB  JAN 2014   CATARACT EXTRACTION W/PHACO Left 11/16/2020   Procedure: CATARACT EXTRACTION PHACO AND INTRAOCULAR LENS PLACEMENT (IOC) LEFT  7.09 00:56.4 12.6%;  Surgeon: Mittie Gaskin, MD;  Location: [facility];  Service: Ophthalmology;  Laterality: Left;   CATARACT  EXTRACTION W/PHACO Right 12/02/2020   Procedure: CATARACT EXTRACTION PHACO AND INTRAOCULAR LENS PLACEMENT (IOC) RIGHT MALYUGIN 5.52 00:49.7 11.1%;  Surgeon: Mittie Gaskin, MD;  Location: [facility];  Service: Ophthalmology;  Laterality: Right;   COLONOSCOPY     COLONOSCOPY WITH PROPOFOL  N/A 08/09/2017   Procedure: COLONOSCOPY WITH PROPOFOL ;  Surgeon: Viktoria Lamar DASEN, MD;  Location: [facility] ENDOSCOPY;  Service: Endoscopy;  Laterality: N/A;   CORONARY ANGIOGRAPHY N/A 12/28/2020   Procedure: CORONARY ANGIOGRAPHY;  Surgeon: Wonda Sharper, MD;  Location: [facility] INVASIVE CV LAB;  Service: Cardiovascular;  Laterality: N/A;   CORONARY STENT INTERVENTION N/A 12/28/2020   Procedure: CORONARY STENT INTERVENTION;  Surgeon: Wonda Sharper, MD;  Location: [facility] INVASIVE CV LAB;  Service: Cardiovascular;  Laterality: N/A;   ESOPHAGOGASTRODUODENOSCOPY (EGD) WITH PROPOFOL  N/A 08/09/2017   Procedure: ESOPHAGOGASTRODUODENOSCOPY (EGD) WITH PROPOFOL ;  Surgeon: Viktoria Lamar DASEN, MD;  Location: [facility] ENDOSCOPY;  Service: Endoscopy;  Laterality: N/A;   GREEN LIGHT LASER TURP (TRANSURETHRAL RESECTION OF PROSTATE N/A 12/14/2015   Procedure: GREEN LIGHT LASER TURP (TRANSURETHRAL RESECTION OF PROSTATE) LASER OF BLADDER NECK CONTRACTURE;  Surgeon: Arlena Gal, MD;  Location: [facility] [place];  Service: Urology;  Laterality: N/A;   IVC FILTER INSERTION N/A 03/19/2022   Procedure: IVC FILTER INSERTION;  Surgeon: Serene Gaile ORN, MD;  Location: [facility] INVASIVE CV LAB;  Service: Cardiovascular;  Laterality: N/A;   IVC FILTER REMOVAL N/A 11/01/2022   Procedure: IVC FILTER REMOVAL;  Surgeon: Jama Cordella MATSU, MD;  Location: [facility] INVASIVE CV LAB;  Service: Cardiovascular;  Laterality: N/A;   LEFT HEART CATH AND CORONARY ANGIOGRAPHY N/A 12/24/2020   Procedure: LEFT HEART CATH AND CORONARY ANGIOGRAPHY and possible PCI and stent;  Surgeon: Florencio Cara BIRCH, MD;  Location: [facility] INVASIVE CV LAB;  Service:  Cardiovascular;  Laterality: N/A;   LEFT HEART CATH AND CORONARY ANGIOGRAPHY N/A 01/06/2021   Procedure: LEFT HEART CATH AND CORONARY ANGIOGRAPHY;  Surgeon: Burnard Debby LABOR, MD;  Location: [facility] INVASIVE CV LAB;  Service: Cardiovascular;  Laterality: N/A;   LOWER EXTREMITY ANGIOGRAPHY Left 03/11/2022   Procedure: Lower Extremity Angiography;  Surgeon: Jama Cordella MATSU, MD;  Location: [facility] INVASIVE CV LAB;  Service: Cardiovascular;  Laterality: Left;   PENILE PROSTHESIS IMPLANT N/A 12/06/2013   Procedure: PENILE PROTHESIS INFLATABLE;  Surgeon: Arlena LILLETTE Gal, MD;  Location: [facility]  [place];  Service: Urology;  Laterality: N/A;   PROSTATE CRYOABLATION  06-01-2012  [place]   PULMONARY THROMBECTOMY N/A 03/18/2022   Procedure: PULMONARY THROMBECTOMY;  Surgeon: Marea Selinda RAMAN, MD;  Location: [facility] INVASIVE CV LAB;  Service: Cardiovascular;  Laterality: N/A;   PULMONARY THROMBECTOMY Bilateral 03/23/2022   Procedure: PULMONARY THROMBECTOMY;  Surgeon: Jama Cordella MATSU, MD;  Location: [facility] INVASIVE CV LAB;  Service: Cardiovascular;  Laterality: Bilateral;   THROMBECTOMY ILIAC ARTERY Left 03/12/2022   Procedure: THROMBECTOMY LEG;  Surgeon: Laurence Redell CROME, MD;  Location: [facility];  Service: Vascular;  Laterality: Left;   TRANSURETHRAL RESECTION OF BLADDER NECK N/A 03/20/2015   Procedure: RELEASE BLADDER NECK CONTRACTURE  WITH WOLF BUTTON ELECTRODE ;  Surgeon: Arlena Gal, MD;  Location: [facility];  Service: Urology;  Laterality: N/A;   TRANSURETHRAL RESECTION OF BLADDER TUMOR N/A 12/05/2018   Procedure: TRANSURETHRAL RESECTION OF BLADDER TUMOR (TURBT)/CYSTOSCOPY/  INSTILLATION OF LEOCADIA;  Surgeon: Devere Lonni Righter, MD;  Location: [facility];  Service: Urology;  Laterality: N/A;   TRANSURETHRAL RESECTION OF BLADDER TUMOR N/A 01/15/2020   Procedure: TRANSURETHRAL RESECTION OF BLADDER TUMOR (TURBT)/ CYSTOSCOPY;  Surgeon: Devere Lonni Righter, MD;   Location: [facility];  Service: Urology;  Laterality: N/A;   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      Social History Social History   Tobacco Use   Smoking status: Never    Passive exposure: Never   Smokeless tobacco: Never  Vaping Use   Vaping status: Never Used  Substance Use Topics   Alcohol use: No   Drug use: No    Family History Family History  Problem Relation Age of Onset   CAD Father 53    Allergies  Allergen Reactions   Doxazosin Other (See Comments)    Other reaction(s): Unknown     REVIEW OF SYSTEMS (Negative unless checked)  Constitutional: [] Weight loss  [] Fever  [] Chills Cardiac: [] Chest pain   [] Chest pressure   [] Palpitations   [] Shortness of breath when laying flat   [] Shortness of breath with exertion. Vascular:  [x] Pain in legs with walking   [] Pain in legs at rest  [] History of DVT   [] Phlebitis   [] Swelling in legs   [] Varicose veins   [] Non-healing ulcers Pulmonary:   [] Uses home oxygen   [] Productive cough   [] Hemoptysis   [] Wheeze  [] COPD   [] Asthma Neurologic:  [] Dizziness   [] Seizures   [] History of stroke   [] History of TIA  [] Aphasia   [] Vissual changes   [] Weakness or numbness in arm   [] Weakness or numbness in leg Musculoskeletal:   [] Joint swelling   [] Joint pain   [] Low back pain Hematologic:  [] Easy bruising  [] Easy bleeding   [] Hypercoagulable state   [] Anemic Gastrointestinal:  [] Diarrhea   [] Vomiting  [] Gastroesophageal reflux/heartburn   [] Difficulty swallowing. Genitourinary:  [] Chronic kidney disease   [] Difficult urination  [] Frequent urination   [] Blood in urine Skin:  [] Rashes   [] Ulcers  Psychological:  [] History of anxiety   []  History of major depression.  Physical Examination  Vitals:   04/01/24 0940  BP: 139/69  Pulse: (!) 57  Weight: 173 lb 4 oz (78.6 kg)  Height: 5' 9 (1.753 m)   Body mass index is 25.58 kg/m. Gen: WD/WN, NAD Head: [place]/AT, No temporalis wasting.   Ear/Nose/Throat: Hearing grossly intact, nares w/o erythema or drainage Eyes: PER, EOMI, sclera nonicteric.  Neck: Supple, no masses.  No bruit or JVD.  Pulmonary:  Good air movement, no audible wheezing, no use of accessory muscles.  Cardiac: RRR, normal S1, S2, no Murmurs. Vascular:  mild trophic changes, no open wounds Vessel Right Left  Radial Palpable Palpable  PT Not Palpable BKA  DP Not Palpable BKA  Gastrointestinal: soft, non-distended. No guarding/no peritoneal signs.  Musculoskeletal: M/S 5/5 throughout.  No visible deformity.  Neurologic: CN 2-12 intact. Pain and light touch intact in extremities.  Symmetrical.  Speech is fluent. Motor exam as listed above. Psychiatric: Judgment intact, Mood & affect appropriate for pt's clinical situation. Dermatologic: No rashes or ulcers noted.  No changes consistent with cellulitis.   CBC Lab Results  Component Value Date   WBC 8.0 02/20/2024   HGB 14.7 02/20/2024   HCT 45.2 02/20/2024   MCV 102.0 (H) 02/20/2024   PLT 291 02/20/2024    BMET    Component Value Date/Time   NA 140 02/20/2024 1519   NA 141 10/25/2014 2206   K 4.0 02/20/2024 1519   K 4.0 10/25/2014 2206   CL 109 02/20/2024 1519   CL 109 (H) 10/25/2014 2206   CO2 24 02/20/2024 1519   CO2 26 10/25/2014 2206   GLUCOSE 90 02/20/2024 1519   GLUCOSE 210 (H) 10/25/2014 2206   BUN 18 02/20/2024 1519   BUN 21 08/15/2016 1618   BUN 15 10/25/2014 2206   CREATININE 1.26 (H) 02/20/2024 1519   CREATININE 1.32 (H) 10/25/2014 2206   CALCIUM  8.5 (L) 02/20/2024 1519   CALCIUM  8.2 (L) 10/25/2014 2206   GFRNONAA 59 (L) 02/20/2024 1519   GFRNONAA 58 (L) 10/25/2014 2206   GFRAA >60 03/27/2019 0637   GFRAA >60 10/25/2014 2206   CrCl cannot be calculated (Patient's most recent lab result is older than the maximum 21 days allowed.).  COAG Lab Results  Component Value Date   INR 2.9 (H) 03/28/2022   INR 2.8 (H) 03/27/2022   INR 2.0 (H) 03/26/2022    Radiology VAS US   ABI WITH/WO TBI Result Date: 04/03/2024  LOWER EXTREMITY DOPPLER STUDY Patient Name:  Phillip [name]  Date of Exam:   04/01/2024 Medical Rec #: [id number]         Accession #:    [id number] Date of Birth: 1946/08/08         Patient Gender: M Patient Age:   50 years Exam Location:  [facility] Procedure:      VAS US  ABI WITH/WO TBI Referring Phys: --------------------------------------------------------------------------------  High Risk Factors: Hypertension, hyperlipidemia, prior MI.  Vascular Interventions: Left below knee amputation 03/16/2022. Comparison Study: 09/2023 Performing Technologist: Jerel Croak RVT  Examination Guidelines: A complete evaluation includes at minimum, Doppler waveform signals and systolic blood pressure reading at the level of bilateral brachial, anterior tibial, and posterior tibial arteries, when vessel segments are accessible. Bilateral testing is considered an integral part of a complete examination. Photoelectric Plethysmograph (PPG) waveforms and toe systolic pressure readings are included as required and additional duplex testing as needed. Limited examinations for reoccurring indications may be performed as noted.  ABI Findings: +---------+------------------+-----+---------+--------+ Right    Rt Pressure (mmHg)IndexWaveform Comment  +---------+------------------+-----+---------+--------+ Brachial 152                                      +---------+------------------+-----+---------+--------+ PTA      174  1.14 triphasic         +---------+------------------+-----+---------+--------+ DP       168               1.11 biphasic          +---------+------------------+-----+---------+--------+ [place] [id number]               1.01 Normal            +---------+------------------+-----+---------+--------+ +--------+------------------+-----+--------+-------+ Left    Lt Pressure (mmHg)IndexWaveformComment  +--------+------------------+-----+--------+-------+ [id number]                                    +--------+------------------+-----+--------+-------+ +-------+-----------+-----------+------------+------------+ ABI/TBIToday's ABIToday's TBIPrevious ABIPrevious TBI +-------+-----------+-----------+------------+------------+ Right  1.14       1.01       1.16        .96          +-------+-----------+-----------+------------+------------+ Left   bka                   bka                      +-------+-----------+-----------+------------+------------+ Right ABIs and TBIs appear essentially unchanged compared to prior study on 09/2023.  Summary: Right: Resting right ankle-brachial index is within normal range. The right toe-brachial index is normal. Left: BKA. *See table(s) above for measurements and observations.  Electronically signed by Cordella Shawl MD on 04/03/2024 at 2:21:32 PM.    Final      Assessment/Plan 1. 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 twice daily there will be no changes.  2. PAD (peripheral artery disease) (HCC)  Recommend:  The patient has evidence of atherosclerosis of the lower extremities with claudication.  The patient does not voice lifestyle limiting changes at this point in time.  Noninvasive studies do not suggest clinically significant change.  No invasive studies, angiography or surgery at this time The patient should continue walking and begin a more formal exercise program.  The patient should continue antiplatelet therapy and aggressive treatment of the lipid abnormalities  No changes in the patient's medications at this time  Continued surveillance is indicated as atherosclerosis is likely to progress with time.    The patient will continue follow up with noninvasive studies as ordered.  - VAS US  ABI WITH/WO TBI; Future  3. Coronary artery disease  involving native coronary artery of native heart with unstable angina pectoris (HCC) Continue cardiac and antihypertensive medications as already ordered and reviewed, no changes at this time.  Continue statin as ordered and reviewed, no changes at this time  Nitrates PRN for chest pain  4. Primary hypertension Continue antihypertensive medications as already ordered, these medications have been reviewed and there are no changes at this time.  5. Paroxysmal atrial fibrillation with RVR (HCC) Continue antiarrhythmia medications as already ordered, these medications have been reviewed and there are no changes at this time.  Continue anticoagulation as ordered by Cardiology Service    Cordella Shawl, MD  04/06/2024 12:47 PM

## 2024-04-12 DIAGNOSIS — M6281 Muscle weakness (generalized): Secondary | ICD-10-CM | POA: Diagnosis not present

## 2024-04-12 DIAGNOSIS — Z89512 Acquired absence of left leg below knee: Secondary | ICD-10-CM | POA: Diagnosis not present

## 2024-04-18 DIAGNOSIS — M7918 Myalgia, other site: Secondary | ICD-10-CM | POA: Diagnosis not present

## 2024-04-18 DIAGNOSIS — D6859 Other primary thrombophilia: Secondary | ICD-10-CM | POA: Diagnosis not present

## 2024-04-18 DIAGNOSIS — Z89512 Acquired absence of left leg below knee: Secondary | ICD-10-CM | POA: Diagnosis not present

## 2024-04-18 DIAGNOSIS — M5116 Intervertebral disc disorders with radiculopathy, lumbar region: Secondary | ICD-10-CM | POA: Diagnosis not present

## 2024-05-13 DIAGNOSIS — Z89512 Acquired absence of left leg below knee: Secondary | ICD-10-CM | POA: Diagnosis not present

## 2024-05-13 DIAGNOSIS — M6281 Muscle weakness (generalized): Secondary | ICD-10-CM | POA: Diagnosis not present

## 2024-05-23 ENCOUNTER — Encounter: Payer: Self-pay | Admitting: Oncology

## 2024-05-23 ENCOUNTER — Inpatient Hospital Stay

## 2024-05-23 ENCOUNTER — Inpatient Hospital Stay: Attending: Oncology | Admitting: Oncology

## 2024-05-23 VITALS — BP 145/69 | HR 58 | Temp 96.0°F | Resp 18 | Wt 173.4 lb

## 2024-05-23 DIAGNOSIS — I252 Old myocardial infarction: Secondary | ICD-10-CM | POA: Diagnosis not present

## 2024-05-23 DIAGNOSIS — Z7902 Long term (current) use of antithrombotics/antiplatelets: Secondary | ICD-10-CM | POA: Diagnosis not present

## 2024-05-23 DIAGNOSIS — D508 Other iron deficiency anemias: Secondary | ICD-10-CM

## 2024-05-23 DIAGNOSIS — D473 Essential (hemorrhagic) thrombocythemia: Secondary | ICD-10-CM | POA: Diagnosis not present

## 2024-05-23 DIAGNOSIS — I82409 Acute embolism and thrombosis of unspecified deep veins of unspecified lower extremity: Secondary | ICD-10-CM | POA: Diagnosis not present

## 2024-05-23 DIAGNOSIS — C674 Malignant neoplasm of posterior wall of bladder: Secondary | ICD-10-CM

## 2024-05-23 DIAGNOSIS — Z86718 Personal history of other venous thrombosis and embolism: Secondary | ICD-10-CM | POA: Insufficient documentation

## 2024-05-23 DIAGNOSIS — D509 Iron deficiency anemia, unspecified: Secondary | ICD-10-CM | POA: Diagnosis not present

## 2024-05-23 DIAGNOSIS — Z8551 Personal history of malignant neoplasm of bladder: Secondary | ICD-10-CM | POA: Diagnosis not present

## 2024-05-23 DIAGNOSIS — Z7901 Long term (current) use of anticoagulants: Secondary | ICD-10-CM | POA: Diagnosis not present

## 2024-05-23 DIAGNOSIS — D75839 Thrombocytosis, unspecified: Secondary | ICD-10-CM | POA: Insufficient documentation

## 2024-05-23 DIAGNOSIS — Z86711 Personal history of pulmonary embolism: Secondary | ICD-10-CM | POA: Diagnosis not present

## 2024-05-23 DIAGNOSIS — E039 Hypothyroidism, unspecified: Secondary | ICD-10-CM | POA: Diagnosis not present

## 2024-05-23 DIAGNOSIS — Z79899 Other long term (current) drug therapy: Secondary | ICD-10-CM | POA: Diagnosis not present

## 2024-05-23 DIAGNOSIS — Z7984 Long term (current) use of oral hypoglycemic drugs: Secondary | ICD-10-CM | POA: Diagnosis not present

## 2024-05-23 DIAGNOSIS — N183 Chronic kidney disease, stage 3 unspecified: Secondary | ICD-10-CM | POA: Diagnosis not present

## 2024-05-23 DIAGNOSIS — I129 Hypertensive chronic kidney disease with stage 1 through stage 4 chronic kidney disease, or unspecified chronic kidney disease: Secondary | ICD-10-CM | POA: Diagnosis not present

## 2024-05-23 DIAGNOSIS — Z8546 Personal history of malignant neoplasm of prostate: Secondary | ICD-10-CM

## 2024-05-23 DIAGNOSIS — N1832 Chronic kidney disease, stage 3b: Secondary | ICD-10-CM

## 2024-05-23 LAB — COMPREHENSIVE METABOLIC PANEL WITH GFR
ALT: 22 U/L (ref 0–44)
AST: 22 U/L (ref 15–41)
Albumin: 3.5 g/dL (ref 3.5–5.0)
Alkaline Phosphatase: 48 U/L (ref 38–126)
Anion gap: 8 (ref 5–15)
BUN: 20 mg/dL (ref 8–23)
CO2: 24 mmol/L (ref 22–32)
Calcium: 8.7 mg/dL — ABNORMAL LOW (ref 8.9–10.3)
Chloride: 107 mmol/L (ref 98–111)
Creatinine, Ser: 1.21 mg/dL (ref 0.61–1.24)
GFR, Estimated: >60 mL/min (ref >60)
Glucose, Bld: 120 mg/dL — ABNORMAL HIGH (ref 70–99)
Potassium: 4.6 mmol/L (ref 3.5–5.1)
Sodium: 139 mmol/L (ref 135–145)
Total Bilirubin: 0.9 mg/dL (ref 0.0–1.2)
Total Protein: 6.4 g/dL — ABNORMAL LOW (ref 6.5–8.1)

## 2024-05-23 LAB — CBC WITH DIFFERENTIAL/PLATELET
Abs Immature Granulocytes: 0.03 K/uL (ref 0.00–0.07)
Basophils Absolute: 0.1 K/uL (ref 0.0–0.1)
Basophils Relative: 1 %
Eosinophils Absolute: 0.2 K/uL (ref 0.0–0.5)
Eosinophils Relative: 2 %
HCT: 47.4 % (ref 39.0–52.0)
Hemoglobin: 15.2 g/dL (ref 13.0–17.0)
Immature Granulocytes: 0 %
Lymphocytes Relative: 17 %
Lymphs Abs: 1.3 K/uL (ref 0.7–4.0)
MCH: 34 pg (ref 26.0–34.0)
MCHC: 32.1 g/dL (ref 30.0–36.0)
MCV: 106 fL — ABNORMAL HIGH (ref 80.0–100.0)
Monocytes Absolute: 0.4 K/uL (ref 0.1–1.0)
Monocytes Relative: 6 %
Neutro Abs: 5.8 K/uL (ref 1.7–7.7)
Neutrophils Relative %: 74 %
Platelets: 303 K/uL (ref 150–400)
RBC: 4.47 MIL/uL (ref 4.22–5.81)
RDW: 14.6 % (ref 11.5–15.5)
WBC: 7.8 K/uL (ref 4.0–10.5)
nRBC: 0 % (ref 0.0–0.2)

## 2024-05-23 MED ORDER — HYDROXYUREA 500 MG PO CAPS: 500.0000 mg | ORAL_CAPSULE | ORAL | 1 refills | Status: DC

## 2024-05-23 NOTE — Assessment & Plan Note (Signed)
Non invasive bladder cancer s/p  tx'd with TURBT in 2020 and then BCG, follow up with urology 

## 2024-05-23 NOTE — Assessment & Plan Note (Signed)
 Lab Results  Component Value Date   HGB 15.2 05/23/2024   TIBC 294 02/20/2024   IRONPCTSAT 18 02/20/2024   FERRITIN 25 02/20/2024  Stable hemoglobin and iron  store.  In the context of his CKD, I recommend patient to continue Vitron C 2-3 times per week.

## 2024-05-23 NOTE — Assessment & Plan Note (Addendum)
 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 He tolerates hydroxyurea  well. Platelet is stable Adjust current regimen to Hydroxyurea  500mg  daily for 6 days per week.

## 2024-05-23 NOTE — Assessment & Plan Note (Signed)
 Recurrent DVT and embolism, s/p IVC filter, s/p IVC filter removal  Continue long term anticoagulation with Eliquis  5mg  BID [ patient gets refills through PCP/vascular surgeon's office]. Also on Plavix .

## 2024-05-23 NOTE — Progress Notes (Signed)
 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 He tolerates hydroxyurea  well. Platelet is stable Adjust current regimen to Hydroxyurea  500mg  daily for 6 days per week.   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 Also on Plavix .    Iron  deficiency anemia Lab Results  Component Value Date   HGB 15.2 05/23/2024   TIBC 294 02/20/2024   IRONPCTSAT 18 02/20/2024   FERRITIN 25 02/20/2024  Stable hemoglobin and iron  store.  In the context of his CKD, I recommend patient to continue Vitron C 2-3 times per week.   Orders Placed This Encounter  Procedures   CMP (Cancer Center only)    Standing Status:   Future    Expected Date:   08/22/2024    Expiration Date:   11/20/2024   CBC with Differential (Cancer Center Only)    Standing Status:   Future    Expected Date:   08/22/2024    Expiration Date:   11/20/2024   Follow up  3 months lab Phillip [name]  All questions were answered. The patient knows to call the clinic with any problems, questions or concerns.  Phillip Cap, Phillip [name], Phillip [name] [facility] Hematology Oncology 05/23/2024      Pertinent hematology oncology history   Patient has significant history of previous DVT and PE.   Patient was seen by me in March 2020 and declined further follow-up. 2013 history of prostate cancer treated with cryo therapy  01/03/2016 history of superficial venous thrombosis demonstrated in the greater saphenous vein-.  Also history of superficial thrombophlebitis of segment  of left the basal leg vein in the  upper arm-08/24/2017.  No anticoagulation was recommended at that time due to the SVT features. 11/25/2018, right lower extremity acute DVT, started on Lovenox  for about a week, 12/05/2018, patient underwent TURBT and intravesical instillation of gemcitabine  for noninvasive low-grade papillary urothelial carcinoma.  Lovenox  was held for 2 days prior and for 24 hours after procedure, and also started on Eliquis  12/15/2018, developed acute nonocclusive segmental and subsegmental pulmonary emboli within the right upper lobe, right middle lobe, right lower lobe pulmonary branches.  Patient was admitted to the hospital and started on heparin  drip.  Patient was seen by me during his admission at that time.  Lovenox  1 mg/kg twice daily was recommended at discharge.  He declined further follow-up with me in the clinic.   His anticoagulation was managed by primary care provider Dr. Cleotilde.  Patient was on Coumadin  for anticoagulation until Feb 2023.  Patient did not check INR since September 2022.  Patient also developed supratherapeutic INR due to interaction between Coumadin  and Diflucan for treatment of esophageal candidiasis.  Patient was off Coumadin  on 11/17/2021.  11/21/2021 developed acute nonocclusive DVT, started on Lovenox  decision was made to switch to Eliquis .   03/09/2022, artificial urinary sphincter placement.  His Eliquis  was held for 1 week prior to the procedure.  He has noticed left lower extremity pain 1 to 2 days after holding Eliquis . 03/11/2022, ER visit due to progressively worsening left lower extremity pain. CT angio aortobifemoral with and without contrast  showed eccentric thrombus in the left outflow vessels with distal occlusion of left renal vessels.  Delayed filling of the right popliteal artery is concerning for underlying occlusions and thromboembolic disease in the right runoff vessels Started on heparin  drip in ED. 03/11/2022, vascular surgeon lower extremity bypass surgery 03/12/2022,  severe left lower extremity pain, return to the OR for left lower extremity thrombectomy.  Continued on heparin  drip. 03/16/2022, left lower extremity extremity nonsalvageable.  Status post BKA.  Per my discussion with pharmacy, heparin  was held around 9 AM on 03/16/2022 and resumed on 03/17/2022 9:30 PM. . 03/17/2022, acute respiratory failure, CT chest angiogram showed bilateral central pulm emboli involving the main pulmonary arteries and extending into all lobar branches with nearly occlusive clot to the right lower lobe, left upper lobe, several segmental branches.  CT evidence of right heart strain.Mildly increased sclerotic focus    03/18/2022, thrombolysis, mechanical thrombectomy.  Heparin  gtt restarted after procedure. 03/19/2022, heparin  gtt was continued. 03/19/2022, bilateral lower extremity ultrasound showed thrombosis of right femoral popliteal and calf veins.  Partially thrombosed left profunda femoris vein. 03/19/2022 IVC filter placement. 03/20/2022, still have significant hypoxia.  On heparin  gtt. 03/21/2022 -03/22/2022, on  heparin  gtt. 03/23/2022, CT angio chest.showed increased clot burden within right pulmonary artery and the right lower lobe pulmonary emboli with similar clot burden in the right middle lobe, right upper lobe, left lower lobe.  Single residual fibrin strand/clot at the bifurcation of the pulmonary artery with decreased clot burden in the left upper lobe artery branches.  Persistent right heart strain.  Findings suspicious for developing pulmonary infarcts. 03/23/2022, mechanical embolectomy of pulmonary arteries as well as right external iliac vein, common iliac vein and inferior vena cava.   Given that patient developed recurrent PE/DVT despite being therapeutic on heparin  GTT, post thrombectomy, patient was switched to Angiomax  and later started on Argatroban  drip  Patient had  hypercoagulable work-up done during the admission. Work-up showed negative factor V Leiden mutation,  prothrombin gene imaging.  Negative anticardiolipin antibodies, lupus anticoagulants.  Negative beta-2  glycoprotein antibodies Positive JAK2 V6 50F mutation Patient was discharged on Eliquis .     06/01/22 Bone marrow biopsy showed  BONE MARROW, ASPIRATE, CLOT, CORE:  -Hypercellular marrow (60%) involved by a JAK2 mutated myeloproliferative neoplasm (see comment) Comment: The overall findings are consistent with a myeloproliferative neoplasm. Diagnostic considerations include essential thrombocythemia, polycythemia vera and pre- fibrotic primary myelofibrosis. Importantly, there is no overt increase in blasts or fibrosis.  However, correlation with erythropoietin levels, iron  studies, presence/absence of splenomegaly and serum LDH along with cytogenetic studies would be  helpful for a more definitive assessment.   PERIPHERAL BLOOD:   -Thrombocytosis  - Mild leukocytosis  - Normocytic normochromic anemia   Cytogenetics normal  Status post IVC filter removal.   INTERVAL HISTORY Phillip [name] is a 78 y.o. male who has above history reviewed by me today presents for follow up visit for  recurrent thrombosis, Jak2 V650F mutation myeloproliferative disease  Patient tolerates Hydroxyurea  500 mg daily,  6 days/week.SABRA He wears left lower extremity prosthetics. He is active, plays golf.  Patient is on anticoagulation with Eliquis  and Plavix .  Tolerates well.  No bleeding events.  MEDICAL HISTORY:  Past Medical History:  Diagnosis Date   Actinic keratosis    Arthritis    Basal cell carcinoma    L clavicle, L prox forearm- removed years ago    Bladder cancer [facility])    Bladder neck contracture    CAD (coronary  artery disease)    a. 12/2020 NSTEMI/Cath: LM nl, LAD 95ost/p, LCX 50p, RCA nl. EF 45-50%.   Cataract    CKD (chronic kidney disease), stage III [facility])    ED (erectile dysfunction)    Frequency    GERD (gastroesophageal reflux disease)    History of pulmonary embolus (PE)  11/2018   a. Following LE DVT-->Chronic warfarin.   Hyperlipidemia LDL goal <70    Hypertension    Hypothyroidism    Incontinence of urine    sui, s/p cryoablation   Ischemic cardiomyopathy    a. 11/2018 Echo: EF 60-65%; b. 12/2020 LV Gram: EF 45-50% in setting of NSTEMI.   Kidney stones    Neuropathy    feet   Nocturia    Prostate cancer (HCC)    S/P   CRYOABLATION   Right elbow tendinitis    Right Lower Extremity DVT (deep venous thrombosis) (HCC) 11/25/2018   Vertigo    1-2x/yr   Wears glasses     SURGICAL HISTORY: Past Surgical History:  Procedure Laterality Date   AMPUTATION Left 03/16/2022   Procedure: AMPUTATION BELOW KNEE;  Surgeon: Jama Cordella MATSU, Phillip [name];  Location: [facility] ORS;  Service: Vascular;  Laterality: Left;   ARTHROPLASTY AND TENDON REPAIR LEFT THUMB  JAN 2014   CATARACT EXTRACTION W/PHACO Left 11/16/2020   Procedure: CATARACT EXTRACTION PHACO AND INTRAOCULAR LENS PLACEMENT (IOC) LEFT  7.09 00:56.4 12.6%;  Surgeon: Mittie Gaskin, Phillip [name];  Location: [facility];  Service: Ophthalmology;  Laterality: Left;   CATARACT EXTRACTION W/PHACO Right 12/02/2020   Procedure: CATARACT EXTRACTION PHACO AND INTRAOCULAR LENS PLACEMENT (IOC) RIGHT MALYUGIN 5.52 00:49.7 11.1%;  Surgeon: Mittie Gaskin, Phillip [name];  Location: [facility];  Service: Ophthalmology;  Laterality: Right;   COLONOSCOPY     COLONOSCOPY WITH PROPOFOL  N/A 08/09/2017   Procedure: COLONOSCOPY WITH PROPOFOL ;  Surgeon: Viktoria Lamar DASEN, Phillip [name];  Location: [facility]COPY;  Service: Endoscopy;  Laterality: N/A;   CORONARY ANGIOGRAPHY N/A 12/28/2020   Procedure: CORONARY ANGIOGRAPHY;  Surgeon: Wonda Sharper, Phillip [name];  Location: [facility] INVASIVE CV LAB;  Service: Cardiovascular;  Laterality: N/A;   CORONARY STENT INTERVENTION N/A 12/28/2020   Procedure: CORONARY STENT INTERVENTION;  Surgeon: Wonda Sharper, Phillip [name];  Location: [facility] INVASIVE CV LAB;  Service: Cardiovascular;  Laterality: N/A;   ESOPHAGOGASTRODUODENOSCOPY  (EGD) WITH PROPOFOL  N/A 08/09/2017   Procedure: ESOPHAGOGASTRODUODENOSCOPY (EGD) WITH PROPOFOL ;  Surgeon: Viktoria Lamar DASEN, Phillip [name];  Location: [facility] ENDOSCOPY;  Service: Endoscopy;  Laterality: N/A;   GREEN LIGHT LASER TURP (TRANSURETHRAL RESECTION OF PROSTATE N/A 12/14/2015   Procedure: GREEN LIGHT LASER TURP (TRANSURETHRAL RESECTION OF PROSTATE) LASER OF BLADDER NECK CONTRACTURE;  Surgeon: Arlena Gal, Phillip [name];  Location: [facility];  Service: Urology;  Laterality: N/A;   IVC FILTER INSERTION N/A 03/19/2022   Procedure: IVC FILTER INSERTION;  Surgeon: Serene Gaile ORN, Phillip [name];  Location: [facility] INVASIVE CV LAB;  Service: Cardiovascular;  Laterality: N/A;   IVC FILTER REMOVAL N/A 11/01/2022   Procedure: IVC FILTER REMOVAL;  Surgeon: Jama Cordella MATSU, Phillip [name];  Location: [facility] INVASIVE CV LAB;  Service: Cardiovascular;  Laterality: N/A;   LEFT HEART CATH AND CORONARY ANGIOGRAPHY N/A 12/24/2020   Procedure: LEFT HEART CATH AND CORONARY ANGIOGRAPHY and possible PCI and stent;  Surgeon: Florencio Cara BIRCH, Phillip [name];  Location: [facility] INVASIVE CV LAB;  Service: Cardiovascular;  Laterality: N/A;   LEFT HEART CATH AND CORONARY ANGIOGRAPHY N/A 01/06/2021   Procedure: LEFT HEART CATH AND CORONARY ANGIOGRAPHY;  Surgeon: Burnard Debby LABOR, Phillip [name];  Location: [facility] INVASIVE  CV LAB;  Service: Cardiovascular;  Laterality: N/A;   LOWER EXTREMITY ANGIOGRAPHY Left 03/11/2022   Procedure: Lower Extremity Angiography;  Surgeon: Jama Cordella MATSU, Phillip [name];  Location: [facility] INVASIVE CV LAB;  Service: Cardiovascular;  Laterality: Left;   PENILE PROSTHESIS IMPLANT N/A 12/06/2013   Procedure: PENILE PROTHESIS INFLATABLE;  Surgeon: Arlena LILLETTE Gal, Phillip [name];  Location: [facility];  Service: Urology;  Laterality: N/A;   PROSTATE CRYOABLATION  06-01-2012  [facility]   PULMONARY THROMBECTOMY N/A 03/18/2022   Procedure: PULMONARY THROMBECTOMY;  Surgeon: Marea Selinda RAMAN, Phillip [name];  Location: [facility] INVASIVE CV LAB;  Service: Cardiovascular;  Laterality:  N/A;   PULMONARY THROMBECTOMY Bilateral 03/23/2022   Procedure: PULMONARY THROMBECTOMY;  Surgeon: Jama Cordella MATSU, Phillip [name];  Location: [facility] INVASIVE CV LAB;  Service: Cardiovascular;  Laterality: Bilateral;   THROMBECTOMY ILIAC ARTERY Left 03/12/2022   Procedure: THROMBECTOMY LEG;  Surgeon: Laurence Redell CROME, Phillip [name];  Location: [facility] ORS;  Service: Vascular;  Laterality: Left;   TRANSURETHRAL RESECTION OF BLADDER NECK N/A 03/20/2015   Procedure: RELEASE BLADDER NECK CONTRACTURE  WITH WOLF BUTTON ELECTRODE ;  Surgeon: Arlena Gal, Phillip [name];  Location: [facility];  Service: Urology;  Laterality: N/A;   TRANSURETHRAL RESECTION OF BLADDER TUMOR N/A 12/05/2018   Procedure: TRANSURETHRAL RESECTION OF BLADDER TUMOR (TURBT)/CYSTOSCOPY/  INSTILLATION OF LEOCADIA;  Surgeon: Devere Lonni Righter, Phillip [name];  Location: [facility];  Service: Urology;  Laterality: N/A;   TRANSURETHRAL RESECTION OF BLADDER TUMOR N/A 01/15/2020   Procedure: TRANSURETHRAL RESECTION OF BLADDER TUMOR (TURBT)/ CYSTOSCOPY;  Surgeon: Devere Lonni Righter, Phillip [name];  Location: [facility];  Service: Urology;  Laterality: N/A;   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      SOCIAL HISTORY: Social History   Socioeconomic History   Marital status: Married    Spouse name: Taydon Nasworthy   Number of children: 3   Years of education: Not on file   Highest education level: Not on file  Occupational History   Not on file  Tobacco Use   Smoking status: Never    Passive exposure: Never   Smokeless tobacco: Never  Vaping Use   Vaping status: Never Used  Substance and Sexual Activity   Alcohol use: No   Drug use: No   Sexual activity: Not Currently    Birth control/protection: None  Other Topics Concern   Not on file  Social History Narrative   Lives locally with wife.  Exercises regularly - gym/golf.  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   3  sons: 1 is a Engineer, civil (consulting).    Social Drivers of Corporate investment banker Strain: Low Risk  (06/16/2023)   Received from [facility]   Overall Financial Resource Strain (CARDIA)    Difficulty of Paying Living Expenses: Not hard at all  Food Insecurity: No Food Insecurity (06/16/2023)   Received from [facility]   Hunger Vital Sign    Within the past 12 months, you worried that your food would run out before you got the money to buy more.: Never true    Within the past 12 months, the food you bought just didn't last and you didn't have money to get more.: Never true  Transportation Needs: No Transportation Needs (06/16/2023)   Received from [facility] - Transportation    In the past 12 months, has lack of transportation kept you from medical appointments  or from getting medications?: No    Lack of Transportation (Non-Medical): No  Physical Activity: Not on file  Stress: Not on file  Social Connections: Not on file  Intimate Partner Violence: Not on file    FAMILY HISTORY: Family History  Problem Relation Age of Onset   CAD Father 29    ALLERGIES:  is allergic to doxazosin.  MEDICATIONS:  Current Outpatient Medications  Medication Sig Dispense Refill   acetaminophen  (TYLENOL ) 500 MG tablet Take 500 mg by mouth every 6 (six) hours as needed.     Alpha-Lipoic Acid 600 MG TABS Take 1 tablet by mouth daily.     apixaban  (ELIQUIS ) 5 MG TABS tablet TAKE ONE TABLET BY MOUTH TWICE DAILY 60 tablet 2   ascorbic acid (VITAMIN C) 500 MG tablet Take 500 mg by mouth daily.     cetirizine (ZYRTEC) 10 MG tablet Take 10 mg by mouth daily.     clopidogrel  (PLAVIX ) 75 MG tablet Take 1 tablet (75 mg total) by mouth daily with breakfast. 90 tablet 3   co-enzyme Q-10 30 MG capsule Take 30 mg by mouth daily.     glipiZIDE (GLUCOTROL XL) 2.5 MG 24 hr tablet Take by mouth every other day.     IRON  CR PO Take by mouth. Once a week     meclizine  (ANTIVERT) 25 MG tablet Take 25 mg by mouth.     metoprolol  tartrate (LOPRESSOR ) 25 MG tablet Take 0.5 tablets (12.5 mg total) by mouth 2 (two) times daily. 60 tablet 0   Multiple Vitamin (MULTIVITAMIN) tablet Take 1 tablet by mouth daily.     nitroGLYCERIN  (NITROSTAT ) 0.4 MG SL tablet Place 1 tablet (0.4 mg total) under the tongue every 5 (five) minutes x 3 doses as needed for chest pain. 25 tablet 3   pantoprazole  (PROTONIX ) 40 MG tablet Take 1 tablet (40 mg total) by mouth daily. 90 tablet 3   pravastatin  (PRAVACHOL ) 40 MG tablet Take 40 mg by mouth daily.     tadalafil  (CIALIS ) 5 MG tablet Take 5 mg by mouth daily.     TRUE METRIX BLOOD GLUCOSE TEST test strip SMARTSIG:Via Meter     TRUEplus Lancets 33G MISC      Turmeric (QC TUMERIC COMPLEX PO) Take by mouth.     hydroxyurea  (HYDREA ) 500 MG capsule Take 1 capsule (500 mg total) by mouth See admin instructions. May take with food to minimize GI side effects.Take 500mg  daily 6 days per week. 90 capsule 1   levothyroxine  (SYNTHROID ) 50 MCG tablet Take 50 mcg by mouth. (Patient not taking: Reported on 05/23/2024)     No current facility-administered medications for this visit.    Review of Systems  Constitutional:  Negative for appetite change, chills, fatigue, fever and unexpected weight change.  HENT:   Negative for hearing loss and voice change.   Eyes:  Negative for eye problems and icterus.  Respiratory:  Negative for chest tightness, cough and shortness of breath.   Cardiovascular:  Negative for chest pain and leg swelling.  Gastrointestinal:  Negative for abdominal distention and abdominal pain.  Endocrine: Negative for hot flashes.  Genitourinary:  Negative for difficulty urinating, dysuria and frequency.   Musculoskeletal:  Negative for arthralgias.       Left BKA  Skin:  Negative for itching and rash.  Neurological:  Negative for light-headedness and numbness.  Hematological:  Negative for adenopathy. Does not bruise/bleed  easily.  Psychiatric/Behavioral:  Negative for confusion.  PHYSICAL EXAMINATION:  Vitals:   05/23/24 1015 05/23/24 1023  BP: (!) 151/70 (!) 145/69  Pulse: (!) 58   Resp: 18   Temp: (!) 96 F (35.6 C)   SpO2: 100%    Filed Weights   05/23/24 1015  Weight: 173 lb 6.4 oz (78.7 kg)    Physical Exam Constitutional:      General: He is not in acute distress. HENT:     Head: Normocephalic and atraumatic.  Eyes:     General: No scleral icterus. Cardiovascular:     Rate and Rhythm: Normal rate.  Pulmonary:     Effort: Pulmonary effort is normal. No respiratory distress.     Breath sounds: No wheezing.  Abdominal:     General: Bowel sounds are normal. There is no distension.     Palpations: Abdomen is soft.  Musculoskeletal:        General: Normal range of motion.     Cervical back: Normal range of motion and neck supple.     Comments: Status post left BKA, + left lower limb prosthetics.   Skin:    General: Skin is warm and dry.     Findings: No erythema or rash.  Neurological:     Mental Status: He is alert and oriented to person, place, and time. Mental status is at baseline.     Cranial Nerves: No cranial nerve deficit.     Coordination: Coordination normal.  Psychiatric:        Mood and Affect: Mood normal.     LABORATORY DATA:  I have reviewed the data as listed    Latest Ref Rng & Units 05/23/2024   10:04 AM 02/20/2024    3:18 PM 11/15/2023    9:48 AM  CBC  WBC 4.0 - 10.5 K/uL 7.8  8.0  6.9   Hemoglobin 13.0 - 17.0 g/dL 84.7  85.2  85.4   Hematocrit 39.0 - 52.0 % 47.4  45.2  44.5   Platelets 150 - 400 K/uL 303  291  287       Latest Ref Rng & Units 05/23/2024   10:04 AM 02/20/2024    3:19 PM 11/15/2023    9:48 AM  CMP  Glucose 70 - 99 mg/dL 879  90  876   BUN 8 - 23 mg/dL 20  18  22    Creatinine 0.61 - 1.24 mg/dL 8.78  8.73  8.86   Sodium 135 - 145 mmol/L 139  140  140   Potassium 3.5 - 5.1 mmol/L 4.6  4.0  3.8   Chloride 98 - 111 mmol/L 107  109  109    CO2 22 - 32 mmol/L 24  24  23    Calcium  8.9 - 10.3 mg/dL 8.7  8.5  8.9   Total Protein 6.5 - 8.1 g/dL 6.4  6.9  6.7   Total Bilirubin 0.0 - 1.2 mg/dL 0.9  0.9  0.9   Alkaline Phos 38 - 126 U/L 48  55  47   AST 15 - 41 U/L 22  22  23    ALT 0 - 44 U/L 22  21  22        RADIOGRAPHIC STUDIES: I have personally reviewed the radiological images as listed and agreed with the findings in the report. VAS US  ABI WITH/WO TBI Result Date: 04/03/2024  LOWER EXTREMITY DOPPLER STUDY Patient Name:  Phillip [name]  Date of Exam:   04/01/2024 Medical Rec #: [id number]  Accession #:    [id number] Date of Birth: 03/31/1946         Patient Gender: M Patient Age:   56 years Exam [facility] Procedure:      VAS US  ABI WITH/WO TBI Referring Phys: --------------------------------------------------------------------------------  High Risk Factors: Hypertension, hyperlipidemia, prior MI.  Vascular Interventions: Left below knee amputation 03/16/2022. Comparison Study: 09/2023 Performing Technologist: Jerel Croak RVT  Examination Guidelines: A complete evaluation includes at minimum, Doppler waveform signals and systolic blood pressure reading at the level of bilateral brachial, anterior tibial, and posterior tibial arteries, when vessel segments are accessible. Bilateral testing is considered an integral part of a complete examination. Photoelectric Plethysmograph (PPG) waveforms and toe systolic pressure readings are included as required and additional duplex testing as needed. Limited examinations for reoccurring indications may be performed as noted.  ABI Findings: +---------+------------------+-----+---------+--------+ Right    Rt Pressure (mmHg)IndexWaveform Comment  +---------+------------------+-----+---------+--------+ Brachial 152                                      +---------+------------------+-----+---------+--------+ PTA      174               1.14 triphasic          +---------+------------------+-----+---------+--------+ DP       168               1.11 biphasic          +---------+------------------+-----+---------+--------+ [place] [id number]               1.01 Normal            +---------+------------------+-----+---------+--------+ +--------+------------------+-----+--------+-------+ Left    Lt Pressure (mmHg)IndexWaveformComment +--------+------------------+-----+--------+-------+ [id number]                                    +--------+------------------+-----+--------+-------+ +-------+-----------+-----------+------------+------------+ ABI/TBIToday's ABIToday's TBIPrevious ABIPrevious TBI +-------+-----------+-----------+------------+------------+ Right  1.14       1.01       1.16        .96          +-------+-----------+-----------+------------+------------+ Left   bka                   bka                      +-------+-----------+-----------+------------+------------+ Right ABIs and TBIs appear essentially unchanged compared to prior study on 09/2023.  Summary: Right: Resting right ankle-brachial index is within normal range. The right toe-brachial index is normal. Left: BKA. *See table(s) above for measurements and observations.  Electronically signed by Cordella Shawl Phillip [name] on 04/03/2024 at 2:21:32 PM.    Final

## 2024-05-27 DIAGNOSIS — Z89512 Acquired absence of left leg below knee: Secondary | ICD-10-CM | POA: Diagnosis not present

## 2024-05-27 DIAGNOSIS — M654 Radial styloid tenosynovitis [de Quervain]: Secondary | ICD-10-CM | POA: Diagnosis not present

## 2024-05-29 DIAGNOSIS — I10 Essential (primary) hypertension: Secondary | ICD-10-CM | POA: Diagnosis not present

## 2024-05-29 DIAGNOSIS — K219 Gastro-esophageal reflux disease without esophagitis: Secondary | ICD-10-CM | POA: Diagnosis not present

## 2024-05-29 DIAGNOSIS — Z955 Presence of coronary angioplasty implant and graft: Secondary | ICD-10-CM | POA: Diagnosis not present

## 2024-05-29 DIAGNOSIS — C61 Malignant neoplasm of prostate: Secondary | ICD-10-CM | POA: Diagnosis not present

## 2024-05-29 DIAGNOSIS — Z86711 Personal history of pulmonary embolism: Secondary | ICD-10-CM | POA: Diagnosis not present

## 2024-05-29 DIAGNOSIS — E782 Mixed hyperlipidemia: Secondary | ICD-10-CM | POA: Diagnosis not present

## 2024-05-29 DIAGNOSIS — I480 Paroxysmal atrial fibrillation: Secondary | ICD-10-CM | POA: Diagnosis not present

## 2024-05-29 DIAGNOSIS — R42 Dizziness and giddiness: Secondary | ICD-10-CM | POA: Diagnosis not present

## 2024-05-29 DIAGNOSIS — G4733 Obstructive sleep apnea (adult) (pediatric): Secondary | ICD-10-CM | POA: Diagnosis not present

## 2024-05-29 DIAGNOSIS — E119 Type 2 diabetes mellitus without complications: Secondary | ICD-10-CM | POA: Diagnosis not present

## 2024-05-29 DIAGNOSIS — I2510 Atherosclerotic heart disease of native coronary artery without angina pectoris: Secondary | ICD-10-CM | POA: Diagnosis not present

## 2024-05-29 DIAGNOSIS — I471 Supraventricular tachycardia, unspecified: Secondary | ICD-10-CM | POA: Diagnosis not present

## 2024-06-05 DIAGNOSIS — G4719 Other hypersomnia: Secondary | ICD-10-CM | POA: Diagnosis not present

## 2024-06-05 DIAGNOSIS — R0683 Snoring: Secondary | ICD-10-CM | POA: Diagnosis not present

## 2024-06-05 DIAGNOSIS — G4733 Obstructive sleep apnea (adult) (pediatric): Secondary | ICD-10-CM | POA: Diagnosis not present

## 2024-06-27 DIAGNOSIS — G4733 Obstructive sleep apnea (adult) (pediatric): Secondary | ICD-10-CM | POA: Diagnosis not present

## 2024-06-27 DIAGNOSIS — H10503 Unspecified blepharoconjunctivitis, bilateral: Secondary | ICD-10-CM | POA: Diagnosis not present

## 2024-07-01 DIAGNOSIS — H10503 Unspecified blepharoconjunctivitis, bilateral: Secondary | ICD-10-CM | POA: Diagnosis not present

## 2024-07-01 DIAGNOSIS — Z961 Presence of intraocular lens: Secondary | ICD-10-CM | POA: Diagnosis not present

## 2024-07-12 DIAGNOSIS — H0259 Other disorders affecting eyelid function: Secondary | ICD-10-CM | POA: Diagnosis not present

## 2024-07-15 DIAGNOSIS — E1169 Type 2 diabetes mellitus with other specified complication: Secondary | ICD-10-CM | POA: Diagnosis not present

## 2024-07-15 DIAGNOSIS — D2261 Melanocytic nevi of right upper limb, including shoulder: Secondary | ICD-10-CM | POA: Diagnosis not present

## 2024-07-15 DIAGNOSIS — L570 Actinic keratosis: Secondary | ICD-10-CM | POA: Diagnosis not present

## 2024-07-15 DIAGNOSIS — E782 Mixed hyperlipidemia: Secondary | ICD-10-CM | POA: Diagnosis not present

## 2024-07-15 DIAGNOSIS — D2262 Melanocytic nevi of left upper limb, including shoulder: Secondary | ICD-10-CM | POA: Diagnosis not present

## 2024-07-15 DIAGNOSIS — Z85828 Personal history of other malignant neoplasm of skin: Secondary | ICD-10-CM | POA: Diagnosis not present

## 2024-07-15 DIAGNOSIS — Z125 Encounter for screening for malignant neoplasm of prostate: Secondary | ICD-10-CM | POA: Diagnosis not present

## 2024-07-15 DIAGNOSIS — D2272 Melanocytic nevi of left lower limb, including hip: Secondary | ICD-10-CM | POA: Diagnosis not present

## 2024-07-15 DIAGNOSIS — L565 Disseminated superficial actinic porokeratosis (DSAP): Secondary | ICD-10-CM | POA: Diagnosis not present

## 2024-07-15 DIAGNOSIS — D2271 Melanocytic nevi of right lower limb, including hip: Secondary | ICD-10-CM | POA: Diagnosis not present

## 2024-07-17 DIAGNOSIS — G4733 Obstructive sleep apnea (adult) (pediatric): Secondary | ICD-10-CM | POA: Diagnosis not present

## 2024-07-18 ENCOUNTER — Ambulatory Visit: Payer: HMO | Admitting: Dermatology

## 2024-07-18 ENCOUNTER — Ambulatory Visit: Admitting: Dermatology

## 2024-07-19 NOTE — Progress Notes (Signed)
 Phillip [name]                                          MRN: [id number]   07/19/2024   The [facility] Quality Team Specialist reviewed this patient medical record for the purposes of chart review for care gap closure. The following were reviewed: chart review for care gap closure-kidney health evaluation for diabetes:eGFR  and uACR.    [facility]BCI Quality Team

## 2024-07-23 DIAGNOSIS — I2699 Other pulmonary embolism without acute cor pulmonale: Secondary | ICD-10-CM | POA: Diagnosis not present

## 2024-07-23 DIAGNOSIS — E1169 Type 2 diabetes mellitus with other specified complication: Secondary | ICD-10-CM | POA: Diagnosis not present

## 2024-07-23 DIAGNOSIS — D6859 Other primary thrombophilia: Secondary | ICD-10-CM | POA: Diagnosis not present

## 2024-07-23 DIAGNOSIS — Z0000 Encounter for general adult medical examination without abnormal findings: Secondary | ICD-10-CM | POA: Diagnosis not present

## 2024-07-23 DIAGNOSIS — E782 Mixed hyperlipidemia: Secondary | ICD-10-CM | POA: Diagnosis not present

## 2024-07-23 DIAGNOSIS — G4733 Obstructive sleep apnea (adult) (pediatric): Secondary | ICD-10-CM | POA: Diagnosis not present

## 2024-07-23 DIAGNOSIS — C61 Malignant neoplasm of prostate: Secondary | ICD-10-CM | POA: Diagnosis not present

## 2024-08-05 DIAGNOSIS — G4733 Obstructive sleep apnea (adult) (pediatric): Secondary | ICD-10-CM | POA: Diagnosis not present

## 2024-08-09 DIAGNOSIS — J069 Acute upper respiratory infection, unspecified: Secondary | ICD-10-CM | POA: Diagnosis not present

## 2024-08-09 DIAGNOSIS — J029 Acute pharyngitis, unspecified: Secondary | ICD-10-CM | POA: Diagnosis not present

## 2024-08-09 DIAGNOSIS — R0981 Nasal congestion: Secondary | ICD-10-CM | POA: Diagnosis not present

## 2024-08-13 DIAGNOSIS — J069 Acute upper respiratory infection, unspecified: Secondary | ICD-10-CM | POA: Diagnosis not present

## 2024-08-13 DIAGNOSIS — R058 Other specified cough: Secondary | ICD-10-CM | POA: Diagnosis not present

## 2024-08-28 ENCOUNTER — Ambulatory Visit: Admitting: Oncology

## 2024-08-28 ENCOUNTER — Inpatient Hospital Stay: Attending: Oncology

## 2024-08-28 DIAGNOSIS — Z86711 Personal history of pulmonary embolism: Secondary | ICD-10-CM | POA: Diagnosis not present

## 2024-08-28 DIAGNOSIS — Z86718 Personal history of other venous thrombosis and embolism: Secondary | ICD-10-CM | POA: Diagnosis not present

## 2024-08-28 DIAGNOSIS — D473 Essential (hemorrhagic) thrombocythemia: Secondary | ICD-10-CM

## 2024-08-28 DIAGNOSIS — Z7901 Long term (current) use of anticoagulants: Secondary | ICD-10-CM | POA: Diagnosis not present

## 2024-08-28 LAB — CMP (CANCER CENTER ONLY)
ALT: 21 U/L (ref 0–44)
AST: 21 U/L (ref 15–41)
Albumin: 3.9 g/dL (ref 3.5–5.0)
Alkaline Phosphatase: 61 U/L (ref 38–126)
Anion gap: 9 (ref 5–15)
BUN: 17 mg/dL (ref 8–23)
CO2: 26 mmol/L (ref 22–32)
Calcium: 9 mg/dL (ref 8.9–10.3)
Chloride: 106 mmol/L (ref 98–111)
Creatinine: 1.14 mg/dL (ref 0.61–1.24)
GFR, Estimated: >60 mL/min (ref >60)
Glucose, Bld: 157 mg/dL — ABNORMAL HIGH (ref 70–99)
Potassium: 4.1 mmol/L (ref 3.5–5.1)
Sodium: 141 mmol/L (ref 135–145)
Total Bilirubin: 0.5 mg/dL (ref 0.0–1.2)
Total Protein: 6.5 g/dL (ref 6.5–8.1)

## 2024-08-28 LAB — CBC WITH DIFFERENTIAL (CANCER CENTER ONLY)
Abs Immature Granulocytes: 0.08 K/uL — ABNORMAL HIGH (ref 0.00–0.07)
Basophils Absolute: 0.1 K/uL (ref 0.0–0.1)
Basophils Relative: 1 %
Eosinophils Absolute: 0.2 K/uL (ref 0.0–0.5)
Eosinophils Relative: 2 %
HCT: 50 % (ref 39.0–52.0)
Hemoglobin: 16.3 g/dL (ref 13.0–17.0)
Immature Granulocytes: 1 %
Lymphocytes Relative: 14 %
Lymphs Abs: 1.5 K/uL (ref 0.7–4.0)
MCH: 34.5 pg — ABNORMAL HIGH (ref 26.0–34.0)
MCHC: 32.6 g/dL (ref 30.0–36.0)
MCV: 105.9 fL — ABNORMAL HIGH (ref 80.0–100.0)
Monocytes Absolute: 0.7 K/uL (ref 0.1–1.0)
Monocytes Relative: 6 %
Neutro Abs: 8.7 K/uL — ABNORMAL HIGH (ref 1.7–7.7)
Neutrophils Relative %: 76 %
Platelet Count: 319 K/uL (ref 150–400)
RBC: 4.72 MIL/uL (ref 4.22–5.81)
RDW: 13.3 % (ref 11.5–15.5)
Smear Review: NORMAL
WBC Count: 11.3 K/uL — ABNORMAL HIGH (ref 4.0–10.5)
nRBC: 0 % (ref 0.0–0.2)

## 2024-08-30 ENCOUNTER — Encounter: Payer: Self-pay | Admitting: Oncology

## 2024-08-30 ENCOUNTER — Inpatient Hospital Stay: Admitting: Oncology

## 2024-08-30 VITALS — BP 142/81 | HR 65 | Temp 96.0°F | Resp 18 | Wt 181.4 lb

## 2024-08-30 DIAGNOSIS — D473 Essential (hemorrhagic) thrombocythemia: Secondary | ICD-10-CM | POA: Diagnosis not present

## 2024-08-30 DIAGNOSIS — D508 Other iron deficiency anemias: Secondary | ICD-10-CM | POA: Diagnosis not present

## 2024-08-30 DIAGNOSIS — I82409 Acute embolism and thrombosis of unspecified deep veins of unspecified lower extremity: Secondary | ICD-10-CM | POA: Diagnosis not present

## 2024-08-30 MED ORDER — HYDROXYUREA 500 MG PO CAPS: 500.0000 mg | ORAL_CAPSULE | Freq: Every day | ORAL | 1 refills | Status: AC

## 2024-08-30 NOTE — Progress Notes (Signed)
 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 He tolerates hydroxyurea  well. Platelet is stable Adjust current regimen to Hydroxyurea  500mg  daily for 6 days per week.   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 Also on Plavix .    Iron  deficiency anemia Lab Results  Component Value Date   HGB 16.3 08/28/2024   TIBC 294 02/20/2024   IRONPCTSAT 18 02/20/2024   FERRITIN 25 02/20/2024  Stable hemoglobin and iron  store.  In the context of his CKD, I recommend patient to continue Vitron C 2-3 times per week.   Orders Placed This Encounter  Procedures   CBC with Differential (Cancer Center Only)    Standing Status:   Future    Expected Date:   12/29/2024    Expiration Date:   03/29/2025   CMP (Cancer Center only)    Standing Status:   Future    Expected Date:   12/29/2024    Expiration Date:   03/29/2025   Follow up  4  months lab MD  All questions were answered. The patient knows to call the clinic with any problems, questions or concerns.  Phillip Cap, MD, PhD [facility] Hematology Oncology 08/30/2024      Pertinent hematology oncology history   Patient has significant history of previous DVT and PE.   Patient was seen by me in March 2020 and declined further follow-up. 2013 history of prostate cancer treated with cryo therapy  01/03/2016 history of superficial venous thrombosis demonstrated in the greater saphenous vein-.  Also history of superficial thrombophlebitis of segment  of left the basal leg vein in the  upper arm-08/24/2017.  No anticoagulation was recommended at that time due to the SVT features. 11/25/2018, right lower extremity acute DVT, started on Lovenox  for about a week, 12/05/2018, patient underwent TURBT and intravesical instillation of gemcitabine  for noninvasive low-grade papillary urothelial carcinoma.  Lovenox  was held for 2 days prior and for 24 hours after procedure, and also started on Eliquis  12/15/2018, developed acute nonocclusive segmental and subsegmental pulmonary emboli within the right upper lobe, right middle lobe, right lower lobe pulmonary branches.  Patient was admitted to the hospital and started on heparin  drip.  Patient was seen by me during his admission at that time.  Lovenox  1 mg/kg twice daily was recommended at discharge.  He declined further follow-up with me in the clinic.   His anticoagulation was managed by primary care provider Dr. Cleotilde.  Patient was on Coumadin  for anticoagulation until Feb 2023.  Patient did not check INR since September 2022.  Patient also developed supratherapeutic INR due to interaction between Coumadin  and Diflucan for treatment of esophageal candidiasis.  Patient was off Coumadin  on 11/17/2021.  11/21/2021 developed acute nonocclusive DVT, started on Lovenox  decision was made to switch to Eliquis .   03/09/2022, artificial urinary sphincter placement.  His Eliquis  was held for 1 week prior to the procedure.  He has noticed left lower extremity pain 1 to 2 days after holding Eliquis . 03/11/2022, ER visit due to progressively worsening left lower extremity pain. CT angio aortobifemoral with and without  contrast showed eccentric thrombus in the left outflow vessels with distal occlusion of left renal vessels.  Delayed filling of the right popliteal artery is concerning for underlying occlusions and thromboembolic disease in the right runoff vessels Started on heparin  drip in ED. 03/11/2022, vascular surgeon lower extremity bypass surgery 03/12/2022,  severe left lower extremity pain, return to the OR for left lower extremity thrombectomy.  Continued on heparin  drip. 03/16/2022, left lower extremity extremity nonsalvageable.  Status post BKA.  Per my discussion with pharmacy, heparin  was held around 9 AM on 03/16/2022 and resumed on 03/17/2022 9:30 PM. . 03/17/2022, acute respiratory failure, CT chest angiogram showed bilateral central pulm emboli involving the main pulmonary arteries and extending into all lobar branches with nearly occlusive clot to the right lower lobe, left upper lobe, several segmental branches.  CT evidence of right heart strain.Mildly increased sclerotic focus    03/18/2022, thrombolysis, mechanical thrombectomy.  Heparin  gtt restarted after procedure. 03/19/2022, heparin  gtt was continued. 03/19/2022, bilateral lower extremity ultrasound showed thrombosis of right femoral popliteal and calf veins.  Partially thrombosed left profunda femoris vein. 03/19/2022 IVC filter placement. 03/20/2022, still have significant hypoxia.  On heparin  gtt. 03/21/2022 -03/22/2022, on  heparin  gtt. 03/23/2022, CT angio chest.showed increased clot burden within right pulmonary artery and the right lower lobe pulmonary emboli with similar clot burden in the right middle lobe, right upper lobe, left lower lobe.  Single residual fibrin strand/clot at the bifurcation of the pulmonary artery with decreased clot burden in the left upper lobe artery branches.  Persistent right heart strain.  Findings suspicious for developing pulmonary infarcts. 03/23/2022, mechanical embolectomy of pulmonary arteries as well as right external iliac vein, common iliac vein and inferior vena cava.   Given that patient developed recurrent PE/DVT despite being therapeutic on heparin  GTT, post thrombectomy, patient was switched to Angiomax  and later started on Argatroban  drip  Patient had  hypercoagulable work-up done during the admission. Work-up showed negative factor V Leiden mutation,  prothrombin gene imaging.  Negative anticardiolipin antibodies, lupus anticoagulants.  Negative beta-2  glycoprotein antibodies Positive JAK2 V6 57F mutation Patient was discharged on Eliquis .     06/01/22 Bone marrow biopsy showed  BONE MARROW, ASPIRATE, CLOT, CORE:  -Hypercellular marrow (60%) involved by a JAK2 mutated myeloproliferative neoplasm (see comment) Comment: The overall findings are consistent with a myeloproliferative neoplasm. Diagnostic considerations include essential thrombocythemia, polycythemia vera and pre- fibrotic primary myelofibrosis. Importantly, there is no overt increase in blasts or fibrosis.  However, correlation with erythropoietin levels, iron  studies, presence/absence of splenomegaly and serum LDH along with cytogenetic studies would be  helpful for a more definitive assessment.   PERIPHERAL BLOOD:   -Thrombocytosis  - Mild leukocytosis  - Normocytic normochromic anemia   Cytogenetics normal  Status post IVC filter removal.   INTERVAL HISTORY Phillip [name] is a 78 y.o. male who has above history reviewed by me today presents for follow up visit for  recurrent thrombosis, Jak2 V657F mutation myeloproliferative disease  Patient tolerates Hydroxyurea  500 mg daily,  6 days/week.SABRA He wears left lower extremity prosthetics.  Patient is on anticoagulation with Eliquis  and Plavix .  Tolerates well.  No bleeding events.  MEDICAL HISTORY:  Past Medical History:  Diagnosis Date   Actinic keratosis    Arthritis    Basal cell carcinoma    L clavicle, L prox forearm- removed years ago    Bladder cancer [facility])    Bladder neck contracture    CAD (coronary artery disease)  a. 12/2020 NSTEMI/Cath: LM nl, LAD 95ost/p, LCX 50p, RCA nl. EF 45-50%.   Cataract    CKD (chronic kidney disease), stage III [facility])    ED (erectile dysfunction)    Frequency    GERD (gastroesophageal reflux disease)    History of pulmonary embolus (PE) 11/2018   a. Following LE  DVT-->Chronic warfarin.   Hyperlipidemia LDL goal <70    Hypertension    Hypothyroidism    Incontinence of urine    sui, s/p cryoablation   Ischemic cardiomyopathy    a. 11/2018 Echo: EF 60-65%; b. 12/2020 LV Gram: EF 45-50% in setting of NSTEMI.   Kidney stones    Neuropathy    feet   Nocturia    Prostate cancer (HCC)    S/P   CRYOABLATION   Right elbow tendinitis    Right Lower Extremity DVT (deep venous thrombosis) (HCC) 11/25/2018   Vertigo    1-2x/yr   Wears glasses     SURGICAL HISTORY: Past Surgical History:  Procedure Laterality Date   AMPUTATION Left 03/16/2022   Procedure: AMPUTATION BELOW KNEE;  Surgeon: Jama Cordella MATSU, MD;  Location: [facility] ORS;  Service: Vascular;  Laterality: Left;   ARTHROPLASTY AND TENDON REPAIR LEFT THUMB  JAN 2014   CATARACT EXTRACTION W/PHACO Left 11/16/2020   Procedure: CATARACT EXTRACTION PHACO AND INTRAOCULAR LENS PLACEMENT (IOC) LEFT  7.09 00:56.4 12.6%;  Surgeon: Mittie Gaskin, MD;  Location: [facility];  Service: Ophthalmology;  Laterality: Left;   CATARACT EXTRACTION W/PHACO Right 12/02/2020   Procedure: CATARACT EXTRACTION PHACO AND INTRAOCULAR LENS PLACEMENT (IOC) RIGHT MALYUGIN 5.52 00:49.7 11.1%;  Surgeon: Mittie Gaskin, MD;  Location: [facility];  Service: Ophthalmology;  Laterality: Right;   COLONOSCOPY     COLONOSCOPY WITH PROPOFOL  N/A 08/09/2017   Procedure: COLONOSCOPY WITH PROPOFOL ;  Surgeon: Viktoria Lamar DASEN, MD;  Location: [facility] ENDOSCOPY;  Service: Endoscopy;  Laterality: N/A;   CORONARY ANGIOGRAPHY N/A 12/28/2020   Procedure: CORONARY ANGIOGRAPHY;  Surgeon: Wonda Sharper, MD;  Location: [facility] INVASIVE CV LAB;  Service: Cardiovascular;  Laterality: N/A;   CORONARY STENT INTERVENTION N/A 12/28/2020   Procedure: CORONARY STENT INTERVENTION;  Surgeon: Wonda Sharper, MD;  Location: [facility] INVASIVE CV LAB;  Service: Cardiovascular;  Laterality: N/A;   ESOPHAGOGASTRODUODENOSCOPY (EGD) WITH PROPOFOL  N/A  08/09/2017   Procedure: ESOPHAGOGASTRODUODENOSCOPY (EGD) WITH PROPOFOL ;  Surgeon: Viktoria Lamar DASEN, MD;  Location: [facility] ENDOSCOPY;  Service: Endoscopy;  Laterality: N/A;   GREEN LIGHT LASER TURP (TRANSURETHRAL RESECTION OF PROSTATE N/A 12/14/2015   Procedure: GREEN LIGHT LASER TURP (TRANSURETHRAL RESECTION OF PROSTATE) LASER OF BLADDER NECK CONTRACTURE;  Surgeon: Arlena Gal, MD;  Location: [facility] [place];  Service: Urology;  Laterality: N/A;   IVC FILTER INSERTION N/A 03/19/2022   Procedure: IVC FILTER INSERTION;  Surgeon: Serene Gaile ORN, MD;  Location: [facility] INVASIVE CV LAB;  Service: Cardiovascular;  Laterality: N/A;   IVC FILTER REMOVAL N/A 11/01/2022   Procedure: IVC FILTER REMOVAL;  Surgeon: Jama Cordella MATSU, MD;  Location: [facility] INVASIVE CV LAB;  Service: Cardiovascular;  Laterality: N/A;   LEFT HEART CATH AND CORONARY ANGIOGRAPHY N/A 12/24/2020   Procedure: LEFT HEART CATH AND CORONARY ANGIOGRAPHY and possible PCI and stent;  Surgeon: Florencio Cara BIRCH, MD;  Location: [facility] INVASIVE CV LAB;  Service: Cardiovascular;  Laterality: N/A;   LEFT HEART CATH AND CORONARY ANGIOGRAPHY N/A 01/06/2021   Procedure: LEFT HEART CATH AND CORONARY ANGIOGRAPHY;  Surgeon: Burnard Debby LABOR, MD;  Location: [facility] INVASIVE CV LAB;  Service: Cardiovascular;  Laterality: N/A;   LOWER EXTREMITY ANGIOGRAPHY Left 03/11/2022   Procedure: Lower Extremity Angiography;  Surgeon: Jama Cordella MATSU, MD;  Location: [facility] INVASIVE CV LAB;  Service: Cardiovascular;  Laterality: Left;   PENILE PROSTHESIS IMPLANT N/A 12/06/2013   Procedure: PENILE PROTHESIS INFLATABLE;  Surgeon: Arlena LILLETTE Gal, MD;  Location: [facility];  Service: Urology;  Laterality: N/A;   PROSTATE CRYOABLATION  06-01-2012  [facility]   PULMONARY THROMBECTOMY N/A 03/18/2022   Procedure: PULMONARY THROMBECTOMY;  Surgeon: Marea Selinda RAMAN, MD;  Location: [facility] INVASIVE CV LAB;  Service: Cardiovascular;  Laterality: N/A;   PULMONARY  THROMBECTOMY Bilateral 03/23/2022   Procedure: PULMONARY THROMBECTOMY;  Surgeon: Jama Cordella MATSU, MD;  Location: [facility] INVASIVE CV LAB;  Service: Cardiovascular;  Laterality: Bilateral;   THROMBECTOMY ILIAC ARTERY Left 03/12/2022   Procedure: THROMBECTOMY LEG;  Surgeon: Laurence Redell CROME, MD;  Location: [facility] ORS;  Service: Vascular;  Laterality: Left;   TRANSURETHRAL RESECTION OF BLADDER NECK N/A 03/20/2015   Procedure: RELEASE BLADDER NECK CONTRACTURE  WITH WOLF BUTTON ELECTRODE ;  Surgeon: Arlena Gal, MD;  Location: [facility] [place];  Service: Urology;  Laterality: N/A;   TRANSURETHRAL RESECTION OF BLADDER TUMOR N/A 12/05/2018   Procedure: TRANSURETHRAL RESECTION OF BLADDER TUMOR (TURBT)/CYSTOSCOPY/  INSTILLATION OF LEOCADIA;  Surgeon: Devere Lonni Righter, MD;  Location: [facility];  Service: Urology;  Laterality: N/A;   TRANSURETHRAL RESECTION OF BLADDER TUMOR N/A 01/15/2020   Procedure: TRANSURETHRAL RESECTION OF BLADDER TUMOR (TURBT)/ CYSTOSCOPY;  Surgeon: Devere Lonni Righter, MD;  Location: [facility];  Service: Urology;  Laterality: N/A;   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      SOCIAL HISTORY: Social History   Socioeconomic History   Marital status: Married    Spouse name: Davontae Prusinski   Number of children: 3   Years of education: Not on file   Highest education level: Not on file  Occupational History   Not on file  Tobacco Use   Smoking status: Never    Passive exposure: Never   Smokeless tobacco: Never  Vaping Use   Vaping status: Never Used  Substance and Sexual Activity   Alcohol use: No   Drug use: No   Sexual activity: Not Currently    Birth control/protection: None  Other Topics Concern   Not on file  Social History Narrative   Lives locally with wife.  Exercises regularly - gym/golf.  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   3 sons: 1 is a  engineer, civil (consulting).    Social Drivers of Health   Tobacco Use: Low Risk (08/30/2024)   Patient History    Smoking Tobacco Use: Never    Smokeless Tobacco Use: Never    Passive Exposure: Never  Financial Resource Strain: Low Risk  (07/23/2024)   Received from [facility]   Overall Financial Resource Strain (CARDIA)    Difficulty of Paying Living Expenses: Not hard at all  Food Insecurity: No Food Insecurity (07/23/2024)   Received from [facility]   Epic    Within the past 12 months, you worried that your food would run out before you got the money to buy more.: Never true    Within the past 12 months, the food you bought just didn't last and you didn't have money to get more.: Never true  Transportation Needs: No Transportation Needs (07/23/2024)   Received from [facility]  PRAPARE - Transportation    In the past 12 months, has lack of transportation kept you from medical appointments or from getting medications?: No    Lack of Transportation (Non-Medical): No  Physical Activity: Not on file  Stress: Not on file  Social Connections: Not on file  Intimate Partner Violence: Not on file  Depression (EYV7-0): Not on file  Alcohol Screen: Not on file  Housing: Low Risk  (07/23/2024)   Received from [facility]   Epic    In the last 12 months, was there a time when you were not able to pay the mortgage or rent on time?: No    In the past 12 months, how many times have you moved where you were living?: 0    At any time in the past 12 months, were you homeless or living in a shelter (including now)?: No  Utilities: Not At Risk (07/23/2024)   Received from [facility]   Epic    In the past 12 months has the electric, gas, oil, or water  company threatened to shut off services in your home?: No  Health Literacy: Not on file    FAMILY HISTORY: Family History  Problem Relation Age of Onset   CAD Father 58     ALLERGIES:  is allergic to doxazosin.  MEDICATIONS:  Current Outpatient Medications  Medication Sig Dispense Refill   acetaminophen  (TYLENOL ) 500 MG tablet Take 500 mg by mouth every 6 (six) hours as needed.     Alpha-Lipoic Acid 600 MG TABS Take 1 tablet by mouth daily.     apixaban  (ELIQUIS ) 5 MG TABS tablet TAKE ONE TABLET BY MOUTH TWICE DAILY 60 tablet 2   ascorbic acid (VITAMIN C) 500 MG tablet Take 500 mg by mouth daily.     cetirizine (ZYRTEC) 10 MG tablet Take 10 mg by mouth daily.     clopidogrel  (PLAVIX ) 75 MG tablet Take 1 tablet (75 mg total) by mouth daily with breakfast. 90 tablet 3   co-enzyme Q-10 30 MG capsule Take 30 mg by mouth daily.     diltiazem (CARDIZEM CD) 120 MG 24 hr capsule Take 120 mg by mouth daily.     glipiZIDE (GLUCOTROL XL) 2.5 MG 24 hr tablet Take by mouth every other day.     IRON  CR PO Take by mouth. Once a week     Multiple Vitamin (MULTIVITAMIN) tablet Take 1 tablet by mouth daily.     nitroGLYCERIN  (NITROSTAT ) 0.4 MG SL tablet Place 1 tablet (0.4 mg total) under the tongue every 5 (five) minutes x 3 doses as needed for chest pain. 25 tablet 3   pantoprazole  (PROTONIX ) 40 MG tablet Take 1 tablet (40 mg total) by mouth daily. 90 tablet 3   pravastatin  (PRAVACHOL ) 40 MG tablet Take 40 mg by mouth daily.     tadalafil  (CIALIS ) 5 MG tablet Take 5 mg by mouth daily.     TRUE METRIX BLOOD GLUCOSE TEST test strip SMARTSIG:Via Meter     TRUEplus Lancets 33G MISC      Turmeric (QC TUMERIC COMPLEX PO) Take by mouth.     hydroxyurea  (HYDREA ) 500 MG capsule Take 1 capsule (500 mg total) by mouth daily. 90 capsule 1   levothyroxine  (SYNTHROID ) 50 MCG tablet Take 50 mcg by mouth. (Patient not taking: Reported on 08/30/2024)     metoprolol  tartrate (LOPRESSOR ) 25 MG tablet Take 0.5 tablets (12.5 mg total) by mouth 2 (two) times daily. (Patient not  taking: Reported on 08/30/2024) 60 tablet 0   No current facility-administered medications for this visit.     Review of Systems  Constitutional:  Negative for appetite change, chills, fatigue, fever and unexpected weight change.  HENT:   Negative for hearing loss and voice change.   Eyes:  Negative for eye problems and icterus.  Respiratory:  Negative for chest tightness, cough and shortness of breath.   Cardiovascular:  Negative for chest pain and leg swelling.  Gastrointestinal:  Negative for abdominal distention and abdominal pain.  Endocrine: Negative for hot flashes.  Genitourinary:  Negative for difficulty urinating, dysuria and frequency.   Musculoskeletal:  Negative for arthralgias.       Left BKA  Skin:  Negative for itching and rash.  Neurological:  Negative for light-headedness and numbness.  Hematological:  Negative for adenopathy. Does not bruise/bleed easily.  Psychiatric/Behavioral:  Negative for confusion.    PHYSICAL EXAMINATION:  Vitals:   08/30/24 1044  BP: (!) 142/81  Pulse: 65  Resp: 18  Temp: (!) 96 F (35.6 C)  SpO2: 99%   Filed Weights   08/30/24 1044  Weight: 181 lb 6.4 oz (82.3 kg)    Physical Exam Constitutional:      General: He is not in acute distress. HENT:     Head: Normocephalic and atraumatic.  Eyes:     General: No scleral icterus. Cardiovascular:     Rate and Rhythm: Normal rate.  Pulmonary:     Effort: Pulmonary effort is normal. No respiratory distress.     Breath sounds: No wheezing.  Abdominal:     General: Bowel sounds are normal. There is no distension.     Palpations: Abdomen is soft.  Musculoskeletal:        General: Normal range of motion.     Cervical back: Normal range of motion and neck supple.     Comments: Status post left BKA, + left lower limb prosthetics.   Skin:    General: Skin is warm and dry.     Findings: No erythema or rash.  Neurological:     Mental Status: He is alert and oriented to person, place, and time. Mental status is at baseline.     Cranial Nerves: No cranial nerve deficit.     Coordination:  Coordination normal.  Psychiatric:        Mood and Affect: Mood normal.     LABORATORY DATA:  I have reviewed the data as listed    Latest Ref Rng & Units 08/28/2024    9:20 AM 05/23/2024   10:04 AM 02/20/2024    3:18 PM  CBC  WBC 4.0 - 10.5 K/uL 11.3  7.8  8.0   Hemoglobin 13.0 - 17.0 g/dL 83.6  84.7  85.2   Hematocrit 39.0 - 52.0 % 50.0  47.4  45.2   Platelets 150 - 400 K/uL 319  303  291       Latest Ref Rng & Units 08/28/2024    9:20 AM 05/23/2024   10:04 AM 02/20/2024    3:19 PM  CMP  Glucose 70 - 99 mg/dL 842  879  90   BUN 8 - 23 mg/dL 17  20  18    Creatinine 0.61 - 1.24 mg/dL 8.85  8.78  8.73   Sodium 135 - 145 mmol/L 141  139  140   Potassium 3.5 - 5.1 mmol/L 4.1  4.6  4.0   Chloride 98 - 111 mmol/L 106  107  109  CO2 22 - 32 mmol/L 26  24  24    Calcium  8.9 - 10.3 mg/dL 9.0  8.7  8.5   Total Protein 6.5 - 8.1 g/dL 6.5  6.4  6.9   Total Bilirubin 0.0 - 1.2 mg/dL 0.5  0.9  0.9   Alkaline Phos 38 - 126 U/L 61  48  55   AST 15 - 41 U/L 21  22  22    ALT 0 - 44 U/L 21  22  21        RADIOGRAPHIC STUDIES: I have personally reviewed the radiological images as listed and agreed with the findings in the report. No results found.

## 2024-08-30 NOTE — Assessment & Plan Note (Addendum)
 Lab Results  Component Value Date   HGB 16.3 08/28/2024   TIBC 294 02/20/2024   IRONPCTSAT 18 02/20/2024   FERRITIN 25 02/20/2024  Stable hemoglobin and iron  store.  stop iron  supplementation

## 2024-08-30 NOTE — Assessment & Plan Note (Signed)
 Recurrent DVT and embolism, s/p IVC filter, s/p IVC filter removal  Continue long term anticoagulation with Eliquis  5mg  BID [ patient gets refills through PCP/vascular surgeon's office]. Also on Plavix .

## 2024-08-30 NOTE — Assessment & Plan Note (Addendum)
 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 He tolerates hydroxyurea  well. Platelet is stable Adjust current regimen to Hydroxyurea  500mg  daily .

## 2025-01-03 ENCOUNTER — Inpatient Hospital Stay

## 2025-01-03 ENCOUNTER — Inpatient Hospital Stay: Admitting: Oncology

## 2025-01-30 ENCOUNTER — Ambulatory Visit: Admitting: Urology

## 2025-03-31 ENCOUNTER — Ambulatory Visit (INDEPENDENT_AMBULATORY_CARE_PROVIDER_SITE_OTHER): Admitting: Vascular Surgery

## 2025-03-31 ENCOUNTER — Encounter (INDEPENDENT_AMBULATORY_CARE_PROVIDER_SITE_OTHER)
